# Patient Record
Sex: Male | Born: 1935
Health system: Southern US, Community
[De-identification: ages and names within clinical notes are randomized; demographics above are authoritative.]

## PROBLEM LIST (undated history)

## (undated) DIAGNOSIS — R001 Bradycardia, unspecified: Secondary | ICD-10-CM

## (undated) DIAGNOSIS — I498 Other specified cardiac arrhythmias: Secondary | ICD-10-CM

## (undated) DIAGNOSIS — R7303 Prediabetes: Secondary | ICD-10-CM

## (undated) DIAGNOSIS — K219 Gastro-esophageal reflux disease without esophagitis: Secondary | ICD-10-CM

## (undated) DIAGNOSIS — N401 Enlarged prostate with lower urinary tract symptoms: Secondary | ICD-10-CM

## (undated) DIAGNOSIS — C4491 Basal cell carcinoma of skin, unspecified: Secondary | ICD-10-CM

## (undated) DIAGNOSIS — R351 Nocturia: Secondary | ICD-10-CM

## (undated) DIAGNOSIS — Z950 Presence of cardiac pacemaker: Secondary | ICD-10-CM

## (undated) DIAGNOSIS — J180 Bronchopneumonia, unspecified organism: Secondary | ICD-10-CM

## (undated) DIAGNOSIS — N138 Other obstructive and reflux uropathy: Secondary | ICD-10-CM

## (undated) DIAGNOSIS — M5020 Other cervical disc displacement, unspecified cervical region: Secondary | ICD-10-CM

## (undated) DIAGNOSIS — R42 Dizziness and giddiness: Secondary | ICD-10-CM

## (undated) DIAGNOSIS — S32000A Wedge compression fracture of unspecified lumbar vertebra, initial encounter for closed fracture: Secondary | ICD-10-CM

## (undated) DIAGNOSIS — I4729 Other ventricular tachycardia: Secondary | ICD-10-CM

## (undated) DIAGNOSIS — R350 Frequency of micturition: Secondary | ICD-10-CM

## (undated) DIAGNOSIS — E8881 Metabolic syndrome: Secondary | ICD-10-CM

## (undated) DIAGNOSIS — I6523 Occlusion and stenosis of bilateral carotid arteries: Secondary | ICD-10-CM

## (undated) DIAGNOSIS — Z973 Presence of spectacles and contact lenses: Secondary | ICD-10-CM

## (undated) DIAGNOSIS — R5383 Other fatigue: Secondary | ICD-10-CM

## (undated) DIAGNOSIS — E039 Hypothyroidism, unspecified: Secondary | ICD-10-CM

## (undated) DIAGNOSIS — I495 Sick sinus syndrome: Secondary | ICD-10-CM

## (undated) DIAGNOSIS — I472 Ventricular tachycardia: Secondary | ICD-10-CM

## (undated) DIAGNOSIS — E785 Hyperlipidemia, unspecified: Secondary | ICD-10-CM

## (undated) DIAGNOSIS — R3915 Urgency of urination: Secondary | ICD-10-CM

## (undated) DIAGNOSIS — M1990 Unspecified osteoarthritis, unspecified site: Secondary | ICD-10-CM

## (undated) HISTORY — PX: ULNAR NERVE TRANSPOSITION: SHX2595

## (undated) HISTORY — PX: SKIN BIOPSY: SHX1

## (undated) HISTORY — PX: OTHER SURGICAL HISTORY: SHX169

## (undated) HISTORY — PX: BASAL CELL CARCINOMA EXCISION: SHX1214

## (undated) HISTORY — DX: Unspecified osteoarthritis, unspecified site: M19.90

## (undated) HISTORY — DX: Hyperlipidemia, unspecified: E78.5

---
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
## 1976-03-25 HISTORY — PX: LACERATION REPAIR: SHX5168

## 1998-04-06 ENCOUNTER — Ambulatory Visit (HOSPITAL_COMMUNITY): Admission: RE | Admit: 1998-04-06 | Discharge: 1998-04-06 | Payer: Self-pay | Admitting: Endocrinology

## 1998-04-06 ENCOUNTER — Encounter: Payer: Self-pay | Admitting: Endocrinology

## 2004-02-14 ENCOUNTER — Ambulatory Visit: Payer: Self-pay | Admitting: Internal Medicine

## 2004-02-21 ENCOUNTER — Ambulatory Visit: Payer: Self-pay | Admitting: Internal Medicine

## 2004-06-12 ENCOUNTER — Ambulatory Visit: Payer: Self-pay | Admitting: Internal Medicine

## 2004-06-19 ENCOUNTER — Ambulatory Visit: Payer: Self-pay | Admitting: Internal Medicine

## 2004-08-16 ENCOUNTER — Ambulatory Visit: Payer: Self-pay | Admitting: Internal Medicine

## 2004-08-23 ENCOUNTER — Ambulatory Visit: Payer: Self-pay | Admitting: Internal Medicine

## 2004-11-22 ENCOUNTER — Ambulatory Visit: Payer: Self-pay | Admitting: Internal Medicine

## 2005-02-05 ENCOUNTER — Ambulatory Visit: Payer: Self-pay | Admitting: Internal Medicine

## 2005-02-12 ENCOUNTER — Ambulatory Visit: Payer: Self-pay | Admitting: Internal Medicine

## 2005-02-15 ENCOUNTER — Ambulatory Visit: Payer: Self-pay | Admitting: Internal Medicine

## 2005-02-19 ENCOUNTER — Ambulatory Visit: Payer: Self-pay | Admitting: Internal Medicine

## 2005-03-06 ENCOUNTER — Ambulatory Visit: Payer: Self-pay | Admitting: Internal Medicine

## 2005-03-06 ENCOUNTER — Encounter (INDEPENDENT_AMBULATORY_CARE_PROVIDER_SITE_OTHER): Payer: Self-pay | Admitting: *Deleted

## 2005-07-31 ENCOUNTER — Ambulatory Visit: Payer: Self-pay | Admitting: Internal Medicine

## 2005-08-04 LAB — HM COLONOSCOPY

## 2005-08-06 ENCOUNTER — Ambulatory Visit: Payer: Self-pay | Admitting: Internal Medicine

## 2005-10-09 ENCOUNTER — Ambulatory Visit: Payer: Self-pay | Admitting: Internal Medicine

## 2006-01-09 ENCOUNTER — Ambulatory Visit: Payer: Self-pay | Admitting: Internal Medicine

## 2006-02-20 ENCOUNTER — Ambulatory Visit: Payer: Self-pay | Admitting: Internal Medicine

## 2006-02-20 LAB — CONVERTED CEMR LAB
ALT: 36 units/L (ref 0–40)
AST: 34 units/L (ref 0–37)
Albumin: 4.4 g/dL (ref 3.5–5.2)
Alkaline Phosphatase: 38 units/L — ABNORMAL LOW (ref 39–117)
BUN: 10 mg/dL (ref 6–23)
Basophils Absolute: 0 10*3/uL (ref 0.0–0.1)
Basophils Relative: 0.6 % (ref 0.0–1.0)
CO2: 28 meq/L (ref 19–32)
Calcium: 9.4 mg/dL (ref 8.4–10.5)
Chloride: 103 meq/L (ref 96–112)
Chol/HDL Ratio, serum: 3.4
Cholesterol: 127 mg/dL (ref 0–200)
Creatinine, Ser: 0.9 mg/dL (ref 0.4–1.5)
Eosinophil percent: 1.5 % (ref 0.0–5.0)
GFR calc non Af Amer: 89 mL/min
Glomerular Filtration Rate, Af Am: 108 mL/min/{1.73_m2}
Glucose, Bld: 108 mg/dL — ABNORMAL HIGH (ref 70–99)
HCT: 46.4 % (ref 39.0–52.0)
HDL: 37.3 mg/dL — ABNORMAL LOW (ref >39.0)
Hemoglobin: 15.8 g/dL (ref 13.0–17.0)
LDL Cholesterol: 73 mg/dL (ref 0–99)
Lymphocytes Relative: 29.1 % (ref 12.0–46.0)
MCHC: 34 g/dL (ref 30.0–36.0)
MCV: 102.6 fL — ABNORMAL HIGH (ref 78.0–100.0)
Monocytes Absolute: 0.6 10*3/uL (ref 0.2–0.7)
Monocytes Relative: 11 % (ref 3.0–11.0)
Neutro Abs: 3.1 10*3/uL (ref 1.4–7.7)
Neutrophils Relative %: 57.8 % (ref 43.0–77.0)
PSA: 0.83 ng/mL (ref 0.10–4.00)
Platelets: 187 10*3/uL (ref 150–400)
Potassium: 4.4 meq/L (ref 3.5–5.1)
RBC: 4.52 M/uL (ref 4.22–5.81)
RDW: 12.5 % (ref 11.5–14.6)
Sodium: 138 meq/L (ref 135–145)
TSH: 4.18 microintl units/mL (ref 0.35–5.50)
Total Bilirubin: 1 mg/dL (ref 0.3–1.2)
Total Protein: 6.8 g/dL (ref 6.0–8.3)
Triglyceride fasting, serum: 83 mg/dL (ref 0–149)
VLDL: 17 mg/dL (ref 0–40)
WBC: 5.3 10*3/uL (ref 4.5–10.5)

## 2006-02-27 ENCOUNTER — Ambulatory Visit: Payer: Self-pay | Admitting: Internal Medicine

## 2006-07-10 ENCOUNTER — Ambulatory Visit: Payer: Self-pay | Admitting: Internal Medicine

## 2006-07-10 LAB — CONVERTED CEMR LAB
ALT: 50 units/L — ABNORMAL HIGH (ref 0–40)
AST: 40 units/L — ABNORMAL HIGH (ref 0–37)
Albumin: 4.3 g/dL (ref 3.5–5.2)
Alkaline Phosphatase: 41 units/L (ref 39–117)
Bilirubin, Direct: 0.2 mg/dL (ref 0.0–0.3)
Cholesterol: 133 mg/dL (ref 0–200)
HDL: 37.3 mg/dL — ABNORMAL LOW (ref >39.0)
Hgb A1c MFr Bld: 5.7 % (ref 4.6–6.0)
LDL Cholesterol: 78 mg/dL (ref 0–99)
TSH: 2.63 microintl units/mL (ref 0.35–5.50)
Total Bilirubin: 1 mg/dL (ref 0.3–1.2)
Total CHOL/HDL Ratio: 3.6
Total Protein: 7.2 g/dL (ref 6.0–8.3)
Triglycerides: 88 mg/dL (ref 0–149)
VLDL: 18 mg/dL (ref 0–40)

## 2006-09-18 DIAGNOSIS — E785 Hyperlipidemia, unspecified: Secondary | ICD-10-CM | POA: Insufficient documentation

## 2006-09-18 DIAGNOSIS — N318 Other neuromuscular dysfunction of bladder: Secondary | ICD-10-CM | POA: Insufficient documentation

## 2006-10-07 ENCOUNTER — Ambulatory Visit: Payer: Self-pay | Admitting: Internal Medicine

## 2006-10-07 LAB — CONVERTED CEMR LAB
Creatinine,U: 101.2 mg/dL
Hgb A1c MFr Bld: 5.5 % (ref 4.6–6.0)
Microalb Creat Ratio: 4 mg/g (ref 0.0–30.0)
Microalb, Ur: 0.4 mg/dL (ref 0.0–1.9)

## 2006-10-14 ENCOUNTER — Ambulatory Visit: Payer: Self-pay | Admitting: Internal Medicine

## 2006-10-14 DIAGNOSIS — E039 Hypothyroidism, unspecified: Secondary | ICD-10-CM | POA: Insufficient documentation

## 2006-10-14 DIAGNOSIS — K648 Other hemorrhoids: Secondary | ICD-10-CM

## 2006-11-14 ENCOUNTER — Encounter: Payer: Self-pay | Admitting: Internal Medicine

## 2006-12-16 ENCOUNTER — Encounter: Payer: Self-pay | Admitting: Internal Medicine

## 2007-01-02 ENCOUNTER — Ambulatory Visit: Payer: Self-pay | Admitting: Internal Medicine

## 2007-02-03 ENCOUNTER — Ambulatory Visit: Payer: Self-pay | Admitting: Internal Medicine

## 2007-02-03 LAB — CONVERTED CEMR LAB
ALT: 42 units/L (ref 0–53)
AST: 34 units/L (ref 0–37)
Albumin: 4.2 g/dL (ref 3.5–5.2)
Alkaline Phosphatase: 37 units/L — ABNORMAL LOW (ref 39–117)
BUN: 9 mg/dL (ref 6–23)
Basophils Absolute: 0 10*3/uL (ref 0.0–0.1)
Basophils Relative: 0.5 % (ref 0.0–1.0)
Bilirubin, Direct: 0.3 mg/dL (ref 0.0–0.3)
Blood in Urine, dipstick: NEGATIVE
CO2: 29 meq/L (ref 19–32)
Calcium: 9.6 mg/dL (ref 8.4–10.5)
Chloride: 104 meq/L (ref 96–112)
Cholesterol: 108 mg/dL (ref 0–200)
Creatinine, Ser: 0.8 mg/dL (ref 0.4–1.5)
Eosinophils Absolute: 0.1 10*3/uL (ref 0.0–0.6)
Eosinophils Relative: 1.4 % (ref 0.0–5.0)
GFR calc Af Amer: 123 mL/min
GFR calc non Af Amer: 102 mL/min
Glucose, Bld: 111 mg/dL — ABNORMAL HIGH (ref 70–99)
Glucose, Urine, Semiquant: NEGATIVE
HCT: 43.7 % (ref 39.0–52.0)
HDL: 26.4 mg/dL — ABNORMAL LOW (ref >39.0)
Hemoglobin: 15.5 g/dL (ref 13.0–17.0)
Ketones, urine, test strip: NEGATIVE
LDL Cholesterol: 65 mg/dL (ref 0–99)
Lymphocytes Relative: 25.2 % (ref 12.0–46.0)
MCHC: 35.5 g/dL (ref 30.0–36.0)
MCV: 102 fL — ABNORMAL HIGH (ref 78.0–100.0)
Monocytes Absolute: 0.7 10*3/uL (ref 0.2–0.7)
Monocytes Relative: 11.5 % — ABNORMAL HIGH (ref 3.0–11.0)
Neutro Abs: 3.5 10*3/uL (ref 1.4–7.7)
Neutrophils Relative %: 61.4 % (ref 43.0–77.0)
Nitrite: NEGATIVE
PSA: 0.51 ng/mL (ref 0.10–4.00)
Platelets: 181 10*3/uL (ref 150–400)
Potassium: 4.8 meq/L (ref 3.5–5.1)
Protein, U semiquant: NEGATIVE
RBC: 4.29 M/uL (ref 4.22–5.81)
RDW: 12.1 % (ref 11.5–14.6)
Sodium: 140 meq/L (ref 135–145)
Specific Gravity, Urine: 1.02
TSH: 4.23 microintl units/mL (ref 0.35–5.50)
Total Bilirubin: 1.1 mg/dL (ref 0.3–1.2)
Total CHOL/HDL Ratio: 4.1
Total Protein: 6.9 g/dL (ref 6.0–8.3)
Triglycerides: 85 mg/dL (ref 0–149)
Urobilinogen, UA: 1
VLDL: 17 mg/dL (ref 0–40)
WBC Urine, dipstick: NEGATIVE
WBC: 5.7 10*3/uL (ref 4.5–10.5)
pH: 5.5

## 2007-02-10 ENCOUNTER — Ambulatory Visit: Payer: Self-pay | Admitting: Internal Medicine

## 2007-02-10 DIAGNOSIS — M545 Low back pain, unspecified: Secondary | ICD-10-CM | POA: Insufficient documentation

## 2007-02-10 DIAGNOSIS — F32A Depression, unspecified: Secondary | ICD-10-CM | POA: Insufficient documentation

## 2007-02-10 DIAGNOSIS — F329 Major depressive disorder, single episode, unspecified: Secondary | ICD-10-CM

## 2007-02-10 DIAGNOSIS — F419 Anxiety disorder, unspecified: Secondary | ICD-10-CM

## 2007-02-10 DIAGNOSIS — N401 Enlarged prostate with lower urinary tract symptoms: Secondary | ICD-10-CM | POA: Insufficient documentation

## 2007-02-10 DIAGNOSIS — N400 Enlarged prostate without lower urinary tract symptoms: Secondary | ICD-10-CM | POA: Insufficient documentation

## 2007-02-10 DIAGNOSIS — M1990 Unspecified osteoarthritis, unspecified site: Secondary | ICD-10-CM | POA: Insufficient documentation

## 2007-02-10 DIAGNOSIS — R7309 Other abnormal glucose: Secondary | ICD-10-CM | POA: Insufficient documentation

## 2007-04-15 ENCOUNTER — Encounter: Payer: Self-pay | Admitting: Internal Medicine

## 2007-04-23 ENCOUNTER — Ambulatory Visit: Payer: Self-pay | Admitting: Internal Medicine

## 2007-04-23 DIAGNOSIS — E291 Testicular hypofunction: Secondary | ICD-10-CM | POA: Insufficient documentation

## 2007-04-23 LAB — CONVERTED CEMR LAB
Cholesterol, target level: 200 mg/dL
HDL goal, serum: 40 mg/dL
LDL Goal: 130 mg/dL
Testosterone: 401.69 ng/dL (ref 350.00–890)

## 2007-06-18 ENCOUNTER — Ambulatory Visit: Payer: Self-pay | Admitting: Internal Medicine

## 2007-06-18 LAB — CONVERTED CEMR LAB: TSH: 3.44 microintl units/mL (ref 0.35–5.50)

## 2007-08-22 ENCOUNTER — Emergency Department (HOSPITAL_COMMUNITY): Admission: EM | Admit: 2007-08-22 | Discharge: 2007-08-22 | Payer: Self-pay | Admitting: Emergency Medicine

## 2007-08-24 ENCOUNTER — Ambulatory Visit: Payer: Self-pay | Admitting: Internal Medicine

## 2007-08-24 DIAGNOSIS — S022XXA Fracture of nasal bones, initial encounter for closed fracture: Secondary | ICD-10-CM | POA: Insufficient documentation

## 2007-08-25 ENCOUNTER — Telehealth (INDEPENDENT_AMBULATORY_CARE_PROVIDER_SITE_OTHER): Payer: Self-pay

## 2007-08-31 ENCOUNTER — Encounter: Payer: Self-pay | Admitting: Internal Medicine

## 2007-09-01 ENCOUNTER — Ambulatory Visit (HOSPITAL_BASED_OUTPATIENT_CLINIC_OR_DEPARTMENT_OTHER): Admission: RE | Admit: 2007-09-01 | Discharge: 2007-09-01 | Payer: Self-pay | Admitting: Otolaryngology

## 2007-09-01 HISTORY — PX: CLOSED REDUCTION NASAL FRACTURE: SUR256

## 2007-09-10 ENCOUNTER — Ambulatory Visit: Payer: Self-pay | Admitting: Internal Medicine

## 2007-09-10 LAB — CONVERTED CEMR LAB
ALT: 54 units/L — ABNORMAL HIGH (ref 0–53)
AST: 44 units/L — ABNORMAL HIGH (ref 0–37)
Albumin: 4 g/dL (ref 3.5–5.2)
Alkaline Phosphatase: 37 units/L — ABNORMAL LOW (ref 39–117)
BUN: 10 mg/dL (ref 6–23)
Bilirubin, Direct: 0.1 mg/dL (ref 0.0–0.3)
CO2: 27 meq/L (ref 19–32)
Calcium: 9.1 mg/dL (ref 8.4–10.5)
Chloride: 105 meq/L (ref 96–112)
Cholesterol: 111 mg/dL (ref 0–200)
Creatinine, Ser: 0.8 mg/dL (ref 0.4–1.5)
GFR calc Af Amer: 123 mL/min
GFR calc non Af Amer: 102 mL/min
Glucose, Bld: 116 mg/dL — ABNORMAL HIGH (ref 70–99)
HDL: 25.3 mg/dL — ABNORMAL LOW (ref >39.0)
LDL Cholesterol: 57 mg/dL (ref 0–99)
Potassium: 3.8 meq/L (ref 3.5–5.1)
Sodium: 140 meq/L (ref 135–145)
Total Bilirubin: 1 mg/dL (ref 0.3–1.2)
Total CHOL/HDL Ratio: 4.4
Total Protein: 6.6 g/dL (ref 6.0–8.3)
Triglycerides: 141 mg/dL (ref 0–149)
VLDL: 28 mg/dL (ref 0–40)

## 2007-09-17 ENCOUNTER — Ambulatory Visit: Payer: Self-pay | Admitting: Internal Medicine

## 2007-09-17 DIAGNOSIS — T887XXA Unspecified adverse effect of drug or medicament, initial encounter: Secondary | ICD-10-CM | POA: Insufficient documentation

## 2007-11-12 ENCOUNTER — Ambulatory Visit: Payer: Self-pay | Admitting: Internal Medicine

## 2007-11-12 LAB — CONVERTED CEMR LAB
ALT: 59 units/L — ABNORMAL HIGH (ref 0–53)
AST: 41 units/L — ABNORMAL HIGH (ref 0–37)
Albumin: 3.9 g/dL (ref 3.5–5.2)
Alkaline Phosphatase: 34 units/L — ABNORMAL LOW (ref 39–117)
Bilirubin, Direct: 0.2 mg/dL (ref 0.0–0.3)
Cholesterol: 86 mg/dL (ref 0–200)
HDL: 23.7 mg/dL — ABNORMAL LOW (ref >39.0)
LDL Cholesterol: 47 mg/dL (ref 0–99)
Total Bilirubin: 0.8 mg/dL (ref 0.3–1.2)
Total CHOL/HDL Ratio: 3.6
Total Protein: 6.4 g/dL (ref 6.0–8.3)
Triglycerides: 79 mg/dL (ref 0–149)
VLDL: 16 mg/dL (ref 0–40)

## 2007-11-19 ENCOUNTER — Ambulatory Visit: Payer: Self-pay | Admitting: Internal Medicine

## 2007-12-15 ENCOUNTER — Ambulatory Visit: Payer: Self-pay | Admitting: Internal Medicine

## 2007-12-17 ENCOUNTER — Encounter: Payer: Self-pay | Admitting: Internal Medicine

## 2008-01-25 ENCOUNTER — Ambulatory Visit: Payer: Self-pay | Admitting: Internal Medicine

## 2008-02-16 ENCOUNTER — Ambulatory Visit: Payer: Self-pay | Admitting: Internal Medicine

## 2008-02-16 LAB — CONVERTED CEMR LAB
ALT: 38 units/L (ref 0–53)
AST: 37 units/L (ref 0–37)
Albumin: 4.1 g/dL (ref 3.5–5.2)
Alkaline Phosphatase: 43 units/L (ref 39–117)
Bilirubin, Direct: 0.2 mg/dL (ref 0.0–0.3)
Cholesterol: 139 mg/dL (ref 0–200)
HDL: 29.9 mg/dL — ABNORMAL LOW (ref >39.0)
Hgb A1c MFr Bld: 5.6 % (ref 4.6–6.0)
LDL Cholesterol: 92 mg/dL (ref 0–99)
Total Bilirubin: 0.9 mg/dL (ref 0.3–1.2)
Total CHOL/HDL Ratio: 4.6
Total Protein: 6.7 g/dL (ref 6.0–8.3)
Triglycerides: 88 mg/dL (ref 0–149)
VLDL: 18 mg/dL (ref 0–40)

## 2008-02-23 ENCOUNTER — Ambulatory Visit: Payer: Self-pay | Admitting: Internal Medicine

## 2008-03-26 ENCOUNTER — Ambulatory Visit: Payer: Self-pay | Admitting: Internal Medicine

## 2008-03-26 ENCOUNTER — Telehealth: Payer: Self-pay | Admitting: Internal Medicine

## 2008-03-26 DIAGNOSIS — J302 Other seasonal allergic rhinitis: Secondary | ICD-10-CM | POA: Insufficient documentation

## 2008-03-26 DIAGNOSIS — J3089 Other allergic rhinitis: Secondary | ICD-10-CM

## 2008-09-08 ENCOUNTER — Ambulatory Visit: Payer: Self-pay | Admitting: Internal Medicine

## 2008-09-12 LAB — CONVERTED CEMR LAB
ALT: 43 units/L (ref 0–53)
AST: 39 units/L — ABNORMAL HIGH (ref 0–37)
Albumin: 4.3 g/dL (ref 3.5–5.2)
Alkaline Phosphatase: 33 units/L — ABNORMAL LOW (ref 39–117)
BUN: 14 mg/dL (ref 6–23)
Basophils Absolute: 0 10*3/uL (ref 0.0–0.1)
Basophils Relative: 0.4 % (ref 0.0–3.0)
Bilirubin, Direct: 0.1 mg/dL (ref 0.0–0.3)
CO2: 29 meq/L (ref 19–32)
Calcium: 9.2 mg/dL (ref 8.4–10.5)
Chloride: 104 meq/L (ref 96–112)
Cholesterol: 135 mg/dL (ref 0–200)
Creatinine, Ser: 0.8 mg/dL (ref 0.4–1.5)
Eosinophils Absolute: 0 10*3/uL (ref 0.0–0.7)
Eosinophils Relative: 0.7 % (ref 0.0–5.0)
GFR calc non Af Amer: 101.25 mL/min (ref >60)
Glucose, Bld: 104 mg/dL — ABNORMAL HIGH (ref 70–99)
HCT: 44.2 % (ref 39.0–52.0)
HDL: 33.9 mg/dL — ABNORMAL LOW (ref >39.00)
Hemoglobin: 15.2 g/dL (ref 13.0–17.0)
LDL Cholesterol: 79 mg/dL (ref 0–99)
Lymphocytes Relative: 22.3 % (ref 12.0–46.0)
Lymphs Abs: 1.3 10*3/uL (ref 0.7–4.0)
MCHC: 34.5 g/dL (ref 30.0–36.0)
MCV: 104.2 fL — ABNORMAL HIGH (ref 78.0–100.0)
Monocytes Absolute: 0.6 10*3/uL (ref 0.1–1.0)
Monocytes Relative: 10.3 % (ref 3.0–12.0)
Neutro Abs: 4.1 10*3/uL (ref 1.4–7.7)
Neutrophils Relative %: 66.3 % (ref 43.0–77.0)
PSA: 0.66 ng/mL (ref 0.10–4.00)
Platelets: 89 10*3/uL — ABNORMAL LOW (ref 150.0–400.0)
Potassium: 4 meq/L (ref 3.5–5.1)
RBC: 4.24 M/uL (ref 4.22–5.81)
RDW: 12.7 % (ref 11.5–14.6)
Sodium: 139 meq/L (ref 135–145)
TSH: 4.32 microintl units/mL (ref 0.35–5.50)
Testosterone: 371.18 ng/dL (ref 350.00–890.00)
Total Bilirubin: 1.3 mg/dL — ABNORMAL HIGH (ref 0.3–1.2)
Total CHOL/HDL Ratio: 4
Total Protein: 7.2 g/dL (ref 6.0–8.3)
Triglycerides: 109 mg/dL (ref 0.0–149.0)
VLDL: 21.8 mg/dL (ref 0.0–40.0)
WBC: 6 10*3/uL (ref 4.5–10.5)

## 2008-09-24 ENCOUNTER — Telehealth (INDEPENDENT_AMBULATORY_CARE_PROVIDER_SITE_OTHER): Payer: Self-pay | Admitting: *Deleted

## 2008-10-10 ENCOUNTER — Ambulatory Visit: Payer: Self-pay | Admitting: Internal Medicine

## 2008-10-10 DIAGNOSIS — E162 Hypoglycemia, unspecified: Secondary | ICD-10-CM | POA: Insufficient documentation

## 2009-01-11 ENCOUNTER — Ambulatory Visit: Payer: Self-pay | Admitting: Internal Medicine

## 2009-03-02 ENCOUNTER — Ambulatory Visit: Payer: Self-pay | Admitting: Internal Medicine

## 2009-03-02 LAB — CONVERTED CEMR LAB
ALT: 36 units/L (ref 0–53)
AST: 34 units/L (ref 0–37)
Albumin: 3.9 g/dL (ref 3.5–5.2)
Alkaline Phosphatase: 30 units/L — ABNORMAL LOW (ref 39–117)
Bilirubin, Direct: 0.1 mg/dL (ref 0.0–0.3)
Cholesterol: 116 mg/dL (ref 0–200)
HDL: 26 mg/dL — ABNORMAL LOW (ref >39.00)
LDL Cholesterol: 70 mg/dL (ref 0–99)
Total Bilirubin: 1.2 mg/dL (ref 0.3–1.2)
Total CHOL/HDL Ratio: 4
Total Protein: 7 g/dL (ref 6.0–8.3)
Triglycerides: 102 mg/dL (ref 0.0–149.0)
VLDL: 20.4 mg/dL (ref 0.0–40.0)

## 2009-03-22 ENCOUNTER — Ambulatory Visit: Payer: Self-pay | Admitting: Internal Medicine

## 2009-03-23 ENCOUNTER — Encounter: Payer: Self-pay | Admitting: Internal Medicine

## 2009-03-27 LAB — CONVERTED CEMR LAB
PSA, Free Pct: 40 (ref >25)
PSA, Free: 0.4 ng/mL
PSA: 0.99 ng/mL (ref 0.10–4.00)

## 2009-08-23 ENCOUNTER — Telehealth: Payer: Self-pay | Admitting: Internal Medicine

## 2009-09-22 ENCOUNTER — Telehealth: Payer: Self-pay | Admitting: Internal Medicine

## 2009-09-26 ENCOUNTER — Ambulatory Visit: Payer: Self-pay | Admitting: Internal Medicine

## 2009-09-26 LAB — CONVERTED CEMR LAB
ALT: 47 units/L (ref 0–53)
AST: 38 units/L — ABNORMAL HIGH (ref 0–37)
Albumin: 4.1 g/dL (ref 3.5–5.2)
Alkaline Phosphatase: 31 units/L — ABNORMAL LOW (ref 39–117)
BUN: 12 mg/dL (ref 6–23)
Basophils Absolute: 0 10*3/uL (ref 0.0–0.1)
Basophils Relative: 0.4 % (ref 0.0–3.0)
Bilirubin Urine: NEGATIVE
Bilirubin, Direct: 0.2 mg/dL (ref 0.0–0.3)
CO2: 26 meq/L (ref 19–32)
Calcium: 9 mg/dL (ref 8.4–10.5)
Chloride: 108 meq/L (ref 96–112)
Cholesterol: 99 mg/dL (ref 0–200)
Creatinine, Ser: 0.7 mg/dL (ref 0.4–1.5)
Eosinophils Absolute: 0.1 10*3/uL (ref 0.0–0.7)
Eosinophils Relative: 1.5 % (ref 0.0–5.0)
GFR calc non Af Amer: 121.77 mL/min (ref >60)
Glucose, Bld: 127 mg/dL — ABNORMAL HIGH (ref 70–99)
HCT: 49.5 % (ref 39.0–52.0)
HDL: 25.4 mg/dL — ABNORMAL LOW (ref >39.00)
Hemoglobin: 17.6 g/dL — ABNORMAL HIGH (ref 13.0–17.0)
Ketones, ur: NEGATIVE mg/dL
LDL Cholesterol: 51 mg/dL (ref 0–99)
Leukocytes, UA: NEGATIVE
Lymphocytes Relative: 29.8 % (ref 12.0–46.0)
Lymphs Abs: 2 10*3/uL (ref 0.7–4.0)
MCHC: 35.6 g/dL (ref 30.0–36.0)
MCV: 104.1 fL — ABNORMAL HIGH (ref 78.0–100.0)
Monocytes Absolute: 0.7 10*3/uL (ref 0.1–1.0)
Monocytes Relative: 10.8 % (ref 3.0–12.0)
Neutro Abs: 3.8 10*3/uL (ref 1.4–7.7)
Neutrophils Relative %: 57.5 % (ref 43.0–77.0)
Nitrite: NEGATIVE
PSA: 0.77 ng/mL (ref 0.10–4.00)
Platelets: 161 10*3/uL (ref 150.0–400.0)
Potassium: 4 meq/L (ref 3.5–5.1)
RBC: 4.76 M/uL (ref 4.22–5.81)
RDW: 13.4 % (ref 11.5–14.6)
Sodium: 139 meq/L (ref 135–145)
Specific Gravity, Urine: 1.025 (ref 1.000–1.030)
TSH: 5.99 microintl units/mL — ABNORMAL HIGH (ref 0.35–5.50)
Testosterone: 1329.89 ng/dL — ABNORMAL HIGH (ref 350.00–890.00)
Total Bilirubin: 0.7 mg/dL (ref 0.3–1.2)
Total CHOL/HDL Ratio: 4
Total Protein, Urine: NEGATIVE mg/dL
Total Protein: 6.8 g/dL (ref 6.0–8.3)
Triglycerides: 113 mg/dL (ref 0.0–149.0)
Urine Glucose: NEGATIVE mg/dL
Urobilinogen, UA: 0.2 (ref 0.0–1.0)
VLDL: 22.6 mg/dL (ref 0.0–40.0)
WBC: 6.6 10*3/uL (ref 4.5–10.5)
pH: 6 (ref 5.0–8.0)

## 2009-10-13 ENCOUNTER — Ambulatory Visit: Payer: Self-pay | Admitting: Internal Medicine

## 2009-10-13 DIAGNOSIS — E8881 Metabolic syndrome: Secondary | ICD-10-CM | POA: Insufficient documentation

## 2009-12-15 ENCOUNTER — Telehealth: Payer: Self-pay | Admitting: Internal Medicine

## 2009-12-18 ENCOUNTER — Telehealth: Payer: Self-pay | Admitting: Internal Medicine

## 2009-12-18 DIAGNOSIS — M79609 Pain in unspecified limb: Secondary | ICD-10-CM | POA: Insufficient documentation

## 2009-12-18 DIAGNOSIS — H9190 Unspecified hearing loss, unspecified ear: Secondary | ICD-10-CM | POA: Insufficient documentation

## 2009-12-22 ENCOUNTER — Encounter: Payer: Self-pay | Admitting: Internal Medicine

## 2009-12-26 ENCOUNTER — Encounter: Payer: Self-pay | Admitting: Internal Medicine

## 2010-01-02 ENCOUNTER — Ambulatory Visit: Payer: Self-pay | Admitting: Internal Medicine

## 2010-01-02 LAB — CONVERTED CEMR LAB
Anti Nuclear Antibody(ANA): NEGATIVE
Basophils Absolute: 0 10*3/uL (ref 0.0–0.1)
Basophils Relative: 0.4 % (ref 0.0–3.0)
CRP, High Sensitivity: 0.49 (ref 0.00–5.00)
Eosinophils Absolute: 0.1 10*3/uL (ref 0.0–0.7)
Eosinophils Relative: 1.4 % (ref 0.0–5.0)
HCT: 46.9 % (ref 39.0–52.0)
Hemoglobin: 16.2 g/dL (ref 13.0–17.0)
Lymphocytes Relative: 28.2 % (ref 12.0–46.0)
Lymphs Abs: 1.5 10*3/uL (ref 0.7–4.0)
MCHC: 34.5 g/dL (ref 30.0–36.0)
MCV: 106.4 fL — ABNORMAL HIGH (ref 78.0–100.0)
Monocytes Absolute: 0.5 10*3/uL (ref 0.1–1.0)
Monocytes Relative: 8.4 % (ref 3.0–12.0)
Neutro Abs: 3.3 10*3/uL (ref 1.4–7.7)
Neutrophils Relative %: 61.6 % (ref 43.0–77.0)
Platelets: 159 10*3/uL (ref 150.0–400.0)
RBC: 4.41 M/uL (ref 4.22–5.81)
RDW: 14 % (ref 11.5–14.6)
Rhuematoid fact SerPl-aCnc: <20 intl units/mL (ref 0–20)
Sed Rate: 4 mm/hr (ref 0–22)
TSH: 2.66 microintl units/mL (ref 0.35–5.50)
Testosterone: 570.1 ng/dL (ref 350.00–890.00)
Uric Acid, Serum: 5.7 mg/dL (ref 4.0–7.8)
WBC: 5.4 10*3/uL (ref 4.5–10.5)

## 2010-01-08 ENCOUNTER — Telehealth: Payer: Self-pay

## 2010-01-23 ENCOUNTER — Ambulatory Visit: Payer: Self-pay | Admitting: Internal Medicine

## 2010-01-23 DIAGNOSIS — G5600 Carpal tunnel syndrome, unspecified upper limb: Secondary | ICD-10-CM | POA: Insufficient documentation

## 2010-01-24 ENCOUNTER — Ambulatory Visit: Payer: Self-pay | Admitting: Internal Medicine

## 2010-02-07 ENCOUNTER — Encounter: Admission: RE | Admit: 2010-02-07 | Discharge: 2010-02-07 | Payer: Self-pay | Admitting: Orthopedic Surgery

## 2010-02-09 ENCOUNTER — Ambulatory Visit (HOSPITAL_BASED_OUTPATIENT_CLINIC_OR_DEPARTMENT_OTHER): Admission: RE | Admit: 2010-02-09 | Discharge: 2010-02-09 | Payer: Self-pay | Admitting: Orthopedic Surgery

## 2010-02-09 HISTORY — PX: NEUROPLASTY / TRANSPOSITION ULNAR NERVE AT ELBOW: SUR895

## 2010-02-26 ENCOUNTER — Encounter: Payer: Self-pay | Admitting: Internal Medicine

## 2010-04-03 ENCOUNTER — Ambulatory Visit
Admission: RE | Admit: 2010-04-03 | Discharge: 2010-04-03 | Payer: Self-pay | Source: Home / Self Care | Attending: Internal Medicine | Admitting: Internal Medicine

## 2010-04-24 NOTE — Assessment & Plan Note (Signed)
Summary: 3 MONTH ROA/JLS   Vital Signs:  Patient Profile:   75 Years Old Male Height:     69 inches Weight:      182 pounds Temp:     98 degrees F oral Pulse rate:   72 / minute Resp:     12 per minute BP sitting:   118 / 70  (left arm)  Vitals Entered By: Willy Eddy, LPN (October 14, 2006 8:14 AM)               Chief Complaint:  71 year wm 3 month f/u.  History of Present Illness: This is a follow-up visit for Stephen Robbins.  He is adult onset diabetes was formerly treated with Actos.  For the past.  Interval he has tried diet, exercise and weight loss to control his diabetes with the success of lowering his A1c5.4.  Will remove adult onset diabetes from his active problem list.  A microalbumin test revealed normal renal function.  Creatinine has been stable.  His active problem list becomes hyperlipidemic, hypothyroidism, overactive bladder.  Current Allergies: PCN  Past Medical History:    Reviewed history and no changes required:       Hypothyroidism       compression fracture       hyperlipidemia       hemorrhiods       diabetes type II       overactive bladder  Past Surgical History:    Reviewed history and no changes required:       index finger right hand       Tonsillectomy   Family History:    father died from CVA at 10    Mother    Family History Lung cancer  Social History:    Retired    Married    Former Smoker quit for 40 years    Alcohol use-yes    Drug use-no    Regular exercise-yes   Risk Factors:  Tobacco use:  quit    Year quit:  1970    Pack-years:  2 Passive smoke exposure:  no Drug use:  no HIV high-risk behavior:  no Caffeine use:  0 drinks per day Alcohol use:  yes    Drinks per day:  1    Has patient --       Felt need to cut down:  no       Been annoyed by complaints:  no       Felt guilty about drinking:  no       Needed eye opener in the morning:  no    Counseled to quit/cut down alcohol use:  no Exercise:  yes   Times per week:  4    Type:  gym Seatbelt use:  100 %  Family History Risk Factors:    Family History of MI in females < 50 years old:  no    Family History of MI in males < 66 years old:  no   Review of Systems  The patient denies anorexia, fever, weight loss, vision loss, decreased hearing, hoarseness, chest pain, syncope, dyspnea on exhertion, peripheral edema, prolonged cough, hemoptysis, abdominal pain, melena, hematochezia, severe indigestion/heartburn, hematuria, incontinence, genital sores, muscle weakness, suspicious skin lesions, transient blindness, difficulty walking, depression, unusual weight change, abnormal bleeding, enlarged lymph nodes, angioedema, breast masses, and testicular masses.         Primary complaint is anxiety over the stock market.   Physical Exam  General:     Well-developed,well-nourished,in no acute distress; alert,appropriate and cooperative throughout examination Head:     Normocephalic and atraumatic without obvious abnormalities. No apparent alopecia or balding. Eyes:     No corneal or conjunctival inflammation noted. EOMI. Perrla. Funduscopic exam benign, without hemorrhages, exudates or papilledema. Vision grossly normal. Nose:     no nasal discharge.   Mouth:     pharynx pink and moist.   Neck:     supple.   Lungs:     normal respiratory effort and normal breath sounds.   Heart:     normal rate and regular rhythm.   Abdomen:     soft, non-tender, and normal bowel sounds.   Prostate:     no gland enlargement.   Extremities:     No clubbing, cyanosis, edema, or deformity noted with normal full range of motion of all joints.   Neurologic:     alert & oriented X3.   Psych:     Oriented X3 and memory intact for recent and remote.       Impression & Recommendations:  Problem # 1:  HYPERLIPIDEMIA (ICD-272.4) Assessment: Improved  His updated medication list for this problem includes:    Vytorin 10-20 Mg Tabs  (Ezetimibe-simvastatin) ..... One by mouth daily  Labs Reviewed: Chol: 133 (07/10/2006)   HDL: 37.3 (07/10/2006)   LDL: 78 (07/10/2006)   TG: 88 (07/10/2006) SGOT: 40 (07/10/2006)   SGPT: 50 (07/10/2006)   Problem # 2:  HYPOTHYROIDISM (ICD-244.9) Assessment: Improved  His updated medication list for this problem includes:    Synthroid 25 Mcg Tabs (Levothyroxine sodium) .Marland Kitchen... 1 tablet by mouth once a day  Labs Reviewed: TSH: 2.63 (07/10/2006)    HgBA1c: 5.5 (10/07/2006) Chol: 133 (07/10/2006)   HDL: 37.3 (07/10/2006)   LDL: 78 (07/10/2006)   TG: 88 (07/10/2006)   Problem # 3:  EXAMINATION, ROUTINE MEDICAL (ICD-V70.0)  patient has achieved glycemic control through diet and exercise, and no longer requires medications.  We would recommend periodic reassessment of an A1c to assure continued control.  The patient is experiencing mild to moderate anxiety based upon current economic conditions and a temporary prescription for Xanax will be provided.  The sleep and that we will label and in no Reviewed preventive care protocols, scheduled due services, and updated immunizations.   Medications Added to Medication List This Visit: 1)  Folic Acid 1 Mg Tabs (Folic acid) .... One by mouth daily 2)  Vytorin 10-20 Mg Tabs (Ezetimibe-simvastatin) .... One by mouth daily 3)  Alprazolam 0.25 Mg Tabs (Alprazolam) .... One by mouth q 4-6 hours prn     Prescriptions: ALPRAZOLAM 0.25 MG  TABS (ALPRAZOLAM) one by mouth q 4-6 hours prn  #30 x 0   Entered and Authorized by:   Stacie Glaze MD   Signed by:   Stacie Glaze MD on 10/14/2006   Method used:   Print then Give to Patient   RxID:   [id number] VYTORIN 10-20 MG TABS (EZETIMIBE-SIMVASTATIN) one by mouth daily  #30 x 11   Entered and Authorized by:   Stacie Glaze MD   Signed by:   Stacie Glaze MD on 10/14/2006   Method used:   Print then Give to Patient   RxID:   [id number] SYNTHROID 25 MCG TABS (LEVOTHYROXINE SODIUM) 1  tablet by mouth once a day  #30 x 11   Entered and Authorized by:   Stacie Glaze MD   Signed by:  Stacie Glaze MD on 10/14/2006   Method used:   Print then Give to Patient   RxID:   [id number] PROSCAR 5 MG TABS (FINASTERIDE) Take 1 tablet by mouth once a day  #30 x 11   Entered and Authorized by:   Stacie Glaze MD   Signed by:   Stacie Glaze MD on 10/14/2006   Method used:   Print then Give to Patient   RxID:   [id number] FOLIC ACID 1 MG TABS (FOLIC ACID) one by mouth daily  #30 x 11   Entered and Authorized by:   Stacie Glaze MD   Signed by:   Stacie Glaze MD on 10/14/2006   Method used:   Print then Give to Patient   RxID:   [id number]

## 2010-04-24 NOTE — Assessment & Plan Note (Signed)
Summary: 6 month rov/njr   Vital Signs:  Patient profile:   75 year old male Height:      69 inches Weight:      189 pounds BMI:     28.01 Temp:     98.2 degrees F oral Pulse rate:   72 / minute Resp:     14 per minute BP sitting:   142 / 80  (left arm)  Vitals Entered By: Willy Eddy, LPN (March 22, 2009 12:13 PM) CC: roa labs--questioing whether to take calcium   CC:  roa labs--questioing whether to take calcium.  History of Present Illness:  Follow-Up Visit      This is a 75 year old man who presents for Follow-up visit.  monitering of PSA, lipids and HTN and discussion of ongoing theray for depression.  The patient denies chest pain, palpitations, dizziness, syncope, low blood sugar symptoms, high blood sugar symptoms, edema, SOB, DOE, PND, and orthopnea.  Since the last visit the patient notes problems with medications.  The patient reports taking meds as prescribed, monitoring BP, and dietary compliance.  When questioned about possible medication side effects, the patient notes none.    Preventive Screening-Counseling & Management  Alcohol-Tobacco     Smoking Status: quit  Problems Prior to Update: 1)  Hypoglycemia, Unspecified  (ICD-251.2) 2)  Palpitations, Chronic  (ICD-785.1) 3)  Allergic Rhinitis  (ICD-477.9) 4)  Sinusitis- Acute-nos  (ICD-461.9) 5)  Upper Respiratory Infection, Acute, With Bronchitis  (ICD-465.9) 6)  Uns Advrs Eff Uns Rx Medicinal&biological Sbstnc  (ICD-995.20) 7)  Nose Fracture  (ICD-802.0) 8)  Hypogonadism, Male  (ICD-257.2) 9)  Depression, Mild  (ICD-311) 10)  Low Back Pain  (ICD-724.2) 11)  Osteoarthritis  (ICD-715.90) 12)  Benign Prostatic Hypertrophy, With Obstruction  (ICD-600.01) 13)  Hyperglycemia, Borderline  (ICD-790.29) 14)  Examination, Routine Medical  (ICD-V70.0) 15)  Internal Hemorrhoids  (ICD-455.0) 16)  Hypothyroidism  (ICD-244.9) 17)  Overactive Bladder  (ICD-596.51) 18)  Hyperlipidemia   (ICD-272.4)  Medications Prior to Update: 1)  Folic Acid 1 Mg Tabs (Folic Acid) .... One By Mouth Daily 2)  Proscar 5 Mg Tabs (Finasteride) .... Take 1 Tablet By Mouth Once A Day 3)  Synthroid 25 Mcg Tabs (Levothyroxine Sodium) .Marland Kitchen.. 1 Tablet By Mouth Once A Day 4)  Sertraline Hcl 50 Mg Tabs (Sertraline Hcl) .... 1/2 By Mouth Daily 5)  Calcium 500/d 500-200 Mg-Unit  Tabs (Calcium Carbonate-Vitamin D) .... One By Mouth Daily 6)  B-50 Complex  Tabs (B Complex-Folic Acid) .Marland Kitchen.. 1 Once Daily 7)  Analpram-Hc 1-2.5 % Crea (Hydrocortisone Ace-Pramoxine) .... Use As Directed For Hemorrhoidal Flare Up 8)  Simvastatin 20 Mg Tabs (Simvastatin) .... Take 1 Tablet By Mouth Each Evening 9)  Prascion 10-5 % Emul (Sulfacetamide Sodium-Sulfur) .... Use As Directed 10)  Desonide 0.05 % Crea (Desonide) .... Use As Directed 11)  Ascriptin 325 Mg Tabs (Aspirin Buf(Alhyd-Mghyd-Cacar)) 12)  Fluticasone Propionate 50 Mcg/act Susp (Fluticasone Propionate) .... 2 Sprays in Each Nostril Daily For Allergy Symptoms 13)  Androgel Pump 1 % Gel (Testosterone) .... 4 Pumps Once Daily  Current Medications (verified): 1)  Folic Acid 1 Mg Tabs (Folic Acid) .... One By Mouth Daily 2)  Proscar 5 Mg Tabs (Finasteride) .... Take 1 Tablet By Mouth Once A Day 3)  Synthroid 25 Mcg Tabs (Levothyroxine Sodium) .Marland Kitchen.. 1 Tablet By Mouth Once A Day 4)  Sertraline Hcl 50 Mg Tabs (Sertraline Hcl) .... 1/2 By Mouth Daily 5)  B-50 Complex  Tabs (  B Complex-Folic Acid) .Marland Kitchen.. 1 Once Daily 6)  Analpram-Hc 1-2.5 % Crea (Hydrocortisone Ace-Pramoxine) .... Use As Directed For Hemorrhoidal Flare Up 7)  Simvastatin 20 Mg Tabs (Simvastatin) .... Take 1 Tablet By Mouth Each Evening 8)  Prascion 10-5 % Emul (Sulfacetamide Sodium-Sulfur) .... Use As Directed 9)  Desonide 0.05 % Crea (Desonide) .... Use As Directed 10)  Aspirin 81 Mg Tabs (Aspirin) .Marland Kitchen.. 1 Once Daily 11)  Fluticasone Propionate 50 Mcg/act Susp (Fluticasone Propionate) .... 2 Sprays in Each  Nostril Daily For Allergy Symptoms 12)  Androgel Pump 1 % Gel (Testosterone) .... 4 Pumps Once Daily  Allergies (verified): 1)  Pcn  Past History:  Family History: Last updated: 10-26-06 father died from CVA at 73 Mother Family History Lung cancer  Social History: Last updated: 10/26/06 Retired Married Former Smoker quit for 40 years Alcohol use-yes Drug use-no Regular exercise-yes  Risk Factors: Alcohol Use: 1 (10/26/06) Caffeine Use: 0 (2006/10/26) Exercise: yes (2006/10/26)  Risk Factors: Smoking Status: quit (03/22/2009) Passive Smoke Exposure: no (10-26-06)  Past medical, surgical, family and social histories (including risk factors) reviewed, and no changes noted (except as noted below).  Past Medical History: Reviewed history from 03/26/2008 and no changes required. Hypothyroidism hyperlipidemia hemorrhiods overactive bladder Diabetes mellitus, type II Osteoarthritis Low back pain Allergic rhinitis  Past Surgical History: Reviewed history from 02/10/2007 and no changes required. index finger right hand Tonsillectomy FX GREAT TOE  Family History: Reviewed history from 10/26/2006 and no changes required. father died from CVA at 73 Mother Family History Lung cancer  Social History: Reviewed history from 10-26-06 and no changes required. Retired Married Former Smoker quit for 40 years Alcohol use-yes Drug use-no Regular exercise-yes  Review of Systems  The patient denies anorexia, fever, weight loss, weight gain, vision loss, decreased hearing, hoarseness, chest pain, syncope, dyspnea on exertion, peripheral edema, prolonged cough, headaches, hemoptysis, abdominal pain, melena, hematochezia, severe indigestion/heartburn, hematuria, incontinence, genital sores, muscle weakness, suspicious skin lesions, transient blindness, difficulty walking, depression, unusual weight change, abnormal bleeding, enlarged lymph nodes, angioedema, and  breast masses.    Physical Exam  General:  alert.  NADwell-developed and well-hydrated.   Head:  atraumatic and male-pattern balding.   Eyes:  No corneal or conjunctival inflammation noted. EOMI. Perrla. Funduscopic exam benign, without hemorrhages, exudates or papilledema. Vision grossly normal. Ears:  R ear normal and L ear normal.   Nose:  no external deformity and no nasal discharge.   Mouth:  no erythema and no exudates.   Neck:  supple and no cervical lymphadenopathy.   Lungs:  normal respiratory effort, normal breath sounds, no crackles, and no wheezes.   Heart:  Normal rate and regular rhythm. S1 and S2 normal without gallop, murmur, click, rub or other extra sounds. Abdomen:  Bowel sounds positive,abdomen soft and non-tender without masses, organomegaly or hernias noted. Msk:  No deformity or scoliosis noted of thoracic or lumbar spine.   Pulses:  R and L carotid,radial,femoral,dorsalis pedis and posterior tibial pulses are full and equal bilaterally Extremities:  No clubbing, cyanosis, edema, or deformity noted with normal full range of motion of all joints.   Neurologic:  No cranial nerve deficits noted. Station and gait are normal. Plantar reflexes are down-going bilaterally. DTRs are symmetrical throughout. Sensory, motor and coordinative functions appear intact.   Impression & Recommendations:  Problem # 1:  DEPRESSION, MILD (ICD-311) stop the medication His updated medication list for this problem includes:    Sertraline Hcl 50 Mg Tabs (Sertraline hcl) .Marland KitchenMarland KitchenMarland KitchenMarland Kitchen  1/2 by mouth daily  Discussed treatment options, including trial of antidpressant medication. Will refer to behavioral health. Follow-up call in in 24-48 hours and recheck in 2 weeks, sooner as needed. Patient agrees to call if any worsening of symptoms or thoughts of doing harm arise. Verified that the patient has no suicidal ideation at this time.   Problem # 2:  HYPERGLYCEMIA, BORDERLINE (ICD-790.29)  Labs  Reviewed: Creat: 0.8 (09/08/2008)     Problem # 3:  HYPERLIPIDEMIA (ICD-272.4) reversal of plaque His updated medication list for this problem includes:    Simvastatin 20 Mg Tabs (Simvastatin) .Marland Kitchen... Take 1 tablet by mouth each evening  Labs Reviewed: SGOT: 34 (03/02/2009)   SGPT: 36 (03/02/2009)  Lipid Goals: Chol Goal: 200 (04/23/2007)   HDL Goal: 40 (04/23/2007)   LDL Goal: 130 (04/23/2007)   TG Goal: 150 (04/23/2007)  Prior 10 Yr Risk Heart Disease: 9 % (04/23/2007)   HDL:26.00 (03/02/2009), 33.90 (09/08/2008)  LDL:70 (03/02/2009), 79 (09/08/2008)  Chol:116 (03/02/2009), 135 (09/08/2008)  Trig:102.0 (03/02/2009), 109.0 (09/08/2008)  Problem # 4:  BENIGN PROSTATIC HYPERTROPHY, WITH OBSTRUCTION (ICD-600.01)  His updated medication list for this problem includes:    Proscar 5 Mg Tabs (Finasteride) .Marland Kitchen... Take 1 tablet by mouth once a day  PSA: 0.66 (09/08/2008)     Orders: Venipuncture (04540) T-PSA Total ([address])  Complete Medication List: 1)  Folic Acid 1 Mg Tabs (Folic acid) .... One by mouth daily 2)  Proscar 5 Mg Tabs (Finasteride) .... Take 1 tablet by mouth once a day 3)  Synthroid 25 Mcg Tabs (Levothyroxine sodium) .Marland Kitchen.. 1 tablet by mouth once a day 4)  Sertraline Hcl 50 Mg Tabs (Sertraline hcl) .... 1/2 by mouth daily 5)  B-50 Complex Tabs (B complex-folic acid) .Marland Kitchen.. 1 once daily 6)  Analpram-hc 1-2.5 % Crea (Hydrocortisone ace-pramoxine) .... Use as directed for hemorrhoidal flare up 7)  Simvastatin 20 Mg Tabs (Simvastatin) .... Take 1 tablet by mouth each evening 8)  Prascion 10-5 % Emul (Sulfacetamide sodium-sulfur) .... Use as directed 9)  Desonide 0.05 % Crea (Desonide) .... Use as directed 10)  Aspirin 81 Mg Tabs (Aspirin) .Marland Kitchen.. 1 once daily 11)  Fluticasone Propionate 50 Mcg/act Susp (Fluticasone propionate) .... 2 sprays in each nostril daily for allergy symptoms 12)  Androgel Pump 1 % Gel (Testosterone) .... 4 pumps once daily  Patient Instructions: 1)   take the sertaline 1/2 tab  every other day for 10 days then stop 2)  Please schedule a follow-up appointment in 6 months.  CPX Prescriptions: ANDROGEL PUMP 1 % GEL (TESTOSTERONE) 4 pumps once daily  #30 day x 5   Entered by:   Willy Eddy, LPN   Authorized by:   Stacie Glaze MD   Signed by:   Willy Eddy, LPN on 29/56/2130   Method used:   Print then Give to Patient   RxID:   [id number] SIMVASTATIN 20 MG TABS (SIMVASTATIN) Take 1 tablet by mouth each evening  #90 x 3   Entered by:   Willy Eddy, LPN   Authorized by:   Stacie Glaze MD   Signed by:   Willy Eddy, LPN on 32/44/0102   Method used:   Print then Give to Patient   RxID:   [id number] ANALPRAM-HC 1-2.5 % CREA (HYDROCORTISONE ACE-PRAMOXINE) Use as directed for hemorrhoidal flare up  #30gm x 2   Entered by:   Willy Eddy, LPN   Authorized by:   Stacie Glaze  MD   Signed by:   Willy Eddy, LPN on 78/29/5621   Method used:   Print then Give to Patient   RxID:   [id number] SERTRALINE HCL 50 MG TABS (SERTRALINE HCL) 1/2 by mouth daily  #45 x 3   Entered by:   Willy Eddy, LPN   Authorized by:   Stacie Glaze MD   Signed by:   Willy Eddy, LPN on 41/32/4401   Method used:   Print then Give to Patient   RxID:   [id number] PROSCAR 5 MG TABS (FINASTERIDE) Take 1 tablet by mouth once a day  #90 x 3   Entered by:   Willy Eddy, LPN   Authorized by:   Stacie Glaze MD   Signed by:   Willy Eddy, LPN on 59/56/3875   Method used:   Print then Give to Patient   RxID:   [id number] FOLIC ACID 1 MG TABS (FOLIC ACID) one by mouth daily  #90 x 3   Entered by:   Willy Eddy, LPN   Authorized by:   Stacie Glaze MD   Signed by:   Willy Eddy, LPN on 30/16/0109   Method used:   Print then Give to Patient   RxID:   [id number]

## 2010-04-24 NOTE — Progress Notes (Signed)
Summary: REFILL REQUEST  Phone Note Refill Request Message from:  Patient on September 22, 2009 4:53 PM  Refills Requested: Medication #1:  ANDROGEL PUMP 1 % GEL 4 pumps once daily.   Notes: [facility] ([place]) [address] [place]... Pt coming in for cpx on  10/13/2009... Needs med before then.    Initial call taken by: Debbra Riding,  September 22, 2009 4:55 PM    Prescriptions: ANDROGEL PUMP 1 % GEL (TESTOSTERONE) 4 pumps once daily  #30 day x 5   Entered by:   Willy Eddy, LPN   Authorized by:   Stacie Glaze MD   Signed by:   Willy Eddy, LPN on 16/12/9602   Method used:   Telephoned to ...       [facility]. [phone]* (retail)       [phone] [address]       [place], [place]  [address]       Ph: [phone]       Fax: [phone]   RxID:   [id number]

## 2010-04-24 NOTE — Progress Notes (Signed)
Summary: asking for appt  Phone Note Call from Patient Call back at Home Phone [phone]   Caller: Patient Call For: Stacie Glaze MD Reason for Call: Acute Illness Summary of Call: Pt had diarrhea yesterday, did not sleep well, and is feeling dizzy today.  Is asking to see Dr. Lovell Sheehan today.  No fever, or pain anywhere. Initial call taken by: Lynann Beaver CMA,  December 15, 2009 9:22 AM  Follow-up for Phone Call        pt informed maybe stomach virus and instructed to take clear liquids for 24-48 hours and use imodium for diarrhea- if not better call back and number given for saturday clinic--do not use pepto bismol Follow-up by: Willy Eddy, LPN,  December 15, 2009 10:19 AM

## 2010-04-24 NOTE — Assessment & Plan Note (Signed)
Summary: REVIEW LABWORK // RS   Vital Signs:  Patient profile:   75 year old male Height:      69 inches Weight:      191 pounds BMI:     28.31 Temp:     98.2 degrees F oral Pulse rate:   68 / minute Resp:     14 per minute BP sitting:   136 / 80  (left arm)  Vitals Entered By: Willy Eddy, LPN (October 13, 2009 4:14 PM)  Nutrition Counseling: Patient's BMI is greater than 25 and therefore counseled on weight management options. CC: annual visit for disease management, Depression Is Patient Diabetic? No  Vision Screening:Left eye with correction: 20 / 20 Right eye with correction: 20 / 20 Both eyes with correction: 20 / 20       40db HL: Left  Right  Audiometry Comment: whispered voice at 6 ft stable    CC:  annual visit for disease management and Depression.  History of Present Illness: The pt was asked about all immunizations, health maint. services that are appropriate to their age and was given guidance on diet exercize  and weight management Here for Medicare AWV:  1.   Risk factors based on Past M, S, F history:  reviewed 2.   Physical Activities: daily exercize 3.   Depression/mood: stable 4.   Hearing: stable 5.   ADL's:  no limitations 6.   Fall Risk: now 7.   Home Safety:  none noted 8.   Height, weight, &visual acuity:   reviewe in chart 9.   Counseling:   see note 10.   Labs ordered based on risk factors:  see ordered 11.           Referral Coordination   Urology referral is [facility] ( grapy) and Dr Karlyn Agee for derm 12.           Care Plan see note  Jethro Bolus for eyes 13.            Cognitive Assessment  no deficits  Of note is the fact that he has fgained weight ans has elevated glucose in screening labs suggesting prediabetes   Anticoagulation Management History:      Positive risk factors for bleeding include an age of 5 years or older.  The bleeding index is 'intermediate risk'.  Negative CHADS2 values include Age > 68 years old.      Depression History:      The patient denies significant weight loss, significant weight gain, insomnia, hypersomnia, psychomotor agitation, psychomotor retardation, fatigue (loss of energy), feelings of worthlessness (guilt), impaired concentration (indecisiveness), and recurrent thoughts of death or suicide.              Preventive Screening-Counseling & Management  Alcohol-Tobacco     Alcohol drinks/day: 1     Smoking Status: quit     Year Quit: 1970     Pack years: 2     Passive Smoke Exposure: no  Caffeine-Diet-Exercise     Caffeine use/day: 1-2 cupps a day     [facility] Depression Score: not depressed  Problems Prior to Update: 1)  Hypoglycemia, Unspecified  (ICD-251.2) 2)  Palpitations, Chronic  (ICD-785.1) 3)  Allergic Rhinitis  (ICD-477.9) 4)  Sinusitis- Acute-nos  (ICD-461.9) 5)  Upper Respiratory Infection, Acute, With Bronchitis  (ICD-465.9) 6)  Uns Advrs Eff Uns Rx Medicinal&biological Sbstnc  (ICD-995.20) 7)  Nose Fracture  (ICD-802.0) 8)  Hypogonadism, Male  (ICD-257.2) 9)  Depression, Mild  (ICD-311) 10)  Low Back Pain  (ICD-724.2) 11)  Osteoarthritis  (ICD-715.90) 12)  Benign Prostatic Hypertrophy, With Obstruction  (ICD-600.01) 13)  Hyperglycemia, Borderline  (ICD-790.29) 14)  Examination, Routine Medical  (ICD-V70.0) 15)  Internal Hemorrhoids  (ICD-455.0) 16)  Hypothyroidism  (ICD-244.9) 17)  Overactive Bladder  (ICD-596.51) 18)  Hyperlipidemia  (ICD-272.4)  Current Problems (verified): 1)  Hypoglycemia, Unspecified  (ICD-251.2) 2)  Palpitations, Chronic  (ICD-785.1) 3)  Allergic Rhinitis  (ICD-477.9) 4)  Sinusitis- Acute-nos  (ICD-461.9) 5)  Upper Respiratory Infection, Acute, With Bronchitis  (ICD-465.9) 6)  Uns Advrs Eff Uns Rx Medicinal&biological Sbstnc  (ICD-995.20) 7)  Nose Fracture  (ICD-802.0) 8)  Hypogonadism, Male  (ICD-257.2) 9)  Depression, Mild  (ICD-311) 10)  Low Back Pain  (ICD-724.2) 11)  Osteoarthritis  (ICD-715.90) 12)  Benign  Prostatic Hypertrophy, With Obstruction  (ICD-600.01) 13)  Hyperglycemia, Borderline  (ICD-790.29) 14)  Examination, Routine Medical  (ICD-V70.0) 15)  Internal Hemorrhoids  (ICD-455.0) 16)  Hypothyroidism  (ICD-244.9) 17)  Overactive Bladder  (ICD-596.51) 18)  Hyperlipidemia  (ICD-272.4)  Medications Prior to Update: 1)  Folic Acid 1 Mg Tabs (Folic Acid) .... One By Mouth Daily 2)  Proscar 5 Mg Tabs (Finasteride) .... Take 1 Tablet By Mouth Once A Day 3)  Synthroid 25 Mcg Tabs (Levothyroxine Sodium) .Marland Kitchen.. 1 Tablet By Mouth Once A Day 4)  Sertraline Hcl 50 Mg Tabs (Sertraline Hcl) .... 1/2 By Mouth Daily 5)  B-50 Complex  Tabs (B Complex-Folic Acid) .Marland Kitchen.. 1 Once Daily 6)  Analpram-Hc 1-2.5 % Crea (Hydrocortisone Ace-Pramoxine) .... Use As Directed For Hemorrhoidal Flare Up 7)  Simvastatin 20 Mg Tabs (Simvastatin) .... Take 1 Tablet By Mouth Each Evening 8)  Prascion 10-5 % Emul (Sulfacetamide Sodium-Sulfur) .... Use As Directed 9)  Desonide 0.05 % Crea (Desonide) .... Use As Directed 10)  Aspirin 81 Mg Tabs (Aspirin) .Marland Kitchen.. 1 Once Daily 11)  Fluticasone Propionate 50 Mcg/act Susp (Fluticasone Propionate) .... 2 Sprays in Each Nostril Daily For Allergy Symptoms 12)  Androgel Pump 1 % Gel (Testosterone) .... 4 Pumps Once Daily  Current Medications (verified): 1)  Folic Acid 1 Mg Tabs (Folic Acid) .... One By Mouth Daily 2)  Proscar 5 Mg Tabs (Finasteride) .... Take 1 Tablet By Mouth Once A Day 3)  Synthroid 25 Mcg Tabs (Levothyroxine Sodium) .Marland Kitchen.. 1 Tablet By Mouth Once A Day 4)  B-50 Complex  Tabs (B Complex-Folic Acid) .Marland Kitchen.. 1 Once Daily 5)  Analpram-Hc 1-2.5 % Crea (Hydrocortisone Ace-Pramoxine) .... Use As Directed For Hemorrhoidal Flare Up 6)  Simvastatin 20 Mg Tabs (Simvastatin) .... Take 1 Tablet By Mouth Each Evening 7)  Prascion 10-5 % Emul (Sulfacetamide Sodium-Sulfur) .... Use As Directed 8)  Desonide 0.05 % Crea (Desonide) .... Use As Directed 9)  Aspirin 81 Mg Tabs (Aspirin) .Marland Kitchen.. 1  Once Daily 10)  Fluticasone Propionate 50 Mcg/act Susp (Fluticasone Propionate) .... 2 Sprays in Each Nostril Daily For Allergy Symptoms 11)  Androgel Pump 1 % Gel (Testosterone) .... 4 Pumps Once Daily  Allergies (verified): 1)  Pcn  Past History:  Family History: Last updated: 11-04-06 father died from CVA at 57 Mother Family History Lung cancer  Social History: Last updated: 11/04/06 Retired Married Former Smoker quit for 40 years Alcohol use-yes Drug use-no Regular exercise-yes  Risk Factors: Alcohol Use: 1 (November 03, 2009) Caffeine Use: 1-2 cupps a day (11-03-09) Exercise: yes (2006-11-04)  Risk Factors: Smoking Status: quit (11-03-2009) Passive Smoke Exposure: no (11-03-2009)  Past medical, surgical, family  and social histories (including risk factors) reviewed, and no changes noted (except as noted below).  Past Medical History: Reviewed history from 03/26/2008 and no changes required. Hypothyroidism hyperlipidemia hemorrhiods overactive bladder Diabetes mellitus, type II Osteoarthritis Low back pain Allergic rhinitis  Past Surgical History: Reviewed history from 02/10/2007 and no changes required. index finger right hand Tonsillectomy FX GREAT TOE  Family History: Reviewed history from 10/14/2006 and no changes required. father died from CVA at 75 Mother Family History Lung cancer  Social History: Reviewed history from 10/14/2006 and no changes required. Retired Married Former Smoker quit for 40 years Alcohol use-yes Drug use-no Regular exercise-yes Caffeine use/day:  1-2 cupps a day  Review of Systems  The patient denies anorexia, fever, weight loss, weight gain, vision loss, decreased hearing, hoarseness, chest pain, syncope, dyspnea on exertion, peripheral edema, prolonged cough, headaches, hemoptysis, abdominal pain, melena, hematochezia, severe indigestion/heartburn, hematuria, incontinence, genital sores, muscle weakness,  suspicious skin lesions, transient blindness, difficulty walking, depression, unusual weight change, abnormal bleeding, enlarged lymph nodes, angioedema, and breast masses.    Physical Exam  General:  alert.  NADwell-developed and well-hydrated.   Eyes:  No corneal or conjunctival inflammation noted. EOMI. Perrla. Funduscopic exam benign, without hemorrhages, exudates or papilledema. Vision grossly normal. Nose:  no external deformity and no nasal discharge.   Mouth:  no erythema and no exudates.   Neck:  supple and no cervical lymphadenopathy.   Lungs:  normal respiratory effort, normal breath sounds, no crackles, and no wheezes.   Heart:  Normal rate and regular rhythm. S1 and S2 normal without gallop, murmur, click, rub or other extra sounds. Abdomen:  Bowel sounds positive,abdomen soft and non-tender without masses, organomegaly or hernias noted. Msk:  No deformity or scoliosis noted of thoracic or lumbar spine.   Pulses:  R and L carotid,radial,femoral,dorsalis pedis and posterior tibial pulses are full and equal bilaterally Extremities:  No clubbing, cyanosis, edema, or deformity noted with normal full range of motion of all joints.   Neurologic:  No cranial nerve deficits noted. Station and gait are normal. Plantar reflexes are down-going bilaterally. DTRs are symmetrical throughout. Sensory, motor and coordinative functions appear intact.    Impression & Recommendations:  Problem # 1:  HYPOGONADISM, MALE (ICD-257.2) reduce the androgel to 3 pumps  Problem # 2:  METABOLIC SYNDROME X (ICD-277.7) limit the concentrated sweets  stop the ice cream at night  Problem # 3:  OSTEOARTHRITIS (ICD-715.90) moniter joint pain and arthritis His updated medication list for this problem includes:    Aspirin 81 Mg Tabs (Aspirin) .Marland Kitchen... 1 once daily trial Pennsaid for joint oain Discussed use of medications, application of heat or cold, and exercises.   Problem # 4:  LOW BACK PAIN  (ICD-724.2)  His updated medication list for this problem includes:    Aspirin 81 Mg Tabs (Aspirin) .Marland Kitchen... 1 once daily  Discussed use of moist heat or ice, modified activities, medications, and stretching/strengthening exercises. Back care instructions given. To be seen in 2 weeks if no improvement; sooner if worsening of symptoms.   Complete Medication List: 1)  Folic Acid 1 Mg Tabs (Folic acid) .... One by mouth daily 2)  Proscar 5 Mg Tabs (Finasteride) .... Take 1 tablet by mouth once a day 3)  Synthroid 50 Mcg Tabs (Levothyroxine sodium) .... One by mouth daily 4)  B-50 Complex Tabs (B complex-folic acid) .Marland Kitchen.. 1 once daily 5)  Analpram-hc 1-2.5 % Crea (Hydrocortisone ace-pramoxine) .... Use as directed for hemorrhoidal flare up  6)  Simvastatin 20 Mg Tabs (Simvastatin) .... Take 1 tablet by mouth each evening 7)  Prascion 10-5 % Emul (Sulfacetamide sodium-sulfur) .... Use as directed 8)  Desonide 0.05 % Crea (Desonide) .... Use as directed 9)  Aspirin 81 Mg Tabs (Aspirin) .Marland Kitchen.. 1 once daily 10)  Fluticasone Propionate 50 Mcg/act Susp (Fluticasone propionate) .... 2 sprays in each nostril daily for allergy symptoms 11)  Androgel Pump 1 % Gel (Testosterone) .... 3 pumps once daily  Other Orders: First annual wellness visit with prevention plan  [phone])  Patient Instructions: 1)  change the androgel to three pumps 2)  increase the synthroid 50 micrograms a day ( may take two of the 25's) 3)  cut the night time ice cream out 4)  Please schedule a follow-up appointment in 3 months. 5)  TSH prior to visit, ICD-9:244.9 6)  testosterone  607.84 Prescriptions: SYNTHROID 50 MCG TABS (LEVOTHYROXINE SODIUM) one by mouth daily  #90 x 3   Entered and Authorized by:   Stacie Glaze MD   Signed by:   Stacie Glaze MD on 10/13/2009   Method used:   Electronically to        [facility]* (mail-order)       [address] [place], [place]  [address]       Ph: [phone]        Fax: [phone]   RxID:   [id number] SIMVASTATIN 20 MG TABS (SIMVASTATIN) Take 1 tablet by mouth each evening  #90 x 3   Entered by:   Willy Eddy, LPN   Authorized by:   Stacie Glaze MD   Signed by:   Willy Eddy, LPN on 61/60/7371   Method used:   Electronically to        [facility]* (mail-order)       [address] [place], [place]  [address]       Ph: [phone]       Fax: [phone]   RxID:   [id number] SYNTHROID 25 MCG TABS (LEVOTHYROXINE SODIUM) 1 tablet by mouth once a day  #90 x 3   Entered by:   Willy Eddy, LPN   Authorized by:   Stacie Glaze MD   Signed by:   Willy Eddy, LPN on 01/16/8526   Method used:   Electronically to        [facility]* (mail-order)       [address] [place], [place]  [address]       Ph: [phone]       Fax: [phone]   RxID:   [id number] PROSCAR 5 MG TABS (FINASTERIDE) Take 1 tablet by mouth once a day  #90 x 3   Entered by:   Willy Eddy, LPN   Authorized by:   Stacie Glaze MD   Signed by:   Willy Eddy, LPN on 38/25/0539   Method used:   Electronically to        [facility]* (mail-order)       [address] [place], [place]  [address]       Ph: [phone]       Fax: [phone]   RxID:   [id number] FOLIC ACID 1 MG TABS (FOLIC ACID) one by mouth daily  #90 x 3  Entered by:   Willy Eddy, LPN   Authorized by:   Stacie Glaze MD   Signed by:   Willy Eddy, LPN on 36/64/4034   Method used:   Electronically to        [facility]* (mail-order)       [address] [place], [place]  [address]       Ph: [phone]       Fax: [phone]   RxID:   [id number]   Prevention & Chronic Care Immunizations   Influenza vaccine: Fluvax 3+  (01/11/2009)   Influenza vaccine due: 11/24/2010    Tetanus booster: 03/25/2005: Td    Pneumococcal vaccine: Pneumovax  (Medicare)  (03/25/2002)   Pneumococcal vaccine deferral: Deferred  (10/13/2009)    H. zoster vaccine: Not documented    Immunization comments: will give vaccine at next OV  Colorectal Screening   Hemoccult: Not documented    Colonoscopy: abnormal  (03/06/2006)   Colonoscopy due: 02/2016  Other Screening   PSA: 0.77  (09/26/2009)   PSA due due: 09/27/2010   Smoking status: quit  (10/13/2009)  Lipids   Total Cholesterol: 99  (09/26/2009)   LDL: 51  (09/26/2009)   LDL Direct: Not documented   HDL: 25.40  (09/26/2009)   Triglycerides: 113.0  (09/26/2009)    SGOT (AST): 38  (09/26/2009)   SGPT (ALT): 47  (09/26/2009)   Alkaline phosphatase: 31  (09/26/2009)   Total bilirubin: 0.7  (09/26/2009)    Lipid flowsheet reviewed?: Yes   Progress toward LDL goal: At goal  Self-Management Support :    Patient will work on the following items until the next clinic visit to reach self-care goals:     Medications and monitoring: take my medicines every day, check my blood pressure  (10/13/2009)     Eating: eat more vegetables  (10/13/2009)     Activity: take a 30 minute walk every day  (10/13/2009)    Lipid self-management support: Lipid monitoring log  (10/13/2009)

## 2010-04-24 NOTE — Letter (Signed)
Summary: [facility]  [facility]   Imported By: Kassie Mends 12/23/2006 08:49:53  _____________________________________________________________________  External Attachment:    Type:   Image     Comment:   [facility]

## 2010-04-24 NOTE — Assessment & Plan Note (Signed)
Summary: to discuss dizziness/dm/Resch/rcd   Vital Signs:  Patient profile:   75 year old male Height:      69 inches Weight:      196 pounds BMI:     29.05 Temp:     98.5 degrees F oral Pulse rate:   72 / minute Resp:     14 per minute BP sitting:   130 / 80  (left arm)  Vitals Entered By: Willy Eddy, LPN (October 10, 2008 3:52 PM)  CC:  was working out on a saturday , had low bp and became dizyy, and COULDNT REMEMBER IF HE HAD EATEN BREAFAST-to urgent care with ekg done .  History of Present Illness: Palpitations was on an eliptical for 39 min and had not eaten had a blood pressure drop and  had presyncopy when over to prompt med and had an ekg had sinus bradycardia Told that he had diagnosed his heart dz and gave no meds the EKG showed only nonspecific stT wave change the pt did not note nor report slow HR with the blood pressure drop seen at the gym   Problems Prior to Update: 1)  Allergic Rhinitis  (ICD-477.9) 2)  Sinusitis- Acute-nos  (ICD-461.9) 3)  Upper Respiratory Infection, Acute, With Bronchitis  (ICD-465.9) 4)  Uns Advrs Eff Uns Rx Medicinal&biological Sbstnc  (ICD-995.20) 5)  Nose Fracture  (ICD-802.0) 6)  Hypogonadism, Male  (ICD-257.2) 7)  Depression, Mild  (ICD-311) 8)  Low Back Pain  (ICD-724.2) 9)  Osteoarthritis  (ICD-715.90) 10)  Benign Prostatic Hypertrophy, With Obstruction  (ICD-600.01) 11)  Hyperglycemia, Borderline  (ICD-790.29) 12)  Examination, Routine Medical  (ICD-V70.0) 13)  Internal Hemorrhoids  (ICD-455.0) 14)  Hypothyroidism  (ICD-244.9) 15)  Overactive Bladder  (ICD-596.51) 16)  Hyperlipidemia  (ICD-272.4)  Medications Prior to Update: 1)  Folic Acid 1 Mg Tabs (Folic Acid) .... One By Mouth Daily 2)  Proscar 5 Mg Tabs (Finasteride) .... Take 1 Tablet By Mouth Once A Day 3)  Synthroid 25 Mcg Tabs (Levothyroxine Sodium) .Marland Kitchen.. 1 Tablet By Mouth Once A Day 4)  Sertraline Hcl 50 Mg Tabs (Sertraline Hcl) .... 1/2 By Mouth Daily 5)   Calcium 500/d 500-200 Mg-Unit  Tabs (Calcium Carbonate-Vitamin D) .... One By Mouth Daily 6)  B-50 Complex  Tabs (B Complex-Folic Acid) .Marland Kitchen.. 1 Once Daily 7)  Analpram-Hc 1-2.5 % Crea (Hydrocortisone Ace-Pramoxine) .... Use As Directed For Hemorrhoidal Flare Up 8)  Simvastatin 20 Mg Tabs (Simvastatin) .... Take 1 Tablet By Mouth Each Evening 9)  Prascion 10-5 % Emul (Sulfacetamide Sodium-Sulfur) .... Use As Directed 10)  Desonide 0.05 % Crea (Desonide) .... Use As Directed 11)  Ascriptin 325 Mg Tabs (Aspirin Buf(Alhyd-Mghyd-Cacar)) 12)  Fluticasone Propionate 50 Mcg/act Susp (Fluticasone Propionate) .... 2 Sprays in Each Nostril Daily For Allergy Symptoms 13)  Androgel Pump 1 % Gel (Testosterone) .... 4 Pumps Once Daily  Current Medications (verified): 1)  Folic Acid 1 Mg Tabs (Folic Acid) .... One By Mouth Daily 2)  Proscar 5 Mg Tabs (Finasteride) .... Take 1 Tablet By Mouth Once A Day 3)  Synthroid 25 Mcg Tabs (Levothyroxine Sodium) .Marland Kitchen.. 1 Tablet By Mouth Once A Day 4)  Sertraline Hcl 50 Mg Tabs (Sertraline Hcl) .... 1/2 By Mouth Daily 5)  Calcium 500/d 500-200 Mg-Unit  Tabs (Calcium Carbonate-Vitamin D) .... One By Mouth Daily 6)  B-50 Complex  Tabs (B Complex-Folic Acid) .Marland Kitchen.. 1 Once Daily 7)  Analpram-Hc 1-2.5 % Crea (Hydrocortisone Ace-Pramoxine) .... Use As Directed For  Hemorrhoidal Flare Up 8)  Simvastatin 20 Mg Tabs (Simvastatin) .... Take 1 Tablet By Mouth Each Evening 9)  Prascion 10-5 % Emul (Sulfacetamide Sodium-Sulfur) .... Use As Directed 10)  Desonide 0.05 % Crea (Desonide) .... Use As Directed 11)  Ascriptin 325 Mg Tabs (Aspirin Buf(Alhyd-Mghyd-Cacar)) 12)  Fluticasone Propionate 50 Mcg/act Susp (Fluticasone Propionate) .... 2 Sprays in Each Nostril Daily For Allergy Symptoms 13)  Androgel Pump 1 % Gel (Testosterone) .... 4 Pumps Once Daily  Allergies (verified): 1)  Pcn  Past History:  Family History: Last updated: 2006/11/06 father died from CVA at 18 Mother Family  History Lung cancer  Social History: Last updated: November 06, 2006 Retired Married Former Smoker quit for 40 years Alcohol use-yes Drug use-no Regular exercise-yes  Risk Factors: Alcohol Use: 1 (06-Nov-2006) Caffeine Use: 0 (11/06/2006) Exercise: yes (Nov 06, 2006)  Risk Factors: Smoking Status: quit (06-Nov-2006) Passive Smoke Exposure: no (11/06/2006)  Past medical, surgical, family and social histories (including risk factors) reviewed, and no changes noted (except as noted below).  Past Medical History: Reviewed history from 03/26/2008 and no changes required. Hypothyroidism hyperlipidemia hemorrhiods overactive bladder Diabetes mellitus, type II Osteoarthritis Low back pain Allergic rhinitis  Past Surgical History: Reviewed history from 02/10/2007 and no changes required. index finger right hand Tonsillectomy FX GREAT TOE  Family History: Reviewed history from 2006/11/06 and no changes required. father died from CVA at 40 Mother Family History Lung cancer  Social History: Reviewed history from Nov 06, 2006 and no changes required. Retired Married Former Smoker quit for 40 years Alcohol use-yes Drug use-no Regular exercise-yes  Review of Systems  The patient denies anorexia, fever, weight loss, weight gain, vision loss, decreased hearing, hoarseness, chest pain, syncope, dyspnea on exertion, peripheral edema, prolonged cough, headaches, hemoptysis, abdominal pain, melena, hematochezia, severe indigestion/heartburn, hematuria, incontinence, genital sores, muscle weakness, suspicious skin lesions, transient blindness, difficulty walking, depression, unusual weight change, abnormal bleeding, enlarged lymph nodes, angioedema, breast masses, and testicular masses.    Physical Exam  General:  alert.  NADwell-developed and well-hydrated.   Head:  atraumatic and male-pattern balding.   Eyes:  No corneal or conjunctival inflammation noted. EOMI. Perrla. Funduscopic exam  benign, without hemorrhages, exudates or papilledema. Vision grossly normal. Ears:  R ear normal and L ear normal.   Mouth:  no erythema and no exudates.   Neck:  supple and no cervical lymphadenopathy.   Chest Wall:  No deformities, masses, tenderness or gynecomastia noted. Lungs:  normal respiratory effort, normal breath sounds, no crackles, and no wheezes.   Heart:  Normal rate and regular rhythm. S1 and S2 normal without gallop, murmur, click, rub or other extra sounds. Abdomen:  Bowel sounds positive,abdomen soft and non-tender without masses, organomegaly or hernias noted. Genitalia:  circumcised and no urethral discharge.   Prostate:  deferred to grapy Msk:  No deformity or scoliosis noted of thoracic or lumbar spine.   Pulses:  R and L carotid,radial,femoral,dorsalis pedis and posterior tibial pulses are full and equal bilaterally Extremities:  No clubbing, cyanosis, edema, or deformity noted with normal full range of motion of all joints.   Neurologic:  No cranial nerve deficits noted. Station and gait are normal. Plantar reflexes are down-going bilaterally. DTRs are symmetrical throughout. Sensory, motor and coordinative functions appear intact.   Impression & Recommendations:  Problem # 1:  PALPITATIONS, CHRONIC (ICD-785.1) Assessment New  pt has palpitations and presyncopy at gym he went to urgent care and had an EKG wih a rythm strip with was normal he did not  eat breakfast  Avoid caffeine, chocolate, and stimulants. Stress reduction as well as medication options discussed.  suspect hypoglucemia  Problem # 2:  HYPOGLYCEMIA, UNSPECIFIED (ICD-251.2) discussion of diet and the need to not exercize with out eating  Complete Medication List: 1)  Folic Acid 1 Mg Tabs (Folic acid) .... One by mouth daily 2)  Proscar 5 Mg Tabs (Finasteride) .... Take 1 tablet by mouth once a day 3)  Synthroid 25 Mcg Tabs (Levothyroxine sodium) .Marland Kitchen.. 1 tablet by mouth once a day 4)  Sertraline  Hcl 50 Mg Tabs (Sertraline hcl) .... 1/2 by mouth daily 5)  Calcium 500/d 500-200 Mg-unit Tabs (Calcium carbonate-vitamin d) .... One by mouth daily 6)  B-50 Complex Tabs (B complex-folic acid) .Marland Kitchen.. 1 once daily 7)  Analpram-hc 1-2.5 % Crea (Hydrocortisone ace-pramoxine) .... Use as directed for hemorrhoidal flare up 8)  Simvastatin 20 Mg Tabs (Simvastatin) .... Take 1 tablet by mouth each evening 9)  Prascion 10-5 % Emul (Sulfacetamide sodium-sulfur) .... Use as directed 10)  Desonide 0.05 % Crea (Desonide) .... Use as directed 11)  Ascriptin 325 Mg Tabs (Aspirin buf(alhyd-mghyd-cacar)) 12)  Fluticasone Propionate 50 Mcg/act Susp (Fluticasone propionate) .... 2 sprays in each nostril daily for allergy symptoms 13)  Androgel Pump 1 % Gel (Testosterone) .... 4 pumps once daily  Patient Instructions: 1)  keep appointment

## 2010-04-24 NOTE — Therapy (Signed)
Summary: Audiological Evaluation/[facility]  Audiological Evaluation/[facility]   Imported By: Maryln Gottron 01/04/2010 08:50:50  _____________________________________________________________________  External Attachment:    Type:   Image     Comment:   External Document

## 2010-04-24 NOTE — Progress Notes (Signed)
Summary: requesting copy of lab report  Phone Note Call from Patient Call back at Home Phone [phone]   Caller: [facility] mail Reason for Call: Acute Illness Summary of Call: pt is to see Dr Jessy Oto tomorrow. He is requesting a copy of his lab report today to give to the Dr. Initial call taken by: Warnell Forester,  January 08, 2010 8:51 AM  Follow-up for Phone Call        labs were faxed Follow-up by: Willy Eddy, LPN,  January 08, 2010 9:05 AM

## 2010-04-24 NOTE — Assessment & Plan Note (Signed)
Summary: M2A/FUP/RCD rsc per pt/njr   Vital Signs:  Patient Profile:   75 Years Old Male Height:     69 inches Weight:      185 pounds Temp:     98.4 degrees F oral Pulse rate:   78 / minute Pulse rhythm:   regular BP sitting:   122 / 56  Vitals Entered By: Lynann Beaver CMA (April 23, 2007 10:02 AM)                 Chief Complaint:  rov.  History of Present Illness: Current Problems:  DEPRESSION, MILD (ICD-311) anxiety is not controlled with the zoloft LOW BACK PAIN (ICD-724.2) no current back pain OSTEOARTHRITIS (ICD-715.90)   stable BENIGN PROSTATIC HYPERTROPHY, WITH OBSTRUCTION (ICD-600.01) HYPERGLYCEMIA, BORDERLINE (ICD-790.29)   diet controlled INTERNAL HEMORRHOIDS (ICD-455.0) HYPOTHYROIDISM (ICD-244.9) with normal TSH OVERACTIVE BLADDER (ICD-596.51)  stable HYPERLIPIDEMIA (ICD-272.4)  on medications     Lipid Management History:      Positive NCEP/ATP III risk factors include male age 21 years old or older and HDL cholesterol less than 40.  Negative NCEP/ATP III risk factors include no family history for ischemic heart disease and non-tobacco-user status.     Current Allergies: PCN     Review of Systems  The patient denies anorexia, weight loss, vision loss, decreased hearing, hoarseness, chest pain, syncope, dyspnea on exhertion, peripheral edema, prolonged cough, hemoptysis, abdominal pain, melena, hematochezia, and severe indigestion/heartburn.     Physical Exam  General:     Well-developed,well-nourished,in no acute distress; alert,appropriate and cooperative throughout examination Head:     Normocephalic and atraumatic without obvious abnormalities. No apparent alopecia or balding. Ears:     External ear exam shows no significant lesions or deformities.  Otoscopic examination reveals clear canals, tympanic membranes are intact bilaterally without bulging, retraction, inflammation or discharge. Hearing is grossly normal bilaterally. Mouth:   Oral mucosa and oropharynx without lesions or exudates.  Teeth in good repair. Neck:     No deformities, masses, or tenderness noted. Lungs:     Normal respiratory effort, chest expands symmetrically. Lungs are clear to auscultation, no crackles or wheezes. Heart:     Normal rate and regular rhythm. S1 and S2 normal without gallop, murmur, click, rub or other extra sounds. Abdomen:     Bowel sounds positive,abdomen soft and non-tender without masses, organomegaly or hernias noted. Msk:     No deformity or scoliosis noted of thoracic or lumbar spine.      Impression & Recommendations:  Problem # 1:  BENIGN PROSTATIC HYPERTROPHY, WITH OBSTRUCTION (ICD-600.01)  Problem # 2:  DEPRESSION, MILD (ICD-311) Assessment: Improved  His updated medication list for this problem includes:    Alprazolam 0.25 Mg Tabs (Alprazolam) ..... One by mouth q 4-6 hours prn    Zoloft 50 Mg Tab (Sertraline hcl) .Marland Kitchen... Take 1 tablet by mouth daily Discussed treatment options, including trial of antidpressant medication. Will refer to behavioral health. Follow-up call in in 24-48 hours and recheck in 2 weeks, sooner as needed. Patient agrees to call if any worsening of symptoms or thoughts of doing harm arise. Verified that the patient has no suicidal ideation at this time.   Problem # 3:  HYPOTHYROIDISM (ICD-244.9)  His updated medication list for this problem includes:    Synthroid 25 Mcg Tabs (Levothyroxine sodium) .Marland Kitchen... 1 tablet by mouth once a day  Labs Reviewed: TSH: 4.23 (02/03/2007)    HgBA1c: 5.5 (10/07/2006) Chol: 108 (02/03/2007)   HDL: 26.4 (02/03/2007)  LDL: 65 (02/03/2007)   TG: 85 (02/03/2007)   Problem # 4:  LOW BACK PAIN (ICD-724.2) Discussed use of moist heat or ice, modified activities, medications, and stretching/strengthening exercises. Back care instructions given. To be seen in 2 weeks if no improvement; sooner if worsening of symptoms.   Problem # 5:  HYPERGLYCEMIA, BORDERLINE  (ICD-790.29)  Labs Reviewed: HgBA1c: 5.5 (10/07/2006)   Creat: 0.8 (02/03/2007)      Problem # 6:  HYPOTHYROIDISM (ICD-244.9)  His updated medication list for this problem includes:    Synthroid 25 Mcg Tabs (Levothyroxine sodium) .Marland Kitchen... 1 tablet by mouth once a day  Labs Reviewed: TSH: 4.23 (02/03/2007)    HgBA1c: 5.5 (10/07/2006) Chol: 108 (02/03/2007)   HDL: 26.4 (02/03/2007)   LDL: 65 (02/03/2007)   TG: 85 (02/03/2007)   Problem # 7:  HYPOGONADISM, MALE (ICD-257.2)  Orders: TLB-Testosterone, Total (84403-TESTO)   Problem # 8:  DEPRESSION, MILD (ICD-311) want to go off the zoloft His updated medication list for this problem includes:    Alprazolam 0.25 Mg Tabs (Alprazolam) ..... One by mouth q 4-6 hours prn    Zoloft 50 Mg Tab (Sertraline hcl) .Marland Kitchen... Take 1 tablet by mouth daily   Problem # 9:  BENIGN PROSTATIC HYPERTROPHY, WITH OBSTRUCTION (ICD-600.01) hold the enablix andd see how the flow works  Complete Medication List: 1)  Enablex 15 Mg Tb24 (Darifenacin hydrobromide) .... Take 1 tablet by mouth once a day 2)  Folic Acid 1 Mg Tabs (Folic acid) .... One by mouth daily 3)  Proscar 5 Mg Tabs (Finasteride) .... Take 1 tablet by mouth once a day 4)  Synthroid 25 Mcg Tabs (Levothyroxine sodium) .Marland Kitchen.. 1 tablet by mouth once a day 5)  Vytorin 10-20 Mg Tabs (Ezetimibe-simvastatin) .... One by mouth daily 6)  Alprazolam 0.25 Mg Tabs (Alprazolam) .... One by mouth q 4-6 hours prn 7)  Zoloft 50 Mg Tab (Sertraline hcl) .... Take 1 tablet by mouth daily  Lipid Assessment/Plan:      Based on NCEP/ATP III, the patient's risk factor category is "2 or more risk factors and a calculated 10 year CAD risk of < 20%".  From this information, the patient's calculated lipid goals are as follows: Total cholesterol goal is 200; LDL cholesterol goal is 130; HDL cholesterol goal is 40; Triglyceride goal is 150.     Patient Instructions: 1)  to go off the zoloft take one half tablet daily for 1  week the stop 2)  continue alpraxolam as needs 3)  Hold the enablix  for now , resume if flow is compromised or night 4)  time urination is increased and urgency increased 5)  Please schedule a follow-up appointment in 2 months.    Prescriptions: ALPRAZOLAM 0.25 MG  TABS (ALPRAZOLAM) one by mouth q 4-6 hours prn  #30 x 4   Entered and Authorized by:   Stacie Glaze MD   Signed by:   Stacie Glaze MD on 04/23/2007   Method used:   Print then Give to Patient   RxID:   [id number]  ]

## 2010-04-24 NOTE — Assessment & Plan Note (Signed)
Summary: flu shot/njr   Nurse Visit    Prior Medications: FOLIC ACID 1 MG TABS (FOLIC ACID) one by mouth daily PROSCAR 5 MG TABS (FINASTERIDE) Take 1 tablet by mouth once a day SYNTHROID 25 MCG TABS (LEVOTHYROXINE SODIUM) 1 tablet by mouth once a day SERTRALINE HCL 25 MG TABS (SERTRALINE HCL) one by mouth daily CALCIUM 500/D 500-200 MG-UNIT  TABS (CALCIUM CARBONATE-VITAMIN D) one by mouth daily B-50 COMPLEX  TABS (B COMPLEX-FOLIC ACID) 1 once daily ANALPRAM-HC 1-2.5 % CREA (HYDROCORTISONE ACE-PRAMOXINE) Use as directed for hemorrhoidal flare up CLINDAMYCIN PHOSPHATE 1 % SOLN (CLINDAMYCIN PHOSPHATE) Use as directed SIMVASTATIN 20 MG TABS (SIMVASTATIN) Take 1 tablet by mouth each evening DIAZEPAM 5 MG TABS (DIAZEPAM) 1/2 to one by mouth q HS Current Allergies: PCN    Orders Added: 1)  Flu Vaccine 105yrs + [[address]] 2)  Admin of Therapeutic Inj (IM or [place]) [email or web address]    ]  Impression & Recommendations: Flu Vaccine Consent Questions     Do you have a history of severe allergic reactions to this vaccine? no    Any prior history of allergic reactions to egg and/or gelatin? no    Do you have a sensitivity to the preservative Thimersol? no    Do you have a past history of Guillan-Barre Syndrome? no    Do you currently have an acute febrile illness? no    Have you ever had a severe reaction to latex? no    Vaccine information given and explained to patient? yes    Are you currently pregnant? no    Lot Number:[id number]   Site Given  Left Deltoid IM   Complete Medication List: 1)  Folic Acid 1 Mg Tabs (Folic acid) .... One by mouth daily 2)  Proscar 5 Mg Tabs (Finasteride) .... Take 1 tablet by mouth once a day 3)  Synthroid 25 Mcg Tabs (Levothyroxine sodium) .Marland Kitchen.. 1 tablet by mouth once a day 4)  Sertraline Hcl 25 Mg Tabs (Sertraline hcl) .... One by mouth daily 5)  Calcium 500/d 500-200 Mg-unit Tabs (Calcium carbonate-vitamin d) .... One by mouth daily 6)  B-50 Complex Tabs (B  complex-folic acid) .Marland Kitchen.. 1 once daily 7)  Analpram-hc 1-2.5 % Crea (Hydrocortisone ace-pramoxine) .... Use as directed for hemorrhoidal flare up 8)  Clindamycin Phosphate 1 % Soln (Clindamycin phosphate) .... Use as directed 9)  Simvastatin 20 Mg Tabs (Simvastatin) .... Take 1 tablet by mouth each evening 10)  Diazepam 5 Mg Tabs (Diazepam) .... 1/2 to one by mouth q hs

## 2010-04-24 NOTE — Progress Notes (Signed)
Summary: refill  Phone Note Refill Request Message from:  Fax from Pharmacy on August 23, 2009 4:23 PM  Refills Requested: Medication #1:  PROSCAR 5 MG TABS Take 1 tablet by mouth once a day Initial call taken by: Kern Reap CMA Duncan Dull),  August 23, 2009 4:24 PM    Prescriptions: PROSCAR 5 MG TABS (FINASTERIDE) Take 1 tablet by mouth once a day  #90 x 1   Entered by:   Kern Reap CMA (AAMA)   Authorized by:   Stacie Glaze MD   Signed by:   Kern Reap CMA (AAMA) on 08/23/2009   Method used:   Faxed to ...       [facility] (mail-order)       [address] [place], [place]  [address]       Ph: [phone]       Fax: [phone]   RxID:   [id number]

## 2010-04-24 NOTE — Assessment & Plan Note (Signed)
Summary: 3 month f/u//ca   Vital Signs:  Patient Profile:   75 Years Old Male Height:     69 inches Weight:      190 pounds BMI:     28.16 Temp:     98.2 degrees F oral Resp:     14 per minute BP sitting:   140 / 80  (left arm)  Vitals Entered By: Willy Eddy, LPN (September 17, 2007 8:11 AM)                 Chief Complaint:  roa labs.  History of Present Illness:  Follow-Up Visit      This is a 75 year old man who presents for Follow-up visit.  The patient complains of high blood sugar symptoms, but denies chest pain, palpitations, dizziness, syncope, low blood sugar symptoms, edema, SOB, DOE, PND, and orthopnea.  Since the last visit the patient notes no new problems or concerns.  The patient reports taking meds as prescribed, monitoring BP, and dietary noncompliance.  When questioned about possible medication side effects, the patient notes none.      Current Allergies: PCN  Past Medical History:    Reviewed history from 02/10/2007 and no changes required:       Hypothyroidism       hyperlipidemia       hemorrhiods       overactive bladder       Diabetes mellitus, type II       Osteoarthritis       Low back pain  Past Surgical History:    Reviewed history from 02/10/2007 and no changes required:       index finger right hand       Tonsillectomy       FX GREAT TOE   Family History:    Reviewed history from 10/14/2006 and no changes required:       father died from CVA at 30       Mother       Family History Lung cancer  Social History:    Reviewed history from 10/14/2006 and no changes required:       Retired       Married       Former Smoker quit for 40 years       Alcohol use-yes       Drug use-no       Regular exercise-yes    Review of Systems  The patient denies anorexia, fever, weight loss, weight gain, vision loss, decreased hearing, hoarseness, chest pain, syncope, dyspnea on exertion, peripheral edema, prolonged cough, headaches,  hemoptysis, abdominal pain, melena, hematochezia, severe indigestion/heartburn, hematuria, incontinence, genital sores, muscle weakness, suspicious skin lesions, transient blindness, difficulty walking, depression, unusual weight change, abnormal bleeding, enlarged lymph nodes, angioedema, breast masses, and testicular masses.     Physical Exam  General:     Well-developed,well-nourished,in no acute distress; alert,appropriate and cooperative throughout examination Head:     Normocephalic and atraumatic without obvious abnormalities. No apparent alopecia or balding. Eyes:     No corneal or conjunctival inflammation noted. EOMI. Perrla. Funduscopic exam benign, without hemorrhages, exudates or papilledema. Vision grossly normal. Ears:     External ear exam shows no significant lesions or deformities.  Otoscopic examination reveals clear canals, tympanic membranes are intact bilaterally without bulging, retraction, inflammation or discharge. Hearing is grossly normal bilaterally. Mouth:     Oral mucosa and oropharynx without lesions or exudates.  Teeth in good repair. Neck:  No deformities, masses, or tenderness noted. Lungs:     Normal respiratory effort, chest expands symmetrically. Lungs are clear to auscultation, no crackles or wheezes. Heart:     Normal rate and regular rhythm. S1 and S2 normal without gallop, murmur, click, rub or other extra sounds. Abdomen:     Bowel sounds positive,abdomen soft and non-tender without masses, organomegaly or hernias noted. Msk:     No deformity or scoliosis noted of thoracic or lumbar spine.   Pulses:     R and L carotid,radial,femoral,dorsalis pedis and posterior tibial pulses are full and equal bilaterally Extremities:     No clubbing, cyanosis, edema, or deformity noted with normal full range of motion of all joints.   Neurologic:     No cranial nerve deficits noted. Station and gait are normal. Plantar reflexes are down-going bilaterally.  DTRs are symmetrical throughout. Sensory, motor and coordinative functions appear intact.    Impression & Recommendations:  Problem # 1:  HYPOTHYROIDISM (ICD-244.9)  His updated medication list for this problem includes:    Synthroid 25 Mcg Tabs (Levothyroxine sodium) .Marland Kitchen... 1 tablet by mouth once a day  Labs Reviewed: TSH: 3.44 (06/18/2007)    HgBA1c: 5.5 (10/07/2006) Chol: 111 (09/10/2007)   HDL: 25.3 (09/10/2007)   LDL: 57 (09/10/2007)   TG: 141 (09/10/2007)   Problem # 2:  OSTEOARTHRITIS (ICD-715.90) Discussed use of medications, application of heat or cold, and exercises.   Problem # 3:  HYPERGLYCEMIA, BORDERLINE (ICD-790.29)  Problem # 4:  UNS ADVRS EFF UNS RX MEDICINAL&BIOLOGICAL SBSTNC (ICD-995.20) enablix had increased constipation the flow is stable  Complete Medication List: 1)  Folic Acid 1 Mg Tabs (Folic acid) .... One by mouth daily 2)  Proscar 5 Mg Tabs (Finasteride) .... Take 1 tablet by mouth once a day 3)  Synthroid 25 Mcg Tabs (Levothyroxine sodium) .Marland Kitchen.. 1 tablet by mouth once a day 4)  Vytorin 10-20 Mg Tabs (Ezetimibe-simvastatin) .... One by mouth daily 5)  Alprazolam 0.25 Mg Tabs (Alprazolam) .... 1/2  by mouth q 4-6 hours as needed 6)  Zoloft 50 Mg Tab (Sertraline hcl) .... Take 1/2  tablet by mouth daily 7)  Calcium 500/d 500-200 Mg-unit Tabs (Calcium carbonate-vitamin d) .... One by mouth daily   Patient Instructions: 1)  Please schedule a follow-up appointment in 2 months. 2)  Hepatic Panel prior to visit, ICD-9: 3)  Lipid Panel prior to visit, ICD-9: 4)  take 1/2 by mouth every other day for 2 weeks then stop 5)  continue the aprazolam   until seen in 2 months 6)  modify fat and sugar... 7)  web for 60gm carbohydrate diet    ....   ]

## 2010-04-24 NOTE — Assessment & Plan Note (Signed)
Summary: flu shot/njr   Nurse Visit  Flu Vaccine Consent Questions     Do you have a history of severe allergic reactions to this vaccine? no    Any prior history of allergic reactions to egg and/or gelatin? no    Do you have a sensitivity to the preservative Thimersol? no    Do you have a past history of Guillan-Barre Syndrome? no    Do you currently have an acute febrile illness? no    Have you ever had a severe reaction to latex? no    Vaccine information given and explained to patient? yes    Are you currently pregnant? no    Lot Number:[id number]   Exp Date:09/21/2009   Site Given  Left Deltoid IM    Allergies: 1)  Pcn  Orders Added: 1)  Flu Vaccine 80yrs + [[address]] 2)  Administration Flu vaccine - MCR [G0008]

## 2010-04-24 NOTE — Assessment & Plan Note (Signed)
Summary: sore throat,weak/mhf   Vital Signs:  Patient Profile:   75 Years Old Male Height:     69 inches Temp:     98.7 degrees F Pulse rate:   80 / minute Resp:     16 per minute BP sitting:   138 / 80  (left arm)  Vitals Entered By: Willy Eddy, LPN (January 25, 2008 1:47 PM)                 Chief Complaint:  congestion in throat and cough and sorethroat--just returned from trip.  History of Present Illness: Pt was on a cruise ship and got home with acute sinus PNDrip, sore throat, cough and congestion No fever or chills, perspires easy, no myalgias    Current Allergies: PCN  Past Medical History:    Reviewed history from 02/10/2007 and no changes required:       Hypothyroidism       hyperlipidemia       hemorrhiods       overactive bladder       Diabetes mellitus, type II       Osteoarthritis       Low back pain  Past Surgical History:    Reviewed history from 02/10/2007 and no changes required:       index finger right hand       Tonsillectomy       FX GREAT TOE   Family History:    Reviewed history from 10/14/2006 and no changes required:       father died from CVA at 22       Mother       Family History Lung cancer  Social History:    Reviewed history from 10/14/2006 and no changes required:       Retired       Married       Former Smoker quit for 40 years       Alcohol use-yes       Drug use-no       Regular exercise-yes    Review of Systems  The patient denies anorexia, fever, weight loss, weight gain, vision loss, decreased hearing, hoarseness, chest pain, syncope, dyspnea on exertion, peripheral edema, prolonged cough, headaches, hemoptysis, abdominal pain, melena, hematochezia, severe indigestion/heartburn, hematuria, incontinence, genital sores, muscle weakness, suspicious skin lesions, transient blindness, difficulty walking, depression, unusual weight change, abnormal bleeding, enlarged lymph nodes, angioedema, and breast masses.      Physical Exam  General:     Well-developed,well-nourished,in no acute distress; alert,appropriate and cooperative throughout examination Eyes:     No corneal or conjunctival inflammation noted. EOMI. Perrla. Funduscopic exam benign, without hemorrhages, exudates or papilledema. Vision grossly normal. Nose:     nasal dischargemucosal pallor, mucosal edema, and airflow obstruction.   Mouth:     posterior lymphoid hypertrophy and postnasal drip.   Neck:     No deformities, masses, or tenderness noted. Lungs:     Normal respiratory effort, chest expands symmetrically. Lungs are clear to auscultation, no crackles or wheezes. Heart:     Normal rate and regular rhythm. S1 and S2 normal without gallop, murmur, click, rub or other extra sounds. Abdomen:     Bowel sounds positive,abdomen soft and non-tender without masses, organomegaly or hernias noted.    Impression & Recommendations:  Problem # 1:  UPPER RESPIRATORY INFECTION, ACUTE, WITH BRONCHITIS (ICD-465.9) Instructed on symptomatic treatment. Call if symptoms persist or worsen.  URI moset probably viral zpack to  hold  His updated medication list for this problem includes:    Allegra-d 12 Hour 60-120 Mg Xr12h-tab (Fexofenadine-pseudoephedrine) ..... One by mouth bid   Complete Medication List: 1)  Folic Acid 1 Mg Tabs (Folic acid) .... One by mouth daily 2)  Proscar 5 Mg Tabs (Finasteride) .... Take 1 tablet by mouth once a day 3)  Synthroid 25 Mcg Tabs (Levothyroxine sodium) .Marland Kitchen.. 1 tablet by mouth once a day 4)  Sertraline Hcl 25 Mg Tabs (Sertraline hcl) .... One by mouth daily 5)  Calcium 500/d 500-200 Mg-unit Tabs (Calcium carbonate-vitamin d) .... One by mouth daily 6)  B-50 Complex Tabs (B complex-folic acid) .Marland Kitchen.. 1 once daily 7)  Analpram-hc 1-2.5 % Crea (Hydrocortisone ace-pramoxine) .... Use as directed for hemorrhoidal flare up 8)  Clindamycin Phosphate 1 % Soln (Clindamycin phosphate) .... Use as directed 9)   Simvastatin 20 Mg Tabs (Simvastatin) .... Take 1 tablet by mouth each evening 10)  Diazepam 5 Mg Tabs (Diazepam) .... 1/2 to one by mouth q hs 11)  Zithromax 250 Mg Tab (Azithromycin) .... Take two (2 ) tablets day one, then one (1) tablet a day for four (4) more days 12)  Allegra-d 12 Hour 60-120 Mg Xr12h-tab (Fexofenadine-pseudoephedrine) .... One by mouth bid   Patient Instructions: 1)  Acute sinusitis symptoms for less than 10 days are not helped by antibiotics.Use warm moist compresses, and over the counter decongestants ( only as directed). Call if no improvement in 5-7 days, sooner if increasing pain, fever, or new symptoms.   Prescriptions: ALLEGRA-D 12 HOUR 60-120 MG XR12H-TAB (FEXOFENADINE-PSEUDOEPHEDRINE) one by mouth BID  #30 x 2   Entered and Authorized by:   Stacie Glaze MD   Signed by:   Stacie Glaze MD on 01/25/2008   Method used:   Print then Give to Patient   RxID:   [id number] ZITHROMAX 250 MG TAB (AZITHROMYCIN) Take two (2 ) tablets day one, then one (1) tablet a day for four (4) more days  #6 x 0   Entered and Authorized by:   Stacie Glaze MD   Signed by:   Stacie Glaze MD on 01/25/2008   Method used:   Print then Give to Patient   RxID:   [id number]  ]  Appended Document: Orders Update     Clinical Lists Changes  Orders: Added new Service order of Est. Patient Level III (24401) - Signed

## 2010-04-24 NOTE — Procedures (Signed)
Summary: colonoscopy  colonoscopy   Imported By: Kassie Mends 04/15/2007 13:56:58  _____________________________________________________________________  External Attachment:    Type:   Image     Comment:   colonoscopy

## 2010-04-24 NOTE — Progress Notes (Signed)
  Phone Note Call from Patient Call back at Home Phone [phone]   Caller: Patient Summary of Call: bad cold and wants to be seen told to come in around 10AM Initial call taken by: Cindee Salt MD,  March 26, 2008 9:16 AM

## 2010-04-24 NOTE — Assessment & Plan Note (Signed)
Summary: 3 month rov/njr---PT [facility] // RS   Vital Signs:  Patient profile:   75 year old male Height:      69 inches Weight:      188 pounds BMI:     27.86 Temp:     98.2 degrees F oral Pulse rate:   68 / minute Resp:     14 per minute BP sitting:   140 / 80  (left arm)  Vitals Entered By: Willy Eddy, LPN (January 23, 2010 3:02 PM) CC: roa labs, Lipid Management Is Patient Diabetic? No   Primary Care [place] Prevo:  Stacie Glaze MD  CC:  roa labs and Lipid Management.  History of Present Illness: follow up HLA B27 positive carple tunnel EMG positive surgery recomendation for release Hand swelling trial of anti arthritic Plaquinil consider embrel?   Lipid Management History:      Positive NCEP/ATP III risk factors include male age 59 years old or older and HDL cholesterol less than 40.  Negative NCEP/ATP III risk factors include no family history for ischemic heart disease and non-tobacco-user status.     Preventive Screening-Counseling & Management  Alcohol-Tobacco     Alcohol drinks/day: 1     Smoking Status: quit     Year Quit: 1970     Pack years: 2     Passive Smoke Exposure: no     Tobacco Counseling: to remain off tobacco products  Problems Prior to Update: 1)  ? of Ankylosing Spondylitis  (ICD-720.0) 2)  Carpal Tunnel Syndrome, Bilateral  (ICD-354.0) 3)  Hand Pain, Left  (ICD-729.5) 4)  Hearing Loss  (ICD-389.9) 5)  Metabolic Syndrome X  (ICD-277.7) 6)  Hypoglycemia, Unspecified  (ICD-251.2) 7)  Palpitations, Chronic  (ICD-785.1) 8)  Allergic Rhinitis  (ICD-477.9) 9)  Sinusitis- Acute-nos  (ICD-461.9) 10)  Upper Respiratory Infection, Acute, With Bronchitis  (ICD-465.9) 11)  Uns Advrs Eff Uns Rx Medicinal&biological Sbstnc  (ICD-995.20) 12)  Nose Fracture  (ICD-802.0) 13)  Hypogonadism, Male  (ICD-257.2) 14)  Depression, Mild  (ICD-311) 15)  Low Back Pain  (ICD-724.2) 16)  Osteoarthritis  (ICD-715.90) 17)  Benign Prostatic Hypertrophy, With  Obstruction  (ICD-600.01) 18)  Hyperglycemia, Borderline  (ICD-790.29) 19)  Examination, Routine Medical  (ICD-V70.0) 20)  Internal Hemorrhoids  (ICD-455.0) 21)  Hypothyroidism  (ICD-244.9) 22)  Overactive Bladder  (ICD-596.51) 23)  Hyperlipidemia  (ICD-272.4)  Current Problems (verified): 1)  Hand Pain, Left  (ICD-729.5) 2)  Hearing Loss  (ICD-389.9) 3)  Metabolic Syndrome X  (ICD-277.7) 4)  Hypoglycemia, Unspecified  (ICD-251.2) 5)  Palpitations, Chronic  (ICD-785.1) 6)  Allergic Rhinitis  (ICD-477.9) 7)  Sinusitis- Acute-nos  (ICD-461.9) 8)  Upper Respiratory Infection, Acute, With Bronchitis  (ICD-465.9) 9)  Uns Advrs Eff Uns Rx Medicinal&biological Sbstnc  (ICD-995.20) 10)  Nose Fracture  (ICD-802.0) 11)  Hypogonadism, Male  (ICD-257.2) 12)  Depression, Mild  (ICD-311) 13)  Low Back Pain  (ICD-724.2) 14)  Osteoarthritis  (ICD-715.90) 15)  Benign Prostatic Hypertrophy, With Obstruction  (ICD-600.01) 16)  Hyperglycemia, Borderline  (ICD-790.29) 17)  Examination, Routine Medical  (ICD-V70.0) 18)  Internal Hemorrhoids  (ICD-455.0) 19)  Hypothyroidism  (ICD-244.9) 20)  Overactive Bladder  (ICD-596.51) 21)  Hyperlipidemia  (ICD-272.4)  Medications Prior to Update: 1)  Folic Acid 1 Mg Tabs (Folic Acid) .... One By Mouth Daily 2)  Proscar 5 Mg Tabs (Finasteride) .... Take 1 Tablet By Mouth Once A Day 3)  Synthroid 50 Mcg Tabs (Levothyroxine Sodium) .... One By Mouth Daily 4)  B-50 Complex  Tabs (B Complex-Folic Acid) .Marland Kitchen.. 1 Once Daily 5)  Analpram-Hc 1-2.5 % Crea (Hydrocortisone Ace-Pramoxine) .... Use As Directed For Hemorrhoidal Flare Up 6)  Simvastatin 20 Mg Tabs (Simvastatin) .... Take 1 Tablet By Mouth Each Evening 7)  Prascion 10-5 % Emul (Sulfacetamide Sodium-Sulfur) .... Use As Directed 8)  Desonide 0.05 % Crea (Desonide) .... Use As Directed 9)  Aspirin 81 Mg Tabs (Aspirin) .Marland Kitchen.. 1 Once Daily 10)  Fluticasone Propionate 50 Mcg/act Susp (Fluticasone Propionate) .... 2  Sprays in Each Nostril Daily For Allergy Symptoms 11)  Androgel Pump 1 % Gel (Testosterone) .... 3 Pumps Once Daily  Current Medications (verified): 1)  Folic Acid 1 Mg Tabs (Folic Acid) .... One By Mouth Daily 2)  Proscar 5 Mg Tabs (Finasteride) .... Take 1 Tablet By Mouth Once A Day 3)  Synthroid 50 Mcg Tabs (Levothyroxine Sodium) .... One By Mouth Daily 4)  B-50 Complex  Tabs (B Complex-Folic Acid) .Marland Kitchen.. 1 Once Daily 5)  Analpram-Hc 1-2.5 % Crea (Hydrocortisone Ace-Pramoxine) .... Use As Directed For Hemorrhoidal Flare Up 6)  Simvastatin 20 Mg Tabs (Simvastatin) .... Take 1 Tablet By Mouth Each Evening 7)  Prascion 10-5 % Emul (Sulfacetamide Sodium-Sulfur) .... Use As Directed 8)  Desonide 0.05 % Crea (Desonide) .... Use As Directed 9)  Aspirin 81 Mg Tabs (Aspirin) .Marland Kitchen.. 1 Once Daily 10)  Fluticasone Propionate 50 Mcg/act Susp (Fluticasone Propionate) .... 2 Sprays in Each Nostril Daily For Allergy Symptoms 11)  Androgel Pump 1 % Gel (Testosterone) .... 3 Pumps Once Daily 12)  Voltaren 1 % Gel (Diclofenac Sodium) .... As Directed 13)  Plaquenil 200 Mg Tabs (Hydroxychloroquine Sulfate) .... One By Mouth Two Times A Day  Allergies (verified): 1)  Pcn  Past History:  Family History: Last updated: Oct 19, 2006 father died from CVA at 86 Mother Family History Lung cancer  Social History: Last updated: 2006/10/19 Retired Married Former Smoker quit for 40 years Alcohol use-yes Drug use-no Regular exercise-yes  Risk Factors: Alcohol Use: 1 (01/23/2010) Caffeine Use: 1-2 cupps a day (2009/10/18) Exercise: yes (10-19-2006)  Risk Factors: Smoking Status: quit (01/23/2010) Passive Smoke Exposure: no (01/23/2010)  Past medical, surgical, family and social histories (including risk factors) reviewed, and no changes noted (except as noted below).  Past Medical History: Reviewed history from 03/26/2008 and no changes required. Hypothyroidism hyperlipidemia hemorrhiods overactive  bladder Diabetes mellitus, type II Osteoarthritis Low back pain Allergic rhinitis  Past Surgical History: Reviewed history from 02/10/2007 and no changes required. index finger right hand Tonsillectomy FX GREAT TOE  Family History: Reviewed history from October 19, 2006 and no changes required. father died from CVA at 4 Mother Family History Lung cancer  Social History: Reviewed history from October 19, 2006 and no changes required. Retired Married Former Smoker quit for 40 years Alcohol use-yes Drug use-no Regular exercise-yes  Review of Systems  The patient denies anorexia, fever, weight loss, weight gain, vision loss, decreased hearing, hoarseness, chest pain, syncope, dyspnea on exertion, peripheral edema, prolonged cough, headaches, hemoptysis, abdominal pain, melena, hematochezia, severe indigestion/heartburn, hematuria, incontinence, genital sores, muscle weakness, suspicious skin lesions, transient blindness, difficulty walking, depression, unusual weight change, abnormal bleeding, enlarged lymph nodes, angioedema, breast masses, and testicular masses.    Physical Exam  General:  alert.  NADwell-developed and well-hydrated.   Head:  atraumatic and male-pattern balding.   Eyes:  No corneal or conjunctival inflammation noted. EOMI. Perrla. Funduscopic exam benign, without hemorrhages, exudates or papilledema. Vision grossly normal. Ears:  R ear normal and L ear  normal.   Nose:  no external deformity and no nasal discharge.   Mouth:  no erythema and no exudates.   Neck:  supple and no cervical lymphadenopathy.   Lungs:  normal respiratory effort, normal breath sounds, no crackles, and no wheezes.   Heart:  Normal rate and regular rhythm. S1 and S2 normal without gallop, murmur, click, rub or other extra sounds. Abdomen:  Bowel sounds positive,abdomen soft and non-tender without masses, organomegaly or hernias noted.   Impression & Recommendations:  Problem # 1:  HYPOGONADISM,  MALE (ICD-257.2) moniter testosterone  Problem # 2:  LOW BACK PAIN (ICD-724.2)  His updated medication list for this problem includes:    Aspirin 81 Mg Tabs (Aspirin) .Marland Kitchen... 1 once daily  Discussed use of moist heat or ice, modified activities, medications, and stretching/strengthening exercises. Back care instructions given. To be seen in 2 weeks if no improvement; sooner if worsening of symptoms.   Problem # 3:  METABOLIC SYNDROME X (ICD-277.7) prediabetic, diet and weight loss  Problem # 4:  HYPERLIPIDEMIA (ICD-272.4)  His updated medication list for this problem includes:    Simvastatin 20 Mg Tabs (Simvastatin) .Marland Kitchen... Take 1 tablet by mouth each evening  Problem # 5:  OSTEOARTHRITIS (ICD-715.90)  His updated medication list for this problem includes:    Aspirin 81 Mg Tabs (Aspirin) .Marland Kitchen... 1 once daily  Problem # 6:  CARPAL TUNNEL SYNDROME, BILATERAL (ICD-354.0)  Problem # 7:  ? of ANKYLOSING SPONDYLITIS (ICD-720.0)  spondylitis in neck  Orders: T-Sacroiliac Joints (72202TC)  Complete Medication List: 1)  Folic Acid 1 Mg Tabs (Folic acid) .... One by mouth daily 2)  Proscar 5 Mg Tabs (Finasteride) .... Take 1 tablet by mouth once a day 3)  Synthroid 50 Mcg Tabs (Levothyroxine sodium) .... One by mouth daily 4)  B-50 Complex Tabs (B complex-folic acid) .Marland Kitchen.. 1 once daily 5)  Analpram-hc 1-2.5 % Crea (Hydrocortisone ace-pramoxine) .... Use as directed for hemorrhoidal flare up 6)  Simvastatin 20 Mg Tabs (Simvastatin) .... Take 1 tablet by mouth each evening 7)  Prascion 10-5 % Emul (Sulfacetamide sodium-sulfur) .... Use as directed 8)  Desonide 0.05 % Crea (Desonide) .... Use as directed 9)  Aspirin 81 Mg Tabs (Aspirin) .Marland Kitchen.. 1 once daily 10)  Fluticasone Propionate 50 Mcg/act Susp (Fluticasone propionate) .... 2 sprays in each nostril daily for allergy symptoms 11)  Androgel Pump 1 % Gel (Testosterone) .... 3 pumps once daily 12)  Voltaren 1 % Gel (Diclofenac sodium) .... As  directed 13)  Plaquenil 200 Mg Tabs (Hydroxychloroquine sulfate) .... One by mouth two times a day  Lipid Assessment/Plan:      Based on NCEP/ATP III, the patient's risk factor category is "2 or more risk factors and a calculated 10 year CAD risk of < 20%".  The patient's lipid goals are as follows: Total cholesterol goal is 200; LDL cholesterol goal is 130; HDL cholesterol goal is 40; Triglyceride goal is 150.  His LDL cholesterol goal has been met.    Patient Instructions: 1)  ankylosing spondylitis??? 2)  will do a trial of plaquinil 3)  consider embrel 4)  will have spine films done 5)  Please schedule a follow-up appointment in 2 months. Prescriptions: PLAQUENIL 200 MG TABS (HYDROXYCHLOROQUINE SULFATE) one by mouth two times a day  #60 x 3   Entered and Authorized by:   Stacie Glaze MD   Signed by:   Stacie Glaze MD on 01/23/2010   Method used:  Electronically to        [facility]. [phone]* (retail)       [phone]       [place], [place]  [address]       Ph: [phone]       Fax: [phone]   RxID:   [id number] PLAQUENIL 200 MG TABS (HYDROXYCHLOROQUINE SULFATE) one by mouth two times a day  #60 x 3   Entered and Authorized by:   Stacie Glaze MD   Signed by:   Stacie Glaze MD on 01/23/2010   Method used:   Electronically to        [facility] (mail-order)       [address]       [place], [place]  [address]       Ph: [phone]       Fax: [phone]   RxID:   [id number] PROSCAR 5 MG TABS (FINASTERIDE) Take 1 tablet by mouth once a day  #90 x 3   Entered by:   Willy Eddy, LPN   Authorized by:   Stacie Glaze MD   Signed by:   Willy Eddy, LPN on 93/23/5573   Method used:   Electronically to        [facility] (mail-order)       [address]       [place], [place]  [address]       Ph: [phone]       Fax: [phone]   RxID:   [id number]    Orders  Added: 1)  T-Sacroiliac Joints [72202TC] 2)  Est. Patient Level IV [03500]

## 2010-04-24 NOTE — Consult Note (Signed)
Summary: [facility]  [facility]   Imported By: Maryln Gottron 12/30/2007 14:00:11  _____________________________________________________________________  External Attachment:    Type:   Image     Comment:   External Document

## 2010-04-24 NOTE — Assessment & Plan Note (Signed)
Summary: URI? HEAD COUGH/BLOOD IN MUCUS/KDC   Vital Signs:  Patient Profile:   75 Years Old Male Height:     69 inches Weight:      190 pounds O2 Sat:      97 % Temp:     97.9 degrees F oral Resp:     16 per minute BP sitting:   100 / 70  (left arm)  Vitals Entered By: Doristine Devoid (March 26, 2008 10:26 AM)                 Chief Complaint:  cough and congestion .  History of Present Illness: Had resp infection in Novemver better with erytrhomycin  Takes allegra at times  has had some symptoms like a cold in the last few days  bad head congestion some coughing No fever but has had sweats at night No SOB Cough is productive of slight amount of phlegm slight blood in nasal mucus when sneezing  trying loratadine, delsym OTC with slight help  Has had flu and H1N1 vaccines--some time ago    Current Allergies: PCN  Past Medical History:    Hypothyroidism    hyperlipidemia    hemorrhiods    overactive bladder    Diabetes mellitus, type II    Osteoarthritis    Low back pain    Allergic rhinitis  Past Surgical History:    Reviewed history from 02/10/2007 and no changes required:       index finger right hand       Tonsillectomy       FX GREAT TOE   Social History:    Reviewed history from 10/14/2006 and no changes required:       Retired       Married       Former Smoker quit for 40 years       Alcohol use-yes       Drug use-no       Regular exercise-yes    Review of Systems       No N/V No diarrhea Appetite is fine   Physical Exam  General:     alert.  NAD Head:     mild sinus tenderness Ears:     R ear normal and L ear normal.   Nose:     mod congestion Mouth:     no erythema and no exudates.   Neck:     supple and no cervical lymphadenopathy.   Lungs:     normal respiratory effort, normal breath sounds, no crackles, and no wheezes.      Impression & Recommendations:  Problem # 1:  SINUSITIS- ACUTE-NOS  (ICD-461.9) Assessment: New not clear if this is viral or bacterial some allergic component  P: will try fluticasone spray     Rx for z-pak  in case things worsen His updated medication list for this problem includes:    Zithromax Z-pak 250 Mg Tabs (Azithromycin) .Marland Kitchen... Take as directed for sinus infection    Fluticasone Propionate 50 Mcg/act Susp (Fluticasone propionate) .Marland Kitchen... 2 sprays in each nostril daily for allergy symptoms   Problem # 2:  ALLERGIC RHINITIS (ICD-477.9) Assessment: New has had ongoing problems and notes this every winter will try the flonase he should try the allegra (he hasn't yet) His updated medication list for this problem includes:    Fluticasone Propionate 50 Mcg/act Susp (Fluticasone propionate) .Marland Kitchen... 2 sprays in each nostril daily for allergy symptoms   Complete Medication List: 1)  Folic  Acid 1 Mg Tabs (Folic acid) .... One by mouth daily 2)  Proscar 5 Mg Tabs (Finasteride) .... Take 1 tablet by mouth once a day 3)  Synthroid 25 Mcg Tabs (Levothyroxine sodium) .Marland Kitchen.. 1 tablet by mouth once a day 4)  Sertraline Hcl 50 Mg Tabs (Sertraline hcl) .Marland Kitchen.. 1 once daily 5)  Calcium 500/d 500-200 Mg-unit Tabs (Calcium carbonate-vitamin d) .... One by mouth daily 6)  B-50 Complex Tabs (B complex-folic acid) .Marland Kitchen.. 1 once daily 7)  Analpram-hc 1-2.5 % Crea (Hydrocortisone ace-pramoxine) .... Use as directed for hemorrhoidal flare up 8)  Simvastatin 20 Mg Tabs (Simvastatin) .... Take 1 tablet by mouth each evening 9)  Diazepam 5 Mg Tabs (Diazepam) .... 1/2 to one by mouth q hs 10)  Prascion 10-5 % Emul (Sulfacetamide sodium-sulfur) .... Use as directed 11)  Desonide 0.05 % Crea (Desonide) .... Use as directed 12)  Ascriptin 325 Mg Tabs (Aspirin buf(alhyd-mghyd-cacar)) 13)  Zithromax Z-pak 250 Mg Tabs (Azithromycin) .... Take as directed for sinus infection 14)  Fluticasone Propionate 50 Mcg/act Susp (Fluticasone propionate) .... 2 sprays in each nostril daily for allergy  symptoms   Patient Instructions: 1)  Please schedule a follow-up appointment as needed.   Prescriptions: FLUTICASONE PROPIONATE 50 MCG/ACT SUSP (FLUTICASONE PROPIONATE) 2 sprays in each nostril daily for allergy symptoms  #1 x 3   Entered and Authorized by:   Cindee Salt MD   Signed by:   Cindee Salt MD on 03/26/2008   Method used:   Print then Give to Patient   RxID:   [id number] ZITHROMAX Z-PAK 250 MG TABS (AZITHROMYCIN) take as directed for sinus infection  #1 x 0   Entered and Authorized by:   Cindee Salt MD   Signed by:   Cindee Salt MD on 03/26/2008   Method used:   Print then Give to Patient   RxID:   [id number]  ]

## 2010-04-24 NOTE — Progress Notes (Signed)
Summary: synthroid refill  Phone Note Refill Request Message from:  Fax from Pharmacy on August 23, 2009 4:22 PM  Refills Requested: Medication #1:  SYNTHROID 25 MCG TABS 1 tablet by mouth once a day Initial call taken by: Kern Reap CMA Duncan Dull),  August 23, 2009 4:22 PM    Prescriptions: SYNTHROID 25 MCG TABS (LEVOTHYROXINE SODIUM) 1 tablet by mouth once a day  #90 x 1   Entered by:   Kern Reap CMA (AAMA)   Authorized by:   Stacie Glaze MD   Signed by:   Kern Reap CMA (AAMA) on 08/23/2009   Method used:   Faxed to ...       [facility] (mail-order)       [address] [place], [place]  [address]       Ph: [phone]       Fax: [phone]   RxID:   [id number]

## 2010-04-24 NOTE — Assessment & Plan Note (Signed)
Summary: FLU SHOT/CCM  Nurse Visit   Impression & Recommendations: lot [id number].Exp 09/22/2007 [employer] pasteur-left deltoid IM.0.51ml   Complete Medication List: 1)  Enablex 15 Mg Tb24 (Darifenacin hydrobromide) .... Take 1 tablet by mouth once a day 2)  Folic Acid 1 Mg Tabs (Folic acid) .... One by mouth daily 3)  Proscar 5 Mg Tabs (Finasteride) .... Take 1 tablet by mouth once a day 4)  Synthroid 25 Mcg Tabs (Levothyroxine sodium) .Marland Kitchen.. 1 tablet by mouth once a day 5)  Vytorin 10-20 Mg Tabs (Ezetimibe-simvastatin) .... One by mouth daily 6)  Alprazolam 0.25 Mg Tabs (Alprazolam) .... One by mouth q 4-6 hours prn    Prior Medications: ENABLEX 15 MG TB24 (DARIFENACIN HYDROBROMIDE) Take 1 tablet by mouth once a day FOLIC ACID 1 MG TABS (FOLIC ACID) one by mouth daily PROSCAR 5 MG TABS (FINASTERIDE) Take 1 tablet by mouth once a day SYNTHROID 25 MCG TABS (LEVOTHYROXINE SODIUM) 1 tablet by mouth once a day VYTORIN 10-20 MG TABS (EZETIMIBE-SIMVASTATIN) one by mouth daily ALPRAZOLAM 0.25 MG  TABS (ALPRAZOLAM) one by mouth q 4-6 hours prn Current Allergies: PCN   Influenza Vaccine    Vaccine Type: Fluvax MCR    Given by: Willy Eddy, LPN  Flu Vaccine Consent Questions    Do you have a history of severe allergic reactions to this vaccine? no    Any prior history of allergic reactions to egg and/or gelatin? no    Do you have a sensitivity to the preservative Thimersol? no    Do you have a past history of Guillan-Barre Syndrome? no    Do you currently have an acute febrile illness? no    Have you ever had a severe reaction to latex? no    Vaccine information given and explained to patient? yes   Orders Added: 1)  Influenza Vaccine MCR [00025]    ]

## 2010-04-24 NOTE — Consult Note (Signed)
Summary: [facility]  [facility]   Imported By: Maryln Gottron 01/04/2010 13:50:26  _____________________________________________________________________  External Attachment:    Type:   Image     Comment:   External Document

## 2010-04-24 NOTE — Consult Note (Signed)
Summary: [facility]   Imported ByLysle Rubens 01/24/2010 11:34:14  _____________________________________________________________________  External Attachment:    Type:   Image     Comment:   External Document

## 2010-04-24 NOTE — Assessment & Plan Note (Signed)
Summary: 2 month roa/jls   Vital Signs:  Patient Profile:   75 Years Old Male Height:     69 inches Weight:      190 pounds Temp:     98.1 degrees F oral Pulse rate:   14 / minute Resp:     72 per minute BP sitting:   134 / 80  (left arm)  Vitals Entered By: Willy Eddy, LPN (June 18, 2007 9:49 AM)                 Chief Complaint:  roa.  History of Present Illness: Current Problems:  HYPOGONADISM, MALE (ICD-257.2)  mood is down.... DEPRESSION, MILD (ICD-311) working with [place] to maintain the zoloft and alprazolam  LOW BACK PAIN (ICD-724.2) improved OSTEOARTHRITIS (ICD-715.90)   improving with exercize BENIGN PROSTATIC HYPERTROPHY, WITH OBSTRUCTION (ICD-600.01) HYPERGLYCEMIA, BORDERLINE (ICD-790.29) INTERNAL HEMORRHOIDS (ICD-455.0) HYPOTHYROIDISM (ICD-244.9) OVERACTIVE BLADDER (ICD-596.51) HYPERLIPIDEMIA (ICD-272.4)   review labs    Depression History:      The patient denies a depressed mood most of the day and a diminished interest in his usual daily activities.           Current Allergies: PCN  Past Medical History:    Reviewed history from 02/10/2007 and no changes required:       Hypothyroidism       hyperlipidemia       hemorrhiods       overactive bladder       Diabetes mellitus, type II       Osteoarthritis       Low back pain  Past Surgical History:    Reviewed history from 02/10/2007 and no changes required:       index finger right hand       Tonsillectomy       FX GREAT TOE   Family History:    Reviewed history from 10/14/2006 and no changes required:       father died from CVA at 50       Mother       Family History Lung cancer  Social History:    Reviewed history from 10/14/2006 and no changes required:       Retired       Married       Former Smoker quit for 40 years       Alcohol use-yes       Drug use-no       Regular exercise-yes    Review of Systems  The patient denies anorexia, fever, weight loss, weight gain,  vision loss, decreased hearing, hoarseness, chest pain, syncope, dyspnea on exhertion, peripheral edema, prolonged cough, hemoptysis, abdominal pain, melena, hematochezia, severe indigestion/heartburn, hematuria, incontinence, genital sores, muscle weakness, suspicious skin lesions, transient blindness, difficulty walking, depression, unusual weight change, abnormal bleeding, enlarged lymph nodes, angioedema, and breast masses.     Physical Exam  General:     Well-developed,well-nourished,in no acute distress; alert,appropriate and cooperative throughout examination Head:     Normocephalic and atraumatic without obvious abnormalities. No apparent alopecia or balding. Eyes:     No corneal or conjunctival inflammation noted. EOMI. Perrla. Funduscopic exam benign, without hemorrhages, exudates or papilledema. Vision grossly normal. Nose:     no nasal discharge.   Mouth:     Oral mucosa and oropharynx without lesions or exudates.  Teeth in good repair. Neck:     No deformities, masses, or tenderness noted. Lungs:     Normal respiratory effort, chest expands symmetrically. Lungs  are clear to auscultation, no crackles or wheezes. Heart:     Normal rate and regular rhythm. S1 and S2 normal without gallop, murmur, click, rub or other extra sounds. Abdomen:     Bowel sounds positive,abdomen soft and non-tender without masses, organomegaly or hernias noted. Prostate:     no gland enlargement.   Msk:     No deformity or scoliosis noted of thoracic or lumbar spine.   Pulses:     R and L carotid,radial,femoral,dorsalis pedis and posterior tibial pulses are full and equal bilaterally Neurologic:     No cranial nerve deficits noted. Station and gait are normal. Plantar reflexes are down-going bilaterally. DTRs are symmetrical throughout. Sensory, motor and coordinative functions appear intact.    Impression & Recommendations:  Problem # 1:  OSTEOARTHRITIS (ICD-715.90) Discussed use of  medications, application of heat or cold, and exercises.   Problem # 2:  HYPERGLYCEMIA, BORDERLINE (ICD-790.29)  Labs Reviewed: HgBA1c: 5.5 (10/07/2006)   Creat: 0.8 (02/03/2007)      Problem # 3:  HYPOTHYROIDISM (ICD-244.9) Discussed use of medications, application of heat or cold, and exercises.  add caclium with vitaimin d His updated medication list for this problem includes:    Synthroid 25 Mcg Tabs (Levothyroxine sodium) .Marland Kitchen... 1 tablet by mouth once a day  Labs Reviewed: TSH: 4.23 (02/03/2007)    HgBA1c: 5.5 (10/07/2006) Chol: 108 (02/03/2007)   HDL: 26.4 (02/03/2007)   LDL: 65 (02/03/2007)   TG: 85 (02/03/2007)  Orders: TLB-TSH (Thyroid Stimulating Hormone) (84443-TSH)   Problem # 4:  HYPERLIPIDEMIA (ICD-272.4)  His updated medication list for this problem includes:    Vytorin 10-20 Mg Tabs (Ezetimibe-simvastatin) ..... One by mouth daily  Labs Reviewed: Chol: 108 (02/03/2007)   HDL: 26.4 (02/03/2007)   LDL: 65 (02/03/2007)   TG: 85 (02/03/2007) SGOT: 34 (02/03/2007)   SGPT: 42 (02/03/2007)  Lipid Goals: Chol Goal: 200 (04/23/2007)   HDL Goal: 40 (04/23/2007)   LDL Goal: 130 (04/23/2007)   TG Goal: 150 (04/23/2007)  Prior 10 Yr Risk Heart Disease: 9 % (04/23/2007)   Problem # 5:  DEPRESSION, MILD (ICD-311)  His updated medication list for this problem includes:    Alprazolam 0.25 Mg Tabs (Alprazolam) .Marland Kitchen... 1/2  by mouth q 4-6 hours as needed    Zoloft 50 Mg Tab (Sertraline hcl) .Marland Kitchen... Take 1/2  tablet by mouth daily Discussed treatment options, including trial of antidpressant medication. Will refer to behavioral health. Follow-up call in in 24-48 hours and recheck in 2 weeks, sooner as needed. Patient agrees to call if any worsening of symptoms or thoughts of doing harm arise. Verified that the patient has no suicidal ideation at this time.   Medications Added to Medication List This Visit: 1)  Alprazolam 0.25 Mg Tabs (Alprazolam) .... 1/2  by mouth q 4-6 hours as  needed 2)  Zoloft 50 Mg Tab (Sertraline hcl) .... Take 1/2  tablet by mouth daily 3)  Calcium 500/d 500-200 Mg-unit Tabs (Calcium carbonate-vitamin d) .... One by mouth daily  Complete Medication List: 1)  Enablex 15 Mg Tb24 (Darifenacin hydrobromide) .... Take 1 tablet by mouth once a day 2)  Folic Acid 1 Mg Tabs (Folic acid) .... One by mouth daily 3)  Proscar 5 Mg Tabs (Finasteride) .... Take 1 tablet by mouth once a day 4)  Synthroid 25 Mcg Tabs (Levothyroxine sodium) .Marland Kitchen.. 1 tablet by mouth once a day 5)  Vytorin 10-20 Mg Tabs (Ezetimibe-simvastatin) .... One by mouth daily 6)  Alprazolam  0.25 Mg Tabs (Alprazolam) .... 1/2  by mouth q 4-6 hours as needed 7)  Zoloft 50 Mg Tab (Sertraline hcl) .... Take 1/2  tablet by mouth daily 8)  Calcium 500/d 500-200 Mg-unit Tabs (Calcium carbonate-vitamin d) .... One by mouth daily   Patient Instructions: 1)  Please schedule a follow-up appointment in 3 months. 2)  Hepatic Panel prior to visit, ICD-9: 995.20 3)  Lipid Panel prior to visit, ICD-9:272.0 4)  BMP prior to visit, ICD-9:  277.7    ]  Appended Document: 2 month roa/jls documentation error- pulse should be 72 and respirations are 14

## 2010-04-24 NOTE — Progress Notes (Signed)
Summary: referrals  Phone Note Call from Patient Call back at Home Phone [phone]   Summary of Call: Referral audiology for hearing test.  Referral to hand specialist for left hand small finger, opposite thumb, feels like it's broken and Dr. Shela Commons gave him cortisone shot in it.  Very painful, he bumped it into something & saw stars.  did see Dr. Charlett Blake before about other joint problems.  Hand specialist this time, he thinks.  Your choice ok both referrals.  Initial call taken by: Rudy Jew, RN,  December 18, 2009 4:29 PM  New Problems: HAND PAIN, LEFT (ICD-729.5) HEARING LOSS (ICD-389.9)   New Problems: HAND PAIN, LEFT (ICD-729.5) HEARING LOSS (ICD-389.9)

## 2010-04-24 NOTE — Progress Notes (Signed)
Summary: sent pt to ED  Phone Note Call from Patient Call back at Home Phone [phone]   Summary of Call: Patient was at the gym this morning he was working out on elliptical machine.  His heart rate was 115-120 then all of a sudden his heart rate started dropping.  He started feeling lightheaded. He has not checked his BP.   He sat down ate some breakfast & drank gatorade.  He is still very tired.  Has diarrhea & a "little chest pressure".  Advised pt that he needs to be evaluated in ED [facility]  September 24, 2008 12:37 PM

## 2010-04-24 NOTE — Progress Notes (Signed)
Summary: Referral  to ENT  Phone Note Call from Patient Call back at Home Phone [phone]   Caller: Patient Call For: [facility] Summary of Call: Patient is calling about an appointment to an ENT. Gabriel Cirri has no record of referral. Initial call taken by: Florentina Addison,  August 25, 2007 10:36 AM  Follow-up for Phone Call        Reviewed and forwarded to Dr Kathrene Bongo LPN  August 25, 5619 10:54 AM   Additional Follow-up for Phone Call Additional follow up Details #1::        Needs ENT referral:  nasal fracture- try to get appt on 6-8 at which time he will need suture removal Additional Follow-up by: Gordy Savers  MD,  August 25, 2007 12:34 PM         Appended Document: Referral  to ENT 06/08 @ 2:30 with Dr. Ezzard Standing [address] [facility] [phone]  Patient aware

## 2010-04-24 NOTE — Assessment & Plan Note (Signed)
Summary: 3 MONTH ROV/NJR   Vital Signs:  Patient Profile:   75 Years Old Male Height:     69 inches Weight:      188 pounds Temp:     98.5 degrees F oral Pulse rate:   72 / minute Resp:     14 per minute BP sitting:   120 / 64  (left arm)  Vitals Entered By: Willy Eddy, LPN (February 23, 2008 8:23 AM)                 Chief Complaint:  roa labs.  History of Present Illness: Follow up of chronic medical probles listed below Current Problems:  HYPOGONADISM, MALE (ICD-257.2) DEPRESSION, MILD (ICD-311) LOW BACK PAIN (ICD-724.2) OSTEOARTHRITIS (ICD-715.90) BENIGN PROSTATIC HYPERTROPHY, WITH OBSTRUCTION (ICD-600.01) HYPERGLYCEMIA, BORDERLINE (ICD-790.29) INTERNAL HEMORRHOIDS (ICD-455.0) HYPOTHYROIDISM (ICD-244.9) OVERACTIVE BLADDER (ICD-596.51) HYPERLIPIDEMIA (ICD-272.4)    Follow-Up Visit      This is a 75 year old man who presents for Follow-up visit.  The patient denies chest pain, palpitations, dizziness, syncope, low blood sugar symptoms, high blood sugar symptoms, edema, SOB, DOE, PND, and orthopnea.  Since the last visit the patient notes no new problems or concerns.  The patient reports taking meds as prescribed and monitoring BP.  When questioned about possible medication side effects, the patient notes none.      Current Allergies: PCN  Past Medical History:    Reviewed history from 02/10/2007 and no changes required:       Hypothyroidism       hyperlipidemia       hemorrhiods       overactive bladder       Diabetes mellitus, type II       Osteoarthritis       Low back pain  Past Surgical History:    Reviewed history from 02/10/2007 and no changes required:       index finger right hand       Tonsillectomy       FX GREAT TOE   Family History:    Reviewed history from 10/14/2006 and no changes required:       father died from CVA at 21       Mother       Family History Lung cancer  Social History:    Reviewed history from 10/14/2006 and  no changes required:       Retired       Married       Former Smoker quit for 40 years       Alcohol use-yes       Drug use-no       Regular exercise-yes    Review of Systems  The patient denies anorexia, fever, weight loss, weight gain, vision loss, decreased hearing, hoarseness, chest pain, syncope, dyspnea on exertion, peripheral edema, prolonged cough, headaches, hemoptysis, abdominal pain, melena, hematochezia, severe indigestion/heartburn, hematuria, incontinence, genital sores, muscle weakness, suspicious skin lesions, transient blindness, difficulty walking, depression, unusual weight change, abnormal bleeding, enlarged lymph nodes, angioedema, breast masses, and testicular masses.     Physical Exam  General:     Well-developed,well-nourished,in no acute distress; alert,appropriate and cooperative throughout examination Head:     Normocephalic and atraumatic without obvious abnormalities. No apparent alopecia or balding. Ears:     External ear exam shows no significant lesions or deformities.  Otoscopic examination reveals clear canals, tympanic membranes are intact bilaterally without bulging, retraction, inflammation or discharge. Hearing is grossly normal bilaterally. Mouth:  posterior lymphoid hypertrophy and postnasal drip.   Neck:     No deformities, masses, or tenderness noted. Lungs:     Normal respiratory effort, chest expands symmetrically. Lungs are clear to auscultation, no crackles or wheezes. Heart:     Normal rate and regular rhythm. S1 and S2 normal without gallop, murmur, click, rub or other extra sounds. Abdomen:     Bowel sounds positive,abdomen soft and non-tender without masses, organomegaly or hernias noted. Extremities:     No clubbing, cyanosis, edema, or deformity noted with normal full range of motion of all joints.   Neurologic:     No cranial nerve deficits noted. Station and gait are normal. Plantar reflexes are down-going bilaterally. DTRs  are symmetrical throughout. Sensory, motor and coordinative functions appear intact.    Impression & Recommendations:  Problem # 1:  LOW BACK PAIN (ICD-724.2)  His updated medication list for this problem includes:    Ascriptin 325 Mg Tabs (Aspirin buf(alhyd-mghyd-cacar)) Discussed use of moist heat or ice, modified activities, medications, and stretching/strengthening exercises. Back care instructions given. To be seen in 2 weeks if no improvement; sooner if worsening of symptoms.   Problem # 2:  HYPOTHYROIDISM (ICD-244.9)  His updated medication list for this problem includes:    Synthroid 25 Mcg Tabs (Levothyroxine sodium) .Marland Kitchen... 1 tablet by mouth once a day  Labs Reviewed: TSH: 3.44 (06/18/2007)    HgBA1c: 5.5 (10/07/2006) Chol: 86 (11/12/2007)   HDL: 23.7 (11/12/2007)   LDL: 47 (11/12/2007)   TG: 79 (11/12/2007)   Problem # 3:  HYPERLIPIDEMIA (ICD-272.4) the new liver functions today are normal His updated medication list for this problem includes:    Simvastatin 20 Mg Tabs (Simvastatin) .Marland Kitchen... Take 1 tablet by mouth each evening  Labs Reviewed: Chol: 86 (11/12/2007)   HDL: 23.7 (11/12/2007)   LDL: 47 (11/12/2007)   TG: 79 (11/12/2007) SGOT: 41 (11/12/2007)   SGPT: 59 (11/12/2007)  Lipid Goals: Chol Goal: 200 (04/23/2007)   HDL Goal: 40 (04/23/2007)   LDL Goal: 130 (04/23/2007)   TG Goal: 150 (04/23/2007)  Prior 10 Yr Risk Heart Disease: 9 % (04/23/2007)   Complete Medication List: 1)  Folic Acid 1 Mg Tabs (Folic acid) .... One by mouth daily 2)  Proscar 5 Mg Tabs (Finasteride) .... Take 1 tablet by mouth once a day 3)  Synthroid 25 Mcg Tabs (Levothyroxine sodium) .Marland Kitchen.. 1 tablet by mouth once a day 4)  Sertraline Hcl 50 Mg Tabs (Sertraline hcl) .Marland Kitchen.. 1 once daily 5)  Calcium 500/d 500-200 Mg-unit Tabs (Calcium carbonate-vitamin d) .... One by mouth daily 6)  B-50 Complex Tabs (B complex-folic acid) .Marland Kitchen.. 1 once daily 7)  Analpram-hc 1-2.5 % Crea (Hydrocortisone  ace-pramoxine) .... Use as directed for hemorrhoidal flare up 8)  Simvastatin 20 Mg Tabs (Simvastatin) .... Take 1 tablet by mouth each evening 9)  Diazepam 5 Mg Tabs (Diazepam) .... 1/2 to one by mouth q hs 10)  Prascion 10-5 % Emul (Sulfacetamide sodium-sulfur) .... Use as directed 11)  Desonide 0.05 % Crea (Desonide) .... Use as directed 12)  Ascriptin 325 Mg Tabs (Aspirin buf(alhyd-mghyd-cacar))   Patient Instructions: 1)  Please schedule a follow-up appointment in 6 months.   CPX with labs prior   Prescriptions: SERTRALINE HCL 50 MG TABS (SERTRALINE HCL) 1 once daily  #90 x 3   Entered by:   Willy Eddy, LPN   Authorized by:   Stacie Glaze MD   Signed by:   Willy Eddy,  LPN on 29/56/2130   Method used:   Print then Give to Patient   RxID:   [id number] SYNTHROID 25 MCG TABS (LEVOTHYROXINE SODIUM) 1 tablet by mouth once a day  #90 x 3   Entered by:   Willy Eddy, LPN   Authorized by:   Stacie Glaze MD   Signed by:   Willy Eddy, LPN on 32/44/0102   Method used:   Print then Give to Patient   RxID:   [id number] PROSCAR 5 MG TABS (FINASTERIDE) Take 1 tablet by mouth once a day  #90 x 3   Entered by:   Willy Eddy, LPN   Authorized by:   Stacie Glaze MD   Signed by:   Willy Eddy, LPN on 56/38/7564   Method used:   Print then Give to Patient   RxID:   [id number] FOLIC ACID 1 MG TABS (FOLIC ACID) one by mouth daily  #90 x 3   Entered by:   Willy Eddy, LPN   Authorized by:   Stacie Glaze MD   Signed by:   Willy Eddy, LPN on 16/03/930   Method used:   Print then Give to Patient   RxID:   [id number]  ]

## 2010-04-24 NOTE — Assessment & Plan Note (Signed)
Summary: follow up from er stitches/mhf   Vital Signs:  Patient Profile:   75 Years Old Male Height:     69 inches Weight:      191 pounds Temp:     98.2 degrees F oral BP sitting:   120 / 70  (left arm) Cuff size:   regular  Vitals Entered By: Sid Falcon LPN (August 23, 1608 3:08 PM)                 Chief Complaint:  Larey Seat Sat, 5/30, 4am hit head on a beside table, and requesting referrral to ENT for eval of nose fx and suture removal.  History of Present Illness: 75 year old gentleman was evaluated and treated in the ED early Saturday morning after a fall, resulting in facial trauma.  A CT of the head was negative for serious pathology, but he does have a nondisplaced nasal fracture.  17.  Sutures were placed about the nose and for head related to the trauma.  He states he fell approximately 6 feet away from his bed.  It was unclear whether he was responding to a dream or exactly what happened.  He has had a single episode of prior sleepwalking when he was a teenager.    Current Allergies: PCN      Physical Exam  General:     Well-developed,well-nourished,in no acute distress; alert,appropriate and cooperative throughout examination Skin:     nicely healing incision involving  the nose, and for- head region.  Peri-orbital and nasal ecchymoses noted.  There is no septal deviation and minimal septal swelling    Impression & Recommendations:  Problem # 1:  NOSE FRACTURE (ICD-802.0)  Problem # 2:  HYPERGLYCEMIA, BORDERLINE (ICD-790.29)  Complete Medication List: 1)  Enablex 15 Mg Tb24 (Darifenacin hydrobromide) .... Take 1 tablet by mouth once a day 2)  Folic Acid 1 Mg Tabs (Folic acid) .... One by mouth daily 3)  Proscar 5 Mg Tabs (Finasteride) .... Take 1 tablet by mouth once a day 4)  Synthroid 25 Mcg Tabs (Levothyroxine sodium) .Marland Kitchen.. 1 tablet by mouth once a day 5)  Vytorin 10-20 Mg Tabs (Ezetimibe-simvastatin) .... One by mouth daily 6)  Alprazolam 0.25 Mg Tabs  (Alprazolam) .... 1/2  by mouth q 4-6 hours as needed 7)  Zoloft 50 Mg Tab (Sertraline hcl) .... Take 1/2  tablet by mouth daily 8)  Calcium 500/d 500-200 Mg-unit Tabs (Calcium carbonate-vitamin d) .... One by mouth daily 9)  Azithromycin 250 Mg Tabs (Azithromycin) .... Two tabs first day, then one tab daily   Patient Instructions: 1)  keep the nasal laceration clean and dry 2)  suture removal in 7 days 3)  ENT referral as scheduled   Prescriptions: AZITHROMYCIN 250 MG  TABS (AZITHROMYCIN) Two tabs first day, then one tab daily  #6 x 0   Entered by:   Sid Falcon LPN   Authorized by:   Gordy Savers  MD   Signed by:   Gordy Savers  MD on 08/24/2007   Method used:   Historical   RxID:   [id number]  ]

## 2010-04-24 NOTE — Consult Note (Signed)
Summary: Dr Ezzard Standing note  Dr Ezzard Standing note   Imported By: Kassie Mends 09/17/2007 09:21:56  _____________________________________________________________________  External Attachment:    Type:   Image     Comment:   Dr Ezzard Standing note

## 2010-04-26 NOTE — Assessment & Plan Note (Signed)
Summary: 2 month rov/njr   Vital Signs:  Patient profile:   75 year old male Height:      69 inches Weight:      191 pounds BMI:     28.31 Temp:     98.2 degrees F oral Pulse rate:   72 / minute Resp:     14 per minute BP sitting:   136 / 80  (left arm)  Vitals Entered By: Willy Eddy, LPN (April 03, 2010 10:47 AM) CC: roa- c/o cold like sx with cough Is Patient Diabetic? No   Primary Care Provider:  Stacie Glaze MD  CC:  roa- c/o cold like sx with cough.  History of Present Illness: Has URI for about  a week, nasal conjection cough, PNdrip. Has been taking tylenol cold. He is on aleve for the post surgicla hand swelling The pt has neck pain and  was referred to Ortho who recommned PT but has not complied due to wifes issues. The pt had carple tunnel release NOV Follow up trial of plaquinil for arthritis ( working diagnosis seronegative) no imperic improvement. Weight increased noted  Preventive Screening-Counseling & Management  Alcohol-Tobacco     Alcohol drinks/day: 1     Smoking Status: quit     Year Quit: 1970     Pack years: 2     Passive Smoke Exposure: no     Tobacco Counseling: to remain off tobacco products  Problems Prior to Update: 1)  ? of Ankylosing Spondylitis  (ICD-720.0) 2)  Carpal Tunnel Syndrome, Bilateral  (ICD-354.0) 3)  Hand Pain, Left  (ICD-729.5) 4)  Hearing Loss  (ICD-389.9) 5)  Metabolic Syndrome X  (ICD-277.7) 6)  Hypoglycemia, Unspecified  (ICD-251.2) 7)  Palpitations, Chronic  (ICD-785.1) 8)  Allergic Rhinitis  (ICD-477.9) 9)  Sinusitis- Acute-nos  (ICD-461.9) 10)  Upper Respiratory Infection, Acute, With Bronchitis  (ICD-465.9) 11)  Uns Advrs Eff Uns Rx Medicinal&biological Sbstnc  (ICD-995.20) 12)  Nose Fracture  (ICD-802.0) 13)  Hypogonadism, Male  (ICD-257.2) 14)  Depression, Mild  (ICD-311) 15)  Low Back Pain  (ICD-724.2) 16)  Osteoarthritis  (ICD-715.90) 17)  Benign Prostatic Hypertrophy, With Obstruction   (ICD-600.01) 18)  Hyperglycemia, Borderline  (ICD-790.29) 19)  Examination, Routine Medical  (ICD-V70.0) 20)  Internal Hemorrhoids  (ICD-455.0) 21)  Hypothyroidism  (ICD-244.9) 22)  Overactive Bladder  (ICD-596.51) 23)  Hyperlipidemia  (ICD-272.4)  Current Problems (verified): 1)  ? of Ankylosing Spondylitis  (ICD-720.0) 2)  Carpal Tunnel Syndrome, Bilateral  (ICD-354.0) 3)  Hand Pain, Left  (ICD-729.5) 4)  Hearing Loss  (ICD-389.9) 5)  Metabolic Syndrome X  (ICD-277.7) 6)  Hypoglycemia, Unspecified  (ICD-251.2) 7)  Palpitations, Chronic  (ICD-785.1) 8)  Allergic Rhinitis  (ICD-477.9) 9)  Sinusitis- Acute-nos  (ICD-461.9) 10)  Upper Respiratory Infection, Acute, With Bronchitis  (ICD-465.9) 11)  Uns Advrs Eff Uns Rx Medicinal&biological Sbstnc  (ICD-995.20) 12)  Nose Fracture  (ICD-802.0) 13)  Hypogonadism, Male  (ICD-257.2) 14)  Depression, Mild  (ICD-311) 15)  Low Back Pain  (ICD-724.2) 16)  Osteoarthritis  (ICD-715.90) 17)  Benign Prostatic Hypertrophy, With Obstruction  (ICD-600.01) 18)  Hyperglycemia, Borderline  (ICD-790.29) 19)  Examination, Routine Medical  (ICD-V70.0) 20)  Internal Hemorrhoids  (ICD-455.0) 21)  Hypothyroidism  (ICD-244.9) 22)  Overactive Bladder  (ICD-596.51) 23)  Hyperlipidemia  (ICD-272.4)  Medications Prior to Update: 1)  Folic Acid 1 Mg Tabs (Folic Acid) .... One By Mouth Daily 2)  Proscar 5 Mg Tabs (Finasteride) .... Take 1 Tablet By  Mouth Once A Day 3)  Synthroid 50 Mcg Tabs (Levothyroxine Sodium) .... One By Mouth Daily 4)  B-50 Complex  Tabs (B Complex-Folic Acid) .Marland Kitchen.. 1 Once Daily 5)  Analpram-Hc 1-2.5 % Crea (Hydrocortisone Ace-Pramoxine) .... Use As Directed For Hemorrhoidal Flare Up 6)  Simvastatin 20 Mg Tabs (Simvastatin) .... Take 1 Tablet By Mouth Each Evening 7)  Prascion 10-5 % Emul (Sulfacetamide Sodium-Sulfur) .... Use As Directed 8)  Desonide 0.05 % Crea (Desonide) .... Use As Directed 9)  Aspirin 81 Mg Tabs (Aspirin) .Marland Kitchen.. 1 Once  Daily 10)  Fluticasone Propionate 50 Mcg/act Susp (Fluticasone Propionate) .... 2 Sprays in Each Nostril Daily For Allergy Symptoms 11)  Androgel Pump 1 % Gel (Testosterone) .... 3 Pumps Once Daily 12)  Voltaren 1 % Gel (Diclofenac Sodium) .... As Directed 13)  Plaquenil 200 Mg Tabs (Hydroxychloroquine Sulfate) .... One By Mouth Two Times A Day  Current Medications (verified): 1)  Folic Acid 1 Mg Tabs (Folic Acid) .... One By Mouth Daily 2)  Proscar 5 Mg Tabs (Finasteride) .... Take 1 Tablet By Mouth Once A Day 3)  Synthroid 50 Mcg Tabs (Levothyroxine Sodium) .... One By Mouth Daily 4)  B-50 Complex  Tabs (B Complex-Folic Acid) .Marland Kitchen.. 1 Once Daily 5)  Analpram-Hc 1-2.5 % Crea (Hydrocortisone Ace-Pramoxine) .... Use As Directed For Hemorrhoidal Flare Up 6)  Simvastatin 20 Mg Tabs (Simvastatin) .... Take 1 Tablet By Mouth Each Evening 7)  Prascion 10-5 % Emul (Sulfacetamide Sodium-Sulfur) .... Use As Directed 8)  Aspirin 81 Mg Tabs (Aspirin) .Marland Kitchen.. 1 Once Daily 9)  Fluticasone Propionate 50 Mcg/act Susp (Fluticasone Propionate) .... 2 Sprays in Each Nostril Daily For Allergy Symptoms 10)  Androgel Pump 1.25 Gm/act (1%) Gel (Testosterone) .... Two Pums To Skin Daily 11)  Aleve 220 Mg Tabs (Naproxen Sodium) .Marland Kitchen.. 1 Two Times A Day  Allergies (verified): 1)  Pcn  Past History:  Family History: Last updated: October 21, 2006 father died from CVA at 76 Mother Family History Lung cancer  Social History: Last updated: 21-Oct-2006 Retired Married Former Smoker quit for 40 years Alcohol use-yes Drug use-no Regular exercise-yes  Risk Factors: Alcohol Use: 1 (04/03/2010) Caffeine Use: 1-2 cupps a day (2009/10/20) Exercise: yes (21-Oct-2006)  Risk Factors: Smoking Status: quit (04/03/2010) Passive Smoke Exposure: no (04/03/2010)  Past medical, surgical, family and social histories (including risk factors) reviewed, and no changes noted (except as noted below).  Past Medical History: Reviewed  history from 03/26/2008 and no changes required. Hypothyroidism hyperlipidemia hemorrhiods overactive bladder Diabetes mellitus, type II Osteoarthritis Low back pain Allergic rhinitis  Past Surgical History: index finger right hand Tonsillectomy FX GREAT TOE  Carpal tunnel release left  Family History: Reviewed history from 2006/10/21 and no changes required. father died from CVA at 75 Mother Family History Lung cancer  Social History: Reviewed history from Oct 21, 2006 and no changes required. Retired Married Former Smoker quit for 40 years Alcohol use-yes Drug use-no Regular exercise-yes  Review of Systems  The patient denies anorexia, fever, weight loss, weight gain, vision loss, decreased hearing, hoarseness, chest pain, syncope, dyspnea on exertion, peripheral edema, prolonged cough, headaches, hemoptysis, abdominal pain, melena, hematochezia, severe indigestion/heartburn, hematuria, incontinence, genital sores, muscle weakness, suspicious skin lesions, transient blindness, difficulty walking, depression, unusual weight change, abnormal bleeding, enlarged lymph nodes, angioedema, and breast masses.    Physical Exam  General:  alert.  NADwell-developed and well-hydrated.   Head:  atraumatic and male-pattern balding.   Eyes:  pupils equal and pupils round.   Ears:  R ear normal and L ear normal.   Neck:  supple and no cervical lymphadenopathy.   Lungs:  normal respiratory effort, normal breath sounds, no crackles, and no wheezes.   Heart:  Normal rate and regular rhythm. S1 and S2 normal without gallop, murmur, click, rub or other extra sounds. Abdomen:  Bowel sounds positive,abdomen soft and non-tender without masses, organomegaly or hernias noted. Msk:  lumbar lordosis and SI joint tenderness.   Extremities:  No clubbing, cyanosis, edema, or deformity noted with normal full range of motion of all joints.   Neurologic:  No cranial nerve deficits noted. Station and  gait are normal. Plantar reflexes are down-going bilaterally. DTRs are symmetrical throughout. Sensory, motor and coordinative functions appear intact.   Impression & Recommendations:  Problem # 1:  ? of ANKYLOSING SPONDYLITIS (ICD-720.0) Assessment Unchanged stop  plaqinil and moniter... the diagnosis of ankylosing spondylitis is now less likely  and the probability that his arthritic pain is osteoarthritis is high  Problem # 2:  METABOLIC SYNDROME X (ICD-277.7) Assessment: Deteriorated weigth loss is key  to this condition the patient is at high risk for developing adult-onset diabetes  Problem # 3:  LOW BACK PAIN (ICD-724.2)  persistent low back pain in which the differential diagnosis should included  rheumatic disease as well as osteoarthritis he is now felt to be more likely osteoarthritis. His updated medication list for this problem includes:    Aspirin 81 Mg Tabs (Aspirin) .Marland Kitchen... 1 once daily    Aleve 220 Mg Tabs (Naproxen sodium) .Marland Kitchen... 1 two times a day  Discussed use of moist heat or ice, modified activities, medications, and stretching/strengthening exercises. Back care instructions given. To be seen in 2 weeks if no improvement; sooner if worsening of symptoms.   Problem # 4:  HYPOTHYROIDISM (ICD-244.9)  stable on current dosage His updated medication list for this problem includes:    Synthroid 50 Mcg Tabs (Levothyroxine sodium) ..... One by mouth daily  Labs Reviewed: TSH: 2.66 (01/02/2010)    HgBA1c: 5.6 (02/16/2008) Chol: 99 (09/26/2009)   HDL: 25.40 (09/26/2009)   LDL: 51 (09/26/2009)   TG: 113.0 (09/26/2009)  Problem # 5:  CARPAL TUNNEL SYNDROME, BILATERAL (ICD-354.0)  persistent numbness in hands and upper extremity are more likely to represent carpal tunnel syndrome and cervical radiculopathy.  Complete Medication List: 1)  Folic Acid 1 Mg Tabs (Folic acid) .... One by mouth daily 2)  Proscar 5 Mg Tabs (Finasteride) .... Take 1 tablet by mouth once a day 3)   Synthroid 50 Mcg Tabs (Levothyroxine sodium) .... One by mouth daily 4)  B-50 Complex Tabs (B complex-folic acid) .Marland Kitchen.. 1 once daily 5)  Analpram-hc 1-2.5 % Crea (Hydrocortisone ace-pramoxine) .... Use as directed for hemorrhoidal flare up 6)  Simvastatin 20 Mg Tabs (Simvastatin) .... Take 1 tablet by mouth each evening 7)  Prascion 10-5 % Emul (Sulfacetamide sodium-sulfur) .... Use as directed 8)  Aspirin 81 Mg Tabs (Aspirin) .Marland Kitchen.. 1 once daily 9)  Fluticasone Propionate 50 Mcg/act Susp (Fluticasone propionate) .... 2 sprays in each nostril daily for allergy symptoms 10)  Androgel Pump 1.25 Gm/act (1%) Gel (Testosterone) .... Two pums to skin daily 11)  Aleve 220 Mg Tabs (Naproxen sodium) .Marland Kitchen.. 1 two times a day  Patient Instructions: 1)  stop the plaquinil and moniter if the  neck and back show not progresion of pain or stiffness stay off if there is a progress of stiffness of pain resume and call 2)  Please schedule a  follow-up appointment in 3 months. Prescriptions: ANDROGEL PUMP 1.25 GM/ACT (1%) GEL (TESTOSTERONE) two pums to skin daily  #2 units x 11   Entered and Authorized by:   Stacie Glaze MD   Signed by:   Stacie Glaze MD on 04/03/2010   Method used:   Print then Give to Patient   RxID:   [id number]    Orders Added: 1)  Est. Patient Level IV [14782]

## 2010-04-26 NOTE — Consult Note (Signed)
Summary: [facility]  [facility]   Imported By: Maryln Gottron 03/06/2010 09:36:09  _____________________________________________________________________  External Attachment:    Type:   Image     Comment:   External Document

## 2010-05-14 NOTE — Op Note (Signed)
NAME:  Stephen Robbins, Stephen Robbins                 ACCOUNT NO.:  [id number]  MEDICAL RECORD NO.:  [id number]          PATIENT TYPE:  AMB  LOCATION:  [facility]                          FACILITY:  [facility]  PHYSICIAN:  Cindee Salt, M.D.       DATE OF BIRTH:  07-24-1935  DATE OF PROCEDURE:  02/09/2010 DATE OF DISCHARGE:                              OPERATIVE REPORT   PREOPERATIVE DIAGNOSES:  Carpal tunnel syndrome, compression left hand, Guyon canal compression ulnar nerve, left wrist; cubital tunnel, left elbow.  POSTOPERATIVE DIAGNOSES:  Carpal tunnel syndrome, compression left hand, Guyon canal compression ulnar nerve, left wrist; cubital tunnel, left elbow.  OPERATION:  Decompression median and ulnar nerve at the wrist with decompression ulnar nerve at the elbow, left arm.  SURGEON:  Cindee Salt, MD  ASSISTANT:  Joaquin Courts, RN  ANESTHESIA:  General.  ANESTHESIOLOGIST:  Sheldon Silvan, MD  HISTORY:  The patient is a 75 year old male with a history of carpal tunnel syndrome compression against canal and cubital tunnel syndrome, left arm.  EMG nerve conductions positive.  He has elected to undergo surgical decompression after failure of conservative treatment.  He is aware of risks and complications that there is no guarantee of the surgery, possibility of infection, recurrence injury to arteries, nerves, tendons, incomplete relief of symptoms, dystrophy.  In the preoperative area, the patient is seen, the extremity marked by both the patient and surgeon, antibiotic given.  PROCEDURE:  The patient was brought to the operating room where a general anesthetic was carried out without difficulty.  He was prepped using ChloraPrep, supine position with the left arm free.  A 3-minute dry time was allowed.  Time-out taken confirming the patient and procedure.  The limb was exsanguinated with an Esmarch bandage. Tourniquet placed high and the arm was inflated to 250 mmHg.  A longitudinal incision was made in  the palm, carried down through subcutaneous tissue.  Bleeders were electrocauterized.  Palmar fascia was split.  Superficial palmar arch identified.  At this point, tourniquet failed.  This was rewrapped, re-elevated to 300 mmHg.  The dissection carried down splitting the palmar fascia.  The carpal retinaculum was then incised after placement of retractors between the flexor tendons of the radial side and ulnar nerve on the ulnar side. The carpal retinaculum was incised, right angle and Sewall retractor were placed between skin and forearm fascia.  The fascia was released approximately 1.5 cm proximal to the wrist crease under direct vision. The Guyon canal ulnar nerve was then released over its entire aspect including the hypothenar musculature tracing the motor branch deep.  No further lesions were identified.  The nerves were both hyperemic.  The wound was irrigated and closed with a subcuticular 4-0 Vicryl Rapide suture.  The elbow was attended to next.  An incision made over the medial epicondyle of the elbow, carried down through the subcutaneous tissue.  Bleeders were electrocauterized with bipolar.  Dissection carried down to Osborne's fascia.  This was incised in its most posterior aspect.  This allowed visualization of the ulnar nerve.  Two knee retractors were placed proximally after dissection of  the subcutaneous tissue from the underlying fascia.  The fascia was then released after placement of KMI carpal tunnel guide.  This was released with a pair of straight nasal scissors.  This was done for approximately 6-7 cm proximally.  The nerve was then traced distally.  The flexor carpi ulnaris fascia was then split after retractors were placed.  The muscle belly was then split with blunt dissection.  The KMI retractor for the carpal tunnel was placed between the nerve and the deep fascia. The deep fascia was released again using the ENT scissors distally for again approximately 7  cm.  Both wounds were explored.  No further lesions were identified to palpation.  The elbow placed through full flexion.  No dislocation to the nerve was noted.  The wound was copiously irrigated with saline.  The Osborne's fascia anterior aspect was then sutured to the posterior skin flap with 2-0 Vicryl sutures. The skin was then closed with a subcuticular 4-0 Vicryl Rapide suture. A sterile compressive dressing long-arm splint with the elbow flexed approximately 30 degrees including the wrist with fingers free was applied.  Deflation of the tourniquet, all fingers immediately pinked, and he was taken to the recovery room for observation in a satisfactory condition.  He will be discharged home to return to the [facility] in 1 week on Vicodin.          ______________________________ Cindee Salt, M.D.     GK/MEDQ  D:  02/09/2010  T:  02/10/2010  Job:  [id number]  cc:   Cristi Loron, M.D.  Electronically Signed by Cindee Salt M.D. on 05/14/2010 12:25:24 PM

## 2010-06-05 LAB — POCT HEMOGLOBIN-HEMACUE: Hemoglobin: 17.7 g/dL — ABNORMAL HIGH (ref 13.0–17.0)

## 2010-06-29 ENCOUNTER — Other Ambulatory Visit: Payer: Self-pay | Admitting: Internal Medicine

## 2010-06-29 DIAGNOSIS — E349 Endocrine disorder, unspecified: Secondary | ICD-10-CM

## 2010-07-03 ENCOUNTER — Ambulatory Visit: Payer: Self-pay | Admitting: Internal Medicine

## 2010-07-30 ENCOUNTER — Ambulatory Visit: Payer: Self-pay | Admitting: Internal Medicine

## 2010-07-31 ENCOUNTER — Encounter: Payer: Self-pay | Admitting: Internal Medicine

## 2010-08-02 ENCOUNTER — Encounter: Payer: Self-pay | Admitting: Internal Medicine

## 2010-08-02 ENCOUNTER — Ambulatory Visit (INDEPENDENT_AMBULATORY_CARE_PROVIDER_SITE_OTHER): Payer: Medicare Other | Admitting: Internal Medicine

## 2010-08-02 VITALS — BP 140/80 | HR 72 | Temp 98.1°F | Resp 14 | Ht 69.0 in | Wt 191.0 lb

## 2010-08-02 DIAGNOSIS — F3289 Other specified depressive episodes: Secondary | ICD-10-CM

## 2010-08-02 DIAGNOSIS — E8881 Metabolic syndrome: Secondary | ICD-10-CM

## 2010-08-02 DIAGNOSIS — F329 Major depressive disorder, single episode, unspecified: Secondary | ICD-10-CM

## 2010-08-02 DIAGNOSIS — E291 Testicular hypofunction: Secondary | ICD-10-CM

## 2010-08-02 DIAGNOSIS — E785 Hyperlipidemia, unspecified: Secondary | ICD-10-CM

## 2010-08-02 DIAGNOSIS — J309 Allergic rhinitis, unspecified: Secondary | ICD-10-CM

## 2010-08-02 DIAGNOSIS — R7309 Other abnormal glucose: Secondary | ICD-10-CM

## 2010-08-02 DIAGNOSIS — E349 Endocrine disorder, unspecified: Secondary | ICD-10-CM

## 2010-08-02 LAB — HEMOGLOBIN A1C: Hgb A1c MFr Bld: 6.3 % (ref 4.6–6.5)

## 2010-08-02 LAB — TESTOSTERONE: Testosterone: 584.84 ng/dL (ref 350.00–890.00)

## 2010-08-02 MED ORDER — GLUCOSAMINE CHONDR 500 COMPLEX PO CAPS: 1.0000 | ORAL_CAPSULE | Freq: Two times a day (BID) | ORAL | Status: DC

## 2010-08-02 MED ORDER — FLUTICASONE FUROATE 27.5 MCG/SPRAY NA SUSP: 2.0000 | Freq: Every day | NASAL | Status: AC

## 2010-08-02 MED ORDER — TESTOSTERONE 20.25 MG/ACT (1.62%) TD GEL: 20.2500 mg | Freq: Every day | TRANSDERMAL | Status: DC

## 2010-08-02 NOTE — Progress Notes (Signed)
  Subjective:    Patient ID: Stephen Robbins, male    DOB: 1936-01-17, 75 y.o.   MRN: [id number]  HPI persistant night sweats No weight loss Using andorgel Hx of hypoglycemia    Review of Systems  Constitutional: Negative for fever and fatigue.       Night sweats  HENT: Negative for hearing loss, congestion, neck pain and postnasal drip.   Eyes: Negative for discharge, redness and visual disturbance.  Respiratory: Negative for cough, shortness of breath and wheezing.   Cardiovascular: Negative for leg swelling.  Gastrointestinal: Negative for abdominal pain, constipation and abdominal distention.  Genitourinary: Negative for urgency and frequency.  Musculoskeletal: Negative for joint swelling and arthralgias.  Skin: Negative for color change and rash.  Neurological: Negative for weakness and light-headedness.  Hematological: Negative for adenopathy.  Psychiatric/Behavioral: Negative for behavioral problems.       Past Medical History  Diagnosis Date  . Thyroid disease   . Hyperlipidemia   . Hemorrhoids   . Overactive bladder   . Diabetes mellitus   . Arthritis   . Low back pain   . Allergy    Past Surgical History  Procedure Date  . Index finger right thand   . Tonsillectomy   . Fx great toe   . Carpel tunnel release left     reports that he quit smoking about 40 years ago. He does not have any smokeless tobacco history on file. He reports that he drinks alcohol. He reports that he does not use illicit drugs. family history includes Lung cancer in an unspecified family member and Stroke in his father and unspecified family member. Allergies  Allergen Reactions  . Penicillins     REACTION: unspecified    Objective:   Physical Exam  Nursing note and vitals reviewed. Constitutional: He appears well-developed and well-nourished.  HENT:  Head: Normocephalic and atraumatic.  Eyes: Conjunctivae are normal. Pupils are equal, round, and reactive to light.  Neck: Normal  range of motion. Neck supple.  Cardiovascular: Normal rate and regular rhythm.   Pulmonary/Chest: Effort normal and breath sounds normal.  Abdominal: Soft. Bowel sounds are normal.          Assessment & Plan:  See problem focused

## 2010-08-02 NOTE — Assessment & Plan Note (Signed)
Application techniques Application to the underarm and arm rather that the belly

## 2010-08-02 NOTE — Assessment & Plan Note (Signed)
previewed the use of saline and nasal steroids for preventoon

## 2010-08-02 NOTE — Assessment & Plan Note (Signed)
Monitoring glucose control

## 2010-08-07 NOTE — Op Note (Signed)
NAME:  Stephen Robbins, Stephen Robbins                 ACCOUNT NO.:  [id number]   MEDICAL RECORD NO.:  [id number]          PATIENT TYPE:  AMB   LOCATION:  [facility]                          FACILITY:  [facility]   PHYSICIAN:  Christopher E. Ezzard Standing, M.D.DATE OF BIRTH:  12/09/1935   DATE OF PROCEDURE:  09/01/2007  DATE OF DISCHARGE:                               OPERATIVE REPORT   PREOPERATIVE DIAGNOSIS:  Nasal fracture with depressed left nasal bone  and left nasal airway compromise.   POSTOPERATIVE DIAGNOSIS:  Nasal fracture with depressed left nasal bone  and left nasal airway compromise.   OPERATION:  Closed reduction nasal fracture.   SURGEON:  Kristine Garbe. Ezzard Standing, MD   ANESTHESIA:  General LMA.   COMPLICATIONS:  None.   BRIEF CLINICAL NOTE:  [name] is a 75 year old gentleman who fell at  night landing on a bedside table and sustaining a nasal fracture and  laceration to his nasal dorsum.  He was seen at [facility] Emergency Room  and had a CT scan which shows minimally displaced nasal fracture and was  referred to my office for follow-up of the nasal fracture, as well as  removal of sutures.  On exam in the office, he has a depressed left  nasal bone fracture with a compromise of his left nasal airway having  some difficulty breathing through the left side.  He also has a septal  deviation to the left with a large left septal spur.  He was taken to  the operating room at this time for a closed reduction of nasal  fracture.   DESCRIPTION OF PROCEDURE:  After adequate endotracheal anesthesia, the  patient's nose was examined and decongested with cotton pledgets soaked  in Afrin.  The left nasal bone which was depressed was elevated with the  butter knife.  This improved the left nasal airway.  In addition, the  patient had some septal deviation to the left and using the long nasal  speculum, the nasal septum was pushed back toward midline.  The patient  also had a very large long sharp spur  posteriorly on the left side, and  using Tru-Cut forceps, the distal portion of the spur was amputated.  This completed the procedure.  To help to support the left nasal bone  which was depressed, gauze soaked in bacitracin ointment was packed in  the superior aspect of the left nasal cavity along with a separate piece  of Telfa soaked in bacitracin ointment placed along the floor of the  nose and adjacent to where the distal spur was amputated.  No pack was  placed on the right side.  Steri-Strips and a thermoplastic cast was  applied to the nasal dorsum.  Mahdi was awoken from anesthesia and  transferred to the recovery room postop doing well.  He received 1 g of  Ancef  intraoperatively.  We will have him followup in my office tomorrow  morning to have the nasal pack removed.  We will have him followup in 1  week to have the thermoplastic cast removed.  He is given Tylenol and  Darvocet-N  100 p.r.n. pain.           ______________________________  Kristine Garbe. Ezzard Standing, M.D.     CEN/MEDQ  D:  09/01/2007  T:  09/02/2007  Job:  [id number]   cc:   Stacie Glaze, MD

## 2010-08-21 ENCOUNTER — Telehealth: Payer: Self-pay | Admitting: *Deleted

## 2010-08-21 NOTE — Telephone Encounter (Signed)
Pt is anxious again and wonders about going back on Xanax, and Sertraline.  If Dr. Lovell Sheehan has a different idea, that would be fine too.

## 2010-08-22 MED ORDER — SERTRALINE HCL 25 MG PO TABS: 25.0000 mg | ORAL_TABLET | Freq: Every day | ORAL | Status: DC

## 2010-08-22 MED ORDER — ALPRAZOLAM 0.25 MG PO TABS: 0.2500 mg | ORAL_TABLET | Freq: Three times a day (TID) | ORAL | Status: DC | PRN

## 2010-08-22 NOTE — Telephone Encounter (Signed)
Meds called in and pt notified. He wants Dr. Lovell Sheehan to know he took his wife, Porfirio Mylar to [facility] this weekend and has appt with Dr. Sandria Manly this week for neurological symptoms.

## 2010-08-22 NOTE — Telephone Encounter (Signed)
25 mg of sertraline and .25 of xanax TID prn

## 2010-09-10 ENCOUNTER — Telehealth: Payer: Self-pay | Admitting: *Deleted

## 2010-09-10 NOTE — Telephone Encounter (Signed)
1 

## 2010-09-12 ENCOUNTER — Other Ambulatory Visit: Payer: Self-pay | Admitting: *Deleted

## 2010-09-12 MED ORDER — SERTRALINE HCL 25 MG PO TABS: 25.0000 mg | ORAL_TABLET | Freq: Every day | ORAL | Status: DC

## 2010-11-12 ENCOUNTER — Other Ambulatory Visit: Payer: Self-pay | Admitting: *Deleted

## 2010-11-12 MED ORDER — SIMVASTATIN 20 MG PO TABS: 20.0000 mg | ORAL_TABLET | Freq: Every day | ORAL | Status: DC

## 2010-11-14 ENCOUNTER — Telehealth: Payer: Self-pay | Admitting: Internal Medicine

## 2010-11-14 MED ORDER — LEVOTHYROXINE SODIUM 50 MCG PO TABS: 50.0000 ug | ORAL_TABLET | Freq: Every day | ORAL | Status: DC

## 2010-11-14 NOTE — Telephone Encounter (Signed)
Pt requesting refill on levothyroxine (SYNTHROID, LEVOTHROID) 50 MCG tablet   [facility] [phone]

## 2010-11-14 NOTE — Telephone Encounter (Signed)
sent 

## 2010-11-22 ENCOUNTER — Encounter: Payer: Self-pay | Admitting: Internal Medicine

## 2010-11-22 ENCOUNTER — Telehealth: Payer: Self-pay | Admitting: Internal Medicine

## 2010-11-22 ENCOUNTER — Ambulatory Visit (INDEPENDENT_AMBULATORY_CARE_PROVIDER_SITE_OTHER): Payer: Medicare Other | Admitting: Internal Medicine

## 2010-11-22 VITALS — BP 144/80 | HR 68 | Temp 98.2°F | Resp 16 | Ht 69.0 in | Wt 190.0 lb

## 2010-11-22 DIAGNOSIS — R42 Dizziness and giddiness: Secondary | ICD-10-CM

## 2010-11-22 DIAGNOSIS — M1990 Unspecified osteoarthritis, unspecified site: Secondary | ICD-10-CM

## 2010-11-22 DIAGNOSIS — G5600 Carpal tunnel syndrome, unspecified upper limb: Secondary | ICD-10-CM

## 2010-11-22 DIAGNOSIS — E785 Hyperlipidemia, unspecified: Secondary | ICD-10-CM

## 2010-11-22 DIAGNOSIS — E162 Hypoglycemia, unspecified: Secondary | ICD-10-CM

## 2010-11-22 DIAGNOSIS — E039 Hypothyroidism, unspecified: Secondary | ICD-10-CM

## 2010-11-22 MED ORDER — FOLIC ACID 1 MG PO TABS: 1.0000 mg | ORAL_TABLET | Freq: Every day | ORAL | Status: DC

## 2010-11-22 MED ORDER — FINASTERIDE 5 MG PO TABS: 5.0000 mg | ORAL_TABLET | Freq: Every day | ORAL | Status: DC

## 2010-11-22 NOTE — Assessment & Plan Note (Signed)
Feb 02 2010 Surgery with carpal tunnel and ulnar nerve release Still has pain but has weaned of the naprosyn

## 2010-11-22 NOTE — Progress Notes (Signed)
  Subjective:    Patient ID: Stephen Robbins, male    DOB: 04/29/1935, 75 y.o.   MRN: [id number]  HPI  Patient is a 75 year old white male who presents for followup of hyperlipidemia adult-onset diabetes and osteoarthritis.  He is generally been doing well with the exception of increased arthritic pain.  His blood pressure is well-controlled outpatient basis even though his reading today is 144/80 he reports that his outpatient blood pressures have been systolic 130 range.  He denies any chest pain shortness of breath PND or orthopnea  Review of Systems  Constitutional: Negative for fever and fatigue.  HENT: Negative for hearing loss, congestion, neck pain and postnasal drip.   Eyes: Negative for discharge, redness and visual disturbance.  Respiratory: Negative for cough, shortness of breath and wheezing.   Cardiovascular: Negative for leg swelling.  Gastrointestinal: Negative for abdominal pain, constipation and abdominal distention.  Genitourinary: Negative for urgency and frequency.  Musculoskeletal: Negative for joint swelling and arthralgias.  Skin: Negative for color change and rash.  Neurological: Negative for weakness and light-headedness.  Hematological: Negative for adenopathy.  Psychiatric/Behavioral: Negative for behavioral problems.   Past Medical History  Diagnosis Date  . Thyroid disease   . Hyperlipidemia   . Hemorrhoids   . Overactive bladder   . Diabetes mellitus   . Arthritis   . Low back pain   . Allergy    Past Surgical History  Procedure Date  . Index finger right thand   . Tonsillectomy   . Fx great toe   . Carpel tunnel release left     reports that he quit smoking about 40 years ago. He does not have any smokeless tobacco history on file. He reports that he drinks alcohol. He reports that he does not use illicit drugs. family history includes Lung cancer in an unspecified family member and Stroke in his father and unspecified family  member. Allergies  Allergen Reactions  . Penicillins     REACTION: unspecified        Objective:   Physical Exam  Nursing note and vitals reviewed. Constitutional: He appears well-developed and well-nourished.  HENT:  Head: Normocephalic and atraumatic.  Eyes: Conjunctivae are normal. Pupils are equal, round, and reactive to light.  Neck: Normal range of motion. Neck supple.  Cardiovascular: Normal rate and regular rhythm.   Pulmonary/Chest: Effort normal and breath sounds normal.  Abdominal: Soft. Bowel sounds are normal.          Assessment & Plan:  Stable hypertension despite creased blood pressure today.  Home readings show control continue medications continue exercise and diet.  Address hypogonadism reviewing the testosterone level from last visit in the 500 range discussed impact of testosterone replacement risk factors and benefits with the patient.  Reviewed osteoarthritis discussed the need to exercise and walk on a regular basis.  Reviewed diabetic risks again and portion size carbohydrate and glucose control were stressed diet was reviewed and areas of concern addressed

## 2010-11-22 NOTE — Telephone Encounter (Signed)
Sent!

## 2010-11-22 NOTE — Telephone Encounter (Signed)
Refill Finasteride and Folic Acid sent to [facility] mail order. cpx is in December. Thanks.

## 2010-11-27 ENCOUNTER — Other Ambulatory Visit: Payer: Self-pay | Admitting: Internal Medicine

## 2010-12-13 ENCOUNTER — Other Ambulatory Visit: Payer: Self-pay | Admitting: Internal Medicine

## 2010-12-13 DIAGNOSIS — R42 Dizziness and giddiness: Secondary | ICD-10-CM

## 2010-12-14 ENCOUNTER — Encounter (INDEPENDENT_AMBULATORY_CARE_PROVIDER_SITE_OTHER): Payer: Medicare Other | Admitting: *Deleted

## 2010-12-14 DIAGNOSIS — R42 Dizziness and giddiness: Secondary | ICD-10-CM

## 2010-12-14 DIAGNOSIS — I6529 Occlusion and stenosis of unspecified carotid artery: Secondary | ICD-10-CM

## 2010-12-18 ENCOUNTER — Ambulatory Visit (INDEPENDENT_AMBULATORY_CARE_PROVIDER_SITE_OTHER): Payer: Medicare Other | Admitting: Internal Medicine

## 2010-12-18 DIAGNOSIS — Z23 Encounter for immunization: Secondary | ICD-10-CM

## 2010-12-20 LAB — POCT HEMOGLOBIN-HEMACUE: Hemoglobin: 14.2

## 2011-02-05 ENCOUNTER — Other Ambulatory Visit: Payer: Self-pay | Admitting: Internal Medicine

## 2011-02-06 ENCOUNTER — Other Ambulatory Visit: Payer: Self-pay | Admitting: Internal Medicine

## 2011-02-27 ENCOUNTER — Encounter: Payer: Self-pay | Admitting: Internal Medicine

## 2011-02-27 ENCOUNTER — Ambulatory Visit (INDEPENDENT_AMBULATORY_CARE_PROVIDER_SITE_OTHER): Payer: Medicare Other | Admitting: Internal Medicine

## 2011-02-27 DIAGNOSIS — E785 Hyperlipidemia, unspecified: Secondary | ICD-10-CM

## 2011-02-27 DIAGNOSIS — Z0000 Encounter for general adult medical examination without abnormal findings: Secondary | ICD-10-CM

## 2011-02-27 DIAGNOSIS — T887XXA Unspecified adverse effect of drug or medicament, initial encounter: Secondary | ICD-10-CM

## 2011-02-27 DIAGNOSIS — N401 Enlarged prostate with lower urinary tract symptoms: Secondary | ICD-10-CM

## 2011-02-27 DIAGNOSIS — E668 Other obesity: Secondary | ICD-10-CM

## 2011-02-27 DIAGNOSIS — I10 Essential (primary) hypertension: Secondary | ICD-10-CM

## 2011-02-27 DIAGNOSIS — R339 Retention of urine, unspecified: Secondary | ICD-10-CM

## 2011-02-27 LAB — HEPATIC FUNCTION PANEL
ALT: 66 U/L — ABNORMAL HIGH (ref 0–53)
AST: 51 U/L — ABNORMAL HIGH (ref 0–37)
Albumin: 4.2 g/dL (ref 3.5–5.2)
Alkaline Phosphatase: 41 U/L (ref 39–117)
Bilirubin, Direct: 0.2 mg/dL (ref 0.0–0.3)
Total Bilirubin: 1.1 mg/dL (ref 0.3–1.2)
Total Protein: 7.1 g/dL (ref 6.0–8.3)

## 2011-02-27 LAB — BASIC METABOLIC PANEL
BUN: 15 mg/dL (ref 6–23)
CO2: 28 mEq/L (ref 19–32)
Calcium: 9 mg/dL (ref 8.4–10.5)
Chloride: 103 mEq/L (ref 96–112)
Creatinine, Ser: 0.8 mg/dL (ref 0.4–1.5)
GFR: 103.53 mL/min (ref >60.00)
Glucose, Bld: 146 mg/dL — ABNORMAL HIGH (ref 70–99)
Potassium: 4.2 mEq/L (ref 3.5–5.1)
Sodium: 138 mEq/L (ref 135–145)

## 2011-02-27 LAB — CBC WITH DIFFERENTIAL/PLATELET
Basophils Absolute: 0 10*3/uL (ref 0.0–0.1)
Basophils Relative: 0.4 % (ref 0.0–3.0)
Eosinophils Absolute: 0.1 10*3/uL (ref 0.0–0.7)
Eosinophils Relative: 1.2 % (ref 0.0–5.0)
HCT: 49.2 % (ref 39.0–52.0)
Hemoglobin: 17.1 g/dL — ABNORMAL HIGH (ref 13.0–17.0)
Lymphocytes Relative: 29.9 % (ref 12.0–46.0)
Lymphs Abs: 1.8 10*3/uL (ref 0.7–4.0)
MCHC: 34.8 g/dL (ref 30.0–36.0)
MCV: 105.2 fl — ABNORMAL HIGH (ref 78.0–100.0)
Monocytes Absolute: 0.5 10*3/uL (ref 0.1–1.0)
Monocytes Relative: 8.9 % (ref 3.0–12.0)
Neutro Abs: 3.6 10*3/uL (ref 1.4–7.7)
Neutrophils Relative %: 59.6 % (ref 43.0–77.0)
Platelets: 171 10*3/uL (ref 150.0–400.0)
RBC: 4.68 Mil/uL (ref 4.22–5.81)
RDW: 13.5 % (ref 11.5–14.6)
WBC: 6.1 10*3/uL (ref 4.5–10.5)

## 2011-02-27 LAB — LIPID PANEL
Cholesterol: 117 mg/dL (ref 0–200)
HDL: 29.7 mg/dL — ABNORMAL LOW (ref >39.00)
LDL Cholesterol: 62 mg/dL (ref 0–99)
Total CHOL/HDL Ratio: 4
Triglycerides: 126 mg/dL (ref 0.0–149.0)
VLDL: 25.2 mg/dL (ref 0.0–40.0)

## 2011-02-27 LAB — POCT URINALYSIS DIPSTICK
Bilirubin, UA: NEGATIVE
Blood, UA: NEGATIVE
Glucose, UA: NEGATIVE
Ketones, UA: NEGATIVE
Leukocytes, UA: NEGATIVE
Nitrite, UA: NEGATIVE
Spec Grav, UA: 1.02
Urobilinogen, UA: 4
pH, UA: 7

## 2011-02-27 LAB — PSA: PSA: 0.87 ng/mL (ref 0.10–4.00)

## 2011-02-27 LAB — TSH: TSH: 3.48 u[IU]/mL (ref 0.35–5.50)

## 2011-02-27 MED ORDER — HYDROCORTISONE ACE-PRAMOXINE 2.5-1 % RE CREA: TOPICAL_CREAM | RECTAL | Status: DC

## 2011-02-27 MED ORDER — TESTOSTERONE 30 MG/ACT TD SOLN: 1.0000 | Freq: Every day | TRANSDERMAL | Status: DC

## 2011-02-27 MED ORDER — NIACIN-INOSITOL 400-100 MG PO CAPS: 1.0000 | ORAL_CAPSULE | Freq: Two times a day (BID) | ORAL | Status: DC

## 2011-02-27 NOTE — Progress Notes (Signed)
Subjective:    Patient ID: Stephen Robbins, male    DOB: Apr 30, 1935, 75 y.o.   MRN: [id number]  HPI Medicare wellness exam and follow up for chronic conditions of hypothyroidism hyperlipidemia hypogonadism, benign prostatic hypertrophy and history of hypoglycemia.  Doing well reviewed medications Reviewed carotid studies  He is off the sertraline and uses occasional alprazolam.        Review of Systems  Constitutional: Negative for fever and fatigue.  HENT: Negative for hearing loss, congestion, neck pain and postnasal drip.   Eyes: Negative for discharge, redness and visual disturbance.  Respiratory: Negative for cough, shortness of breath and wheezing.   Cardiovascular: Negative for leg swelling.  Gastrointestinal: Negative for abdominal pain, constipation and abdominal distention.  Genitourinary: Negative for urgency and frequency.  Musculoskeletal: Negative for joint swelling and arthralgias.  Skin: Negative for color change and rash.  Neurological: Negative for weakness and light-headedness.  Hematological: Negative for adenopathy.  Psychiatric/Behavioral: Negative for behavioral problems.   Past Medical History  Diagnosis Date  . Thyroid disease   . Hyperlipidemia   . Hemorrhoids   . Overactive bladder   . Diabetes mellitus   . Arthritis   . Low back pain   . Allergy     History   Social History  . Marital Status: Married    Spouse Name: N/A    Number of Children: N/A  . Years of Education: N/A   Occupational History  . retired    Social History Main Topics  . Smoking status: Former Smoker    Quit date: 03/25/1970  . Smokeless tobacco: Not on file  . Alcohol Use: Yes  . Drug Use: No  . Sexually Active: Yes   Other Topics Concern  . Not on file   Social History Narrative   Regular exercise    Past Surgical History  Procedure Date  . Index finger right thand   . Tonsillectomy   . Fx great toe   . Carpel tunnel release left     Family  History  Problem Relation Age of Onset  . Stroke    . Lung cancer    . Stroke Father     Allergies  Allergen Reactions  . Penicillins     REACTION: unspecified    Current Outpatient Prescriptions on File Prior to Visit  Medication Sig Dispense Refill  . ALPRAZolam (XANAX) 0.25 MG tablet take 1 tablet by mouth three times a day if needed for anxiety  60 tablet  5  . ANDROGEL PUMP 20.25 MG/ACT (1.62%) GEL apply 1 PUMP TOPICALLY ONCE DAILY  75 g  5  . aspirin 81 MG tablet Take 81 mg by mouth daily.        . B Complex Vitamins (B COMPLEX 50) TABS Take by mouth daily.        . finasteride (PROSCAR) 5 MG tablet TAKE 1 TABLET ONCE DAILY  90 tablet  3  . folic acid (FOLVITE) 1 MG tablet TAKE 1 TABLET DAILY  90 tablet  3  . Glucosamine-Chondroit-Vit C-Mn (GLUCOSAMINE CHONDR 500 COMPLEX) CAPS Take 1 capsule by mouth 2 (two) times daily.    0  . hydrocortisone 0.5 % cream Apply topically 2 (two) times daily as needed.        Bertram Gala Glycol-Propyl Glycol (SYSTANE ULTRA) 0.4-0.3 % SOLN Apply to eye as needed.        . sertraline (ZOLOFT) 25 MG tablet Take 1 tablet (25 mg total) by mouth daily.  90 tablet  3  . simvastatin (ZOCOR) 20 MG tablet Take 1 tablet (20 mg total) by mouth at bedtime.  90 tablet  3  . SYNTHROID 50 MCG tablet TAKE 1 TABLET DAILY  90 tablet  3    BP 134/80  Pulse 64  Temp 98.3 F (36.8 C)  Resp 16  Ht 5\' 9"  (1.753 m)  Wt 190 lb (86.183 kg)  BMI 28.06 kg/m2        Objective:   Physical Exam  Nursing note and vitals reviewed. Constitutional: He is oriented to person, place, and time. He appears well-developed and well-nourished.  HENT:  Head: Normocephalic and atraumatic.  Eyes: Conjunctivae are normal. Pupils are equal, round, and reactive to light.  Neck: Normal range of motion. Neck supple.  Cardiovascular: Normal rate and regular rhythm.   Pulmonary/Chest: Effort normal and breath sounds normal.  Abdominal: Soft. Bowel sounds are normal.    Genitourinary: Rectum normal and prostate normal.  Musculoskeletal: Normal range of motion.  Neurological: He is alert and oriented to person, place, and time.  Skin: Skin is warm and dry.  Psychiatric: He has a normal mood and affect. His behavior is normal.          Assessment & Plan:   Patient presents for yearly preventative medicine examination.   all immunizations and health maintenance protocols were reviewed with the patient and they are up to date with these protocols.   screening laboratory values were reviewed with the patient including screening of hyperlipidemia PSA renal function and hepatic function.   There medications past medical history social history problem list and allergies were reviewed in detail.   Goals were established with regard to weight loss exercise diet in compliance with medications   Subjective:    Stephen Robbins is a 75 y.o. male who presents for Medicare Annual/Subsequent preventive examination.   Preventive Screening-Counseling & Management  Tobacco History  Smoking status  . Former Smoker  . Quit date: 03/25/1970  Smokeless tobacco  . Not on file    Problems Prior to Visit 1.   Current Problems (verified) Patient Active Problem List  Diagnoses  . HYPOTHYROIDISM  . HYPOGLYCEMIA, UNSPECIFIED  . HYPOGONADISM, MALE  . HYPERLIPIDEMIA  . METABOLIC SYNDROME X  . DEPRESSION, MILD  . CARPAL TUNNEL SYNDROME, BILATERAL  . HEARING LOSS  . INTERNAL HEMORRHOIDS  . SINUSITIS- ACUTE-NOS  . UPPER RESPIRATORY INFECTION, ACUTE, WITH BRONCHITIS  . ALLERGIC RHINITIS  . OVERACTIVE BLADDER  . BENIGN PROSTATIC HYPERTROPHY, WITH OBSTRUCTION  . OSTEOARTHRITIS  . LOW BACK PAIN  . HAND PAIN, LEFT  . PALPITATIONS, CHRONIC  . HYPERGLYCEMIA, BORDERLINE  . NOSE FRACTURE  . UNS ADVRS EFF UNS RX MEDICINAL&BIOLOGICAL SBSTNC    Medications Prior to Visit Current Outpatient Prescriptions on File Prior to Visit  Medication Sig Dispense Refill  .  ALPRAZolam (XANAX) 0.25 MG tablet take 1 tablet by mouth three times a day if needed for anxiety  60 tablet  5  . aspirin 81 MG tablet Take 81 mg by mouth daily.        . B Complex Vitamins (B COMPLEX 50) TABS Take by mouth daily.        . finasteride (PROSCAR) 5 MG tablet TAKE 1 TABLET ONCE DAILY  90 tablet  3  . folic acid (FOLVITE) 1 MG tablet TAKE 1 TABLET DAILY  90 tablet  3  . Glucosamine-Chondroit-Vit C-Mn (GLUCOSAMINE CHONDR 500 COMPLEX) CAPS Take 1 capsule by mouth 2 (two)  times daily.    0  . hydrocortisone 0.5 % cream Apply topically 2 (two) times daily as needed.        Bertram Gala Glycol-Propyl Glycol (SYSTANE ULTRA) 0.4-0.3 % SOLN Apply to eye as needed.        . simvastatin (ZOCOR) 20 MG tablet Take 1 tablet (20 mg total) by mouth at bedtime.  90 tablet  3  . SYNTHROID 50 MCG tablet TAKE 1 TABLET DAILY  90 tablet  3    Current Medications (verified) Current Outpatient Prescriptions  Medication Sig Dispense Refill  . ALPRAZolam (XANAX) 0.25 MG tablet take 1 tablet by mouth three times a day if needed for anxiety  60 tablet  5  . aspirin 81 MG tablet Take 81 mg by mouth daily.        . B Complex Vitamins (B COMPLEX 50) TABS Take by mouth daily.        . finasteride (PROSCAR) 5 MG tablet TAKE 1 TABLET ONCE DAILY  90 tablet  3  . folic acid (FOLVITE) 1 MG tablet TAKE 1 TABLET DAILY  90 tablet  3  . Glucosamine-Chondroit-Vit C-Mn (GLUCOSAMINE CHONDR 500 COMPLEX) CAPS Take 1 capsule by mouth 2 (two) times daily.    0  . hydrocortisone 0.5 % cream Apply topically 2 (two) times daily as needed.        . hydrocortisone-pramoxine (ANALPRAM-HC) 2.5-1 % rectal cream Place rectally as directed.  30 g  6  . Polyethyl Glycol-Propyl Glycol (SYSTANE ULTRA) 0.4-0.3 % SOLN Apply to eye as needed.        . simvastatin (ZOCOR) 20 MG tablet Take 1 tablet (20 mg total) by mouth at bedtime.  90 tablet  3  . SYNTHROID 50 MCG tablet TAKE 1 TABLET DAILY  90 tablet  3  . Testosterone (AXIRON) 30 MG/ACT  SOLN Place 1 Applicatorful onto the skin daily.  90 mL  5     Allergies (verified) Penicillins   PAST HISTORY  Family History Family History  Problem Relation Age of Onset  . Stroke    . Lung cancer    . Stroke Father     Social History History  Substance Use Topics  . Smoking status: Former Smoker    Quit date: 03/25/1970  . Smokeless tobacco: Not on file  . Alcohol Use: Yes    Are there smokers in your home (other than you)?  No  Risk Factors Current exercise habits: Gym/ health club routine includes cardio and treadmill.  Dietary issues discussed: reviewed lipid lowing diet   Cardiac risk factors: advanced age (older than 8 for men, 78 for women).  Depression Screen (Note: if answer to either of the following is "Yes", a more complete depression screening is indicated)   Q1: Over the past two weeks, have you felt down, depressed or hopeless? no  Q2: Over the past two weeks, have you felt little interest or pleasure in doing things? no  Have you lost interest or pleasure in daily life? no  Do you often feel hopeless? no  Do you cry easily over simple problems? No  Activities of Daily Living In your present state of health, do you have any difficulty performing the following activities?:  Driving? No Managing money?  No Feeding yourself? No Getting from bed to chair? No Climbing a flight of stairs? No Preparing food and eating?: no Bathing or showering? No Getting dressed: No Getting to the toilet? No Using the toilet:No Moving around from  place to place: No In the past year have you fallen or had a near fall?:No   Are you sexually active?  Yes  Do you have more than one partner?  No  Hearing Difficulties: No Do you often ask people to speak up or repeat themselves? No Do you experience ringing or noises in your ears? No Do you have difficulty understanding soft or whispered voices? No   Do you feel that you have a problem with memory? No  Do you  often misplace items? No  Do you feel safe at home?  No  Cognitive Testing  Alert? Yes  Normal Appearance?Yes  Oriented to person? Yes  Place? Yes   Time? Yes  Recall of three objects?  Yes  Can perform simple calculations? Yes  Displays appropriate judgment?Yes  Can read the correct time from a watch face?Yes   Advanced Directives have been discussed with the patient? Yes   List the Names of Other Physician/Practitioners you currently use: 1.    Indicate any recent Medical Services you may have received from other than [facility] providers in the past year (date may be approximate).  Immunization History  Administered Date(s) Administered  . Influenza Split 12/18/2010  . Influenza Whole 01/02/2007, 12/15/2007, 01/11/2009  . Pneumococcal Polysaccharide 03/25/2002  . Td 03/25/2005    Screening Tests Health Maintenance  Topic Date Due  . Zostavax  07/23/1997  . Pneumococcal Polysaccharide Vaccine Age 79 And Over  07/24/2002  . Influenza Vaccine  12/24/2011  . Tetanus/tdap  03/26/2015  . Colonoscopy  08/05/2015    All answers were reviewed with the patient and necessary referrals were made:  Carrie Mew   02/27/2011   History reviewed: allergies, current medications, past family history, past medical history, past social history, past surgical history and problem list  Review of Systems A comprehensive review of systems was negative.    Objective:     Vision by Snellen chart: right eye:20/20, left eye:20/20 Blood pressure 134/80, pulse 64, temperature 98.3 F (36.8 C), resp. rate 16, height 5\' 9"  (1.753 m), weight 190 lb (86.183 kg). Body mass index is 28.06 kg/(m^2).  BP 134/80  Pulse 64  Temp 98.3 F (36.8 C)  Resp 16  Ht 5\' 9"  (1.753 m)  Wt 190 lb (86.183 kg)  BMI 28.06 kg/m2  General Appearance:    Alert, cooperative, no distress, appears stated age  Head:    Normocephalic, without obvious abnormality, atraumatic  Eyes:    PERRL, conjunctiva/corneas clear,  EOM's intact, fundi    benign, both eyes       Ears:    Normal TM's and external ear canals, both ears  Nose:   Nares normal, septum midline, mucosa normal, no drainage    or sinus tenderness  Throat:   Lips, mucosa, and tongue normal; teeth and gums normal  Neck:   Supple, symmetrical, trachea midline, no adenopathy;       thyroid:  No enlargement/tenderness/nodules; no carotid   bruit or JVD  Back:     Symmetric, no curvature, ROM normal, no CVA tenderness  Lungs:     Clear to auscultation bilaterally, respirations unlabored  Chest wall:    No tenderness or deformity  Heart:    Regular rate and rhythm, S1 and S2 normal, no murmur, rub   or gallop  Abdomen:     Soft, non-tender, bowel sounds active all four quadrants,    no masses, no organomegaly  Genitalia:    Normal male without lesion,  discharge or tenderness  Rectal:    Normal tone, normal prostate, no masses or tenderness;   guaiac negative stool  Extremities:   Extremities normal, atraumatic, no cyanosis or edema  Pulses:   2+ and symmetric all extremities  Skin:   Skin color, texture, turgor normal, no rashes or lesions  Lymph nodes:   Cervical, supraclavicular, and axillary nodes normal  Neurologic:   CNII-XII intact. Normal strength, sensation and reflexes      throughout       Assessment:     . Patient presents for yearly preventative medicine examination.   all immunizations and health maintenance protocols were reviewed with the patient and they are up to date with these protocols.   screening laboratory values were reviewed with the patient including screening of hyperlipidemia PSA renal function and hepatic function.   There medications past medical history social history problem list and allergies were reviewed in detail.   Goals were established with regard to weight loss exercise diet in compliance with medications Monitor the patient's cholesterol his thyroid his testosterone today and screening lab will look  at a basic metabolic panel to see what his renal function as well as his potassium.  Has a history of hypoglycemia and has had some low blood sugar episodes she has some mild positional dizziness upon rising rapidly from a lying position to a sitting position he does have carotid disease and we will add niacin to increase his HDL and monitor his total lipid panel he will followup in 3 months     Plan:     During the course of the visit the patient was educated and counseled about appropriate screening and preventive services including:    Influenza vaccine  Advanced directives: power of attorney for healthcare on file  Diet review for nutrition referral? Yes ____  Not Indicated ____   Patient Instructions (the written plan) was given to the patient.  Medicare Attestation I have personally reviewed: The patient's medical and social history Their use of alcohol, tobacco or illicit drugs Their current medications and supplements The patient's functional ability including ADLs,fall risks, home safety risks, cognitive, and hearing and visual impairment Diet and physical activities Evidence for depression or mood disorders  The patient's weight, height, BMI, and visual acuity have been recorded in the chart.  I have made referrals, counseling, and provided education to the patient based on review of the above and I have provided the patient with a written personalized care plan for preventive services.     Darryll Capers EDWARD   02/27/2011

## 2011-02-27 NOTE — Patient Instructions (Addendum)
The patient is instructed to continue all medications as prescribed. Schedule followup with check out clerk upon leaving the clinic  Cholesterol Control Diet Cholesterol levels in your body are determined significantly by your diet. Cholesterol levels may also be related to heart disease. The following material helps to explain this relationship and discusses what you can do to help keep your heart healthy. Not all cholesterol is bad. Low-density lipoprotein (LDL) cholesterol is the "bad" cholesterol. It may cause fatty deposits to build up inside your arteries. High-density lipoprotein (HDL) cholesterol is "good." It helps to remove the "bad" LDL cholesterol from your blood. Cholesterol is a very important risk factor for heart disease. Other risk factors are high blood pressure, smoking, stress, heredity, and weight. The heart muscle gets its supply of blood through the coronary arteries. If your LDL cholesterol is high and your HDL cholesterol is low, you are at risk for having fatty deposits build up in your coronary arteries. This leaves less room through which blood can flow. Without sufficient blood and oxygen, the heart muscle cannot function properly and you may feel chest pains (angina pectoris). When a coronary artery closes up entirely, a part of the heart muscle may die, causing a heart attack (myocardial infarction). CHECKING CHOLESTEROL When your caregiver sends your blood to a lab to be analyzed for cholesterol, a complete lipid (fat) profile may be done. With this test, the total amount of cholesterol and levels of LDL and HDL are determined. Triglycerides are a type of fat that circulates in the blood and can also be used to determine heart disease risk. The list below describes what the numbers should be: Test: Total Cholesterol.  Less than 200 mg/dl.  Test: LDL "bad cholesterol."  Less than 100 mg/dl.   Less than 70 mg/dl if you are at very high risk of a heart attack or sudden  cardiac death.  Test: HDL "good cholesterol."  Greater than 50 mg/dl for women.   Greater than 40 mg/dl for men.  Test: Triglycerides.  Less than 150 mg/dl.  CONTROLLING CHOLESTEROL WITH DIET Although exercise and lifestyle factors are important, your diet is key. That is because certain foods are known to raise cholesterol and others to lower it. The goal is to balance foods for their effect on cholesterol and more importantly, to replace saturated and trans fat with other types of fat, such as monounsaturated fat, polyunsaturated fat, and omega-3 fatty acids. On average, a person should consume no more than 15 to 17 g of saturated fat daily. Saturated and trans fats are considered "bad" fats, and they will raise LDL cholesterol. Saturated fats are primarily found in animal products such as meats, butter, and cream. However, that does not mean you need to sacrifice all your favorite foods. Today, there are good tasting, low-fat, low-cholesterol substitutes for most of the things you like to eat. Choose low-fat or nonfat alternatives. Choose round or loin cuts of red meat, since these types of cuts are lowest in fat and cholesterol. Chicken (without the skin), fish, veal, and ground [place] breast are excellent choices. Eliminate fatty meats, such as hot dogs and salami. Even shellfish have little or no saturated fat. Have a 3 oz (85 g) portion when you eat lean meat, poultry, or fish. Trans fats are also called "partially hydrogenated oils." They are oils that have been scientifically manipulated so that they are solid at room temperature resulting in a longer shelf life and improved taste and texture of foods in  which they are added. Trans fats are found in stick margarine, some tub margarines, cookies, crackers, and baked goods.  When baking and cooking, oils are an excellent substitute for butter. The monounsaturated oils are especially beneficial since it is believed they lower LDL and raise HDL.  The oils you should avoid entirely are saturated tropical oils, such as coconut and palm.  Remember to eat liberally from food groups that are naturally free of saturated and trans fat, including fish, fruit, vegetables, beans, grains (barley, rice, couscous, bulgur wheat), and pasta (without cream sauces).  IDENTIFYING FOODS THAT LOWER CHOLESTEROL  Soluble fiber may lower your cholesterol. This type of fiber is found in fruits such as apples, vegetables such as broccoli, potatoes, and carrots, legumes such as beans, peas, and lentils, and grains such as barley. Foods fortified with plant sterols (phytosterol) may also lower cholesterol. You should eat at least 2 g per day of these foods for a cholesterol lowering effect.  Read package labels to identify low-saturated fats, trans fats free, and low-fat foods at the supermarket. Select cheeses that have only 2 to 3 g saturated fat per ounce. Use a heart-healthy tub margarine that is free of trans fats or partially hydrogenated oil. When buying baked goods (cookies, crackers), avoid partially hydrogenated oils. Breads and muffins should be made from whole grains (whole-wheat or whole oat flour, instead of "flour" or "enriched flour"). Buy non-creamy canned soups with reduced salt and no added fats.  FOOD PREPARATION TECHNIQUES  Never deep-fry. If you must fry, either stir-fry, which uses very little fat, or use non-stick cooking sprays. When possible, broil, bake, or roast meats, and steam vegetables. Instead of dressing vegetables with butter or margarine, use lemon and herbs, applesauce and cinnamon (for squash and sweet potatoes), nonfat yogurt, salsa, and low-fat dressings for salads.  LOW-SATURATED FAT / LOW-FAT FOOD SUBSTITUTES Meats / Saturated Fat (g)  Avoid: Steak, marbled (3 oz/85 g) / 11 g   Choose: Steak, lean (3 oz/85 g) / 4 g   Avoid: Hamburger (3 oz/85 g) / 7 g   Choose: Hamburger, lean (3 oz/85 g) / 5 g   Avoid: Ham (3 oz/85 g) / 6 g     Choose: Ham, lean cut (3 oz/85 g) / 2.4 g   Avoid: Chicken, with skin, dark meat (3 oz/85 g) / 4 g   Choose: Chicken, skin removed, dark meat (3 oz/85 g) / 2 g   Avoid: Chicken, with skin, light meat (3 oz/85 g) / 2.5 g   Choose: Chicken, skin removed, light meat (3 oz/85 g) / 1 g  Dairy / Saturated Fat (g)  Avoid: Whole milk (1 cup) / 5 g   Choose: Low-fat milk, 2% (1 cup) / 3 g   Choose: Low-fat milk, 1% (1 cup) / 1.5 g   Choose: Skim milk (1 cup) / 0.3 g   Avoid: Hard cheese (1 oz/28 g) / 6 g   Choose: Skim milk cheese (1 oz/28 g) / 2 to 3 g   Avoid: Cottage cheese, 4% fat (1 cup) / 6.5 g   Choose: Low-fat cottage cheese, 1% fat (1 cup) / 1.5 g   Avoid: Ice cream (1 cup) / 9 g   Choose: Sherbet (1 cup) / 2.5 g   Choose: Nonfat frozen yogurt (1 cup) / 0.3 g   Choose: Frozen fruit bar / trace   Avoid: Whipped cream (1 tbs) / 3.5 g   Choose: Nondairy whipped topping (1 tbs) /  1 g  Condiments / Saturated Fat (g)  Avoid: Mayonnaise (1 tbs) / 2 g   Choose: Low-fat mayonnaise (1 tbs) / 1 g   Avoid: Butter (1 tbs) / 7 g   Choose: Extra light margarine (1 tbs) / 1 g   Avoid: Coconut oil (1 tbs) / 11.8 g   Choose: Olive oil (1 tbs) / 1.8 g   Choose: Corn oil (1 tbs) / 1.7 g   Choose: Safflower oil (1 tbs) / 1.2 g   Choose: Sunflower oil (1 tbs) / 1.4 g   Choose: Soybean oil (1 tbs) / 2.4 g   Choose: Canola oil (1 tbs) / 1 g  Document Released: 03/11/2005 Document Revised: 11/21/2010 Document Reviewed: 08/30/2010 [facility] Patient Information 2012 [place], [place].

## 2011-03-21 ENCOUNTER — Telehealth: Payer: Self-pay | Admitting: *Deleted

## 2011-03-21 MED ORDER — AZITHROMYCIN 250 MG PO TABS
ORAL_TABLET | ORAL | Status: AC
Start: 2011-03-21 — End: 2011-03-26

## 2011-03-21 MED ORDER — HYDROCODONE-HOMATROPINE 5-1.5 MG/5ML PO SYRP: 5.0000 mL | ORAL_SOLUTION | Freq: Three times a day (TID) | ORAL | Status: AC | PRN

## 2011-03-21 NOTE — Telephone Encounter (Signed)
Correction- per dr Lovell Sheehan- send in z pack and  hycodan 6oz 1 tsp every 8 hrs prn

## 2011-03-21 NOTE — Telephone Encounter (Signed)
Per dr Lovell Sheehan may have z pack and mucinex cough and flu

## 2011-03-21 NOTE — Telephone Encounter (Signed)
C/o productive cough all night- slight fever- no sore throat--had flu vaccine---pt will ch eck with  [facility] after 12 noon

## 2011-05-15 ENCOUNTER — Other Ambulatory Visit: Payer: Self-pay | Admitting: Internal Medicine

## 2011-05-15 MED ORDER — TESTOSTERONE 30 MG/ACT TD SOLN: 1.0000 | Freq: Every day | TRANSDERMAL | Status: DC

## 2011-05-15 NOTE — Telephone Encounter (Signed)
Pt lost rx for androgel call into [facility] [phone]

## 2011-05-15 NOTE — Telephone Encounter (Signed)
Called into pharmacy

## 2011-05-28 ENCOUNTER — Ambulatory Visit: Payer: Medicare Other | Admitting: Internal Medicine

## 2011-05-31 ENCOUNTER — Ambulatory Visit (INDEPENDENT_AMBULATORY_CARE_PROVIDER_SITE_OTHER): Payer: Medicare Other | Admitting: Internal Medicine

## 2011-05-31 ENCOUNTER — Encounter: Payer: Self-pay | Admitting: Internal Medicine

## 2011-05-31 VITALS — BP 141/83 | HR 72 | Temp 98.3°F | Resp 16 | Ht 69.0 in | Wt 184.0 lb

## 2011-05-31 DIAGNOSIS — E039 Hypothyroidism, unspecified: Secondary | ICD-10-CM | POA: Diagnosis not present

## 2011-05-31 DIAGNOSIS — R7309 Other abnormal glucose: Secondary | ICD-10-CM | POA: Diagnosis not present

## 2011-05-31 DIAGNOSIS — I10 Essential (primary) hypertension: Secondary | ICD-10-CM | POA: Diagnosis not present

## 2011-05-31 DIAGNOSIS — E785 Hyperlipidemia, unspecified: Secondary | ICD-10-CM | POA: Diagnosis not present

## 2011-05-31 DIAGNOSIS — R739 Hyperglycemia, unspecified: Secondary | ICD-10-CM

## 2011-05-31 LAB — BASIC METABOLIC PANEL
BUN: 10 mg/dL (ref 6–23)
CO2: 26 mEq/L (ref 19–32)
Calcium: 9.4 mg/dL (ref 8.4–10.5)
Chloride: 101 mEq/L (ref 96–112)
Creatinine, Ser: 0.7 mg/dL (ref 0.4–1.5)
GFR: 121.2 mL/min (ref >60.00)
Glucose, Bld: 212 mg/dL — ABNORMAL HIGH (ref 70–99)
Potassium: 3.6 mEq/L (ref 3.5–5.1)
Sodium: 135 mEq/L (ref 135–145)

## 2011-05-31 LAB — TSH: TSH: 2.44 u[IU]/mL (ref 0.35–5.50)

## 2011-05-31 LAB — T3, FREE: T3, Free: 2.7 pg/mL (ref 2.3–4.2)

## 2011-05-31 LAB — T4, FREE: Free T4: 0.9 ng/dL (ref 0.60–1.60)

## 2011-05-31 NOTE — Patient Instructions (Addendum)
The patient is instructed to continue all medications as prescribed. Schedule followup with check out clerk upon leaving the clinic The flush free niacin should help raise the HDL or good cholesterol.

## 2011-05-31 NOTE — Progress Notes (Signed)
Subjective:    Patient ID: Stephen Robbins, male    DOB: [birth year], 76 y.o.   MRN: [id number]  HPI Follow up blood pressure Also monitoring hypothyroidism he is on 50 mcg of Synthroid and has experienced some increased symptoms of fatigue.  He also has hyperlipidemia but has not been able to get to flush free niacin and we talked about this as a part of his therapy.       Review of Systems  Constitutional: Negative for fever and fatigue.  HENT: Negative for hearing loss, congestion, neck pain and postnasal drip.   Eyes: Negative for discharge, redness and visual disturbance.  Respiratory: Negative for cough, shortness of breath and wheezing.   Cardiovascular: Negative for leg swelling.  Gastrointestinal: Negative for abdominal pain, constipation and abdominal distention.  Genitourinary: Negative for urgency and frequency.  Musculoskeletal: Negative for joint swelling and arthralgias.  Skin: Negative for color change and rash.  Neurological: Negative for weakness and light-headedness.  Hematological: Negative for adenopathy.  Psychiatric/Behavioral: Negative for behavioral problems.   Past Medical History  Diagnosis Date  . Thyroid disease   . Hyperlipidemia   . Hemorrhoids   . Overactive bladder   . Diabetes mellitus   . Arthritis   . Low back pain   . Allergy     History   Social History  . Marital Status: Married    Spouse Name: N/A    Number of Children: N/A  . Years of Education: N/A   Occupational History  . retired    Social History Main Topics  . Smoking status: Former Smoker    Quit date: 03/25/1970  . Smokeless tobacco: Not on file  . Alcohol Use: Yes  . Drug Use: No  . Sexually Active: Yes   Other Topics Concern  . Not on file   Social History Narrative   Regular exercise    Past Surgical History  Procedure Date  . Index finger right thand   . Tonsillectomy   . Fx great toe   . Carpel tunnel release left     Family History  Problem  Relation Age of Onset  . Stroke    . Lung cancer    . Stroke Father     Allergies  Allergen Reactions  . Penicillins     REACTION: unspecified    Current Outpatient Prescriptions on File Prior to Visit  Medication Sig Dispense Refill  . ALPRAZolam (XANAX) 0.25 MG tablet take 1 tablet by mouth three times a day if needed for anxiety  60 tablet  5  . aspirin 81 MG tablet Take 81 mg by mouth daily.        . B Complex Vitamins (B COMPLEX 50) TABS Take by mouth daily.        . finasteride (PROSCAR) 5 MG tablet TAKE 1 TABLET ONCE DAILY  90 tablet  3  . folic acid (FOLVITE) 1 MG tablet TAKE 1 TABLET DAILY  90 tablet  3  . Glucosamine-Chondroit-Vit C-Mn (GLUCOSAMINE CHONDR 500 COMPLEX) CAPS Take 1 capsule by mouth 2 (two) times daily.    0  . hydrocortisone 0.5 % cream Apply topically 2 (two) times daily as needed.        . hydrocortisone-pramoxine (ANALPRAM-HC) 2.5-1 % rectal cream Place rectally as directed.  30 g  6  . Niacin-Inositol (NIACIN FLUSH FREE) 400-100 MG CAPS Take 1 capsule by mouth 2 (two) times daily with a meal.  30 each  0  . Polyethyl Glycol-Propyl  Glycol (SYSTANE ULTRA) 0.4-0.3 % SOLN Apply to eye as needed.        . simvastatin (ZOCOR) 20 MG tablet Take 1 tablet (20 mg total) by mouth at bedtime.  90 tablet  3  . SYNTHROID 50 MCG tablet TAKE 1 TABLET DAILY  90 tablet  3  . Testosterone (AXIRON) 30 MG/ACT SOLN Place 1 Applicatorful onto the skin daily.  90 mL  5    BP 141/83  Pulse 72  Temp 98.3 F (36.8 C)  Resp 16  Ht 5\' 9"  (1.753 m)  Wt 184 lb (83.462 kg)  BMI 27.17 kg/m2       Objective:   Physical Exam  Nursing note and vitals reviewed. Constitutional: He appears well-developed and well-nourished.  HENT:  Head: Normocephalic and atraumatic.  Eyes: Conjunctivae are normal. Pupils are equal, round, and reactive to light.  Neck: Normal range of motion. Neck supple.  Cardiovascular: Normal rate and regular rhythm.   Pulmonary/Chest: Effort normal and  breath sounds normal.  Abdominal: Soft. Bowel sounds are normal.          Assessment & Plan:  Monitor her TSH T3 free T4 free for thyroid and adjust Synthroid as indicated.  Continue current therapy for blood pressure.  Continue current lipid protocol including reinforces our flush free niacin. Check basic metabolic panel today because of screening blood work as last visit showing elevated blood glucose make sure that this does not represent early diabetes.

## 2011-06-10 ENCOUNTER — Encounter: Payer: Self-pay | Admitting: Family

## 2011-06-10 ENCOUNTER — Ambulatory Visit (INDEPENDENT_AMBULATORY_CARE_PROVIDER_SITE_OTHER): Payer: Medicare Other | Admitting: Family

## 2011-06-10 VITALS — BP 138/70 | Temp 97.5°F | Wt 184.0 lb

## 2011-06-10 DIAGNOSIS — J069 Acute upper respiratory infection, unspecified: Secondary | ICD-10-CM

## 2011-06-10 MED ORDER — AZITHROMYCIN 250 MG PO TABS: ORAL_TABLET | ORAL | Status: AC

## 2011-06-10 NOTE — Progress Notes (Signed)
Subjective:    Patient ID: Stephen Robbins, male    DOB: 08/10/1935, 76 y.o.   MRN: [id number]  HPI 76 year old white male, nonsmoker, patient of Dr. Lovell Sheehan is an today with complaints of sore throat, runny nose, congestion and one on for 5 days. He's been taking over-the-counter Claritin without relief. Patient reports low-grade fever, chills and mild muscle ache. Patient denies any lightheadedness, dizziness, chest pain, palpitations, shortness of breath or edema.   Review of Systems  Constitutional: Positive for chills and fatigue.  HENT: Positive for congestion, sore throat, rhinorrhea and postnasal drip.   Eyes: Negative.   Respiratory: Positive for cough.   Cardiovascular: Negative.   Gastrointestinal: Negative.   Skin: Negative.   Neurological: Negative.   Hematological: Negative.   Psychiatric/Behavioral: Negative.    Past Medical History  Diagnosis Date  . Thyroid disease   . Hyperlipidemia   . Hemorrhoids   . Overactive bladder   . Diabetes mellitus   . Arthritis   . Low back pain   . Allergy     History   Social History  . Marital Status: Married    Spouse Name: N/A    Number of Children: N/A  . Years of Education: N/A   Occupational History  . retired    Social History Main Topics  . Smoking status: Former Smoker    Quit date: 03/25/1970  . Smokeless tobacco: Not on file  . Alcohol Use: Yes  . Drug Use: No  . Sexually Active: Yes   Other Topics Concern  . Not on file   Social History Narrative   Regular exercise    Past Surgical History  Procedure Date  . Index finger right thand   . Tonsillectomy   . Fx great toe   . Carpel tunnel release left     Family History  Problem Relation Age of Onset  . Stroke    . Lung cancer    . Stroke Father     Allergies  Allergen Reactions  . Penicillins     REACTION: unspecified    Current Outpatient Prescriptions on File Prior to Visit  Medication Sig Dispense Refill  . ALPRAZolam (XANAX)  0.25 MG tablet take 1 tablet by mouth three times a day if needed for anxiety  60 tablet  5  . aspirin 81 MG tablet Take 81 mg by mouth daily.        . B Complex Vitamins (B COMPLEX 50) TABS Take by mouth daily.        . finasteride (PROSCAR) 5 MG tablet TAKE 1 TABLET ONCE DAILY  90 tablet  3  . folic acid (FOLVITE) 1 MG tablet TAKE 1 TABLET DAILY  90 tablet  3  . Glucosamine-Chondroit-Vit C-Mn (GLUCOSAMINE CHONDR 500 COMPLEX) CAPS Take 1 capsule by mouth 2 (two) times daily.    0  . hydrocortisone 0.5 % cream Apply topically 2 (two) times daily as needed.        . hydrocortisone-pramoxine (ANALPRAM-HC) 2.5-1 % rectal cream Place rectally as directed.  30 g  6  . Niacin-Inositol (NIACIN FLUSH FREE) 400-100 MG CAPS Take 1 capsule by mouth 2 (two) times daily with a meal.  30 each  0  . Polyethyl Glycol-Propyl Glycol (SYSTANE ULTRA) 0.4-0.3 % SOLN Apply to eye as needed.        . simvastatin (ZOCOR) 20 MG tablet Take 1 tablet (20 mg total) by mouth at bedtime.  90 tablet  3  . SYNTHROID 50  MCG tablet TAKE 1 TABLET DAILY  90 tablet  3  . Testosterone (AXIRON) 30 MG/ACT SOLN Place 1 Applicatorful onto the skin daily.  90 mL  5    BP 138/70  Temp(Src) 97.5 F (36.4 C) (Oral)  Wt 184 lb (83.462 kg)chart    Objective:   Physical Exam  Constitutional: He is oriented to person, place, and time. He appears well-developed and well-nourished.  HENT:  Right Ear: External ear normal.  Left Ear: External ear normal.  Nose: Nose normal.  Mouth/Throat: Oropharynx is clear and moist.  Neck: Normal range of motion. Neck supple.  Cardiovascular: Normal rate, regular rhythm and normal heart sounds.   Pulmonary/Chest: Effort normal. He has wheezes.       Very mild expiratory wheeze noted to the left upper lobe.  Abdominal: Soft. Bowel sounds are normal.  Neurological: He is alert and oriented to person, place, and time.  Skin: Skin is warm and dry.  Psychiatric: He has a normal mood and affect.           Assessment & Plan:  Assessment: Upper respiratory infection, cough  Plan: Claritin-D or Zyrtec because the counter twice a day. The pack as directed. Rest. Drink plenty of fluids. Patient probably office if symptoms worsen or persist, recheck as scheduled and when necessary.

## 2011-06-10 NOTE — Patient Instructions (Addendum)
1. Zyrtec-D or Claritin-D twice a day.   Upper Respiratory Infection, Adult An upper respiratory infection (URI) is also sometimes known as the common cold. The upper respiratory tract includes the nose, sinuses, throat, trachea, and bronchi. Bronchi are the airways leading to the lungs. Most people improve within 1 week, but symptoms can last up to 2 weeks. A residual cough may last even longer.  CAUSES Many different viruses can infect the tissues lining the upper respiratory tract. The tissues become irritated and inflamed and often become very moist. Mucus production is also common. A cold is contagious. You can easily spread the virus to others by oral contact. This includes kissing, sharing a glass, coughing, or sneezing. Touching your mouth or nose and then touching a surface, which is then touched by another person, can also spread the virus. SYMPTOMS  Symptoms typically develop 1 to 3 days after you come in contact with a cold virus. Symptoms vary from person to person. They may include:  Runny nose.   Sneezing.   Nasal congestion.   Sinus irritation.   Sore throat.   Loss of voice (laryngitis).   Cough.   Fatigue.   Muscle aches.   Loss of appetite.   Headache.   Low-grade fever.  DIAGNOSIS  You might diagnose your own cold based on familiar symptoms, since most people get a cold 2 to 3 times a year. Your caregiver can confirm this based on your exam. Most importantly, your caregiver can check that your symptoms are not due to another disease such as strep throat, sinusitis, pneumonia, asthma, or epiglottitis. Blood tests, throat tests, and X-rays are not necessary to diagnose a common cold, but they may sometimes be helpful in excluding other more serious diseases. Your caregiver will decide if any further tests are required. RISKS AND COMPLICATIONS  You may be at risk for a more severe case of the common cold if you smoke cigarettes, have chronic heart disease (such as  heart failure) or lung disease (such as asthma), or if you have a weakened immune system. The very young and very old are also at risk for more serious infections. Bacterial sinusitis, middle ear infections, and bacterial pneumonia can complicate the common cold. The common cold can worsen asthma and chronic obstructive pulmonary disease (COPD). Sometimes, these complications can require emergency medical care and may be life-threatening. PREVENTION  The best way to protect against getting a cold is to practice good hygiene. Avoid oral or hand contact with people with cold symptoms. Wash your hands often if contact occurs. There is no clear evidence that vitamin C, vitamin E, echinacea, or exercise reduces the chance of developing a cold. However, it is always recommended to get plenty of rest and practice good nutrition. TREATMENT  Treatment is directed at relieving symptoms. There is no cure. Antibiotics are not effective, because the infection is caused by a virus, not by bacteria. Treatment may include:  Increased fluid intake. Sports drinks offer valuable electrolytes, sugars, and fluids.   Breathing heated mist or steam (vaporizer or shower).   Eating chicken soup or other clear broths, and maintaining good nutrition.   Getting plenty of rest.   Using gargles or lozenges for comfort.   Controlling fevers with ibuprofen or acetaminophen as directed by your caregiver.   Increasing usage of your inhaler if you have asthma.  Zinc gel and zinc lozenges, taken in the first 24 hours of the common cold, can shorten the duration and lessen the severity  of symptoms. Pain medicines may help with fever, muscle aches, and throat pain. A variety of non-prescription medicines are available to treat congestion and runny nose. Your caregiver can make recommendations and may suggest nasal or lung inhalers for other symptoms.  HOME CARE INSTRUCTIONS   Only take over-the-counter or prescription medicines for  pain, discomfort, or fever as directed by your caregiver.   Use a warm mist humidifier or inhale steam from a shower to increase air moisture. This may keep secretions moist and make it easier to breathe.   Drink enough water and fluids to keep your urine clear or pale yellow.   Rest as needed.   Return to work when your temperature has returned to normal or as your caregiver advises. You may need to stay home longer to avoid infecting others. You can also use a face mask and careful hand washing to prevent spread of the virus.  SEEK MEDICAL CARE IF:   After the first few days, you feel you are getting worse rather than better.   You need your caregiver's advice about medicines to control symptoms.   You develop chills, worsening shortness of breath, or brown or red sputum. These may be signs of pneumonia.   You develop yellow or brown nasal discharge or pain in the face, especially when you bend forward. These may be signs of sinusitis.   You develop a fever, swollen neck glands, pain with swallowing, or white areas in the back of your throat. These may be signs of strep throat.  SEEK IMMEDIATE MEDICAL CARE IF:   You have a fever.   You develop severe or persistent headache, ear pain, sinus pain, or chest pain.   You develop wheezing, a prolonged cough, cough up blood, or have a change in your usual mucus (if you have chronic lung disease).   You develop sore muscles or a stiff neck.  Document Released: 09/04/2000 Document Revised: 02/28/2011 Document Reviewed: 07/13/2010 [facility] Patient Information 2012 [place], [place].

## 2011-07-23 ENCOUNTER — Telehealth: Payer: Self-pay | Admitting: *Deleted

## 2011-07-23 NOTE — Telephone Encounter (Signed)
Pt wants to know if he can get to his medical records online now, and if not when does Dr. Lovell Sheehan think this may be possible.?

## 2011-07-23 NOTE — Telephone Encounter (Signed)
Pt informed- time is unknown but we will let him know when it happens

## 2011-08-09 ENCOUNTER — Other Ambulatory Visit: Payer: Self-pay | Admitting: Internal Medicine

## 2011-09-10 ENCOUNTER — Ambulatory Visit: Payer: Medicare Other | Admitting: Internal Medicine

## 2011-09-24 ENCOUNTER — Other Ambulatory Visit: Payer: Self-pay | Admitting: Dermatology

## 2011-09-24 DIAGNOSIS — D239 Other benign neoplasm of skin, unspecified: Secondary | ICD-10-CM | POA: Diagnosis not present

## 2011-09-24 DIAGNOSIS — C44319 Basal cell carcinoma of skin of other parts of face: Secondary | ICD-10-CM | POA: Diagnosis not present

## 2011-09-24 DIAGNOSIS — L570 Actinic keratosis: Secondary | ICD-10-CM | POA: Diagnosis not present

## 2011-09-24 DIAGNOSIS — C4441 Basal cell carcinoma of skin of scalp and neck: Secondary | ICD-10-CM | POA: Diagnosis not present

## 2011-09-24 DIAGNOSIS — L821 Other seborrheic keratosis: Secondary | ICD-10-CM | POA: Diagnosis not present

## 2011-10-08 ENCOUNTER — Ambulatory Visit (INDEPENDENT_AMBULATORY_CARE_PROVIDER_SITE_OTHER): Payer: Medicare Other | Admitting: Internal Medicine

## 2011-10-08 VITALS — BP 136/80 | HR 76 | Temp 98.2°F | Resp 16 | Ht 69.0 in | Wt 180.0 lb

## 2011-10-08 DIAGNOSIS — E291 Testicular hypofunction: Secondary | ICD-10-CM | POA: Diagnosis not present

## 2011-10-08 DIAGNOSIS — N401 Enlarged prostate with lower urinary tract symptoms: Secondary | ICD-10-CM | POA: Diagnosis not present

## 2011-10-08 DIAGNOSIS — T887XXA Unspecified adverse effect of drug or medicament, initial encounter: Secondary | ICD-10-CM

## 2011-10-08 DIAGNOSIS — N138 Other obstructive and reflux uropathy: Secondary | ICD-10-CM | POA: Diagnosis not present

## 2011-10-08 DIAGNOSIS — N139 Obstructive and reflux uropathy, unspecified: Secondary | ICD-10-CM

## 2011-10-08 DIAGNOSIS — F439 Reaction to severe stress, unspecified: Secondary | ICD-10-CM

## 2011-10-08 DIAGNOSIS — R7309 Other abnormal glucose: Secondary | ICD-10-CM | POA: Diagnosis not present

## 2011-10-08 DIAGNOSIS — F430 Acute stress reaction: Secondary | ICD-10-CM | POA: Diagnosis not present

## 2011-10-08 DIAGNOSIS — M1990 Unspecified osteoarthritis, unspecified site: Secondary | ICD-10-CM

## 2011-10-08 MED ORDER — FOLIC ACID 1 MG PO TABS: 1.0000 mg | ORAL_TABLET | Freq: Every day | ORAL | Status: DC

## 2011-10-08 MED ORDER — LEVOTHYROXINE SODIUM 50 MCG PO TABS: 50.0000 ug | ORAL_TABLET | Freq: Every day | ORAL | Status: DC

## 2011-10-08 MED ORDER — FINASTERIDE 5 MG PO TABS: 5.0000 mg | ORAL_TABLET | Freq: Every day | ORAL | Status: DC

## 2011-10-08 MED ORDER — SERTRALINE HCL 25 MG PO TABS: 25.0000 mg | ORAL_TABLET | Freq: Every day | ORAL | Status: DC

## 2011-10-08 MED ORDER — DUTASTERIDE 0.5 MG PO CAPS: 0.5000 mg | ORAL_CAPSULE | Freq: Every day | ORAL | Status: DC

## 2011-10-08 MED ORDER — ALPRAZOLAM 0.25 MG PO TABS: 0.2500 mg | ORAL_TABLET | Freq: Three times a day (TID) | ORAL | Status: DC | PRN

## 2011-10-08 MED ORDER — TESTOSTERONE 30 MG/ACT TD SOLN: 1.0000 | Freq: Every day | TRANSDERMAL | Status: DC

## 2011-10-08 MED ORDER — SIMVASTATIN 20 MG PO TABS: 20.0000 mg | ORAL_TABLET | Freq: Every day | ORAL | Status: DC

## 2011-10-08 NOTE — Patient Instructions (Addendum)
The patient is instructed to continue all medications as prescribed. Schedule followup with check out clerk upon leaving the clinic  

## 2011-10-08 NOTE — Progress Notes (Signed)
Subjective:    Patient ID: Stephen Robbins, male    DOB: 07/02/1935, 76 y.o.   MRN: [id number]  HPI  Follow up medicatons Patient is also primary care for his wife is developing early Alzheimers He is dealing with stress and anxiety around this diagnoses.  His history of hypothyroidism male hypogonadism hyperlipidemia and prediabetes. He states it's hard to control his exercise and his diet when he is worried about his wife.  Review of Systems  Constitutional: Negative for fever and fatigue.  HENT: Negative for hearing loss, congestion, neck pain and postnasal drip.   Eyes: Negative for discharge, redness and visual disturbance.  Respiratory: Negative for cough, shortness of breath and wheezing.   Cardiovascular: Negative for leg swelling.  Gastrointestinal: Negative for abdominal pain, constipation and abdominal distention.  Genitourinary: Negative for urgency and frequency.  Musculoskeletal: Negative for joint swelling and arthralgias.  Skin: Negative for color change and rash.  Neurological: Negative for weakness and light-headedness.  Hematological: Negative for adenopathy.  Psychiatric/Behavioral: Negative for behavioral problems.   Past Medical History  Diagnosis Date  . Thyroid disease   . Hyperlipidemia   . Hemorrhoids   . Overactive bladder   . Diabetes mellitus   . Arthritis   . Low back pain   . Allergy     History   Social History  . Marital Status: Married    Spouse Name: N/A    Number of Children: N/A  . Years of Education: N/A   Occupational History  . retired    Social History Main Topics  . Smoking status: Former Smoker    Quit date: 03/25/1970  . Smokeless tobacco: Not on file  . Alcohol Use: Yes  . Drug Use: No  . Sexually Active: Yes   Other Topics Concern  . Not on file   Social History Narrative   Regular exercise    Past Surgical History  Procedure Date  . Index finger right thand   . Tonsillectomy   . Fx great toe   . Carpel  tunnel release left     Family History  Problem Relation Age of Onset  . Stroke    . Lung cancer    . Stroke Father     Allergies  Allergen Reactions  . Penicillins     REACTION: unspecified    Current Outpatient Prescriptions on File Prior to Visit  Medication Sig Dispense Refill  . aspirin 81 MG tablet Take 81 mg by mouth daily.        . B Complex Vitamins (B COMPLEX 50) TABS Take by mouth daily.        . Glucosamine-Chondroit-Vit C-Mn (GLUCOSAMINE CHONDR 500 COMPLEX) CAPS Take 1 capsule by mouth 2 (two) times daily.    0  . hydrocortisone 0.5 % cream Apply topically 2 (two) times daily as needed.        . hydrocortisone-pramoxine (ANALPRAM-HC) 2.5-1 % rectal cream Place rectally as directed.  30 g  6  . Niacin-Inositol (NIACIN FLUSH FREE) 400-100 MG CAPS Take 1 capsule by mouth 2 (two) times daily with a meal.  30 each  0  . Polyethyl Glycol-Propyl Glycol (SYSTANE ULTRA) 0.4-0.3 % SOLN Apply to eye as needed.        Marland Kitchen DISCONTD: simvastatin (ZOCOR) 20 MG tablet Take 1 tablet (20 mg total) by mouth at bedtime.  90 tablet  3  . DISCONTD: SYNTHROID 50 MCG tablet TAKE 1 TABLET DAILY  90 tablet  3  . DISCONTD:  Testosterone (AXIRON) 30 MG/ACT SOLN Place 1 Applicatorful onto the skin daily.  90 mL  5    BP 136/80  Pulse 76  Temp 98.2 F (36.8 C)  Resp 16  Ht 5\' 9"  (1.753 m)  Wt 180 lb (81.647 kg)  BMI 26.58 kg/m2        Objective:   Physical Exam  Nursing note and vitals reviewed. Constitutional: He appears well-developed and well-nourished.  HENT:  Head: Normocephalic and atraumatic.  Eyes: Conjunctivae are normal. Pupils are equal, round, and reactive to light.  Neck: Normal range of motion. Neck supple.  Cardiovascular: Normal rate and regular rhythm.   Pulmonary/Chest: Effort normal and breath sounds normal.  Abdominal: Soft. Bowel sounds are normal.          Assessment & Plan:  Increased stress around care for his wife with early Alzheimer's.  We  discussed strategies of care. Blood sugars have been moderately elevated weight loss and exercise are the primary intervention we discussed a gluten-free diet.  Blood pressure stable  Monitoring for hypothyroidism up to date  The patient has decided to discontinue his testosterone replacement due to the potential agitation it might add. We counseled the patient to monitor carefully his mood since the testosterone may actually help him cut with the situation. He states that sexual function is not an issue at this time.

## 2011-10-23 DIAGNOSIS — C44319 Basal cell carcinoma of skin of other parts of face: Secondary | ICD-10-CM | POA: Diagnosis not present

## 2011-11-11 ENCOUNTER — Telehealth: Payer: Self-pay | Admitting: Internal Medicine

## 2011-11-11 ENCOUNTER — Other Ambulatory Visit: Payer: Self-pay | Admitting: Internal Medicine

## 2011-11-11 DIAGNOSIS — H2510 Age-related nuclear cataract, unspecified eye: Secondary | ICD-10-CM | POA: Diagnosis not present

## 2011-11-11 DIAGNOSIS — J302 Other seasonal allergic rhinitis: Secondary | ICD-10-CM

## 2011-11-11 NOTE — Telephone Encounter (Signed)
Pt called and said that he seems to have continuous laryngitis. Req referral to specialist. Not Dr Sherene Sires.

## 2011-11-14 ENCOUNTER — Other Ambulatory Visit: Payer: Self-pay | Admitting: Internal Medicine

## 2011-11-29 ENCOUNTER — Other Ambulatory Visit: Payer: Self-pay | Admitting: Internal Medicine

## 2011-12-11 ENCOUNTER — Other Ambulatory Visit: Payer: Self-pay | Admitting: Internal Medicine

## 2011-12-17 ENCOUNTER — Encounter: Payer: Self-pay | Admitting: Internal Medicine

## 2011-12-25 ENCOUNTER — Telehealth: Payer: Self-pay | Admitting: Internal Medicine

## 2011-12-25 NOTE — Telephone Encounter (Signed)
Caller: [name]/Patient; Patient Name: Stephen Robbins; PCP: Darryll Capers (Adults only); Best Callback Phone Number: [phone]; Reason for call: Cough/Congestion.  Onset 12/18/11.  States he is concerned that he should be well recovered before getting his flu vaccine, as once before he had a reaction when he got his flu shot right after a cold.  Afebrile.  States his cough is mildly productive of yellowish sputum.  Per URI protocol, emergent symptoms denied; advised appointment within 24 hours.  Declines; states will monitor, as he feels he is improving; will call for appt for flu shot over next few days.

## 2011-12-25 NOTE — Telephone Encounter (Signed)
Dr jenkins agrees 

## 2011-12-27 ENCOUNTER — Ambulatory Visit (INDEPENDENT_AMBULATORY_CARE_PROVIDER_SITE_OTHER)
Admission: RE | Admit: 2011-12-27 | Discharge: 2011-12-27 | Disposition: A | Discharge status: Home / Self Care | Payer: Medicare Other | Source: Ambulatory Visit | Attending: Internal Medicine | Admitting: Internal Medicine

## 2011-12-27 ENCOUNTER — Encounter: Payer: Self-pay | Admitting: Internal Medicine

## 2011-12-27 ENCOUNTER — Ambulatory Visit (INDEPENDENT_AMBULATORY_CARE_PROVIDER_SITE_OTHER): Payer: Medicare Other | Admitting: Internal Medicine

## 2011-12-27 VITALS — BP 128/72 | HR 79 | Ht 69.0 in | Wt 181.6 lb

## 2011-12-27 DIAGNOSIS — R059 Cough, unspecified: Secondary | ICD-10-CM

## 2011-12-27 DIAGNOSIS — R05 Cough: Secondary | ICD-10-CM | POA: Diagnosis not present

## 2011-12-27 DIAGNOSIS — R490 Dysphonia: Secondary | ICD-10-CM | POA: Diagnosis not present

## 2011-12-27 DIAGNOSIS — J309 Allergic rhinitis, unspecified: Secondary | ICD-10-CM | POA: Diagnosis not present

## 2011-12-27 NOTE — Patient Instructions (Addendum)
Order- speech therapy assisted modified barium swallow        Dx hoarseness and cough             CXR      Dx cough              ENT referral  Dr Hermelinda Medicus   Dx hoarseness  Sample Qnasl nasal spray  1-2 puffs each nostril once daily at bedtime

## 2011-12-27 NOTE — Progress Notes (Signed)
12/27/11- 78 yoM former smoker referred courtesy of Dr Lovell Sheehan, concerned about weak voice and cough. He had come here when his wife was a new patient recently. He had smoked in [employer] but quit in 1970. He complains that for several years his voice will get weak easily while talking. Occasional cough has been relieved with lemon drops. He and his wife came back from a visit to [place] with a cold and coughing. Otherwise cough is occasional. Using saline solution by squeeze bottle for nasal rinse. Aware of postnasal drip but never noticed seasonal rhinitis. He had fractured his nose and had a septoplasty in 2008. He denies choking or strangling with meal or drink, choking during sleep or any history of lung disease or asthma. Triggers for cough have included mildew. He has had plastic sheath and put down in his crawl space but he is not actually know that there is a mold problem in his home. His parents smoked when mother died of lung cancer.  Prior to Admission medications   Medication Sig Start Date End Date Taking? Authorizing Provider  ALPRAZolam (XANAX) 0.25 MG tablet Take 1 tablet (0.25 mg total) by mouth 3 (three) times daily as needed for sleep. 10/08/11  Yes Stacie Glaze, MD  aspirin 81 MG tablet Take 81 mg by mouth daily.     Yes Historical Provider, MD  B Complex Vitamins (B COMPLEX 50) TABS Take by mouth daily.     Yes Historical Provider, MD  finasteride (PROSCAR) 5 MG tablet Take 1 tablet (5 mg total) by mouth daily. 10/08/11  Yes Stacie Glaze, MD  folic acid (FOLVITE) 1 MG tablet Take 1 tablet (1 mg total) by mouth daily. 10/08/11  Yes Stacie Glaze, MD  Glucosamine-Chondroit-Vit C-Mn (GLUCOSAMINE CHONDR 500 COMPLEX) CAPS Take 1 capsule by mouth 2 (two) times daily. 08/02/10  Yes Stacie Glaze, MD  hydrocortisone 0.5 % cream Apply topically 2 (two) times daily as needed.     Yes Historical Provider, MD  hydrocortisone-pramoxine [facility]) 2.5-1 % rectal cream Place  rectally as directed. 02/27/11  Yes Stacie Glaze, MD  levothyroxine (SYNTHROID) 50 MCG tablet Take 1 tablet (50 mcg total) by mouth daily. 10/08/11  Yes Stacie Glaze, MD  Niacin-Inositol (NIACIN FLUSH FREE) 400-100 MG CAPS Take 1 capsule by mouth 2 (two) times daily with a meal. 02/27/11  Yes Stacie Glaze, MD  Polyethyl Glycol-Propyl Glycol (SYSTANE ULTRA) 0.4-0.3 % SOLN Apply to eye as needed.     Yes Historical Provider, MD  sertraline (ZOLOFT) 25 MG tablet Take 1 tablet (25 mg total) by mouth daily. 10/08/11 10/07/12 Yes Stacie Glaze, MD  simvastatin (ZOCOR) 20 MG tablet TAKE 1 TABLET EACH EVENING 12/11/11  Yes Stacie Glaze, MD  AXIRON 30 MG/ACT SOLN apply 1 APPLICATION ONTO SKIN DAILY 12/29/11   Stacie Glaze, MD   Past Medical History  Diagnosis Date  . Thyroid disease   . Hyperlipidemia   . Hemorrhoids   . Overactive bladder   . Diabetes mellitus   . Arthritis   . Low back pain   . Allergy    Past Surgical History  Procedure Date  . Index finger right thand   . Tonsillectomy   . Fx great toe   . Carpel tunnel release left    Family History  Problem Relation Age of Onset  . Stroke    . Lung cancer    . Stroke Father  History   Social History  . Marital Status: Married    Spouse Name: N/A    Number of Children: 0  . Years of Education: N/A   Occupational History  . retired    Social History Main Topics  . Smoking status: Former Smoker    Quit date: 03/25/1970  . Smokeless tobacco: Not on file  . Alcohol Use: Yes     1 glass wine daily  . Drug Use: No  . Sexually Active: Yes   Other Topics Concern  . Not on file   Social History Narrative   Regular exercise    ROS-see HPI Constitutional:   No-   weight loss, night sweats, fevers, chills, fatigue, lassitude. HEENT:   No-  headaches, difficulty swallowing, tooth/dental problems, sore throat,       No-  sneezing, itching, ear ache, nasal congestion, post nasal drip,  CV:  No-   chest pain,  orthopnea, PND, swelling in lower extremities, anasarca, dizziness, palpitations Resp: No-   shortness of breath with exertion or at rest.              No-   productive cough,  + non-productive cough,  No- coughing up of blood.              No-   change in color of mucus.  No- wheezing.   Skin: No-   rash or lesions. GI:  No-   heartburn, indigestion, abdominal pain, nausea, vomiting, diarrhea,                 change in bowel habits, loss of appetite GU: No-   dysuria, change in color of urine, no urgency or frequency.  No- flank pain. MS:  No-   joint pain or swelling.  No- decreased range of motion.  No- back pain. Neuro-     nothing unusual Psych:  No- change in mood or affect. No depression or anxiety.  No memory loss.  OBJ- Physical Exam General- Alert, Oriented, Affect-appropriate, Distress- none acute Skin- rash-none, lesions- none, excoriation- none Lymphadenopathy- none Head- atraumatic            Eyes- Gross vision intact, PERRLA, conjunctivae and secretions clear            Ears- Hearing, canals-normal            Nose- Clear, + deviated external nose and +Septal dev, mucus, polyps, erosion, perforation             Throat- Mallampati II , mucosa clear , drainage- none, tonsils- atrophic. Voice quality is normal. Neck- flexible , trachea midline, no stridor , thyroid nl, carotid no bruit Chest - symmetrical excursion , unlabored           Heart/CV- RRR , no murmur , no gallop  , no rub, nl s1 s2                           - JVD- none , edema- none, stasis changes- none, varices- none           Lung- + very slight rattle, wheeze- none, + recurrent dry cough , dullness-none, rub- none           Chest wall-  Abd- tender-no, distended-no, bowel sounds-present, HSM- no Br/ Gen/ Rectal- Not done, not indicated Extrem- cyanosis- none, clubbing, none, atrophy- none, strength- nl Neuro- grossly intact to observation

## 2011-12-29 ENCOUNTER — Other Ambulatory Visit: Payer: Self-pay | Admitting: Internal Medicine

## 2011-12-30 ENCOUNTER — Telehealth: Payer: Self-pay | Admitting: Internal Medicine

## 2011-12-30 ENCOUNTER — Other Ambulatory Visit: Payer: Self-pay | Admitting: *Deleted

## 2011-12-30 DIAGNOSIS — R490 Dysphonia: Secondary | ICD-10-CM

## 2011-12-30 NOTE — Telephone Encounter (Signed)
ATC pt at # provided above -- line busy x 3. WCB

## 2011-12-30 NOTE — Telephone Encounter (Signed)
Pt returned call from triage. Kathleen W Perdue °

## 2011-12-30 NOTE — Telephone Encounter (Signed)
Pt remembered that he had surgery by Dr Narda Bonds in 2009 for deviated septum and wanted to know if Dr Maple Hudson would mind him seeing Dr Ezzard Standing instead of Dr Haroldine Laws.  Okay to leave a detailed message.  Please advise.

## 2011-12-30 NOTE — Telephone Encounter (Signed)
Order placed to cancel appt with Dr. Haroldine Laws and set up an appt with Dr. Narda Bonds.  Left msg with family member to have him call back to inform him of this.

## 2011-12-30 NOTE — Telephone Encounter (Signed)
Called, spoke with pt.  Informed him of below per Dr. Maple Hudson.  He verbalized understanding and already has an appt scheduled to see Dr. Benancio Deeds on Oct 21.    Note:  Pt states it is Dr. Benancio Deeds not Dr. Ezzard Standing

## 2011-12-30 NOTE — Telephone Encounter (Signed)
That is appropriate. I want him to go to the one who knows him. Please cancel referral to Dr Haroldine Laws and order new referral to Dr Ezzard Standing

## 2012-01-02 ENCOUNTER — Ambulatory Visit (HOSPITAL_COMMUNITY)
Admission: RE | Admit: 2012-01-02 | Discharge: 2012-01-02 | Disposition: A | Discharge status: Home / Self Care | Payer: Medicare Other | Source: Ambulatory Visit | Attending: Internal Medicine | Admitting: Internal Medicine

## 2012-01-02 DIAGNOSIS — R059 Cough, unspecified: Secondary | ICD-10-CM | POA: Diagnosis not present

## 2012-01-02 DIAGNOSIS — R05 Cough: Secondary | ICD-10-CM | POA: Insufficient documentation

## 2012-01-02 DIAGNOSIS — R1310 Dysphagia, unspecified: Secondary | ICD-10-CM | POA: Diagnosis not present

## 2012-01-02 DIAGNOSIS — R490 Dysphonia: Secondary | ICD-10-CM | POA: Diagnosis not present

## 2012-01-02 NOTE — Procedures (Signed)
Objective Swallowing Evaluation: Modified Barium Swallowing Study  Patient Details  Name: Stephen Robbins MRN: [id number] Date of Birth: July 04, 1935  Today's Date: 01/02/2012 Time: 1200-1240 SLP Time Calculation (min): 40 min  Past Medical History:  Past Medical History  Diagnosis Date  . Thyroid disease   . Hyperlipidemia   . Hemorrhoids   . Overactive bladder   . Diabetes mellitus   . Arthritis   . Low back pain   . Allergy    Past Surgical History:  Past Surgical History  Procedure Date  . Index finger right thand   . Tonsillectomy   . Fx great toe   . Carpel tunnel release left    HPI:  76 y.o. male referred for OP MBS.  Reports chronic cough of unknown etiology.  Pt attributes symptoms to "reflux cough."  He does not report increased coughing associated with PO intake.  Surgery for deviated septum 2009.  Reports appt in later October with Dr. Ezzard Robbins, ENT.       Assessment / Plan / Recommendation Clinical Impression  Dysphagia Diagnosis: Within Functional Limits;Suspected primary esophageal dysphagia  Clinical impression: Pt presents with normal oropharyngeal function, marked only by intermittent penetration of thin liquids.  Brief scans of the esophagus demonstrated a prominent CP, as well as brief stasis of more viscous materials in thoracic esophagus and occasional backflow within the esophagus.  Pt reports no medical dx of GERD; he occasionally takes Immodium.  Pt may benefit from esophageal assessment and management.             Diet Recommendation Regular;Thin liquid   Liquid Administration via: Cup;Straw Medication Administration: Whole meds with liquid Supervision: Patient able to self feed    Other  Recommendations Recommended Consults: Consider esophageal assessment   Stephen Robbins, [place] CCC/SLP Pager [phone]   Stephen Robbins 01/02/2012, 2:40 PM

## 2012-01-04 DIAGNOSIS — R059 Cough, unspecified: Secondary | ICD-10-CM | POA: Insufficient documentation

## 2012-01-04 DIAGNOSIS — R05 Cough: Secondary | ICD-10-CM | POA: Insufficient documentation

## 2012-01-04 NOTE — Assessment & Plan Note (Signed)
Plan-in talking with him, he is focused on this and detail oriented. We will ask ENT visualization of his hypopharynx and larynx. Also scheduled modified barium swallow/speech therapy looking for low-level aspiration. Chest x-ray

## 2012-01-04 NOTE — Assessment & Plan Note (Addendum)
He doesn't give  obvious history for allergic rhinitis or for nasal/sinus symptoms. Plan-trial sample Qnasl to assess effect

## 2012-01-07 ENCOUNTER — Ambulatory Visit (INDEPENDENT_AMBULATORY_CARE_PROVIDER_SITE_OTHER): Payer: Medicare Other | Admitting: Internal Medicine

## 2012-01-07 DIAGNOSIS — Z23 Encounter for immunization: Secondary | ICD-10-CM

## 2012-01-08 ENCOUNTER — Ambulatory Visit: Payer: Medicare Other | Admitting: Internal Medicine

## 2012-01-13 DIAGNOSIS — R490 Dysphonia: Secondary | ICD-10-CM | POA: Diagnosis not present

## 2012-01-21 ENCOUNTER — Telehealth: Payer: Self-pay | Admitting: Internal Medicine

## 2012-01-21 NOTE — Telephone Encounter (Signed)
Modified barium swallow- actual swallowing mechanics were within normal. Once in the esophagus, there was some hesitation delaying passage through at times. This may indicate a little loss of normal muscle coordination in the esophagus, possibly with normal aging. If there is more concern, then GI referral could be made. Can discuss on office f/u.  I spoke with patient about results and he verbalized understanding and had no questions

## 2012-01-21 NOTE — Progress Notes (Signed)
Quick Note:  LMTCB ______ 

## 2012-01-27 ENCOUNTER — Ambulatory Visit (INDEPENDENT_AMBULATORY_CARE_PROVIDER_SITE_OTHER): Payer: Medicare Other | Admitting: Internal Medicine

## 2012-01-27 ENCOUNTER — Encounter: Payer: Self-pay | Admitting: Internal Medicine

## 2012-01-27 VITALS — BP 140/80 | HR 72 | Temp 98.2°F | Resp 16 | Ht 69.0 in | Wt 177.0 lb

## 2012-01-27 DIAGNOSIS — F329 Major depressive disorder, single episode, unspecified: Secondary | ICD-10-CM | POA: Diagnosis not present

## 2012-01-27 DIAGNOSIS — N400 Enlarged prostate without lower urinary tract symptoms: Secondary | ICD-10-CM

## 2012-01-27 DIAGNOSIS — E785 Hyperlipidemia, unspecified: Secondary | ICD-10-CM | POA: Diagnosis not present

## 2012-01-27 DIAGNOSIS — R05 Cough: Secondary | ICD-10-CM

## 2012-01-27 DIAGNOSIS — E039 Hypothyroidism, unspecified: Secondary | ICD-10-CM

## 2012-01-27 DIAGNOSIS — R059 Cough, unspecified: Secondary | ICD-10-CM

## 2012-01-27 MED ORDER — BENZONATATE 100 MG PO CAPS: 100.0000 mg | ORAL_CAPSULE | Freq: Three times a day (TID) | ORAL | Status: DC | PRN

## 2012-01-27 MED ORDER — DUTASTERIDE 0.5 MG PO CAPS: 0.5000 mg | ORAL_CAPSULE | Freq: Every day | ORAL | Status: DC

## 2012-01-27 NOTE — Progress Notes (Signed)
Subjective:    Patient ID: Stephen Robbins, male    DOB: 1936/03/19, 76 y.o.   MRN: [id number]  HPI Has a dry cough. The qnasl initially helped but now he has a "cold and increased cough" Follow up labs: stable hypothyroidism Did not start the the sertaline. He did not start the avodart      Review of Systems  Constitutional: Negative for fever and fatigue.  HENT: Negative for hearing loss, congestion, neck pain and postnasal drip.   Eyes: Negative for discharge, redness and visual disturbance.  Respiratory: Positive for cough. Negative for shortness of breath and wheezing.   Cardiovascular: Negative for leg swelling.  Gastrointestinal: Negative for abdominal pain, constipation and abdominal distention.  Genitourinary: Positive for urgency and frequency.  Musculoskeletal: Negative for joint swelling and arthralgias.  Skin: Negative for color change and rash.  Neurological: Negative for weakness and light-headedness.  Hematological: Negative for adenopathy.  Psychiatric/Behavioral: Negative for behavioral problems.   Past Medical History  Diagnosis Date  . Thyroid disease   . Hyperlipidemia   . Hemorrhoids   . Overactive bladder   . Diabetes mellitus   . Arthritis   . Low back pain   . Allergy     History   Social History  . Marital Status: Married    Spouse Name: N/A    Number of Children: 0  . Years of Education: N/A   Occupational History  . retired    Social History Main Topics  . Smoking status: Former Smoker    Quit date: 03/25/1970  . Smokeless tobacco: Not on file  . Alcohol Use: Yes     Comment: 1 glass wine daily  . Drug Use: No  . Sexually Active: Yes   Other Topics Concern  . Not on file   Social History Narrative   Regular exercise    Past Surgical History  Procedure Date  . Index finger right thand   . Tonsillectomy   . Fx great toe   . Carpel tunnel release left     Family History  Problem Relation Age of Onset  . Stroke    .  Lung cancer    . Stroke Father     Allergies  Allergen Reactions  . Penicillins     REACTION: unspecified    Current Outpatient Prescriptions on File Prior to Visit  Medication Sig Dispense Refill  . ALPRAZolam (XANAX) 0.25 MG tablet Take 1 tablet (0.25 mg total) by mouth 3 (three) times daily as needed for sleep.  60 tablet  5  . aspirin 81 MG tablet Take 81 mg by mouth daily.        Randa Ngo 30 MG/ACT SOLN apply 1 APPLICATION ONTO SKIN DAILY  90 mL  5  . B Complex Vitamins (B COMPLEX 50) TABS Take by mouth daily.        . finasteride (PROSCAR) 5 MG tablet Take 1 tablet (5 mg total) by mouth daily.  90 tablet  3  . folic acid (FOLVITE) 1 MG tablet Take 1 tablet (1 mg total) by mouth daily.  90 tablet  3  . Glucosamine-Chondroit-Vit C-Mn (GLUCOSAMINE CHONDR 500 COMPLEX) CAPS Take 1 capsule by mouth 2 (two) times daily.    0  . hydrocortisone 0.5 % cream Apply topically 2 (two) times daily as needed.        . hydrocortisone-pramoxine (ANALPRAM-HC) 2.5-1 % rectal cream Place rectally as directed.  30 g  6  . levothyroxine (SYNTHROID) 50 MCG  tablet Take 1 tablet (50 mcg total) by mouth daily.  90 tablet  3  . Niacin-Inositol (NIACIN FLUSH FREE) 400-100 MG CAPS Take 1 capsule by mouth 2 (two) times daily with a meal.  30 each  0  . Polyethyl Glycol-Propyl Glycol (SYSTANE ULTRA) 0.4-0.3 % SOLN Apply to eye as needed.        . simvastatin (ZOCOR) 20 MG tablet TAKE 1 TABLET EACH EVENING  90 tablet  3  . sertraline (ZOLOFT) 25 MG tablet Take 1 tablet (25 mg total) by mouth daily.  90 tablet  3    BP 140/80  Pulse 72  Temp 98.2 F (36.8 C)  Resp 16  Ht 5\' 9"  (1.753 m)  Wt 177 lb (80.287 kg)  BMI 26.14 kg/m2       Objective:   Physical Exam  Nursing note and vitals reviewed. Constitutional: He appears well-developed and well-nourished.  HENT:  Head: Normocephalic and atraumatic.  Eyes: Conjunctivae normal are normal. Pupils are equal, round, and reactive to light.  Neck: Normal  range of motion. Neck supple.  Cardiovascular: Normal rate and regular rhythm.   Murmur heard. Pulmonary/Chest: Effort normal and breath sounds normal.  Abdominal: Soft. Bowel sounds are normal.          Assessment & Plan:  Patient has an adversion for medications and did not start the interventions for mood and for BPH with symptoms. We reviewed the indications for proscar or avodart. Discussed the need for zoloft.given his wifes progressive condition

## 2012-01-27 NOTE — Patient Instructions (Addendum)
The patient is instructed to continue all medications as prescribed. Schedule followup with check out clerk upon leaving the clinic Start  avodart

## 2012-01-29 ENCOUNTER — Other Ambulatory Visit: Payer: Self-pay | Admitting: Internal Medicine

## 2012-02-13 ENCOUNTER — Encounter: Payer: Self-pay | Admitting: Internal Medicine

## 2012-02-13 ENCOUNTER — Ambulatory Visit (INDEPENDENT_AMBULATORY_CARE_PROVIDER_SITE_OTHER): Payer: Medicare Other | Admitting: Internal Medicine

## 2012-02-13 ENCOUNTER — Other Ambulatory Visit: Payer: Self-pay | Admitting: Internal Medicine

## 2012-02-13 VITALS — BP 130/70 | HR 64 | Ht 69.0 in | Wt 176.0 lb

## 2012-02-13 DIAGNOSIS — J3089 Other allergic rhinitis: Secondary | ICD-10-CM

## 2012-02-13 DIAGNOSIS — R05 Cough: Secondary | ICD-10-CM

## 2012-02-13 DIAGNOSIS — R059 Cough, unspecified: Secondary | ICD-10-CM

## 2012-02-13 DIAGNOSIS — J302 Other seasonal allergic rhinitis: Secondary | ICD-10-CM

## 2012-02-13 DIAGNOSIS — J309 Allergic rhinitis, unspecified: Secondary | ICD-10-CM | POA: Diagnosis not present

## 2012-02-13 DIAGNOSIS — J45909 Unspecified asthma, uncomplicated: Secondary | ICD-10-CM

## 2012-02-13 MED ORDER — BECLOMETHASONE DIPROPIONATE 80 MCG/ACT NA AERS: 1.0000 | INHALATION_SPRAY | NASAL | Status: DC

## 2012-02-13 MED ORDER — BECLOMETHASONE DIPROPIONATE 80 MCG/ACT NA AERS: 1.0000 | INHALATION_SPRAY | Freq: Once | NASAL | Status: DC

## 2012-02-13 NOTE — Progress Notes (Signed)
12/27/11- 77 yoM former smoker referred courtesy of Dr Lovell Sheehan, concerned about weak voice and cough. He had come here when his wife was a new patient recently. He had smoked in [employer] but quit in 1970. He complains that for several years his voice will get weak easily while talking. Occasional cough has been relieved with lemon drops. He and his wife came back from a visit to [place] with a cold and coughing. Otherwise cough is occasional. Using saline solution by squeeze bottle for nasal rinse. Aware of postnasal drip but never noticed seasonal rhinitis. He had fractured his nose and had a septoplasty in 2008. He denies choking or strangling with meal or drink, choking during sleep or any history of lung disease or asthma. Triggers for cough have included mildew. He has had plastic sheath and put down in his crawl space but he is not actually know that there is a mold problem in his home. His parents smoked when mother died of lung cancer.  03-12-12- 27 yoM former smoker referred courtesy of Dr Lovell Sheehan, concerned about weak voice and cough. FOLLOWS ZOX:WRUEA having tickle in throat area. No longer having a cough like before; Refill Qnasl             wife here Cough is much better. Still complains of "weak voice". Has had flu vaccine. Swallowing eval on October 29 showed some hesitation. Has prescription from Dr. Ezzard Standing for Nexium and I discussed use. Suggested hard candies as a throat lozenge if needed. CXR 01/01/12-reviewed IMPRESSION:  Minimal central peribronchial thickening is present. This may be  associated with bronchitis, asthma, and reactive airway disease.  No peripheral infiltrate consolidation is evident. Chronic  findings are detailed above.  Original Report Authenticated By: Crawford Givens, M.D.   ROS-see HPI Constitutional:   No-   weight loss, night sweats, fevers, chills, fatigue, lassitude. HEENT:   No-  headaches, difficulty swallowing, tooth/dental problems, sore  throat,       No-  sneezing, itching, ear ache, nasal congestion, post nasal drip,  CV:  No-   chest pain, orthopnea, PND, swelling in lower extremities, anasarca, dizziness, palpitations Resp: No-   shortness of breath with exertion or at rest.              No-   productive cough,  + non-productive cough,  No- coughing up of blood.              No-   change in color of mucus.  No- wheezing.   Skin: No-   rash or lesions. GI:  No-   heartburn, indigestion, abdominal pain, nausea, vomiting,  GU: . MS:  No-   joint pain or swelling.  . Neuro-     nothing unusual Psych:  No- change in mood or affect. No depression or anxiety.  No memory loss.  OBJ- Physical Exam General- Alert, Oriented, Affect-appropriate, Distress- none acute Skin- rash-none, lesions- none, excoriation- none Lymphadenopathy- none Head- atraumatic            Eyes- Gross vision intact, PERRLA, conjunctivae and secretions clear            Ears- Hearing, canals-normal            Nose- Clear, + deviated external nose and +Septal dev, mucus, polyps, erosion, perforation             Throat- Mallampati II , mucosa clear , drainage- none, tonsils- atrophic. Voice quality is normal. Neck- flexible , trachea  midline, no stridor , thyroid nl, carotid no bruit Chest - symmetrical excursion , unlabored           Heart/CV- RRR , no murmur , no gallop  , no rub, nl s1 s2                           - JVD- none , edema- none, stasis changes- none, varices- none           Lung-  wheeze- none, + minor dry cough with deep breath , dullness-none, rub- none           Chest wall-  Abd-  Br/ Gen/ Rectal- Not done, not indicated Extrem- cyanosis- none, clubbing, none, atrophy- none, strength- nl Neuro- grossly intact to observation

## 2012-02-13 NOTE — Patient Instructions (Addendum)
Script for Qnasl to resume if needed  Please call as needed

## 2012-02-20 ENCOUNTER — Other Ambulatory Visit: Payer: Self-pay | Admitting: Internal Medicine

## 2012-02-25 DIAGNOSIS — J45909 Unspecified asthma, uncomplicated: Secondary | ICD-10-CM | POA: Insufficient documentation

## 2012-02-25 NOTE — Assessment & Plan Note (Signed)
Watch for clinical importance of reflux, noting swallowing evaluation of 01/21/2012.

## 2012-02-25 NOTE — Assessment & Plan Note (Signed)
Mild chronic bronchitis pattern. We're going to watch conservatively.

## 2012-02-25 NOTE — Assessment & Plan Note (Signed)
Qnasl seems to work if used fairly regularly. I discussed appropriate expectations and coordination with occasional antihistamine.

## 2012-04-28 ENCOUNTER — Other Ambulatory Visit: Payer: Self-pay | Admitting: Dermatology

## 2012-04-28 DIAGNOSIS — D1801 Hemangioma of skin and subcutaneous tissue: Secondary | ICD-10-CM | POA: Diagnosis not present

## 2012-04-28 DIAGNOSIS — D485 Neoplasm of uncertain behavior of skin: Secondary | ICD-10-CM | POA: Diagnosis not present

## 2012-04-28 DIAGNOSIS — L570 Actinic keratosis: Secondary | ICD-10-CM | POA: Diagnosis not present

## 2012-04-28 DIAGNOSIS — L723 Sebaceous cyst: Secondary | ICD-10-CM | POA: Diagnosis not present

## 2012-04-28 DIAGNOSIS — C4441 Basal cell carcinoma of skin of scalp and neck: Secondary | ICD-10-CM | POA: Diagnosis not present

## 2012-04-28 DIAGNOSIS — Z85828 Personal history of other malignant neoplasm of skin: Secondary | ICD-10-CM | POA: Diagnosis not present

## 2012-04-28 DIAGNOSIS — L821 Other seborrheic keratosis: Secondary | ICD-10-CM | POA: Diagnosis not present

## 2012-05-15 ENCOUNTER — Encounter (INDEPENDENT_AMBULATORY_CARE_PROVIDER_SITE_OTHER): Payer: Medicare Other

## 2012-05-15 DIAGNOSIS — I6529 Occlusion and stenosis of unspecified carotid artery: Secondary | ICD-10-CM

## 2012-05-25 ENCOUNTER — Encounter: Payer: Self-pay | Admitting: Internal Medicine

## 2012-05-25 ENCOUNTER — Ambulatory Visit (INDEPENDENT_AMBULATORY_CARE_PROVIDER_SITE_OTHER): Payer: Medicare Other | Admitting: Internal Medicine

## 2012-05-25 VITALS — BP 136/80 | HR 68 | Temp 98.2°F | Resp 16 | Ht 69.0 in | Wt 173.0 lb

## 2012-05-25 DIAGNOSIS — IMO0001 Reserved for inherently not codable concepts without codable children: Secondary | ICD-10-CM

## 2012-05-25 DIAGNOSIS — N401 Enlarged prostate with lower urinary tract symptoms: Secondary | ICD-10-CM

## 2012-05-25 DIAGNOSIS — N138 Other obstructive and reflux uropathy: Secondary | ICD-10-CM | POA: Diagnosis not present

## 2012-05-25 DIAGNOSIS — R339 Retention of urine, unspecified: Secondary | ICD-10-CM | POA: Diagnosis not present

## 2012-05-25 DIAGNOSIS — I10 Essential (primary) hypertension: Secondary | ICD-10-CM

## 2012-05-25 LAB — HEMOGLOBIN A1C: Hgb A1c MFr Bld: 9.4 % — ABNORMAL HIGH (ref 4.6–6.5)

## 2012-05-25 LAB — BASIC METABOLIC PANEL
BUN: 11 mg/dL (ref 6–23)
CO2: 27 mEq/L (ref 19–32)
Calcium: 9.5 mg/dL (ref 8.4–10.5)
Chloride: 99 mEq/L (ref 96–112)
Creatinine, Ser: 0.7 mg/dL (ref 0.4–1.5)
GFR: 113.16 mL/min (ref >60.00)
Glucose, Bld: 347 mg/dL — ABNORMAL HIGH (ref 70–99)
Potassium: 4.8 mEq/L (ref 3.5–5.1)
Sodium: 134 mEq/L — ABNORMAL LOW (ref 135–145)

## 2012-05-25 MED ORDER — FINASTERIDE 5 MG PO TABS: 5.0000 mg | ORAL_TABLET | Freq: Every day | ORAL | Status: DC

## 2012-05-25 MED ORDER — TAMSULOSIN HCL 0.4 MG PO CAPS: 0.4000 mg | ORAL_CAPSULE | Freq: Every day | ORAL | Status: DC

## 2012-05-25 NOTE — Patient Instructions (Signed)
The patient is instructed to continue all medications as prescribed. Schedule followup with check out clerk upon leaving the clinic  

## 2012-05-25 NOTE — Progress Notes (Signed)
Subjective:    Patient ID: Stephen Robbins, male    DOB: May 30, 1935, 77 y.o.   MRN: [id number]  HPI Follow lipids and thyroid Labs due Concerned about increased frequency constipation and loose stools pattern On avodart for prostate Diet  gym    Review of Systems  Constitutional: Negative for fever and fatigue.  HENT: Negative for hearing loss, congestion, neck pain and postnasal drip.   Eyes: Negative for discharge, redness and visual disturbance.  Respiratory: Negative for cough, shortness of breath and wheezing.   Cardiovascular: Negative for leg swelling.  Gastrointestinal: Negative for abdominal pain, constipation and abdominal distention.  Genitourinary: Negative for urgency and frequency.  Musculoskeletal: Negative for joint swelling and arthralgias.  Skin: Negative for color change and rash.  Neurological: Negative for weakness and light-headedness.  Hematological: Negative for adenopathy.  Psychiatric/Behavioral: Negative for behavioral problems.   Past Medical History  Diagnosis Date  . Thyroid disease   . Hyperlipidemia   . Hemorrhoids   . Overactive bladder   . Arthritis   . Low back pain   . Allergy     History   Social History  . Marital Status: Married    Spouse Name: N/A    Number of Children: 0  . Years of Education: N/A   Occupational History  . retired    Social History Main Topics  . Smoking status: Former Smoker    Quit date: 03/25/1970  . Smokeless tobacco: Not on file  . Alcohol Use: Yes     Comment: 1 glass wine daily  . Drug Use: No  . Sexually Active: Yes   Other Topics Concern  . Not on file   Social History Narrative   Regular exercise          Past Surgical History  Procedure Laterality Date  . Index finger right thand    . Tonsillectomy    . Fx great toe    . Carpel tunnel release left      Family History  Problem Relation Age of Onset  . Stroke Father   . Lung cancer Mother     Allergies  Allergen  Reactions  . Penicillins     REACTION: unspecified    Current Outpatient Prescriptions on File Prior to Visit  Medication Sig Dispense Refill  . ALPRAZolam (XANAX) 0.25 MG tablet take 1 tablet by mouth three times a day if needed for anxiety  60 tablet  5  . aspirin 81 MG tablet Take 81 mg by mouth daily.        Randa Ngo 30 MG/ACT SOLN apply 1 APPLICATION ONTO SKIN DAILY  90 mL  5  . B Complex Vitamins (B COMPLEX 50) TABS Take by mouth daily.        . benzonatate (TESSALON) 100 MG capsule take 1 capsule by mouth three times a day if needed for cough  30 capsule  1  . dutasteride (AVODART) 0.5 MG capsule Take 1 capsule (0.5 mg total) by mouth daily.  30 capsule  11  . finasteride (PROSCAR) 5 MG tablet Take 1 tablet by mouth daily.      . folic acid (FOLVITE) 1 MG tablet Take 1 tablet (1 mg total) by mouth daily.  90 tablet  3  . Glucosamine-Chondroit-Vit C-Mn (GLUCOSAMINE CHONDR 500 COMPLEX) CAPS Take 1 capsule by mouth 2 (two) times daily.    0  . hydrocortisone 0.5 % cream Apply topically 2 (two) times daily as needed.        Marland Kitchen  hydrocortisone-pramoxine (ANALPRAM-HC) 2.5-1 % rectal cream Place rectally as directed.  30 g  6  . levothyroxine (SYNTHROID) 50 MCG tablet Take 1 tablet (50 mcg total) by mouth daily.  90 tablet  3  . Niacin-Inositol (NIACIN FLUSH FREE) 400-100 MG CAPS Take 1 capsule by mouth 2 (two) times daily with a meal.  30 each  0  . Polyethyl Glycol-Propyl Glycol (SYSTANE ULTRA) 0.4-0.3 % SOLN Apply to eye as needed.        . sertraline (ZOLOFT) 25 MG tablet Take 1 tablet (25 mg total) by mouth daily.  90 tablet  3  . simvastatin (ZOCOR) 20 MG tablet TAKE 1 TABLET EACH EVENING  90 tablet  3   No current facility-administered medications on file prior to visit.    BP 136/80  Pulse 68  Temp(Src) 98.2 F (36.8 C)  Resp 16  Ht 5\' 9"  (1.753 m)  Wt 173 lb (78.472 kg)  BMI 25.54 kg/m2       Objective:   Physical Exam  Nursing note and vitals  reviewed. Constitutional: He appears well-developed and well-nourished.  HENT:  Head: Normocephalic and atraumatic.  Eyes: Conjunctivae are normal. Pupils are equal, round, and reactive to light.  Neck: Normal range of motion. Neck supple.  Cardiovascular: Normal rate and regular rhythm.   Pulmonary/Chest: Effort normal and breath sounds normal.  Abdominal: Soft. Bowel sounds are normal.          Assessment & Plan:  IBS Fatigue Reviewed napping and fatigue pattern Increased BPH symptoms Change to jalyn

## 2012-06-05 ENCOUNTER — Encounter: Payer: Self-pay | Admitting: Internal Medicine

## 2012-06-09 ENCOUNTER — Encounter: Payer: Self-pay | Admitting: Internal Medicine

## 2012-06-25 ENCOUNTER — Encounter: Payer: Self-pay | Admitting: Internal Medicine

## 2012-08-01 ENCOUNTER — Encounter: Payer: Self-pay | Admitting: Internal Medicine

## 2012-08-02 ENCOUNTER — Encounter (HOSPITAL_COMMUNITY): Payer: Self-pay | Admitting: Cardiology

## 2012-08-02 ENCOUNTER — Emergency Department (HOSPITAL_COMMUNITY)
Admission: EM | Admit: 2012-08-02 | Discharge: 2012-08-02 | Disposition: A | Discharge status: Home / Self Care | Payer: Medicare Other | Attending: Emergency Medicine | Admitting: Emergency Medicine

## 2012-08-02 ENCOUNTER — Emergency Department (HOSPITAL_COMMUNITY): Payer: Medicare Other

## 2012-08-02 ENCOUNTER — Encounter: Payer: Self-pay | Admitting: Internal Medicine

## 2012-08-02 DIAGNOSIS — N400 Enlarged prostate without lower urinary tract symptoms: Secondary | ICD-10-CM | POA: Insufficient documentation

## 2012-08-02 DIAGNOSIS — Z880 Allergy status to penicillin: Secondary | ICD-10-CM | POA: Insufficient documentation

## 2012-08-02 DIAGNOSIS — N318 Other neuromuscular dysfunction of bladder: Secondary | ICD-10-CM | POA: Insufficient documentation

## 2012-08-02 DIAGNOSIS — R7309 Other abnormal glucose: Secondary | ICD-10-CM | POA: Diagnosis not present

## 2012-08-02 DIAGNOSIS — M25512 Pain in left shoulder: Secondary | ICD-10-CM

## 2012-08-02 DIAGNOSIS — R739 Hyperglycemia, unspecified: Secondary | ICD-10-CM

## 2012-08-02 DIAGNOSIS — S46909A Unspecified injury of unspecified muscle, fascia and tendon at shoulder and upper arm level, unspecified arm, initial encounter: Secondary | ICD-10-CM | POA: Insufficient documentation

## 2012-08-02 DIAGNOSIS — W19XXXA Unspecified fall, initial encounter: Secondary | ICD-10-CM

## 2012-08-02 DIAGNOSIS — Z79899 Other long term (current) drug therapy: Secondary | ICD-10-CM | POA: Diagnosis not present

## 2012-08-02 DIAGNOSIS — M129 Arthropathy, unspecified: Secondary | ICD-10-CM | POA: Diagnosis not present

## 2012-08-02 DIAGNOSIS — E079 Disorder of thyroid, unspecified: Secondary | ICD-10-CM | POA: Insufficient documentation

## 2012-08-02 DIAGNOSIS — IMO0002 Reserved for concepts with insufficient information to code with codable children: Secondary | ICD-10-CM | POA: Diagnosis not present

## 2012-08-02 DIAGNOSIS — S4980XA Other specified injuries of shoulder and upper arm, unspecified arm, initial encounter: Secondary | ICD-10-CM | POA: Diagnosis not present

## 2012-08-02 DIAGNOSIS — T148XXA Other injury of unspecified body region, initial encounter: Secondary | ICD-10-CM

## 2012-08-02 DIAGNOSIS — E785 Hyperlipidemia, unspecified: Secondary | ICD-10-CM | POA: Insufficient documentation

## 2012-08-02 DIAGNOSIS — Z7982 Long term (current) use of aspirin: Secondary | ICD-10-CM | POA: Diagnosis not present

## 2012-08-02 DIAGNOSIS — Y9301 Activity, walking, marching and hiking: Secondary | ICD-10-CM | POA: Insufficient documentation

## 2012-08-02 DIAGNOSIS — W010XXA Fall on same level from slipping, tripping and stumbling without subsequent striking against object, initial encounter: Secondary | ICD-10-CM | POA: Insufficient documentation

## 2012-08-02 DIAGNOSIS — Z8679 Personal history of other diseases of the circulatory system: Secondary | ICD-10-CM | POA: Insufficient documentation

## 2012-08-02 DIAGNOSIS — Z87891 Personal history of nicotine dependence: Secondary | ICD-10-CM | POA: Diagnosis not present

## 2012-08-02 DIAGNOSIS — Y9239 Other specified sports and athletic area as the place of occurrence of the external cause: Secondary | ICD-10-CM | POA: Insufficient documentation

## 2012-08-02 DIAGNOSIS — M25519 Pain in unspecified shoulder: Secondary | ICD-10-CM | POA: Diagnosis not present

## 2012-08-02 DIAGNOSIS — R52 Pain, unspecified: Secondary | ICD-10-CM | POA: Diagnosis not present

## 2012-08-02 DIAGNOSIS — M25529 Pain in unspecified elbow: Secondary | ICD-10-CM | POA: Diagnosis not present

## 2012-08-02 DIAGNOSIS — S59909A Unspecified injury of unspecified elbow, initial encounter: Secondary | ICD-10-CM | POA: Diagnosis not present

## 2012-08-02 DIAGNOSIS — S6990XA Unspecified injury of unspecified wrist, hand and finger(s), initial encounter: Secondary | ICD-10-CM | POA: Diagnosis not present

## 2012-08-02 LAB — POCT I-STAT, CHEM 8
BUN: 11 mg/dL (ref 6–23)
Calcium, Ion: 1.24 mmol/L (ref 1.13–1.30)
Chloride: 102 mEq/L (ref 96–112)
Creatinine, Ser: 0.6 mg/dL (ref 0.50–1.35)
Glucose, Bld: 195 mg/dL — ABNORMAL HIGH (ref 70–99)
HCT: 41 % (ref 39.0–52.0)
Hemoglobin: 13.9 g/dL (ref 13.0–17.0)
Potassium: 4.2 mEq/L (ref 3.5–5.1)
Sodium: 138 mEq/L (ref 135–145)
TCO2: 28 mmol/L (ref 0–100)

## 2012-08-02 LAB — URINALYSIS, ROUTINE W REFLEX MICROSCOPIC
Bilirubin Urine: NEGATIVE
Glucose, UA: 500 mg/dL — AB
Hgb urine dipstick: NEGATIVE
Ketones, ur: NEGATIVE mg/dL
Leukocytes, UA: NEGATIVE
Nitrite: NEGATIVE
Protein, ur: NEGATIVE mg/dL
Specific Gravity, Urine: 1.023 (ref 1.005–1.030)
Urobilinogen, UA: 2 mg/dL — ABNORMAL HIGH (ref 0.0–1.0)
pH: 6 (ref 5.0–8.0)

## 2012-08-02 NOTE — ED Notes (Signed)
Pt reports that he was walking yesterday and felt like his center of gravity was off and he lost his balance. States that he caught himself with his hands and is having pain in the left shoulder. Denies any LOC reports he is only on asa. Pt A&O at triage. Denies any n/v or headache. No neuro deficits.

## 2012-08-02 NOTE — ED Notes (Signed)
Pt in xray

## 2012-08-02 NOTE — ED Provider Notes (Signed)
Medical screening examination/treatment/procedure(s) were conducted as a shared visit with non-physician practitioner(s) and myself.  I personally evaluated the patient during the encounter.  Left shoulder is negative drop test, he is able to actively abduct to 90, he is also able to externally and internally rotate with some weakness he has normal light touch over his deltoid region he also has intact distal light touch and motor function in the distributions of the median radial and ulnar nerves with his injury suspicious for partial rotator cuff tear.  Hurman Horn, MD 08/17/12 [phone]

## 2012-08-02 NOTE — ED Provider Notes (Signed)
History     CSN: [id number]  Arrival date & time 08/02/12  1048   First MD Initiated Contact with Patient 08/02/12 1200      Chief Complaint  Patient presents with  . Fall  . Shoulder Pain    (Consider location/radiation/quality/duration/timing/severity/associated sxs/prior treatment) HPI  77 year old male presents emergency department with chief complaint of fall, left arm and left shoulder pain.  The past medical history significant for prostatic hypertrophy, thyroid disease, hyperlipidemia.  He is followed by Dr. Darryll Capers is his primary care provider.  His orthopedic doctor is Dr. Madelon Lips.  Patient states that yesterday he was walking [place].  He frequently has problems with his balance.  Patient states that he lost his center of gravity and fell foreword stumbling until he fell onto his outstretched hands.  Patient did hit his right knee and has abrasions to the left elbow and palms.  He complains of pain in his left shoulder with weakness and difficulty with abduction.  Patient denies hitting his head or loss of consciousness.  He denies any history of arrhythmias and denies any racing or skipping in his heart.  He denies any symptoms of presyncope any TIA or strokelike symptoms yesterday.  Patient denies weakness, dizziness, vertigo.   Past Medical History  Diagnosis Date  . Thyroid disease   . Hyperlipidemia   . Hemorrhoids   . Overactive bladder   . Arthritis   . Low back pain   . Allergy     Past Surgical History  Procedure Laterality Date  . Index finger right thand    . Tonsillectomy    . Fx great toe    . Carpel tunnel release left      Family History  Problem Relation Age of Onset  . Stroke Father   . Lung cancer Mother     History  Substance Use Topics  . Smoking status: Former Smoker    Quit date: 03/25/1970  . Smokeless tobacco: Not on file  . Alcohol Use: Yes     Comment: 1 glass wine daily      Review of Systems Ten systems  reviewed and are negative for acute change, except as noted in the HPI.   Allergies  Penicillins  Home Medications   Current Outpatient Rx  Name  Route  Sig  Dispense  Refill  . ALPRAZolam (XANAX) 0.25 MG tablet      take 1 tablet by mouth three times a day if needed for anxiety   60 tablet   5   . aspirin 81 MG tablet   Oral   Take 81 mg by mouth daily.           Randa Ngo 30 MG/ACT SOLN      apply 1 APPLICATION ONTO SKIN DAILY   90 mL   5   . B Complex Vitamins (B COMPLEX 50) TABS   Oral   Take by mouth daily.           Marland Kitchen dutasteride (AVODART) 0.5 MG capsule   Oral   Take 1 capsule (0.5 mg total) by mouth daily.   30 capsule   11   . finasteride (PROSCAR) 5 MG tablet   Oral   Take 1 tablet (5 mg total) by mouth daily.         . fish oil-omega-3 fatty acids 1000 MG capsule   Oral   Take 2 g by mouth daily.         Marland Kitchen  folic acid (FOLVITE) 1 MG tablet   Oral   Take 1 tablet (1 mg total) by mouth daily.   90 tablet   3   . Glucosamine-Chondroit-Vit C-Mn (GLUCOSAMINE CHONDR 500 COMPLEX) CAPS   Oral   Take 1 capsule by mouth 2 (two) times daily.      0   . hydrocortisone 0.5 % cream   Topical   Apply topically 2 (two) times daily as needed.           . hydrocortisone-pramoxine (ANALPRAM-HC) 2.5-1 % rectal cream   Rectal   Place rectally as directed.   30 g   6   . levothyroxine (SYNTHROID) 50 MCG tablet   Oral   Take 1 tablet (50 mcg total) by mouth daily.   90 tablet   3   . Niacin-Inositol (NIACIN FLUSH FREE) 400-100 MG CAPS   Oral   Take 1 capsule by mouth 2 (two) times daily with a meal.   30 each   0   . Polyethyl Glycol-Propyl Glycol (SYSTANE ULTRA) 0.4-0.3 % SOLN   Ophthalmic   Apply to eye as needed.           . sertraline (ZOLOFT) 25 MG tablet   Oral   Take 1 tablet (25 mg total) by mouth daily.   90 tablet   3   . simvastatin (ZOCOR) 20 MG tablet      TAKE 1 TABLET EACH EVENING   90 tablet   3   . tamsulosin  (FLOMAX) 0.4 MG CAPS   Oral   Take 1 capsule (0.4 mg total) by mouth daily after supper.   30 capsule   11     BP 130/71  Pulse 66  Temp(Src) 98.3 F (36.8 C) (Oral)  Resp 20  SpO2 97%  Physical Exam  Physical Exam  Nursing note and vitals reviewed. Constitutional: He appears well-developed and well-nourished. No distress.  very pleasant elderly male in no acute distress appearing his stated age. HENT:  Head: Normocephalic and atraumatic.  Eyes: Conjunctivae normal are normal. No scleral icterus.  Neck: Normal range of motion. Neck supple.  Cardiovascular: Normal rate, regular rhythm and normal heart sounds.   Pulmonary/Chest: Effort normal and breath sounds normal. No respiratory distress.  no peripheral edema distal pulses are intact. Abdominal: Soft. There is no tenderness.  Musculoskeletal: He exhibits no edema.  patient is tender to palpation over the left a.c. joint and anterior deltoid.  He has limited range of motion due to pain.  Pain is worse with abduction of the shoulder.  He has some weakness with abduction.  Negative cross arm test negative Yergason's.  radial pulse intact. Neurological: He is alert.  Skin: Skin is warm and dry. He is not diaphoretic.  abrasion to the left elbow and right knee . Psychiatric: His behavior is normal.    ED Course  Procedures (including critical care time)  Labs Reviewed  URINALYSIS, ROUTINE W REFLEX MICROSCOPIC   Dg Elbow Complete Left  08/02/2012  *RADIOLOGY REPORT*  Clinical Data: Fall, left elbow pain  LEFT ELBOW - COMPLETE 3+ VIEW  Comparison: None.  Findings: Mild radial head degenerative change identified.  No joint effusion.  No fracture or dislocation.  No radiopaque foreign body.  Apparent bandage material over the soft tissues posteriorly.  IMPRESSION: No acute osseous abnormality of the left elbow.   Original Report Authenticated By: Christiana Pellant, M.D.    Dg Shoulder Left  08/02/2012  *RADIOLOGY REPORT*  Clinical  Data: Fall, shoulder pain  LEFT SHOULDER - 2+ VIEW  Comparison: None.  Findings: No fracture or dislocation.  No soft tissue abnormality. No radiopaque foreign body.  Left lung apex is clear.  Minimal left shoulder degenerative change.  IMPRESSION: No acute osseous abnormality of the left shoulder.   Original Report Authenticated By: Christiana Pellant, M.D.      1. Fall, initial encounter   2. Abrasion   3. Hyperglycemia   4. Shoulder pain, acute, left       MDM  12:49 PM Filed Vitals:   08/02/12 1106  BP: 130/71  Pulse: 66  Temp: 98.3 F (36.8 C)  Resp: 20   Is a well-appearing male with negative x-rays of the left elbow and shoulder.  I suspect rotator cuff injury.  The patient is up-to-date on his tetanus vaccination.  Check a chem 8 and urine to make sure he doesn't have urinary tract infection causing some of his balance issues.  Patient denies any acute pain at this time and is not want any pain mediation.  We'll give the patient sling for his shoulder and have him follow up with Dr. Madelon Lips. Wounds cleansed and redressed.     4:08 PM BP 137/78  Pulse 60  Temp(Src) 98.3 F (36.8 C) (Oral)  Resp 18  SpO2 98% Patient with hyperglycemia and glucosuria. I spent approximately 10 minutes speaking with the patient about his blood sugars and glucosuria.  He is already aware of this issue and has follow up appointment with his pcp in early June. Technically the patient now has 2 cbg  >120 over the past 2 moths which I believe qualifies as a diagnosis of Diabetes. i have sugested that the patient may need HGB A1c and should follow up shortly with his pcp. It is difficult to tell if his frequent urination is due to glucosuria or BPH. Encouraged continued exercise and discussed basic carb modified diet/ glycemic index. The patient appears reasonably screened and/or stabilized for discharge and I doubt any other medical condition or other [facility] requiring further screening, evaluation, or  treatment in the ED at this time prior to discharge.   Arthor Captain, PA-C 08/02/12 [phone]

## 2012-08-02 NOTE — ED Notes (Signed)
PA at bedside.

## 2012-08-03 DIAGNOSIS — M25519 Pain in unspecified shoulder: Secondary | ICD-10-CM | POA: Diagnosis not present

## 2012-08-24 DIAGNOSIS — M545 Low back pain, unspecified: Secondary | ICD-10-CM | POA: Diagnosis not present

## 2012-08-24 DIAGNOSIS — M25519 Pain in unspecified shoulder: Secondary | ICD-10-CM | POA: Diagnosis not present

## 2012-08-31 ENCOUNTER — Encounter: Payer: Self-pay | Admitting: Internal Medicine

## 2012-08-31 ENCOUNTER — Ambulatory Visit (INDEPENDENT_AMBULATORY_CARE_PROVIDER_SITE_OTHER): Payer: Medicare Other | Admitting: Internal Medicine

## 2012-08-31 VITALS — BP 134/80 | HR 72 | Temp 98.3°F | Resp 16 | Ht 69.0 in | Wt 173.0 lb

## 2012-08-31 DIAGNOSIS — N401 Enlarged prostate with lower urinary tract symptoms: Secondary | ICD-10-CM

## 2012-08-31 DIAGNOSIS — E669 Obesity, unspecified: Secondary | ICD-10-CM

## 2012-08-31 DIAGNOSIS — N138 Other obstructive and reflux uropathy: Secondary | ICD-10-CM

## 2012-08-31 DIAGNOSIS — E039 Hypothyroidism, unspecified: Secondary | ICD-10-CM

## 2012-08-31 DIAGNOSIS — F329 Major depressive disorder, single episode, unspecified: Secondary | ICD-10-CM

## 2012-08-31 DIAGNOSIS — E785 Hyperlipidemia, unspecified: Secondary | ICD-10-CM | POA: Diagnosis not present

## 2012-08-31 DIAGNOSIS — IMO0001 Reserved for inherently not codable concepts without codable children: Secondary | ICD-10-CM | POA: Diagnosis not present

## 2012-08-31 LAB — HEMOGLOBIN A1C: Hgb A1c MFr Bld: 7.9 % — ABNORMAL HIGH (ref 4.6–6.5)

## 2012-08-31 LAB — TSH: TSH: 2.16 u[IU]/mL (ref 0.35–5.50)

## 2012-08-31 NOTE — Telephone Encounter (Signed)
Referred to nutritionist  

## 2012-08-31 NOTE — Patient Instructions (Addendum)
Go to every other day on the Flomax and see if you notice any change in your prostate flow if you noticed no change tried going off of it and monitor for about 2 weeks if the flow remains the same, stay off the medication

## 2012-08-31 NOTE — Progress Notes (Signed)
  Subjective:    Patient ID: Stephen Robbins, male    DOB: Apr 18, 1935, 77 y.o.   MRN: [id number]  HPI Hyperlipidemia Hypothyroidism Hx of BPH Depression  Due to wife's ilness  Added b12 Anxiety medications discussed Follow up on the prostate   Review of Systems  Constitutional: Negative for fever and fatigue.  HENT: Positive for hearing loss. Negative for congestion, neck pain and postnasal drip.   Eyes: Negative for discharge, redness and visual disturbance.  Respiratory: Negative for cough and wheezing.   Cardiovascular: Negative for leg swelling.  Gastrointestinal: Negative for abdominal pain, constipation and abdominal distention.  Genitourinary: Positive for urgency and frequency.  Musculoskeletal: Negative for joint swelling and arthralgias.  Skin: Negative for color change and rash.  Neurological: Negative for weakness and light-headedness.  Hematological: Negative for adenopathy.  Psychiatric/Behavioral: Negative for behavioral problems.       Objective:   Physical Exam  Nursing note and vitals reviewed. Constitutional: He appears well-developed and well-nourished.  HENT:  Head: Normocephalic and atraumatic.  Eyes: Conjunctivae are normal. Pupils are equal, round, and reactive to light.  Neck: Normal range of motion. Neck supple.  Cardiovascular: Normal rate and regular rhythm.   Pulmonary/Chest: Effort normal and breath sounds normal.  Abdominal: Soft. Bowel sounds are normal.          Assessment & Plan:  Xanax for anxiety over wife's memory disorder Progressive BPH on flomax  Prostate with incontinance  zoloft evrery day  Lipid stable and carotids stable

## 2012-09-02 ENCOUNTER — Telehealth: Payer: Self-pay | Admitting: Internal Medicine

## 2012-09-02 NOTE — Telephone Encounter (Signed)
PT called because he's having trouble with his mychart account. He states that he has received two new notifications, that are locked, and he is unable access them. He is under the assumption that one of the notifications are his labs, and that the other might be a message from Dr. Lovell Sheehan. FYI.

## 2012-09-02 NOTE — Telephone Encounter (Signed)
Stephen Robbins will send in ticket for pt and I called pt and Left message on machine' And gave him results

## 2012-09-08 ENCOUNTER — Encounter: Payer: Medicare Other | Attending: Internal Medicine | Admitting: *Deleted

## 2012-09-08 VITALS — Ht 69.0 in | Wt 177.3 lb

## 2012-09-08 DIAGNOSIS — R7309 Other abnormal glucose: Secondary | ICD-10-CM

## 2012-09-08 DIAGNOSIS — E119 Type 2 diabetes mellitus without complications: Secondary | ICD-10-CM | POA: Insufficient documentation

## 2012-09-08 DIAGNOSIS — Z713 Dietary counseling and surveillance: Secondary | ICD-10-CM | POA: Insufficient documentation

## 2012-09-12 ENCOUNTER — Encounter: Payer: Self-pay | Admitting: Internal Medicine

## 2012-09-14 ENCOUNTER — Encounter: Payer: Self-pay | Admitting: *Deleted

## 2012-09-14 DIAGNOSIS — M25519 Pain in unspecified shoulder: Secondary | ICD-10-CM | POA: Diagnosis not present

## 2012-09-14 NOTE — Progress Notes (Signed)
Patient was seen on 09/08/2012 for the first of a series of three diabetes self-management courses at [facility]. Patient's most recent A1c was 7.9 % on 08/31/2012 The following learning objectives were met by the patient during this course:   Defines the role of glucose and insulin  Identifies type of diabetes and pathophysiology  Defines the diagnostic criteria for diabetes and prediabetes  States the risk factors for Type 2 Diabetes  States the symptoms of Type 2 Diabetes  Defines Type 2 Diabetes treatment goals  Defines Type 2 Diabetes treatment options  States the rationale for glucose monitoring  Identifies A1C, glucose targets, and testing times  Identifies proper sharps disposal  Defines the purpose of a diabetes food plan  Identifies carbohydrate food groups  Defines effects of carbohydrate foods on glucose levels  Identifies carbohydrate choices/grams/food labels  States benefits of physical activity and effect on glucose  Review of suggested activity guidelines  Handouts given during class include:  Type 2 Diabetes: Basics Book  My Food Plan Book  Food and Activity Log   Follow-Up Plan: Core Class 2

## 2012-10-28 ENCOUNTER — Other Ambulatory Visit: Payer: Self-pay

## 2012-11-12 ENCOUNTER — Other Ambulatory Visit: Payer: Self-pay | Admitting: Internal Medicine

## 2012-11-19 ENCOUNTER — Encounter: Payer: Self-pay | Admitting: Family

## 2012-11-19 ENCOUNTER — Ambulatory Visit (INDEPENDENT_AMBULATORY_CARE_PROVIDER_SITE_OTHER): Payer: Medicare Other | Admitting: Family

## 2012-11-19 VITALS — BP 120/82 | HR 55 | Temp 97.9°F | Wt 171.0 lb

## 2012-11-19 DIAGNOSIS — R42 Dizziness and giddiness: Secondary | ICD-10-CM | POA: Diagnosis not present

## 2012-11-19 DIAGNOSIS — R001 Bradycardia, unspecified: Secondary | ICD-10-CM

## 2012-11-19 DIAGNOSIS — R350 Frequency of micturition: Secondary | ICD-10-CM | POA: Diagnosis not present

## 2012-11-19 DIAGNOSIS — I498 Other specified cardiac arrhythmias: Secondary | ICD-10-CM | POA: Diagnosis not present

## 2012-11-19 LAB — POCT URINALYSIS DIPSTICK
Bilirubin, UA: NEGATIVE
Blood, UA: NEGATIVE
Glucose, UA: NEGATIVE
Ketones, UA: NEGATIVE
Leukocytes, UA: NEGATIVE
Nitrite, UA: NEGATIVE
Protein, UA: NEGATIVE
Spec Grav, UA: 1.01
Urobilinogen, UA: 0.2
pH, UA: 6

## 2012-11-19 NOTE — Progress Notes (Signed)
Subjective:    Patient ID: Stephen Robbins, male    DOB: 1935/09/01, 77 y.o.   MRN: [id number]  HPI  77 year old white male, nonsmoker, patient of Dr. Lovell Sheehan with a history of type 2 diabetes, hyperlipidemia, hypothyroidism and hypertension is in today with complaints of episodes of feeling dizzy while walking. The episodes usually occur while he is walking he will suddenly feel off balance. They have been ongoing x6 months. He denies any nausea, vomiting, shortness of breath, chest pain or palpitations. Patient reports having a fall 08/21/2012 related to feeling dizzy. He had a carotid Doppler performed in February 2014 that was stable. No major blockages. Reports urinary frequency related to BPH. Family history of cardiovascular disease.  Review of Systems  Constitutional: Negative.   HENT: Negative.   Respiratory: Negative.   Cardiovascular: Negative.   Gastrointestinal: Negative.   Endocrine: Negative.   Genitourinary: Positive for frequency. Negative for dysuria.  Musculoskeletal: Negative.   Skin: Negative.   Allergic/Immunologic: Negative.   Neurological: Positive for dizziness and light-headedness. Negative for weakness.  Hematological: Negative.   Psychiatric/Behavioral: Negative.    Past Medical History  Diagnosis Date  . Thyroid disease   . Hyperlipidemia   . Hemorrhoids   . Overactive bladder   . Arthritis   . Low back pain   . Allergy   . Diabetes mellitus without complication     History   Social History  . Marital Status: Married    Spouse Name: N/A    Number of Children: 0  . Years of Education: N/A   Occupational History  . retired    Social History Main Topics  . Smoking status: Former Smoker    Quit date: 03/25/1970  . Smokeless tobacco: Not on file  . Alcohol Use: Yes     Comment: 1 glass wine daily  . Drug Use: No  . Sexual Activity: Yes   Other Topics Concern  . Not on file   Social History Narrative   Regular exercise          Past  Surgical History  Procedure Laterality Date  . Index finger right thand    . Tonsillectomy    . Fx great toe    . Carpel tunnel release left      Family History  Problem Relation Age of Onset  . Stroke Father   . Lung cancer Mother     Allergies  Allergen Reactions  . Penicillins     REACTION: unspecified    Current Outpatient Prescriptions on File Prior to Visit  Medication Sig Dispense Refill  . ALPRAZolam (XANAX) 0.25 MG tablet take 1 tablet by mouth three times a day if needed for anxiety  60 tablet  5  . aspirin 81 MG tablet Take 81 mg by mouth daily.        . B Complex Vitamins (B COMPLEX 50) TABS Take by mouth daily.        Marland Kitchen dutasteride (AVODART) 0.5 MG capsule Take 1 capsule (0.5 mg total) by mouth daily.  30 capsule  11  . fish oil-omega-3 fatty acids 1000 MG capsule Take 2 g by mouth daily.      . Glucosamine-Chondroit-Vit C-Mn (GLUCOSAMINE CHONDR 500 COMPLEX) CAPS Take 1 capsule by mouth 2 (two) times daily.    0  . hydrocortisone 0.5 % cream Apply 1 application topically 2 (two) times daily as needed. rash      . hydrocortisone-pramoxine (ANALPRAM-HC) 2.5-1 % rectal cream Place rectally as directed.  30 g  6  . ibuprofen (ADVIL,MOTRIN) 200 MG tablet Take 200 mg by mouth every 6 (six) hours as needed for pain.      Marland Kitchen levothyroxine (SYNTHROID) 50 MCG tablet Take 1 tablet (50 mcg total) by mouth daily.  90 tablet  3  . Niacin-Inositol (NIACIN FLUSH FREE) 400-100 MG CAPS Take 1 capsule by mouth 2 (two) times daily with a meal.  30 each  0  . Polyethyl Glycol-Propyl Glycol (SYSTANE ULTRA) 0.4-0.3 % SOLN Place 1 drop into both eyes daily as needed.       . sertraline (ZOLOFT) 25 MG tablet Take 25 mg by mouth daily.      . sertraline (ZOLOFT) 25 MG tablet take 1 tablet by mouth once daily  90 tablet  3  . simvastatin (ZOCOR) 20 MG tablet TAKE 1 TABLET EACH EVENING  90 tablet  3  . tamsulosin (FLOMAX) 0.4 MG CAPS Take 0.4 mg by mouth.      . benzonatate (TESSALON) 100 MG  capsule Take 100 mg by mouth 3 (three) times daily as needed for cough.       No current facility-administered medications on file prior to visit.    BP 120/82  Pulse 55  Temp(Src) 97.9 F (36.6 C) (Oral)  Wt 171 lb (77.565 kg)  BMI 25.24 kg/m2chart    Objective:   Physical Exam  Constitutional: He is oriented to person, place, and time. He appears well-developed and well-nourished.  HENT:  Right Ear: External ear normal.  Left Ear: External ear normal.  Nose: Nose normal.  Mouth/Throat: Oropharynx is clear and moist.  Neck: Normal range of motion. Neck supple.  Cardiovascular: Normal rate, regular rhythm and normal heart sounds.   Pulmonary/Chest: Effort normal and breath sounds normal.  Abdominal: Soft. Bowel sounds are normal. He exhibits no distension. There is no tenderness.  Neurological: He is alert and oriented to person, place, and time.  Skin: Skin is warm and dry.  Psychiatric: He has a normal mood and affect.          Assessment & Plan:  Assessment: 1. Dizziness 2. BPH 3. Fall risk  Plan: Urine dip sent. Will notify patient of results. Refer to cardiology for possible stress testing. Patient was found to be bradycardic today. Discuss further treatment plan after cardiology does their workup.

## 2012-11-19 NOTE — Patient Instructions (Addendum)
Bradycardia Bradycardia is a term for a heart rate (pulse) that, in adults, is slower than 60 beats per minute. A normal rate is 60 to 100 beats per minute. A heart rate below 60 beats per minute may be normal for some adults with healthy hearts. If the rate is too slow, the heart may have trouble pumping the volume of blood the body needs. If the heart rate gets too low, blood flow to the brain may be decreased and may make you feel lightheaded, dizzy, or faint. The heart has a natural pacemaker in the top of the heart called the SA node (sinoatrial or sinus node). This pacemaker sends out regular electrical signals to the muscle of the heart, telling the heart muscle when to beat (contract). The electrical signal travels from the upper parts of the heart (atria) through the AV node (atrioventricular node), to the lower chambers of the heart (ventricles). The ventricles squeeze, pumping the blood from your heart to your lungs and to the rest of your body. CAUSES   Problem with the heart's electrical system.  Problem with the heart's natural pacemaker.  Heart disease, damage, or infection.  Medications.  Problems with minerals and salts (electrolytes). SYMPTOMS   Fainting (syncope).  Fatigue and weakness.  Shortness of breath (dyspnea).  Chest pain (angina).  Drowsiness.  Confusion. DIAGNOSIS   An electrocardiogram (ECG) can help your caregiver determine the type of slow heart rate you have.  If the cause is not seen on an ECG, you may need to wear a heart monitor that records your heart rhythm for several hours or days.  Blood tests. TREATMENT   Electrolyte supplements.  Medications.  Withholding medication which is causing a slow heart rate.  Pacemaker placement. SEEK IMMEDIATE MEDICAL CARE IF:   You feel lightheaded or faint.  You develop an irregular heart rate.  You feel chest pain or have trouble breathing. MAKE SURE YOU:   Understand these  instructions.  Will watch your condition.  Will get help right away if you are not doing well or get worse. Document Released: 12/01/2001 Document Revised: 06/03/2011 Document Reviewed: 10/28/2007 [employer] Patient Information 2014 [employer].  

## 2012-11-23 ENCOUNTER — Encounter: Payer: Self-pay | Admitting: Family

## 2012-11-25 ENCOUNTER — Ambulatory Visit (INDEPENDENT_AMBULATORY_CARE_PROVIDER_SITE_OTHER): Payer: Medicare Other | Admitting: Cardiovascular Disease

## 2012-11-25 ENCOUNTER — Encounter: Payer: Self-pay | Admitting: Cardiovascular Disease

## 2012-11-25 VITALS — BP 122/74 | HR 60 | Ht 69.0 in | Wt 171.0 lb

## 2012-11-25 DIAGNOSIS — I498 Other specified cardiac arrhythmias: Secondary | ICD-10-CM

## 2012-11-25 DIAGNOSIS — R42 Dizziness and giddiness: Secondary | ICD-10-CM

## 2012-11-25 DIAGNOSIS — R001 Bradycardia, unspecified: Secondary | ICD-10-CM

## 2012-11-25 DIAGNOSIS — R0609 Other forms of dyspnea: Secondary | ICD-10-CM

## 2012-11-25 DIAGNOSIS — R0600 Dyspnea, unspecified: Secondary | ICD-10-CM

## 2012-11-25 NOTE — Patient Instructions (Addendum)
Your physician has recommended you make the following change in your medication: stop flomax (tamsulosin)  Your physician has recommended that you wear an event monitor. Event monitors are medical devices that record the heart's electrical activity. Doctors most often [place] these monitors to diagnose arrhythmias. Arrhythmias are problems with the speed or rhythm of the heartbeat. The monitor is a small, portable device. You can wear one while you do your normal daily activities. This is usually used to diagnose what is causing palpitations/syncope (passing out).  Your physician has requested that you have en exercise stress test. For further information please visit [email or web address] Please follow instruction sheet, as given.  Your physician recommends that you schedule a follow-up appointment in: 3 weeks

## 2012-11-26 DIAGNOSIS — R42 Dizziness and giddiness: Secondary | ICD-10-CM | POA: Insufficient documentation

## 2012-11-26 DIAGNOSIS — R001 Bradycardia, unspecified: Secondary | ICD-10-CM | POA: Insufficient documentation

## 2012-11-26 NOTE — Assessment & Plan Note (Addendum)
Mr Herd has mild sinus bradycardia and I do not think this can explain either his dizziness or his dyspnea. On the other hand, he may also have chronotropic incompetence which could explain his dyspnea and fatigue. I do not see a relationship between his bradyarrhythmia and his poor balance and tendency to fall. This sounds more like an issue with his proprioception in the vestibular systems. May be a sign of generalized muscle weakness.  I have recommended that he wear an event monitor and undergo a treadmill ECG test to see if we can define chronotropic incompetence or any connection between his instability and bradycardia/arrhythmia. If meaningful bradycardia and/or chronotropic incompetence are demonstrated he may benefit from implantation of a dual-chamber permanent pacemaker, particularly one that can provide both minute ventilation and accelerometer driven SENSOR.

## 2012-11-26 NOTE — Assessment & Plan Note (Signed)
Some of his complaints to suggest orthostatic dizziness/orthostatic hypotension. Getting up in the morning he often has a sensation of lightheadedness. This also occurs when he bends over. Today in the office were able to demonstrate only a roughly 14 mm Hg drop in blood pressure from a laying to a standing position. The drop in blood pressure may be more pronounced if he is relatively dehydrated, as would be expected in the early morning. He should avoid diuretics. He should eat a relatively sodium rich diet.  I have recommended that he discontinue Flomax. He has taken his medication for roughly a year and this coincides with the onset of his worst complaints. He also does not think that it has led to any major improvement in his dysuria. Other potential offending drugs include sertraline, but he is taking this in a very low dose. Again I think that his dizziness is likely to be related to other neurological/autonomic abnormalities. His episodes of loss of balance have never been associated with a sensation of loss of consciousness.Marland Kitchen

## 2012-11-26 NOTE — Progress Notes (Signed)
Patient ID: Stephen Robbins, male   DOB: 1935-07-15, 77 y.o.   MRN: [id number]     Reason for office visit Evaluate bradycardia, dizziness  [name] is an 77 year old man who has approximately one year of worsening dizziness fatigue and dyspnea on exertion. What seems to bother him the most is a rather unsteady gait and posture. He is afraid of losing his balance and falling. This is not associated with a sensation of presyncope or syncope. He has also noticed worsening stamina. He still goes to the gym 3-5 times a week but has developed worsening  fatigue and dyspnea on exertion. He appears to describe NYHA functional class II status. He denies clear-cut focal neurological deficits, syncope, palpitations, anginal or pleuritic chest pain, lower showed edema, tremor, intermittent claudication. He has recently been diagnosed with borderline diabetes mellitus this time to lose weight in an effort to avoid needing medication for this He wakes up every night 2-3 times to urinate and has prominent symptoms of prostatism. He has been taking an alpha-blocker about a year now but without much clinical improvement. In his primary care physician's office his heart rate was recently 55 beats per minute. When he exercises at the gym he notes that his heart rate down he plateaus at about 100 beats per minute.     Allergies  Allergen Reactions  . Penicillins     REACTION: unspecified    Current Outpatient Prescriptions  Medication Sig Dispense Refill  . ALPRAZolam (XANAX) 0.25 MG tablet take 1 tablet by mouth three times a day if needed for anxiety  60 tablet  5  . aspirin 81 MG tablet Take 81 mg by mouth daily.        . B Complex Vitamins (B COMPLEX 50) TABS Take by mouth daily.        . benzonatate (TESSALON) 100 MG capsule Take 100 mg by mouth 3 (three) times daily as needed for cough.      . dutasteride (AVODART) 0.5 MG capsule Take 1 capsule (0.5 mg total) by mouth daily.  30 capsule  11  . finasteride  (PROSCAR) 5 MG tablet Take 5 mg by mouth daily.      . fish oil-omega-3 fatty acids 1000 MG capsule Take 2 g by mouth daily.      . Glucosamine-Chondroit-Vit C-Mn (GLUCOSAMINE CHONDR 500 COMPLEX) CAPS Take 1 capsule by mouth 2 (two) times daily.    0  . hydrocortisone 0.5 % cream Apply 1 application topically 2 (two) times daily as needed. rash      . hydrocortisone-pramoxine (ANALPRAM-HC) 2.5-1 % rectal cream Place rectally as directed.  30 g  6  . ibuprofen (ADVIL,MOTRIN) 200 MG tablet Take 200 mg by mouth every 6 (six) hours as needed for pain.      Marland Kitchen levothyroxine (SYNTHROID) 50 MCG tablet Take 1 tablet (50 mcg total) by mouth daily.  90 tablet  3  . Niacin-Inositol (NIACIN FLUSH FREE) 400-100 MG CAPS Take 1 capsule by mouth 2 (two) times daily with a meal.  30 each  0  . Polyethyl Glycol-Propyl Glycol (SYSTANE ULTRA) 0.4-0.3 % SOLN Place 1 drop into both eyes daily as needed.       . sertraline (ZOLOFT) 25 MG tablet take 1 tablet by mouth once daily  90 tablet  3  . simvastatin (ZOCOR) 20 MG tablet TAKE 1 TABLET EACH EVENING  90 tablet  3   No current facility-administered medications for this visit.    Past  Medical History  Diagnosis Date  . Thyroid disease   . Hyperlipidemia   . Hemorrhoids   . Overactive bladder   . Arthritis   . Low back pain   . Allergy   . Diabetes mellitus without complication     Past Surgical History  Procedure Laterality Date  . Index finger right thand    . Tonsillectomy    . Fx great toe    . Carpel tunnel release left      Family History  Problem Relation Age of Onset  . Stroke Father   . Lung cancer Mother     History   Social History  . Marital Status: Married    Spouse Name: N/A    Number of Children: 0  . Years of Education: N/A   Occupational History  . retired    Social History Main Topics  . Smoking status: Former Smoker    Quit date: 03/25/1970  . Smokeless tobacco: Not on file  . Alcohol Use: Yes     Comment: 1 glass  wine daily  . Drug Use: No  . Sexual Activity: Yes   Other Topics Concern  . Not on file   Social History Narrative   Regular exercise          Review of systems: The patient specifically denies any chest pain at rest or with exertion, dyspnea at rest or with exertion, orthopnea, paroxysmal nocturnal dyspnea, syncope, palpitations, focal neurological deficits, intermittent claudication, lower extremity edema, unexplained weight gain, cough, hemoptysis or wheezing.  The patient also denies abdominal pain, nausea, vomiting, dysphagia, diarrhea, constipation, polyuria, polydipsia, dysuria, hematuria, frequency, urgency, abnormal bleeding or bruising, fever, chills, unexpected weight changes, mood swings, change in skin or hair texture, change in voice quality, auditory or visual problems, allergic reactions or rashes, new musculoskeletal complaints other than usual "aches and pains".   PHYSICAL EXAM BP 122/74  Pulse 60  Ht 5\' 9"  (1.753 m)  Wt 171 lb (77.565 kg)  BMI 25.24 kg/m2 The patient specifically denies any chest pain at rest or with exertion, dyspnea at rest, orthopnea, paroxysmal nocturnal dyspnea, syncope, palpitations, focal neurological deficits, intermittent claudication, lower extremity edema, unexplained weight gain, cough, hemoptysis or wheezing.  The patient also denies abdominal pain, nausea, vomiting, dysphagia, diarrhea, constipation, polyuria, polydipsia, abnormal bleeding or bruising, fever, chills, unexpected weight changes, mood swings, change in skin or hair texture, change in voice quality, auditory or visual problems, allergic reactions or rashes, new musculoskeletal complaints other than usual "aches and pains".   EKG:  Sinus rhythm/mild sinus bradycardia   Lipid Panel     Component Value Date/Time   CHOL 117 02/27/2011 1038   TRIG 126.0 02/27/2011 1038   HDL 29.70* 02/27/2011 1038   CHOLHDL 4 02/27/2011 1038   VLDL 25.2 02/27/2011 1038   LDLCALC 62  02/27/2011 1038    BMET    Component Value Date/Time   NA 138 08/02/2012 1404   K 4.2 08/02/2012 1404   CL 102 08/02/2012 1404   CO2 27 05/25/2012 1429   GLUCOSE 195* 08/02/2012 1404   GLUCOSE 108* 02/20/2006 0838   BUN 11 08/02/2012 1404   CREATININE 0.60 08/02/2012 1404   CALCIUM 9.5 05/25/2012 1429   GFRNONAA 121.77 09/26/2009 0859   GFRAA 123 09/10/2007 0832     ASSESSMENT AND PLAN Bradycardia Mr Tufo has mild sinus bradycardia and I do not think this can explain either his dizziness or his dyspnea. On the other hand, he may also  have chronotropic incompetence which could explain his dyspnea and fatigue. I do not see a relationship between his bradyarrhythmia and his poor balance and tendency to fall. This sounds more like an issue with his proprioception in the vestibular systems. May be a sign of generalized muscle weakness.  I have recommended that he wear an event monitor and undergo a treadmill ECG test to see if we can define chronotropic incompetence or any connection between his instability and bradycardia/arrhythmia. If meaningful bradycardia and/or chronotropic incompetence are demonstrated he may benefit from implantation of a dual-chamber permanent pacemaker, particularly one that can provide both minute ventilation and accelerometer driven SENSOR.  Dizziness, nonspecific Some of his complaints to suggest orthostatic dizziness/orthostatic hypotension. Getting up in the morning he often has a sensation of lightheadedness. This also occurs when he bends over. Today in the office were able to demonstrate only a roughly 14 mm Hg drop in blood pressure from a laying to a standing position. The drop in blood pressure may be more pronounced if he is relatively dehydrated, as would be expected in the early morning. He should avoid diuretics. He should eat a relatively sodium rich diet.  I have recommended that he discontinue Flomax. He has taken his medication for roughly a year and this  coincides with the onset of his worst complaints. He also does not think that it has led to any major improvement in his dysuria. Other potential offending drugs include sertraline, but he is taking this in a very low dose. Again I think that his dizziness is likely to be related to other neurological/autonomic abnormalities. His episodes of loss of balance have never been associated with a sensation of loss of consciousness..  Orders Placed This Encounter  Procedures  . Exercise tolerance test  . Cardiac event monitor   Meds ordered this encounter  Medications  . finasteride (PROSCAR) 5 MG tablet    Sig: Take 5 mg by mouth daily.    Junious Silk, MD, [facility] [phone] office [phone] pager

## 2012-12-02 ENCOUNTER — Encounter (HOSPITAL_COMMUNITY): Payer: Medicare Other

## 2012-12-03 ENCOUNTER — Ambulatory Visit (HOSPITAL_COMMUNITY)
Admission: RE | Admit: 2012-12-03 | Discharge: 2012-12-03 | Disposition: A | Discharge status: Home / Self Care | Payer: Medicare Other | Source: Ambulatory Visit | Attending: Cardiovascular Disease | Admitting: Cardiovascular Disease

## 2012-12-03 DIAGNOSIS — R42 Dizziness and giddiness: Secondary | ICD-10-CM | POA: Diagnosis not present

## 2012-12-03 DIAGNOSIS — R0600 Dyspnea, unspecified: Secondary | ICD-10-CM

## 2012-12-03 DIAGNOSIS — R0989 Other specified symptoms and signs involving the circulatory and respiratory systems: Secondary | ICD-10-CM | POA: Insufficient documentation

## 2012-12-03 DIAGNOSIS — R0609 Other forms of dyspnea: Secondary | ICD-10-CM | POA: Diagnosis not present

## 2012-12-07 DIAGNOSIS — H2510 Age-related nuclear cataract, unspecified eye: Secondary | ICD-10-CM | POA: Diagnosis not present

## 2012-12-11 ENCOUNTER — Other Ambulatory Visit: Payer: Self-pay | Admitting: Urology

## 2012-12-11 DIAGNOSIS — N138 Other obstructive and reflux uropathy: Secondary | ICD-10-CM | POA: Diagnosis not present

## 2012-12-11 DIAGNOSIS — N401 Enlarged prostate with lower urinary tract symptoms: Secondary | ICD-10-CM | POA: Diagnosis not present

## 2012-12-11 DIAGNOSIS — R3915 Urgency of urination: Secondary | ICD-10-CM | POA: Diagnosis not present

## 2012-12-13 ENCOUNTER — Other Ambulatory Visit: Payer: Self-pay | Admitting: Internal Medicine

## 2012-12-13 DIAGNOSIS — T887XXA Unspecified adverse effect of drug or medicament, initial encounter: Secondary | ICD-10-CM

## 2012-12-14 ENCOUNTER — Other Ambulatory Visit: Payer: Self-pay | Admitting: *Deleted

## 2012-12-14 MED ORDER — LEVOTHYROXINE SODIUM 50 MCG PO TABS: 50.0000 ug | ORAL_TABLET | Freq: Every day | ORAL | Status: DC

## 2012-12-15 NOTE — Telephone Encounter (Signed)
This has been discontinued 

## 2012-12-17 ENCOUNTER — Encounter (HOSPITAL_BASED_OUTPATIENT_CLINIC_OR_DEPARTMENT_OTHER): Payer: Self-pay | Admitting: *Deleted

## 2012-12-18 ENCOUNTER — Ambulatory Visit (INDEPENDENT_AMBULATORY_CARE_PROVIDER_SITE_OTHER): Payer: Medicare Other | Admitting: Cardiovascular Disease

## 2012-12-18 ENCOUNTER — Encounter: Payer: Self-pay | Admitting: Cardiovascular Disease

## 2012-12-18 ENCOUNTER — Ambulatory Visit (INDEPENDENT_AMBULATORY_CARE_PROVIDER_SITE_OTHER): Payer: Medicare Other

## 2012-12-18 VITALS — BP 130/70 | HR 62 | Ht 69.0 in | Wt 177.6 lb

## 2012-12-18 DIAGNOSIS — R001 Bradycardia, unspecified: Secondary | ICD-10-CM

## 2012-12-18 DIAGNOSIS — I472 Ventricular tachycardia: Secondary | ICD-10-CM | POA: Diagnosis not present

## 2012-12-18 DIAGNOSIS — R0600 Dyspnea, unspecified: Secondary | ICD-10-CM

## 2012-12-18 DIAGNOSIS — Z23 Encounter for immunization: Secondary | ICD-10-CM | POA: Diagnosis not present

## 2012-12-18 DIAGNOSIS — I498 Other specified cardiac arrhythmias: Secondary | ICD-10-CM | POA: Diagnosis not present

## 2012-12-18 DIAGNOSIS — I4729 Other ventricular tachycardia: Secondary | ICD-10-CM

## 2012-12-18 DIAGNOSIS — R0609 Other forms of dyspnea: Secondary | ICD-10-CM

## 2012-12-18 DIAGNOSIS — R42 Dizziness and giddiness: Secondary | ICD-10-CM

## 2012-12-18 DIAGNOSIS — I4589 Other specified conduction disorders: Secondary | ICD-10-CM | POA: Diagnosis not present

## 2012-12-18 NOTE — Patient Instructions (Signed)
Your physician has requested that you have an echocardiogram. Echocardiography is a painless test that uses sound waves to create images of your heart. It provides your doctor with information about the size and shape of your heart and how well your heart's chambers and valves are working. This procedure takes approximately one hour. There are no restrictions for this procedure.  Your physician recommends that you schedule a follow-up appointment in: 12 months

## 2012-12-18 NOTE — Assessment & Plan Note (Signed)
I think there is convincing evidence that Stephen Robbins has sinus node dysfunction, primarily manifesting as chronotropic incompetence. His major complaint is unsteady gait and poor balance. While a pacemaker may help with fatigue and dyspnea it would not solve these problems. It also does not sound like these are current dominant factors that limit his activity level. I would like him to followup on a yearly basis and he may benefit from pacemaker therapy in the future.

## 2012-12-18 NOTE — Progress Notes (Signed)
Patient ID: Stephen Robbins, male   DOB: Nov 11, 1935, 77 y.o.   MRN: [id number]     Reason for office visit Followup after treadmill stress test and event monitor  Stephen Robbins is feeling better. He said there was substantial improvement in his lightheadedness within 2 days of discontinuing Flomax. He still has some difficulty with balance and is afraid of falling. His treadmill stress test shows that he probably does have chronotropic incompetence. He barely achieved a heart rate of about 103 beats per minute just over 70% of the maximum predicted for his age. His event monitor mostly showed very mild sinus bradycardia with heart rates typically in the 50s during the day and in the mid 40s at night. He did not have any severe bradycardia or pauses. The one symptomatic episode that he recorded shows sinus rhythm at 66 beats per minute. Just before the period of monitoring was to end an 8 beat run of wide-complex tachycardia, almost certainly nonsustained ventricular tachycardia was recorded around midnight when he was asleep. Obviously the episode was asymptomatic. He has never had syncope. He had a stress test with nuclear perfusion imaging in the past that was normal. He does not think he has ever had an echocardiogram.    Allergies  Allergen Reactions  . Penicillins     REACTION: unspecified    Current Outpatient Prescriptions  Medication Sig Dispense Refill  . ALPRAZolam (XANAX) 0.25 MG tablet take 1 tablet by mouth three times a day if needed for anxiety  60 tablet  5  . aspirin 81 MG tablet Take 81 mg by mouth daily.        . B Complex Vitamins (B COMPLEX 50) TABS Take by mouth daily.        . benzonatate (TESSALON) 100 MG capsule Take 100 mg by mouth 3 (three) times daily as needed for cough.      . bisacodyl (DULCOLAX) 5 MG EC tablet Take 5 mg by mouth daily as needed for constipation.      . dutasteride (AVODART) 0.5 MG capsule Take 1 capsule (0.5 mg total) by mouth daily.  30 capsule  11    . finasteride (PROSCAR) 5 MG tablet Take 5 mg by mouth daily.      . fish oil-omega-3 fatty acids 1000 MG capsule Take 2 g by mouth daily.      . Glucosamine-Chondroit-Vit C-Mn (GLUCOSAMINE CHONDR 500 COMPLEX) CAPS Take 1 capsule by mouth 2 (two) times daily.    0  . hydrocortisone 0.5 % cream Apply 1 application topically 2 (two) times daily as needed. rash      . hydrocortisone-pramoxine (ANALPRAM-HC) 2.5-1 % rectal cream Place rectally as directed.  30 g  6  . ibuprofen (ADVIL,MOTRIN) 200 MG tablet Take 200 mg by mouth every 6 (six) hours as needed for pain.      Marland Kitchen levothyroxine (SYNTHROID) 50 MCG tablet Take 1 tablet (50 mcg total) by mouth daily.  90 tablet  3  . Niacin-Inositol (NIACIN FLUSH FREE) 400-100 MG CAPS Take 1 capsule by mouth 2 (two) times daily with a meal.  30 each  0  . Polyethyl Glycol-Propyl Glycol (SYSTANE ULTRA) 0.4-0.3 % SOLN Place 1 drop into both eyes daily as needed.       . sertraline (ZOLOFT) 25 MG tablet take 1 tablet by mouth once daily  90 tablet  3  . simvastatin (ZOCOR) 20 MG tablet TAKE 1 TABLET EACH EVENING  90 tablet  3  No current facility-administered medications for this visit.    Past Medical History  Diagnosis Date  . Hyperlipidemia   . Hemorrhoids   . Arthritis   . Low back pain   . Diabetes mellitus without complication   . Hypothyroidism   . BPH (benign prostatic hypertrophy)     Past Surgical History  Procedure Laterality Date  . Fx great toe    . Right hand/ index finger surgery  1978  . Tonsillectomy and adenoidectomy  AS CHILD  . Carpal tunnel release Left 02-09-2010  . Closed reduction nasal fx  09-01-2007    Family History  Problem Relation Age of Onset  . Stroke Father   . Lung cancer Mother     History   Social History  . Marital Status: Married    Spouse Name: N/A    Number of Children: 0  . Years of Education: N/A   Occupational History  . retired    Social History Main Topics  . Smoking status: Former  Smoker    Quit date: 03/25/1970  . Smokeless tobacco: Not on file  . Alcohol Use: Yes     Comment: 1 glass wine daily  . Drug Use: No  . Sexual Activity: Yes   Other Topics Concern  . Not on file   Social History Narrative   Regular exercise          Review of systems: The patient specifically denies any chest pain at rest or with exertion, dyspnea at rest or with exertion, orthopnea, paroxysmal nocturnal dyspnea, syncope, palpitations, focal neurological deficits, intermittent claudication, lower extremity edema, unexplained weight gain, cough, hemoptysis or wheezing.  The patient also denies abdominal pain, nausea, vomiting, dysphagia, diarrhea, constipation, polyuria, polydipsia, dysuria, hematuria, frequency, urgency, abnormal bleeding or bruising, fever, chills, unexpected weight changes, mood swings, change in skin or hair texture, change in voice quality, auditory or visual problems, allergic reactions or rashes, new musculoskeletal complaints other than usual "aches and pains".   PHYSICAL EXAM BP 130/70  Pulse 62  Ht 5\' 9"  (1.753 m)  Wt 177 lb 9.6 oz (80.559 kg)  BMI 26.22 kg/m2  General: Alert, oriented x3, no distress Head: no evidence of trauma, PERRL, EOMI, no exophtalmos or lid lag, no myxedema, no xanthelasma; normal ears, nose and oropharynx Neck: normal jugular venous pulsations and no hepatojugular reflux; brisk carotid pulses without delay and no carotid bruits Chest: clear to auscultation, no signs of consolidation by percussion or palpation, normal fremitus, symmetrical and full respiratory excursions Cardiovascular: normal position and quality of the apical impulse, regular rhythm, normal first and second heart sounds, no murmurs, rubs or gallops Abdomen: no tenderness or distention, no masses by palpation, no abnormal pulsatility or arterial bruits, normal bowel sounds, no hepatosplenomegaly Extremities: no clubbing, cyanosis or edema; 2+ radial, ulnar and  brachial pulses bilaterally; 2+ right femoral, posterior tibial and dorsalis pedis pulses; 2+ left femoral, posterior tibial and dorsalis pedis pulses; no subclavian or femoral bruits Neurological: grossly nonfocal   EKG: NSR  Lipid Panel     Component Value Date/Time   CHOL 117 02/27/2011 1038   TRIG 126.0 02/27/2011 1038   HDL 29.70* 02/27/2011 1038   CHOLHDL 4 02/27/2011 1038   VLDL 25.2 02/27/2011 1038   LDLCALC 62 02/27/2011 1038    BMET    Component Value Date/Time   NA 138 08/02/2012 1404   K 4.2 08/02/2012 1404   CL 102 08/02/2012 1404   CO2 27 05/25/2012 1429   GLUCOSE  195* 08/02/2012 1404   GLUCOSE 108* 02/20/2006 0838   BUN 11 08/02/2012 1404   CREATININE 0.60 08/02/2012 1404   CALCIUM 9.5 05/25/2012 1429   GFRNONAA 121.77 09/26/2009 0859   GFRAA 123 09/10/2007 0832     ASSESSMENT AND PLAN Bradycardia I think there is convincing evidence that Stephen Robbins has sinus node dysfunction, primarily manifesting as chronotropic incompetence. His major complaint is unsteady gait and poor balance. While a pacemaker may help with fatigue and dyspnea it would not solve these problems. It also does not sound like these are current dominant factors that limit his activity level. I would like him to followup on a yearly basis and he may benefit from pacemaker therapy in the future.  Dizziness, nonspecific His dizziness improved substantially after he discontinued the Flomax. He still has some balance issues but he feels much better. He is planning to undergo prostate resection next week. I did notice that he is taking both finasteride and dutasteride. He probably does not need both. I suspect both will be discontinued following surgery. I again recommended that he stay very well hydrated and eat a sodium rich diet. The only medication that he still takes that could contribute to orthostatic hypotension his sertraline, but the dose of this is very low.   Orders Placed This Encounter  Procedures  . 2D  Echocardiogram with contrast   Meds ordered this encounter  Medications  . bisacodyl (DULCOLAX) 5 MG EC tablet    Sig: Take 5 mg by mouth daily as needed for constipation.    Junious Silk, MD, [facility] [phone] office [phone] pager

## 2012-12-18 NOTE — Assessment & Plan Note (Addendum)
His dizziness improved substantially after he discontinued the Flomax. He still has some balance issues but he feels much better. He is planning to undergo prostate resection next week. I did notice that he is taking both finasteride and dutasteride. He probably does not need both. I suspect both will be discontinued following surgery. I again recommended that he stay very well hydrated and eat a sodium rich diet. The only medication that he still takes that could contribute to orthostatic hypotension his sertraline, but the dose of this is very low.

## 2012-12-22 ENCOUNTER — Encounter (HOSPITAL_BASED_OUTPATIENT_CLINIC_OR_DEPARTMENT_OTHER): Payer: Self-pay | Admitting: *Deleted

## 2012-12-25 ENCOUNTER — Encounter (HOSPITAL_BASED_OUTPATIENT_CLINIC_OR_DEPARTMENT_OTHER): Payer: Self-pay | Admitting: *Deleted

## 2012-12-25 NOTE — H&P (Signed)
History of Present Illness   Stephen Robbins returns today to reestablish as a new patient after having not been seen here for approximately 5 years. He is currently 77 years of age. He has had some very longstanding voiding issues. I originally saw him about 15 years ago for problems of both obstructive and irritative symptoms. Even at that time he was complaining of some urgency issues. Over the years, he has had a number of evaluations. In 2007, we did perform formal video urodynamics, which on pressure flow studies clearly showed that he had an obstructive component with a slow flow and very high bladder pressures. Subsequent cystoscopy revealed a relatively short prostatic urethra with minimal lateral ____ tissue, but a very high riding and prominent median bar. I thought at that time he would be an excellent candidate for a transurethral incision of the prostate if down the road that became necessary.  For quite some time he was managed with a combination of Avodart and Flomax. He tells me that over the years he has been on and off Flomax. He restarted it on a regular basis about a year ago. He subsequently has had some increased difficulty with balance and was seen and evaluated. The thought was that the Flomax may be a contributing factor and he has been off it for 2 weeks. Some of his mild unsteadiness has improved, but his urination has clearly worsened. He has considerable issues with frequency, urgency, some urge incontinence, as well as some obstructive symptoms. His current AUA symptom score is approximately 18 and he has a bothersome score of 4. PSA done in December of 2012 was less than 1.      Past Medical History Problems  1. History of  Anxiety (Symptom) 300.00 2. History of  Arthritis V13.4 3. History of  Excision Of Lesion Face 4. Former Smoker 5. History of  Heartburn 787.1 6. History of  Hypercholesterolemia 272.0 7. History of  Skin Cancer V10.83 8. History of  Thyroid Disorder  246.9  Surgical History Problems  1. History of  Nose Surgery 2. History of  Tonsillectomy  Current Meds 1. Adult Aspirin Low Strength 81 MG Oral Tablet Dispersible; Therapy: (Recorded:19Sep2014) to 2. ALPRAZolam 0.25 MG Oral Tablet; Therapy: 11Apr2014 to 3. Avodart 0.5 MG Oral Capsule; Therapy: 07Aug2014 to 4. Benzonatate 100 MG Oral Capsule; Therapy: (Recorded:19Sep2014) to 5. Fish Oil CAPS; Therapy: (Recorded:19Sep2014) to 6. Folic Acid CAPS; Therapy: (Recorded:23Sep2008) to 7. Glucosamine Chondr 500 Complex CAPS; Therapy: (Recorded:19Sep2014) to 8. HC Pramoxine CREA; Therapy: (Recorded:23Sep2008) to 9. Ibuprofen CAPS; (motrin) 200mg ; Therapy: (Recorded:23Sep2008) to 10. Levothyroxine Sodium 50 MCG Oral Tablet; Therapy: (Recorded:19Sep2014) to 11. Multi-Vitamin TABS; Therapy: (Recorded:23Sep2008) to 12. Sertraline HCl 50 MG Oral Tablet; Therapy: (Recorded:24Sep2009) to 13. Simvastatin TABS; 20mg  QD; Therapy: (Recorded:24Sep2009) to 14. Systane 0.4-0.3 % Ophthalmic Solution; Therapy: (Recorded:19Sep2014) to 15. Vitamin B Complex Oral Tablet; Therapy: (Recorded:19Sep2014) to 16. Vitamin B12 TABS; Therapy: (Recorded:19Sep2014) to  Allergies Medication  1. Penicillins  Family History Problems  1. Family history of  Death In The Family Father 10yrs, complications from stroke 2. Family history of  Death In The Family Mother 33yrs, lung cancer 3. Maternal history of  Lung Cancer V16.1 4. Paternal history of  Stroke Syndrome V17.1 Denied  5. Family history of  Family Health Status Number Of Children  Social History Problems    Alcohol Use 1-2 weekly   Caffeine Use rarely   Former Smoker V15.82 smoked for 4-27yrs, nonsmoker for the past 8yrs   Marital History -  Currently Married   Occupation: retired  Review of Systems Genitourinary, constitutional, skin, eye, otolaryngeal, hematologic/lymphatic, cardiovascular, pulmonary, endocrine, musculoskeletal,  gastrointestinal, neurological and psychiatric system(s) were reviewed and pertinent findings if present are noted.  Genitourinary: urinary frequency, feelings of urinary urgency, nocturia, incontinence and post-void dribbling, but no hematuria.  Gastrointestinal: heartburn and constipation.    Vitals Vital Signs [Data Includes: Last 1 Day]  19Sep2014 10:43AM  BMI Calculated: 27.62 BSA Calculated: 1.91 Height: 5 ft 7 in Weight: 176 lb  Blood Pressure: 127 / 71 Temperature: 98.1 F Heart Rate: 60  Physical Exam Constitutional: Well nourished and well developed . No acute distress.  ENT:. The ears and nose are normal in appearance.  Neck: The appearance of the neck is normal and no neck mass is present.  Pulmonary: No respiratory distress and normal respiratory rhythm and effort.  Cardiovascular: Heart rate and rhythm are normal . No peripheral edema.  Abdomen: The abdomen is soft and nontender. No masses are palpated. No CVA tenderness. No hernias are palpable. No hepatosplenomegaly noted.  Rectal: Rectal exam demonstrates normal sphincter tone, no tenderness and no masses. Estimated prostate size is 1+. Normal rectal tone, no rectal masses, prostate is smooth, symmetric and non-tender. The prostate has no nodularity and is not tender. The left seminal vesicle is nonpalpable. The right seminal vesicle is nonpalpable. The perineum is normal on inspection.  Skin: Normal skin turgor, no visible rash and no visible skin lesions.  Neuro/Psych:. Mood and affect are appropriate.    Results/Data Urine [Data Includes: Last 1 Day]   19Sep2014  COLOR YELLOW   APPEARANCE CLEAR   SPECIFIC GRAVITY <1.005   pH 6.0   GLUCOSE NEG mg/dL  BILIRUBIN NEG   KETONE NEG mg/dL  BLOOD NEG   PROTEIN NEG mg/dL  UROBILINOGEN 0.2 mg/dL  NITRITE NEG   LEUKOCYTE ESTERASE NEG    PVR: Ultrasound PVR 46 ml.    Assessment Assessed  1. Benign Prostatic Hypertrophy With Urinary Obstruction 600.01 2. Feelings  Of Urinary Urgency 788.63  Plan Feelings Of Urinary Urgency (788.63)  1. Follow-up Schedule Surgery Office  Follow-up  Requested for: 19Sep2014  Discussion/Summary   [name] has had some very longstanding bladder neck obstructive symptoms along with considerable bladder overactivity. His urinary frequency and urgency are likely to be related to bladder changes from the chronic outlet obstruction, but he certainly may have a component of idiopathic bladder overactivity. He has been confirmed urodynamically to have outlet obstruction. Cystoscopically this appears to be due to a very high-riding median bar with minimal lateral lobe tissue. That does make him theoretically an excellent candidate for a transurethral incision of the prostate. This is typically done in 5-10 minutes as an outpatient with about 48 hours of catheter drainage. That hopefully will eliminate the outlet obstructive symptoms and allow [place] to continue him off of alpha-blockers. This will hopefully reduce the risk of unsteadiness or fall down the road. I also think that eventually he probably can get off Avodart. I do think there is a high likelihood of success with the procedure. We did discuss it along with the recovery and some of the issues. We will try to set something up for sometime in the next several weeks. PSA has been well within normal limits. Now at age 19, ongoing routine PSA screening would no longer be indicated. His other labs look good with excellent renal function.

## 2012-12-25 NOTE — Progress Notes (Signed)
NPO AFTER MN. ARRIVES AT 0945. NEEDS ISTAT . CURRENT EKG IN EPIC AND CHART.

## 2012-12-28 ENCOUNTER — Encounter (HOSPITAL_BASED_OUTPATIENT_CLINIC_OR_DEPARTMENT_OTHER): Payer: Self-pay | Admitting: Anesthesiology

## 2012-12-28 ENCOUNTER — Ambulatory Visit (HOSPITAL_BASED_OUTPATIENT_CLINIC_OR_DEPARTMENT_OTHER)
Admission: RE | Admit: 2012-12-28 | Discharge: 2012-12-28 | Disposition: A | Discharge status: Home / Self Care | Payer: Medicare Other | Source: Ambulatory Visit | Attending: Urology | Admitting: Urology

## 2012-12-28 ENCOUNTER — Encounter (HOSPITAL_BASED_OUTPATIENT_CLINIC_OR_DEPARTMENT_OTHER)
Admission: RE | Disposition: A | Discharge status: Home / Self Care | Payer: Self-pay | Source: Ambulatory Visit | Attending: Urology

## 2012-12-28 ENCOUNTER — Ambulatory Visit (HOSPITAL_BASED_OUTPATIENT_CLINIC_OR_DEPARTMENT_OTHER): Payer: Medicare Other | Admitting: Anesthesiology

## 2012-12-28 DIAGNOSIS — Z87891 Personal history of nicotine dependence: Secondary | ICD-10-CM | POA: Insufficient documentation

## 2012-12-28 DIAGNOSIS — N139 Obstructive and reflux uropathy, unspecified: Secondary | ICD-10-CM | POA: Insufficient documentation

## 2012-12-28 DIAGNOSIS — R12 Heartburn: Secondary | ICD-10-CM | POA: Insufficient documentation

## 2012-12-28 DIAGNOSIS — Z7982 Long term (current) use of aspirin: Secondary | ICD-10-CM | POA: Diagnosis not present

## 2012-12-28 DIAGNOSIS — N138 Other obstructive and reflux uropathy: Secondary | ICD-10-CM | POA: Insufficient documentation

## 2012-12-28 DIAGNOSIS — E7800 Pure hypercholesterolemia, unspecified: Secondary | ICD-10-CM | POA: Diagnosis not present

## 2012-12-28 DIAGNOSIS — E079 Disorder of thyroid, unspecified: Secondary | ICD-10-CM | POA: Insufficient documentation

## 2012-12-28 DIAGNOSIS — N401 Enlarged prostate with lower urinary tract symptoms: Secondary | ICD-10-CM | POA: Diagnosis not present

## 2012-12-28 DIAGNOSIS — M549 Dorsalgia, unspecified: Secondary | ICD-10-CM | POA: Diagnosis not present

## 2012-12-28 DIAGNOSIS — N400 Enlarged prostate without lower urinary tract symptoms: Secondary | ICD-10-CM | POA: Diagnosis not present

## 2012-12-28 DIAGNOSIS — K219 Gastro-esophageal reflux disease without esophagitis: Secondary | ICD-10-CM | POA: Diagnosis not present

## 2012-12-28 HISTORY — DX: Bradycardia, unspecified: R00.1

## 2012-12-28 HISTORY — DX: Gastro-esophageal reflux disease without esophagitis: K21.9

## 2012-12-28 HISTORY — DX: Presence of spectacles and contact lenses: Z97.3

## 2012-12-28 HISTORY — DX: Wedge compression fracture of unspecified lumbar vertebra, initial encounter for closed fracture: S32.000A

## 2012-12-28 HISTORY — PX: CYSTOSCOPY: SHX5120

## 2012-12-28 HISTORY — DX: Hypothyroidism, unspecified: E03.9

## 2012-12-28 HISTORY — DX: Nocturia: R35.1

## 2012-12-28 HISTORY — DX: Other ventricular tachycardia: I47.29

## 2012-12-28 HISTORY — DX: Prediabetes: R73.03

## 2012-12-28 HISTORY — DX: Dizziness and giddiness: R42

## 2012-12-28 HISTORY — DX: Sick sinus syndrome: I49.5

## 2012-12-28 HISTORY — DX: Other cervical disc displacement, unspecified cervical region: M50.20

## 2012-12-28 HISTORY — PX: TRANSURETHRAL INCISION OF PROSTATE: SHX2573

## 2012-12-28 HISTORY — DX: Urgency of urination: R39.15

## 2012-12-28 HISTORY — DX: Occlusion and stenosis of bilateral carotid arteries: I65.23

## 2012-12-28 HISTORY — DX: Benign prostatic hyperplasia with lower urinary tract symptoms: N13.8

## 2012-12-28 HISTORY — DX: Frequency of micturition: R35.0

## 2012-12-28 HISTORY — DX: Ventricular tachycardia: I47.2

## 2012-12-28 HISTORY — DX: Other specified cardiac arrhythmias: I49.8

## 2012-12-28 HISTORY — DX: Other fatigue: R53.83

## 2012-12-28 HISTORY — DX: Other obstructive and reflux uropathy: N40.1

## 2012-12-28 LAB — POCT I-STAT 4, (NA,K, GLUC, HGB,HCT)
Glucose, Bld: 179 mg/dL — ABNORMAL HIGH (ref 70–99)
HCT: 44 % (ref 39.0–52.0)
Hemoglobin: 15 g/dL (ref 13.0–17.0)
Potassium: 3.9 mEq/L (ref 3.5–5.1)
Sodium: 139 mEq/L (ref 135–145)

## 2012-12-28 SURGERY — CYSTOSCOPY
Anesthesia: General | Site: Prostate | Wound class: Clean Contaminated

## 2012-12-28 MED ORDER — ONDANSETRON HCL 4 MG/2ML IJ SOLN
INTRAMUSCULAR | Status: DC | PRN
  Administered 2012-12-28: 4 mg via INTRAMUSCULAR

## 2012-12-28 MED ORDER — KETOROLAC TROMETHAMINE 30 MG/ML IJ SOLN
INTRAMUSCULAR | Status: DC | PRN
  Administered 2012-12-28: 15 mg via INTRAVENOUS

## 2012-12-28 MED ORDER — FENTANYL CITRATE 0.05 MG/ML IJ SOLN
INTRAMUSCULAR | Status: DC | PRN
  Administered 2012-12-28 (×4): 25 ug via INTRAVENOUS

## 2012-12-28 MED ORDER — LACTATED RINGERS IV SOLN
INTRAVENOUS | Status: DC
  Administered 2012-12-28 (×2): via INTRAVENOUS
  Filled 2012-12-28: qty 1000

## 2012-12-28 MED ORDER — SODIUM CHLORIDE 0.9 % IR SOLN
Status: DC | PRN
  Administered 2012-12-28: 6000 mL

## 2012-12-28 MED ORDER — CIPROFLOXACIN HCL 250 MG PO TABS: 250.0000 mg | ORAL_TABLET | Freq: Two times a day (BID) | ORAL | Status: DC

## 2012-12-28 MED ORDER — DEXAMETHASONE SODIUM PHOSPHATE 4 MG/ML IJ SOLN
INTRAMUSCULAR | Status: DC | PRN
  Administered 2012-12-28: 4 mg via INTRAVENOUS

## 2012-12-28 MED ORDER — LIDOCAINE HCL (CARDIAC) 20 MG/ML IV SOLN
INTRAVENOUS | Status: DC | PRN
  Administered 2012-12-28: 60 mg via INTRAVENOUS

## 2012-12-28 MED ORDER — DEXAMETHASONE SODIUM PHOSPHATE 4 MG/ML IJ SOLN: INTRAMUSCULAR | Status: DC | PRN

## 2012-12-28 MED ORDER — CIPROFLOXACIN IN D5W 400 MG/200ML IV SOLN
400.0000 mg | INTRAVENOUS | Status: AC
  Administered 2012-12-28: 400 mg via INTRAVENOUS
  Filled 2012-12-28: qty 200

## 2012-12-28 MED ORDER — HYDROCODONE-ACETAMINOPHEN 5-325 MG PO TABS: 1.0000 | ORAL_TABLET | Freq: Four times a day (QID) | ORAL | Status: DC | PRN

## 2012-12-28 MED ORDER — LIDOCAINE HCL 2 % EX GEL
CUTANEOUS | Status: DC | PRN
  Administered 2012-12-28: 1

## 2012-12-28 MED ORDER — LACTATED RINGERS IV SOLN
INTRAVENOUS | Status: DC | PRN
  Administered 2012-12-28: 10:00:00 via INTRAVENOUS

## 2012-12-28 MED ORDER — PROPOFOL 10 MG/ML IV BOLUS
INTRAVENOUS | Status: DC | PRN
  Administered 2012-12-28: 40 mg via INTRAVENOUS
  Administered 2012-12-28: 160 mg via INTRAVENOUS

## 2012-12-28 MED ORDER — BELLADONNA ALKALOIDS-OPIUM 16.2-60 MG RE SUPP
RECTAL | Status: DC | PRN
  Administered 2012-12-28: 1 via RECTAL

## 2012-12-28 SURGICAL SUPPLY — 40 items
BAG DRAIN URO-CYSTO SKYTR STRL (DRAIN) ×3 IMPLANT
BAG URINE DRAINAGE (UROLOGICAL SUPPLIES) IMPLANT
BAG URINE LEG 19OZ MD ST LTX (BAG) ×3 IMPLANT
CANISTER SUCT LVC 12 LTR MEDI- (MISCELLANEOUS) ×3 IMPLANT
CATH FOLEY 2WAY SLVR  5CC 20FR (CATHETERS)
CATH FOLEY 2WAY SLVR  5CC 22FR (CATHETERS)
CATH FOLEY 2WAY SLVR 30CC 22FR (CATHETERS) IMPLANT
CATH FOLEY 2WAY SLVR 5CC 20FR (CATHETERS) IMPLANT
CATH FOLEY 2WAY SLVR 5CC 22FR (CATHETERS) IMPLANT
CATH ROBINSON RED A/P 14FR (CATHETERS) IMPLANT
CATH ROBINSON RED A/P 16FR (CATHETERS) IMPLANT
CLOTH BEACON ORANGE TIMEOUT ST (SAFETY) IMPLANT
DRAPE CAMERA CLOSED 9X96 (DRAPES) ×3 IMPLANT
ELECT BUTTON BIOP 24F 90D PLAS (MISCELLANEOUS) IMPLANT
ELECT HF RESECT BIPO 24F 45 ND (CUTTING LOOP) ×3 IMPLANT
ELECT LOOP HF 26F 30D .35MM (CUTTING LOOP) IMPLANT
ELECT LOOP MED HF 24F 12D CBL (CLIP) ×3 IMPLANT
ELECT NEEDLE 45D HF 24-28F 12D (CUTTING LOOP) IMPLANT
ELECT REM PT RETURN 9FT ADLT (ELECTROSURGICAL)
ELECTRODE REM PT RTRN 9FT ADLT (ELECTROSURGICAL) IMPLANT
EVACUATOR MICROVAS BLADDER (UROLOGICAL SUPPLIES) IMPLANT
GLOVE BIO SURGEON STRL SZ7 (GLOVE) ×3 IMPLANT
GLOVE BIO SURGEON STRL SZ7.5 (GLOVE) ×3 IMPLANT
GLOVE INDICATOR 7.5 STRL GRN (GLOVE) ×3 IMPLANT
GOWN PREVENTION PLUS LG XLONG (DISPOSABLE) ×3 IMPLANT
GOWN STRL NON-REIN LRG LVL3 (GOWN DISPOSABLE) ×3 IMPLANT
GOWN STRL REIN XL XLG (GOWN DISPOSABLE) ×3 IMPLANT
HOLDER FOLEY CATH W/STRAP (MISCELLANEOUS) IMPLANT
IV NS IRRIG 3000ML ARTHROMATIC (IV SOLUTION) ×6 IMPLANT
KIT ASPIRATION TUBING (SET/KITS/TRAYS/PACK) IMPLANT
LOOP CUTTING 24FR OLYMPUS (CUTTING LOOP) IMPLANT
NDL SAFETY ECLIPSE 18X1.5 (NEEDLE) IMPLANT
NEEDLE HYPO 18GX1.5 SHARP (NEEDLE)
NEEDLE HYPO 22GX1.5 SAFETY (NEEDLE) IMPLANT
NS IRRIG 500ML POUR BTL (IV SOLUTION) IMPLANT
PACK CYSTOSCOPY (CUSTOM PROCEDURE TRAY) ×3 IMPLANT
PLUG CATH AND CAP STER (CATHETERS) IMPLANT
SYR 20CC LL (SYRINGE) IMPLANT
SYR BULB IRRIGATION 50ML (SYRINGE) IMPLANT
WATER STERILE IRR 3000ML UROMA (IV SOLUTION) IMPLANT

## 2012-12-28 NOTE — Anesthesia Preprocedure Evaluation (Addendum)
Anesthesia Evaluation  Patient identified by MRN, date of birth, ID band Patient awake    Reviewed: Allergy & Precautions, H&P , NPO status , Patient's Chart, lab work & pertinent test results  Airway Mallampati: II TM Distance: >3 FB Neck ROM: Full    Dental no notable dental hx.    Pulmonary neg pulmonary ROS,  CXR: 12-27-11: minimal central peribronchial thickening. breath sounds clear to auscultation  Pulmonary exam normal       Cardiovascular + Peripheral Vascular Disease + dysrhythmias Rhythm:Regular Rate:Normal  H/O nonsustained ventricular tachycardia.  H/O bradycardia. H/O chronotropic incompetence with sinus node dysfunction.  Asymptomatic bilateral carotid artery stenosis.  ECG: SB with LAFB  Evaluated by Dr. Royann Shivers 12-18-12   Neuro/Psych PSYCHIATRIC DISORDERS Depression  Neuromuscular disease    GI/Hepatic Neg liver ROS, GERD-  ,  Endo/Other  Hypothyroidism Borderline diabetes.  Renal/GU negative Renal ROS  negative genitourinary   Musculoskeletal negative musculoskeletal ROS (+)   Abdominal   Peds negative pediatric ROS (+)  Hematology negative hematology ROS (+)   Anesthesia Other Findings   Reproductive/Obstetrics negative OB ROS                         Anesthesia Physical Anesthesia Plan  ASA: III  Anesthesia Plan: General   Post-op Pain Management:    Induction: Intravenous  Airway Management Planned: LMA  Additional Equipment:   Intra-op Plan:   Post-operative Plan: Extubation in OR  Informed Consent: I have reviewed the patients History and Physical, chart, labs and discussed the procedure including the risks, benefits and alternatives for the proposed anesthesia with the patient or authorized representative who has indicated his/her understanding and acceptance.   Dental advisory given  Plan Discussed with: CRNA  Anesthesia Plan Comments:          Anesthesia Quick Evaluation

## 2012-12-28 NOTE — Anesthesia Procedure Notes (Signed)
Procedure Name: LMA Insertion Date/Time: 12/28/2012 11:05 AM Performed by: Jessica Priest Pre-anesthesia Checklist: Patient identified, Emergency Drugs available, Suction available and Patient being monitored Patient Re-evaluated:Patient Re-evaluated prior to inductionOxygen Delivery Method: Circle System Utilized Preoxygenation: Pre-oxygenation with 100% oxygen Intubation Type: IV induction Ventilation: Mask ventilation without difficulty LMA: LMA inserted LMA Size: 5.0 Number of attempts: 1 Airway Equipment and Method: bite block Placement Confirmation: positive ETCO2 Tube secured with: Tape Dental Injury: Teeth and Oropharynx as per pre-operative assessment  Comments: Induction smooth Sao2 97-98 % easy to mask ventilate, placed # 4 LMA, leak positive, repositioned, removed inserted # 5 for better seal. BBS equal good exchange , Dr Marjo Bicker at bedside.

## 2012-12-28 NOTE — Interval H&P Note (Signed)
History and Physical Interval Note:  12/28/2012 10:44 AM  Stephen Robbins  has presented today for surgery, with the diagnosis of Benign Prostatic Hypertrophy  The various methods of treatment have been discussed with the patient and family. After consideration of risks, benefits and other options for treatment, the patient has consented to  Procedure(s) with comments: CYSTOSCOPY (N/A) - 30 mins req for this case TRANSURETHRAL INCISION OF THE PROSTATE (TUIP) (N/A) as a surgical intervention .  The patient's history has been reviewed, patient examined, no change in status, stable for surgery.  I have reviewed the patient's chart and labs.  Questions were answered to the patient's satisfaction.     Loomis Anacker S

## 2012-12-28 NOTE — Op Note (Signed)
Preoperative diagnosis: BPH Postoperative diagnosis: Same  Procedure: Cystoscopy, TUIP   Surgeon: Valetta Fuller M.D.  Anesthesia: Gen.  Indications: Stephen Robbins is 77 years of age. He has had long-standing outlet obstruction. He is felt to have a relatively small prostate but a high riding and prominent median bar. He has done well without blocker therapy but as it had increased dizziness thought to be at least partially secondary to alpha-blocker. With discontinuation of Stephen Robbins his voiding has worsened and recent AUA symptom score was 18 with a bothersome score of 4. We talked about the procedural options and thought it is an excellent candidate for TUIP which would hopefully allow him to stay off alpha-blocker therapy but have excellent improvement in his overall voiding. The [place] and disadvantages of this were discussed with him at length. The patient is had perioperative antibiotics and placement of PAS compression boots.     Technique and findings: Patient brought the operating room where he had successful induction general anesthesia. Placed in lithotomy position prepped and draped in usual manner. Appropriate surgical timeout was performed. Cystoscopy again revealed a 3-1/2 cm prostatic urethra with relatively minimal lateral lobes and a fairly high riding and prominent median bar. Bladder showed some trabecular change with some cellules. We went ahead and used a 20 [place] continuous resectoscope sheath with a Collings knife. We performed a transurethral incision of the prostate from between the 2 ureteral orifices through the bladder neck and out to the vera montanum. With this there was excellent improvement in the visual obstruction at the level of the bladder neck. Hemostasis was quite good a 20 [place] Foley catheter was placed. The patient was brought to recovery in stable condition having had no obvious complications or problems.

## 2012-12-28 NOTE — Transfer of Care (Signed)
Immediate Anesthesia Transfer of Care Note  Patient: Stephen Robbins  Procedure(s) Performed: Procedure(s) (LRB): CYSTOSCOPY (N/A) TRANSURETHRAL INCISION OF THE PROSTATE (TUIP) (N/A)  Patient Location: PACU  Anesthesia Type: General  Level of Consciousness: awake, sedated, patient cooperative and responds to stimulation  Airway & Oxygen Therapy: Patient Spontanous Breathing and Patient connected to face mask oxygen  Post-op Assessment: Report given to PACU RN, Post -op Vital signs reviewed and stable and Patient moving all extremities  Post vital signs: Reviewed and stable  Complications: No apparent anesthesia complications

## 2012-12-29 ENCOUNTER — Encounter: Payer: Self-pay | Admitting: Cardiovascular Disease

## 2012-12-29 ENCOUNTER — Encounter (HOSPITAL_BASED_OUTPATIENT_CLINIC_OR_DEPARTMENT_OTHER): Payer: Self-pay | Admitting: Urology

## 2012-12-30 ENCOUNTER — Ambulatory Visit: Payer: Medicare Other | Admitting: Internal Medicine

## 2013-01-01 NOTE — Anesthesia Postprocedure Evaluation (Signed)
  Anesthesia Post-op Note  Patient: Stephen Robbins  Procedure(s) Performed: Procedure(s) (LRB): CYSTOSCOPY (N/A) TRANSURETHRAL INCISION OF THE PROSTATE (TUIP) (N/A)  Patient Location: PACU  Anesthesia Type: General  Level of Consciousness: awake and alert   Airway and Oxygen Therapy: Patient Spontanous Breathing  Post-op Pain: mild  Post-op Assessment: Post-op Vital signs reviewed, Patient's Cardiovascular Status Stable, Respiratory Function Stable, Patent Airway and No signs of Nausea or vomiting  Last Vitals:  Filed Vitals:   12/28/12 1338  BP: 170/81  Pulse: 54  Temp: 36.3 C  Resp: 18    Post-op Vital Signs: stable   Complications: No apparent anesthesia complications

## 2013-01-04 ENCOUNTER — Encounter: Payer: Self-pay | Admitting: Internal Medicine

## 2013-01-04 ENCOUNTER — Ambulatory Visit (INDEPENDENT_AMBULATORY_CARE_PROVIDER_SITE_OTHER): Payer: Medicare Other | Admitting: Internal Medicine

## 2013-01-04 VITALS — BP 138/70 | HR 72 | Temp 98.0°F | Resp 16 | Ht 69.0 in | Wt 170.0 lb

## 2013-01-04 DIAGNOSIS — N401 Enlarged prostate with lower urinary tract symptoms: Secondary | ICD-10-CM | POA: Diagnosis not present

## 2013-01-04 DIAGNOSIS — K649 Unspecified hemorrhoids: Secondary | ICD-10-CM | POA: Diagnosis not present

## 2013-01-04 DIAGNOSIS — N138 Other obstructive and reflux uropathy: Secondary | ICD-10-CM | POA: Diagnosis not present

## 2013-01-04 DIAGNOSIS — IMO0001 Reserved for inherently not codable concepts without codable children: Secondary | ICD-10-CM | POA: Diagnosis not present

## 2013-01-04 DIAGNOSIS — R339 Retention of urine, unspecified: Secondary | ICD-10-CM | POA: Diagnosis not present

## 2013-01-04 MED ORDER — CANAGLIFLOZIN 100 MG PO TABS: 1.0000 | ORAL_TABLET | Freq: Every day | ORAL | Status: DC

## 2013-01-04 MED ORDER — HYDROCORTISONE ACE-PRAMOXINE 2.5-1 % RE CREA: TOPICAL_CREAM | RECTAL | Status: DC

## 2013-01-04 MED ORDER — FOLIC ACID 1 MG PO TABS: 1.0000 mg | ORAL_TABLET | Freq: Every day | ORAL | Status: DC

## 2013-01-04 MED ORDER — DUTASTERIDE 0.5 MG PO CAPS: 0.5000 mg | ORAL_CAPSULE | Freq: Every evening | ORAL | Status: DC

## 2013-01-04 NOTE — Patient Instructions (Addendum)
Ask urology about cialis 5 mg daily

## 2013-01-04 NOTE — Progress Notes (Signed)
  Subjective:    Patient ID: Stephen Robbins, male    DOB: 1935-11-24, 77 y.o.   MRN: [id number]  HPI    Review of Systems     Objective:   Physical Exam        Assessment & Plan:  discussion of the echo

## 2013-01-04 NOTE — Progress Notes (Signed)
Subjective:    Patient ID: Stephen Robbins, male    DOB: 11-05-1935, 77 y.o.   MRN: [id number]  HPI TURP at [facility] Has not noted improvement yet and has follow up with Grapy Still has incontinence Has plans to stop the avodart Had a brief run of SVT The Flomax was felt to be  A contributor to the arrythmia? Fall risk moderate      Review of Systems  Constitutional: Negative for fever and fatigue.  HENT: Negative for congestion, hearing loss and postnasal drip.   Eyes: Negative for discharge, redness and visual disturbance.  Respiratory: Negative for cough, shortness of breath and wheezing.   Cardiovascular: Negative for leg swelling.  Gastrointestinal: Negative for abdominal pain, constipation and abdominal distention.  Genitourinary: Negative for urgency and frequency.  Musculoskeletal: Negative for arthralgias, joint swelling and neck pain.  Skin: Negative for color change and rash.  Neurological: Negative for weakness and light-headedness.  Hematological: Negative for adenopathy.  Psychiatric/Behavioral: Negative for behavioral problems.   Past Medical History  Diagnosis Date  . Hyperlipidemia   . Hemorrhoids   . Arthritis   . Low back pain   . Hypothyroidism   . Asymptomatic bilateral carotid artery stenosis     per duplex  05-15-2012  left >39%/   right 40-59%  . Bradycardia   . Fatigue   . Dizzy   . BPH (benign prostatic hypertrophy) with urinary obstruction   . NSVT (nonsustained ventricular tachycardia)     cardiologist-  dr croitoru  . Chronotropic incompetence with sinus node dysfunction   . Borderline diabetes   . GERD (gastroesophageal reflux disease)     occasional  . Compression of lumbar vertebra     L4 -- L5  . Compressed cervical disc   . History of basal cell carcinoma excision   . Frequency of urination   . Urgency of urination   . Nocturia   . Wears glasses     History   Social History  . Marital Status: Married    Spouse Name: N/A     Number of Children: 0  . Years of Education: N/A   Occupational History  . retired    Social History Main Topics  . Smoking status: Former Smoker    Types: Cigarettes    Quit date: 03/25/1968  . Smokeless tobacco: Never Used  . Alcohol Use: 4.2 oz/week    7 Glasses of wine per week     Comment: 1 glass wine daily  . Drug Use: No  . Sexual Activity: Not on file   Other Topics Concern  . Not on file   Social History Narrative   Regular exercise          Past Surgical History  Procedure Laterality Date  . Carpal tunnel release Left 02-09-2010    ULNAR NERVE RELEASE  . Closed reduction nasal fx  09-01-2007  . Exercise tolerence test  12-03-2012  DR CROITURO    CHRONOTROPIC INCOMPETENCE/ NORMAL RESTING BP W/ APPROPRIATE RESPONSE/ NO CHEST PAIN/ NO ST CHANGES FROM BASELINE  . Right middle finger surgery  1978  . Tonsillectomy and adenoidectomy  AGE 66  . Cystoscopy N/A 12/28/2012    Procedure: CYSTOSCOPY;  Surgeon: Valetta Fuller, MD;  Location: [facility];  Service: Urology;  Laterality: N/A;  . Transurethral incision of prostate N/A 12/28/2012    Procedure: TRANSURETHRAL INCISION OF THE PROSTATE (TUIP);  Surgeon: Valetta Fuller, MD;  Location: [facility];  Service: Urology;  Laterality: N/A;    Family History  Problem Relation Age of Onset  . Stroke Father   . Lung cancer Mother     Allergies  Allergen Reactions  . Penicillins Other (See Comments)    Unknown childhood reaction    Current Outpatient Prescriptions on File Prior to Visit  Medication Sig Dispense Refill  . ALPRAZolam (XANAX) 0.25 MG tablet       . B Complex Vitamins (B COMPLEX 50) TABS Take 1 tablet by mouth every morning.       . benzonatate (TESSALON) 100 MG capsule Take 100 mg by mouth 3 (three) times daily as needed for cough.      . bisacodyl (DULCOLAX) 5 MG EC tablet Take 5 mg by mouth daily.       . ciprofloxacin (CIPRO) 250 MG tablet Take 1 tablet (250 mg  total) by mouth 2 (two) times daily.  10 tablet  0  . dutasteride (AVODART) 0.5 MG capsule Take 0.5 mg by mouth every evening.      . finasteride (PROSCAR) 5 MG tablet Take 5 mg by mouth every evening.       . Glucosamine-Chondroit-Vit C-Mn (GLUCOSAMINE CHONDR 500 COMPLEX) CAPS Take 1 capsule by mouth 2 (two) times daily.    0  . hydrocortisone 0.5 % cream Apply 1 application topically 2 (two) times daily as needed. rash      . hydrocortisone-pramoxine (ANALPRAM-HC) 2.5-1 % rectal cream Place rectally as directed.  30 g  6  . ibuprofen (ADVIL,MOTRIN) 200 MG tablet Take 200 mg by mouth every 6 (six) hours as needed for pain.      Marland Kitchen levothyroxine (SYNTHROID, LEVOTHROID) 50 MCG tablet Take 50 mcg by mouth daily before breakfast.      . Niacin-Inositol (NIACIN FLUSH FREE) 400-100 MG CAPS Take 1 capsule by mouth 2 (two) times daily with a meal.  30 each  0  . Polyethyl Glycol-Propyl Glycol (SYSTANE ULTRA) 0.4-0.3 % SOLN Place 1 drop into both eyes daily as needed.       . sertraline (ZOLOFT) 25 MG tablet       . simvastatin (ZOCOR) 20 MG tablet TAKE 1 TABLET EACH EVENING  90 tablet  3  . vitamin B-12 (CYANOCOBALAMIN) 1000 MCG tablet Take 1,000 mcg by mouth daily.       No current facility-administered medications on file prior to visit.    BP 138/70  Pulse 72  Temp(Src) 98 F (36.7 C)  Resp 16  Ht 5\' 9"  (1.753 m)  Wt 170 lb (77.111 kg)  BMI 25.09 kg/m2       Objective:   Physical Exam  Nursing note and vitals reviewed. Constitutional: He appears well-developed and well-nourished.  HENT:  Head: Normocephalic and atraumatic.  Eyes: Conjunctivae are normal. Pupils are equal, round, and reactive to light.  Neck: Normal range of motion. Neck supple.  Cardiovascular: Normal rate and regular rhythm.   Pulmonary/Chest: Effort normal and breath sounds normal.  Abdominal: Soft. Bowel sounds are normal.          Assessment & Plan:  Balance exercise Squats and balance training Lower  weight and increased reps Diet discussed Niacin and zocar Refills given Add Innvokana for DM

## 2013-01-06 ENCOUNTER — Ambulatory Visit (HOSPITAL_COMMUNITY)
Admission: RE | Admit: 2013-01-06 | Discharge: 2013-01-06 | Disposition: A | Discharge status: Home / Self Care | Payer: Medicare Other | Source: Ambulatory Visit | Attending: Cardiology | Admitting: Cardiology

## 2013-01-06 DIAGNOSIS — R0989 Other specified symptoms and signs involving the circulatory and respiratory systems: Secondary | ICD-10-CM | POA: Insufficient documentation

## 2013-01-06 DIAGNOSIS — R0609 Other forms of dyspnea: Secondary | ICD-10-CM | POA: Diagnosis not present

## 2013-01-06 DIAGNOSIS — I472 Ventricular tachycardia: Secondary | ICD-10-CM

## 2013-01-06 DIAGNOSIS — R0600 Dyspnea, unspecified: Secondary | ICD-10-CM

## 2013-01-06 DIAGNOSIS — I4589 Other specified conduction disorders: Secondary | ICD-10-CM

## 2013-01-06 NOTE — Progress Notes (Signed)
2D Echo Performed 01/06/2013    Tydus Sanmiguel, RCS  

## 2013-01-12 ENCOUNTER — Encounter: Payer: Self-pay | Admitting: Cardiovascular Disease

## 2013-02-03 DIAGNOSIS — E291 Testicular hypofunction: Secondary | ICD-10-CM | POA: Diagnosis not present

## 2013-03-08 ENCOUNTER — Encounter: Payer: Self-pay | Admitting: Internal Medicine

## 2013-03-09 ENCOUNTER — Other Ambulatory Visit: Payer: Self-pay | Admitting: *Deleted

## 2013-03-09 DIAGNOSIS — IMO0001 Reserved for inherently not codable concepts without codable children: Secondary | ICD-10-CM

## 2013-03-09 MED ORDER — FINASTERIDE 5 MG PO TABS: 5.0000 mg | ORAL_TABLET | Freq: Every day | ORAL | Status: DC

## 2013-03-09 MED ORDER — LEVOTHYROXINE SODIUM 50 MCG PO TABS: 50.0000 ug | ORAL_TABLET | Freq: Every day | ORAL | Status: DC

## 2013-03-09 MED ORDER — SIMVASTATIN 20 MG PO TABS: ORAL_TABLET | ORAL | Status: DC

## 2013-03-09 MED ORDER — CANAGLIFLOZIN 100 MG PO TABS: 1.0000 | ORAL_TABLET | Freq: Every day | ORAL | Status: DC

## 2013-03-10 ENCOUNTER — Other Ambulatory Visit: Payer: Self-pay | Admitting: *Deleted

## 2013-03-15 ENCOUNTER — Other Ambulatory Visit: Payer: Self-pay | Admitting: *Deleted

## 2013-03-15 DIAGNOSIS — IMO0001 Reserved for inherently not codable concepts without codable children: Secondary | ICD-10-CM

## 2013-03-15 MED ORDER — CANAGLIFLOZIN 100 MG PO TABS: 1.0000 | ORAL_TABLET | Freq: Every day | ORAL | Status: DC

## 2013-03-22 ENCOUNTER — Other Ambulatory Visit: Payer: Self-pay | Admitting: Internal Medicine

## 2013-04-27 ENCOUNTER — Other Ambulatory Visit: Payer: Self-pay | Admitting: Dermatology

## 2013-04-27 DIAGNOSIS — Z85828 Personal history of other malignant neoplasm of skin: Secondary | ICD-10-CM | POA: Diagnosis not present

## 2013-04-27 DIAGNOSIS — L570 Actinic keratosis: Secondary | ICD-10-CM | POA: Diagnosis not present

## 2013-04-27 DIAGNOSIS — D485 Neoplasm of uncertain behavior of skin: Secondary | ICD-10-CM | POA: Diagnosis not present

## 2013-04-27 DIAGNOSIS — D1801 Hemangioma of skin and subcutaneous tissue: Secondary | ICD-10-CM | POA: Diagnosis not present

## 2013-05-14 DIAGNOSIS — E119 Type 2 diabetes mellitus without complications: Secondary | ICD-10-CM | POA: Diagnosis not present

## 2013-05-14 DIAGNOSIS — N401 Enlarged prostate with lower urinary tract symptoms: Secondary | ICD-10-CM | POA: Diagnosis not present

## 2013-05-14 DIAGNOSIS — N138 Other obstructive and reflux uropathy: Secondary | ICD-10-CM | POA: Diagnosis not present

## 2013-05-14 DIAGNOSIS — R3915 Urgency of urination: Secondary | ICD-10-CM | POA: Diagnosis not present

## 2013-05-14 DIAGNOSIS — N139 Obstructive and reflux uropathy, unspecified: Secondary | ICD-10-CM | POA: Diagnosis not present

## 2013-05-17 ENCOUNTER — Ambulatory Visit: Payer: Medicare Other | Admitting: Internal Medicine

## 2013-05-31 ENCOUNTER — Ambulatory Visit (INDEPENDENT_AMBULATORY_CARE_PROVIDER_SITE_OTHER): Payer: Medicare Other | Admitting: Internal Medicine

## 2013-05-31 ENCOUNTER — Encounter: Payer: Self-pay | Admitting: Internal Medicine

## 2013-05-31 VITALS — BP 132/74 | HR 60 | Temp 97.9°F | Ht 69.0 in | Wt 172.0 lb

## 2013-05-31 DIAGNOSIS — IMO0002 Reserved for concepts with insufficient information to code with codable children: Secondary | ICD-10-CM | POA: Diagnosis not present

## 2013-05-31 DIAGNOSIS — E1165 Type 2 diabetes mellitus with hyperglycemia: Secondary | ICD-10-CM | POA: Diagnosis not present

## 2013-05-31 DIAGNOSIS — G251 Drug-induced tremor: Secondary | ICD-10-CM

## 2013-05-31 DIAGNOSIS — G252 Other specified forms of tremor: Secondary | ICD-10-CM

## 2013-05-31 DIAGNOSIS — G250 Essential tremor: Secondary | ICD-10-CM | POA: Diagnosis not present

## 2013-05-31 DIAGNOSIS — E291 Testicular hypofunction: Secondary | ICD-10-CM | POA: Diagnosis not present

## 2013-05-31 DIAGNOSIS — E1169 Type 2 diabetes mellitus with other specified complication: Principal | ICD-10-CM

## 2013-05-31 DIAGNOSIS — E669 Obesity, unspecified: Secondary | ICD-10-CM

## 2013-05-31 MED ORDER — TESTOSTERONE 30 MG/ACT TD SOLN: 2.0000 | Freq: Every day | TRANSDERMAL | Status: DC

## 2013-05-31 NOTE — Patient Instructions (Signed)
Initial A1c was 9.4 at diagnosis. Now 6.5 which is a significant improvement.   We will increase the Invokanna to 300 mg  Because of the nature of the drug and will be glucose in your urine This drug causes you to waste glucose by not absorbing it in the proximal tubules

## 2013-05-31 NOTE — Progress Notes (Signed)
Pre visit review using our clinic review tool, if applicable. No additional management support is needed unless otherwise documented below in the visit note. 

## 2013-05-31 NOTE — Progress Notes (Signed)
Subjective:    Patient ID: Stephen Robbins, male    DOB: 31-May-1935, 78 y.o.   MRN: [id number]  HPI Mood and memory loss Hypothyroidism Chronic back pain  DM follow up for trial of new medicaiton Has been on invokana Admits to poor diet choices     Review of Systems  Constitutional: Negative for fever and fatigue.  HENT: Negative for congestion, hearing loss and postnasal drip.   Eyes: Negative for discharge, redness and visual disturbance.  Respiratory: Negative for cough, shortness of breath and wheezing.   Cardiovascular: Negative for leg swelling.  Gastrointestinal: Negative for abdominal pain, constipation and abdominal distention.  Genitourinary: Positive for hematuria. Negative for urgency and frequency.  Musculoskeletal: Negative for arthralgias, joint swelling and neck pain.  Skin: Negative for color change and rash.  Neurological: Negative for weakness and light-headedness.  Hematological: Negative for adenopathy.  Psychiatric/Behavioral: Negative for behavioral problems.   Past Medical History  Diagnosis Date  . Hyperlipidemia   . Hemorrhoids   . Arthritis   . Low back pain   . Hypothyroidism   . Asymptomatic bilateral carotid artery stenosis     per duplex  05-15-2012  left >39%/   right 40-59%  . Bradycardia   . Fatigue   . Dizzy   . BPH (benign prostatic hypertrophy) with urinary obstruction   . NSVT (nonsustained ventricular tachycardia)     cardiologist-  dr croitoru  . Chronotropic incompetence with sinus node dysfunction   . Borderline diabetes   . GERD (gastroesophageal reflux disease)     occasional  . Compression of lumbar vertebra     L4 -- L5  . Compressed cervical disc   . History of basal cell carcinoma excision   . Frequency of urination   . Urgency of urination   . Nocturia   . Wears glasses     History   Social History  . Marital Status: Married    Spouse Name: N/A    Number of Children: 0  . Years of Education: N/A    Occupational History  . retired    Social History Main Topics  . Smoking status: Former Smoker    Types: Cigarettes    Quit date: 03/25/1968  . Smokeless tobacco: Never Used  . Alcohol Use: 4.2 oz/week    7 Glasses of wine per week     Comment: 1 glass wine daily  . Drug Use: No  . Sexual Activity: Not on file   Other Topics Concern  . Not on file   Social History Narrative   Regular exercise          Past Surgical History  Procedure Laterality Date  . Carpal tunnel release Left 02-09-2010    ULNAR NERVE RELEASE  . Closed reduction nasal fx  09-01-2007  . Exercise tolerence test  12-03-2012  DR CROITURO    CHRONOTROPIC INCOMPETENCE/ NORMAL RESTING BP W/ APPROPRIATE RESPONSE/ NO CHEST PAIN/ NO ST CHANGES FROM BASELINE  . Right middle finger surgery  1978  . Tonsillectomy and adenoidectomy  AGE 55  . Cystoscopy N/A 12/28/2012    Procedure: CYSTOSCOPY;  Surgeon: Bernestine Amass, MD;  Location: [facility];  Service: Urology;  Laterality: N/A;  . Transurethral incision of prostate N/A 12/28/2012    Procedure: TRANSURETHRAL INCISION OF THE PROSTATE (TUIP);  Surgeon: Bernestine Amass, MD;  Location: [facility];  Service: Urology;  Laterality: N/A;    Family History  Problem Relation Age of  Onset  . Stroke Father   . Lung cancer Mother     Allergies  Allergen Reactions  . Penicillins Other (See Comments)    Unknown childhood reaction    Current Outpatient Prescriptions on File Prior to Visit  Medication Sig Dispense Refill  . ALPRAZolam (XANAX) 0.25 MG tablet       . aspirin 81 MG tablet Take 81 mg by mouth daily.      . B Complex Vitamins (B COMPLEX 50) TABS Take 1 tablet by mouth every morning.       . benzonatate (TESSALON) 100 MG capsule Take 100 mg by mouth 3 (three) times daily as needed for cough.      . bisacodyl (DULCOLAX) 5 MG EC tablet Take 5 mg by mouth daily.       . Canagliflozin (INVOKANA) 100 MG TABS Take 1 tablet (100 mg  total) by mouth daily.  30 tablet  11  . fish oil-omega-3 fatty acids 1000 MG capsule Take 2 g by mouth daily.      . folic acid (FOLVITE) 1 MG tablet Take 1 tablet (1 mg total) by mouth daily.  90 tablet  3  . Glucosamine-Chondroit-Vit C-Mn (GLUCOSAMINE CHONDR 500 COMPLEX) CAPS Take 1 capsule by mouth 2 (two) times daily.    0  . hydrocortisone 0.5 % cream Apply 1 application topically 2 (two) times daily as needed. rash      . hydrocortisone-pramoxine (ANALPRAM-HC) 2.5-1 % rectal cream Place rectally as directed.  30 g  6  . ibuprofen (ADVIL,MOTRIN) 200 MG tablet Take 200 mg by mouth every 6 (six) hours as needed for pain.      Marland Kitchen levothyroxine (SYNTHROID, LEVOTHROID) 50 MCG tablet Take 1 tablet (50 mcg total) by mouth daily before breakfast.  90 tablet  3  . Polyethyl Glycol-Propyl Glycol (SYSTANE ULTRA) 0.4-0.3 % SOLN Place 1 drop into both eyes daily as needed.       . sertraline (ZOLOFT) 25 MG tablet       . simvastatin (ZOCOR) 20 MG tablet TAKE 1 TABLET EACH EVENING  90 tablet  3  . Testosterone (AXIRON) 30 MG/ACT SOLN Place 2 Applicatorfuls onto the skin daily. Dr Risa Grill ordered      . vitamin B-12 (CYANOCOBALAMIN) 1000 MCG tablet Take 1,000 mcg by mouth daily.       No current facility-administered medications on file prior to visit.    BP 132/74  Pulse 60  Temp(Src) 97.9 F (36.6 C) (Oral)  Ht 5\' 9"  (1.753 m)  Wt 172 lb (78.019 kg)  BMI 25.39 kg/m2       Objective:   Physical Exam  Constitutional: He is oriented to person, place, and time. He appears well-developed and well-nourished.  HENT:  Head: Normocephalic and atraumatic.  Eyes: Conjunctivae are normal. Pupils are equal, round, and reactive to light.  Neck: Normal range of motion. Neck supple.  Cardiovascular: Normal rate and regular rhythm.   Pulmonary/Chest: Effort normal and breath sounds normal.  Abdominal: Soft. Bowel sounds are normal.  Neurological: He is alert and oriented to person, place, and time.           Assessment & Plan:  Had a bad experience with  Nutrition at [facility] Has not fully understood his diagnosis ( denial)  Has been on the invokanna  a1c is improved at 6.5  7.9 was last in system  Started at 9.4   Will increase the invokana to 300.  Refill ED  Has noted  side effects from the urological drugs  New tremor? Unilateral could be parkinsons  BET vs drug effect.

## 2013-06-01 ENCOUNTER — Other Ambulatory Visit: Payer: Self-pay | Admitting: Neurology

## 2013-06-02 ENCOUNTER — Telehealth: Payer: Self-pay

## 2013-06-02 NOTE — Telephone Encounter (Signed)
Relevant patient education assigned to patient using Emmi. ° °

## 2013-06-17 ENCOUNTER — Other Ambulatory Visit (HOSPITAL_COMMUNITY): Payer: Self-pay | Admitting: Neurology

## 2013-06-17 ENCOUNTER — Encounter: Payer: Self-pay | Admitting: Neurology

## 2013-06-17 ENCOUNTER — Ambulatory Visit (INDEPENDENT_AMBULATORY_CARE_PROVIDER_SITE_OTHER): Payer: Medicare Other | Admitting: Neurology

## 2013-06-17 VITALS — BP 118/62 | HR 64 | Resp 18 | Ht 68.0 in | Wt 176.0 lb

## 2013-06-17 DIAGNOSIS — R7401 Elevation of levels of liver transaminase levels: Secondary | ICD-10-CM | POA: Diagnosis not present

## 2013-06-17 DIAGNOSIS — R0989 Other specified symptoms and signs involving the circulatory and respiratory systems: Secondary | ICD-10-CM

## 2013-06-17 DIAGNOSIS — Z5181 Encounter for therapeutic drug level monitoring: Secondary | ICD-10-CM | POA: Diagnosis not present

## 2013-06-17 DIAGNOSIS — E1142 Type 2 diabetes mellitus with diabetic polyneuropathy: Secondary | ICD-10-CM | POA: Diagnosis not present

## 2013-06-17 DIAGNOSIS — R7402 Elevation of levels of lactic acid dehydrogenase (LDH): Secondary | ICD-10-CM | POA: Diagnosis not present

## 2013-06-17 DIAGNOSIS — I6529 Occlusion and stenosis of unspecified carotid artery: Secondary | ICD-10-CM

## 2013-06-17 DIAGNOSIS — R740 Nonspecific elevation of levels of transaminase and lactic acid dehydrogenase [LDH]: Secondary | ICD-10-CM

## 2013-06-17 DIAGNOSIS — G20A1 Parkinson's disease without dyskinesia, without mention of fluctuations: Secondary | ICD-10-CM

## 2013-06-17 DIAGNOSIS — G20 Parkinson's disease: Secondary | ICD-10-CM | POA: Insufficient documentation

## 2013-06-17 DIAGNOSIS — E1149 Type 2 diabetes mellitus with other diabetic neurological complication: Secondary | ICD-10-CM

## 2013-06-17 LAB — COMPREHENSIVE METABOLIC PANEL
ALT: 32 U/L (ref 0–53)
AST: 35 U/L (ref 0–37)
Albumin: 4.6 g/dL (ref 3.5–5.2)
Alkaline Phosphatase: 40 U/L (ref 39–117)
BUN: 12 mg/dL (ref 6–23)
CO2: 28 mEq/L (ref 19–32)
Calcium: 9.9 mg/dL (ref 8.4–10.5)
Chloride: 102 mEq/L (ref 96–112)
Creat: 0.68 mg/dL (ref 0.50–1.35)
Glucose, Bld: 112 mg/dL — ABNORMAL HIGH (ref 70–99)
Potassium: 4.6 mEq/L (ref 3.5–5.3)
Sodium: 138 mEq/L (ref 135–145)
Total Bilirubin: 1 mg/dL (ref 0.2–1.2)
Total Protein: 7 g/dL (ref 6.0–8.3)

## 2013-06-17 MED ORDER — CARBIDOPA-LEVODOPA 25-100 MG PO TABS: 1.0000 | ORAL_TABLET | Freq: Three times a day (TID) | ORAL | Status: DC

## 2013-06-17 NOTE — Progress Notes (Signed)
Stephen Robbins was seen today in the movement disorders clinic for neurologic consultation at the request of Georgetta Haber, MD.  The consultation is for the evaluation of tremor.  The first symptom(s) the patient noticed was unilateral hand tremor of the L hand and this was 2 years ago.  Pt states that he wonders if it is due to anxiety or "old age."   He also wonders if it is related to prior arm/hand surgery.  He is L hand dominant.  Specific Symptoms:  Tremor: yes (notes when relaxed and only in the L hand) Voice: no voice changes  Sleep: sleeps as well as he ever had.    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  yes one fall a year ago, walking at park and couldn't stop himself from going forward; only fall he has ever had Bradykinesia symptoms: slow movements Loss of smell:  yes Loss of taste:  no Urinary Incontinence:  yes, relates to unsuccessful TURP; used depends but combination of flomax and myrbetriq helped.   Difficulty Swallowing:  no Handwriting, micrographia: no Trouble with ADL's:  no  Trouble buttoning clothing: no Depression:  yes but relates to wifes dx of AD; frustrated that can no longer travel because has to be with wife  Memory changes:  no (pt does finances/bills without problem; drives without a trouble; prepares own pill box and his wifes and has no trouble) Hallucinations:  no  visual distortions: no N/V:  no Lightheaded:  yes but very rare and occurs upon quickly standing  Syncope: no Diplopia:  no Dyskinesia:  no  No neuroimaging since CT brain in 2009.  PREVIOUS MEDICATIONS: none to date  ALLERGIES:   Allergies  Allergen Reactions  . Penicillins Other (See Comments)    Unknown childhood reaction    CURRENT MEDICATIONS:    Current Outpatient Prescriptions on File Prior to Visit  Medication Sig Dispense Refill  . ALPRAZolam (XANAX) 0.25 MG tablet       . aspirin 81 MG tablet Take 81 mg by mouth daily.      . B Complex Vitamins (B COMPLEX 50) TABS Take 1 tablet by mouth every morning.       . benzonatate (TESSALON) 100 MG capsule Take 100 mg by mouth 3 (three) times daily as needed for cough.      . bisacodyl (DULCOLAX) 5 MG EC tablet Take 5 mg by mouth daily.       . Canagliflozin (INVOKANA) 100 MG TABS Take 1 tablet (100 mg total) by mouth daily.  30 tablet  11  . fish oil-omega-3 fatty acids 1000 MG capsule Take 2 g by mouth daily.      . folic acid (FOLVITE) 1 MG tablet Take 1 tablet (1 mg total) by mouth daily.  90 tablet  3  . Glucosamine-Chondroit-Vit C-Mn (GLUCOSAMINE CHONDR 500 COMPLEX) CAPS Take 1 capsule by mouth 2 (two) times daily.    0  . hydrocortisone 0.5 % cream Apply 1 application topically 2 (two) times daily as needed. rash      . hydrocortisone-pramoxine (ANALPRAM-HC) 2.5-1 % rectal cream Place rectally as directed.  30 g  6  . ibuprofen (ADVIL,MOTRIN) 200 MG tablet Take 200  mg by mouth every 6 (six) hours as needed for pain.      Marland Kitchen levothyroxine (SYNTHROID, LEVOTHROID) 50 MCG tablet Take 1 tablet (50 mcg total) by mouth daily before breakfast.  90 tablet  3  . mirabegron ER (MYRBETRIQ) 25 MG TB24 tablet Take 25 mg by mouth daily.      Vladimir Faster Glycol-Propyl Glycol (SYSTANE ULTRA) 0.4-0.3 % SOLN Place 1 drop into both eyes daily as needed.       . sertraline (ZOLOFT) 25 MG tablet       . simvastatin (ZOCOR) 20 MG tablet TAKE 1 TABLET EACH EVENING  90 tablet  3  . tadalafil (CIALIS) 5 MG tablet Take 5 mg by mouth daily as needed for erectile dysfunction.      . tamsulosin (FLOMAX) 0.4 MG CAPS capsule take 1 capsule by mouth once daily      . Testosterone (AXIRON) 30 MG/ACT SOLN Place 2 Applicatorfuls onto the skin daily. Dr Risa Grill ordered  90 mL  ml  . vitamin B-12 (CYANOCOBALAMIN) 1000 MCG  tablet Take 1,000 mcg by mouth daily.       No current facility-administered medications on file prior to visit.    PAST MEDICAL HISTORY:   Past Medical History  Diagnosis Date  . Hyperlipidemia   . Hemorrhoids   . Arthritis   . Low back pain   . Hypothyroidism   . Asymptomatic bilateral carotid artery stenosis     per duplex  05-15-2012  left >39%/   right 40-59%  . Bradycardia   . Fatigue   . Dizzy   . BPH (benign prostatic hypertrophy) with urinary obstruction   . NSVT (nonsustained ventricular tachycardia)     cardiologist-  dr croitoru  . Chronotropic incompetence with sinus node dysfunction   . Borderline diabetes   . GERD (gastroesophageal reflux disease)     occasional  . Compression of lumbar vertebra     L4 -- L5  . Compressed cervical disc   . History of basal cell carcinoma excision   . Frequency of urination   . Urgency of urination   . Nocturia   . Wears glasses     PAST SURGICAL HISTORY:   Past Surgical History  Procedure Laterality Date  . Carpal tunnel release Left 02-09-2010    ULNAR NERVE RELEASE  . Closed reduction nasal fx  09-01-2007  . Exercise tolerence test  12-03-2012  DR CROITURO    CHRONOTROPIC INCOMPETENCE/ NORMAL RESTING BP W/ APPROPRIATE RESPONSE/ NO CHEST PAIN/ NO ST CHANGES FROM BASELINE  . Right middle finger surgery  1978  . Tonsillectomy and adenoidectomy  AGE 50  . Cystoscopy N/A 12/28/2012    Procedure: CYSTOSCOPY;  Surgeon: Bernestine Amass, MD;  Location: [facility];  Service: Urology;  Laterality: N/A;  . Transurethral incision of prostate N/A 12/28/2012    Procedure: TRANSURETHRAL INCISION OF THE PROSTATE (TUIP);  Surgeon: Bernestine Amass, MD;  Location: [facility];  Service: Urology;  Laterality: N/A;  . Ulnar nerve transposition      SOCIAL HISTORY:   History   Social History  . Marital Status: Married    Spouse Name: N/A    Number of Children: 0  . Years of Education: N/A   Occupational  History  . retired     Programme researcher, broadcasting/film/video    Social History Main Topics  . Smoking status: Former Smoker    Types: Cigarettes    Quit date: 03/25/1968  . Smokeless  tobacco: Never Used  . Alcohol Use: 4.2 oz/week    7 Glasses of wine per week     Comment: 1 glass wine daily  . Drug Use: No  . Sexual Activity: Not on file   Other Topics Concern  . Not on file   Social History Narrative   Regular exercise          FAMILY HISTORY:   Family Status  Relation Status Death Age  . Father Deceased     stroke  . Mother Deceased     lung cancer  . Brother Alive     healthy    ROS:  Goes back and forth between constipation and diarrhea.  Exercises faithfully.  Volunteers.  A complete 10 system review of systems was obtained and was unremarkable apart from what is mentioned above.  PHYSICAL EXAMINATION:    VITALS:   Filed Vitals:   06/17/13 1032  BP: 118/62  Pulse: 64  Resp: 18  Height: 5\' 8"  (1.727 m)  Weight: 176 lb (79.833 kg)    GEN:  The patient appears stated age and is in NAD. HEENT:  Normocephalic, atraumatic.  The mucous membranes are moist. The superficial temporal arteries are without ropiness or tenderness. CV:  RRR Lungs:  CTAB Neck/HEME:  There is a R carotid bruit.    Neurological examination:  Orientation: The patient is alert and oriented x3. Fund of knowledge is appropriate.  Recent and remote memory are intact.  Attention and concentration are normal.    Able to name objects and repeat phrases. Cranial nerves: There is good facial symmetry. Mild facial hypomimia.  Pupils are equal round and reactive to light bilaterally. Fundoscopic exam reveals clear margins bilaterally. Extraocular muscles are intact. The visual fields are full to confrontational testing. The speech is fluent and clear.  The patient is able to make the gutteral sounds without difficulty. Soft palate rises symmetrically and there is no tongue deviation. Hearing is intact to conversational  tone. Sensation: Sensation is intact to light and pinprick throughout (facial, trunk, extremities). Vibration is decreased at the bilateral big toe, R greater than L. There is no extinction with double simultaneous stimulation. There is no sensory dermatomal level identified. Motor: Strength is 5/5 in the bilateral upper and lower extremities.   Shoulder shrug is equal and symmetric.  There is no pronator drift. Deep tendon reflexes: Deep tendon reflexes are 1/4 at the bilateral biceps, triceps, brachioradialis, patella and trace at the bilateral achilles. Plantar responses are downgoing bilaterally.  Movement examination: Tone: There is increased tone in the bilateral upper extremities, L more than R.  The tone in the lower extremities is increased.  Abnormal movements: There is a very mild rest tremor bilaterally, the L is more evident than the R but the right can be felt as well especially with distraction techniques. Coordination:  There is mild decremation with RAM's, seen most prominently with finger taps on the L and toe taps.  Others were good. Gait and Station: The patient has no difficulty arising out of a deep-seated chair without the use of the hands. The patient's stride length is fairly normal but good arm swing.  The patient has a negative pull test.      LABS:  Lab Results  Component Value Date   TSH 2.16 08/31/2012   No results found for this basename: VITAMINB12     Chemistry      Component Value Date/Time   NA 139 12/28/2012 1026   K  3.9 12/28/2012 1026   CL 102 08/02/2012 1404   CO2 27 05/25/2012 1429   BUN 11 08/02/2012 1404   CREATININE 0.60 08/02/2012 1404      Component Value Date/Time   CALCIUM 9.5 05/25/2012 1429   ALKPHOS 41 02/27/2011 1038   AST 51* 02/27/2011 1038   ALT 66* 02/27/2011 1038   BILITOT 1.1 02/27/2011 1038      Lab Results  Component Value Date   HGBA1C 7.9* 08/31/2012    ASSESSMENT/PLAN:  1.  Parkinsonism.  I suspect that this does represent  idiopathic Parkinson's disease.  I see no atypical features.    -We discussed the diagnosis as well as pathophysiology of the disease.  We discussed treatment options as well as prognostic indicators.  Patient education was provided.  -Greater than 50% of the 60 minute visit was spent in counseling answering questions and talking about what to expect now as well as in the future.  We talked about medication options as well as potential future surgical options.  We talked about safety in the home.  -We decided to add carbidopa/levodopa 25/100.  1/2 tab tid x 1 wk, then 1/2 in am & noon & 1 at night for a week, then 1/2 in am &1 at noon &night for a week, then 1 po tid.  Risks, benefits, side effects and alternative therapies were discussed.  The opportunity to ask questions was given and they were answered to the best of my ability.  The patient expressed understanding and willingness to follow the outlined treatment protocols.  -I will refer the patient [facility] the Parkinson's program [facility], for PT/OT and ST.  We talked about the importance of cardiovascular exercise in Parkinson's disease. 2.  Peripheral neuropathy  -There is evidence of this on examination.  Likely from DM but we should r/o reversible causes.    -Labs ordered:  B12, Folate, RPR, SPEP and UPEP with immunofixation 3.  Hx of transaminasemia  -These were elevated in 2012 slightly and I will recheck them today 4.  R carotid bruit with hx of carotid stenosis  -04/2012 u/s with 40-50% stenosis on the R.  We will recheck this today. 5.  Return in about 9 weeks (around 08/19/2013).

## 2013-06-17 NOTE — Addendum Note (Signed)
Addended byAnnamaria Helling on: 06/17/2013 02:32 PM   Modules accepted: Orders

## 2013-06-17 NOTE — Patient Instructions (Addendum)
1. Start Carbidopa-Levodopa as follows: 1/2 tab three times a day before meals x 1 wk, then 1/2 in am & noon & 1 in evening for a week, then 1/2 in am &1 at noon &one in evening for a week, then 1 tablet three times a day before meals. A prescription has been sent to your pharmacy.  2. We have set you up at [facility] for your Carotid Ultrasound. Please go to the [facility] and register at section A at 1:45pm. You are scheduled for 06/22/13 at 2:00pm. If this is not a good date and time please call [phone] to reschedule.  3. Your provider has requested that you have labwork completed today. Please go to [facility] on the first floor of this building before leaving the office today. 4. Follow up 10 weeks.

## 2013-06-18 ENCOUNTER — Encounter: Payer: Self-pay | Admitting: Neurology

## 2013-06-18 LAB — FOLATE: Folate: >20 ng/mL

## 2013-06-18 LAB — RPR

## 2013-06-18 LAB — VITAMIN B12: Vitamin B-12: 872 pg/mL (ref 211–911)

## 2013-06-21 LAB — SPEP & IFE WITH QIG
Albumin ELP: 62 % (ref 55.8–66.1)
Alpha-1-Globulin: 3 % (ref 2.9–4.9)
Alpha-2-Globulin: 10.2 % (ref 7.1–11.8)
Beta 2: 5.3 % (ref 3.2–6.5)
Beta Globulin: 6.1 % (ref 4.7–7.2)
Gamma Globulin: 13.4 % (ref 11.1–18.8)
IgA: 307 mg/dL (ref 68–379)
IgG (Immunoglobin G), Serum: 1080 mg/dL (ref 650–1600)
IgM, Serum: 33 mg/dL — ABNORMAL LOW (ref 41–251)
Total Protein, Serum Electrophoresis: 7 g/dL (ref 6.0–8.3)

## 2013-06-21 LAB — UIFE/LIGHT CHAINS/TP QN, 24-HR UR
Albumin, U: DETECTED
Free Kappa Lt Chains,Ur: 2.4 mg/dL (ref 0.14–2.42)
Free Kappa/Lambda Ratio: 7.27 ratio (ref 2.04–10.37)
Free Lambda Lt Chains,Ur: 0.33 mg/dL (ref 0.02–0.67)
Total Protein, Urine: 3.4 mg/dL

## 2013-06-22 ENCOUNTER — Ambulatory Visit (HOSPITAL_COMMUNITY): Payer: Medicare Other

## 2013-07-01 ENCOUNTER — Telehealth: Payer: Self-pay | Admitting: Neurology

## 2013-07-01 NOTE — Telephone Encounter (Signed)
Left message for patient to call back. To see if he would like me to r/s carotid ultrasound he cancelled. Awaiting call back.

## 2013-07-01 NOTE — Telephone Encounter (Signed)
Spoke with patient and he states he will r/s ultrasound. Number given.

## 2013-07-06 ENCOUNTER — Encounter: Payer: Self-pay | Admitting: Internal Medicine

## 2013-07-06 DIAGNOSIS — E1165 Type 2 diabetes mellitus with hyperglycemia: Principal | ICD-10-CM

## 2013-07-06 DIAGNOSIS — IMO0001 Reserved for inherently not codable concepts without codable children: Secondary | ICD-10-CM

## 2013-07-06 MED ORDER — CANAGLIFLOZIN 100 MG PO TABS: 1.0000 | ORAL_TABLET | Freq: Every day | ORAL | Status: DC

## 2013-07-06 NOTE — Telephone Encounter (Signed)
rf for invokana 100 mg sent to pt's pharmacy

## 2013-07-14 ENCOUNTER — Other Ambulatory Visit (HOSPITAL_COMMUNITY): Payer: Self-pay | Admitting: Neurology

## 2013-07-14 ENCOUNTER — Other Ambulatory Visit (HOSPITAL_COMMUNITY): Payer: Self-pay | Admitting: Internal Medicine

## 2013-07-14 ENCOUNTER — Ambulatory Visit (HOSPITAL_COMMUNITY)
Admission: RE | Admit: 2013-07-14 | Discharge: 2013-07-14 | Disposition: A | Discharge status: Home / Self Care | Payer: Medicare Other | Source: Ambulatory Visit | Attending: Internal Medicine | Admitting: Internal Medicine

## 2013-07-14 DIAGNOSIS — R0989 Other specified symptoms and signs involving the circulatory and respiratory systems: Secondary | ICD-10-CM | POA: Insufficient documentation

## 2013-07-14 DIAGNOSIS — I6529 Occlusion and stenosis of unspecified carotid artery: Secondary | ICD-10-CM

## 2013-07-14 DIAGNOSIS — E785 Hyperlipidemia, unspecified: Secondary | ICD-10-CM

## 2013-07-14 DIAGNOSIS — R42 Dizziness and giddiness: Secondary | ICD-10-CM | POA: Diagnosis not present

## 2013-07-14 NOTE — Progress Notes (Signed)
Bilateral carotid artery duplex:  1-39% ICA stenosis.  Vertebral artery flow is antegrade.     

## 2013-07-16 ENCOUNTER — Telehealth: Payer: Self-pay | Admitting: Neurology

## 2013-07-16 NOTE — Telephone Encounter (Signed)
Pt returning call to [facility]. Says it is OK to leave results on his VM, it is his home number # [phone] / Hulda Humphrey

## 2013-07-16 NOTE — Telephone Encounter (Signed)
LMOM letting patient know him carotid doppler is okay.

## 2013-07-16 NOTE — Telephone Encounter (Signed)
Message copied by Annamaria Helling on Fri Jul 16, 2013 10:52 AM ------      Message from: [name], [place] S      Created: Fri Jul 16, 2013  9:59 AM       It was normal      ----- Message -----         From: Annamaria Helling, CMA         Sent: 07/16/2013   8:15 AM           To: [name] [name], DO            Does his doppler look okay to call?        ------

## 2013-07-16 NOTE — Telephone Encounter (Signed)
Left message on machine for patient to call back. To give patient results.

## 2013-07-20 ENCOUNTER — Ambulatory Visit: Payer: Medicare Other | Attending: Neurology | Admitting: Physical Therapy

## 2013-07-20 ENCOUNTER — Encounter: Payer: Self-pay | Admitting: Internal Medicine

## 2013-07-20 DIAGNOSIS — G20 Parkinson's disease: Secondary | ICD-10-CM | POA: Diagnosis not present

## 2013-07-20 DIAGNOSIS — R269 Unspecified abnormalities of gait and mobility: Secondary | ICD-10-CM | POA: Diagnosis not present

## 2013-07-20 DIAGNOSIS — IMO0001 Reserved for inherently not codable concepts without codable children: Secondary | ICD-10-CM | POA: Diagnosis not present

## 2013-07-20 DIAGNOSIS — G20A1 Parkinson's disease without dyskinesia, without mention of fluctuations: Secondary | ICD-10-CM | POA: Insufficient documentation

## 2013-07-22 ENCOUNTER — Telehealth: Payer: Self-pay | Admitting: Internal Medicine

## 2013-07-22 NOTE — Telephone Encounter (Signed)
Pt insisted I ask you if you will accept him and his wife as a transfer from Dr Arnoldo Morale.  Pt states they both have difficult issues. (parkinsons and wife has a neurological disorder).  pls advise. Thank you.

## 2013-07-23 NOTE — Telephone Encounter (Signed)
Pt has now asked if you could take on he and his wife. Pt states he has just been dx w/ parkinsons. Wife has heart and neuro disorders.

## 2013-07-23 NOTE — Telephone Encounter (Signed)
I appreciate the kind words, but I am too full to adequately care for the patients I already have

## 2013-07-23 NOTE — Telephone Encounter (Signed)
I will be happy to see them.

## 2013-07-23 NOTE — Telephone Encounter (Signed)
Sorry I am too full  

## 2013-07-27 ENCOUNTER — Ambulatory Visit: Payer: Medicare Other | Attending: Neurology | Admitting: Physical Therapy

## 2013-07-27 DIAGNOSIS — R269 Unspecified abnormalities of gait and mobility: Secondary | ICD-10-CM | POA: Diagnosis not present

## 2013-07-27 DIAGNOSIS — G20 Parkinson's disease: Secondary | ICD-10-CM | POA: Insufficient documentation

## 2013-07-27 DIAGNOSIS — G20A1 Parkinson's disease without dyskinesia, without mention of fluctuations: Secondary | ICD-10-CM | POA: Insufficient documentation

## 2013-07-27 DIAGNOSIS — IMO0001 Reserved for inherently not codable concepts without codable children: Secondary | ICD-10-CM | POA: Insufficient documentation

## 2013-07-27 NOTE — Telephone Encounter (Signed)
Pt is aware.  

## 2013-07-27 NOTE — Telephone Encounter (Signed)
lmom w/ wife to cb

## 2013-07-30 ENCOUNTER — Ambulatory Visit: Payer: Medicare Other | Admitting: Physical Therapy

## 2013-08-02 ENCOUNTER — Other Ambulatory Visit: Payer: Self-pay | Admitting: Internal Medicine

## 2013-08-03 ENCOUNTER — Ambulatory Visit: Payer: Medicare Other | Admitting: Physical Therapy

## 2013-08-03 ENCOUNTER — Encounter: Payer: Self-pay | Admitting: Internal Medicine

## 2013-08-04 ENCOUNTER — Ambulatory Visit: Payer: Medicare Other | Admitting: Physical Therapy

## 2013-08-09 ENCOUNTER — Ambulatory Visit (INDEPENDENT_AMBULATORY_CARE_PROVIDER_SITE_OTHER): Payer: Medicare Other | Admitting: Internal Medicine

## 2013-08-09 ENCOUNTER — Encounter: Payer: Self-pay | Admitting: Internal Medicine

## 2013-08-09 VITALS — BP 110/60 | HR 62 | Temp 98.1°F | Ht 69.0 in | Wt 171.0 lb

## 2013-08-09 DIAGNOSIS — IMO0002 Reserved for concepts with insufficient information to code with codable children: Secondary | ICD-10-CM | POA: Diagnosis not present

## 2013-08-09 DIAGNOSIS — E039 Hypothyroidism, unspecified: Secondary | ICD-10-CM | POA: Diagnosis not present

## 2013-08-09 DIAGNOSIS — I6529 Occlusion and stenosis of unspecified carotid artery: Secondary | ICD-10-CM | POA: Diagnosis not present

## 2013-08-09 DIAGNOSIS — E1142 Type 2 diabetes mellitus with diabetic polyneuropathy: Secondary | ICD-10-CM

## 2013-08-09 DIAGNOSIS — E1165 Type 2 diabetes mellitus with hyperglycemia: Secondary | ICD-10-CM | POA: Diagnosis not present

## 2013-08-09 DIAGNOSIS — Z23 Encounter for immunization: Secondary | ICD-10-CM

## 2013-08-09 DIAGNOSIS — E1149 Type 2 diabetes mellitus with other diabetic neurological complication: Secondary | ICD-10-CM

## 2013-08-09 DIAGNOSIS — E1169 Type 2 diabetes mellitus with other specified complication: Secondary | ICD-10-CM

## 2013-08-09 LAB — HEMOGLOBIN A1C: Hgb A1c MFr Bld: 6.3 % (ref 4.6–6.5)

## 2013-08-09 LAB — COMPREHENSIVE METABOLIC PANEL
ALT: 27 U/L (ref 0–53)
AST: 32 U/L (ref 0–37)
Albumin: 4.4 g/dL (ref 3.5–5.2)
Alkaline Phosphatase: 34 U/L — ABNORMAL LOW (ref 39–117)
BUN: 18 mg/dL (ref 6–23)
CO2: 28 mEq/L (ref 19–32)
Calcium: 10 mg/dL (ref 8.4–10.5)
Chloride: 102 mEq/L (ref 96–112)
Creatinine, Ser: 0.6 mg/dL (ref 0.4–1.5)
GFR: 134.04 mL/min (ref >60.00)
Glucose, Bld: 103 mg/dL — ABNORMAL HIGH (ref 70–99)
Potassium: 4.3 mEq/L (ref 3.5–5.1)
Sodium: 138 mEq/L (ref 135–145)
Total Bilirubin: 1.3 mg/dL — ABNORMAL HIGH (ref 0.2–1.2)
Total Protein: 7.5 g/dL (ref 6.0–8.3)

## 2013-08-09 MED ORDER — TESTOSTERONE 30 MG/ACT TD SOLN: TRANSDERMAL | Status: DC

## 2013-08-09 NOTE — Progress Notes (Signed)
Subjective:    Patient ID: Stephen Robbins, male    DOB: Oct 16, 1935, 78 y.o.   MRN: [id number]  HPI Follow up for HTN, DM Good CBG's  New diagnosis of parkinson Review of Systems  Constitutional: Negative for fever and fatigue.  HENT: Negative for congestion, hearing loss and postnasal drip.   Eyes: Negative for discharge, redness and visual disturbance.  Respiratory: Negative for cough, shortness of breath and wheezing.   Cardiovascular: Negative for leg swelling.  Gastrointestinal: Negative for abdominal pain, constipation and abdominal distention.  Genitourinary: Negative for urgency and frequency.  Musculoskeletal: Negative for arthralgias, gait problem, joint swelling and neck pain.  Skin: Negative for color change and rash.  Neurological: Positive for tremors. Negative for weakness and light-headedness.  Hematological: Negative for adenopathy.  Psychiatric/Behavioral: Negative for behavioral problems.   Past Medical History  Diagnosis Date  . Hyperlipidemia   . Hemorrhoids   . Arthritis   . Low back pain   . Hypothyroidism   . Asymptomatic bilateral carotid artery stenosis     per duplex  05-15-2012  left >39%/   right 40-59%  . Bradycardia   . Fatigue   . Dizzy   . BPH (benign prostatic hypertrophy) with urinary obstruction   . NSVT (nonsustained ventricular tachycardia)     cardiologist-  dr croitoru  . Chronotropic incompetence with sinus node dysfunction   . Borderline diabetes   . GERD (gastroesophageal reflux disease)     occasional  . Compression of lumbar vertebra     L4 -- L5  . Compressed cervical disc   . History of basal cell carcinoma excision   . Frequency of urination   . Urgency of urination   . Nocturia   . Wears glasses     History   Social History  . Marital Status: Married    Spouse Name: N/A    Number of Children: 0  . Years of Education: N/A   Occupational History  . retired     Programme researcher, broadcasting/film/video    Social History Main Topics  .  Smoking status: Former Smoker    Types: Cigarettes    Quit date: 03/25/1968  . Smokeless tobacco: Never Used  . Alcohol Use: 4.2 oz/week    7 Glasses of wine per week     Comment: 1 glass wine daily  . Drug Use: No  . Sexual Activity: Not on file   Other Topics Concern  . Not on file   Social History Narrative   Regular exercise          Past Surgical History  Procedure Laterality Date  . Carpal tunnel release Left 02-09-2010    ULNAR NERVE RELEASE  . Closed reduction nasal fx  09-01-2007  . Exercise tolerence test  12-03-2012  DR CROITURO    CHRONOTROPIC INCOMPETENCE/ NORMAL RESTING BP W/ APPROPRIATE RESPONSE/ NO CHEST PAIN/ NO ST CHANGES FROM BASELINE  . Right middle finger surgery  1978  . Tonsillectomy and adenoidectomy  AGE 70  . Cystoscopy N/A 12/28/2012    Procedure: CYSTOSCOPY;  Surgeon: Bernestine Amass, MD;  Location: [facility];  Service: Urology;  Laterality: N/A;  . Transurethral incision of prostate N/A 12/28/2012    Procedure: TRANSURETHRAL INCISION OF THE PROSTATE (TUIP);  Surgeon: Bernestine Amass, MD;  Location: [facility];  Service: Urology;  Laterality: N/A;  . Ulnar nerve transposition      Family History  Problem Relation Age of Onset  . Stroke  Father   . Lung cancer Mother     Allergies  Allergen Reactions  . Penicillins Other (See Comments)    Unknown childhood reaction    Current Outpatient Prescriptions on File Prior to Visit  Medication Sig Dispense Refill  . ALPRAZolam (XANAX) 0.25 MG tablet       . aspirin 81 MG tablet Take 81 mg by mouth daily.      Hinda Kehr 30 MG/ACT SOLN place 2 applicatorfuls ONTO SKIN once daily as directed  90 mL  0  . B Complex Vitamins (B COMPLEX 50) TABS Take 1 tablet by mouth every morning.       . benzonatate (TESSALON) 100 MG capsule Take 100 mg by mouth 3 (three) times daily as needed for cough.      . bisacodyl (DULCOLAX) 5 MG EC tablet Take 5 mg by mouth daily.       .  Canagliflozin (INVOKANA) 100 MG TABS Take 1 tablet (100 mg total) by mouth daily.  30 tablet  11  . carbidopa-levodopa (SINEMET IR) 25-100 MG per tablet Take 1 tablet by mouth 3 (three) times daily.  90 tablet  5  . fish oil-omega-3 fatty acids 1000 MG capsule Take 2 g by mouth daily.      . folic acid (FOLVITE) 1 MG tablet Take 1 tablet (1 mg total) by mouth daily.  90 tablet  3  . Glucosamine-Chondroit-Vit C-Mn (GLUCOSAMINE CHONDR 500 COMPLEX) CAPS Take 1 capsule by mouth 2 (two) times daily.    0  . hydrocortisone 0.5 % cream Apply 1 application topically 2 (two) times daily as needed. rash      . hydrocortisone-pramoxine (ANALPRAM-HC) 2.5-1 % rectal cream Place rectally as directed.  30 g  6  . ibuprofen (ADVIL,MOTRIN) 200 MG tablet Take 200 mg by mouth every 6 (six) hours as needed for pain.      Marland Kitchen levothyroxine (SYNTHROID, LEVOTHROID) 50 MCG tablet Take 1 tablet (50 mcg total) by mouth daily before breakfast.  90 tablet  3  . mirabegron ER (MYRBETRIQ) 25 MG TB24 tablet Take 25 mg by mouth daily.      Vladimir Faster Glycol-Propyl Glycol (SYSTANE ULTRA) 0.4-0.3 % SOLN Place 1 drop into both eyes daily as needed.       . sertraline (ZOLOFT) 25 MG tablet       . simvastatin (ZOCOR) 20 MG tablet TAKE 1 TABLET EACH EVENING  90 tablet  3  . tadalafil (CIALIS) 5 MG tablet Take 5 mg by mouth daily as needed for erectile dysfunction.      . tamsulosin (FLOMAX) 0.4 MG CAPS capsule take 1 capsule by mouth once daily      . vitamin B-12 (CYANOCOBALAMIN) 1000 MCG tablet Take 1,000 mcg by mouth daily.       No current facility-administered medications on file prior to visit.    BP 110/60  Pulse 62  Temp(Src) 98.1 F (36.7 C) (Oral)  Ht 5\' 9"  (1.753 m)  Wt 171 lb (77.565 kg)  BMI 25.24 kg/m2  SpO2 97%       Objective:   Physical Exam  Nursing note and vitals reviewed. Constitutional: He is oriented to person, place, and time. He appears well-developed and well-nourished.  HENT:  Head:  Normocephalic and atraumatic.  Eyes: Conjunctivae are normal. Pupils are equal, round, and reactive to light.  Cardiovascular: Normal rate and regular rhythm.   Murmur heard. Pulmonary/Chest: Effort normal and breath sounds normal.  Neurological: He is alert  and oriented to person, place, and time.          Assessment & Plan:  New diagnosis of parkinsons Discussion of exercise and role in disease process Discussion of sinemet and role in parkinsons Does not note gate issues as of yet. No falls reported.  Discussion of sustained release medications Should he remain on the Myrbetriq? A1c on invokanna 100 Will discuss increasing the invokanna

## 2013-08-09 NOTE — Progress Notes (Signed)
Pre visit review using our clinic review tool, if applicable. No additional management support is needed unless otherwise documented below in the visit note. 

## 2013-08-09 NOTE — Patient Instructions (Signed)
The patient is instructed to continue all medications as prescribed. Schedule followup with check out clerk upon leaving the clinic  

## 2013-08-09 NOTE — Addendum Note (Signed)
Addended by: Julieta Bellini on: 08/09/2013 01:13 PM   Modules accepted: Orders

## 2013-08-10 ENCOUNTER — Ambulatory Visit: Payer: Medicare Other | Admitting: Physical Therapy

## 2013-08-11 NOTE — Telephone Encounter (Signed)
Shingles vaccine updated.

## 2013-08-12 ENCOUNTER — Ambulatory Visit: Payer: Medicare Other | Admitting: Physical Therapy

## 2013-08-17 ENCOUNTER — Ambulatory Visit: Payer: Medicare Other | Admitting: Physical Therapy

## 2013-08-18 ENCOUNTER — Ambulatory Visit: Payer: Medicare Other | Admitting: Neurology

## 2013-08-19 ENCOUNTER — Ambulatory Visit (INDEPENDENT_AMBULATORY_CARE_PROVIDER_SITE_OTHER): Payer: Medicare Other | Admitting: Neurology

## 2013-08-19 ENCOUNTER — Encounter: Payer: Self-pay | Admitting: Neurology

## 2013-08-19 VITALS — BP 122/62 | HR 62 | Ht 69.0 in | Wt 177.0 lb

## 2013-08-19 DIAGNOSIS — I6529 Occlusion and stenosis of unspecified carotid artery: Secondary | ICD-10-CM | POA: Diagnosis not present

## 2013-08-19 DIAGNOSIS — G20A1 Parkinson's disease without dyskinesia, without mention of fluctuations: Secondary | ICD-10-CM

## 2013-08-19 DIAGNOSIS — E1142 Type 2 diabetes mellitus with diabetic polyneuropathy: Secondary | ICD-10-CM | POA: Diagnosis not present

## 2013-08-19 DIAGNOSIS — E1149 Type 2 diabetes mellitus with other diabetic neurological complication: Secondary | ICD-10-CM

## 2013-08-19 DIAGNOSIS — R0989 Other specified symptoms and signs involving the circulatory and respiratory systems: Secondary | ICD-10-CM | POA: Diagnosis not present

## 2013-08-19 DIAGNOSIS — G20 Parkinson's disease: Secondary | ICD-10-CM

## 2013-08-19 NOTE — Progress Notes (Signed)
Stephen Robbins was seen today in f/u in the movement clinic.  He was dx with PD last visit, on 06/17/13.  He is on carbidopa/levodopa 25/100 tid.  He has no side effects with the carbidopa/levodopa, but states that "I just don't know what to expect."  He continues to have tremor, which can be bothersome for him.  He has had no falls.  No lightheadedness.  No near syncope.  No hallucinations.  He had a carotid ultrasound done since last visit.  There is 1-39% stenosis bilaterally.  He did have some lab work done since last visit.  His hemoglobin A1c has greatly improved.  It is 6.3 (was 7.9 and prior to that was 9.4). He just finished PT and is enrolled in the exercise class now.  He presents today with a long list of questions.  He again tape records our session.    No neuroimaging since CT brain in 2009.  PREVIOUS MEDICATIONS: none to date  ALLERGIES:   Allergies  Allergen Reactions  . Penicillins Other (See Comments)    Unknown childhood reaction    CURRENT MEDICATIONS:  Current Outpatient Prescriptions on File Prior to Visit  Medication Sig Dispense Refill  . ALPRAZolam (XANAX) 0.25 MG tablet       . aspirin 81 MG tablet Take 81 mg by mouth daily.      . B Complex Vitamins (B COMPLEX 50) TABS Take 1 tablet by mouth every morning.       . benzonatate (TESSALON) 100 MG capsule Take 100 mg by mouth 3 (three) times daily as needed for cough.      . bisacodyl (DULCOLAX) 5 MG EC tablet Take 5 mg by mouth daily.       . Canagliflozin (INVOKANA) 100 MG TABS Take 1 tablet (100 mg total) by mouth daily.  30 tablet  11  . carbidopa-levodopa (SINEMET IR) 25-100 MG per tablet Take 1 tablet by mouth 3 (three) times daily.  90 tablet  5  . fish oil-omega-3 fatty acids 1000 MG capsule Take 2 g by mouth daily.      . folic acid (FOLVITE) 1 MG tablet Take 1 tablet (1 mg total) by mouth daily.  90 tablet  3  . Glucosamine-Chondroit-Vit C-Mn (GLUCOSAMINE CHONDR 500 COMPLEX) CAPS Take 1 capsule by mouth 2  (two) times daily.    0  . hydrocortisone 0.5 % cream Apply 1 application topically 2 (two) times daily as needed. rash      . hydrocortisone-pramoxine (ANALPRAM-HC) 2.5-1 % rectal cream Place rectally as directed.  30 g  6  . ibuprofen (ADVIL,MOTRIN) 200 MG tablet Take 200 mg by mouth every 6 (six) hours as needed for pain.      Marland Kitchen levothyroxine (SYNTHROID, LEVOTHROID) 50 MCG tablet Take 1 tablet (50 mcg total) by mouth daily before breakfast.  90 tablet  3  . mirabegron ER (MYRBETRIQ) 25 MG TB24 tablet Take 25 mg by mouth daily.      Bertram Gala Glycol-Propyl Glycol (SYSTANE ULTRA) 0.4-0.3 % SOLN Place 1 drop into both eyes daily as needed.       . sertraline (ZOLOFT) 25 MG tablet       . simvastatin (ZOCOR) 20 MG tablet TAKE 1 TABLET EACH EVENING  90 tablet  3  . tadalafil (CIALIS) 5 MG tablet Take 5 mg by mouth daily as needed for erectile dysfunction.      . tamsulosin (FLOMAX) 0.4 MG CAPS capsule take 1 capsule by  mouth once daily      . Testosterone (AXIRON) 30 MG/ACT SOLN place 2 applicatorfuls ONTO SKIN once daily as directed  90 mL  3  . vitamin B-12 (CYANOCOBALAMIN) 1000 MCG tablet Take 1,000 mcg by mouth daily.       No current facility-administered medications on file prior to visit.    PAST MEDICAL HISTORY:   Past Medical History  Diagnosis Date  . Hyperlipidemia   . Hemorrhoids   . Arthritis   . Low back pain   . Hypothyroidism   . Asymptomatic bilateral carotid artery stenosis     per duplex  05-15-2012  left >39%/   right 40-59%  . Bradycardia   . Fatigue   . Dizzy   . BPH (benign prostatic hypertrophy) with urinary obstruction   . NSVT (nonsustained ventricular tachycardia)     cardiologist-  dr croitoru  . Chronotropic incompetence with sinus node dysfunction   . Borderline diabetes   . GERD (gastroesophageal reflux disease)     occasional  . Compression of lumbar vertebra     L4 -- L5  . Compressed cervical disc   . History of basal cell carcinoma excision     . Frequency of urination   . Urgency of urination   . Nocturia   . Wears glasses     PAST SURGICAL HISTORY:   Past Surgical History  Procedure Laterality Date  . Carpal tunnel release Left 02-09-2010    ULNAR NERVE RELEASE  . Closed reduction nasal fx  09-01-2007  . Exercise tolerence test  12-03-2012  DR CROITURO    CHRONOTROPIC INCOMPETENCE/ NORMAL RESTING BP W/ APPROPRIATE RESPONSE/ NO CHEST PAIN/ NO ST CHANGES FROM BASELINE  . Right middle finger surgery  1978  . Tonsillectomy and adenoidectomy  AGE 62  . Cystoscopy N/A 12/28/2012    Procedure: CYSTOSCOPY;  Surgeon: Bernestine Amass, MD;  Location: [facility];  Service: Urology;  Laterality: N/A;  . Transurethral incision of prostate N/A 12/28/2012    Procedure: TRANSURETHRAL INCISION OF THE PROSTATE (TUIP);  Surgeon: Bernestine Amass, MD;  Location: [facility];  Service: Urology;  Laterality: N/A;  . Ulnar nerve transposition      SOCIAL HISTORY:   History   Social History  . Marital Status: Married    Spouse Name: N/A    Number of Children: 0  . Years of Education: N/A   Occupational History  . retired     Programme researcher, broadcasting/film/video    Social History Main Topics  . Smoking status: Former Smoker    Types: Cigarettes    Quit date: 03/25/1968  . Smokeless tobacco: Never Used  . Alcohol Use: 4.2 oz/week    7 Glasses of wine per week     Comment: 1 glass wine daily  . Drug Use: No  . Sexual Activity: Not on file   Other Topics Concern  . Not on file   Social History Narrative   Regular exercise          FAMILY HISTORY:   Family Status  Relation Status Death Age  . Father Deceased     stroke  . Mother Deceased     lung cancer  . Brother Alive     healthy    ROS:  Goes back and forth between constipation and diarrhea.  Exercises faithfully.  Volunteers.  A complete 10 system review of systems was obtained and was unremarkable apart from what is mentioned above.  PHYSICAL EXAMINATION:  VITALS:   Filed Vitals:   08/19/13 1353  BP: 122/62  Pulse: 62  Height: 5\' 9"  (1.753 m)  Weight: 177 lb (80.287 kg)    GEN:  The patient appears stated age and is in NAD. HEENT:  Normocephalic, atraumatic.  The mucous membranes are moist. The superficial temporal arteries are without ropiness or tenderness. CV:  RRR Lungs:  CTAB Neck/HEME:  There is a R carotid bruit.    Neurological examination:  Orientation: The patient is alert and oriented x3. Fund of knowledge is appropriate.  Recent and remote memory are intact.  Attention and concentration are normal.    Able to name objects and repeat phrases. Cranial nerves: There is good facial symmetry. Mild facial hypomimia.  Pupils are equal round and reactive to light bilaterally. Fundoscopic exam reveals clear margins bilaterally. Extraocular muscles are intact. The visual fields are full to confrontational testing. The speech is fluent and clear.  The patient is able to make the gutteral sounds without difficulty. Soft palate rises symmetrically and there is no tongue deviation. Hearing is intact to conversational tone. Motor: Strength is 5/5 in the bilateral upper and lower extremities.   Shoulder shrug is equal and symmetric.  There is no pronator drift.  Movement examination: Tone: There is normal tone bilaterally today Abnormal movements: There is an intermittent rest tremor on the left.  I saw no rest tremor on the right today, which is improved. Coordination:  There is good rapid alternating movements today, which is an improvement. Gait and Station: The patient has no difficulty arising out of a deep-seated chair without the use of the hands. The patient's stride length is fairly normal with good arm swing.  The patient has a negative pull test.      LABS:  Lab Results  Component Value Date   TSH 2.16 08/31/2012   Lab Results  Component Value Date   VITAMINB12 872 06/17/2013     Chemistry      Component Value Date/Time    NA 138 08/09/2013 1313   K 4.3 08/09/2013 1313   CL 102 08/09/2013 1313   CO2 28 08/09/2013 1313   BUN 18 08/09/2013 1313   CREATININE 0.6 08/09/2013 1313   CREATININE 0.68 06/17/2013 1215      Component Value Date/Time   CALCIUM 10.0 08/09/2013 1313   ALKPHOS 34* 08/09/2013 1313   AST 32 08/09/2013 1313   ALT 27 08/09/2013 1313   BILITOT 1.3* 08/09/2013 1313     RPR negative Lab Results  Component Value Date   FOLATE >20.0 06/17/2013     Lab Results  Component Value Date   HGBA1C 6.3 08/09/2013    ASSESSMENT/PLAN:  1. idiopathic Parkinson's disease.  I see no atypical features.    -We discussed the diagnosis as well as pathophysiology of the disease.  We discussed treatment options as well as prognostic indicators.  Patient education was provided.  -Greater than 50% of the 40 minute visit was again spent in counseling answering questions and talking about what to expect now as well as in the future.  We talked about medication options as well as potential future surgical options.  We talked about safety in the home.  We talked extensively about exercise and its benefits.  We talked about various types of exercise.  We talked about programs in the community.  He is already enrolled in the Parkinson's exercise program here.  -He will continue the carbidopa/levodopa 25/100, one tablet 3 times daily.  -We  talked extensively about tremor.  We talked about the fact that carbidopa/levodopa may not work as well for tremor as other medications, but I would be leery about adding other medications in his age group that target tremor as this can have significant side effects.  He was agreeable to holding on any further medication for now.  -He asked me to look at his list of medications to see if any of those would be detrimental.  I told him right now I would not change his other medications.  Some of his bladder medications in the future may be a problem, but I see no problems currently. 2.   Peripheral neuropathy  -There is evidence of this on examination.  Likely from DM.  His workup for reversible causes was negative.  His diabetes is under much better control than in the past. 3.  Hx of transaminasemia  -These were elevated in 2012 but have improved 4.  R carotid bruit with hx of carotid stenosis  -04/2012 u/s with 40-50% stenosis on the R.  this was rechecked in 2015 and was 1-39% bilaterally. 5.  Return in about 3 months (around 11/19/2013).

## 2013-08-24 DIAGNOSIS — N138 Other obstructive and reflux uropathy: Secondary | ICD-10-CM | POA: Diagnosis not present

## 2013-08-24 DIAGNOSIS — N139 Obstructive and reflux uropathy, unspecified: Secondary | ICD-10-CM | POA: Diagnosis not present

## 2013-08-24 DIAGNOSIS — N401 Enlarged prostate with lower urinary tract symptoms: Secondary | ICD-10-CM | POA: Diagnosis not present

## 2013-08-24 DIAGNOSIS — R3915 Urgency of urination: Secondary | ICD-10-CM | POA: Diagnosis not present

## 2013-09-23 ENCOUNTER — Encounter: Payer: Self-pay | Admitting: Neurology

## 2013-10-06 ENCOUNTER — Encounter: Payer: Self-pay | Admitting: Neurology

## 2013-11-04 ENCOUNTER — Encounter: Payer: Self-pay | Admitting: Internal Medicine

## 2013-11-06 ENCOUNTER — Encounter: Payer: Self-pay | Admitting: Internal Medicine

## 2013-11-17 ENCOUNTER — Ambulatory Visit: Payer: Medicare Other | Admitting: Neurology

## 2013-11-18 ENCOUNTER — Ambulatory Visit: Payer: Medicare Other | Admitting: Neurology

## 2013-11-19 ENCOUNTER — Encounter: Payer: Self-pay | Admitting: Neurology

## 2013-11-19 ENCOUNTER — Ambulatory Visit (INDEPENDENT_AMBULATORY_CARE_PROVIDER_SITE_OTHER): Payer: Medicare Other | Admitting: Neurology

## 2013-11-19 VITALS — BP 122/60 | HR 74 | Temp 98.0°F | Resp 18 | Ht 69.0 in | Wt 176.0 lb

## 2013-11-19 DIAGNOSIS — I6529 Occlusion and stenosis of unspecified carotid artery: Secondary | ICD-10-CM | POA: Diagnosis not present

## 2013-11-19 DIAGNOSIS — G20 Parkinson's disease: Secondary | ICD-10-CM | POA: Diagnosis not present

## 2013-11-19 DIAGNOSIS — E1149 Type 2 diabetes mellitus with other diabetic neurological complication: Secondary | ICD-10-CM | POA: Diagnosis not present

## 2013-11-19 DIAGNOSIS — E1142 Type 2 diabetes mellitus with diabetic polyneuropathy: Secondary | ICD-10-CM

## 2013-11-19 MED ORDER — CARBIDOPA-LEVODOPA 25-100 MG PO TABS: 1.0000 | ORAL_TABLET | Freq: Three times a day (TID) | ORAL | Status: DC

## 2013-11-19 NOTE — Progress Notes (Addendum)
Stephen Robbins was seen today in f/u in the movement clinic.  He was dx with PD last visit, on 06/17/13.  He is on carbidopa/levodopa 25/100 tid.  He has no side effects with the carbidopa/levodopa, but states that "I just don't know what to expect."  He continues to have tremor, which can be bothersome for him.  He has had no falls.  No lightheadedness.  No near syncope.  No hallucinations.  He had a carotid ultrasound done since last visit.  There is 1-39% stenosis bilaterally.  He did have some lab work done since last visit.  His hemoglobin A1c has greatly improved.  It is 6.3 (was 7.9 and prior to that was 9.4). He just finished Stephen Robbins and is enrolled in the exercise class now.  He presents today with a long list of questions.  He again tape records our session.    11/19/13 update:  Stephen Robbins is f/u regarding Parkinson's disease.  He is currently on carbidopa/levodopa 25/100, one tablet 3 times per day. He is accompanied by his wife, who supplements the history.   Patient denies any falls.  He denies hallucinations.  Denies nausea or vomiting.  No lightheadedness or near syncope.  He is exercising.  He still has tremor and isn't sure that the levodopa helps tremor.  He sent me several e-mails since last visit.  One of these was just a TV news link regarding [facility] high-resolution imaging and deep brain stimulation.  He subsequently sent another e-mail regarding focused ultrasound.  Asks about updates regarding these and has 2 pages of questions.  Asks about why we can't give him human growth hormone or steroids for PD.  Overall, patient is clinically doing well.   No neuroimaging since CT brain in 2009.  PREVIOUS MEDICATIONS: none to date  ALLERGIES:   Allergies  Allergen Reactions  . Penicillins Other (See Comments)    Unknown childhood reaction    CURRENT MEDICATIONS:  Current Outpatient Prescriptions on File Prior to Visit  Medication Sig Dispense Refill  . ALPRAZolam (XANAX) 0.25 MG tablet       .  aspirin 81 MG tablet Take 81 mg by mouth daily.      . B Complex Vitamins (B COMPLEX 50) TABS Take 1 tablet by mouth every morning.       . benzonatate (TESSALON) 100 MG capsule Take 100 mg by mouth 3 (three) times daily as needed for cough.      . bisacodyl (DULCOLAX) 5 MG EC tablet Take 5 mg by mouth daily.       . Canagliflozin (INVOKANA) 100 MG TABS Take 1 tablet (100 mg total) by mouth daily.  30 tablet  11  . fish oil-omega-3 fatty acids 1000 MG capsule Take 2 g by mouth daily.      . folic acid (FOLVITE) 1 MG tablet Take 1 tablet (1 mg total) by mouth daily.  90 tablet  3  . Glucosamine-Chondroit-Vit C-Mn (GLUCOSAMINE CHONDR 500 COMPLEX) CAPS Take 1 capsule by mouth 2 (two) times daily.    0  . hydrocortisone 0.5 % cream Apply 1 application topically 2 (two) times daily as needed. rash      . hydrocortisone-pramoxine (ANALPRAM-HC) 2.5-1 % rectal cream Place rectally as directed.  30 g  6  . ibuprofen (ADVIL,MOTRIN) 200 MG tablet Take 200 mg by mouth every 6 (six) hours as needed for pain.      Marland Kitchen levothyroxine (SYNTHROID, LEVOTHROID) 50 MCG tablet Take 1 tablet (  50 mcg total) by mouth daily before breakfast.  90 tablet  3  . mirabegron ER (MYRBETRIQ) 25 MG TB24 tablet Take 25 mg by mouth daily.      Vladimir Faster Glycol-Propyl Glycol (SYSTANE ULTRA) 0.4-0.3 % SOLN Place 1 drop into both eyes daily as needed.       . sertraline (ZOLOFT) 25 MG tablet       . simvastatin (ZOCOR) 20 MG tablet TAKE 1 TABLET EACH EVENING  90 tablet  3  . tadalafil (CIALIS) 5 MG tablet Take 5 mg by mouth daily as needed for erectile dysfunction.      . tamsulosin (FLOMAX) 0.4 MG CAPS capsule take 1 capsule by mouth once daily      . Testosterone (AXIRON) 30 MG/ACT SOLN place 2 applicatorfuls ONTO SKIN once daily as directed  90 mL  3  . vitamin B-12 (CYANOCOBALAMIN) 1000 MCG tablet Take 1,000 mcg by mouth daily.       No current facility-administered medications on file prior to visit.    PAST MEDICAL HISTORY:     Past Medical History  Diagnosis Date  . Hyperlipidemia   . Hemorrhoids   . Arthritis   . Low back pain   . Hypothyroidism   . Asymptomatic bilateral carotid artery stenosis     per duplex  05-15-2012  left >39%/   right 40-59%  . Bradycardia   . Fatigue   . Dizzy   . BPH (benign prostatic hypertrophy) with urinary obstruction   . NSVT (nonsustained ventricular tachycardia)     cardiologist-  dr croitoru  . Chronotropic incompetence with sinus node dysfunction   . Borderline diabetes   . GERD (gastroesophageal reflux disease)     occasional  . Compression of lumbar vertebra     L4 -- L5  . Compressed cervical disc   . History of basal cell carcinoma excision   . Frequency of urination   . Urgency of urination   . Nocturia   . Wears glasses     PAST SURGICAL HISTORY:   Past Surgical History  Procedure Laterality Date  . Carpal tunnel release Left 02-09-2010    ULNAR NERVE RELEASE  . Closed reduction nasal fx  09-01-2007  . Exercise tolerence test  12-03-2012  DR CROITURO    CHRONOTROPIC INCOMPETENCE/ NORMAL RESTING BP W/ APPROPRIATE RESPONSE/ NO CHEST PAIN/ NO ST CHANGES FROM BASELINE  . Right middle finger surgery  1978  . Tonsillectomy and adenoidectomy  AGE 8  . Cystoscopy N/A 12/28/2012    Procedure: CYSTOSCOPY;  Surgeon: Bernestine Amass, MD;  Location: [facility];  Service: Urology;  Laterality: N/A;  . Transurethral incision of prostate N/A 12/28/2012    Procedure: TRANSURETHRAL INCISION OF THE PROSTATE (TUIP);  Surgeon: Bernestine Amass, MD;  Location: [facility];  Service: Urology;  Laterality: N/A;  . Ulnar nerve transposition      SOCIAL HISTORY:   History   Social History  . Marital Status: Married    Spouse Name: N/A    Number of Children: 0  . Years of Education: N/A   Occupational History  . retired     Programme researcher, broadcasting/film/video    Social History Main Topics  . Smoking status: Former Smoker    Types: Cigarettes    Quit date:  03/25/1968  . Smokeless tobacco: Never Used  . Alcohol Use: 4.2 oz/week    7 Glasses of wine per week     Comment: 1 glass wine  daily  . Drug Use: No  . Sexual Activity: Not on file   Other Topics Concern  . Not on file   Social History Narrative   Regular exercise          FAMILY HISTORY:   Family Status  Relation Status Death Age  . Father Deceased     stroke  . Mother Deceased     lung cancer  . Brother Alive     healthy    ROS:    A complete 10 system review of systems was obtained and was unremarkable apart from what is mentioned above.  PHYSICAL EXAMINATION:    VITALS:   Filed Vitals:   11/19/13 1433  BP: 122/60  Pulse: 74  Temp: 98 F (36.7 C)  TempSrc: Oral  Resp: 18  Height: 5\' 9"  (1.753 m)  Weight: 176 lb (79.833 kg)    GEN:  The patient appears stated age and is in NAD.   Neurological examination:  Orientation: The patient is alert and oriented x3. Fund of knowledge is appropriate.  Recent and remote memory are intact.  Attention and concentration are normal.    Able to name objects and repeat phrases. Cranial nerves: There is good facial symmetry. Mild facial hypomimia.  Pupils are equal round and reactive to light bilaterally. Fundoscopic exam reveals clear margins bilaterally. Extraocular muscles are intact. The visual fields are full to confrontational testing. The speech is fluent and clear.  The patient is able to make the gutteral sounds without difficulty. Soft palate rises symmetrically and there is no tongue deviation. Hearing is intact to conversational tone. Motor: Strength is 5/5 in the bilateral upper and lower extremities.   Shoulder shrug is equal and symmetric.  There is no pronator drift.  Movement examination: Tone: There is normal tone bilaterally today Abnormal movements: There is a very rare intermittent rest tremor on the left.  No R sided tremor Coordination:  There is very minimally decreased RAM's in the LUE Gait and  Station: The patient has no difficulty arising out of a deep-seated chair without the use of the hands. The patient's stride length is fairly normal with good arm swing.  The patient has a negative pull test.      LABS:  Lab Results  Component Value Date   TSH 2.16 08/31/2012   Lab Results  Component Value Date   VITAMINB12 872 06/17/2013     Chemistry      Component Value Date/Time   NA 138 08/09/2013 1313   K 4.3 08/09/2013 1313   CL 102 08/09/2013 1313   CO2 28 08/09/2013 1313   BUN 18 08/09/2013 1313   CREATININE 0.6 08/09/2013 1313   CREATININE 0.68 06/17/2013 1215      Component Value Date/Time   CALCIUM 10.0 08/09/2013 1313   ALKPHOS 34* 08/09/2013 1313   AST 32 08/09/2013 1313   ALT 27 08/09/2013 1313   BILITOT 1.3* 08/09/2013 1313     RPR negative Lab Results  Component Value Date   FOLATE >20.0 06/17/2013     Lab Results  Component Value Date   HGBA1C 6.3 08/09/2013    ASSESSMENT/PLAN:  1. idiopathic Parkinson's disease.  I see no atypical features.    -We discussed the diagnosis as well as pathophysiology of the disease.  We discussed treatment options as well as prognostic indicators.  Patient education was provided.  -Greater than 50% of the 40 minute visit was again spent in counseling answering questions and talking about  what to expect now as well as in the future.  We talked about medication options as well as potential future surgical options.  Talked about focused u/s but not approved for PD at this point.  Wife has not been present before so reviewed prior conversations.  We talked about safety in the home.  We talked extensively about exercise and its benefits.  We talked about various types of exercise.  We talked about programs in the community.  He is already enrolled in the Parkinson's exercise program here.  -He will continue the carbidopa/levodopa 25/100, one tablet 3 times daily.  -We talked extensively about tremor.  We talked about the fact that  carbidopa/levodopa may not work as well for tremor as other medications, but I would be leery about adding other medications in his age group that target tremor as this can have significant side effects.  He was agreeable to holding on any further medication for now.  -Explained to patient that there is no science behind human growth hormone or steroids for PD.  Stephen Robbins was frustrated by this.  Explained the long term SE of steroids and I could think of no beneficial reason for a PD patient to be on these. 2.  Peripheral neuropathy  -There is evidence of this on examination.  Likely from DM.  His workup for reversible causes was negative.  His diabetes is under much better control than in the past. 3.  Hx of transaminasemia  -These were elevated in 2012 but have improved 4.  R carotid bruit with hx of carotid stenosis  -04/2012 u/s with 40-50% stenosis on the R.  this was rechecked in 2015 and was 1-39% bilaterally. 5.  Return in about 3 months (around 02/19/2014).

## 2013-11-26 ENCOUNTER — Encounter: Payer: Self-pay | Admitting: Neurology

## 2013-11-27 ENCOUNTER — Other Ambulatory Visit: Payer: Self-pay | Admitting: Internal Medicine

## 2013-12-14 ENCOUNTER — Ambulatory Visit: Payer: Medicare Other | Admitting: Internal Medicine

## 2013-12-14 ENCOUNTER — Ambulatory Visit (INDEPENDENT_AMBULATORY_CARE_PROVIDER_SITE_OTHER): Payer: Medicare Other | Admitting: Internal Medicine

## 2013-12-14 ENCOUNTER — Encounter: Payer: Self-pay | Admitting: Internal Medicine

## 2013-12-14 VITALS — BP 106/56 | HR 73 | Temp 98.2°F | Resp 20 | Ht 69.0 in | Wt 178.0 lb

## 2013-12-14 DIAGNOSIS — E1142 Type 2 diabetes mellitus with diabetic polyneuropathy: Secondary | ICD-10-CM | POA: Diagnosis not present

## 2013-12-14 DIAGNOSIS — E039 Hypothyroidism, unspecified: Secondary | ICD-10-CM

## 2013-12-14 DIAGNOSIS — E1149 Type 2 diabetes mellitus with other diabetic neurological complication: Secondary | ICD-10-CM | POA: Diagnosis not present

## 2013-12-14 DIAGNOSIS — E785 Hyperlipidemia, unspecified: Secondary | ICD-10-CM | POA: Diagnosis not present

## 2013-12-14 DIAGNOSIS — I6529 Occlusion and stenosis of unspecified carotid artery: Secondary | ICD-10-CM | POA: Diagnosis not present

## 2013-12-14 DIAGNOSIS — M1990 Unspecified osteoarthritis, unspecified site: Secondary | ICD-10-CM

## 2013-12-14 DIAGNOSIS — G20 Parkinson's disease: Secondary | ICD-10-CM

## 2013-12-14 NOTE — Patient Instructions (Signed)
Limit your sodium (Salt) intake    It is important that you exercise regularly, at least 20 minutes 3 to 4 times per week.  If you develop chest pain or shortness of breath seek  medical attention.  Return in 6 months for follow-up  

## 2013-12-14 NOTE — Progress Notes (Signed)
Pre visit review using our clinic review tool, if applicable. No additional management support is needed unless otherwise documented below in the visit note. 

## 2013-12-14 NOTE — Progress Notes (Signed)
Subjective:    Patient ID: Stephen Robbins, male    DOB: [birth year], 78 y.o.   MRN: [id number]  HPI 78 year old patient who is in today for followup and to establish with my practice.  He has a history of diabetes, which has been constant about peripheral neuropathy.  His father closely by neurology for PD.  It recently well.  He has osteoarthritis and low back pain.  He is high-powered as him and dyslipidemia.  He has also been seen by cardiology. Medical records reviewed Hemoglobin A1c s since starting Invokana have  been very well controlled  Past Medical History  Diagnosis Date  . Hyperlipidemia   . Hemorrhoids   . Arthritis   . Low back pain   . Hypothyroidism   . Asymptomatic bilateral carotid artery stenosis     per duplex  05-15-2012  left >39%/   right 40-59%  . Bradycardia   . Fatigue   . Dizzy   . BPH (benign prostatic hypertrophy) with urinary obstruction   . NSVT (nonsustained ventricular tachycardia)     cardiologist-  dr croitoru  . Chronotropic incompetence with sinus node dysfunction   . Borderline diabetes   . GERD (gastroesophageal reflux disease)     occasional  . Compression of lumbar vertebra     L4 -- L5  . Compressed cervical disc   . History of basal cell carcinoma excision   . Frequency of urination   . Urgency of urination   . Nocturia   . Wears glasses     History   Social History  . Marital Status: Married    Spouse Name: N/A    Number of Children: 0  . Years of Education: N/A   Occupational History  . retired     Programme researcher, broadcasting/film/video    Social History Main Topics  . Smoking status: Former Smoker    Types: Cigarettes    Quit date: 03/25/1968  . Smokeless tobacco: Never Used  . Alcohol Use: 4.2 oz/week    7 Glasses of wine per week     Comment: 1 glass wine daily  . Drug Use: No  . Sexual Activity: Not on file   Other Topics Concern  . Not on file   Social History Narrative   Regular exercise          Past Surgical History    Procedure Laterality Date  . Carpal tunnel release Left 02-09-2010    ULNAR NERVE RELEASE  . Closed reduction nasal fx  09-01-2007  . Exercise tolerence test  12-03-2012  DR CROITURO    CHRONOTROPIC INCOMPETENCE/ NORMAL RESTING BP W/ APPROPRIATE RESPONSE/ NO CHEST PAIN/ NO ST CHANGES FROM BASELINE  . Right middle finger surgery  1978  . Tonsillectomy and adenoidectomy  AGE 34  . Cystoscopy N/A 12/28/2012    Procedure: CYSTOSCOPY;  Surgeon: Bernestine Amass, MD;  Location: [facility];  Service: Urology;  Laterality: N/A;  . Transurethral incision of prostate N/A 12/28/2012    Procedure: TRANSURETHRAL INCISION OF THE PROSTATE (TUIP);  Surgeon: Bernestine Amass, MD;  Location: [facility];  Service: Urology;  Laterality: N/A;  . Ulnar nerve transposition      Family History  Problem Relation Age of Onset  . Stroke Father   . Lung cancer Mother     Allergies  Allergen Reactions  . Penicillins Other (See Comments)    Unknown childhood reaction    Current Outpatient Prescriptions on File Prior to Visit  Medication Sig Dispense Refill  . ALPRAZolam (XANAX) 0.25 MG tablet       . aspirin 81 MG tablet Take 81 mg by mouth daily.      . B Complex Vitamins (B COMPLEX 50) TABS Take 1 tablet by mouth every morning.       . benzonatate (TESSALON) 100 MG capsule Take 100 mg by mouth 3 (three) times daily as needed for cough.      . bisacodyl (DULCOLAX) 5 MG EC tablet Take 5 mg by mouth daily.       . Canagliflozin (INVOKANA) 100 MG TABS Take 1 tablet (100 mg total) by mouth daily.  30 tablet  11  . carbidopa-levodopa (SINEMET IR) 25-100 MG per tablet Take 1 tablet by mouth 3 (three) times daily.  270 tablet  3  . fish oil-omega-3 fatty acids 1000 MG capsule Take 2 g by mouth daily.      . folic acid (FOLVITE) 1 MG tablet Take 1 tablet (1 mg total) by mouth daily.  90 tablet  3  . hydrocortisone 0.5 % cream Apply 1 application topically 2 (two) times daily as needed.  rash      . hydrocortisone-pramoxine (ANALPRAM-HC) 2.5-1 % rectal cream Place rectally as directed.  30 g  6  . ibuprofen (ADVIL,MOTRIN) 200 MG tablet Take 200 mg by mouth every 6 (six) hours as needed for pain.      Marland Kitchen levothyroxine (SYNTHROID, LEVOTHROID) 50 MCG tablet Take 1 tablet (50 mcg total) by mouth daily before breakfast.  90 tablet  3  . mirabegron ER (MYRBETRIQ) 25 MG TB24 tablet Take 25 mg by mouth daily.      Vladimir Faster Glycol-Propyl Glycol (SYSTANE ULTRA) 0.4-0.3 % SOLN Place 1 drop into both eyes daily as needed.       . sertraline (ZOLOFT) 25 MG tablet take 1 tablet by mouth once daily  90 tablet  1  . simvastatin (ZOCOR) 20 MG tablet TAKE 1 TABLET EACH EVENING  90 tablet  3  . tadalafil (CIALIS) 5 MG tablet Take 5 mg by mouth daily as needed for erectile dysfunction.      . tamsulosin (FLOMAX) 0.4 MG CAPS capsule take 1 capsule by mouth once daily      . Testosterone (AXIRON) 30 MG/ACT SOLN place 2 applicatorfuls ONTO SKIN once daily as directed  90 mL  3  . vitamin B-12 (CYANOCOBALAMIN) 1000 MCG tablet Take 1,000 mcg by mouth daily.       No current facility-administered medications on file prior to visit.    BP 106/56  Pulse 73  Temp(Src) 98.2 F (36.8 C) (Oral)  Resp 20  Ht 5\' 9"  (1.753 m)  Wt 178 lb (80.74 kg)  BMI 26.27 kg/m2  SpO2 97%      Review of Systems  Constitutional: Positive for fatigue. Negative for fever, chills and appetite change.  HENT: Negative for congestion, dental problem, ear pain, hearing loss, sore throat, tinnitus, trouble swallowing and voice change.   Eyes: Negative for pain, discharge and visual disturbance.  Respiratory: Positive for shortness of breath. Negative for cough, chest tightness, wheezing and stridor.   Cardiovascular: Negative for chest pain, palpitations and leg swelling.  Gastrointestinal: Negative for nausea, vomiting, abdominal pain, diarrhea, constipation, blood in stool and abdominal distention.  Genitourinary:  Negative for urgency, hematuria, flank pain, discharge, difficulty urinating and genital sores.  Musculoskeletal: Negative for arthralgias, back pain, gait problem, joint swelling, myalgias and neck stiffness.  Skin: Negative for  rash.  Neurological: Negative for dizziness, syncope, speech difficulty, weakness, numbness and headaches.  Hematological: Negative for adenopathy. Does not bruise/bleed easily.  Psychiatric/Behavioral: Negative for behavioral problems and dysphoric mood. The patient is not nervous/anxious.        Objective:   Physical Exam  Constitutional: He is oriented to person, place, and time. He appears well-developed.  HENT:  Head: Normocephalic.  Right Ear: External ear normal.  Left Ear: External ear normal.  Eyes: Conjunctivae and EOM are normal.  Neck: Normal range of motion.  Cardiovascular: Normal rate and normal heart sounds.   Decreased left dorsalis pedis pulse  Pulmonary/Chest: Breath sounds normal.  Abdominal: Bowel sounds are normal.  Musculoskeletal: Normal range of motion. He exhibits no edema and no tenderness.  Neurological: He is alert and oriented to person, place, and time.  Psychiatric: He has a normal mood and affect. His behavior is normal.          Assessment & Plan:   Diabetes mellitus.  Will check a hemoglobin A1c.  If this remains in excellent control.  Will repeat in 6 months PD.  Followup neurology Osteoarthritis Dyslipidemia.  Continue simvastatin Hypothyroidism.

## 2013-12-15 ENCOUNTER — Ambulatory Visit: Payer: Medicare Other | Admitting: Cardiovascular Disease

## 2013-12-15 ENCOUNTER — Ambulatory Visit: Payer: Medicare Other | Admitting: Internal Medicine

## 2013-12-15 LAB — HEMOGLOBIN A1C: Hgb A1c MFr Bld: 6.6 % — ABNORMAL HIGH (ref 4.6–6.5)

## 2013-12-20 ENCOUNTER — Ambulatory Visit (INDEPENDENT_AMBULATORY_CARE_PROVIDER_SITE_OTHER): Payer: Medicare Other | Admitting: Cardiovascular Disease

## 2013-12-20 ENCOUNTER — Encounter: Payer: Self-pay | Admitting: Cardiovascular Disease

## 2013-12-20 VITALS — BP 108/60 | HR 62 | Resp 16 | Ht 69.0 in | Wt 177.8 lb

## 2013-12-20 DIAGNOSIS — I4589 Other specified conduction disorders: Secondary | ICD-10-CM | POA: Diagnosis not present

## 2013-12-20 DIAGNOSIS — Z79899 Other long term (current) drug therapy: Secondary | ICD-10-CM | POA: Diagnosis not present

## 2013-12-20 DIAGNOSIS — D689 Coagulation defect, unspecified: Secondary | ICD-10-CM

## 2013-12-20 DIAGNOSIS — I498 Other specified cardiac arrhythmias: Secondary | ICD-10-CM

## 2013-12-20 DIAGNOSIS — I495 Sick sinus syndrome: Secondary | ICD-10-CM

## 2013-12-20 DIAGNOSIS — R5381 Other malaise: Secondary | ICD-10-CM

## 2013-12-20 DIAGNOSIS — G9089 Other disorders of autonomic nervous system: Secondary | ICD-10-CM

## 2013-12-20 DIAGNOSIS — R5383 Other fatigue: Secondary | ICD-10-CM

## 2013-12-20 DIAGNOSIS — I6529 Occlusion and stenosis of unspecified carotid artery: Secondary | ICD-10-CM | POA: Diagnosis not present

## 2013-12-20 DIAGNOSIS — R001 Bradycardia, unspecified: Secondary | ICD-10-CM

## 2013-12-20 DIAGNOSIS — G908 Other disorders of autonomic nervous system: Secondary | ICD-10-CM | POA: Insufficient documentation

## 2013-12-20 DIAGNOSIS — G909 Disorder of the autonomic nervous system, unspecified: Secondary | ICD-10-CM

## 2013-12-20 NOTE — Progress Notes (Signed)
Patient ID: Stephen Robbins, male   DOB: [birth year], 78 y.o.   MRN: [id number]     Reason for office visit Discuss pacemaker  Stephen Robbins is a 78 year old man with Holter monitor and treadmill test evidence of chronotropic incompetence but no structural heart disease by echocardiography and stress testing who returns to discuss pacemaker therapy.  This year he has been diagnosed with Parkinson's disease. This is still very mild and has improved with levodopa therapy. He participates in special exercises for patients with Parkinson's disease. He finds that he is always lagging behind all the other patients, even though they share the same diagnosis. He often feels lightheaded at the end of exercise period. When he tries to exercise his legs feel heavy. On the treadmill his heart rate never exceeds 100 beats per minute even when he gives at all he's got.   Allergies  Allergen Reactions  . Penicillins Other (See Comments)    Unknown childhood reaction    Current Outpatient Prescriptions  Medication Sig Dispense Refill  . ALPRAZolam (XANAX) 0.25 MG tablet       . aspirin 81 MG tablet Take 81 mg by mouth daily.      . B Complex Vitamins (B COMPLEX 50) TABS Take 1 tablet by mouth every morning.       . benzonatate (TESSALON) 100 MG capsule Take 100 mg by mouth 3 (three) times daily as needed for cough.      . bisacodyl (DULCOLAX) 5 MG EC tablet Take 5 mg by mouth daily.       . Canagliflozin (INVOKANA) 100 MG TABS Take 1 tablet (100 mg total) by mouth daily.  30 tablet  11  . carbidopa-levodopa (SINEMET IR) 25-100 MG per tablet Take 1 tablet by mouth 3 (three) times daily.  270 tablet  3  . fish oil-omega-3 fatty acids 1000 MG capsule Take 2 g by mouth daily.      . folic acid (FOLVITE) 1 MG tablet Take 1 tablet (1 mg total) by mouth daily.  90 tablet  3  . Glucosamine-Chondroit-Vit C-Mn (GLUCOSAMINE CHONDR 500 COMPLEX) CAPS Take 1 capsule by mouth daily.      . hydrocortisone 0.5 % cream Apply 1  application topically 2 (two) times daily as needed. rash      . hydrocortisone-pramoxine (ANALPRAM-HC) 2.5-1 % rectal cream Place rectally as directed.  30 g  6  . ibuprofen (ADVIL,MOTRIN) 200 MG tablet Take 200 mg by mouth every 6 (six) hours as needed for pain.      Marland Kitchen levothyroxine (SYNTHROID, LEVOTHROID) 50 MCG tablet Take 1 tablet (50 mcg total) by mouth daily before breakfast.  90 tablet  3  . mirabegron ER (MYRBETRIQ) 25 MG TB24 tablet Take 25 mg by mouth daily.      Vladimir Faster Glycol-Propyl Glycol (SYSTANE ULTRA) 0.4-0.3 % SOLN Place 1 drop into both eyes daily as needed.       . sertraline (ZOLOFT) 25 MG tablet take 1 tablet by mouth once daily  90 tablet  1  . simvastatin (ZOCOR) 20 MG tablet TAKE 1 TABLET EACH EVENING  90 tablet  3  . tadalafil (CIALIS) 5 MG tablet Take 5 mg by mouth daily as needed for erectile dysfunction.      . tamsulosin (FLOMAX) 0.4 MG CAPS capsule take 1 capsule by mouth once daily      . Testosterone (AXIRON) 30 MG/ACT SOLN place 2 applicatorfuls ONTO SKIN once daily as directed  90 mL  3  . vitamin B-12 (CYANOCOBALAMIN) 1000 MCG tablet Take 1,000 mcg by mouth daily.       No current facility-administered medications for this visit.    Past Medical History  Diagnosis Date  . Hyperlipidemia   . Hemorrhoids   . Arthritis   . Low back pain   . Hypothyroidism   . Asymptomatic bilateral carotid artery stenosis     per duplex  05-15-2012  left >39%/   right 40-59%  . Bradycardia   . Fatigue   . Dizzy   . BPH (benign prostatic hypertrophy) with urinary obstruction   . NSVT (nonsustained ventricular tachycardia)     cardiologist-  dr Valeria Krisko  . Chronotropic incompetence with sinus node dysfunction   . Borderline diabetes   . GERD (gastroesophageal reflux disease)     occasional  . Compression of lumbar vertebra     L4 -- L5  . Compressed cervical disc   . History of basal cell carcinoma excision   . Frequency of urination   . Urgency of urination    . Nocturia   . Wears glasses     Past Surgical History  Procedure Laterality Date  . Carpal tunnel release Left 02-09-2010    ULNAR NERVE RELEASE  . Closed reduction nasal fx  09-01-2007  . Exercise tolerence test  12-03-2012  DR CROITURO    CHRONOTROPIC INCOMPETENCE/ NORMAL RESTING BP W/ APPROPRIATE RESPONSE/ NO CHEST PAIN/ NO ST CHANGES FROM BASELINE  . Right middle finger surgery  1978  . Tonsillectomy and adenoidectomy  AGE 69  . Cystoscopy N/A 12/28/2012    Procedure: CYSTOSCOPY;  Surgeon: Bernestine Amass, MD;  Location: [facility];  Service: Urology;  Laterality: N/A;  . Transurethral incision of prostate N/A 12/28/2012    Procedure: TRANSURETHRAL INCISION OF THE PROSTATE (TUIP);  Surgeon: Bernestine Amass, MD;  Location: [facility];  Service: Urology;  Laterality: N/A;  . Ulnar nerve transposition      Family History  Problem Relation Age of Onset  . Stroke Father   . Lung cancer Mother     History   Social History  . Marital Status: Married    Spouse Name: N/A    Number of Children: 0  . Years of Education: N/A   Occupational History  . retired     Programme researcher, broadcasting/film/video    Social History Main Topics  . Smoking status: Former Smoker    Types: Cigarettes    Quit date: 03/25/1968  . Smokeless tobacco: Never Used  . Alcohol Use: 4.2 oz/week    7 Glasses of wine per week     Comment: 1 glass wine daily  . Drug Use: No  . Sexual Activity: Not on file   Other Topics Concern  . Not on file   Social History Narrative   Regular exercise          Review of systems: Fatigue, exertional dyspnea, exertional dizziness (especially after exercise). No syncope. The patient specifically denies any chest pain at rest or with exertion, dyspnea at rest, orthopnea, paroxysmal nocturnal dyspnea, syncope, palpitations, focal neurological deficits, intermittent claudication, lower extremity edema, unexplained weight gain, cough, hemoptysis or wheezing.  The  patient also denies abdominal pain, nausea, vomiting, dysphagia, diarrhea, constipation, polyuria, polydipsia, dysuria, hematuria, frequency, urgency, abnormal bleeding or bruising, fever, chills, unexpected weight changes, mood swings, change in skin or hair texture, change in voice quality, auditory or visual problems, allergic reactions or rashes, new musculoskeletal complaints other  than usual "aches and pains".   PHYSICAL EXAM BP 108/60  Pulse 62  Resp 16  Ht 5\' 9"  (1.753 m)  Wt 80.65 kg (177 lb 12.8 oz)  BMI 26.24 kg/m2  General: Alert, oriented x3, no distress Head: no evidence of trauma, PERRL, EOMI, no exophtalmos or lid lag, no myxedema, no xanthelasma; normal ears, nose and oropharynx Neck: normal jugular venous pulsations and no hepatojugular reflux; brisk carotid pulses without delay and no carotid bruits Chest: clear to auscultation, no signs of consolidation by percussion or palpation, normal fremitus, symmetrical and full respiratory excursions Cardiovascular: normal position and quality of the apical impulse, regular rhythm, normal first and second heart sounds, no murmurs, rubs or gallops Abdomen: no tenderness or distention, no masses by palpation, no abnormal pulsatility or arterial bruits, normal bowel sounds, no hepatosplenomegaly Extremities: no clubbing, cyanosis or edema; 2+ radial, ulnar and brachial pulses bilaterally; 2+ right femoral, posterior tibial and dorsalis pedis pulses; 2+ left femoral, posterior tibial and dorsalis pedis pulses; no subclavian or femoral bruits Neurological: grossly nonfocal   EKG: Sinus rhythm 62 beats per minute, left anterior fascicular block  Lipid Panel     Component Value Date/Time   CHOL 117 02/27/2011 1038   TRIG 126.0 02/27/2011 1038   HDL 29.70* 02/27/2011 1038   CHOLHDL 4 02/27/2011 1038   VLDL 25.2 02/27/2011 1038   LDLCALC 62 02/27/2011 1038    BMET    Component Value Date/Time   NA 138 08/09/2013 1313   K 4.3 08/09/2013  1313   CL 102 08/09/2013 1313   CO2 28 08/09/2013 1313   GLUCOSE 103* 08/09/2013 1313   GLUCOSE 108* 02/20/2006 0838   BUN 18 08/09/2013 1313   CREATININE 0.6 08/09/2013 1313   CREATININE 0.68 06/17/2013 1215   CALCIUM 10.0 08/09/2013 1313   GFRNONAA 121.77 09/26/2009 0859   GFRAA 123 09/10/2007 0832     ASSESSMENT AND PLAN  Stephen Robbins has evidence of sinus node dysfunction, mild sinus bradycardia, but primarily chronotropic incompetence. He gives the typical clinical symptoms for this disorder. He never was able to increase his heart rate to even 70% of maximum predicted for age either on his treadmill test or his 24-hour Holter monitor. He has hypothyroidism, but was adequately repleted as evidenced by a normal TSH level at the time when he was undergoing his cardiac workup.  It is difficult to say whether there is any connection between Parkinson syndrome and his chronotropic incompetence.  I believe she would benefit from implantation of a dual-chamber permanent pacemaker with a blended accelerometer/minute ventilation sensor. Unfortunately, such a device is not available in MRI conditional package. He will consult with his neurologist whether he needs an MRI before pacemaker implantation.  The risks and benefits and potential complications of the procedure were discussed in detail. He understands and wishes to proceed.  A single brief episode of asymptomatic nonsustained ventricular tachycardia was detected during previous monitoring. This is of uncertain clinical significance in the absence of significant structural heart disease.  Holli Humbles, MD, [place] [phone] office [phone] pager

## 2013-12-20 NOTE — Patient Instructions (Signed)
Your physician has recommended that you have a Stafford pacemaker inserted Tuesday January 04, 2014. A pacemaker is a small device that is placed under the skin of your chest or abdomen to help control abnormal heart rhythms. This device uses electrical pulses to prompt the heart to beat at a normal rate. Pacemakers are used to treat heart rhythms that are too slow. Wire (leads) are attached to the pacemaker that goes into the chambers of you heart. This is done in the hospital and usually requires and overnight stay. Please see the instruction sheet given to you today for more information.  PLEASE CALL YOUR NEUROLOGIST TO SEE IF YOU NEED A MRI DONE PRIOR TO HAVING THE PACEMAKER INSERTED.

## 2013-12-21 ENCOUNTER — Encounter: Payer: Self-pay | Admitting: Internal Medicine

## 2013-12-21 ENCOUNTER — Telehealth: Payer: Self-pay | Admitting: Cardiovascular Disease

## 2013-12-21 ENCOUNTER — Encounter: Payer: Self-pay | Admitting: Neurology

## 2013-12-21 NOTE — Telephone Encounter (Signed)
Please call,have some additional questions about the pacemaker.

## 2013-12-21 NOTE — Telephone Encounter (Signed)
We discussed this at length during his visit.  Obviously, no one can guarantee he will not need an MRI in the future. The device on the market that is MRI compatible can offer benefit for his condition, but is not the best available for his condition. That is all - it is a tradeoff.  I suggested that if an MRI is needed in the foreseeable future, it should be done BEFORE the pacemaker is put in. [facility]

## 2013-12-21 NOTE — Telephone Encounter (Signed)
Calling to discuss the pacemaker [name]) that is recommended.  His neurologist can not gaurantee he will never need a MRI.  But, Mr. Stephen Robbins thinks he needs the type [name] recommends. Tried to explain but did not feel I was able to go into as much detail as he wanted. Suggested he go online research chronotropic incompetence and the BS pacer. If he still has questions to please call back.

## 2013-12-21 NOTE — Telephone Encounter (Signed)
Pt called in stating that he had some questions about Dr. Lurline Del recommendation on getting a pace maker. He said that he would be leaving the house for a couple of hours so please leave a VM  Thanks

## 2013-12-22 NOTE — Telephone Encounter (Signed)
At this point, I dont need an MRI as I explained to the patient yesterday.  We also have discussed these issues and many others at length.

## 2013-12-27 ENCOUNTER — Ambulatory Visit (INDEPENDENT_AMBULATORY_CARE_PROVIDER_SITE_OTHER): Payer: Medicare Other

## 2013-12-27 ENCOUNTER — Other Ambulatory Visit: Payer: Self-pay | Admitting: *Deleted

## 2013-12-27 DIAGNOSIS — I4589 Other specified conduction disorders: Secondary | ICD-10-CM

## 2013-12-27 DIAGNOSIS — Z23 Encounter for immunization: Secondary | ICD-10-CM

## 2013-12-29 DIAGNOSIS — D689 Coagulation defect, unspecified: Secondary | ICD-10-CM | POA: Diagnosis not present

## 2013-12-29 DIAGNOSIS — Z79899 Other long term (current) drug therapy: Secondary | ICD-10-CM | POA: Diagnosis not present

## 2013-12-30 ENCOUNTER — Ambulatory Visit: Payer: Medicare Other | Admitting: Physical Therapy

## 2013-12-30 ENCOUNTER — Encounter (HOSPITAL_COMMUNITY): Payer: Self-pay | Admitting: Pharmacy Technician

## 2013-12-30 ENCOUNTER — Encounter: Payer: Self-pay | Admitting: Cardiovascular Disease

## 2013-12-30 LAB — CBC
HCT: 46.8 % (ref 39.0–52.0)
Hemoglobin: 16.8 g/dL (ref 13.0–17.0)
MCH: 35.9 pg — ABNORMAL HIGH (ref 26.0–34.0)
MCHC: 35.9 g/dL (ref 30.0–36.0)
MCV: 100 fL (ref 78.0–100.0)
Platelets: 158 10*3/uL (ref 150–400)
RBC: 4.68 MIL/uL (ref 4.22–5.81)
RDW: 13.7 % (ref 11.5–15.5)
WBC: 6.3 10*3/uL (ref 4.0–10.5)

## 2013-12-30 LAB — APTT: aPTT: 30 seconds (ref 24–37)

## 2013-12-30 LAB — BASIC METABOLIC PANEL
BUN: 16 mg/dL (ref 6–23)
CO2: 25 mEq/L (ref 19–32)
Calcium: 9.6 mg/dL (ref 8.4–10.5)
Chloride: 102 mEq/L (ref 96–112)
Creat: 0.77 mg/dL (ref 0.50–1.35)
Glucose, Bld: 102 mg/dL — ABNORMAL HIGH (ref 70–99)
Potassium: 4.1 mEq/L (ref 3.5–5.3)
Sodium: 137 mEq/L (ref 135–145)

## 2013-12-30 LAB — PROTIME-INR
INR: 1.09 (ref <1.50)
Prothrombin Time: 14.1 seconds (ref 11.6–15.2)

## 2014-01-03 DIAGNOSIS — I4589 Other specified conduction disorders: Secondary | ICD-10-CM | POA: Diagnosis not present

## 2014-01-03 DIAGNOSIS — Z87891 Personal history of nicotine dependence: Secondary | ICD-10-CM | POA: Diagnosis not present

## 2014-01-03 DIAGNOSIS — E785 Hyperlipidemia, unspecified: Secondary | ICD-10-CM | POA: Diagnosis not present

## 2014-01-03 DIAGNOSIS — E039 Hypothyroidism, unspecified: Secondary | ICD-10-CM | POA: Diagnosis not present

## 2014-01-03 DIAGNOSIS — I495 Sick sinus syndrome: Secondary | ICD-10-CM | POA: Diagnosis not present

## 2014-01-03 DIAGNOSIS — G20 Parkinson's disease: Secondary | ICD-10-CM | POA: Diagnosis not present

## 2014-01-03 DIAGNOSIS — Z7982 Long term (current) use of aspirin: Secondary | ICD-10-CM | POA: Diagnosis not present

## 2014-01-03 DIAGNOSIS — Z79899 Other long term (current) drug therapy: Secondary | ICD-10-CM | POA: Diagnosis not present

## 2014-01-03 DIAGNOSIS — K219 Gastro-esophageal reflux disease without esophagitis: Secondary | ICD-10-CM | POA: Diagnosis not present

## 2014-01-03 DIAGNOSIS — Z7952 Long term (current) use of systemic steroids: Secondary | ICD-10-CM | POA: Diagnosis not present

## 2014-01-03 MED ORDER — SODIUM CHLORIDE 0.9 % IJ SOLN: 3.0000 mL | INTRAMUSCULAR | Status: DC | PRN

## 2014-01-03 MED ORDER — SODIUM CHLORIDE 0.9 % IV SOLN
INTRAVENOUS | Status: DC
  Administered 2014-01-04: 15:00:00 via INTRAVENOUS

## 2014-01-03 MED ORDER — SODIUM CHLORIDE 0.9 % IR SOLN
80.0000 mg | Status: DC
  Filled 2014-01-03: qty 2

## 2014-01-03 MED ORDER — VANCOMYCIN HCL IN DEXTROSE 1-5 GM/200ML-% IV SOLN
1000.0000 mg | INTRAVENOUS | Status: DC
  Filled 2014-01-03: qty 200

## 2014-01-03 MED ORDER — MUPIROCIN 2 % EX OINT
1.0000 "application " | TOPICAL_OINTMENT | Freq: Once | CUTANEOUS | Status: AC
  Administered 2014-01-04: 1 via TOPICAL
  Filled 2014-01-03: qty 22

## 2014-01-04 ENCOUNTER — Ambulatory Visit (HOSPITAL_COMMUNITY)
Admission: RE | Admit: 2014-01-04 | Discharge: 2014-01-05 | Disposition: A | Discharge status: Home / Self Care | Payer: Medicare Other | Source: Ambulatory Visit | Attending: Cardiovascular Disease | Admitting: Cardiovascular Disease

## 2014-01-04 ENCOUNTER — Encounter (HOSPITAL_COMMUNITY): Payer: Self-pay | Admitting: General Practice

## 2014-01-04 ENCOUNTER — Encounter (HOSPITAL_COMMUNITY)
Admission: RE | Disposition: A | Discharge status: Home / Self Care | Payer: Medicare Other | Source: Ambulatory Visit | Attending: Cardiovascular Disease

## 2014-01-04 DIAGNOSIS — Z7952 Long term (current) use of systemic steroids: Secondary | ICD-10-CM | POA: Insufficient documentation

## 2014-01-04 DIAGNOSIS — Z87891 Personal history of nicotine dependence: Secondary | ICD-10-CM | POA: Insufficient documentation

## 2014-01-04 DIAGNOSIS — K219 Gastro-esophageal reflux disease without esophagitis: Secondary | ICD-10-CM | POA: Insufficient documentation

## 2014-01-04 DIAGNOSIS — E785 Hyperlipidemia, unspecified: Secondary | ICD-10-CM | POA: Insufficient documentation

## 2014-01-04 DIAGNOSIS — I4589 Other specified conduction disorders: Secondary | ICD-10-CM

## 2014-01-04 DIAGNOSIS — G20 Parkinson's disease: Secondary | ICD-10-CM | POA: Insufficient documentation

## 2014-01-04 DIAGNOSIS — Z79899 Other long term (current) drug therapy: Secondary | ICD-10-CM | POA: Insufficient documentation

## 2014-01-04 DIAGNOSIS — E039 Hypothyroidism, unspecified: Secondary | ICD-10-CM | POA: Insufficient documentation

## 2014-01-04 DIAGNOSIS — I495 Sick sinus syndrome: Secondary | ICD-10-CM

## 2014-01-04 DIAGNOSIS — R001 Bradycardia, unspecified: Secondary | ICD-10-CM

## 2014-01-04 DIAGNOSIS — G908 Other disorders of autonomic nervous system: Secondary | ICD-10-CM

## 2014-01-04 DIAGNOSIS — Z7982 Long term (current) use of aspirin: Secondary | ICD-10-CM | POA: Insufficient documentation

## 2014-01-04 DIAGNOSIS — G909 Disorder of the autonomic nervous system, unspecified: Secondary | ICD-10-CM

## 2014-01-04 DIAGNOSIS — Z950 Presence of cardiac pacemaker: Secondary | ICD-10-CM

## 2014-01-04 DIAGNOSIS — E1142 Type 2 diabetes mellitus with diabetic polyneuropathy: Secondary | ICD-10-CM

## 2014-01-04 HISTORY — DX: Metabolic syndrome: E88.81

## 2014-01-04 HISTORY — PX: PERMANENT PACEMAKER INSERTION: SHX5480

## 2014-01-04 HISTORY — DX: Bronchopneumonia, unspecified organism: J18.0

## 2014-01-04 HISTORY — DX: Presence of cardiac pacemaker: Z95.0

## 2014-01-04 HISTORY — DX: Basal cell carcinoma of skin, unspecified: C44.91

## 2014-01-04 HISTORY — DX: Metabolic syndrome: E88.810

## 2014-01-04 HISTORY — PX: INSERT / REPLACE / REMOVE PACEMAKER: SUR710

## 2014-01-04 LAB — GLUCOSE, CAPILLARY: Glucose-Capillary: 92 mg/dL (ref 70–99)

## 2014-01-04 LAB — SURGICAL PCR SCREEN
MRSA, PCR: NEGATIVE
Staphylococcus aureus: NEGATIVE

## 2014-01-04 SURGERY — PERMANENT PACEMAKER INSERTION
Anesthesia: LOCAL

## 2014-01-04 MED ORDER — ONDANSETRON HCL 4 MG/2ML IJ SOLN: 4.0000 mg | Freq: Four times a day (QID) | INTRAMUSCULAR | Status: DC | PRN

## 2014-01-04 MED ORDER — FOLIC ACID 1 MG PO TABS
1.0000 mg | ORAL_TABLET | Freq: Every morning | ORAL | Status: DC
  Filled 2014-01-04: qty 1

## 2014-01-04 MED ORDER — MIRABEGRON ER 25 MG PO TB24
25.0000 mg | ORAL_TABLET | Freq: Every day | ORAL | Status: DC
Start: 2014-01-04 — End: 2014-01-05
  Administered 2014-01-04: 25 mg via ORAL
  Filled 2014-01-04 (×3): qty 1

## 2014-01-04 MED ORDER — TAMSULOSIN HCL 0.4 MG PO CAPS
0.4000 mg | ORAL_CAPSULE | Freq: Every morning | ORAL | Status: DC
  Filled 2014-01-04: qty 1

## 2014-01-04 MED ORDER — HEPARIN (PORCINE) IN NACL 2-0.9 UNIT/ML-% IJ SOLN
INTRAMUSCULAR | Status: AC
  Filled 2014-01-04: qty 500

## 2014-01-04 MED ORDER — FENTANYL CITRATE 0.05 MG/ML IJ SOLN
INTRAMUSCULAR | Status: AC
  Filled 2014-01-04: qty 2

## 2014-01-04 MED ORDER — HYDROCORTISONE 0.5 % EX CREA
1.0000 "application " | TOPICAL_CREAM | Freq: Two times a day (BID) | CUTANEOUS | Status: DC
  Administered 2014-01-05: 1 via TOPICAL
  Filled 2014-01-04: qty 28.35

## 2014-01-04 MED ORDER — SODIUM CHLORIDE 0.9 % IV SOLN: INTRAVENOUS | Status: AC

## 2014-01-04 MED ORDER — MIDAZOLAM HCL 5 MG/5ML IJ SOLN
INTRAMUSCULAR | Status: AC
  Filled 2014-01-04: qty 5

## 2014-01-04 MED ORDER — CANAGLIFLOZIN 100 MG PO TABS: 1.0000 | ORAL_TABLET | Freq: Every morning | ORAL | Status: DC

## 2014-01-04 MED ORDER — YOU HAVE A PACEMAKER BOOK
Freq: Once | Status: AC
  Administered 2014-01-05: 02:00:00
  Filled 2014-01-04: qty 1

## 2014-01-04 MED ORDER — ASPIRIN 81 MG PO CHEW
81.0000 mg | CHEWABLE_TABLET | Freq: Every day | ORAL | Status: DC
  Filled 2014-01-04: qty 1

## 2014-01-04 MED ORDER — SERTRALINE HCL 25 MG PO TABS
25.0000 mg | ORAL_TABLET | Freq: Every morning | ORAL | Status: DC
  Filled 2014-01-04: qty 1

## 2014-01-04 MED ORDER — ALPRAZOLAM 0.25 MG PO TABS: 0.2500 mg | ORAL_TABLET | Freq: Two times a day (BID) | ORAL | Status: DC | PRN

## 2014-01-04 MED ORDER — BENZONATATE 100 MG PO CAPS
100.0000 mg | ORAL_CAPSULE | Freq: Every day | ORAL | Status: DC | PRN
  Filled 2014-01-04: qty 1

## 2014-01-04 MED ORDER — LIDOCAINE HCL (PF) 1 % IJ SOLN
INTRAMUSCULAR | Status: AC
  Filled 2014-01-04: qty 60

## 2014-01-04 MED ORDER — CANAGLIFLOZIN 100 MG PO TABS
100.0000 mg | ORAL_TABLET | Freq: Every day | ORAL | Status: DC
  Administered 2014-01-05: 100 mg via ORAL
  Filled 2014-01-04 (×2): qty 1

## 2014-01-04 MED ORDER — VANCOMYCIN HCL IN DEXTROSE 1-5 GM/200ML-% IV SOLN
1000.0000 mg | Freq: Two times a day (BID) | INTRAVENOUS | Status: AC
  Administered 2014-01-05: 02:00:00 1000 mg via INTRAVENOUS
  Filled 2014-01-04: qty 200

## 2014-01-04 MED ORDER — LEVOTHYROXINE SODIUM 50 MCG PO TABS
50.0000 ug | ORAL_TABLET | Freq: Every day | ORAL | Status: DC
  Administered 2014-01-05: 07:00:00 50 ug via ORAL
  Filled 2014-01-04 (×3): qty 1

## 2014-01-04 MED ORDER — SIMVASTATIN 20 MG PO TABS
20.0000 mg | ORAL_TABLET | Freq: Every evening | ORAL | Status: DC
  Administered 2014-01-04: 20 mg via ORAL
  Filled 2014-01-04 (×2): qty 1

## 2014-01-04 MED ORDER — MUPIROCIN 2 % EX OINT
TOPICAL_OINTMENT | CUTANEOUS | Status: AC
  Administered 2014-01-04: 1 via TOPICAL
  Filled 2014-01-04: qty 22

## 2014-01-04 MED ORDER — ACETAMINOPHEN 325 MG PO TABS
325.0000 mg | ORAL_TABLET | ORAL | Status: DC | PRN
  Administered 2014-01-05: 02:00:00 650 mg via ORAL
  Filled 2014-01-04: qty 2

## 2014-01-04 MED ORDER — CARBIDOPA-LEVODOPA 25-100 MG PO TABS
1.0000 | ORAL_TABLET | Freq: Three times a day (TID) | ORAL | Status: DC
  Administered 2014-01-04 – 2014-01-05 (×2): 1 via ORAL
  Filled 2014-01-04 (×6): qty 1

## 2014-01-04 NOTE — H&P (View-Only) (Signed)
Patient ID: Stephen Robbins, male   DOB: [birth year], 78 y.o.   MRN: [id number]     Reason for office visit Discuss pacemaker  Stephen Robbins is a 80 year old man with Holter monitor and treadmill test evidence of chronotropic incompetence but no structural heart disease by echocardiography and stress testing who returns to discuss pacemaker therapy.  This year he has been diagnosed with Parkinson's disease. This is still very mild and has improved with levodopa therapy. He participates in special exercises for patients with Parkinson's disease. He finds that he is always lagging behind all the other patients, even though they share the same diagnosis. He often feels lightheaded at the end of exercise period. When he tries to exercise his legs feel heavy. On the treadmill his heart rate never exceeds 100 beats per minute even when he gives at all he's got.   Allergies  Allergen Reactions  . Penicillins Other (See Comments)    Unknown childhood reaction    Current Outpatient Prescriptions  Medication Sig Dispense Refill  . ALPRAZolam (XANAX) 0.25 MG tablet       . aspirin 81 MG tablet Take 81 mg by mouth daily.      . B Complex Vitamins (B COMPLEX 50) TABS Take 1 tablet by mouth every morning.       . benzonatate (TESSALON) 100 MG capsule Take 100 mg by mouth 3 (three) times daily as needed for cough.      . bisacodyl (DULCOLAX) 5 MG EC tablet Take 5 mg by mouth daily.       . Canagliflozin (INVOKANA) 100 MG TABS Take 1 tablet (100 mg total) by mouth daily.  30 tablet  11  . carbidopa-levodopa (SINEMET IR) 25-100 MG per tablet Take 1 tablet by mouth 3 (three) times daily.  270 tablet  3  . fish oil-omega-3 fatty acids 1000 MG capsule Take 2 g by mouth daily.      . folic acid (FOLVITE) 1 MG tablet Take 1 tablet (1 mg total) by mouth daily.  90 tablet  3  . Glucosamine-Chondroit-Vit C-Mn (GLUCOSAMINE CHONDR 500 COMPLEX) CAPS Take 1 capsule by mouth daily.      . hydrocortisone 0.5 % cream Apply 1  application topically 2 (two) times daily as needed. rash      . hydrocortisone-pramoxine (ANALPRAM-HC) 2.5-1 % rectal cream Place rectally as directed.  30 g  6  . ibuprofen (ADVIL,MOTRIN) 200 MG tablet Take 200 mg by mouth every 6 (six) hours as needed for pain.      Marland Kitchen levothyroxine (SYNTHROID, LEVOTHROID) 50 MCG tablet Take 1 tablet (50 mcg total) by mouth daily before breakfast.  90 tablet  3  . mirabegron ER (MYRBETRIQ) 25 MG TB24 tablet Take 25 mg by mouth daily.      Vladimir Faster Glycol-Propyl Glycol (SYSTANE ULTRA) 0.4-0.3 % SOLN Place 1 drop into both eyes daily as needed.       . sertraline (ZOLOFT) 25 MG tablet take 1 tablet by mouth once daily  90 tablet  1  . simvastatin (ZOCOR) 20 MG tablet TAKE 1 TABLET EACH EVENING  90 tablet  3  . tadalafil (CIALIS) 5 MG tablet Take 5 mg by mouth daily as needed for erectile dysfunction.      . tamsulosin (FLOMAX) 0.4 MG CAPS capsule take 1 capsule by mouth once daily      . Testosterone (AXIRON) 30 MG/ACT SOLN place 2 applicatorfuls ONTO SKIN once daily as directed  90 mL  3  . vitamin B-12 (CYANOCOBALAMIN) 1000 MCG tablet Take 1,000 mcg by mouth daily.       No current facility-administered medications for this visit.    Past Medical History  Diagnosis Date  . Hyperlipidemia   . Hemorrhoids   . Arthritis   . Low back pain   . Hypothyroidism   . Asymptomatic bilateral carotid artery stenosis     per duplex  05-15-2012  left >39%/   right 40-59%  . Bradycardia   . Fatigue   . Dizzy   . BPH (benign prostatic hypertrophy) with urinary obstruction   . NSVT (nonsustained ventricular tachycardia)     cardiologist-  dr Antonyo Hinderer  . Chronotropic incompetence with sinus node dysfunction   . Borderline diabetes   . GERD (gastroesophageal reflux disease)     occasional  . Compression of lumbar vertebra     L4 -- L5  . Compressed cervical disc   . History of basal cell carcinoma excision   . Frequency of urination   . Urgency of urination    . Nocturia   . Wears glasses     Past Surgical History  Procedure Laterality Date  . Carpal tunnel release Left 02-09-2010    ULNAR NERVE RELEASE  . Closed reduction nasal fx  09-01-2007  . Exercise tolerence test  12-03-2012  DR CROITURO    CHRONOTROPIC INCOMPETENCE/ NORMAL RESTING BP W/ APPROPRIATE RESPONSE/ NO CHEST PAIN/ NO ST CHANGES FROM BASELINE  . Right middle finger surgery  1978  . Tonsillectomy and adenoidectomy  AGE 87  . Cystoscopy N/A 12/28/2012    Procedure: CYSTOSCOPY;  Surgeon: Bernestine Amass, MD;  Location: [facility];  Service: Urology;  Laterality: N/A;  . Transurethral incision of prostate N/A 12/28/2012    Procedure: TRANSURETHRAL INCISION OF THE PROSTATE (TUIP);  Surgeon: Bernestine Amass, MD;  Location[facility];  Service: Urology;  Laterality: N/A;  . Ulnar nerve transposition      Family History  Problem Relation Age of Onset  . Stroke Father   . Lung cancer Mother     History   Social History  . Marital Status: Married    Spouse Name: N/A    Number of Children: 0  . Years of Education: N/A   Occupational History  . retired     Programme researcher, broadcasting/film/video    Social History Main Topics  . Smoking status: Former Smoker    Types: Cigarettes    Quit date: 03/25/1968  . Smokeless tobacco: Never Used  . Alcohol Use: 4.2 oz/week    7 Glasses of wine per week     Comment: 1 glass wine daily  . Drug Use: No  . Sexual Activity: Not on file   Other Topics Concern  . Not on file   Social History Narrative   Regular exercise          Review of systems: Fatigue, exertional dyspnea, exertional dizziness (especially after exercise). No syncope. The patient specifically denies any chest pain at rest or with exertion, dyspnea at rest, orthopnea, paroxysmal nocturnal dyspnea, syncope, palpitations, focal neurological deficits, intermittent claudication, lower extremity edema, unexplained weight gain, cough, hemoptysis or wheezing.  The  patient also denies abdominal pain, nausea, vomiting, dysphagia, diarrhea, constipation, polyuria, polydipsia, dysuria, hematuria, frequency, urgency, abnormal bleeding or bruising, fever, chills, unexpected weight changes, mood swings, change in skin or hair texture, change in voice quality, auditory or visual problems, allergic reactions or rashes, new musculoskeletal complaints other  than usual "aches and pains".   PHYSICAL EXAM BP 108/60  Pulse 62  Resp 16  Ht 5\' 9"  (1.753 m)  Wt 80.65 kg (177 lb 12.8 oz)  BMI 26.24 kg/m2  General: Alert, oriented x3, no distress Head: no evidence of trauma, PERRL, EOMI, no exophtalmos or lid lag, no myxedema, no xanthelasma; normal ears, nose and oropharynx Neck: normal jugular venous pulsations and no hepatojugular reflux; brisk carotid pulses without delay and no carotid bruits Chest: clear to auscultation, no signs of consolidation by percussion or palpation, normal fremitus, symmetrical and full respiratory excursions Cardiovascular: normal position and quality of the apical impulse, regular rhythm, normal first and second heart sounds, no murmurs, rubs or gallops Abdomen: no tenderness or distention, no masses by palpation, no abnormal pulsatility or arterial bruits, normal bowel sounds, no hepatosplenomegaly Extremities: no clubbing, cyanosis or edema; 2+ radial, ulnar and brachial pulses bilaterally; 2+ right femoral, posterior tibial and dorsalis pedis pulses; 2+ left femoral, posterior tibial and dorsalis pedis pulses; no subclavian or femoral bruits Neurological: grossly nonfocal   EKG: Sinus rhythm 62 beats per minute, left anterior fascicular block  Lipid Panel     Component Value Date/Time   CHOL 117 02/27/2011 1038   TRIG 126.0 02/27/2011 1038   HDL 29.70* 02/27/2011 1038   CHOLHDL 4 02/27/2011 1038   VLDL 25.2 02/27/2011 1038   LDLCALC 62 02/27/2011 1038    BMET    Component Value Date/Time   NA 138 08/09/2013 1313   K 4.3 08/09/2013  1313   CL 102 08/09/2013 1313   CO2 28 08/09/2013 1313   GLUCOSE 103* 08/09/2013 1313   GLUCOSE 108* 02/20/2006 0838   BUN 18 08/09/2013 1313   CREATININE 0.6 08/09/2013 1313   CREATININE 0.68 06/17/2013 1215   CALCIUM 10.0 08/09/2013 1313   GFRNONAA 121.77 09/26/2009 0859   GFRAA 123 09/10/2007 0832     ASSESSMENT AND PLAN  Stephen Robbins has evidence of sinus node dysfunction, mild sinus bradycardia, but primarily chronotropic incompetence. He gives the typical clinical symptoms for this disorder. He never was able to increase his heart rate to even 70% of maximum predicted for age either on his treadmill test or his 24-hour Holter monitor. He has hypothyroidism, but was adequately repleted as evidenced by a normal TSH level at the time when he was undergoing his cardiac workup.  It is difficult to say whether there is any connection between Parkinson syndrome and his chronotropic incompetence.  I believe she would benefit from implantation of a dual-chamber permanent pacemaker with a blended accelerometer/minute ventilation sensor. Unfortunately, such a device is not available in MRI conditional package. He will consult with his neurologist whether he needs an MRI before pacemaker implantation.  The risks and benefits and potential complications of the procedure were discussed in detail. He understands and wishes to proceed.  A single brief episode of asymptomatic nonsustained ventricular tachycardia was detected during previous monitoring. This is of uncertain clinical significance in the absence of significant structural heart disease.  Holli Humbles, MD, [place] [phone] office [phone] pager

## 2014-01-04 NOTE — Op Note (Signed)
Procedure report  Procedure performed:  1. Implantation of new dual chamber permanent pacemaker 2. Fluoroscopy 3. Light sedation 4. Left upper extremity venography  Reason for procedure: Chronotropic incompetence  Procedure performed by: Sanda Klein, MD  Complications: None  Estimated blood loss: <10 mL  Medications administered during procedure: Vancomycin 1 g intravenously Lidocaine 1% 30 mL locally,  Fentanyl 75 mcg intravenously Versed 3 mg intravenously Omnipaque 10 mL IV  Device details: [employer] Essentio model L121 serial number [id number] Right atrial lead Guidant Dextrus 6659 serial number [id number] Right ventricular lead Guidant Dextrus 4136 serial number [id number]  Procedure details:  After the risks and benefits of the procedure were discussed the patient provided informed consent and was brought to the cardiac cath lab in the fasting state. The patient was prepped and draped in usual sterile fashion. Local anesthesia with 1% lidocaine was administered to to the right infraclavicular area. A 5-6 cm horizontal incision was made parallel with and 2-3 cm caudal to the right clavicle. Using electrocautery and blunt dissection a prepectoral pocket was created down to the level of the pectoralis major muscle fascia. The pocket was carefully inspected for hemostasis. An antibiotic-soaked sponge was placed in the pocket.  Under fluoroscopic guidance and using the modified Seldinger technique 2 separate venipunctures were performed to access the left subclavian vein. Some difficulty was encountered accessing the vein. A venogram was performed. Single access was obtained and using a sheath exchange technique, two J-tipped guidewires were subsequently exchanged for two 7 French safe sheaths.  Under fluoroscopic guidance the ventricular lead was advanced to level of the mid to apical right ventricular septum and thet active-fixation helix was deployed. Prominent  current of injury was seen. Satisfactory pacing and sensing parameters were recorded. There was no evidence of diaphragmatic stimulation at maximum device output. The safe sheath was peeled away and the lead was secured in place with 2-0 silk.  In similar fashion the right atrial lead was advanced to the level of the atrial appendage. The active-fixation helix was deployed. There was prominent current of injury. Satisfactory  pacing and sensing parameters were recorded. There was no evidence of diaphragmatic stimulation with pacing at maximum device output. The safe sheath was peeled away and the lead was secured in place with 2-0 silk.  The antibiotic-soaked sponge was removed from the pocket. The pocket was flushed with copious amounts of antibiotic solution. Reinspection showed excellent hemostasis..  The ventricular lead was connected to the generator and appropriate ventricular pacing was seen. Subsequently the atrial lead was also connected. Repeat testing of the lead parameters later showed excellent values.  The entire system was then carefully inserted in the pocket with care been taking that the leads and device assumed a comfortable position without pressure on the incision. Great care was taken that the leads be located deep to the generator. The pocket was then closed in layers using 2 layers of 2-0 Vicryl and cutaneous staples, after which a sterile dressing was applied.  At the end of the procedure the following lead parameters were encountered:  Right atrial lead  sensed P waves 6.8 mV, impedance 664 ohms, threshold 0.7 V at 0.4 ms pulse width.  Right ventricular lead  sensed R waves 10.7 mV, impedance 987 ohms, threshold 0.4 V at 0.4 ms pulse width.  Sanda Klein, MD, [facility] [phone] office [phone] pager

## 2014-01-04 NOTE — Interval H&P Note (Signed)
History and Physical Interval Note:  01/04/2014 2:56 PM  [name]  has presented today for surgery, with the diagnosis of bradicardia  The various methods of treatment have been discussed with the patient and family. After consideration of risks, benefits and other options for treatment, the patient has consented to  Procedure(s): PERMANENT PACEMAKER INSERTION (N/A) as a surgical intervention .  The patient's history has been reviewed, patient examined, no change in status, stable for surgery.  I have reviewed the patient's chart and labs.  Questions were answered to the patient's satisfaction.     Stephen Robbins

## 2014-01-05 ENCOUNTER — Ambulatory Visit (HOSPITAL_COMMUNITY): Payer: Medicare Other

## 2014-01-05 ENCOUNTER — Encounter (HOSPITAL_COMMUNITY): Payer: Self-pay | Admitting: Physician Assistant

## 2014-01-05 ENCOUNTER — Telehealth: Payer: Self-pay | Admitting: Cardiovascular Disease

## 2014-01-05 DIAGNOSIS — Z45018 Encounter for adjustment and management of other part of cardiac pacemaker: Secondary | ICD-10-CM | POA: Diagnosis not present

## 2014-01-05 DIAGNOSIS — I495 Sick sinus syndrome: Secondary | ICD-10-CM | POA: Diagnosis present

## 2014-01-05 DIAGNOSIS — I498 Other specified cardiac arrhythmias: Secondary | ICD-10-CM | POA: Diagnosis not present

## 2014-01-05 DIAGNOSIS — I4589 Other specified conduction disorders: Secondary | ICD-10-CM | POA: Diagnosis not present

## 2014-01-05 DIAGNOSIS — Z79899 Other long term (current) drug therapy: Secondary | ICD-10-CM | POA: Diagnosis not present

## 2014-01-05 DIAGNOSIS — Z7952 Long term (current) use of systemic steroids: Secondary | ICD-10-CM | POA: Diagnosis not present

## 2014-01-05 DIAGNOSIS — Z950 Presence of cardiac pacemaker: Secondary | ICD-10-CM

## 2014-01-05 DIAGNOSIS — Z7982 Long term (current) use of aspirin: Secondary | ICD-10-CM | POA: Diagnosis not present

## 2014-01-05 DIAGNOSIS — G20 Parkinson's disease: Secondary | ICD-10-CM | POA: Diagnosis not present

## 2014-01-05 DIAGNOSIS — R001 Bradycardia, unspecified: Secondary | ICD-10-CM

## 2014-01-05 LAB — GLUCOSE, CAPILLARY: Glucose-Capillary: 127 mg/dL — ABNORMAL HIGH (ref 70–99)

## 2014-01-05 MED ORDER — ACETAMINOPHEN 325 MG PO TABS: 325.0000 mg | ORAL_TABLET | ORAL | Status: DC | PRN

## 2014-01-05 NOTE — Progress Notes (Signed)
Patient Name: Stephen Robbins Date of Encounter: 01/05/2014  Principal Problem:   Chronotropic incompetence with sinus node dysfunction Active Problems:   Pacemaker - [employer] Essentio model L121 serial number [phone]    Patient Profile: 78 yo male w/ bradycardia, neg stress and nl echo, Parkinson's dz, hypothyroid, was dx w/ chronotropic incompetence and admitted 10/13 for PPM.  SUBJECTIVE: No chest pain, no SOB, questions about activity - playing golf, and magnetized name tags, among others. Soreness in right shoulder  OBJECTIVE Filed Vitals:   01/04/14 2117 01/05/14 0008 01/05/14 0351 01/05/14 0830  BP: 123/58 115/53 120/53 109/61  Pulse: 64 60 62 69  Temp: 97.5 F (36.4 C) 98.2 F (36.8 C) 98.6 F (37 C) 98.5 F (36.9 C)  TempSrc: Oral Oral Oral Oral  Resp: 18 18 20 20   Height:      Weight:  170 lb 13.7 oz (77.5 kg)    SpO2: 95% 96% 96% 96%    Intake/Output Summary (Last 24 hours) at 01/05/14 0854 Last data filed at 01/05/14 0846  Gross per 24 hour  Intake    680 ml  Output    300 ml  Net    380 ml   Filed Weights   01/04/14 1328 01/05/14 0008  Weight: 170 lb (77.111 kg) 170 lb 13.7 oz (77.5 kg)    PHYSICAL EXAM General: Well developed, well nourished, male in no acute distress. Head: Normocephalic, atraumatic.  Neck: Supple without bruits, JVD not elevated. Lungs:  Resp regular and unlabored, CTA. PPM R shoulder, small amt ecchymosis, no hematoma Heart: RRR, S1, S2, no S3, S4, or murmur; no rub. Abdomen: Soft, non-tender, non-distended, BS + x 4.  Extremities: No clubbing, cyanosis, no edema.  Neuro: Alert and oriented X 3. Moves all extremities spontaneously. Psych: Normal affect.  LABS:none  TELE:   A pacing  ECG: 01/05/2014 A-pacing Vent. rate 64 BPM PR interval 192 ms QRS duration 124 ms QT/QTc 402/414 ms P-R-T axes -3 -57 78  Procedure performed:  1. Implantation of new dual chamber permanent pacemaker  2. Fluoroscopy  3.  Light sedation  4. Left upper extremity venography Device details:  [employer] Essentio model [id number] serial number [id number]  Right atrial lead Guidant Dextrus 5643 serial number [id number]  Right ventricular lead Guidant Dextrus 4136 serial number [id number]  Radiology/Studies: Dg Chest 2 View  01/05/2014   CLINICAL DATA:  Status post pacemaker placement complaining of soreness in the right arm.  EXAM: CHEST  2 VIEW  COMPARISON:  Chest x-ray 12/27/2011.  FINDINGS: New right-sided pacemaker with leads extending via right subclavian approach into the heart, with tips projecting over the expected location of the right atrium and right ventricular apex. No pneumothorax. No acute consolidative airspace disease. No pleural effusions. No evidence of pulmonary edema. Heart size and mediastinal contours are within normal limits. Skin staples are seen overlying the generator pocket.  IMPRESSION: 1. Status post right-sided pacemaker placement, as above, without acute complicating features. 2. No radiographic evidence of acute cardiopulmonary disease.   Electronically Signed   By: Vinnie Langton M.D.   On: 01/05/2014 08:05     Current Medications:  . aspirin  81 mg Oral Daily  . Canagliflozin  100 mg Oral Daily  . carbidopa-levodopa  1 tablet Oral TID AC  . folic acid  1 mg Oral q morning - 10a  . hydrocortisone cream  1 application Topical BID  . levothyroxine  50 mcg Oral QAC breakfast  .  mirabegron ER  25 mg Oral Daily  . sertraline  25 mg Oral q morning - 10a  . simvastatin  20 mg Oral QPM  . tamsulosin  0.4 mg Oral q morning - 10a      ASSESSMENT AND PLAN: Principal Problem:   Chronotropic incompetence with sinus node dysfunction - s/p PPM, f/u on CXR, get device interrogated, plan d/c today w/ OP wound check and f/u visit.  Active Problems:   Pacemaker - [employer] Essentio model L121 serial number [id number]  Plan - d/c later today.  SignedRosaria Ferries , PA-C 8:54  AM 01/05/2014  Personally seen and examined. Agree with above. OK for DC. Discussed with Dr. Sallyanne Kuster. ?'s answered.  Candee Furbish, MD

## 2014-01-05 NOTE — Telephone Encounter (Signed)
Spoke with Stephen Baas, NP - patient should take bandage off at home in 48 hours (post-procedure) and then come to office 7-10 days after pacemaker insertion for site check and staple removal.   Returned call to patient and explained this. He states that [name] was insistent that he take the bandage off in 48 hours, which RN reiterated is in fact correct. He said the nurse told him he needed to come to the office for this in 48 hours. He states "I don't know what I'll see under there" (meaning bandage) and expressed that he was not comfortable taking the bandage off at home by hisself. I explained that there would be staples and that he would only need to contact us if there was bleeding, redness, irritation, s/sx of infection and then come in 7-10 days for staple removal. Patient said he was upset and would talk to [name] about this as he was insistent on that he HAD to come to office for bandage removal. I tried to explain this was not entirely necessary but that he could come in for nurse visit appointment tomorrow at 10am. Patient agreed to this.

## 2014-01-05 NOTE — Discharge Instructions (Signed)
Supplemental Discharge Instructions for  Pacemaker/Defibrillator Patients  Activity Do not raise your left/right arm above shoulder level or extend it backward beyond shoulder level for 2 weeks. Wear the arm sling as a reminder or as needed for comfort for 2 weeks. No heavy lifting or vigorous activity with your left/right arm for 6-8 weeks.    NO DRIVING is preferable for 2 weeks; If absolutely necessary, drive only short, familiar routes. DO wear your seatbelt, even if it crosses over the pacemaker site.  WOUND CARE   Keep the wound area clean and dry.  Remove the dressing the day after you return home (usually 48 hours after the procedure).   DO NOT SUBMERGE UNDER WATER UNTIL FULLY HEALED (no tub baths, hot tubs, swimming pools, etc.).    You  may shower or take a sponge bath after the dressing is removed. DO NOT SOAK the area and do not allow the shower to directly spray on the site.   If you have staples, these will be removed in the office in 7-14 days.   If you have tape/steri-strips on your wound, these will fall off; do not pull them off prematurely.     No bandage is needed on the site.  DO  NOT apply any creams, oils, or ointments to the wound area.   If you notice any drainage or discharge from the wound, any swelling, excessive redness or bruising at the site, or if you develop a fever > 101? F after you are discharged home, call the office at once.  Special Instructions   You are still able to use cellular telephones.  Avoid carrying your cellular phone near your device.   When traveling through airports, show security personnel your identification card to avoid being screened in the metal detectors.    Avoid arc welding equipment, MRI testing (magnetic resonance imaging), TENS units (transcutaneous nerve stimulators).  Call the office for questions about other devices.   Avoid electrical appliances that are in poor condition or are not properly grounded.   Microwave ovens are  safe to be near or to operate.    Gradually raise your affected arm as drawn below.                       10/16                   10/17                    10/18                    10/19

## 2014-01-05 NOTE — Telephone Encounter (Signed)
Patient had pacemaker put in yesterday.  He was instructed to come and have bandage removed in 48 hours. I questioned bandage and he stated he was instructed to come in.  He is unable to remove and says he can see it's bleeding some.

## 2014-01-05 NOTE — Discharge Summary (Signed)
Personally seen and examined. Agree with above. Imer Foxworth, MD  

## 2014-01-05 NOTE — Care Management Note (Addendum)
  Page 1 of 1   01/05/2014     10:16:19 AM CARE MANAGEMENT NOTE 01/05/2014  Patient:  Stephen Robbins, Stephen Robbins   Account Number:  [id number]  Date Initiated:  01/05/2014  Documentation initiated by:  Mariann Laster  Subjective/Objective Assessment:   Bradicardia     Action/Plan:   CM to follow for disposition needs   Anticipated DC Date:  01/06/2014   Anticipated DC Plan:  HOME/SELF CARE         Choice offered to / List presented to:             Status of service:  Completed, signed off Medicare Important Message given?   (If response is "NO", the following Medicare IM given date fields will be blank) Date Medicare IM given:   Medicare IM given by:   Date Additional Medicare IM given:   Additional Medicare IM given by:    Discharge Disposition:  HOME/SELF CARE  Per UR Regulation:    If discussed at Long Length of Stay Meetings, dates discussed:    Comments:  Carlito Bogert RN, BSN, MSHL, CCM  Nurse - Case Manager,  (Unit [phone]  01/05/2014

## 2014-01-05 NOTE — Discharge Summary (Addendum)
CARDIOLOGY DISCHARGE SUMMARY   Patient ID: Stephen Robbins MRN: [id number] DOB/AGE: 05-07-1935 78 y.o.  Admit date: 01/04/2014 Discharge date: 01/05/2014  PCP: Nyoka Cowden, MD Primary Cardiologist: Dr. Sallyanne Kuster  Primary Discharge Diagnosis:   Sinus node dysfunction, symptomatic bradycardia, chronotropic incompetence  Secondary Discharge Diagnosis:    Pacemaker - [employer] Essentio model L121 serial number [id number]  Procedure performed:  1. Implantation of new dual chamber permanent pacemaker  2. Fluoroscopy  3. Light sedation  4. Left upper extremity venography  5. 2 view chest x-ray  Hospital Course: Stephen Robbins is a 78 y.o. male with no history of CAD. He had been evaluated for weakness and demonstrated chronotropic incompetence. He was not on any rate-lowering or rate-limiting medications. A stress test and echocardiogram showed no significant abnormalities. He was scheduled for permanent pacemaker and came to the hospital for the procedure on 10/13.  He had a Chemical engineer pacemaker inserted without complication. He tolerated the procedure well.  On 10/14, he was seen by Dr. Marlou Porch and all data were reviewed. His chest x-ray was without pneumothorax or other significant abnormality. His wound was without hematoma. There had been minimal drainage overnight but that has stopped. He had some soreness in the shoulder but there was no tenderness and no significant abnormality on chest x-ray.  His pacemaker was interrogated and was functioning well. No further inpatient workup was indicated and he is considered stable for discharge, to follow up as an outpatient.  Labs:   Lab Results  Component Value Date   WBC 6.3 12/29/2013   HGB 16.8 12/29/2013   HCT 46.8 12/29/2013   MCV 100.0 12/29/2013   PLT 158 12/29/2013     Recent Labs Lab 12/29/13 1627  NA 137  K 4.1  CL 102  CO2 25  BUN 16  CREATININE 0.77  CALCIUM 9.6  GLUCOSE 102*        Radiology: Dg Chest 2 View 01/05/2014   CLINICAL DATA:  Status post pacemaker placement complaining of soreness in the right arm.  EXAM: CHEST  2 VIEW  COMPARISON:  Chest x-ray 12/27/2011.  FINDINGS: New right-sided pacemaker with leads extending via right subclavian approach into the heart, with tips projecting over the expected location of the right atrium and right ventricular apex. No pneumothorax. No acute consolidative airspace disease. No pleural effusions. No evidence of pulmonary edema. Heart size and mediastinal contours are within normal limits. Skin staples are seen overlying the generator pocket.  IMPRESSION: 1. Status post right-sided pacemaker placement, as above, without acute complicating features. 2. No radiographic evidence of acute cardiopulmonary disease.   Electronically Signed   By: Vinnie Langton M.D.   On: 01/05/2014 08:05   ECG: 01/05/2014  A-pacing  Vent. rate 64 BPM  PR interval 192 ms  QRS duration 124 ms  QT/QTc 402/414 ms  P-R-T axes -3 -57 78   Procedure performed:  1. Implantation of new dual chamber permanent pacemaker  2. Fluoroscopy  3. Light sedation  4. Left upper extremity venography  Device details:  [employer] Essentio model [id number] serial number [id number]  Right atrial lead Guidant Dextrus 4098 serial number [id number]  Right ventricular lead Guidant Dextrus 4136 serial number [id number]  FOLLOW UP PLANS AND APPOINTMENTS Allergies  Allergen Reactions  . Penicillins Other (See Comments)    Unknown childhood reaction     Medication List         acetaminophen 325 MG tablet  Commonly known as:  TYLENOL  Take 1-2 tablets (325-650 mg total) by mouth every 4 (four) hours as needed for mild pain.     ALPRAZolam 0.25 MG tablet  Commonly known as:  XANAX  Take 0.25 mg by mouth 2 (two) times daily as needed.     aspirin 81 MG tablet  Take 81 mg by mouth daily.     B COMPLEX 50 Tabs  Take 1 tablet by mouth every morning.      benzonatate 100 MG capsule  Commonly known as:  TESSALON  Take 100 mg by mouth daily as needed for cough.     Canagliflozin 100 MG Tabs  Take 1 tablet by mouth every morning.     carbidopa-levodopa 25-100 MG per tablet  Commonly known as:  SINEMET IR  Take 1 tablet by mouth 3 (three) times daily before meals.     fish oil-omega-3 fatty acids 1000 MG capsule  Take 2 g by mouth daily.     folic acid 1 MG tablet  Commonly known as:  FOLVITE  Take 1 mg by mouth every morning.     GLUCOSAMINE CHONDR 500 COMPLEX Caps  Take 1 capsule by mouth daily.     hydrocortisone cream 0.5 %  Apply 1 application topically 2 (two) times daily as needed. rash     ibuprofen 200 MG tablet  Commonly known as:  ADVIL,MOTRIN  Take 200 mg by mouth daily as needed for mild pain.     levothyroxine 50 MCG tablet  Commonly known as:  SYNTHROID, LEVOTHROID  Take 1 tablet (50 mcg total) by mouth daily before breakfast.     MYRBETRIQ 25 MG Tb24 tablet  Generic drug:  mirabegron ER  Take 25 mg by mouth daily.     sertraline 25 MG tablet  Commonly known as:  ZOLOFT  Take 25 mg by mouth every morning.     simvastatin 20 MG tablet  Commonly known as:  ZOCOR  Take 20 mg by mouth every evening.     SYSTANE ULTRA 0.4-0.3 % Soln  Generic drug:  Polyethyl Glycol-Propyl Glycol  Place 1 drop into both eyes daily as needed (dry eyes).     tadalafil 5 MG tablet  Commonly known as:  CIALIS  Take 5 mg by mouth every morning.     tamsulosin 0.4 MG Caps capsule  Commonly known as:  FLOMAX  Take 0.4 mg by mouth every morning.     Testosterone 30 MG/ACT Soln  Apply 2 application topically daily. place 2 applicatorfuls ONTO SKIN once daily as directed     vitamin B-12 1000 MCG tablet  Commonly known as:  CYANOCOBALAMIN  Take 1,000 mcg by mouth every morning.        Discharge Instructions   Diet - low sodium heart healthy    Complete by:  As directed      Diet Carb Modified    Complete by:  As directed       Increase activity slowly    Complete by:  As directed           Follow-up Information   Follow up with Sanda Klein, MD. (The office will call with a wound check and followup appointment)    Specialty:  Cardiology   Contact information:   [address] [place] [address] [place] [address] [phone]       BRING ALL MEDICATIONS WITH YOU TO FOLLOW UP APPOINTMENTS  Time spent with patient to include physician time: 46 min Signed: Rosaria Ferries, PA-C  01/05/2014, 11:10 AM Co-Sign MD

## 2014-01-06 ENCOUNTER — Ambulatory Visit (INDEPENDENT_AMBULATORY_CARE_PROVIDER_SITE_OTHER): Payer: Medicare Other | Admitting: *Deleted

## 2014-01-06 ENCOUNTER — Telehealth: Payer: Self-pay | Admitting: *Deleted

## 2014-01-06 ENCOUNTER — Telehealth: Payer: Self-pay | Admitting: Cardiovascular Disease

## 2014-01-06 DIAGNOSIS — I4589 Other specified conduction disorders: Secondary | ICD-10-CM

## 2014-01-06 NOTE — Patient Instructions (Signed)
Your physician recommends that you schedule a follow-up appointment next week after Tuesday for staple removal and pacemaker site check  Your physician recommends that you schedule a follow-up appointment in: 30 days for pacemaker Check

## 2014-01-06 NOTE — Telephone Encounter (Signed)
Closed encounter °

## 2014-01-06 NOTE — Telephone Encounter (Signed)
Pt.came in for nurse visit /pacer wound check. Dressing removed from rt. upper chest area; there was a slight area of concern of the incision where the skin had an abrasion, Dr. Sallyanne Kuster in to see the pt. And instructions given to cover with light dressing and pt. Instructed by Dr. Sallyanne Kuster that he could shower but not to let water hit directly on the site, pt. Stated understanding of instructions. Appt to be made next week after Tuesday for staple removal and then back in 30 days for pacemaker check

## 2014-01-11 ENCOUNTER — Ambulatory Visit: Payer: Medicare Other | Admitting: Physician Assistant

## 2014-01-14 ENCOUNTER — Ambulatory Visit (INDEPENDENT_AMBULATORY_CARE_PROVIDER_SITE_OTHER): Payer: Medicare Other | Admitting: Cardiology

## 2014-01-14 ENCOUNTER — Encounter: Payer: Self-pay | Admitting: Cardiology

## 2014-01-14 VITALS — BP 112/58 | HR 71 | Ht 69.0 in | Wt 176.7 lb

## 2014-01-14 DIAGNOSIS — Z950 Presence of cardiac pacemaker: Secondary | ICD-10-CM

## 2014-01-14 NOTE — Patient Instructions (Addendum)
Keep your scheduled appointment with the [facility] 02/07/14 at the [facility]. Location.

## 2014-01-14 NOTE — Assessment & Plan Note (Signed)
Seen for site check

## 2014-01-14 NOTE — Progress Notes (Signed)
01/14/2014 Stephen Robbins   08/13/1935  [id number]  Primary Physicia Stephen Cowden, MD Primary Cardiologist: Dr Stephen Robbins  HPI:  78 y.o. male with no history of CAD. He had been evaluated for weakness and demonstrated chronotropic incompetence. He was not on any rate-lowering or rate-limiting medications. A stress test and echocardiogram showed no significant abnormalities. He was scheduled for permanent pacemaker and came to the hospital for the procedure on 10/13. He had a Chemical engineer pacemaker inserted without complication. He tolerated the procedure well.    Current Outpatient Prescriptions  Medication Sig Dispense Refill  . acetaminophen (TYLENOL) 325 MG tablet Take 1-2 tablets (325-650 mg total) by mouth every 4 (four) hours as needed for mild pain.      Marland Kitchen ALPRAZolam (XANAX) 0.25 MG tablet Take 0.25 mg by mouth 2 (two) times daily as needed.       Marland Kitchen aspirin 81 MG tablet Take 81 mg by mouth daily.      . B Complex Vitamins (B COMPLEX 50) TABS Take 1 tablet by mouth every morning.       . benzonatate (TESSALON) 100 MG capsule Take 100 mg by mouth daily as needed for cough.       . Canagliflozin 100 MG TABS Take 1 tablet by mouth every morning.      . carbidopa-levodopa (SINEMET IR) 25-100 MG per tablet Take 1 tablet by mouth 3 (three) times daily before meals.      . fish oil-omega-3 fatty acids 1000 MG capsule Take 2 g by mouth daily.      . folic acid (FOLVITE) 1 MG tablet Take 1 mg by mouth every morning.      . Glucosamine-Chondroit-Vit C-Mn (GLUCOSAMINE CHONDR 500 COMPLEX) CAPS Take 1 capsule by mouth daily.      . hydrocortisone 0.5 % cream Apply 1 application topically 2 (two) times daily as needed. rash      . ibuprofen (ADVIL,MOTRIN) 200 MG tablet Take 200 mg by mouth daily as needed for mild pain.       Marland Kitchen levothyroxine (SYNTHROID, LEVOTHROID) 50 MCG tablet Take 1 tablet (50 mcg total) by mouth daily before breakfast.  90 tablet  3  . mirabegron ER (MYRBETRIQ)  25 MG TB24 tablet Take 25 mg by mouth daily.      Stephen Robbins Glycol-Propyl Glycol (SYSTANE ULTRA) 0.4-0.3 % SOLN Place 1 drop into both eyes daily as needed (dry eyes).       . sertraline (ZOLOFT) 25 MG tablet Take 25 mg by mouth every morning.      . simvastatin (ZOCOR) 20 MG tablet Take 20 mg by mouth every evening.      . tadalafil (CIALIS) 5 MG tablet Take 5 mg by mouth every morning.       . tamsulosin (FLOMAX) 0.4 MG CAPS capsule Take 0.4 mg by mouth every morning.      . Testosterone 30 MG/ACT SOLN Apply 2 application topically daily. place 2 applicatorfuls ONTO SKIN once daily as directed      . vitamin B-12 (CYANOCOBALAMIN) 1000 MCG tablet Take 1,000 mcg by mouth every morning.        No current facility-administered medications for this visit.    Allergies  Allergen Reactions  . Penicillins Other (See Comments)    Unknown childhood reaction    History   Social History  . Marital Status: Married    Spouse Name: N/A    Number of Children: 0  . Years of Education:  N/A   Occupational History  . retired     Programme researcher, broadcasting/film/video    Social History Main Topics  . Smoking status: Former Smoker -- 1.00 packs/day for 10 years    Types: Cigarettes    Quit date: 03/25/1968  . Smokeless tobacco: Never Used  . Alcohol Use: 4.2 oz/week    7 Glasses of wine per week     Comment: 1 glass wine daily  . Drug Use: No  . Sexual Activity: Not Currently   Other Topics Concern  . Not on file   Social History Narrative   Regular exercise           Review of Systems: General: negative for chills, fever, night sweats or weight changes.  Cardiovascular: negative for chest pain, dyspnea on exertion, edema, orthopnea, palpitations, paroxysmal nocturnal dyspnea or shortness of breath Dermatological: negative for rash Respiratory: negative for cough or wheezing Urologic: negative for hematuria Abdominal: negative for nausea, vomiting, diarrhea, bright red blood per rectum, melena, or  hematemesis Neurologic: negative for visual changes, syncope, or dizziness All other systems reviewed and are otherwise negative except as noted above.    Blood pressure 112/58, pulse 71, height 5\' 9"  (1.753 m), weight 176 lb 11.2 oz (80.151 kg).  Pacer site without hematoma. There was some old ecchymosis in the epigastric area.  EKG- NSR, LAFB, overriding pacer.  ASSESSMENT AND PLAN:   Pacemaker - Stephen Robbins serial number [id number] Seen for site check    PLAN  Staples removed without problems. Some oozing from the lateral aspect, wound is intact, small dressing applied.   Stephen Robbins KPA-C 01/14/2014 1:54 PM

## 2014-01-24 DIAGNOSIS — H2513 Age-related nuclear cataract, bilateral: Secondary | ICD-10-CM | POA: Diagnosis not present

## 2014-01-24 LAB — HM DIABETES EYE EXAM

## 2014-01-25 NOTE — Progress Notes (Signed)
Pacer site check

## 2014-01-27 ENCOUNTER — Encounter: Payer: Self-pay | Admitting: Cardiovascular Disease

## 2014-02-01 ENCOUNTER — Telehealth: Payer: Self-pay | Admitting: Cardiovascular Disease

## 2014-02-01 NOTE — Telephone Encounter (Signed)
The pt is advised that at his pacer check that is scheduled on 11/16 here at the [facility] office on [facility]. we will check his wound and pacemaker as well as discuss his restrictions. He verbalized understanding and thanked me for my quick call back and assistance.

## 2014-02-01 NOTE — Telephone Encounter (Signed)
Pt had pacemaker put in 01-04-14. He wants to know what his restrictions are?If he is not there please leave him a message.

## 2014-02-07 ENCOUNTER — Ambulatory Visit (INDEPENDENT_AMBULATORY_CARE_PROVIDER_SITE_OTHER): Payer: Medicare Other | Admitting: *Deleted

## 2014-02-07 DIAGNOSIS — G909 Disorder of the autonomic nervous system, unspecified: Secondary | ICD-10-CM

## 2014-02-07 DIAGNOSIS — G908 Other disorders of autonomic nervous system: Secondary | ICD-10-CM

## 2014-02-07 DIAGNOSIS — I495 Sick sinus syndrome: Secondary | ICD-10-CM

## 2014-02-07 LAB — MDC_IDC_ENUM_SESS_TYPE_INCLINIC
Battery Remaining Longevity: 180 mo
Brady Statistic RA Percent Paced: 17 %
Brady Statistic RV Percent Paced: 1 %
Date Time Interrogation Session: 20151116050000
Implantable Pulse Generator Serial Number: 702951
Lead Channel Impedance Value: 571 Ohm
Lead Channel Impedance Value: 573 Ohm
Lead Channel Pacing Threshold Amplitude: 0.6 V
Lead Channel Pacing Threshold Amplitude: 2.4 V
Lead Channel Pacing Threshold Pulse Width: 0.4 ms
Lead Channel Pacing Threshold Pulse Width: 0.4 ms
Lead Channel Sensing Intrinsic Amplitude: 8.1 mV
Lead Channel Sensing Intrinsic Amplitude: 8.3 mV
Lead Channel Setting Pacing Amplitude: 2 V
Lead Channel Setting Pacing Amplitude: 2.9 V
Lead Channel Setting Pacing Pulse Width: 0.4 ms
Lead Channel Setting Sensing Sensitivity: 2.5 mV
Zone Setting Detection Interval: 353 ms

## 2014-02-07 NOTE — Progress Notes (Signed)
Wound check appointment. Steri-strips removed by patient.   Wound without redness or edema. Incision edges approximated, wound well healed. Normal device function. R-waves, impedance and threshold changed since implant suggestive of lead dislodgement.  Patient to have CXR done.   Device programmed at 3.5V/auto capture programmed on for extra safety margin until 3 month visit. Histogram distribution appropriate for patient and level of activity. No mode switches or high ventricular rates noted. Patient educated about wound care, arm mobility, lifting restrictions. ROV in 3 months with implanting physician.

## 2014-02-07 NOTE — Addendum Note (Signed)
Addended by: Carlus Pavlov on: 02/07/2014 04:44 PM   Modules accepted: Orders

## 2014-02-08 ENCOUNTER — Encounter: Payer: Medicare Other | Admitting: Cardiovascular Disease

## 2014-02-08 ENCOUNTER — Telehealth: Payer: Self-pay | Admitting: Cardiovascular Disease

## 2014-02-08 ENCOUNTER — Encounter: Payer: Self-pay | Admitting: Cardiovascular Disease

## 2014-02-08 ENCOUNTER — Encounter: Payer: Self-pay | Admitting: Internal Medicine

## 2014-02-08 ENCOUNTER — Ambulatory Visit (HOSPITAL_COMMUNITY)
Admission: RE | Admit: 2014-02-08 | Discharge: 2014-02-08 | Disposition: A | Discharge status: Home / Self Care | Payer: Medicare Other | Source: Ambulatory Visit | Attending: Cardiovascular Disease | Admitting: Cardiovascular Disease

## 2014-02-08 DIAGNOSIS — Z45018 Encounter for adjustment and management of other part of cardiac pacemaker: Secondary | ICD-10-CM | POA: Diagnosis not present

## 2014-02-08 DIAGNOSIS — I495 Sick sinus syndrome: Secondary | ICD-10-CM

## 2014-02-08 DIAGNOSIS — Z950 Presence of cardiac pacemaker: Secondary | ICD-10-CM | POA: Diagnosis not present

## 2014-02-08 DIAGNOSIS — G909 Disorder of the autonomic nervous system, unspecified: Secondary | ICD-10-CM

## 2014-02-08 DIAGNOSIS — G908 Other disorders of autonomic nervous system: Secondary | ICD-10-CM

## 2014-02-08 NOTE — Telephone Encounter (Signed)
Please call,wants to know if his xray results are back from this morning?Please call asap,very anxious to get the results.

## 2014-02-08 NOTE — Telephone Encounter (Signed)
Returned call. Pt not available. Wife given message that results are not available yet.

## 2014-02-09 ENCOUNTER — Telehealth: Payer: Self-pay | Admitting: *Deleted

## 2014-02-09 ENCOUNTER — Encounter: Payer: Self-pay | Admitting: Cardiovascular Disease

## 2014-02-09 NOTE — Telephone Encounter (Signed)
[name] called patient with results.

## 2014-02-09 NOTE — Telephone Encounter (Signed)
Spoke to pt this am regarding the cxr he had done yesterday to F/U on RV lead. I informed him that per Dr.Croitoru lead was still in a good position and revision was not neccessary. Pt also inquired about his limitations as far as exercising. I informed pt that he can go back to his exercises without restriction on 12-9 since his implant was 10-13. I confirmed with Dr.Croitoru that this date would be ok. Patient voiced understanding to info given. An appt was made to F/U with [facility] on 1-12. Patient encouraged to call with any further questions.

## 2014-02-11 ENCOUNTER — Telehealth: Payer: Self-pay | Admitting: *Deleted

## 2014-02-11 MED ORDER — CANAGLIFLOZIN 100 MG PO TABS: 100.0000 mg | ORAL_TABLET | Freq: Every day | ORAL | Status: DC

## 2014-02-11 NOTE — Telephone Encounter (Signed)
Spoke to pt yesterday regarding his Corporate investment banker. I informed him that it was working properly. He voiced understanding and asked once more about his restrictions. I reiterated to him that he will be able to return to full activity on 12-9. Patient voiced understanding and appreciation for info given. I encouraged him to call/message me if he has any further issues.

## 2014-02-11 NOTE — Telephone Encounter (Signed)
Spoke to pt, asked him if he wants 90 day supply?  Pt stated yes. Told him okay Rx for Invokana  sent to pharmacy. Pt verbalized understanding.

## 2014-02-15 ENCOUNTER — Other Ambulatory Visit: Payer: Self-pay | Admitting: Internal Medicine

## 2014-02-15 ENCOUNTER — Encounter: Payer: Self-pay | Admitting: Cardiovascular Disease

## 2014-02-15 MED ORDER — FOLIC ACID 1 MG PO TABS: 1.0000 mg | ORAL_TABLET | Freq: Every morning | ORAL | Status: DC

## 2014-02-15 NOTE — Telephone Encounter (Signed)
Rx called sent to pharmacy.

## 2014-02-15 NOTE — Telephone Encounter (Signed)
[facility] [place] [phone] is requesting re-fill on folic acid (FOLVITE) 1 MG tablet

## 2014-02-21 ENCOUNTER — Ambulatory Visit (INDEPENDENT_AMBULATORY_CARE_PROVIDER_SITE_OTHER): Payer: Medicare Other | Admitting: Neurology

## 2014-02-21 ENCOUNTER — Encounter: Payer: Self-pay | Admitting: Internal Medicine

## 2014-02-21 ENCOUNTER — Encounter: Payer: Self-pay | Admitting: Neurology

## 2014-02-21 VITALS — BP 130/62 | HR 60 | Ht 69.0 in | Wt 175.0 lb

## 2014-02-21 DIAGNOSIS — I6529 Occlusion and stenosis of unspecified carotid artery: Secondary | ICD-10-CM

## 2014-02-21 DIAGNOSIS — G629 Polyneuropathy, unspecified: Secondary | ICD-10-CM

## 2014-02-21 DIAGNOSIS — E1342 Other specified diabetes mellitus with diabetic polyneuropathy: Secondary | ICD-10-CM

## 2014-02-21 DIAGNOSIS — G20 Parkinson's disease: Secondary | ICD-10-CM

## 2014-02-21 DIAGNOSIS — E1142 Type 2 diabetes mellitus with diabetic polyneuropathy: Secondary | ICD-10-CM

## 2014-02-21 NOTE — Patient Instructions (Signed)
I do not recommend that we change medication for your parkinsons disease.  You look well.  Return to cardiovascular exercise when you are cleared by your cardiologist.  As I mentioned today, you do have peripheral neuropathy that is likely from DM.   We have looked for other causes previously and that workup for reversible causes was negative.  I do not think that the levodopa is the primary contributor (or even a contributor at all) to peripheral neuropathy.

## 2014-02-21 NOTE — Progress Notes (Signed)
Stephen Robbins was seen today in f/u in the movement clinic.  He was dx with PD last visit, on 06/17/13.  He is on carbidopa/levodopa 25/100 tid.  He has no side effects with the carbidopa/levodopa, but states that "I just don't know what to expect."  He continues to have tremor, which can be bothersome for him.  He has had no falls.  No lightheadedness.  No near syncope.  No hallucinations.  He had a carotid ultrasound done since last visit.  There is 1-39% stenosis bilaterally.  He did have some lab work done since last visit.  His hemoglobin A1c has greatly improved.  It is 6.3 (was 7.9 and prior to that was 9.4). He just finished PT and is enrolled in the exercise class now.  He presents today with a long list of questions.  He again tape records our session.    11/19/13 update:  Pt is f/u regarding Parkinson's disease.  He is currently on carbidopa/levodopa 25/100, one tablet 3 times per day. He is accompanied by his wife, who supplements the history.   Patient denies any falls.  He denies hallucinations.  Denies nausea or vomiting.  No lightheadedness or near syncope.  He is exercising.  He still has tremor and isn't sure that the levodopa helps tremor.  He sent me several e-mails since last visit.  One of these was just a TV news link regarding [facility] high-resolution imaging and deep brain stimulation.  He subsequently sent another e-mail regarding focused ultrasound.  Asks about updates regarding these and has 2 pages of questions.  Asks about why we can't give him human growth hormone or steroids for PD.  Overall, patient is clinically doing well.   02/21/14 update:   Pt returns for f/u, accompanied by his wife who supplements the history.  The records that were made available to me were reviewed.  Pt has had PPM since last visit due to bradycardia.  He has been told not to exercise until 12/9.  He will start with PWR moves classes after the first of the year as well as circuit 2 classes.  He is still  on carbidopa/levodopa 25/100 at 8-9am/12-1pm/6-7 pm.  He is c/o paresthesias/"neuropathy" in the R foot.  He only notices it with driving.   He has no back pain.  He has no sensation of balance loss with closing eyes in the shower.  He thinks that exposure to levodopa causes the neuropathy and brings with him a small study (58 pts) that suggests that levodopa could be an etiology for the neuropathy.  Notices lack of smell.  No falls.  Rare lightheadedness. No hallucinations.    No neuroimaging since CT brain in 2009.  PREVIOUS MEDICATIONS: none to date  ALLERGIES:   Allergies  Allergen Reactions  . Penicillins Other (See Comments)    Unknown childhood reaction    CURRENT MEDICATIONS:  Current Outpatient Prescriptions on File Prior to Visit  Medication Sig Dispense Refill  . acetaminophen (TYLENOL) 325 MG tablet Take 1-2 tablets (325-650 mg total) by mouth every 4 (four) hours as needed for mild pain.    Marland Kitchen ALPRAZolam (XANAX) 0.25 MG tablet Take 0.25 mg by mouth 2 (two) times daily as needed.     Marland Kitchen aspirin 81 MG tablet Take 81 mg by mouth daily.    . B Complex Vitamins (B COMPLEX 50) TABS Take 1 tablet by mouth every morning.     . canagliflozin (INVOKANA) 100 MG TABS tablet  Take 1 tablet (100 mg total) by mouth daily. 90 tablet 3  . carbidopa-levodopa (SINEMET IR) 25-100 MG per tablet Take 1 tablet by mouth 3 (three) times daily before meals.    . fish oil-omega-3 fatty acids 1000 MG capsule Take 2 g by mouth daily.    . folic acid (FOLVITE) 1 MG tablet Take 1 tablet (1 mg total) by mouth every morning. 90 tablet 1  . Glucosamine-Chondroit-Vit C-Mn (GLUCOSAMINE CHONDR 500 COMPLEX) CAPS Take 1 capsule by mouth daily.    . hydrocortisone 0.5 % cream Apply 1 application topically 2 (two) times daily as needed. rash    . ibuprofen (ADVIL,MOTRIN) 200 MG tablet Take 200 mg by mouth daily as needed for mild pain.     Marland Kitchen levothyroxine (SYNTHROID, LEVOTHROID) 50 MCG tablet Take 1 tablet (50 mcg  total) by mouth daily before breakfast. 90 tablet 3  . mirabegron ER (MYRBETRIQ) 25 MG TB24 tablet Take 25 mg by mouth daily.    Vladimir Faster Glycol-Propyl Glycol (SYSTANE ULTRA) 0.4-0.3 % SOLN Place 1 drop into both eyes daily as needed (dry eyes).     . sertraline (ZOLOFT) 25 MG tablet Take 25 mg by mouth every morning.    . simvastatin (ZOCOR) 20 MG tablet Take 20 mg by mouth every evening.    . tadalafil (CIALIS) 5 MG tablet Take 5 mg by mouth every morning.     . tamsulosin (FLOMAX) 0.4 MG CAPS capsule Take 0.4 mg by mouth every morning.    . Testosterone 30 MG/ACT SOLN Apply 2 application topically daily. place 2 applicatorfuls ONTO SKIN once daily as directed    . vitamin B-12 (CYANOCOBALAMIN) 1000 MCG tablet Take 1,000 mcg by mouth every morning.      No current facility-administered medications on file prior to visit.    PAST MEDICAL HISTORY:   Past Medical History  Diagnosis Date  . Hyperlipidemia   . Hemorrhoids   . Hypothyroidism   . Asymptomatic bilateral carotid artery stenosis     per duplex  05-15-2012  left >39%/   right 40-59%  . Bradycardia   . Fatigue   . Dizzy   . BPH (benign prostatic hypertrophy) with urinary obstruction   . NSVT (nonsustained ventricular tachycardia)     cardiologist-  dr croitoru  . Chronotropic incompetence with sinus node dysfunction   . Borderline diabetes   . GERD (gastroesophageal reflux disease)     occasional  . Compression of lumbar vertebra     L4 -- L5  . Compressed cervical disc   . Frequency of urination   . Urgency of urination   . Nocturia   . Wears glasses   . Pacemaker   . Bronchial pneumonia 1958  . Dysmetabolic syndrome   . Arthritis     "joints; a little" (01/04/2014)  . Basal cell carcinoma     nose    PAST SURGICAL HISTORY:   Past Surgical History  Procedure Laterality Date  . Neuroplasty / transposition ulnar nerve at elbow Left 02-09-2010  . Closed reduction nasal fracture  09-01-2007  . Exercise  tolerence test  12-03-2012  DR CROITURO    CHRONOTROPIC INCOMPETENCE/ NORMAL RESTING BP W/ APPROPRIATE RESPONSE/ NO CHEST PAIN/ NO ST CHANGES FROM BASELINE  . Laceration repair Right 1978    middle finger  . Tonsillectomy and adenoidectomy  1944  . Cystoscopy N/A 12/28/2012    Procedure: CYSTOSCOPY;  Surgeon: Bernestine Amass, MD;  Location: [facility];  Service:  Urology;  Laterality: N/A;  . Transurethral incision of prostate N/A 12/28/2012    Procedure: TRANSURETHRAL INCISION OF THE PROSTATE (TUIP);  Surgeon: Bernestine Amass, MD;  Location: [facility];  Service: Urology;  Laterality: N/A;  . Ulnar nerve transposition    . Insert / replace / remove pacemaker  01/04/2014    [employer] model L121 serial number [id number]  . Basal cell carcinoma excision      nose    SOCIAL HISTORY:   History   Social History  . Marital Status: Married    Spouse Name: N/A    Number of Children: 0  . Years of Education: N/A   Occupational History  . retired     Programme researcher, broadcasting/film/video    Social History Main Topics  . Smoking status: Former Smoker -- 1.00 packs/day for 10 years    Types: Cigarettes    Quit date: 03/25/1968  . Smokeless tobacco: Never Used  . Alcohol Use: 4.2 oz/week    7 Glasses of wine per week     Comment: 1 glass wine daily  . Drug Use: No  . Sexual Activity: Not Currently   Other Topics Concern  . Not on file   Social History Narrative   Regular exercise          FAMILY HISTORY:   Family Status  Relation Status Death Age  . Father Deceased     stroke  . Mother Deceased     lung cancer  . Brother Alive     healthy    ROS:    A complete 10 system review of systems was obtained and was unremarkable apart from what is mentioned above.  PHYSICAL EXAMINATION:    VITALS:   Filed Vitals:   02/21/14 1442  BP: 130/62  Pulse: 60  Height: 5\' 9"  (1.753 m)  Weight: 175 lb (79.379 kg)    GEN:  The patient appears stated age and is in  NAD. HEENT:  [place]/AT.  MMM CV:  RRR Lungs:  CTAB Neck:  No bruits   Neurological examination:  Orientation: The patient is alert and oriented x3. Fund of knowledge is appropriate.  Recent and remote memory are intact.  Cranial nerves: There is good facial symmetry. Mild facial hypomimia.  Pupils are equal round and reactive to light bilaterally. Fundoscopic exam reveals clear margins bilaterally. Extraocular muscles are intact. The visual fields are full to confrontational testing. The speech is fluent and clear.  Soft palate rises symmetrically and there is no tongue deviation. Hearing is intact to conversational tone. Motor: Strength is 5/5 in the bilateral upper and lower extremities.   Shoulder shrug is equal and symmetric.  There is no pronator drift. Sensation:  There is decreased vibration distally.    Movement examination: Tone: There is slight increased tone bilaterally, most noticeable with activation procedures. Abnormal movements: There is a rare resting tremor on the L Coordination:  There are normal RAM's in the UE/LE today. Gait and Station: The patient has no difficulty arising out of a deep-seated chair without the use of the hands. The patient's stride length is fairly normal with good arm swing.  The patient has a negative pull test.      LABS:  Lab Results  Component Value Date   TSH 2.16 08/31/2012   Lab Results  Component Value Date   VITAMINB12 872 06/17/2013     Chemistry      Component Value Date/Time   NA 137 12/29/2013 1627  K 4.1 12/29/2013 1627   CL 102 12/29/2013 1627   CO2 25 12/29/2013 1627   BUN 16 12/29/2013 1627   CREATININE 0.77 12/29/2013 1627   CREATININE 0.6 08/09/2013 1313      Component Value Date/Time   CALCIUM 9.6 12/29/2013 1627   ALKPHOS 34* 08/09/2013 1313   AST 32 08/09/2013 1313   ALT 27 08/09/2013 1313   BILITOT 1.3* 08/09/2013 1313     RPR negative Lab Results  Component Value Date   FOLATE >20.0 06/17/2013      Lab Results  Component Value Date   HGBA1C 6.6* 12/14/2013    ASSESSMENT/PLAN:  1. idiopathic Parkinson's disease.  I see no atypical features.    -We discussed the diagnosis as well as pathophysiology of the disease.  We discussed treatment options as well as prognostic indicators.  Patient education was provided.  -Greater than 50% of the 30 minute visit was again spent in counseling answering questions and talking about what to expect now as well as in the future.  We talked about medication options as well as potential future surgical options.   We talked about safety in the home.  We talked extensively about exercise and its benefits.  We talked about various types of exercise.  We talked about programs in the community.  He is already enrolled in the Parkinson's exercise program here.  -He will continue the carbidopa/levodopa 25/100, one tablet 3 times daily.  -Explained on prior visit to patient that there is no science behind human growth hormone or steroids for PD.  Explained the long term SE of steroids and I could think of no beneficial reason for a PD patient to be on these. 2.  Peripheral neuropathy  -There is evidence of this on examination.  Likely from DM.  His workup for reversible causes was negative.  His diabetes is under much better control than in the past.  -explained to the patient that I don't think that the levodopa is the primary contributor (or even a contributor at all) to peripheral neuropathy as he read a small study about this.   3.  Hx of transaminasemia  -These were elevated in 2012 but have improved 4.  R carotid bruit with hx of carotid stenosis  -04/2012 u/s with 40-50% stenosis on the R.  this was rechecked in 2015 and was 1-39% bilaterally. 5.  Return in about 3 months (around 05/23/2014).

## 2014-02-22 ENCOUNTER — Other Ambulatory Visit: Payer: Self-pay

## 2014-02-22 MED ORDER — SIMVASTATIN 20 MG PO TABS: 20.0000 mg | ORAL_TABLET | Freq: Every evening | ORAL | Status: DC

## 2014-02-22 NOTE — Telephone Encounter (Signed)
Rx request from [facility] for simvastatin.  Rx sent to pharmacy for 6 month supply.

## 2014-02-23 ENCOUNTER — Encounter: Payer: Self-pay | Admitting: Internal Medicine

## 2014-02-23 ENCOUNTER — Encounter: Payer: Self-pay | Admitting: Neurology

## 2014-02-25 NOTE — Telephone Encounter (Signed)
Dr. Carles Collet responded to pt's request.

## 2014-02-28 ENCOUNTER — Encounter: Payer: Self-pay | Admitting: Internal Medicine

## 2014-02-28 ENCOUNTER — Ambulatory Visit (INDEPENDENT_AMBULATORY_CARE_PROVIDER_SITE_OTHER): Payer: Medicare Other | Admitting: Internal Medicine

## 2014-02-28 VITALS — BP 122/66 | HR 63 | Ht 69.0 in | Wt 178.0 lb

## 2014-02-28 DIAGNOSIS — I6529 Occlusion and stenosis of unspecified carotid artery: Secondary | ICD-10-CM | POA: Diagnosis not present

## 2014-02-28 DIAGNOSIS — R059 Cough, unspecified: Secondary | ICD-10-CM

## 2014-02-28 DIAGNOSIS — R05 Cough: Secondary | ICD-10-CM

## 2014-02-28 NOTE — Patient Instructions (Signed)
I am glad you seem to be doing so well. Please call if we can help.

## 2014-02-28 NOTE — Progress Notes (Signed)
12/27/11- 24 yoM former smoker referred courtesy of Dr Arnoldo Morale, concerned about weak voice and cough. He had come here when his wife was a new patient recently. He had smoked in [employer] but quit in 1970. He complains that for several years his voice will get weak easily while talking. Occasional cough has been relieved with lemon drops. He and his wife came back from a visit to [place] with a cold and coughing. Otherwise cough is occasional. Using saline solution by squeeze bottle for nasal rinse. Aware of postnasal drip but never noticed seasonal rhinitis. He had fractured his nose and had a septoplasty in 2008. He denies choking or strangling with meal or drink, choking during sleep or any history of lung disease or asthma. Triggers for cough have included mildew. He has had plastic sheath and put down in his crawl space but he is not actually know that there is a mold problem in his home. His parents smoked when mother died of lung cancer.  February 26, 2012- 42 yoM former smoker referred courtesy of Dr Arnoldo Morale, concerned about weak voice and cough. FOLLOWS WUX:LKGMW having tickle in throat area. No longer having a cough like before; Refill Qnasl             wife here Cough is much better. Still complains of "weak voice". Has had flu vaccine. Swallowing eval on October 29 showed some hesitation. Has prescription from Dr. Lucia Gaskins for Nexium and I discussed use. Suggested hard candies as a throat lozenge if needed. CXR 01/01/12-reviewed IMPRESSION:  Minimal central peribronchial thickening is present. This may be  associated with bronchitis, asthma, and reactive airway disease.  No peripheral infiltrate consolidation is evident. Chronic  findings are detailed above.  Original Report Authenticated By: Delane Ginger, M.D.   02/28/14- 18 yoM former smoker referred courtesy of Dr Arnoldo Morale, concerned about weak voice and cough.  FOLLOWS FOR: last seen 01/2012.  Has been dx'ed with Parkinson's since last  visit.  pt c/o hoarseness, no other complaints.  Not much cough and little shortness of breath. Going to gym 4-6 times a week for exercise. Now has pacemaker right chest because of sick sinus. Chest x-ray 02/08/2014 after pacemaker insertion was okay.  ROS-see HPI Constitutional:   No-   weight loss, night sweats, fevers, chills, fatigue, lassitude. HEENT:   No-  headaches, difficulty swallowing, tooth/dental problems, sore throat,       No-  sneezing, itching, ear ache, nasal congestion, post nasal drip,  CV:  No-   chest pain, orthopnea, PND, swelling in lower extremities, anasarca, dizziness, palpitations Resp: No-   shortness of breath with exertion or at rest.              No-   productive cough,  + non-productive cough,  No- coughing up of blood.              No-   change in color of mucus.  No- wheezing.   Skin: No-   rash or lesions. GI:  No-   heartburn, indigestion, abdominal pain, nausea, vomiting,  GU: . MS:  No-   joint pain or swelling.  . Neuro-     nothing unusual Psych:  No- change in mood or affect. No depression or anxiety.  No memory loss.  OBJ- Physical Exam General- Alert, Oriented, Affect-appropriate, Distress- none acute Skin- rash-none, lesions- none, excoriation- none Lymphadenopathy- none Head- atraumatic            Eyes- Gross vision intact,  PERRLA, conjunctivae and secretions clear            Ears- Hearing, canals-normal            Nose- Clear, + deviated external nose and +Septal dev, mucus, polyps, erosion, perforation             Throat- Mallampati II , mucosa clear , drainage- none, tonsils- atrophic. Voice quality is normal. Neck- flexible , trachea midline, no stridor , thyroid nl, carotid no bruit Chest - symmetrical excursion , unlabored           Heart/CV- RRR , no murmur , no gallop  , no rub, nl s1 s2                           - JVD- none , edema- none, stasis changes- none, varices- none           Lung-  wheeze- none, cough-none ,  dullness-none, rub- none           Chest wall-  Abd-  Br/ Gen/ Rectal- Not done, not indicated Extrem- cyanosis- none, clubbing, none, atrophy- none, strength- nl Neuro- grossly intact to observation

## 2014-03-01 DIAGNOSIS — N401 Enlarged prostate with lower urinary tract symptoms: Secondary | ICD-10-CM | POA: Diagnosis not present

## 2014-03-01 DIAGNOSIS — E291 Testicular hypofunction: Secondary | ICD-10-CM | POA: Diagnosis not present

## 2014-03-03 ENCOUNTER — Encounter (HOSPITAL_COMMUNITY): Payer: Self-pay | Admitting: Cardiovascular Disease

## 2014-03-13 NOTE — Assessment & Plan Note (Signed)
Cough is improved. Not short of breath. Exercising regularly. His complaint of weak voice might be related to his Parkinson's.

## 2014-03-15 ENCOUNTER — Ambulatory Visit: Payer: Medicare Other | Attending: Neurology | Admitting: Physical Therapy

## 2014-03-15 ENCOUNTER — Ambulatory Visit: Payer: Medicare Other | Admitting: Physical Therapy

## 2014-03-15 DIAGNOSIS — G20 Parkinson's disease: Secondary | ICD-10-CM

## 2014-03-15 DIAGNOSIS — R269 Unspecified abnormalities of gait and mobility: Secondary | ICD-10-CM

## 2014-03-15 NOTE — Therapy (Signed)
[facility] [address] [place], [name], [address] Phone: [phone]   Fax:  [phone]  Patient Details  Name: Stephen Robbins MRN: [id number] Date of Birth: [birth year]  Encounter Date: 03/15/2014 Physical Therapy Parkinson's Disease Screen   Timed Up and Go test:11.02 sec  10 meter walk test:3.03 ft/sec  5 time sit to stand test:9.23 sec   Patient does not require Physical Therapy services at this time.  Recommend Physical Therapy screen in 6-9 months.  Pt desires to rejoin [employer] Exercise classes, as he has been cleared from pacemaker procedures.   [name], PT 03/15/2014 1:22 PM Phone: [phone] Fax: [phone]   [name]. 03/15/2014, 1:22 PM    [facility] [address] [place], [name], [address] Phone: [phone]   Fax:  [phone]

## 2014-03-16 ENCOUNTER — Other Ambulatory Visit: Payer: Self-pay | Admitting: *Deleted

## 2014-03-16 MED ORDER — SIMVASTATIN 20 MG PO TABS: 20.0000 mg | ORAL_TABLET | Freq: Every evening | ORAL | Status: DC

## 2014-03-21 ENCOUNTER — Other Ambulatory Visit: Payer: Self-pay | Admitting: *Deleted

## 2014-03-21 MED ORDER — TAMSULOSIN HCL 0.4 MG PO CAPS: 0.4000 mg | ORAL_CAPSULE | Freq: Every morning | ORAL | Status: DC

## 2014-03-22 ENCOUNTER — Ambulatory Visit (INDEPENDENT_AMBULATORY_CARE_PROVIDER_SITE_OTHER): Payer: Medicare Other

## 2014-03-22 VITALS — BP 99/63 | HR 72 | Resp 12

## 2014-03-22 DIAGNOSIS — G629 Polyneuropathy, unspecified: Secondary | ICD-10-CM | POA: Diagnosis not present

## 2014-03-22 DIAGNOSIS — I6529 Occlusion and stenosis of unspecified carotid artery: Secondary | ICD-10-CM | POA: Diagnosis not present

## 2014-03-22 DIAGNOSIS — R52 Pain, unspecified: Secondary | ICD-10-CM

## 2014-03-22 DIAGNOSIS — G20 Parkinson's disease: Secondary | ICD-10-CM

## 2014-03-22 DIAGNOSIS — E1140 Type 2 diabetes mellitus with diabetic neuropathy, unspecified: Secondary | ICD-10-CM

## 2014-03-22 NOTE — Progress Notes (Signed)
   Subjective:    Patient ID: Stephen Robbins, male    DOB: [birth year], 78 y.o.   MRN: [id number]  HPI  PT STATED B/L LATERAL SIDE OF THE FOOT HAVING NUMBNESS AND TINGLING SENSATION FOR 2 MONTHS. FEET ARE THE SAME AND IS NOT GETTING WORSE. THE FOOT GET AGGRAVATED BY SITTING/ STANDING. TRIED NO TREATMENT.  Review of Systems  Genitourinary: Positive for frequency.  All other systems reviewed and are negative.      Objective:   Physical Exam 78 year old white male well-developed well-nourished oriented 3 does have history of multiple issues including Parkinson's history of lumbar radiculopathy patient does have a metabolic syndrome prediabetic status with possible diabetic neuropathy as well describes numbness tingling burning abnormal sensation lateral aspects of both feet however on exam patient has reduced vibratory sensation to the lateral forefoot has reduced sensation on Semmes Weinstein to the forefoot digits plantar arch and plantar heel bilateral. Intact sensation above the ankle noted neurovascular status otherwise unremarkable unchanged patient is a long list of medications taking medications for her from neuropathy as well as about multiple vitamin supplements and Flomax previously on testosterone and also is taking begin multiple B vitamin complex. Pedal pulses are palpable epicritic sensations diminished on Semmes Weinstein to the forefoot digits and arch and dorsum of the foot plantar response is normal DTRs not listed dermatologic the skin color pigment normal hair growth absent mild varicosities noted skin temperature warm to cool turgor diminished       Assessment & Plan:  Assessment neuritis and peripheral neuropathy associated with diabetic and pre-metabolic state also cannot rule out associated with Parkinson's and possible chemical exposures plan at this time patient is referred back to his neurologist for reevaluation and continued monitoring the patient is RE on appropriate  regimen of medications including and nerve sedative is antidepressants and vitamin supplements discharge to an as-needed basis for future follow-up patient is advised that neuropathy is not something that we can heel is likely progressive although the current management he's getting will slow down the progression indefinitely. Follow-up as needed  Harriet Masson DPM

## 2014-03-22 NOTE — Patient Instructions (Signed)

## 2014-04-01 ENCOUNTER — Other Ambulatory Visit: Payer: Self-pay | Admitting: Physician Assistant

## 2014-04-01 ENCOUNTER — Telehealth: Payer: Self-pay | Admitting: Internal Medicine

## 2014-04-01 ENCOUNTER — Telehealth: Payer: Self-pay

## 2014-04-01 NOTE — Telephone Encounter (Signed)
Please see message and advise 

## 2014-04-01 NOTE — Telephone Encounter (Signed)
Left message on voicemail to call office.  

## 2014-04-01 NOTE — Telephone Encounter (Signed)
Jardiance 25 mg  #90 one daily

## 2014-04-01 NOTE — Telephone Encounter (Signed)
I called [facility] to submit a PA for Invokana and was advised patient must try and fail [place] and Jardiance first.

## 2014-04-04 ENCOUNTER — Telehealth: Payer: Self-pay | Admitting: Internal Medicine

## 2014-04-04 MED ORDER — EMPAGLIFLOZIN 25 MG PO TABS: 25.0000 mg | ORAL_TABLET | Freq: Every day | ORAL | Status: DC

## 2014-04-04 MED ORDER — LEVOTHYROXINE SODIUM 50 MCG PO TABS: 50.0000 ug | ORAL_TABLET | Freq: Every day | ORAL | Status: DC

## 2014-04-04 NOTE — Telephone Encounter (Signed)
Spoke to pt, told him I sent Rx to [facility] earlier does he want me to send to [facility] also. Pt stated yes. Told him okay will send to [facility]. Pt verbalized understanding. Rx sent

## 2014-04-04 NOTE — Telephone Encounter (Signed)
refill 

## 2014-04-04 NOTE — Telephone Encounter (Signed)
Pt called back told him need to change Invokana to Jardiance 25 mg one tablet daily due to insurance will not cover Invokana. Rx sent to pharmacy. Pt verbalized understanding.

## 2014-04-04 NOTE — Telephone Encounter (Signed)
Pt would like Jardiance 25mg  from [facility] mail order. Fax is [phone] and the Phone is [phone]. He wants you to know that he only has 3 Invokana left. He uses [facility] on [address] locally.

## 2014-04-05 ENCOUNTER — Encounter: Payer: Self-pay | Admitting: Cardiovascular Disease

## 2014-04-05 ENCOUNTER — Ambulatory Visit (INDEPENDENT_AMBULATORY_CARE_PROVIDER_SITE_OTHER): Payer: Medicare Other | Admitting: Cardiovascular Disease

## 2014-04-05 VITALS — BP 112/60 | HR 68 | Resp 16 | Ht 69.0 in | Wt 173.1 lb

## 2014-04-05 DIAGNOSIS — R001 Bradycardia, unspecified: Secondary | ICD-10-CM

## 2014-04-05 DIAGNOSIS — I472 Ventricular tachycardia: Secondary | ICD-10-CM | POA: Diagnosis not present

## 2014-04-05 DIAGNOSIS — I498 Other specified cardiac arrhythmias: Secondary | ICD-10-CM

## 2014-04-05 DIAGNOSIS — G909 Disorder of the autonomic nervous system, unspecified: Secondary | ICD-10-CM | POA: Diagnosis not present

## 2014-04-05 DIAGNOSIS — I495 Sick sinus syndrome: Secondary | ICD-10-CM | POA: Diagnosis not present

## 2014-04-05 DIAGNOSIS — G9089 Other disorders of autonomic nervous system: Secondary | ICD-10-CM

## 2014-04-05 DIAGNOSIS — G908 Other disorders of autonomic nervous system: Secondary | ICD-10-CM

## 2014-04-05 DIAGNOSIS — I4729 Other ventricular tachycardia: Secondary | ICD-10-CM

## 2014-04-05 NOTE — Patient Instructions (Signed)
Dr. Sallyanne Kuster recommends that you schedule a follow-up appointment in: [place].

## 2014-04-06 ENCOUNTER — Telehealth: Payer: Self-pay | Admitting: Internal Medicine

## 2014-04-06 ENCOUNTER — Encounter: Payer: Self-pay | Admitting: Cardiovascular Disease

## 2014-04-06 NOTE — Progress Notes (Signed)
Patient ID: Stephen Robbins, male   DOB: [birth year], 79 y.o.   MRN: [id number]      Reason for office visit Pacemaker follow-up, symptomatic sinus bradycardia, chronotropic incompetence  Stephen Robbins pacemaker site has healed nicely, but he has not yet noticed any improvement in energy or stamina. This is not surprising since interrogation of his device shows that he still has a blunted heart rate histograms and virtually never reaches a heart rate of 100 bpm even when exercising. At this point minute ventilation sensor was set on "out of box" settings and the accelerometer was in passive mode.  Otherwise all the pacemaker parameters are in normal range.  Allergies  Allergen Reactions  . Penicillins Other (See Comments)    Unknown childhood reaction    Current Outpatient Prescriptions  Medication Sig Dispense Refill  . acetaminophen (TYLENOL) 325 MG tablet Take 1-2 tablets (325-650 mg total) by mouth every 4 (four) hours as needed for mild pain.    Marland Kitchen ALPRAZolam (XANAX) 0.25 MG tablet Take 0.25 mg by mouth 2 (two) times daily as needed.     Marland Kitchen aspirin 81 MG tablet Take 81 mg by mouth daily.    . B Complex Vitamins (B COMPLEX 50) TABS Take 1 tablet by mouth every morning.     . carbidopa-levodopa (SINEMET IR) 25-100 MG per tablet Take 1 tablet by mouth 3 (three) times daily before meals.    . empagliflozin (JARDIANCE) 25 MG TABS tablet Take 25 mg by mouth daily. 90 tablet 1  . fish oil-omega-3 fatty acids 1000 MG capsule Take 2 g by mouth daily.    . folic acid (FOLVITE) 1 MG tablet Take 1 tablet (1 mg total) by mouth every morning. 90 tablet 1  . Glucosamine-Chondroit-Vit C-Mn (GLUCOSAMINE CHONDR 500 COMPLEX) CAPS Take 1 capsule by mouth daily.    . hydrocortisone 0.5 % cream Apply 1 application topically 2 (two) times daily as needed. rash    . ibuprofen (ADVIL,MOTRIN) 200 MG tablet Take 200 mg by mouth daily as needed for mild pain.     Marland Kitchen levothyroxine (SYNTHROID, LEVOTHROID) 50 MCG tablet  Take 1 tablet (50 mcg total) by mouth daily before breakfast. 90 tablet 3  . mirabegron ER (MYRBETRIQ) 25 MG TB24 tablet Take 25 mg by mouth daily.    Stephen Robbins Glycol-Propyl Glycol (SYSTANE ULTRA) 0.4-0.3 % SOLN Place 1 drop into both eyes daily as needed (dry eyes).     . sertraline (ZOLOFT) 25 MG tablet Take 25 mg by mouth every morning.    . simvastatin (ZOCOR) 20 MG tablet Take 1 tablet (20 mg total) by mouth every evening. 90 tablet 1  . tadalafil (CIALIS) 5 MG tablet Take 5 mg by mouth every morning.     . tamsulosin (FLOMAX) 0.4 MG CAPS capsule Take 1 capsule (0.4 mg total) by mouth every morning. 30 capsule 5  . vitamin B-12 (CYANOCOBALAMIN) 1000 MCG tablet Take 1,000 mcg by mouth every morning.      No current facility-administered medications for this visit.    Past Medical History  Diagnosis Date  . Hyperlipidemia   . Hemorrhoids   . Hypothyroidism   . Asymptomatic bilateral carotid artery stenosis     per duplex  05-15-2012  left >39%/   right 40-59%  . Bradycardia   . Fatigue   . Dizzy   . BPH (benign prostatic hypertrophy) with urinary obstruction   . NSVT (nonsustained ventricular tachycardia)     cardiologist-  dr Jawad Wiacek  .  Chronotropic incompetence with sinus node dysfunction   . Borderline diabetes   . GERD (gastroesophageal reflux disease)     occasional  . Compression of lumbar vertebra     L4 -- L5  . Compressed cervical disc   . Frequency of urination   . Urgency of urination   . Nocturia   . Wears glasses   . Pacemaker   . Bronchial pneumonia 1958  . Dysmetabolic syndrome   . Arthritis     "joints; a little" (01/04/2014)  . Basal cell carcinoma     nose    Past Surgical History  Procedure Laterality Date  . Neuroplasty / transposition ulnar nerve at elbow Left 02-09-2010  . Closed reduction nasal fracture  09-01-2007  . Exercise tolerence test  12-03-2012  DR CROITURO    CHRONOTROPIC INCOMPETENCE/ NORMAL RESTING BP W/ APPROPRIATE RESPONSE/  NO CHEST PAIN/ NO ST CHANGES FROM BASELINE  . Laceration repair Right 1978    middle finger  . Tonsillectomy and adenoidectomy  1944  . Cystoscopy N/A 12/28/2012    Procedure: CYSTOSCOPY;  Surgeon: Bernestine Amass, MD;  Location: [facility];  Service: Urology;  Laterality: N/A;  . Transurethral incision of prostate N/A 12/28/2012    Procedure: TRANSURETHRAL INCISION OF THE PROSTATE (TUIP);  Surgeon: Bernestine Amass, MD;  Location[facility];  Service: Urology;  Laterality: N/A;  . Ulnar nerve transposition    . Insert / replace / remove pacemaker  01/04/2014    [employer] model L121 serial number [id number]  . Basal cell carcinoma excision      nose  . Permanent pacemaker insertion N/A 01/04/2014    Procedure: PERMANENT PACEMAKER INSERTION;  Surgeon: Sanda Klein, MD;  Location: [facility];  Service: Cardiovascular;  Laterality: N/A;    Family History  Problem Relation Age of Onset  . Stroke Father   . Lung cancer Mother     History   Social History  . Marital Status: Married    Spouse Name: N/A    Number of Children: 0  . Years of Education: N/A   Occupational History  . retired     Programme researcher, broadcasting/film/video    Social History Main Topics  . Smoking status: Former Smoker -- 1.00 packs/day for 10 years    Types: Cigarettes    Quit date: 03/25/1968  . Smokeless tobacco: Never Used  . Alcohol Use: 4.2 oz/week    7 Glasses of wine per week     Comment: 1 glass wine daily  . Drug Use: No  . Sexual Activity: Not Currently   Other Topics Concern  . Not on file   Social History Narrative   Regular exercise          Review of systems: Complains of fatigue The patient specifically denies any chest pain at rest or with exertion, dyspnea at rest or with exertion, orthopnea, paroxysmal nocturnal dyspnea, syncope, palpitations, focal neurological deficits, intermittent claudication, lower extremity edema, unexplained weight gain, cough,  hemoptysis or wheezing.  The patient also denies abdominal pain, nausea, vomiting, dysphagia, diarrhea, constipation, polyuria, polydipsia, dysuria, hematuria, frequency, urgency, abnormal bleeding or bruising, fever, chills, unexpected weight changes, mood swings, change in skin or hair texture, change in voice quality, auditory or visual problems, allergic reactions or rashes, new musculoskeletal complaints other than usual "aches and pains".   PHYSICAL EXAM BP 112/60 mmHg  Pulse 68  Resp 16  Ht 5\' 9"  (1.753 m)  Wt 173 lb 1.6  oz (78.518 kg)  BMI 25.55 kg/m2  General: Alert, oriented x3, no distress Head: no evidence of trauma, PERRL, EOMI, no exophtalmos or lid lag, no myxedema, no xanthelasma; normal ears, nose and oropharynx Neck: normal jugular venous pulsations and no hepatojugular reflux; brisk carotid pulses without delay and no carotid bruits Chest: clear to auscultation, no signs of consolidation by percussion or palpation, normal fremitus, symmetrical and full respiratory excursions, well-healed surgical site Cardiovascular: normal position and quality of the apical impulse, regular rhythm, normal first and second heart sounds, no murmurs, rubs or gallops Abdomen: no tenderness or distention, no masses by palpation, no abnormal pulsatility or arterial bruits, normal bowel sounds, no hepatosplenomegaly Extremities: no clubbing, cyanosis or edema; 2+ radial, ulnar and brachial pulses bilaterally; 2+ right femoral, posterior tibial and dorsalis pedis pulses; 2+ left femoral, posterior tibial and dorsalis pedis pulses; no subclavian or femoral bruits Neurological: grossly nonfocal  Lipid Panel     Component Value Date/Time   CHOL 117 02/27/2011 1038   TRIG 126.0 02/27/2011 1038   TRIG 83 02/20/2006 0838   HDL 29.70* 02/27/2011 1038   CHOLHDL 4 02/27/2011 1038   VLDL 25.2 02/27/2011 1038   LDLCALC 62 02/27/2011 1038    BMET    Component Value Date/Time   NA 137 12/29/2013  1627   K 4.1 12/29/2013 1627   CL 102 12/29/2013 1627   CO2 25 12/29/2013 1627   GLUCOSE 102* 12/29/2013 1627   GLUCOSE 108* 02/20/2006 0838   BUN 16 12/29/2013 1627   CREATININE 0.77 12/29/2013 1627   CREATININE 0.6 08/09/2013 1313   CALCIUM 9.6 12/29/2013 1627   GFRNONAA 121.77 09/26/2009 0859   GFRAA 123 09/10/2007 0832     ASSESSMENT AND PLAN  Will turn on accelerometer settings and reevaluate heart rate response in 3-6 months, planning to gradually adjust the sensor until we achieve satisfactory symptom relief.  Holli Humbles, MD, [place] [phone] office [phone] pager

## 2014-04-06 NOTE — Telephone Encounter (Signed)
Spoke to pt, told him we discussed the other day that Rx had to be changed from Invokana to Jardaince 25 mg and [facility]. Pt verbalized understanding and that's right I thought maybe you had some samples. Told pt sorry we do not carry samples anymore. Pt verbalized understanding.

## 2014-04-06 NOTE — Telephone Encounter (Signed)
Pt called to say that he only has 2 INVOKANA His mail order will not arrive before he runs out . He is asking if a few pills can be called in to the [facility] on [address]

## 2014-04-08 LAB — MDC_IDC_ENUM_SESS_TYPE_INCLINIC
Battery Remaining Longevity: 15
Brady Statistic RA Percent Paced: 22 %
Brady Statistic RV Percent Paced: 1 % — CL
Date Time Interrogation Session: 20160112050000
Implantable Pulse Generator Serial Number: 702951
Lead Channel Impedance Value: 562 Ohm
Lead Channel Impedance Value: 589 Ohm
Lead Channel Pacing Threshold Amplitude: 0.7 V
Lead Channel Pacing Threshold Amplitude: 2.5 V
Lead Channel Pacing Threshold Pulse Width: 0.4 ms
Lead Channel Pacing Threshold Pulse Width: 0.4 ms
Lead Channel Sensing Intrinsic Amplitude: 7.9 mV
Lead Channel Sensing Intrinsic Amplitude: 8.7 mV
Lead Channel Setting Pacing Amplitude: 2 V
Lead Channel Setting Pacing Amplitude: 3 V
Lead Channel Setting Pacing Pulse Width: 0.4 ms
Lead Channel Setting Sensing Sensitivity: 2.5 mV
Zone Setting Detection Interval: 353 ms

## 2014-04-13 ENCOUNTER — Encounter: Payer: Self-pay | Admitting: Cardiovascular Disease

## 2014-04-26 ENCOUNTER — Ambulatory Visit (INDEPENDENT_AMBULATORY_CARE_PROVIDER_SITE_OTHER): Payer: Medicare Other | Admitting: *Deleted

## 2014-04-26 ENCOUNTER — Telehealth: Payer: Self-pay | Admitting: Cardiology

## 2014-04-26 DIAGNOSIS — I495 Sick sinus syndrome: Secondary | ICD-10-CM

## 2014-04-26 LAB — MDC_IDC_ENUM_SESS_TYPE_INCLINIC: Implantable Pulse Generator Serial Number: 702951

## 2014-04-26 NOTE — Progress Notes (Signed)
Pt seen in clinic for alert on Latitude. On Latitude check Rwaves measured 2.7mV. In office, Rwaves measured 7.73mV. Pt was reassured of device functioning properly. Follow up as planned.

## 2014-04-26 NOTE — Telephone Encounter (Signed)
Spoke w/ pt and informed him that we received a message and that we need him to come into office so we can re check this issue. Pt verbalized understanding and agreed to come to office today at 10:30 AM.

## 2014-05-12 ENCOUNTER — Encounter: Payer: Self-pay | Admitting: Cardiovascular Disease

## 2014-05-17 ENCOUNTER — Telehealth: Payer: Self-pay | Admitting: *Deleted

## 2014-05-17 ENCOUNTER — Encounter: Payer: Self-pay | Admitting: Cardiovascular Disease

## 2014-05-17 ENCOUNTER — Ambulatory Visit (INDEPENDENT_AMBULATORY_CARE_PROVIDER_SITE_OTHER): Payer: Medicare Other | Admitting: Cardiovascular Disease

## 2014-05-17 VITALS — BP 102/60 | HR 66 | Ht 69.0 in | Wt 175.5 lb

## 2014-05-17 DIAGNOSIS — Z950 Presence of cardiac pacemaker: Secondary | ICD-10-CM | POA: Diagnosis not present

## 2014-05-17 DIAGNOSIS — I495 Sick sinus syndrome: Secondary | ICD-10-CM | POA: Diagnosis not present

## 2014-05-17 DIAGNOSIS — R001 Bradycardia, unspecified: Secondary | ICD-10-CM

## 2014-05-17 DIAGNOSIS — G909 Disorder of the autonomic nervous system, unspecified: Secondary | ICD-10-CM | POA: Diagnosis not present

## 2014-05-17 DIAGNOSIS — G908 Other disorders of autonomic nervous system: Secondary | ICD-10-CM

## 2014-05-17 LAB — MDC_IDC_ENUM_SESS_TYPE_INCLINIC
Battery Remaining Longevity: 14.5
Brady Statistic RA Percent Paced: 28 %
Brady Statistic RV Percent Paced: 1 % — CL
Date Time Interrogation Session: 20160223050000
Implantable Pulse Generator Serial Number: 702951
Lead Channel Impedance Value: 569 Ohm
Lead Channel Impedance Value: 593 Ohm
Lead Channel Pacing Threshold Amplitude: 0.7 V
Lead Channel Pacing Threshold Amplitude: 1.7 V
Lead Channel Pacing Threshold Pulse Width: 0.4 ms
Lead Channel Pacing Threshold Pulse Width: 1 ms
Lead Channel Sensing Intrinsic Amplitude: 6.6 mV
Lead Channel Sensing Intrinsic Amplitude: 8 mV
Lead Channel Setting Pacing Amplitude: 2 V
Lead Channel Setting Pacing Amplitude: 3.4 V
Lead Channel Setting Pacing Pulse Width: 0.4 ms
Lead Channel Setting Sensing Sensitivity: 2.5 mV
Zone Setting Detection Interval: 353 ms

## 2014-05-17 MED ORDER — TESTOSTERONE 30 MG/ACT TD SOLN: TRANSDERMAL | Status: DC

## 2014-05-17 NOTE — Telephone Encounter (Signed)
Left message on voicemail to call office.  

## 2014-05-17 NOTE — Telephone Encounter (Signed)
Okay to refill for 1 month Schedule office visit within the next month to discuss risks vs  benefits of therapy

## 2014-05-17 NOTE — Telephone Encounter (Signed)
Received fax requesting refill for Axiron 30 mg/act, place 2 applicatorfuls onto the skin once daily. Please advise if okay to refill?

## 2014-05-17 NOTE — Patient Instructions (Addendum)
Dr. Sallyanne Kuster recommends that you schedule a follow-up appointment in:  3 MONTHS with pacemaker check.

## 2014-05-19 ENCOUNTER — Encounter: Payer: Self-pay | Admitting: Cardiovascular Disease

## 2014-05-19 NOTE — Telephone Encounter (Signed)
Spoke to pt, told him when I went to refill Testosterone it showed it was not covered by insurance and Dr.K said he would refill for one month only that you need a follow up. Told pt need to contact insurance to see which Testosterone is covered. Pt verbalized understanding and stated that he has an appointment next month and he may have enough till appt but will contact insurance to see what is covered and if needs refill will call back. Told him okay.

## 2014-05-19 NOTE — Progress Notes (Signed)
Patient ID: Stephen Robbins, male   DOB: 1936-01-05, 79 y.o.   MRN: [id number]     Reason for office visit Sinus node bradycardia, chronotropic incompetence, pacemaker check  Mr. Morash returns for follow-up for symptomatic bradycardia status post implantation of a dual-chamber permanent pacemaker. There has been a slight improvement in his heart rate response to activity but this remains fairly effective. Pacemaker function is otherwise normal.  We increased both the minute ventilation sensor and the accelerometer today and plan to see him back in 3 months for further adjustments.   Allergies  Allergen Reactions  . Penicillins Other (See Comments)    Unknown childhood reaction    Current Outpatient Prescriptions  Medication Sig Dispense Refill  . acetaminophen (TYLENOL) 325 MG tablet Take 1-2 tablets (325-650 mg total) by mouth every 4 (four) hours as needed for mild pain.    Marland Kitchen ALPRAZolam (XANAX) 0.25 MG tablet Take 0.25 mg by mouth 2 (two) times daily as needed.     Marland Kitchen aspirin 81 MG tablet Take 81 mg by mouth daily.    . B Complex Vitamins (B COMPLEX 50) TABS Take 1 tablet by mouth every morning.     . carbidopa-levodopa (SINEMET IR) 25-100 MG per tablet Take 1 tablet by mouth 3 (three) times daily before meals.    . empagliflozin (JARDIANCE) 25 MG TABS tablet Take 25 mg by mouth daily. 90 tablet 1  . fish oil-omega-3 fatty acids 1000 MG capsule Take 2 g by mouth daily.    . folic acid (FOLVITE) 1 MG tablet Take 1 tablet (1 mg total) by mouth every morning. 90 tablet 1  . hydrocortisone 0.5 % cream Apply 1 application topically 2 (two) times daily as needed. rash    . ibuprofen (ADVIL,MOTRIN) 200 MG tablet Take 200 mg by mouth daily as needed for mild pain.     Marland Kitchen levothyroxine (SYNTHROID, LEVOTHROID) 50 MCG tablet Take 1 tablet (50 mcg total) by mouth daily before breakfast. 90 tablet 3  . mirabegron ER (MYRBETRIQ) 25 MG TB24 tablet Take 25 mg by mouth daily.    Vladimir Faster Glycol-Propyl  Glycol (SYSTANE ULTRA) 0.4-0.3 % SOLN Place 1 drop into both eyes daily as needed (dry eyes).     . sertraline (ZOLOFT) 25 MG tablet Take 25 mg by mouth every morning.    . simvastatin (ZOCOR) 20 MG tablet Take 1 tablet (20 mg total) by mouth every evening. 90 tablet 1  . tadalafil (CIALIS) 5 MG tablet Take 5 mg by mouth every morning.     . tamsulosin (FLOMAX) 0.4 MG CAPS capsule Take 1 capsule (0.4 mg total) by mouth every morning. 30 capsule 5  . Testosterone 30 MG MISC 30 mg daily.    . vitamin B-12 (CYANOCOBALAMIN) 1000 MCG tablet Take 1,000 mcg by mouth every morning.     . Testosterone (AXIRON) 30 MG/ACT SOLN PLACE 2 APPLICATORFULS ONTO THE SKIN ONCE DAILY 90 mL 0   No current facility-administered medications for this visit.    Past Medical History  Diagnosis Date  . Hyperlipidemia   . Hemorrhoids   . Hypothyroidism   . Asymptomatic bilateral carotid artery stenosis     per duplex  05-15-2012  left >39%/   right 40-59%  . Bradycardia   . Fatigue   . Dizzy   . BPH (benign prostatic hypertrophy) with urinary obstruction   . NSVT (nonsustained ventricular tachycardia)     cardiologist-  dr Kona Yusuf  . Chronotropic incompetence with sinus  node dysfunction   . Borderline diabetes   . GERD (gastroesophageal reflux disease)     occasional  . Compression of lumbar vertebra     L4 -- L5  . Compressed cervical disc   . Frequency of urination   . Urgency of urination   . Nocturia   . Wears glasses   . Pacemaker   . Bronchial pneumonia 1958  . Dysmetabolic syndrome   . Arthritis     "joints; a little" (01/04/2014)  . Basal cell carcinoma     nose    Past Surgical History  Procedure Laterality Date  . Neuroplasty / transposition ulnar nerve at elbow Left 02-09-2010  . Closed reduction nasal fracture  09-01-2007  . Exercise tolerence test  12-03-2012  DR CROITURO    CHRONOTROPIC INCOMPETENCE/ NORMAL RESTING BP W/ APPROPRIATE RESPONSE/ NO CHEST PAIN/ NO ST CHANGES FROM  BASELINE  . Laceration repair Right 1978    middle finger  . Tonsillectomy and adenoidectomy  1944  . Cystoscopy N/A 12/28/2012    Procedure: CYSTOSCOPY;  Surgeon: Bernestine Amass, MD;  Location: [facility];  Service: Urology;  Laterality: N/A;  . Transurethral incision of prostate N/A 12/28/2012    Procedure: TRANSURETHRAL INCISION OF THE PROSTATE (TUIP);  Surgeon: Bernestine Amass, MD;  Location: [facility];  Service: Urology;  Laterality: N/A;  . Ulnar nerve transposition    . Insert / replace / remove pacemaker  01/04/2014    [employer] model L121 serial number [id number]  . Basal cell carcinoma excision      nose  . Permanent pacemaker insertion N/A 01/04/2014    Procedure: PERMANENT PACEMAKER INSERTION;  Surgeon: Sanda Klein, MD;  Location: [facility];  Service: Cardiovascular;  Laterality: N/A;    Family History  Problem Relation Age of Onset  . Stroke Father   . Lung cancer Mother     History   Social History  . Marital Status: Married    Spouse Name: N/A  . Number of Children: 0  . Years of Education: N/A   Occupational History  . retired     Programme researcher, broadcasting/film/video    Social History Main Topics  . Smoking status: Former Smoker -- 1.00 packs/day for 10 years    Types: Cigarettes    Quit date: 03/25/1968  . Smokeless tobacco: Never Used  . Alcohol Use: 4.2 oz/week    7 Glasses of wine per week     Comment: 1 glass wine daily  . Drug Use: No  . Sexual Activity: Not Currently   Other Topics Concern  . Not on file   Social History Narrative   Regular exercise          Review of systems: Complains of fatigue The patient specifically denies any chest pain at rest or with exertion, dyspnea at rest or with exertion, orthopnea, paroxysmal nocturnal dyspnea, syncope, palpitations, focal neurological deficits, intermittent claudication, lower extremity edema, unexplained weight gain, cough, hemoptysis or wheezing.  The patient also  denies abdominal pain, nausea, vomiting, dysphagia, diarrhea, constipation, polyuria, polydipsia, dysuria, hematuria, frequency, urgency, abnormal bleeding or bruising, fever, chills, unexpected weight changes, mood swings, change in skin or hair texture, change in voice quality, auditory or visual problems, allergic reactions or rashes, new musculoskeletal complaints other than usual "aches and pains".   PHYSICAL EXAM BP 102/60 mmHg  Pulse 66  Ht 5\' 9"  (1.753 m)  Wt 175 lb 8 oz (79.606 kg)  BMI 25.90 kg/m2 General:  Alert, oriented x3, no distress Head: no evidence of trauma, PERRL, EOMI, no exophtalmos or lid lag, no myxedema, no xanthelasma; normal ears, nose and oropharynx Neck: normal jugular venous pulsations and no hepatojugular reflux; brisk carotid pulses without delay and no carotid bruits Chest: clear to auscultation, no signs of consolidation by percussion or palpation, normal fremitus, symmetrical and full respiratory excursions, well-healed surgical site Cardiovascular: normal position and quality of the apical impulse, regular rhythm, normal first and second heart sounds, no murmurs, rubs or gallops Abdomen: no tenderness or distention, no masses by palpation, no abnormal pulsatility or arterial bruits, normal bowel sounds, no hepatosplenomegaly Extremities: no clubbing, cyanosis or edema; 2+ radial, ulnar and brachial pulses bilaterally; 2+ right femoral, posterior tibial and dorsalis pedis pulses; 2+ left femoral, posterior tibial and dorsalis pedis pulses; no subclavian or femoral bruits Neurological: grossly nonfocal   Lipid Panel     Component Value Date/Time   CHOL 117 02/27/2011 1038   TRIG 126.0 02/27/2011 1038   TRIG 83 02/20/2006 0838   HDL 29.70* 02/27/2011 1038   CHOLHDL 4 02/27/2011 1038   CHOLHDL 3.4 CALC 02/20/2006 0838   VLDL 25.2 02/27/2011 1038   LDLCALC 62 02/27/2011 1038    BMET    Component Value Date/Time   NA 137 12/29/2013 1627   K 4.1  12/29/2013 1627   CL 102 12/29/2013 1627   CO2 25 12/29/2013 1627   GLUCOSE 102* 12/29/2013 1627   GLUCOSE 108* 02/20/2006 0838   BUN 16 12/29/2013 1627   CREATININE 0.77 12/29/2013 1627   CREATININE 0.6 08/09/2013 1313   CALCIUM 9.6 12/29/2013 1627   GFRNONAA 121.77 09/26/2009 0859   GFRAA 123 09/10/2007 0832     ASSESSMENT AND PLAN  Continues to have evidence of chronotropic incompetence. Both accelerometer and minute ventilation sensors adjusted today. Will not plan any further adjustments until about 3 months of data is collected.  Orders Placed This Encounter  Procedures  . Implantable device check   Meds ordered this encounter  Medications  . Testosterone 30 MG MISC    Sig: 30 mg daily.    Holli Humbles, MD, [place] [phone] office [phone] pager

## 2014-05-23 ENCOUNTER — Encounter: Payer: Self-pay | Admitting: Neurology

## 2014-05-23 ENCOUNTER — Ambulatory Visit (INDEPENDENT_AMBULATORY_CARE_PROVIDER_SITE_OTHER): Payer: Medicare Other | Admitting: Neurology

## 2014-05-23 VITALS — BP 122/60 | HR 68 | Ht 69.0 in | Wt 174.0 lb

## 2014-05-23 DIAGNOSIS — E1342 Other specified diabetes mellitus with diabetic polyneuropathy: Secondary | ICD-10-CM

## 2014-05-23 DIAGNOSIS — G629 Polyneuropathy, unspecified: Secondary | ICD-10-CM

## 2014-05-23 DIAGNOSIS — I6521 Occlusion and stenosis of right carotid artery: Secondary | ICD-10-CM

## 2014-05-23 DIAGNOSIS — E1142 Type 2 diabetes mellitus with diabetic polyneuropathy: Secondary | ICD-10-CM

## 2014-05-23 DIAGNOSIS — G20 Parkinson's disease: Secondary | ICD-10-CM

## 2014-05-23 MED ORDER — CARBIDOPA-LEVODOPA 25-100 MG PO TABS: 1.0000 | ORAL_TABLET | Freq: Three times a day (TID) | ORAL | Status: DC

## 2014-05-23 NOTE — Progress Notes (Signed)
Stephen Robbins was seen today in f/u in the movement clinic.  He was dx with PD last visit, on 06/17/13.  He is on carbidopa/levodopa 25/100 tid.  He has no side effects with the carbidopa/levodopa, but states that "I just don't know what to expect."  He continues to have tremor, which can be bothersome for him.  He has had no falls.  No lightheadedness.  No near syncope.  No hallucinations.  He had a carotid ultrasound done since last visit.  There is 1-39% stenosis bilaterally.  He did have some lab work done since last visit.  His hemoglobin A1c has greatly improved.  It is 6.3 (was 7.9 and prior to that was 9.4). He just finished PT and is enrolled in the exercise class now.  He presents today with a long list of questions.  He again tape records our session.    11/19/13 update:  Pt is f/u regarding Parkinson's disease.  He is currently on carbidopa/levodopa 25/100, one tablet 3 times per day. He is accompanied by his wife, who supplements the history.   Patient denies any falls.  He denies hallucinations.  Denies nausea or vomiting.  No lightheadedness or near syncope.  He is exercising.  He still has tremor and isn't sure that the levodopa helps tremor.  He sent me several e-mails since last visit.  One of these was just a TV news link regarding [facility] high-resolution imaging and deep brain stimulation.  He subsequently sent another e-mail regarding focused ultrasound.  Asks about updates regarding these and has 2 pages of questions.  Asks about why we can't give him human growth hormone or steroids for PD.  Overall, patient is clinically doing well.   02/21/14 update:   Pt returns for f/u, accompanied by his wife who supplements the history.  The records that were made available to me were reviewed.  Pt has had PPM since last visit due to bradycardia.  He has been told not to exercise until 12/9.  He will start with [facility] moves classes after the first of the year as well as circuit 2 classes.  He is still  on carbidopa/levodopa 25/100 at 8-9am/12-1pm/6-7 pm.  He is c/o paresthesias/"neuropathy" in the R foot.  He only notices it with driving.   He has no back pain.  He has no sensation of balance loss with closing eyes in the shower.  He thinks that exposure to levodopa causes the neuropathy and brings with him a small study (58 pts) that suggests that levodopa could be an etiology for the neuropathy.  Notices lack of smell.  No falls.  Rare lightheadedness. No hallucinations.    05/23/14 update:  Pt is f/u today, accompanied by his wife who supplements the history.  [facility] and I reviewed those records.  He was told he had PN (known diagnosis) but no new meds were prescribed.  He asks about a "cure" for PN that a doctor in [place] was advertising.   He remains on carbidopa/levodopa 25/100 tid for the PD.  He is exercising faithfully.  He has trouble getting himself over 3 miles per hour when walking and he is concerned about that.  He asks again about taking steroids and human growth hormone.  He also asks about taking protein supplements.  He has d/c the glucosamine for joint pain/arthritis as he didn't think that it was helpful.    No neuroimaging since CT brain  in 2009.  PREVIOUS MEDICATIONS: none to date  ALLERGIES:   Allergies  Allergen Reactions  . Penicillins Other (See Comments)    Unknown childhood reaction    CURRENT MEDICATIONS:  Current Outpatient Prescriptions on File Prior to Visit  Medication Sig Dispense Refill  . acetaminophen (TYLENOL) 325 MG tablet Take 1-2 tablets (325-650 mg total) by mouth every 4 (four) hours as needed for mild pain.    Marland Kitchen ALPRAZolam (XANAX) 0.25 MG tablet Take 0.25 mg by mouth 2 (two) times daily as needed.     Marland Kitchen aspirin 81 MG tablet Take 81 mg by mouth daily.    . B Complex Vitamins (B COMPLEX 50) TABS Take 1 tablet by mouth every morning.     . empagliflozin (JARDIANCE) 25 MG TABS tablet Take 25 mg by mouth  daily. 90 tablet 1  . fish oil-omega-3 fatty acids 1000 MG capsule Take 2 g by mouth daily.    . folic acid (FOLVITE) 1 MG tablet Take 1 tablet (1 mg total) by mouth every morning. 90 tablet 1  . hydrocortisone 0.5 % cream Apply 1 application topically 2 (two) times daily as needed. rash    . ibuprofen (ADVIL,MOTRIN) 200 MG tablet Take 200 mg by mouth daily as needed for mild pain.     Marland Kitchen levothyroxine (SYNTHROID, LEVOTHROID) 50 MCG tablet Take 1 tablet (50 mcg total) by mouth daily before breakfast. 90 tablet 3  . mirabegron ER (MYRBETRIQ) 25 MG TB24 tablet Take 25 mg by mouth daily.    Vladimir Faster Glycol-Propyl Glycol (SYSTANE ULTRA) 0.4-0.3 % SOLN Place 1 drop into both eyes daily as needed (dry eyes).     . sertraline (ZOLOFT) 25 MG tablet Take 25 mg by mouth every morning.    . simvastatin (ZOCOR) 20 MG tablet Take 1 tablet (20 mg total) by mouth every evening. 90 tablet 1  . tadalafil (CIALIS) 5 MG tablet Take 5 mg by mouth every morning.     . tamsulosin (FLOMAX) 0.4 MG CAPS capsule Take 1 capsule (0.4 mg total) by mouth every morning. 30 capsule 5  . Testosterone (AXIRON) 30 MG/ACT SOLN PLACE 2 APPLICATORFULS ONTO THE SKIN ONCE DAILY 90 mL 0  . vitamin B-12 (CYANOCOBALAMIN) 1000 MCG tablet Take 1,000 mcg by mouth every morning.      No current facility-administered medications on file prior to visit.    PAST MEDICAL HISTORY:   Past Medical History  Diagnosis Date  . Hyperlipidemia   . Hemorrhoids   . Hypothyroidism   . Asymptomatic bilateral carotid artery stenosis     per duplex  05-15-2012  left >39%/   right 40-59%  . Bradycardia   . Fatigue   . Dizzy   . BPH (benign prostatic hypertrophy) with urinary obstruction   . NSVT (nonsustained ventricular tachycardia)     cardiologist-  dr croitoru  . Chronotropic incompetence with sinus node dysfunction   . Borderline diabetes   . GERD (gastroesophageal reflux disease)     occasional  . Compression of lumbar vertebra     L4  -- L5  . Compressed cervical disc   . Frequency of urination   . Urgency of urination   . Nocturia   . Wears glasses   . Pacemaker   . Bronchial pneumonia 1958  . Dysmetabolic syndrome   . Arthritis     "joints; a little" (01/04/2014)  . Basal cell carcinoma     nose    PAST SURGICAL HISTORY:  Past Surgical History  Procedure Laterality Date  . Neuroplasty / transposition ulnar nerve at elbow Left 02-09-2010  . Closed reduction nasal fracture  09-01-2007  . Exercise tolerence test  12-03-2012  DR CROITURO    CHRONOTROPIC INCOMPETENCE/ NORMAL RESTING BP W/ APPROPRIATE RESPONSE/ NO CHEST PAIN/ NO ST CHANGES FROM BASELINE  . Laceration repair Right 1978    middle finger  . Tonsillectomy and adenoidectomy  1944  . Cystoscopy N/A 12/28/2012    Procedure: CYSTOSCOPY;  Surgeon: Bernestine Amass, MD;  Location: [facility];  Service: Urology;  Laterality: N/A;  . Transurethral incision of prostate N/A 12/28/2012    Procedure: TRANSURETHRAL INCISION OF THE PROSTATE (TUIP);  Surgeon: Bernestine Amass, MD;  Location: [facility];  Service: Urology;  Laterality: N/A;  . Ulnar nerve transposition    . Insert / replace / remove pacemaker  01/04/2014    [employer] model L121 serial number [id number]  . Basal cell carcinoma excision      nose  . Permanent pacemaker insertion N/A 01/04/2014    Procedure: PERMANENT PACEMAKER INSERTION;  Surgeon: Sanda Klein, MD;  Location: [place];  Service: Cardiovascular;  Laterality: N/A;    SOCIAL HISTORY:   History   Social History  . Marital Status: Married    Spouse Name: N/A  . Number of Children: 0  . Years of Education: N/A   Occupational History  . retired     Programme researcher, broadcasting/film/video    Social History Main Topics  . Smoking status: Former Smoker -- 1.00 packs/day for 10 years    Types: Cigarettes    Quit date: 03/25/1968  . Smokeless tobacco: Never Used  . Alcohol Use: 4.2 oz/week    7 Glasses of wine  per week     Comment: 1 glass wine daily  . Drug Use: No  . Sexual Activity: Not Currently   Other Topics Concern  . Not on file   Social History Narrative   Regular exercise          FAMILY HISTORY:   Family Status  Relation Status Death Age  . Father Deceased     stroke  . Mother Deceased     lung cancer  . Brother Alive     healthy    ROS:    A complete 10 system review of systems was obtained and was unremarkable apart from what is mentioned above.  PHYSICAL EXAMINATION:    VITALS:   Filed Vitals:   05/23/14 1443  BP: 122/60  Pulse: 68  Height: 5\' 9"  (1.753 m)  Weight: 174 lb (78.926 kg)   My medical assistant was present for the entire history/PE.  GEN:  The patient appears stated age and is in NAD. HEENT:  [place]/AT.  MMM CV:  RRR Lungs:  CTAB Neck:  No bruits   Neurological examination:  Orientation: The patient is alert and oriented x3. Fund of knowledge is appropriate.  Recent and remote memory are intact.  Cranial nerves: There is good facial symmetry. Mild facial hypomimia.  Pupils are equal round and reactive to light bilaterally. Fundoscopic exam reveals clear margins bilaterally. Extraocular muscles are intact. The visual fields are full to confrontational testing. The speech is fluent and clear.  Soft palate rises symmetrically and there is no tongue deviation. Hearing is intact to conversational tone. Motor: Strength is 5/5 in the bilateral upper and lower extremities.   Shoulder shrug is equal and symmetric.  There is no pronator  drift. Sensation:  Intact to light touch.  More sensitive aspects not tested today  Movement examination: Tone: There is no rigidity.   Abnormal movements: There is no tremor noted today Coordination:  There are normal RAM's in the UE/LE today. Gait and Station: The patient has no difficulty arising out of a deep-seated chair without the use of the hands. The patient's stride length is fairly normal with good arm swing.   The patient has a negative pull test.      LABS:  Lab Results  Component Value Date   TSH 2.16 08/31/2012   Lab Results  Component Value Date   VITAMINB12 872 06/17/2013     Chemistry      Component Value Date/Time   NA 137 12/29/2013 1627   K 4.1 12/29/2013 1627   CL 102 12/29/2013 1627   CO2 25 12/29/2013 1627   BUN 16 12/29/2013 1627   CREATININE 0.77 12/29/2013 1627   CREATININE 0.6 08/09/2013 1313      Component Value Date/Time   CALCIUM 9.6 12/29/2013 1627   ALKPHOS 34* 08/09/2013 1313   AST 32 08/09/2013 1313   ALT 27 08/09/2013 1313   BILITOT 1.3* 08/09/2013 1313     RPR negative Lab Results  Component Value Date   FOLATE >20.0 06/17/2013     Lab Results  Component Value Date   HGBA1C 6.6* 12/14/2013    ASSESSMENT/PLAN:  1. idiopathic Parkinson's disease.  I see no atypical features.    -We discussed the diagnosis as well as pathophysiology of the disease.  We discussed treatment options as well as prognostic indicators.  Patient education was provided.  He plans to attend the march educational event in [place] than 50% of the 30 minute visit was again spent in counseling answering questions and talking about what to expect now as well as in the future.  We talked about medication options as well as potential future surgical options.   We talked about safety in the home.  We talked extensively about exercise and its benefits.  We talked about various types of exercise.  We talked about programs in the community.  He is already enrolled in the Parkinson's exercise program here.  -He will continue the carbidopa/levodopa 25/100, one tablet 3 times daily.  I will refill this today.    -Explained again today and on prior visit to patient that there is no science behind human growth hormone or steroids for PD.  Explained the long term SE of steroids and I could think of no beneficial reason for a PD patient to be on these.    -Pt asked about protein  supplements.  No objection to protein but explained  effect on levodopa and should not take it with levodopa (same time).    -talked about the mPower app and recommended it to him.   2.  Peripheral neuropathy  -There is evidence of this on examination.  Likely from DM.  His workup for reversible causes was negative.  His diabetes is under much better control than in the past.  -explained to the patient that I don't think that the levodopa is the primary contributor (or even a contributor at all) to peripheral neuropathy as he read a small study about this.    -explained to the patient that there is no cure for PN, as he heard on an advertisement.  [facility]   3.  Hx of transaminasemia  -  These were elevated in 2012 but have improved 4.  R carotid bruit with hx of carotid stenosis  -04/2012 u/s with 40-50% stenosis on the R.  this was rechecked in 2015 and was 1-39% bilaterally. 5.  Return in about 3 months (around 08/21/2014).

## 2014-05-23 NOTE — Patient Instructions (Signed)
1. Continue Carbidopa Levodopa 25/100. A refill has been sent to your pharmacy. Avoid taking this with protein. (Take at least 30 minutes prior to the consumption of protein.) You look great! 2. You can use the Mpower app from your smart phone if you are interested.  3. There is no FDA support for the use of human growth hormones or steroids in Parkinson's patients.  4. There are currently no research studies at this present time. You can check periodically with [facility] and [facility].  5. See you on March 11th!

## 2014-05-25 ENCOUNTER — Telehealth: Payer: Self-pay

## 2014-05-25 NOTE — Telephone Encounter (Signed)
Pt called stating that his neuropathy seemed to be worsening and was wanting to know if there was something that the doctor could suggest to help

## 2014-05-26 NOTE — Telephone Encounter (Signed)
Called Dr. Erasmo Downer recommendations to pt.  Pt states he is working with a nutritionist with natural supplements to manage his neuropathy.  I encouraged pt to follow up with the suggested doctors.

## 2014-05-26 NOTE — Telephone Encounter (Signed)
As he is already on multiple medications including nerve pills. Needs to discuss with his neurologist possibly additional nerve pain medications such as Lyrica or gabapentin, or Cymbalta. However this should be done through his neurologist or his diabetes doctor.  Harriet Masson DPM

## 2014-05-27 ENCOUNTER — Other Ambulatory Visit: Payer: Self-pay | Admitting: Internal Medicine

## 2014-05-27 ENCOUNTER — Telehealth: Payer: Self-pay | Admitting: Neurology

## 2014-05-27 NOTE — Telephone Encounter (Signed)
Made him aware that we have already sent [facility] to [facility] and there is no cure for neuropathy.

## 2014-05-27 NOTE — Telephone Encounter (Signed)
Pt needs to talk to someone about who he would need to see about his neuropathy please call [phone] he will be out from 10-1 today

## 2014-05-28 ENCOUNTER — Encounter: Payer: Self-pay | Admitting: Neurology

## 2014-05-30 MED ORDER — GABAPENTIN 300 MG PO CAPS: 300.0000 mg | ORAL_CAPSULE | Freq: Two times a day (BID) | ORAL | Status: DC

## 2014-05-30 NOTE — Telephone Encounter (Signed)
-----   Message from [facility], DO sent at 05/30/2014  7:49 AM EST ----- Please send RX for gabapentin 300 mg q hs for 1 week and then if tolerated may try 300 mg bid.

## 2014-05-30 NOTE — Telephone Encounter (Signed)
Prescription sent to patient's pharmacy.

## 2014-06-16 ENCOUNTER — Encounter: Payer: Self-pay | Admitting: Internal Medicine

## 2014-06-16 ENCOUNTER — Other Ambulatory Visit: Payer: Self-pay | Admitting: *Deleted

## 2014-06-16 ENCOUNTER — Ambulatory Visit (INDEPENDENT_AMBULATORY_CARE_PROVIDER_SITE_OTHER): Payer: Medicare Other | Admitting: Internal Medicine

## 2014-06-16 VITALS — BP 130/70 | HR 63 | Temp 97.8°F | Resp 20 | Ht 69.0 in | Wt 179.0 lb

## 2014-06-16 DIAGNOSIS — G20 Parkinson's disease: Secondary | ICD-10-CM | POA: Diagnosis not present

## 2014-06-16 DIAGNOSIS — G629 Polyneuropathy, unspecified: Secondary | ICD-10-CM | POA: Diagnosis not present

## 2014-06-16 DIAGNOSIS — E1142 Type 2 diabetes mellitus with diabetic polyneuropathy: Secondary | ICD-10-CM

## 2014-06-16 DIAGNOSIS — E1342 Other specified diabetes mellitus with diabetic polyneuropathy: Secondary | ICD-10-CM | POA: Diagnosis not present

## 2014-06-16 DIAGNOSIS — I6521 Occlusion and stenosis of right carotid artery: Secondary | ICD-10-CM

## 2014-06-16 LAB — CBC WITH DIFFERENTIAL/PLATELET
Basophils Absolute: 0 10*3/uL (ref 0.0–0.1)
Basophils Relative: 0.3 % (ref 0.0–3.0)
Eosinophils Absolute: 0 10*3/uL (ref 0.0–0.7)
Eosinophils Relative: 0.7 % (ref 0.0–5.0)
HCT: 45.9 % (ref 39.0–52.0)
Hemoglobin: 15.8 g/dL (ref 13.0–17.0)
Lymphocytes Relative: 20.1 % (ref 12.0–46.0)
Lymphs Abs: 1.3 10*3/uL (ref 0.7–4.0)
MCHC: 34.5 g/dL (ref 30.0–36.0)
MCV: 104 fl — ABNORMAL HIGH (ref 78.0–100.0)
Monocytes Absolute: 0.5 10*3/uL (ref 0.1–1.0)
Monocytes Relative: 8.2 % (ref 3.0–12.0)
Neutro Abs: 4.7 10*3/uL (ref 1.4–7.7)
Neutrophils Relative %: 70.7 % (ref 43.0–77.0)
Platelets: 157 10*3/uL (ref 150.0–400.0)
RBC: 4.41 Mil/uL (ref 4.22–5.81)
RDW: 14.6 % (ref 11.5–15.5)
WBC: 6.6 10*3/uL (ref 4.0–10.5)

## 2014-06-16 LAB — HEMOGLOBIN A1C: Hgb A1c MFr Bld: 5.9 % (ref 4.6–6.5)

## 2014-06-16 LAB — COMPREHENSIVE METABOLIC PANEL
ALT: 9 U/L (ref 0–53)
AST: 32 U/L (ref 0–37)
Albumin: 4.2 g/dL (ref 3.5–5.2)
Alkaline Phosphatase: 41 U/L (ref 39–117)
BUN: 13 mg/dL (ref 6–23)
CO2: 30 mEq/L (ref 19–32)
Calcium: 9.4 mg/dL (ref 8.4–10.5)
Chloride: 102 mEq/L (ref 96–112)
Creatinine, Ser: 0.73 mg/dL (ref 0.40–1.50)
GFR: 110.76 mL/min (ref >60.00)
Glucose, Bld: 101 mg/dL — ABNORMAL HIGH (ref 70–99)
Potassium: 4.1 mEq/L (ref 3.5–5.1)
Sodium: 136 mEq/L (ref 135–145)
Total Bilirubin: 1 mg/dL (ref 0.2–1.2)
Total Protein: 7.2 g/dL (ref 6.0–8.3)

## 2014-06-16 LAB — TSH: TSH: 2.8 u[IU]/mL (ref 0.35–4.50)

## 2014-06-16 LAB — MICROALBUMIN / CREATININE URINE RATIO
Creatinine,U: 75.5 mg/dL
Microalb Creat Ratio: 1.2 mg/g (ref 0.0–30.0)
Microalb, Ur: 0.9 mg/dL (ref 0.0–1.9)

## 2014-06-16 MED ORDER — FOLIC ACID 1 MG PO TABS: 1.0000 mg | ORAL_TABLET | Freq: Every morning | ORAL | Status: DC

## 2014-06-16 MED ORDER — ALPRAZOLAM 0.25 MG PO TABS: 0.2500 mg | ORAL_TABLET | Freq: Two times a day (BID) | ORAL | Status: DC | PRN

## 2014-06-16 NOTE — Progress Notes (Signed)
Subjective:    Patient ID: Stephen Robbins, male    DOB: 1935/07/27, 79 y.o.   MRN: [id number]  HPI  79 year old patient who is seen today for his six-month follow-up.  He is followed closely by neurology due to PD. He has type 2 diabetes, GERD by peripheral neuropathy.  He has hypothyroidism, hypogonadism and dyslipidemia. He has been followed by urology due to BPH. In general doing recently well.  He has some mild orthostatic dizziness from time to time  Past Medical History  Diagnosis Date  . Hyperlipidemia   . Hemorrhoids   . Hypothyroidism   . Asymptomatic bilateral carotid artery stenosis     per duplex  05-15-2012  left >39%/   right 40-59%  . Bradycardia   . Fatigue   . Dizzy   . BPH (benign prostatic hypertrophy) with urinary obstruction   . NSVT (nonsustained ventricular tachycardia)     cardiologist-  dr croitoru  . Chronotropic incompetence with sinus node dysfunction   . Borderline diabetes   . GERD (gastroesophageal reflux disease)     occasional  . Compression of lumbar vertebra     L4 -- L5  . Compressed cervical disc   . Frequency of urination   . Urgency of urination   . Nocturia   . Wears glasses   . Pacemaker   . Bronchial pneumonia 1958  . Dysmetabolic syndrome   . Arthritis     "joints; a little" (01/04/2014)  . Basal cell carcinoma     nose    History   Social History  . Marital Status: Married    Spouse Name: N/A  . Number of Children: 0  . Years of Education: N/A   Occupational History  . retired     Programme researcher, broadcasting/film/video    Social History Main Topics  . Smoking status: Former Smoker -- 1.00 packs/day for 10 years    Types: Cigarettes    Quit date: 03/25/1968  . Smokeless tobacco: Never Used  . Alcohol Use: 4.2 oz/week    7 Glasses of wine per week     Comment: 1 glass wine daily  . Drug Use: No  . Sexual Activity: Not Currently   Other Topics Concern  . Not on file   Social History Narrative   Regular exercise          Past  Surgical History  Procedure Laterality Date  . Neuroplasty / transposition ulnar nerve at elbow Left 02-09-2010  . Closed reduction nasal fracture  09-01-2007  . Exercise tolerence test  12-03-2012  DR CROITURO    CHRONOTROPIC INCOMPETENCE/ NORMAL RESTING BP W/ APPROPRIATE RESPONSE/ NO CHEST PAIN/ NO ST CHANGES FROM BASELINE  . Laceration repair Right 1978    middle finger  . Tonsillectomy and adenoidectomy  1944  . Cystoscopy N/A 12/28/2012    Procedure: CYSTOSCOPY;  Surgeon: Bernestine Amass, MD;  Location: [facility];  Service: Urology;  Laterality: N/A;  . Transurethral incision of prostate N/A 12/28/2012    Procedure: TRANSURETHRAL INCISION OF THE PROSTATE (TUIP);  Surgeon: Bernestine Amass, MD;  Location: [facility];  Service: Urology;  Laterality: N/A;  . Ulnar nerve transposition    . Insert / replace / remove pacemaker  01/04/2014    [employer] model L121 serial number [id number]  . Basal cell carcinoma excision      nose  . Permanent pacemaker insertion N/A 01/04/2014    Procedure: PERMANENT PACEMAKER INSERTION;  Surgeon: [name]  Croitoru, MD;  Location: [place] [facility];  Service: Cardiovascular;  Laterality: N/A;    Family History  Problem Relation Age of Onset  . Stroke Father   . Lung cancer Mother     Allergies  Allergen Reactions  . Penicillins Other (See Comments)    Unknown childhood reaction    Current Outpatient Prescriptions on File Prior to Visit  Medication Sig Dispense Refill  . acetaminophen (TYLENOL) 325 MG tablet Take 1-2 tablets (325-650 mg total) by mouth every 4 (four) hours as needed for mild pain.    Marland Kitchen aspirin 81 MG tablet Take 81 mg by mouth daily.    . B Complex Vitamins (B COMPLEX 50) TABS Take 1 tablet by mouth every morning.     . carbidopa-levodopa (SINEMET IR) 25-100 MG per tablet Take 1 tablet by mouth 3 (three) times daily before meals. 270 tablet 1  . empagliflozin (JARDIANCE) 25 MG TABS tablet Take 25 mg  by mouth daily. 90 tablet 1  . fish oil-omega-3 fatty acids 1000 MG capsule Take 2 g by mouth daily.    . folic acid (FOLVITE) 1 MG tablet Take 1 tablet (1 mg total) by mouth every morning. 90 tablet 1  . gabapentin (NEURONTIN) 300 MG capsule Take 1 capsule (300 mg total) by mouth 2 (two) times daily. Take one tablet at night for one week before increasing to BID 60 capsule 3  . hydrocortisone 0.5 % cream Apply 1 application topically 2 (two) times daily as needed. rash    . ibuprofen (ADVIL,MOTRIN) 200 MG tablet Take 200 mg by mouth daily as needed for mild pain.     Marland Kitchen levothyroxine (SYNTHROID, LEVOTHROID) 50 MCG tablet Take 1 tablet (50 mcg total) by mouth daily before breakfast. 90 tablet 3  . mirabegron ER (MYRBETRIQ) 25 MG TB24 tablet Take 25 mg by mouth daily.    Vladimir Faster Glycol-Propyl Glycol (SYSTANE ULTRA) 0.4-0.3 % SOLN Place 1 drop into both eyes daily as needed (dry eyes).     . sertraline (ZOLOFT) 25 MG tablet Take 25 mg by mouth every morning.    . simvastatin (ZOCOR) 20 MG tablet Take 1 tablet (20 mg total) by mouth every evening. 90 tablet 1  . tadalafil (CIALIS) 5 MG tablet Take 5 mg by mouth every morning.     . tamsulosin (FLOMAX) 0.4 MG CAPS capsule Take 1 capsule (0.4 mg total) by mouth every morning. 30 capsule 5  . vitamin B-12 (CYANOCOBALAMIN) 1000 MCG tablet Take 1,000 mcg by mouth every morning.     . Testosterone (AXIRON) 30 MG/ACT SOLN PLACE 2 APPLICATORFULS ONTO THE SKIN ONCE DAILY (Patient not taking: Reported on 06/16/2014) 90 mL 0   No current facility-administered medications on file prior to visit.    BP 130/70 mmHg  Pulse 63  Temp(Src) 97.8 F (36.6 C) (Oral)  Resp 20  Ht 5\' 9"  (1.753 m)  Wt 179 lb (81.194 kg)  BMI 26.42 kg/m2  SpO2 97%    Review of Systems  Constitutional: Negative for fever, chills, appetite change and fatigue.  HENT: Negative for congestion, dental problem, ear pain, hearing loss, sore throat, tinnitus, trouble swallowing and  voice change.   Eyes: Negative for pain, discharge and visual disturbance.  Respiratory: Negative for cough, chest tightness, wheezing and stridor.   Cardiovascular: Negative for chest pain, palpitations and leg swelling.  Gastrointestinal: Negative for nausea, vomiting, abdominal pain, diarrhea, constipation, blood in stool and abdominal distention.  Genitourinary: Negative for urgency, hematuria, flank  pain, discharge, difficulty urinating and genital sores.  Musculoskeletal: Negative for myalgias, back pain, joint swelling, arthralgias, gait problem and neck stiffness.  Skin: Negative for rash.  Neurological: Positive for dizziness and light-headedness. Negative for syncope, speech difficulty, weakness, numbness and headaches.  Hematological: Negative for adenopathy. Does not bruise/bleed easily.  Psychiatric/Behavioral: Negative for behavioral problems and dysphoric mood. The patient is not nervous/anxious.        Objective:   Physical Exam  Constitutional: He is oriented to person, place, and time. He appears well-developed.  Repeat blood pressure 110/64  HENT:  Head: Normocephalic.  Right Ear: External ear normal.  Left Ear: External ear normal.  Eyes: Conjunctivae and EOM are normal.  Neck: Normal range of motion.  Cardiovascular: Normal rate, normal heart sounds and intact distal pulses.   Pulmonary/Chest: Breath sounds normal.  Abdominal: Bowel sounds are normal.  Musculoskeletal: Normal range of motion. He exhibits no edema or tenderness.  Neurological: He is alert and oriented to person, place, and time.  Psychiatric: He has a normal mood and affect. His behavior is normal.          Assessment & Plan:   Diabetes, kidney by peripheral neuropathy.  Will check a hemoglobin A1c Parkinson's disease.  Follow-up neurology Dyslipidemia.  Continue statin therapy Osteoarthritis Hypothyroidism  Recheck 6 months for annual exam

## 2014-06-16 NOTE — Progress Notes (Signed)
Pre visit review using our clinic review tool, if applicable. No additional management support is needed unless otherwise documented below in the visit note. 

## 2014-06-16 NOTE — Patient Instructions (Signed)
Return in 6 months for an annual exam    It is important that you exercise regularly, at least 20 minutes 3 to 4 times per week.  If you develop chest pain or shortness of breath seek  medical attention.  Follow-up neurology as scheduled

## 2014-06-18 ENCOUNTER — Encounter: Payer: Self-pay | Admitting: Internal Medicine

## 2014-07-12 DIAGNOSIS — L570 Actinic keratosis: Secondary | ICD-10-CM | POA: Diagnosis not present

## 2014-07-12 DIAGNOSIS — Z85828 Personal history of other malignant neoplasm of skin: Secondary | ICD-10-CM | POA: Diagnosis not present

## 2014-07-12 DIAGNOSIS — L821 Other seborrheic keratosis: Secondary | ICD-10-CM | POA: Diagnosis not present

## 2014-07-12 DIAGNOSIS — D1801 Hemangioma of skin and subcutaneous tissue: Secondary | ICD-10-CM | POA: Diagnosis not present

## 2014-07-12 DIAGNOSIS — L853 Xerosis cutis: Secondary | ICD-10-CM | POA: Diagnosis not present

## 2014-07-16 ENCOUNTER — Encounter: Payer: Self-pay | Admitting: Cardiovascular Disease

## 2014-08-16 ENCOUNTER — Encounter: Payer: Self-pay | Admitting: Cardiovascular Disease

## 2014-08-16 ENCOUNTER — Ambulatory Visit (INDEPENDENT_AMBULATORY_CARE_PROVIDER_SITE_OTHER): Payer: Medicare Other | Admitting: Cardiovascular Disease

## 2014-08-16 VITALS — BP 100/56 | HR 65 | Ht 69.0 in | Wt 178.3 lb

## 2014-08-16 DIAGNOSIS — I495 Sick sinus syndrome: Secondary | ICD-10-CM

## 2014-08-16 DIAGNOSIS — G908 Other disorders of autonomic nervous system: Secondary | ICD-10-CM

## 2014-08-16 DIAGNOSIS — G909 Disorder of the autonomic nervous system, unspecified: Secondary | ICD-10-CM

## 2014-08-16 DIAGNOSIS — I6521 Occlusion and stenosis of right carotid artery: Secondary | ICD-10-CM

## 2014-08-16 DIAGNOSIS — R001 Bradycardia, unspecified: Secondary | ICD-10-CM | POA: Diagnosis not present

## 2014-08-16 LAB — CUP PACEART INCLINIC DEVICE CHECK
Battery Remaining Longevity: 15
Brady Statistic RA Percent Paced: 28 %
Brady Statistic RV Percent Paced: 1 % — CL
Date Time Interrogation Session: 20160524040000
Lead Channel Impedance Value: 571 Ohm
Lead Channel Impedance Value: 581 Ohm
Lead Channel Pacing Threshold Amplitude: 0.7 V
Lead Channel Pacing Threshold Amplitude: 1.6 V
Lead Channel Pacing Threshold Pulse Width: 0.4 ms
Lead Channel Pacing Threshold Pulse Width: 1 ms
Lead Channel Sensing Intrinsic Amplitude: 6 mV
Lead Channel Sensing Intrinsic Amplitude: 7 mV
Lead Channel Setting Pacing Amplitude: 2 V
Lead Channel Setting Pacing Amplitude: 3.2 V
Lead Channel Setting Pacing Pulse Width: 1 ms
Lead Channel Setting Sensing Sensitivity: 2.5 mV
Pulse Gen Serial Number: 702951
Zone Setting Detection Interval: 353 ms

## 2014-08-16 NOTE — Progress Notes (Signed)
Patient ID: Stephen Robbins, male   DOB: [birth year], 79 y.o.   MRN: [id number]     Cardiology Office Note   Date:  08/16/2014   ID:  Stephen Robbins, DOB June 28, 1935, MRN [id number]  PCP:  Nyoka Cowden, MD  Cardiologist:   Sanda Klein, MD   Chief Complaint  Patient presents with  . Follow-up    lightheaded with some excersice,no pain or swelling of legs.questions about pace maker and some fatigue      History of Present Illness: Stephen Robbins is a 79 y.o. male who presents for pacemaker follow-up, roughly 6 months following implantation of a dual-chamber permanent pacemaker for symptomatic sinus bradycardia and prominent chronotropic incompetence.  He still complains of fatigue, without any improvement since his last pacemaker setting changes. His exercise now consist primarily of a recumbent bike and treadmill, holding onto the front bar. He no longer uses an elliptical and does not exercise outside. His heart rate histograms remain blunted with a maximum heart rate of 90 bpm, that is rarely achieved. There is only 28% atrial pacing.  Continues to experience mild orthostatic dizziness related to neuropathy.  Presenting rhythm today was alternating atrial paced and sinus beats with sensed ventricular beats. No ventricular pacing. He has not had any tachyarrhythmia.    Past Medical History  Diagnosis Date  . Hyperlipidemia   . Hemorrhoids   . Hypothyroidism   . Asymptomatic bilateral carotid artery stenosis     per duplex  05-15-2012  left >39%/   right 40-59%  . Bradycardia   . Fatigue   . Dizzy   . BPH (benign prostatic hypertrophy) with urinary obstruction   . NSVT (nonsustained ventricular tachycardia)     cardiologist-  dr Lilia Letterman  . Chronotropic incompetence with sinus node dysfunction   . Borderline diabetes   . GERD (gastroesophageal reflux disease)     occasional  . Compression of lumbar vertebra     L4 -- L5  . Compressed cervical disc   . Frequency of  urination   . Urgency of urination   . Nocturia   . Wears glasses   . Pacemaker   . Bronchial pneumonia 1958  . Dysmetabolic syndrome   . Arthritis     "joints; a little" (01/04/2014)  . Basal cell carcinoma     nose    Past Surgical History  Procedure Laterality Date  . Neuroplasty / transposition ulnar nerve at elbow Left 02-09-2010  . Closed reduction nasal fracture  09-01-2007  . Exercise tolerence test  12-03-2012  DR CROITURO    CHRONOTROPIC INCOMPETENCE/ NORMAL RESTING BP W/ APPROPRIATE RESPONSE/ NO CHEST PAIN/ NO ST CHANGES FROM BASELINE  . Laceration repair Right 1978    middle finger  . Tonsillectomy and adenoidectomy  1944  . Cystoscopy N/A 12/28/2012    Procedure: CYSTOSCOPY;  Surgeon: Bernestine Amass, MD;  Location: [facility];  Service: Urology;  Laterality: N/A;  . Transurethral incision of prostate N/A 12/28/2012    Procedure: TRANSURETHRAL INCISION OF THE PROSTATE (TUIP);  Surgeon: Bernestine Amass, MD;  Location[facility];  Service: Urology;  Laterality: N/A;  . Ulnar nerve transposition    . Insert / replace / remove pacemaker  01/04/2014    [employer] model L121 serial number [id number]  . Basal cell carcinoma excision      nose  . Permanent pacemaker insertion N/A 01/04/2014    Procedure: PERMANENT PACEMAKER INSERTION;  Surgeon: Sanda Klein, MD;  Location: [place] [facility];  Service: Cardiovascular;  Laterality: N/A;     Current Outpatient Prescriptions  Medication Sig Dispense Refill  . acetaminophen (TYLENOL) 325 MG tablet Take 1-2 tablets (325-650 mg total) by mouth every 4 (four) hours as needed for mild pain.    . Alpha-Lipoic Acid 600 MG CAPS Take 1 capsule by mouth daily.    Marland Kitchen ALPRAZolam (XANAX) 0.25 MG tablet Take 1 tablet (0.25 mg total) by mouth 2 (two) times daily as needed. 30 tablet 2  . aspirin 81 MG tablet Take 81 mg by mouth daily.    . B Complex Vitamins (B COMPLEX 50) TABS Take 1 tablet by mouth  every morning.     . carbidopa-levodopa (SINEMET IR) 25-100 MG per tablet Take 1 tablet by mouth 3 (three) times daily before meals. 270 tablet 1  . empagliflozin (JARDIANCE) 25 MG TABS tablet Take 25 mg by mouth daily. 90 tablet 1  . fish oil-omega-3 fatty acids 1000 MG capsule Take 2 g by mouth daily.    . folic acid (FOLVITE) 1 MG tablet Take 1 tablet (1 mg total) by mouth every morning. 90 tablet 1  . gabapentin (NEURONTIN) 300 MG capsule Take 1 capsule (300 mg total) by mouth 2 (two) times daily. Take one tablet at night for one week before increasing to BID 60 capsule 3  . hydrocortisone 0.5 % cream Apply 1 application topically 2 (two) times daily as needed. rash    . ibuprofen (ADVIL,MOTRIN) 200 MG tablet Take 200 mg by mouth daily as needed for mild pain.     Marland Kitchen levothyroxine (SYNTHROID, LEVOTHROID) 50 MCG tablet Take 1 tablet (50 mcg total) by mouth daily before breakfast. 90 tablet 3  . mirabegron ER (MYRBETRIQ) 25 MG TB24 tablet Take 25 mg by mouth daily.    Stephen Robbins Glycol-Propyl Glycol (SYSTANE ULTRA) 0.4-0.3 % SOLN Place 1 drop into both eyes daily as needed (dry eyes).     . sertraline (ZOLOFT) 25 MG tablet Take 25 mg by mouth every morning.    . simvastatin (ZOCOR) 20 MG tablet Take 1 tablet (20 mg total) by mouth every evening. 90 tablet 1  . tadalafil (CIALIS) 5 MG tablet Take 5 mg by mouth every morning.     . tamsulosin (FLOMAX) 0.4 MG CAPS capsule Take 1 capsule (0.4 mg total) by mouth every morning. 30 capsule 5  . vitamin B-12 (CYANOCOBALAMIN) 1000 MCG tablet Take 1,000 mcg by mouth every morning.      No current facility-administered medications for this visit.    Allergies:   Penicillins    Social History:  The patient  reports that he quit smoking about 46 years ago. His smoking use included Cigarettes. He has a 10 pack-year smoking history. He has never used smokeless tobacco. He reports that he drinks about 4.2 oz of alcohol per week. He reports that he does not  use illicit drugs.   Family History:  The patient's family history includes Lung cancer in his mother; Stroke in his father.    ROS:  Please see the history of present illness.    Otherwise, review of systems positive for peripheral neuropathy, orthostatic dizziness.   All other systems are reviewed and negative.    PHYSICAL EXAM: VS:  BP 100/56 mmHg  Pulse 65  Ht 5\' 9"  (1.753 m)  Wt 80.876 kg (178 lb 4.8 oz)  BMI 26.32 kg/m2 , BMI Body mass index is 26.32 kg/(m^2).  General: Alert, oriented x3, no  distress Head: no evidence of trauma, PERRL, EOMI, no exophtalmos or lid lag, no myxedema, no xanthelasma; normal ears, nose and oropharynx Neck: normal jugular venous pulsations and no hepatojugular reflux; brisk carotid pulses without delay and no carotid bruits Chest: clear to auscultation, no signs of consolidation by percussion or palpation, normal fremitus, symmetrical and full respiratory excursions, healthy pacemaker site Cardiovascular: normal position and quality of the apical impulse, regular rhythm, normal first and second heart sounds, no murmurs, rubs or gallops Abdomen: no tenderness or distention, no masses by palpation, no abnormal pulsatility or arterial bruits, normal bowel sounds, no hepatosplenomegaly Extremities: no clubbing, cyanosis or edema; 2+ radial, ulnar and brachial pulses bilaterally; 2+ right femoral, posterior tibial and dorsalis pedis pulses; 2+ left femoral, posterior tibial and dorsalis pedis pulses; no subclavian or femoral bruits Neurological: grossly nonfocal Psych: euthymic mood, full affect   EKG:  EKG is ordered today. The ekg ordered today demonstrates atrial paced and sinus beats with ventricular sensed rhythm   Recent Labs: 06/16/2014: ALT 9; BUN 13; Creatinine 0.73; Hemoglobin 15.8; Platelets 157.0; Potassium 4.1; Sodium 136; TSH 2.80    Lipid Panel    Component Value Date/Time   CHOL 117 02/27/2011 1038   TRIG 126.0 02/27/2011 1038    TRIG 83 02/20/2006 0838   HDL 29.70* 02/27/2011 1038   CHOLHDL 4 02/27/2011 1038   CHOLHDL 3.4 CALC 02/20/2006 0838   VLDL 25.2 02/27/2011 1038   LDLCALC 62 02/27/2011 1038      Wt Readings from Last 3 Encounters:  08/16/14 80.876 kg (178 lb 4.8 oz)  06/16/14 81.194 kg (179 lb)  05/23/14 78.926 kg (174 lb)      ASSESSMENT AND PLAN:  Mr. Ploeger continues to have prominent fatigue related to chronotropic incompetence and the current pacemaker settings have really not provided any relief. He has infrequent atrial pacing and his heart rate never exceeded 90 bpm even with activity. I think the accelerometer is not helping since the type of activity he engages in does not really involve any motion of his upper body. We'll turn the accelerometer often use minute ventilation sensor only. We increased minute ventilation sensitivity.  We walked him around the office of the current settings and he did not have inappropriate tachycardia or palpitations.   Current medicines are reviewed at length with the patient today.  The patient does not have concerns regarding medicines.  The following changes have been made:  no change  Labs/ tests ordered today include:  Orders Placed This Encounter  Procedures  . Implantable device check   Patient Instructions  Dr. Sallyanne Kuster recommends that you schedule a follow-up appointment in: 3 months with pacemaker check.      Mikael Spray, MD  08/16/2014 5:52 PM    Sanda Klein, MD, [facility] [phone] office [phone] pager

## 2014-08-16 NOTE — Patient Instructions (Signed)
Dr. Sallyanne Kuster recommends that you schedule a follow-up appointment in: 3 months with pacemaker check.

## 2014-08-17 NOTE — Addendum Note (Signed)
Addended by: Alma Downs on: 08/17/2014 03:07 PM   Modules accepted: Orders

## 2014-08-20 ENCOUNTER — Encounter: Payer: Self-pay | Admitting: Cardiovascular Disease

## 2014-08-25 ENCOUNTER — Telehealth: Payer: Self-pay

## 2014-08-25 ENCOUNTER — Encounter: Payer: Self-pay | Admitting: Internal Medicine

## 2014-08-25 NOTE — Telephone Encounter (Signed)
Please see message and advise if okay to refill Axiron.

## 2014-08-25 NOTE — Telephone Encounter (Signed)
[place], [place]: AXIRON 30 MG/ACT SOLN

## 2014-08-26 ENCOUNTER — Ambulatory Visit: Payer: BLUE CROSS/BLUE SHIELD | Admitting: Neurology

## 2014-08-26 MED ORDER — TESTOSTERONE 30 MG/ACT TD SOLN: TRANSDERMAL | Status: DC

## 2014-08-26 NOTE — Telephone Encounter (Signed)
Rx faxed to [facility]. 

## 2014-08-29 ENCOUNTER — Ambulatory Visit: Payer: BLUE CROSS/BLUE SHIELD | Admitting: Neurology

## 2014-08-30 ENCOUNTER — Ambulatory Visit: Payer: BLUE CROSS/BLUE SHIELD | Admitting: Neurology

## 2014-09-02 ENCOUNTER — Ambulatory Visit: Payer: BLUE CROSS/BLUE SHIELD | Admitting: Neurology

## 2014-09-06 ENCOUNTER — Encounter: Payer: Self-pay | Admitting: Cardiovascular Disease

## 2014-09-06 DIAGNOSIS — E291 Testicular hypofunction: Secondary | ICD-10-CM | POA: Diagnosis not present

## 2014-09-08 ENCOUNTER — Ambulatory Visit (INDEPENDENT_AMBULATORY_CARE_PROVIDER_SITE_OTHER): Payer: Medicare Other | Admitting: Neurology

## 2014-09-08 ENCOUNTER — Encounter: Payer: Self-pay | Admitting: Neurology

## 2014-09-08 VITALS — BP 100/60 | HR 66 | Ht 69.0 in | Wt 177.9 lb

## 2014-09-08 DIAGNOSIS — G20 Parkinson's disease: Secondary | ICD-10-CM | POA: Diagnosis not present

## 2014-09-08 DIAGNOSIS — E1342 Other specified diabetes mellitus with diabetic polyneuropathy: Secondary | ICD-10-CM | POA: Diagnosis not present

## 2014-09-08 DIAGNOSIS — G629 Polyneuropathy, unspecified: Secondary | ICD-10-CM

## 2014-09-08 DIAGNOSIS — I6521 Occlusion and stenosis of right carotid artery: Secondary | ICD-10-CM | POA: Diagnosis not present

## 2014-09-08 DIAGNOSIS — E1142 Type 2 diabetes mellitus with diabetic polyneuropathy: Secondary | ICD-10-CM

## 2014-09-08 NOTE — Progress Notes (Signed)
Stephen Robbins was seen today in f/u in the movement clinic.  He was dx with PD last visit, on 06/17/13.  He is on carbidopa/levodopa 25/100 tid.  He has no side effects with the carbidopa/levodopa, but states that "I just don't know what to expect."  He continues to have tremor, which can be bothersome for him.  He has had no falls.  No lightheadedness.  No near syncope.  No hallucinations.  He had a carotid ultrasound done since last visit.  There is 1-39% stenosis bilaterally.  He did have some lab work done since last visit.  His hemoglobin A1c has greatly improved.  It is 6.3 (was 7.9 and prior to that was 9.4). He just finished PT and is enrolled in the exercise class now.  He presents today with a long list of questions.  He again tape records our session.    11/19/13 update:  Pt is f/u regarding Parkinson's disease.  He is currently on carbidopa/levodopa 25/100, one tablet 3 times per day. He is accompanied by his wife, who supplements the history.   Patient denies any falls.  He denies hallucinations.  Denies nausea or vomiting.  No lightheadedness or near syncope.  He is exercising.  He still has tremor and isn't sure that the levodopa helps tremor.  He sent me several e-mails since last visit.  One of these was just a TV news link regarding [facility] high-resolution imaging and deep brain stimulation.  He subsequently sent another e-mail regarding focused ultrasound.  Asks about updates regarding these and has 2 pages of questions.  Asks about why we can't give him human growth hormone or steroids for PD.  Overall, patient is clinically doing well.   02/21/14 update:   Pt returns for f/u, accompanied by his wife who supplements the history.  The records that were made available to me were reviewed.  Pt has had PPM since last visit due to bradycardia.  He has been told not to exercise until 12/9.  He will start with [facility] moves classes after the first of the year as well as circuit 2 classes.  He is still  on carbidopa/levodopa 25/100 at 8-9am/12-1pm/6-7 pm.  He is c/o paresthesias/"neuropathy" in the R foot.  He only notices it with driving.   He has no back pain.  He has no sensation of balance loss with closing eyes in the shower.  He thinks that exposure to levodopa causes the neuropathy and brings with him a small study (58 pts) that suggests that levodopa could be an etiology for the neuropathy.  Notices lack of smell.  No falls.  Rare lightheadedness. No hallucinations.    05/23/14 update:  Pt is f/u today, accompanied by his wife who supplements the history.  [facility] and I reviewed those records.  He was told he had PN (known diagnosis) but no new meds were prescribed.  He asks about a "cure" for PN that a doctor in [place] was advertising.   He remains on carbidopa/levodopa 25/100 tid for the PD.  He is exercising faithfully.  He has trouble getting himself over 3 miles per hour when walking and he is concerned about that.  He asks again about taking steroids and human growth hormone.  He also asks about taking protein supplements.  He has d/c the glucosamine for joint pain/arthritis as he didn't think that it was helpful.    09/08/14 update:  The patient  is following up today.  He is accompanied by his wife who supplements the history.   Records were reviewed since last visit, from his primary care physician, podiatrist, as well as his cardiologist.  He remains on carbidopa/levodopa 25/100, one tablet 3 times per day.  He notices no wearing off.  He is doing PD circuit class.  He called his podiatrist since last visit and asked him about medications for neuropathy and he deferred to me.  We started gabapentin on May 30, 2014.  He is on 300 mg twice a day.  He states that his feet numbness is better.  He has continued to complain about fatigue.  His pacemaker was recently adjusted.  He is trying to walk/exercise at the park and his endurance has been better ever  since.  He again asks about energy.  He is back on testosterone for that.    No neuroimaging since CT brain in 2009.  PREVIOUS MEDICATIONS: none to date  ALLERGIES:   Allergies  Allergen Reactions  . Penicillins Other (See Comments)    Unknown childhood reaction    CURRENT MEDICATIONS:  Current Outpatient Prescriptions on File Prior to Visit  Medication Sig Dispense Refill  . acetaminophen (TYLENOL) 325 MG tablet Take 1-2 tablets (325-650 mg total) by mouth every 4 (four) hours as needed for mild pain.    . Alpha-Lipoic Acid 600 MG CAPS Take 1 capsule by mouth daily.    Marland Kitchen ALPRAZolam (XANAX) 0.25 MG tablet Take 1 tablet (0.25 mg total) by mouth 2 (two) times daily as needed. 30 tablet 2  . aspirin 81 MG tablet Take 81 mg by mouth daily.    . B Complex Vitamins (B COMPLEX 50) TABS Take 1 tablet by mouth every morning.     . carbidopa-levodopa (SINEMET IR) 25-100 MG per tablet Take 1 tablet by mouth 3 (three) times daily before meals. 270 tablet 1  . empagliflozin (JARDIANCE) 25 MG TABS tablet Take 25 mg by mouth daily. 90 tablet 1  . fish oil-omega-3 fatty acids 1000 MG capsule Take 2 g by mouth daily.    . folic acid (FOLVITE) 1 MG tablet Take 1 tablet (1 mg total) by mouth every morning. 90 tablet 1  . gabapentin (NEURONTIN) 300 MG capsule Take 1 capsule (300 mg total) by mouth 2 (two) times daily. Take one tablet at night for one week before increasing to BID 60 capsule 3  . hydrocortisone 0.5 % cream Apply 1 application topically 2 (two) times daily as needed. rash    . ibuprofen (ADVIL,MOTRIN) 200 MG tablet Take 200 mg by mouth daily as needed for mild pain.     Marland Kitchen levothyroxine (SYNTHROID, LEVOTHROID) 50 MCG tablet Take 1 tablet (50 mcg total) by mouth daily before breakfast. 90 tablet 3  . mirabegron ER (MYRBETRIQ) 25 MG TB24 tablet Take 25 mg by mouth daily.    Vladimir Faster Glycol-Propyl Glycol (SYSTANE ULTRA) 0.4-0.3 % SOLN Place 1 drop into both eyes daily as needed (dry eyes).      . sertraline (ZOLOFT) 25 MG tablet Take 25 mg by mouth every morning.    . simvastatin (ZOCOR) 20 MG tablet Take 1 tablet (20 mg total) by mouth every evening. 90 tablet 1  . tadalafil (CIALIS) 5 MG tablet Take 5 mg by mouth every morning.     . tamsulosin (FLOMAX) 0.4 MG CAPS capsule Take 1 capsule (0.4 mg total) by mouth every morning. 30 capsule 5  . Testosterone (AXIRON) 30 MG/ACT  SOLN PLACE 2 APPLICATORFULS ONTO THE SKIN ONCE DAILY 90 mL 1  . vitamin B-12 (CYANOCOBALAMIN) 1000 MCG tablet Take 1,000 mcg by mouth every morning.      No current facility-administered medications on file prior to visit.    PAST MEDICAL HISTORY:   Past Medical History  Diagnosis Date  . Hyperlipidemia   . Hemorrhoids   . Hypothyroidism   . Asymptomatic bilateral carotid artery stenosis     per duplex  05-15-2012  left >39%/   right 40-59%  . Bradycardia   . Fatigue   . Dizzy   . BPH (benign prostatic hypertrophy) with urinary obstruction   . NSVT (nonsustained ventricular tachycardia)     cardiologist-  dr croitoru  . Chronotropic incompetence with sinus node dysfunction   . Borderline diabetes   . GERD (gastroesophageal reflux disease)     occasional  . Compression of lumbar vertebra     L4 -- L5  . Compressed cervical disc   . Frequency of urination   . Urgency of urination   . Nocturia   . Wears glasses   . Pacemaker   . Bronchial pneumonia 1958  . Dysmetabolic syndrome   . Arthritis     "joints; a little" (01/04/2014)  . Basal cell carcinoma     nose    PAST SURGICAL HISTORY:   Past Surgical History  Procedure Laterality Date  . Neuroplasty / transposition ulnar nerve at elbow Left 02-09-2010  . Closed reduction nasal fracture  09-01-2007  . Exercise tolerence test  12-03-2012  DR CROITURO    CHRONOTROPIC INCOMPETENCE/ NORMAL RESTING BP W/ APPROPRIATE RESPONSE/ NO CHEST PAIN/ NO ST CHANGES FROM BASELINE  . Laceration repair Right 1978    middle finger  . Tonsillectomy and  adenoidectomy  1944  . Cystoscopy N/A 12/28/2012    Procedure: CYSTOSCOPY;  Surgeon: Bernestine Amass, MD;  Location[facility];  Service: Urology;  Laterality: N/A;  . Transurethral incision of prostate N/A 12/28/2012    Procedure: TRANSURETHRAL INCISION OF THE PROSTATE (TUIP);  Surgeon: Bernestine Amass, MD;  Location: [facility];  Service: Urology;  Laterality: N/A;  . Ulnar nerve transposition    . Insert / replace / remove pacemaker  01/04/2014    [employer] model L121 serial number [id number]  . Basal cell carcinoma excision      nose  . Permanent pacemaker insertion N/A 01/04/2014    Procedure: PERMANENT PACEMAKER INSERTION;  Surgeon: Sanda Klein, MD;  Location: [facility];  Service: Cardiovascular;  Laterality: N/A;    SOCIAL HISTORY:   History   Social History  . Marital Status: Married    Spouse Name: N/A  . Number of Children: 0  . Years of Education: N/A   Occupational History  . retired     Programme researcher, broadcasting/film/video    Social History Main Topics  . Smoking status: Former Smoker -- 1.00 packs/day for 10 years    Types: Cigarettes    Quit date: 03/25/1968  . Smokeless tobacco: Never Used  . Alcohol Use: 4.2 oz/week    7 Glasses of wine per week     Comment: 1 glass wine daily  . Drug Use: No  . Sexual Activity: Not Currently   Other Topics Concern  . Not on file   Social History Narrative   Regular exercise          FAMILY HISTORY:   Family Status  Relation Status Death Age  .  Father Deceased     stroke  . Mother Deceased     lung cancer  . Brother Alive     healthy    ROS:    A complete 10 system review of systems was obtained and was unremarkable apart from what is mentioned above.  PHYSICAL EXAMINATION:    VITALS:   Filed Vitals:   09/08/14 1527  BP: 100/60  Pulse: 66  Height: 5\' 9"  (1.753 m)  Weight: 177 lb 14.4 oz (80.695 kg)   My medical assistant was present for the entire history/PE.  GEN:  The  patient appears stated age and is in NAD. HEENT:  /AT.  MMM CV:  RRR Lungs:  CTAB Neck:  No bruits   Neurological examination:  Orientation: The patient is alert and oriented x3. Fund of knowledge is appropriate.  Recent and remote memory are intact.  Cranial nerves: There is good facial symmetry. Mild facial hypomimia.  Pupils are equal round and reactive to light bilaterally. Fundoscopic exam reveals clear margins bilaterally. Extraocular muscles are intact. The visual fields are full to confrontational testing. The speech is fluent and clear.  Soft palate rises symmetrically and there is no tongue deviation. Hearing is intact to conversational tone. Motor: Strength is 5/5 in the bilateral upper and lower extremities.   Shoulder shrug is equal and symmetric.  There is no pronator drift. Sensation:  Intact to light touch.  More sensitive aspects not tested today  Movement examination: Tone: There is mild rigidity in the LUE Abnormal movements: There is mild tremor that can be felt in the LUE Coordination:  There are normal RAM's in the UE/LE today. Gait and Station: The patient has no difficulty arising out of a deep-seated chair without the use of the hands. The patient's stride length is fairly normal with good arm swing.  The patient has a negative pull test.      LABS:  Lab Results  Component Value Date   TSH 2.80 06/16/2014   Lab Results  Component Value Date   VITAMINB12 872 06/17/2013     Chemistry      Component Value Date/Time   NA 136 06/16/2014 1351   K 4.1 06/16/2014 1351   CL 102 06/16/2014 1351   CO2 30 06/16/2014 1351   BUN 13 06/16/2014 1351   CREATININE 0.73 06/16/2014 1351   CREATININE 0.77 12/29/2013 1627      Component Value Date/Time   CALCIUM 9.4 06/16/2014 1351   ALKPHOS 41 06/16/2014 1351   AST 32 06/16/2014 1351   ALT 9 06/16/2014 1351   BILITOT 1.0 06/16/2014 1351     RPR negative Lab Results  Component Value Date   FOLATE >20.0  06/17/2013     Lab Results  Component Value Date   HGBA1C 5.9 06/16/2014    ASSESSMENT/PLAN:  1. idiopathic Parkinson's disease.  I see no atypical features.    -We discussed the diagnosis as well as pathophysiology of the disease.  We discussed treatment options as well as prognostic indicators.  Patient education was provided.  We talked about the community boxing program that has just started and was given information on that.  -Greater than 50% of the 30 minute visit was again spent in counseling answering questions and talking about what to expect now as well as in the future.  We talked about medication options as well as potential future surgical options.   We talked about safety in the home.  We talked about nutrition.  -He  will continue the carbidopa/levodopa 25/100, one tablet 3 times daily.  I will refill this today.    -Pt asked again about protein effect on efficacy of levodopa and we reviewed that today.  2.  Peripheral neuropathy  -There is evidence of this on examination.  Likely from DM.  His workup for reversible causes was negative.  His diabetes is under much better control than in the past.  -explained to the patient that I don't think that the levodopa is the primary contributor (or even a contributor at all) to peripheral neuropathy as he read a small study about this.    -explained to the patient that there is no cure for PN, as he heard on an advertisement.  [facility].  They deferred here for tx and was started on gabapentin 300 mg bid in 05/2014.  Seems to be helping without SE and we will continue. 3.  Hx of transaminasemia  -These were elevated in 2012 but have improved 4.  R carotid bruit with hx of carotid stenosis  -04/2012 u/s with 40-50% stenosis on the R.  this was rechecked in 2015 and was 1-39% bilaterally. 5.  Return in about 4 months (around 01/08/2015).

## 2014-09-08 NOTE — Patient Instructions (Signed)
1. Continue Carbidopa Levodopa 25/100 - take medication at least 30 minutes prior to meals.

## 2014-09-13 ENCOUNTER — Telehealth: Payer: Self-pay | Admitting: Internal Medicine

## 2014-09-13 MED ORDER — EMPAGLIFLOZIN 25 MG PO TABS: 25.0000 mg | ORAL_TABLET | Freq: Every day | ORAL | Status: DC

## 2014-09-13 NOTE — Telephone Encounter (Signed)
Rx sent to [facility]. 

## 2014-09-13 NOTE — Telephone Encounter (Signed)
Pt request refill of the following: empagliflozin (JARDIANCE) 25 MG TABS tablet   Phamacy: [facility]

## 2014-09-23 DIAGNOSIS — E291 Testicular hypofunction: Secondary | ICD-10-CM | POA: Diagnosis not present

## 2014-09-23 DIAGNOSIS — R3915 Urgency of urination: Secondary | ICD-10-CM | POA: Diagnosis not present

## 2014-09-23 DIAGNOSIS — R81 Glycosuria: Secondary | ICD-10-CM | POA: Diagnosis not present

## 2014-09-24 ENCOUNTER — Other Ambulatory Visit: Payer: Self-pay | Admitting: Internal Medicine

## 2014-10-18 ENCOUNTER — Ambulatory Visit: Payer: Medicare Other | Admitting: Speech Pathology

## 2014-10-18 ENCOUNTER — Ambulatory Visit: Payer: Medicare Other | Admitting: Occupational Therapy

## 2014-10-18 ENCOUNTER — Ambulatory Visit: Payer: Medicare Other | Attending: Neurology | Admitting: Physical Therapy

## 2014-10-18 DIAGNOSIS — R258 Other abnormal involuntary movements: Secondary | ICD-10-CM

## 2014-10-18 DIAGNOSIS — G20 Parkinson's disease: Secondary | ICD-10-CM

## 2014-10-18 DIAGNOSIS — R269 Unspecified abnormalities of gait and mobility: Secondary | ICD-10-CM

## 2014-10-18 DIAGNOSIS — R490 Dysphonia: Secondary | ICD-10-CM

## 2014-10-18 NOTE — Therapy (Signed)
[facility] [address] [place], [name], [address] Phone: [phone]   Fax:  [phone]  Patient Details  Name: Stephen Robbins MRN: [id number] Date of Birth: [birth year] Referring Provider:  Marletta Lor, MD   Speech Therapy Parkinson's Disease Screen  Date: 10/18/14    Decibel Level today: 70dB  (WNL=70-72 dB) with sound level meter 30cm away from pt's mouth Pt's conversational volume has not changed significantly. Pt denies difficulty communicating or having to repeat himself more frequently.  Pt has not experienced difficulty in swallowing warranting objective evaluation  Pt would benefit from the following speech therapy recommendations:   Pt does does not require speech therapy services at this time. Recommend ST screen in another  6 months    .  Encounter Date: 10/18/2014   Iline Oven MS, CCC-SLP 10/18/2014, 1:26 PM  [facility] [address] [place], [name], [address] Phone: [phone]   Fax:  [phone]

## 2014-10-18 NOTE — Therapy (Signed)
[facility] [address] [place], [name], [address] Phone: [phone]   Fax:  [phone]  Patient Details  Name: Stephen Robbins MRN: [id number] Date of Birth: [birth year] Referring Provider:  Marletta Lor, MD  Encounter Date: 10/18/2014  Physical Therapy Parkinson's Disease Screen   Timed Up and Go test:12.86 sec  10 meter walk test:11.16 sec = 2.94 ft/sec  5 time sit to stand test:11.56 sec    Patient does not require Physical Therapy services at this time.  Recommend Physical Therapy screen in 6 months in conjunction with OT and speech.  Discussed slight slowing in TUG and 5x sit<>stand scores.  Pt does not feel he notices any slowing of functional mobility; therefore will monitor at community exercise class and will screen again in 6 months.    [name] W. 10/18/2014, 1:49 PM  [name]., PT  [facility] [address] [place], [name], [address] Phone: [phone]   Fax:  [phone]

## 2014-10-18 NOTE — Therapy (Signed)
[facility] [address] [place], [name], [address] Phone: [phone]   Fax:  [phone]  Patient Details  Name: Stephen Robbins MRN: [id number] Date of Birth: 1935/12/29 Referring Provider:  Marletta Lor, MD  Encounter Date: 10/18/2014  Occupational Therapy Parkinson's Disease Screen  Physical Performance Test item #2 (simulated eating):  12.71sec  Physical Performance Test item #4 (donning/doffing jacket):  14.78sec  9-hole peg test:    RUE  34.75sec        LUE  31.32sec  Box & Blocks Test:   RUE  43 blocks        LUE 44 blocks    Change in ability to perform ADLs/IADLs:  None reported  Other Comments:  Pt instructed in possible fine motor/ADL changes to be aware of in the future.   Pt does not require occupation therapy services at this time.  Recommended occupational therapy screen in   approx 6 months   [facility] 10/18/2014, 1:17 PM  [facility] [address] [place], [name], [address] Phone: [phone]   Fax:  [place], OTR/L 10/18/2014 2:09 PM

## 2014-11-14 ENCOUNTER — Other Ambulatory Visit: Payer: Self-pay | Admitting: Neurology

## 2014-11-14 MED ORDER — CARBIDOPA-LEVODOPA 25-100 MG PO TABS: 1.0000 | ORAL_TABLET | Freq: Three times a day (TID) | ORAL | Status: DC

## 2014-11-14 NOTE — Telephone Encounter (Signed)
Carbidopa Levodopa 25/100 refill requested. Per last office note- patient to remain on medication. Refill approved and sent to patient's pharmacy.   

## 2014-11-22 ENCOUNTER — Encounter: Payer: Self-pay | Admitting: Cardiovascular Disease

## 2014-11-22 ENCOUNTER — Ambulatory Visit (INDEPENDENT_AMBULATORY_CARE_PROVIDER_SITE_OTHER): Payer: Medicare Other | Admitting: Cardiovascular Disease

## 2014-11-22 VITALS — BP 105/64 | HR 75 | Resp 16 | Ht 69.0 in | Wt 176.0 lb

## 2014-11-22 DIAGNOSIS — I495 Sick sinus syndrome: Secondary | ICD-10-CM

## 2014-11-22 DIAGNOSIS — I6521 Occlusion and stenosis of right carotid artery: Secondary | ICD-10-CM

## 2014-11-22 NOTE — Progress Notes (Signed)
Patient ID: Stephen Robbins, male   DOB: [birth year], 79 y.o.   MRN: [id number]      Cardiology Office Note   Date:  11/22/2014   ID:  Stephen Robbins, DOB January 07, 1936, MRN [id number]  PCP:  Nyoka Cowden, MD  Cardiologist:   Sanda Klein, MD   Chief Complaint  Patient presents with  . phys pacer check    Patient SOB, fatigue, light headed, and dizzy.      History of Present Illness: Stephen Robbins is a 79 y.o. male who presents for pacemaker follow-up. He still does not have the full improvement in exercise capacity that he expected from the device implantation. Device interrogation still shows that his heart rate histograms are quite blunted and he rarely if ever exceeds a heart rate of 100 bpm.  Otherwise his only complaint is dizziness after standing up suddenly, especially when he participates in his Parkinson's gymnastics sessions. It sounds like she is describing typical orthostatic hypotension.  Past Medical History  Diagnosis Date  . Hyperlipidemia   . Hemorrhoids   . Hypothyroidism   . Asymptomatic bilateral carotid artery stenosis     per duplex  05-15-2012  left >39%/   right 40-59%  . Bradycardia   . Fatigue   . Dizzy   . BPH (benign prostatic hypertrophy) with urinary obstruction   . NSVT (nonsustained ventricular tachycardia)     cardiologist-  dr Antwone Capozzoli  . Chronotropic incompetence with sinus node dysfunction   . Borderline diabetes   . GERD (gastroesophageal reflux disease)     occasional  . Compression of lumbar vertebra     L4 -- L5  . Compressed cervical disc   . Frequency of urination   . Urgency of urination   . Nocturia   . Wears glasses   . Pacemaker   . Bronchial pneumonia 1958  . Dysmetabolic syndrome   . Arthritis     "joints; a little" (01/04/2014)  . Basal cell carcinoma     nose    Past Surgical History  Procedure Laterality Date  . Neuroplasty / transposition ulnar nerve at elbow Left 02-09-2010  . Closed reduction nasal  fracture  09-01-2007  . Exercise tolerence test  12-03-2012  DR CROITURO    CHRONOTROPIC INCOMPETENCE/ NORMAL RESTING BP W/ APPROPRIATE RESPONSE/ NO CHEST PAIN/ NO ST CHANGES FROM BASELINE  . Laceration repair Right 1978    middle finger  . Tonsillectomy and adenoidectomy  1944  . Cystoscopy N/A 12/28/2012    Procedure: CYSTOSCOPY;  Surgeon: Bernestine Amass, MD;  Location: [facility];  Service: Urology;  Laterality: N/A;  . Transurethral incision of prostate N/A 12/28/2012    Procedure: TRANSURETHRAL INCISION OF THE PROSTATE (TUIP);  Surgeon: Bernestine Amass, MD;  Location: [facility];  Service: Urology;  Laterality: N/A;  . Ulnar nerve transposition    . Insert / replace / remove pacemaker  01/04/2014    [employer] model L121 serial number [id number]  . Basal cell carcinoma excision      nose  . Permanent pacemaker insertion N/A 01/04/2014    Procedure: PERMANENT PACEMAKER INSERTION;  Surgeon: Sanda Klein, MD;  Location: [place] [facility];  Service: Cardiovascular;  Laterality: N/A;     Current Outpatient Prescriptions  Medication Sig Dispense Refill  . acetaminophen (TYLENOL) 325 MG tablet Take 1-2 tablets (325-650 mg total) by mouth every 4 (four) hours as needed for mild pain.    . Alpha-Lipoic Acid 600 MG  CAPS Take 1 capsule by mouth daily.    Marland Kitchen ALPRAZolam (XANAX) 0.25 MG tablet Take 1 tablet (0.25 mg total) by mouth 2 (two) times daily as needed. (Patient taking differently: Take 0.25 mg by mouth daily. ) 30 tablet 2  . aspirin 81 MG tablet Take 81 mg by mouth daily.    . B Complex Vitamins (B COMPLEX 50) TABS Take 1 tablet by mouth every morning.     . carbidopa-levodopa (SINEMET IR) 25-100 MG per tablet Take 1 tablet by mouth 3 (three) times daily before meals. 270 tablet 1  . empagliflozin (JARDIANCE) 25 MG TABS tablet Take 25 mg by mouth daily. 90 tablet 1  . fish oil-omega-3 fatty acids 1000 MG capsule Take 2 g by mouth daily.    . folic  acid (FOLVITE) 1 MG tablet Take 1 tablet (1 mg total) by mouth every morning. 90 tablet 1  . gabapentin (NEURONTIN) 300 MG capsule Take 1 capsule (300 mg total) by mouth 2 (two) times daily. Take one tablet at night for one week before increasing to BID 60 capsule 3  . hydrocortisone 0.5 % cream Apply 1 application topically 2 (two) times daily as needed. rash    . ibuprofen (ADVIL,MOTRIN) 200 MG tablet Take 200 mg by mouth daily as needed for mild pain.     Marland Kitchen levothyroxine (SYNTHROID, LEVOTHROID) 50 MCG tablet Take 1 tablet (50 mcg total) by mouth daily before breakfast. 90 tablet 3  . mirabegron ER (MYRBETRIQ) 25 MG TB24 tablet Take 25 mg by mouth daily.    Vladimir Faster Glycol-Propyl Glycol (SYSTANE ULTRA) 0.4-0.3 % SOLN Place 1 drop into both eyes daily as needed (dry eyes).     . sertraline (ZOLOFT) 25 MG tablet Take 25 mg by mouth every morning.    . simvastatin (ZOCOR) 20 MG tablet Take 1 tablet (20 mg total) by mouth every evening. 90 tablet 1  . tadalafil (CIALIS) 5 MG tablet Take 5 mg by mouth every morning.     . tamsulosin (FLOMAX) 0.4 MG CAPS capsule TAKE 1 CAPSULE BY MOUTH EVERY MORNING 30 capsule 5  . Testosterone (AXIRON) 30 MG/ACT SOLN PLACE 2 APPLICATORFULS ONTO THE SKIN ONCE DAILY 90 mL 1  . vitamin B-12 (CYANOCOBALAMIN) 1000 MCG tablet Take 1,000 mcg by mouth every morning.      No current facility-administered medications for this visit.    Allergies:   Penicillins    Social History:  The patient  reports that he quit smoking about 46 years ago. His smoking use included Cigarettes. He has a 10 pack-year smoking history. He has never used smokeless tobacco. He reports that he drinks about 4.2 oz of alcohol per week. He reports that he does not use illicit drugs.   Family History:  The patient's family history includes Lung cancer in his mother; Stroke in his father.    ROS:  Please see the history of present illness.    Otherwise, review of systems positive for none.     All other systems are reviewed and negative.    PHYSICAL EXAM: VS:  BP 105/64 mmHg  Pulse 75  Resp 16  Ht 5\' 9"  (1.753 m)  Wt 176 lb (79.833 kg)  BMI 25.98 kg/m2 , BMI Body mass index is 25.98 kg/(m^2).  General: Alert, oriented x3, no distress Head: no evidence of trauma, PERRL, EOMI, no exophtalmos or lid lag, no myxedema, no xanthelasma; normal ears, nose and oropharynx Neck: normal jugular venous pulsations and no hepatojugular  reflux; brisk carotid pulses without delay and no carotid bruits Chest: clear to auscultation, no signs of consolidation by percussion or palpation, normal fremitus, symmetrical and full respiratory excursions, healthy left subclavian pacemaker site Cardiovascular: normal position and quality of the apical impulse, regular rhythm, normal first and second heart sounds, no murmurs, rubs or gallops Abdomen: no tenderness or distention, no masses by palpation, no abnormal pulsatility or arterial bruits, normal bowel sounds, no hepatosplenomegaly Extremities: no clubbing, cyanosis or edema; 2+ radial, ulnar and brachial pulses bilaterally; 2+ right femoral, posterior tibial and dorsalis pedis pulses; 2+ left femoral, posterior tibial and dorsalis pedis pulses; no subclavian or femoral bruits Neurological: grossly nonfocal Psych: euthymic mood, full affect   EKG:  EKG is not ordered today.  Recent Labs: 06/16/2014: ALT 9; BUN 13; Creatinine, Ser 0.73; Hemoglobin 15.8; Platelets 157.0; Potassium 4.1; Sodium 136; TSH 2.80    Lipid Panel    Component Value Date/Time   CHOL 117 02/27/2011 1038   TRIG 126.0 02/27/2011 1038   TRIG 83 02/20/2006 0838   HDL 29.70* 02/27/2011 1038   CHOLHDL 4 02/27/2011 1038   CHOLHDL 3.4 CALC 02/20/2006 0838   VLDL 25.2 02/27/2011 1038   LDLCALC 62 02/27/2011 1038      Wt Readings from Last 3 Encounters:  11/22/14 176 lb (79.833 kg)  09/08/14 177 lb 14.4 oz (80.695 kg)  08/16/14 178 lb 4.8 oz (80.876 kg)    ASSESSMENT AND  PLAN:  We'll have to personalize his sensor settings to get more optimal treatment of his chronotropic incompetence. We'll plan to have a [employer] accompany him at [employer].  His orthostatic hypotension could be a consequence of Parkinson's disease itself, treatment with Sinemet, treatment with alpha blockers for prostate disease or even sertraline. I would try temporary withholding his alpha-blocker for period of time to see if this improves.    Current medicines are reviewed at length with the patient today.  The patient does not have concerns regarding medicines.  The following changes have been made:  no change  Labs/ tests ordered today include:  No orders of the defined types were placed in this encounter.     Patient Instructions  Remote monitoring is used to monitor your Pacemaker from home. This monitoring reduces the number of office visits required to check your device to one time per year. It allows [place] to monitor the functioning of your device to ensure it is working properly. You are scheduled for a device check from home on February 22, 2015. You may send your transmission at any time that day. If you have a wireless device, the transmission will be sent automatically. After your physician reviews your transmission, you will receive a postcard with your next transmission date.  Dr. Sallyanne Kuster recommends that you schedule a follow-up appointment in: 6 MONTHS.        Mikael Spray, MD  11/22/2014 4:40 PM    Sanda Klein, MD, [facility] [phone] office [phone] pager

## 2014-11-22 NOTE — Patient Instructions (Signed)
Remote monitoring is used to monitor your Pacemaker from home. This monitoring reduces the number of office visits required to check your device to one time per year. It allows [place] to monitor the functioning of your device to ensure it is working properly. You are scheduled for a device check from home on February 22, 2015. You may send your transmission at any time that day. If you have a wireless device, the transmission will be sent automatically. After your physician reviews your transmission, you will receive a postcard with your next transmission date.  Dr. Sallyanne Kuster recommends that you schedule a follow-up appointment in: 6 MONTHS.

## 2014-11-23 LAB — CUP PACEART INCLINIC DEVICE CHECK
Battery Remaining Longevity: 15
Brady Statistic RA Percent Paced: 35 %
Brady Statistic RV Percent Paced: 2 %
Date Time Interrogation Session: 20160831105651
Lead Channel Impedance Value: 569 Ohm
Lead Channel Impedance Value: 612 Ohm
Lead Channel Pacing Threshold Amplitude: 0.7 V
Lead Channel Pacing Threshold Amplitude: 1.9 V
Lead Channel Pacing Threshold Pulse Width: 0.4 ms
Lead Channel Pacing Threshold Pulse Width: 1 ms
Lead Channel Sensing Intrinsic Amplitude: 8 mV
Lead Channel Sensing Intrinsic Amplitude: 8.6 mV
Lead Channel Setting Pacing Amplitude: 2 V
Lead Channel Setting Pacing Amplitude: 4 V
Lead Channel Setting Pacing Pulse Width: 1 ms
Lead Channel Setting Sensing Sensitivity: 2.5 mV
Pulse Gen Serial Number: 702951
Zone Setting Detection Interval: 353 ms

## 2014-11-29 ENCOUNTER — Encounter: Payer: Self-pay | Admitting: Internal Medicine

## 2014-11-29 ENCOUNTER — Ambulatory Visit (INDEPENDENT_AMBULATORY_CARE_PROVIDER_SITE_OTHER): Payer: Medicare Other | Admitting: Internal Medicine

## 2014-11-29 VITALS — BP 120/60 | HR 77 | Temp 97.7°F | Ht 67.0 in | Wt 173.0 lb

## 2014-11-29 DIAGNOSIS — Z950 Presence of cardiac pacemaker: Secondary | ICD-10-CM

## 2014-11-29 DIAGNOSIS — Z0000 Encounter for general adult medical examination without abnormal findings: Secondary | ICD-10-CM

## 2014-11-29 DIAGNOSIS — E1142 Type 2 diabetes mellitus with diabetic polyneuropathy: Secondary | ICD-10-CM

## 2014-11-29 DIAGNOSIS — Z23 Encounter for immunization: Secondary | ICD-10-CM | POA: Diagnosis not present

## 2014-11-29 DIAGNOSIS — I6521 Occlusion and stenosis of right carotid artery: Secondary | ICD-10-CM | POA: Diagnosis not present

## 2014-11-29 DIAGNOSIS — G629 Polyneuropathy, unspecified: Secondary | ICD-10-CM | POA: Diagnosis not present

## 2014-11-29 DIAGNOSIS — G20 Parkinson's disease: Secondary | ICD-10-CM | POA: Diagnosis not present

## 2014-11-29 DIAGNOSIS — E785 Hyperlipidemia, unspecified: Secondary | ICD-10-CM | POA: Diagnosis not present

## 2014-11-29 DIAGNOSIS — E291 Testicular hypofunction: Secondary | ICD-10-CM | POA: Diagnosis not present

## 2014-11-29 DIAGNOSIS — E1342 Other specified diabetes mellitus with diabetic polyneuropathy: Secondary | ICD-10-CM

## 2014-11-29 DIAGNOSIS — E349 Endocrine disorder, unspecified: Secondary | ICD-10-CM

## 2014-11-29 LAB — LIPID PANEL
Cholesterol: 116 mg/dL (ref 0–200)
HDL: 35.6 mg/dL — ABNORMAL LOW (ref >39.00)
LDL Cholesterol: 64 mg/dL (ref 0–99)
NonHDL: 80.7
Total CHOL/HDL Ratio: 3
Triglycerides: 83 mg/dL (ref 0.0–149.0)
VLDL: 16.6 mg/dL (ref 0.0–40.0)

## 2014-11-29 LAB — TESTOSTERONE: Testosterone: 569 ng/dL (ref 300.00–890.00)

## 2014-11-29 LAB — HEMOGLOBIN A1C: Hgb A1c MFr Bld: 6 % (ref 4.6–6.5)

## 2014-11-29 NOTE — Progress Notes (Signed)
Subjective:    Patient ID: Stephen Robbins, male    DOB: 12-23-1935, 79 y.o.   MRN: [id number]  HPI  79 year-old patient who is in today for comprehensive annual preventive visit.  He has a history of diabetes, which has been  Complicated by peripheral neuropathy.  He is followed closely by neurology for PD.    He has osteoarthritis and low back pain.  He  Has hypothyroidism and dyslipidemia.  He has also been seen by cardiology.  He is status post pacemaker insertion for sick sinus syndrome. Medical records reviewed He has type 2 diabetes Hemoglobin A1c s since starting Invokana have  been very well controlled  Lab Results  Component Value Date   HGBA1C 5.9 06/16/2014    Past Medical History  Diagnosis Date  . Hyperlipidemia   . Hemorrhoids   . Hypothyroidism   . Asymptomatic bilateral carotid artery stenosis     per duplex  05-15-2012  left >39%/   right 40-59%  . Bradycardia   . Fatigue   . Dizzy   . BPH (benign prostatic hypertrophy) with urinary obstruction   . NSVT (nonsustained ventricular tachycardia)     cardiologist-  dr croitoru  . Chronotropic incompetence with sinus node dysfunction   . Borderline diabetes   . GERD (gastroesophageal reflux disease)     occasional  . Compression of lumbar vertebra     L4 -- L5  . Compressed cervical disc   . Frequency of urination   . Urgency of urination   . Nocturia   . Wears glasses   . Pacemaker   . Bronchial pneumonia 1958  . Dysmetabolic syndrome   . Arthritis     "joints; a little" (01/04/2014)  . Basal cell carcinoma     nose    Social History   Social History  . Marital Status: Married    Spouse Name: N/A  . Number of Children: 0  . Years of Education: N/A   Occupational History  . retired     Programme researcher, broadcasting/film/video    Social History Main Topics  . Smoking status: Former Smoker -- 1.00 packs/day for 10 years    Types: Cigarettes    Quit date: 03/25/1968  . Smokeless tobacco: Never Used  . Alcohol Use: 4.2  oz/week    7 Glasses of wine per week     Comment: 1 glass wine daily  . Drug Use: No  . Sexual Activity: Not Currently   Other Topics Concern  . Not on file   Social History Narrative   Regular exercise          Past Surgical History  Procedure Laterality Date  . Neuroplasty / transposition ulnar nerve at elbow Left 02-09-2010  . Closed reduction nasal fracture  09-01-2007  . Exercise tolerence test  12-03-2012  DR CROITURO    CHRONOTROPIC INCOMPETENCE/ NORMAL RESTING BP W/ APPROPRIATE RESPONSE/ NO CHEST PAIN/ NO ST CHANGES FROM BASELINE  . Laceration repair Right 1978    middle finger  . Tonsillectomy and adenoidectomy  1944  . Cystoscopy N/A 12/28/2012    Procedure: CYSTOSCOPY;  Surgeon: Bernestine Amass, MD;  Location: [facility];  Service: Urology;  Laterality: N/A;  . Transurethral incision of prostate N/A 12/28/2012    Procedure: TRANSURETHRAL INCISION OF THE PROSTATE (TUIP);  Surgeon: Bernestine Amass, MD;  Location: [facility];  Service: Urology;  Laterality: N/A;  . Ulnar nerve transposition    . Insert /  replace / remove pacemaker  01/04/2014    [employer] model L121 serial number [id number]  . Basal cell carcinoma excision      nose  . Permanent pacemaker insertion N/A 01/04/2014    Procedure: PERMANENT PACEMAKER INSERTION;  Surgeon: Sanda Klein, MD;  Location: [place] [facility]ATH [facility];  Service: Cardiovascular;  Laterality: N/A;    Family History  Problem Relation Age of Onset  . Stroke Father   . Lung cancer Mother     Allergies  Allergen Reactions  . Penicillins Other (See Comments)    Unknown childhood reaction    Current Outpatient Prescriptions on File Prior to Visit  Medication Sig Dispense Refill  . acetaminophen (TYLENOL) 325 MG tablet Take 1-2 tablets (325-650 mg total) by mouth every 4 (four) hours as needed for mild pain.    . Alpha-Lipoic Acid 600 MG CAPS Take 1 capsule by mouth daily.    Marland Kitchen ALPRAZolam (XANAX)  0.25 MG tablet Take 1 tablet (0.25 mg total) by mouth 2 (two) times daily as needed. (Patient taking differently: Take 0.25 mg by mouth daily. ) 30 tablet 2  . aspirin 81 MG tablet Take 81 mg by mouth daily.    . B Complex Vitamins (B COMPLEX 50) TABS Take 1 tablet by mouth every morning.     . carbidopa-levodopa (SINEMET IR) 25-100 MG per tablet Take 1 tablet by mouth 3 (three) times daily before meals. 270 tablet 1  . empagliflozin (JARDIANCE) 25 MG TABS tablet Take 25 mg by mouth daily. 90 tablet 1  . fish oil-omega-3 fatty acids 1000 MG capsule Take 2 g by mouth daily.    . folic acid (FOLVITE) 1 MG tablet Take 1 tablet (1 mg total) by mouth every morning. 90 tablet 1  . gabapentin (NEURONTIN) 300 MG capsule Take 1 capsule (300 mg total) by mouth 2 (two) times daily. Take one tablet at night for one week before increasing to BID 60 capsule 3  . hydrocortisone 0.5 % cream Apply 1 application topically 2 (two) times daily as needed. rash    . ibuprofen (ADVIL,MOTRIN) 200 MG tablet Take 200 mg by mouth daily as needed for mild pain.     Marland Kitchen levothyroxine (SYNTHROID, LEVOTHROID) 50 MCG tablet Take 1 tablet (50 mcg total) by mouth daily before breakfast. 90 tablet 3  . mirabegron ER (MYRBETRIQ) 25 MG TB24 tablet Take 25 mg by mouth daily.    Vladimir Faster Glycol-Propyl Glycol (SYSTANE ULTRA) 0.4-0.3 % SOLN Place 1 drop into both eyes daily as needed (dry eyes).     . sertraline (ZOLOFT) 25 MG tablet Take 25 mg by mouth every morning.    . simvastatin (ZOCOR) 20 MG tablet Take 1 tablet (20 mg total) by mouth every evening. 90 tablet 1  . tadalafil (CIALIS) 5 MG tablet Take 5 mg by mouth every morning.     . tamsulosin (FLOMAX) 0.4 MG CAPS capsule TAKE 1 CAPSULE BY MOUTH EVERY MORNING 30 capsule 5  . Testosterone (AXIRON) 30 MG/ACT SOLN PLACE 2 APPLICATORFULS ONTO THE SKIN ONCE DAILY 90 mL 1  . vitamin B-12 (CYANOCOBALAMIN) 1000 MCG tablet Take 1,000 mcg by mouth every morning.      No current  facility-administered medications on file prior to visit.    BP 120/60 mmHg  Pulse 77  Temp(Src) 97.7 F (36.5 C) (Oral)  Ht 5\' 7"  (1.702 m)  Wt 173 lb (78.472 kg)  BMI 27.09 kg/m2  SpO2 97%  1. Risk factors, based on  past  M,S,F history cardiovascular risk factors include hypertension, dyslipidemia and type 2 diabetes.  Status post a smoker insertion for sick sinus syndrome     2.  Physical activities: Exercises regularly.  Frequent use of PD classes/programs  3.  Depression/mood:No history of major depression   4.  Hearing:No major deficits  5.  ADL's:Independent  6.  Fall risk:Moderately high due to PD and gait instability  7.  Home safety:No problems identified  8.  Height weight, and visual acuity;Height and weight stable no change in visual acuity  9.  Counseling:Heart healthy diet.  Encouraged  10. Lab orders based on risk factors:We'll check hemoglobin A1c.  We'll follow-up on testosterone level  11. Referral  Follow-up neurology and cardiology  12. Care plan:We'll continue efforts at aggressive risk factor modification  13. Cognitive assessment: Alert and oriented with normal affect.  No cognitive dysfunction  14. Screening: Patient provided with a written and personalized 5-10 year screening schedule in the AVS.  We'll continue annual clinical exams neurology follow-up and cardiology follow-up.  Yearly eye examinations.  Encouraged  15. Provider List Update: Includes primary care neurology, ophthalmology, cardiology, urology and pulmonary medicine      Review of Systems  Constitutional: Positive for fatigue. Negative for fever, chills and appetite change.  HENT: Negative for congestion, dental problem, ear pain, hearing loss, sore throat, tinnitus, trouble swallowing and voice change.   Eyes: Negative for pain, discharge and visual disturbance.  Respiratory: Positive for shortness of breath. Negative for cough, chest tightness, wheezing and stridor.     Cardiovascular: Negative for chest pain, palpitations and leg swelling.  Gastrointestinal: Negative for nausea, vomiting, abdominal pain, diarrhea, constipation, blood in stool and abdominal distention.  Genitourinary: Negative for urgency, hematuria, flank pain, discharge, difficulty urinating and genital sores.  Musculoskeletal: Negative for myalgias, back pain, joint swelling, arthralgias, gait problem and neck stiffness.  Skin: Negative for rash.  Neurological: Negative for dizziness, syncope, speech difficulty, weakness, numbness and headaches.  Hematological: Negative for adenopathy. Does not bruise/bleed easily.  Psychiatric/Behavioral: Negative for behavioral problems and dysphoric mood. The patient is not nervous/anxious.        Objective:   Physical Exam  Constitutional: He is oriented to person, place, and time. He appears well-developed.  HENT:  Head: Normocephalic.  Right Ear: External ear normal.  Left Ear: External ear normal.  Eyes: Conjunctivae and EOM are normal.  Neck: Normal range of motion.  Cardiovascular: Normal rate and normal heart sounds.   Decreased left dorsalis pedis pulse  Pulmonary/Chest: Breath sounds normal.  Abdominal: Bowel sounds are normal.  Musculoskeletal: Normal range of motion. He exhibits no edema or tenderness.  Neurological: He is alert and oriented to person, place, and time.  Psychiatric: He has a normal mood and affect. His behavior is normal.          Assessment & Plan:   Diabetes mellitus.  Will check a hemoglobin A1c.  If this remains in excellent control.  Will repeat in 6 months PD.  Followup neurology Osteoarthritis Dyslipidemia.  Continue simvastatin Hypothyroidism.

## 2014-11-29 NOTE — Patient Instructions (Signed)
Limit your sodium (Salt) intake    It is important that you exercise regularly, at least 20 minutes 3 to 4 times per week.  If you develop chest pain or shortness of breath seek  medical attention.  Return in 6 months for follow-up  

## 2014-11-29 NOTE — Progress Notes (Signed)
Pre visit review using our clinic review tool, if applicable. No additional management support is needed unless otherwise documented below in the visit note. 

## 2014-11-30 ENCOUNTER — Telehealth: Payer: Self-pay

## 2014-11-30 NOTE — Telephone Encounter (Signed)
Left message on voicemail to call office.  

## 2014-11-30 NOTE — Telephone Encounter (Signed)
The pt stated after getting a flu and pneumonia shot, he is unable to raise his arm.  He is hoping to get advice on what to do regarding this.

## 2014-11-30 NOTE — Telephone Encounter (Signed)
Spoke to pt, said his right arm is sore and hard to raise it but is getting better. Told pt sorry sometimes that happens, take Ibuprofen and apply ice and should be better in a day or two. Pt verbalized understanding.

## 2014-12-02 ENCOUNTER — Other Ambulatory Visit: Payer: Self-pay | Admitting: Internal Medicine

## 2014-12-12 ENCOUNTER — Encounter: Payer: Self-pay | Admitting: Cardiovascular Disease

## 2014-12-26 ENCOUNTER — Telehealth: Payer: Self-pay | Admitting: Internal Medicine

## 2014-12-26 MED ORDER — TAMSULOSIN HCL 0.4 MG PO CAPS: 0.4000 mg | ORAL_CAPSULE | Freq: Every morning | ORAL | Status: DC

## 2014-12-26 NOTE — Telephone Encounter (Signed)
Pt is requesting a 90 day supply.

## 2014-12-26 NOTE — Telephone Encounter (Signed)
Pt request refill of the following: tamsulosin (FLOMAX) 0.4 MG CAPS capsule   Phamacy: [facility]

## 2014-12-26 NOTE — Telephone Encounter (Signed)
Rx sent to pharmacy 90 day supply with refills.

## 2015-01-12 ENCOUNTER — Ambulatory Visit (INDEPENDENT_AMBULATORY_CARE_PROVIDER_SITE_OTHER): Payer: Medicare Other | Admitting: Neurology

## 2015-01-12 ENCOUNTER — Encounter: Payer: Self-pay | Admitting: Neurology

## 2015-01-12 VITALS — BP 92/60 | HR 76 | Ht 69.0 in | Wt 175.0 lb

## 2015-01-12 DIAGNOSIS — G609 Hereditary and idiopathic neuropathy, unspecified: Secondary | ICD-10-CM

## 2015-01-12 DIAGNOSIS — I6521 Occlusion and stenosis of right carotid artery: Secondary | ICD-10-CM

## 2015-01-12 DIAGNOSIS — G20 Parkinson's disease: Secondary | ICD-10-CM

## 2015-01-12 MED ORDER — CARBIDOPA-LEVODOPA 25-100 MG PO TABS: 1.5000 | ORAL_TABLET | Freq: Three times a day (TID) | ORAL | Status: DC

## 2015-01-12 NOTE — Progress Notes (Signed)
Stephen Robbins was seen today in f/u in the movement clinic.  He was dx with PD last visit, on 06/17/13.  He is on carbidopa/levodopa 25/100 tid.  He has no side effects with the carbidopa/levodopa, but states that "I just don't know what to expect."  He continues to have tremor, which can be bothersome for him.  He has had no falls.  No lightheadedness.  No near syncope.  No hallucinations.  He had a carotid ultrasound done since last visit.  There is 1-39% stenosis bilaterally.  He did have some lab work done since last visit.  His hemoglobin A1c has greatly improved.  It is 6.3 (was 7.9 and prior to that was 9.4). He just finished PT and is enrolled in the exercise class now.  He presents today with a long list of questions.  He again tape records our session.    11/19/13 update:  Pt is f/u regarding Parkinson's disease.  He is currently on carbidopa/levodopa 25/100, one tablet 3 times per day. He is accompanied by his wife, who supplements the history.   Patient denies any falls.  He denies hallucinations.  Denies nausea or vomiting.  No lightheadedness or near syncope.  He is exercising.  He still has tremor and isn't sure that the levodopa helps tremor.  He sent me several e-mails since last visit.  One of these was just a TV news link regarding [facility] high-resolution imaging and deep brain stimulation.  He subsequently sent another e-mail regarding focused ultrasound.  Asks about updates regarding these and has 2 pages of questions.  Asks about why we can't give him human growth hormone or steroids for PD.  Overall, patient is clinically doing well.   02/21/14 update:   Pt returns for f/u, accompanied by his wife who supplements the history.  The records that were made available to me were reviewed.  Pt has had PPM since last visit due to bradycardia.  He has been told not to exercise until 12/9.  He will start with PWR moves classes after the first of the year as well as circuit 2 classes.  He is still  on carbidopa/levodopa 25/100 at 8-9am/12-1pm/6-7 pm.  He is c/o paresthesias/"neuropathy" in the R foot.  He only notices it with driving.   He has no back pain.  He has no sensation of balance loss with closing eyes in the shower.  He thinks that exposure to levodopa causes the neuropathy and brings with him a small study (58 pts) that suggests that levodopa could be an etiology for the neuropathy.  Notices lack of smell.  No falls.  Rare lightheadedness. No hallucinations.    05/23/14 update:  Pt is f/u today, accompanied by his wife who supplements the history.  He went to [facility] since our last visit and I reviewed those records.  He was told he had PN (known diagnosis) but no new meds were prescribed.  He asks about a "cure" for PN that a doctor in [place] was advertising.   He remains on carbidopa/levodopa 25/100 tid for the PD.  He is exercising faithfully.  He has trouble getting himself over 3 miles per hour when walking and he is concerned about that.  He asks again about taking steroids and human growth hormone.  He also asks about taking protein supplements.  He has d/c the glucosamine for joint pain/arthritis as he didn't think that it was helpful.    09/08/14 update:  The patient  is following up today.  He is accompanied by his wife who supplements the history.   Records were reviewed since last visit, from his primary care physician, podiatrist, as well as his cardiologist.  He remains on carbidopa/levodopa 25/100, one tablet 3 times per day.  He notices no wearing off.  He is doing PD circuit class.  He called his podiatrist since last visit and asked him about medications for neuropathy and he deferred to me.  We started gabapentin on May 30, 2014.  He is on 300 mg twice a day.  He states that his feet numbness is better.  He has continued to complain about fatigue.  His pacemaker was recently adjusted.  He is trying to walk/exercise at the park and his endurance has been better ever  since.  He again asks about energy.  He is back on testosterone for that.    01/12/15 update:  The patient returns today for follow-up, accompanied by his wife who supplements the history.  He remains on carbidopa/levodopa 25/100, one tablet 3 times per day.  Overall, the patient states that he is doing well.   Still has some intermittent tremor.  He denies any syncope.  He has had no falls since last visit.  He has had some near falls.  No hallucinations or visual distortions.  He is on gabapentin, 300 mg twice a day for peripheral neuropathy secondary to diabetes mellitus. Pt states that it is helping with the sx's, along with the "natural herb that I take."   He continues to faithfully exercise with the PWR classes.  Some lightheaded after exercise.  No new medical issues since last visit.    No neuroimaging since CT brain in 2009.  PREVIOUS MEDICATIONS: none to date  ALLERGIES:   Allergies  Allergen Reactions  . Penicillins Other (See Comments)    Unknown childhood reaction    CURRENT MEDICATIONS:  Current Outpatient Prescriptions on File Prior to Visit  Medication Sig Dispense Refill  . acetaminophen (TYLENOL) 325 MG tablet Take 1-2 tablets (325-650 mg total) by mouth every 4 (four) hours as needed for mild pain.    . Alpha-Lipoic Acid 600 MG CAPS Take 1 capsule by mouth daily.    Marland Kitchen ALPRAZolam (XANAX) 0.25 MG tablet Take 1 tablet (0.25 mg total) by mouth 2 (two) times daily as needed. (Patient taking differently: Take 0.25 mg by mouth daily. ) 30 tablet 2  . aspirin 81 MG tablet Take 81 mg by mouth daily.    . B Complex Vitamins (B COMPLEX 50) TABS Take 1 tablet by mouth every morning.     . empagliflozin (JARDIANCE) 25 MG TABS tablet Take 25 mg by mouth daily. 90 tablet 1  . fish oil-omega-3 fatty acids 1000 MG capsule Take 2 g by mouth daily.    . folic acid (FOLVITE) 1 MG tablet Take 1 tablet (1 mg total) by mouth every morning. 90 tablet 1  . gabapentin (NEURONTIN) 300 MG capsule  Take 1 capsule (300 mg total) by mouth 2 (two) times daily. Take one tablet at night for one week before increasing to BID 60 capsule 3  . hydrocortisone 0.5 % cream Apply 1 application topically 2 (two) times daily as needed. rash    . ibuprofen (ADVIL,MOTRIN) 200 MG tablet Take 200 mg by mouth daily as needed for mild pain.     Marland Kitchen levothyroxine (SYNTHROID, LEVOTHROID) 50 MCG tablet Take 1 tablet (50 mcg total) by mouth daily before breakfast. 90 tablet 3  .  mirabegron ER (MYRBETRIQ) 25 MG TB24 tablet Take 25 mg by mouth daily.    Vladimir Faster Glycol-Propyl Glycol (SYSTANE ULTRA) 0.4-0.3 % SOLN Place 1 drop into both eyes daily as needed (dry eyes).     . sertraline (ZOLOFT) 25 MG tablet Take 25 mg by mouth every morning.    . simvastatin (ZOCOR) 20 MG tablet Take 1 tablet (20 mg total) by mouth every evening. 90 tablet 1  . tadalafil (CIALIS) 5 MG tablet Take 5 mg by mouth every morning.     . tamsulosin (FLOMAX) 0.4 MG CAPS capsule Take 1 capsule (0.4 mg total) by mouth every morning. 90 capsule 3  . Testosterone (AXIRON) 30 MG/ACT SOLN PLACE 2 APPLICATORFULS ONTO THE SKIN ONCE DAILY 90 mL 1  . vitamin B-12 (CYANOCOBALAMIN) 1000 MCG tablet Take 1,000 mcg by mouth every morning.      No current facility-administered medications on file prior to visit.    PAST MEDICAL HISTORY:   Past Medical History  Diagnosis Date  . Hyperlipidemia   . Hemorrhoids   . Hypothyroidism   . Asymptomatic bilateral carotid artery stenosis     per duplex  05-15-2012  left >39%/   right 40-59%  . Bradycardia   . Fatigue   . Dizzy   . BPH (benign prostatic hypertrophy) with urinary obstruction   . NSVT (nonsustained ventricular tachycardia)     cardiologist-  dr croitoru  . Chronotropic incompetence with sinus node dysfunction   . Borderline diabetes   . GERD (gastroesophageal reflux disease)     occasional  . Compression of lumbar vertebra     L4 -- L5  . Compressed cervical disc   . Frequency of  urination   . Urgency of urination   . Nocturia   . Wears glasses   . Pacemaker   . Bronchial pneumonia 1958  . Dysmetabolic syndrome   . Arthritis     "joints; a little" (01/04/2014)  . Basal cell carcinoma     nose    PAST SURGICAL HISTORY:   Past Surgical History  Procedure Laterality Date  . Neuroplasty / transposition ulnar nerve at elbow Left 02-09-2010  . Closed reduction nasal fracture  09-01-2007  . Exercise tolerence test  12-03-2012  DR CROITURO    CHRONOTROPIC INCOMPETENCE/ NORMAL RESTING BP W/ APPROPRIATE RESPONSE/ NO CHEST PAIN/ NO ST CHANGES FROM BASELINE  . Laceration repair Right 1978    middle finger  . Tonsillectomy and adenoidectomy  1944  . Cystoscopy N/A 12/28/2012    Procedure: CYSTOSCOPY;  Surgeon: Bernestine Amass, MD;  Location: [facility];  Service: Urology;  Laterality: N/A;  . Transurethral incision of prostate N/A 12/28/2012    Procedure: TRANSURETHRAL INCISION OF THE PROSTATE (TUIP);  Surgeon: Bernestine Amass, MD;  Location: [facility];  Service: Urology;  Laterality: N/A;  . Ulnar nerve transposition    . Insert / replace / remove pacemaker  01/04/2014    [employer] model L121 serial number [id number]  . Basal cell carcinoma excision      nose  . Permanent pacemaker insertion N/A 01/04/2014    Procedure: PERMANENT PACEMAKER INSERTION;  Surgeon: Sanda Klein, MD;  Location: [facility];  Service: Cardiovascular;  Laterality: N/A;    SOCIAL HISTORY:   Social History   Social History  . Marital Status: Married    Spouse Name: N/A  . Number of Children: 0  . Years of Education: N/A   Occupational History  .  retired     Programme researcher, broadcasting/film/video    Social History Main Topics  . Smoking status: Former Smoker -- 1.00 packs/day for 10 years    Types: Cigarettes    Quit date: 03/25/1968  . Smokeless tobacco: Never Used  . Alcohol Use: 4.2 oz/week    7 Glasses of wine per week     Comment: 1 glass wine daily  .  Drug Use: No  . Sexual Activity: Not Currently   Other Topics Concern  . Not on file   Social History Narrative   Regular exercise          FAMILY HISTORY:   Family Status  Relation Status Death Age  . Father Deceased     stroke  . Mother Deceased     lung cancer  . Brother Alive     healthy    ROS:    A complete 10 system review of systems was obtained and was unremarkable apart from what is mentioned above.  PHYSICAL EXAMINATION:    VITALS:   Filed Vitals:   01/12/15 1459  BP: 92/60  Pulse: 76  Height: 5\' 9"  (1.753 m)  Weight: 175 lb (79.379 kg)   My medical assistant was present for the entire history/PE.  GEN:  The patient appears stated age and is in NAD. HEENT:  [place]/AT.  MMM CV:  RRR Lungs:  CTAB Neck:  No bruits   Neurological examination:  Orientation: The patient is alert and oriented x3. Fund of knowledge is appropriate.  Recent and remote memory are intact.  Cranial nerves: There is good facial symmetry. Mild facial hypomimia.  Pupils are equal round and reactive to light bilaterally. Fundoscopic exam reveals clear margins bilaterally. Extraocular muscles are intact. The visual fields are full to confrontational testing. The speech is fluent and clear.  Soft palate rises symmetrically and there is no tongue deviation. Hearing is intact to conversational tone. Motor: Strength is 5/5 in the bilateral upper and lower extremities.   Shoulder shrug is equal and symmetric.  There is no pronator drift. Sensation:  Intact to light touch.  More sensitive aspects not tested today  Movement examination: Tone: There is mild rigidity in the LUE Abnormal movements: There is intermittent LUE resting tremor Coordination:  There are decreased RAM's on the L, particularly with toe taps Gait and Station: The patient has no difficulty arising out of a deep-seated chair without the use of the hands. The patient's stride length is fairly normal with just slight decreased  arm swing on the L.  Negative pull test.      LABS:  Lab Results  Component Value Date   TSH 2.80 06/16/2014   Lab Results  Component Value Date   VITAMINB12 872 06/17/2013     Chemistry      Component Value Date/Time   NA 136 06/16/2014 1351   K 4.1 06/16/2014 1351   CL 102 06/16/2014 1351   CO2 30 06/16/2014 1351   BUN 13 06/16/2014 1351   CREATININE 0.73 06/16/2014 1351   CREATININE 0.77 12/29/2013 1627      Component Value Date/Time   CALCIUM 9.4 06/16/2014 1351   ALKPHOS 41 06/16/2014 1351   AST 32 06/16/2014 1351   ALT 9 06/16/2014 1351   BILITOT 1.0 06/16/2014 1351     RPR negative Lab Results  Component Value Date   FOLATE >20.0 06/17/2013     Lab Results  Component Value Date   HGBA1C 6.0 11/29/2014    ASSESSMENT/PLAN:  1. idiopathic Parkinson's disease.  I see no atypical features.    -Greater than 50% of the 30 minute visit was again spent in counseling answering questions and talking about what to expect now as well as in the future.  We talked about medication options as well as potential future surgical options.   We talked about safety in the home.  We talked about nutrition.  -He will increase the carbidopa/levodopa 25/100, to 1.5 tablets 3 times daily as he his a bit more rigid.  I will refill this today.    -Pt asked again about protein effect on efficacy of levodopa and we reviewed that again today.   -brought a long list of questions and answered them to the best of my ability. 2.  Peripheral neuropathy  -There is evidence of this on examination.  Likely from DM.  His workup for reversible causes was negative.  His diabetes is under much better control than in the past.  -improved on gabapentin 300 mg bid.   3.  Hx of transaminasemia  -These were elevated in 2012 but have improved 4.  R carotid bruit with hx of carotid stenosis  -04/2012 u/s with 40-50% stenosis on the R.  this was rechecked in 2015 and was 1-39% bilaterally. 5. Follow up is  anticipated in the next few months, sooner should new neurologic issues arise.

## 2015-01-12 NOTE — Patient Instructions (Signed)
Increase Carbidopa Levodopa 25/100 to 1.5 tablets three times daily.

## 2015-01-16 ENCOUNTER — Other Ambulatory Visit: Payer: Self-pay | Admitting: Neurology

## 2015-01-16 MED ORDER — GABAPENTIN 300 MG PO CAPS: 300.0000 mg | ORAL_CAPSULE | Freq: Two times a day (BID) | ORAL | Status: DC

## 2015-01-16 NOTE — Telephone Encounter (Signed)
Gabapentin refill requested. Per last office note- patient to remain on medication. Refill approved and sent to patient's pharmacy.   

## 2015-01-17 ENCOUNTER — Encounter: Payer: Self-pay | Admitting: Internal Medicine

## 2015-01-27 ENCOUNTER — Telehealth: Payer: Self-pay | Admitting: Internal Medicine

## 2015-01-27 MED ORDER — TESTOSTERONE 30 MG/ACT TD SOLN: TRANSDERMAL | Status: DC

## 2015-01-27 NOTE — Telephone Encounter (Signed)
Pt request refill of the following: Testosterone (AXIRON) 30 MG/ACT SOLN   Phamacy: [facility]

## 2015-01-27 NOTE — Telephone Encounter (Signed)
Pt notified Rx for Testosterone faxed to pharmacy. Pt verbalized understanding.

## 2015-02-09 DIAGNOSIS — H2513 Age-related nuclear cataract, bilateral: Secondary | ICD-10-CM | POA: Diagnosis not present

## 2015-02-09 DIAGNOSIS — H00015 Hordeolum externum left lower eyelid: Secondary | ICD-10-CM | POA: Diagnosis not present

## 2015-02-09 LAB — HM DIABETES EYE EXAM

## 2015-02-20 ENCOUNTER — Encounter: Payer: Self-pay | Admitting: Cardiovascular Disease

## 2015-02-20 NOTE — Progress Notes (Signed)
Pacemaker incidentally recorded a 17 hour episode of atrial fibrillation with controlled rate. CHADSVasc score 2 (age > 67 only). Will bring in for office appointment to discuss anticoagulation options.

## 2015-02-21 ENCOUNTER — Ambulatory Visit (INDEPENDENT_AMBULATORY_CARE_PROVIDER_SITE_OTHER): Payer: Medicare Other | Admitting: *Deleted

## 2015-02-21 ENCOUNTER — Telehealth: Payer: Self-pay | Admitting: Cardiology

## 2015-02-21 DIAGNOSIS — I495 Sick sinus syndrome: Secondary | ICD-10-CM | POA: Diagnosis not present

## 2015-02-21 NOTE — Telephone Encounter (Signed)
Spoke with pt and reminded pt of remote transmission that is due today. Pt verbalized understanding.   

## 2015-02-22 ENCOUNTER — Other Ambulatory Visit: Payer: Self-pay | Admitting: Internal Medicine

## 2015-02-22 NOTE — Progress Notes (Signed)
Remote pacemaker transmission.   

## 2015-02-23 ENCOUNTER — Ambulatory Visit (INDEPENDENT_AMBULATORY_CARE_PROVIDER_SITE_OTHER): Payer: Medicare Other | Admitting: Cardiovascular Disease

## 2015-02-23 ENCOUNTER — Encounter: Payer: Self-pay | Admitting: Cardiovascular Disease

## 2015-02-23 VITALS — BP 126/77 | HR 73 | Resp 16 | Ht 69.0 in | Wt 178.5 lb

## 2015-02-23 DIAGNOSIS — I472 Ventricular tachycardia: Secondary | ICD-10-CM | POA: Diagnosis not present

## 2015-02-23 DIAGNOSIS — R001 Bradycardia, unspecified: Secondary | ICD-10-CM

## 2015-02-23 DIAGNOSIS — Z950 Presence of cardiac pacemaker: Secondary | ICD-10-CM

## 2015-02-23 DIAGNOSIS — I4891 Unspecified atrial fibrillation: Secondary | ICD-10-CM | POA: Diagnosis not present

## 2015-02-23 DIAGNOSIS — I495 Sick sinus syndrome: Secondary | ICD-10-CM

## 2015-02-23 DIAGNOSIS — I480 Paroxysmal atrial fibrillation: Secondary | ICD-10-CM

## 2015-02-23 DIAGNOSIS — I4729 Other ventricular tachycardia: Secondary | ICD-10-CM

## 2015-02-23 DIAGNOSIS — G9089 Other disorders of autonomic nervous system: Secondary | ICD-10-CM

## 2015-02-23 DIAGNOSIS — I498 Other specified cardiac arrhythmias: Secondary | ICD-10-CM

## 2015-02-23 DIAGNOSIS — I6521 Occlusion and stenosis of right carotid artery: Secondary | ICD-10-CM

## 2015-02-23 DIAGNOSIS — G909 Disorder of the autonomic nervous system, unspecified: Secondary | ICD-10-CM

## 2015-02-23 DIAGNOSIS — G908 Other disorders of autonomic nervous system: Secondary | ICD-10-CM

## 2015-02-23 LAB — CUP PACEART INCLINIC DEVICE CHECK
Date Time Interrogation Session: 20161208151825
Implantable Lead Implant Date: 20151013
Implantable Lead Implant Date: 20151013
Implantable Lead Location: 753859
Implantable Lead Location: 753860
Implantable Lead Model: 4135
Implantable Lead Model: 4136
Implantable Lead Serial Number: 29480373
Implantable Lead Serial Number: 29608302
Lead Channel Setting Pacing Amplitude: 2 V
Lead Channel Setting Pacing Amplitude: 4 V
Lead Channel Setting Pacing Pulse Width: 1 ms
Lead Channel Setting Sensing Sensitivity: 2.5 mV
Pulse Gen Serial Number: 702951

## 2015-02-23 MED ORDER — RIVAROXABAN 20 MG PO TABS: 20.0000 mg | ORAL_TABLET | Freq: Every day | ORAL | Status: DC

## 2015-02-23 NOTE — Patient Instructions (Addendum)
Your physician has recommended you make the following change in your medication:   START XARELTO 20 MG DAILY WITH YOUR EVENING MEAL  STOP ASPIRIN  Dr. Sallyanne Kuster recommends that you schedule a follow-up appointment in: 3 MONTHS WITH DEVICE CHECK ([place]).   Atrial Fibrillation Atrial fibrillation is a type of irregular or rapid heartbeat (arrhythmia). In atrial fibrillation, the heart quivers continuously in a chaotic pattern. This occurs when parts of the heart receive disorganized signals that make the heart unable to pump blood normally. This can increase the risk for stroke, heart failure, and other heart-related conditions. There are different types of atrial fibrillation, including:  Paroxysmal atrial fibrillation. This type starts suddenly, and it usually stops on its own shortly after it starts.  Persistent atrial fibrillation. This type often lasts longer than a week. It may stop on its own or with treatment.  Long-lasting persistent atrial fibrillation. This type lasts longer than 12 months.  Permanent atrial fibrillation. This type does not go away. Talk with your health care provider to learn about the type of atrial fibrillation that you have. CAUSES This condition is caused by some heart-related conditions or procedures, including:  A heart attack.  Coronary artery disease.  Heart failure.  Heart valve conditions.  High blood pressure.  Inflammation of the sac that surrounds the heart (pericarditis).  Heart surgery.  Certain heart rhythm disorders, such as Wolf-Parkinson-White syndrome. Other causes include:  Pneumonia.  Obstructive sleep apnea.  Blockage of an artery in the lungs (pulmonary embolism, or PE).  Lung cancer.  Chronic lung disease.  Thyroid problems, especially if the thyroid is overactive (hyperthyroidism).  Caffeine.  Excessive alcohol use or illegal drug use.  Use of some medicines, including certain decongestants and  diet pills. Sometimes, the cause cannot be found. RISK FACTORS This condition is more likely to develop in:  People who are older in age.  People who smoke.  People who have diabetes mellitus.  People who are overweight (obese).  Athletes who exercise vigorously. SYMPTOMS Symptoms of this condition include:  A feeling that your heart is beating rapidly or irregularly.  A feeling of discomfort or pain in your chest.  Shortness of breath.  Sudden light-headedness or weakness.  Getting tired easily during exercise. In some cases, there are no symptoms. DIAGNOSIS Your health care provider may be able to detect atrial fibrillation when taking your pulse. If detected, this condition may be diagnosed with:  An electrocardiogram (ECG).  A Holter monitor test that records your heartbeat patterns over a 24-hour period.  Transthoracic echocardiogram (TTE) to evaluate how blood flows through your heart.  Transesophageal echocardiogram (TEE) to view more detailed images of your heart.  A stress test.  Imaging tests, such as a CT scan or chest X-ray.  Blood tests. TREATMENT The main goals of treatment are to prevent blood clots from forming and to keep your heart beating at a normal rate and rhythm. The type of treatment that you receive depends on many factors, such as your underlying medical conditions and how you feel when you are experiencing atrial fibrillation. This condition may be treated with:  Medicine to slow down the heart rate, bring the heart's rhythm back to normal, or prevent clots from forming.  Electrical cardioversion. This is a procedure that resets your heart's rhythm by delivering a controlled, low-energy shock to the heart through your skin.  Different types of ablation, such as catheter ablation, catheter ablation with pacemaker, or surgical ablation. These procedures destroy  the heart tissues that send abnormal signals. When the pacemaker is used, it is  placed under your skin to help your heart beat in a regular rhythm. HOME CARE INSTRUCTIONS  Take over-the counter and prescription medicines only as told by your health care provider.  If your health care provider prescribed a blood-thinning medicine (anticoagulant), take it exactly as told. Taking too much blood-thinning medicine can cause bleeding. If you do not take enough blood-thinning medicine, you will not have the protection that you need against stroke and other problems.  Do not use tobacco products, including cigarettes, chewing tobacco, and e-cigarettes. If you need help quitting, ask your health care provider.  If you have obstructive sleep apnea, manage your condition as told by your health care provider.  Do not drink alcohol.  Do not drink beverages that contain caffeine, such as coffee, soda, and tea.  Maintain a healthy weight. Do not use diet pills unless your health care provider approves. Diet pills may make heart problems worse.  Follow diet instructions as told by your health care provider.  Exercise regularly as told by your health care provider.  Keep all follow-up visits as told by your health care provider. This is important. PREVENTION  Avoid drinking beverages that contain caffeine or alcohol.  Avoid certain medicines, especially medicines that are used for breathing problems.  Avoid certain herbs and herbal medicines, such as those that contain ephedra or ginseng.  Do not use illegal drugs, such as cocaine and amphetamines.  Do not smoke.  Manage your high blood pressure. SEEK MEDICAL CARE IF:  You notice a change in the rate, rhythm, or strength of your heartbeat.  You are taking an anticoagulant and you notice increased bruising.  You tire more easily when you exercise or exert yourself. SEEK IMMEDIATE MEDICAL CARE IF:  You have chest pain, abdominal pain, sweating, or weakness.  You feel nauseous.  You notice blood in your vomit, bowel  movement, or urine.  You have shortness of breath.  You suddenly have swollen feet and ankles.  You feel dizzy.  You have sudden weakness or numbness of the face, arm, or leg, especially on one side of the body.  You have trouble speaking, trouble understanding, or both (aphasia).  Your face or your eyelid droops on one side. These symptoms may represent a serious problem that is an emergency. Do not wait to see if the symptoms will go away. Get medical help right away. Call your local emergency services (911 in the [place] Do not drive yourself to the hospital.   This information is not intended to replace advice given to you by your health care provider. Make sure you discuss any questions you have with your health care provider.   Document Released: 03/11/2005 Document Revised: 11/30/2014 Document Reviewed: 07/06/2014 Elsevier Interactive Patient Education [employer].

## 2015-02-23 NOTE — Progress Notes (Signed)
Patient ID: Stephen Robbins, male   DOB: 28-Sep-1935, 79 y.o.   MRN: [id number]     Cardiology Office Note   Date:  02/23/2015   ID:  Stephen Robbins, DOB Oct 30, 1935, MRN [id number]  PCP:  Nyoka Cowden, MD  Cardiologist:  Sanda Klein, MD   Chief Complaint  Patient presents with  . Follow-up    no chest pain, occassional shortness of breath, no edema, no pain in legs, occassional cramping in legs, occassional lightheadedness, occassional dizziness      History of Present Illness: [name] is a 79 y.o. male who presents for  Discussion of newly diagnosed paroxysmal atrial fibrillation. The arrhythmia was recorded twice by his pacemaker and was asymptomatic on both occasions. One episode lasted for about 2 hours and the other for about 17 hours.  Ventricular rate was only minimally elevated. Pacemaker function is otherwise completely normal. We made one more slight adjustment to his minute ventilation sensor today to optimize response to activity.  The device is still at beginning of battery life and there is only roughly 34% atrial pacing and virtually no ventricular pacing.   functional status is good. He is primarily limited by Parkinson's disease. He has not had any serious falls in 4 years. He does not have any bleeding problems. He does have some orthostatic hypotension that is almost certainly related to Parkinson's disease and its treatment (systolic blood pressure dropped from 117 lying down to 102 standing for 3 minutes).  He did not have orthostatic dizziness.  Past Medical History  Diagnosis Date  . Hyperlipidemia   . Hemorrhoids   . Hypothyroidism   . Asymptomatic bilateral carotid artery stenosis     per duplex  05-15-2012  left >39%/   right 40-59%  . Bradycardia   . Fatigue   . Dizzy   . BPH (benign prostatic hypertrophy) with urinary obstruction   . NSVT (nonsustained ventricular tachycardia) [facility])     cardiologist-  dr Peggi Yono  . Chronotropic incompetence  with sinus node dysfunction (HCC)   . Borderline diabetes   . GERD (gastroesophageal reflux disease)     occasional  . Compression of lumbar vertebra (HCC)     L4 -- L5  . Compressed cervical disc   . Frequency of urination   . Urgency of urination   . Nocturia   . Wears glasses   . Pacemaker   . Bronchial pneumonia 1958  . Dysmetabolic syndrome   . Arthritis     "joints; a little" (01/04/2014)  . Basal cell carcinoma     nose    Past Surgical History  Procedure Laterality Date  . Neuroplasty / transposition ulnar nerve at elbow Left 02-09-2010  . Closed reduction nasal fracture  09-01-2007  . Exercise tolerence test  12-03-2012  DR CROITURO    CHRONOTROPIC INCOMPETENCE/ NORMAL RESTING BP W/ APPROPRIATE RESPONSE/ NO CHEST PAIN/ NO ST CHANGES FROM BASELINE  . Laceration repair Right 1978    middle finger  . Tonsillectomy and adenoidectomy  1944  . Cystoscopy N/A 12/28/2012    Procedure: CYSTOSCOPY;  Surgeon: Bernestine Amass, MD;  Location[facility];  Service: Urology;  Laterality: N/A;  . Transurethral incision of prostate N/A 12/28/2012    Procedure: TRANSURETHRAL INCISION OF THE PROSTATE (TUIP);  Surgeon: Bernestine Amass, MD;  Location: [facility];  Service: Urology;  Laterality: N/A;  . Ulnar nerve transposition    . Insert / replace / remove pacemaker  01/04/2014  [employer] model L121 serial number [id number]  . Basal cell carcinoma excision      nose  . Permanent pacemaker insertion N/A 01/04/2014    Procedure: PERMANENT PACEMAKER INSERTION;  Surgeon: Sanda Klein, MD;  Location: [facility];  Service: Cardiovascular;  Laterality: N/A;     Current Outpatient Prescriptions  Medication Sig Dispense Refill  . acetaminophen (TYLENOL) 325 MG tablet Take 1-2 tablets (325-650 mg total) by mouth every 4 (four) hours as needed for mild pain.    . Alpha-Lipoic Acid 600 MG CAPS Take 1 capsule by mouth daily.    Marland Kitchen ALPRAZolam  (XANAX) 0.25 MG tablet Take 1 tablet (0.25 mg total) by mouth 2 (two) times daily as needed. (Patient taking differently: Take 0.25 mg by mouth daily. ) 30 tablet 2  . B Complex Vitamins (B COMPLEX 50) TABS Take 1 tablet by mouth every morning.     . carbidopa-levodopa (SINEMET IR) 25-100 MG tablet Take 1.5 tablets by mouth 3 (three) times daily before meals. 405 tablet 1  . empagliflozin (JARDIANCE) 25 MG TABS tablet Take 25 mg by mouth daily. 90 tablet 1  . fish oil-omega-3 fatty acids 1000 MG capsule Take 2 g by mouth daily.    . folic acid (FOLVITE) 1 MG tablet Take 1 tablet (1 mg total) by mouth every morning. 90 tablet 1  . gabapentin (NEURONTIN) 300 MG capsule Take 1 capsule (300 mg total) by mouth 2 (two) times daily. 60 capsule 5  . hydrocortisone 0.5 % cream Apply 1 application topically 2 (two) times daily as needed. rash    . ibuprofen (ADVIL,MOTRIN) 200 MG tablet Take 200 mg by mouth daily as needed for mild pain.     Marland Kitchen levothyroxine (SYNTHROID, LEVOTHROID) 50 MCG tablet Take 1 tablet (50 mcg total) by mouth daily before breakfast. 90 tablet 3  . mirabegron ER (MYRBETRIQ) 25 MG TB24 tablet Take 25 mg by mouth daily.    Vladimir Faster Glycol-Propyl Glycol (SYSTANE ULTRA) 0.4-0.3 % SOLN Place 1 drop into both eyes daily as needed (dry eyes).     . sertraline (ZOLOFT) 25 MG tablet Take 25 mg by mouth every morning.    . simvastatin (ZOCOR) 20 MG tablet take 1 tablet by mouth every evening 90 tablet 1  . tadalafil (CIALIS) 5 MG tablet Take 5 mg by mouth every morning.     . tamsulosin (FLOMAX) 0.4 MG CAPS capsule Take 1 capsule (0.4 mg total) by mouth every morning. 90 capsule 3  . Testosterone (AXIRON) 30 MG/ACT SOLN PLACE 2 APPLICATORFULS ONTO THE SKIN ONCE DAILY 90 mL 1  . vitamin B-12 (CYANOCOBALAMIN) 1000 MCG tablet Take 1,000 mcg by mouth every morning.     . rivaroxaban (XARELTO) 20 MG TABS tablet Take 1 tablet (20 mg total) by mouth daily with supper. 90 tablet 1   No current  facility-administered medications for this visit.    Allergies:   Penicillins    Social History:  The patient  reports that he quit smoking about 46 years ago. His smoking use included Cigarettes. He has a 10 pack-year smoking history. He has never used smokeless tobacco. He reports that he drinks about 4.2 oz of alcohol per week. He reports that he does not use illicit drugs.   Family History:  The patient's family history includes Lung cancer in his mother; Stroke in his father.    ROS:  Please see the history of present illness.    Otherwise, review of systems  positive for none.   All other systems are reviewed and negative.    PHYSICAL EXAM: VS:  BP 126/77 mmHg  Pulse 73  Resp 16  Ht 5\' 9"  (1.753 m)  Wt 178 lb 8 oz (80.967 kg)  BMI 26.35 kg/m2 , BMI Body mass index is 26.35 kg/(m^2).  General: Alert, oriented x3, no distress Head: no evidence of trauma, PERRL, EOMI, no exophtalmos or lid lag, no myxedema, no xanthelasma; normal ears, nose and oropharynx Neck: normal jugular venous pulsations and no hepatojugular reflux; brisk carotid pulses without delay and left carotid bruit Chest: clear to auscultation, no signs of consolidation by percussion or palpation, normal fremitus, symmetrical and full respiratory excursions Cardiovascular: normal position and quality of the apical impulse, regular rhythm, normal first and second heart sounds, no murmurs, rubs or gallops Abdomen: no tenderness or distention, no masses by palpation, no abnormal pulsatility or arterial bruits, normal bowel sounds, no hepatosplenomegaly Extremities: no clubbing, cyanosis or edema; 2+ radial, ulnar and brachial pulses bilaterally; 2+ right femoral, posterior tibial and dorsalis pedis pulses; 2+ left femoral, posterior tibial and dorsalis pedis pulses; no subclavian or femoral bruits Neurological: grossly nonfocal Psych: euthymic mood, full affect   EKG:  EKG is not ordered today. The  Pacemaker  electrogram today demonstrates  Atrial paced ventricular sensed rhythm   Recent Labs: 06/16/2014: ALT 9; BUN 13; Creatinine, Ser 0.73; Hemoglobin 15.8; Platelets 157.0; Potassium 4.1; Sodium 136; TSH 2.80    Lipid Panel    Component Value Date/Time   CHOL 116 11/29/2014 1014   TRIG 83.0 11/29/2014 1014   TRIG 83 02/20/2006 0838   HDL 35.60* 11/29/2014 1014   CHOLHDL 3 11/29/2014 1014   CHOLHDL 3.4 CALC 02/20/2006 0838   VLDL 16.6 11/29/2014 1014   LDLCALC 64 11/29/2014 1014      Wt Readings from Last 3 Encounters:  02/23/15 178 lb 8 oz (80.967 kg)  01/12/15 175 lb (79.379 kg)  11/29/14 173 lb (78.472 kg)      Other studies Reviewed: Additional studies/ records that were reviewed today include: .   ASSESSMENT AND PLAN:  1. Paroxysmal atrial fibrillation, asymptomatic. The overall burden of arrhythmia is quite low , well under 1%. However he has very lengthy episodes including one that lasted for almost an entire day. We discussed the increased risk of embolic stroke related to atrial fibrillation and the benefit of oral anticoagulation. He is not at particularly high bleeding risk. Despite the presence of mild orthostatic hypotension and some gait difficulty with Parkinson's disease, it has been a very long time since he has had a fall. Overall , I think the benefit in stroke prevention outweighs the risk of bleeding with oral anticoagulants. We discussed the relative virtues of direct oral anticoagulants versus traditional warfarin. I think he is definitely better suited for one of the newer agents and prescribe Xarelto 20 mg at  dinnertime daily.  I told him he needs to take this with a full meal to ensure proper absorption. We reviewed the roughly 1%/year risk of serious bleeding complications with all of these agents. He will stop taking aspirin to reduce the bleeding risk.  he is asymptomatic and antiarrhythmics are not justified. I also elected not to administer any rate  control medications since these would inevitably worsen his orthostatic hypotension. The overall prevalence of atrial fibrillation is very low and the likelihood of tachycardia related complications is minimal.  2.  Sinus node dysfunction with symptomatic bradycardia and chronotropic incompetence. He  seems to have benefited from pacemaker implantation and we made what is probably one final adjustments to the minute ventilation sensor today. Heart rate histograms distribution is much closer to a physiological pattern now  3. Normal dual chamber permanent pacemaker function. Minute ventilation sensor alone has worked much better than the blended sensor. He is not pacemaker dependent.  4. Parkinson's disease     Current medicines are reviewed at length with the patient today.  The patient does not have concerns regarding medicines.  The following changes have been made:   Xarelto 20 mg daily  Labs/ tests ordered today include:  Orders Placed This Encounter  Procedures  . EKG 12-Lead    Patient Instructions  Your physician has recommended you make the following change in your medication:   START XARELTO 20 MG DAILY WITH YOUR EVENING MEAL  STOP ASPIRIN  Dr. Sallyanne Kuster recommends that you schedule a follow-up appointment in: 3 MONTHS WITH DEVICE CHECK ([employer]).   Atrial Fibrillation Atrial fibrillation is a type of irregular or rapid heartbeat (arrhythmia). In atrial fibrillation, the heart quivers continuously in a chaotic pattern. This occurs when parts of the heart receive disorganized signals that make the heart unable to pump blood normally. This can increase the risk for stroke, heart failure, and other heart-related conditions. There are different types of atrial fibrillation, including:  Paroxysmal atrial fibrillation. This type starts suddenly, and it usually stops on its own shortly after it starts.  Persistent atrial fibrillation. This type often lasts longer than a  week. It may stop on its own or with treatment.  Long-lasting persistent atrial fibrillation. This type lasts longer than 12 months.  Permanent atrial fibrillation. This type does not go away. Talk with your health care provider to learn about the type of atrial fibrillation that you have. CAUSES This condition is caused by some heart-related conditions or procedures, including:  A heart attack.  Coronary artery disease.  Heart failure.  Heart valve conditions.  High blood pressure.  Inflammation of the sac that surrounds the heart (pericarditis).  Heart surgery.  Certain heart rhythm disorders, such as Wolf-Parkinson-White syndrome. Other causes include:  Pneumonia.  Obstructive sleep apnea.  Blockage of an artery in the lungs (pulmonary embolism, or PE).  Lung cancer.  Chronic lung disease.  Thyroid problems, especially if the thyroid is overactive (hyperthyroidism).  Caffeine.  Excessive alcohol use or illegal drug use.  Use of some medicines, including certain decongestants and diet pills. Sometimes, the cause cannot be found. RISK FACTORS This condition is more likely to develop in:  People who are older in age.  People who smoke.  People who have diabetes mellitus.  People who are overweight (obese).  Athletes who exercise vigorously. SYMPTOMS Symptoms of this condition include:  A feeling that your heart is beating rapidly or irregularly.  A feeling of discomfort or pain in your chest.  Shortness of breath.  Sudden light-headedness or weakness.  Getting tired easily during exercise. In some cases, there are no symptoms. DIAGNOSIS Your health care provider may be able to detect atrial fibrillation when taking your pulse. If detected, this condition may be diagnosed with:  An electrocardiogram (ECG).  A Holter monitor test that records your heartbeat patterns over a 24-hour period.  Transthoracic echocardiogram (TTE) to evaluate how  blood flows through your heart.  Transesophageal echocardiogram (TEE) to view more detailed images of your heart.  A stress test.  Imaging tests, such as a CT scan or chest X-ray.  Blood tests. TREATMENT The main goals of treatment are to prevent blood clots from forming and to keep your heart beating at a normal rate and rhythm. The type of treatment that you receive depends on many factors, such as your underlying medical conditions and how you feel when you are experiencing atrial fibrillation. This condition may be treated with:  Medicine to slow down the heart rate, bring the heart's rhythm back to normal, or prevent clots from forming.  Electrical cardioversion. This is a procedure that resets your heart's rhythm by delivering a controlled, low-energy shock to the heart through your skin.  Different types of ablation, such as catheter ablation, catheter ablation with pacemaker, or surgical ablation. These procedures destroy the heart tissues that send abnormal signals. When the pacemaker is used, it is placed under your skin to help your heart beat in a regular rhythm. HOME CARE INSTRUCTIONS  Take over-the counter and prescription medicines only as told by your health care provider.  If your health care provider prescribed a blood-thinning medicine (anticoagulant), take it exactly as told. Taking too much blood-thinning medicine can cause bleeding. If you do not take enough blood-thinning medicine, you will not have the protection that you need against stroke and other problems.  Do not use tobacco products, including cigarettes, chewing tobacco, and e-cigarettes. If you need help quitting, ask your health care provider.  If you have obstructive sleep apnea, manage your condition as told by your health care provider.  Do not drink alcohol.  Do not drink beverages that contain caffeine, such as coffee, soda, and tea.  Maintain a healthy weight. Do not use diet pills unless your  health care provider approves. Diet pills may make heart problems worse.  Follow diet instructions as told by your health care provider.  Exercise regularly as told by your health care provider.  Keep all follow-up visits as told by your health care provider. This is important. PREVENTION  Avoid drinking beverages that contain caffeine or alcohol.  Avoid certain medicines, especially medicines that are used for breathing problems.  Avoid certain herbs and herbal medicines, such as those that contain ephedra or ginseng.  Do not use illegal drugs, such as cocaine and amphetamines.  Do not smoke.  Manage your high blood pressure. SEEK MEDICAL CARE IF:  You notice a change in the rate, rhythm, or strength of your heartbeat.  You are taking an anticoagulant and you notice increased bruising.  You tire more easily when you exercise or exert yourself. SEEK IMMEDIATE MEDICAL CARE IF:  You have chest pain, abdominal pain, sweating, or weakness.  You feel nauseous.  You notice blood in your vomit, bowel movement, or urine.  You have shortness of breath.  You suddenly have swollen feet and ankles.  You feel dizzy.  You have sudden weakness or numbness of the face, arm, or leg, especially on one side of the body.  You have trouble speaking, trouble understanding, or both (aphasia).  Your face or your eyelid droops on one side. These symptoms may represent a serious problem that is an emergency. Do not wait to see if the symptoms will go away. Get medical help right away. Call your local emergency services (911 in the [place] Do not drive yourself to the hospital.   This information is not intended to replace advice given to you by your health care provider. Make sure you discuss any questions you have with your health care provider.   Document Released: 03/11/2005 Document Revised: 11/30/2014  Document Reviewed: 07/06/2014 Elsevier Interactive Patient Education [address]        Signed, Golf, MD  02/23/2015 5:22 PM    Sanda Klein, MD, [facility] [phone] office [phone] pager

## 2015-02-24 ENCOUNTER — Encounter: Payer: Self-pay | Admitting: Cardiovascular Disease

## 2015-02-24 ENCOUNTER — Encounter: Payer: Self-pay | Admitting: Neurology

## 2015-02-27 ENCOUNTER — Other Ambulatory Visit: Payer: Self-pay | Admitting: *Deleted

## 2015-02-27 ENCOUNTER — Encounter: Payer: Self-pay | Admitting: Cardiovascular Disease

## 2015-02-27 MED ORDER — RIVAROXABAN 20 MG PO TABS: 20.0000 mg | ORAL_TABLET | Freq: Every day | ORAL | Status: DC

## 2015-02-27 NOTE — Telephone Encounter (Signed)
Xarelto Rx sent to pharmacy to pick up after samples are used.

## 2015-03-01 ENCOUNTER — Encounter: Payer: Self-pay | Admitting: Cardiovascular Disease

## 2015-03-02 LAB — CUP PACEART REMOTE DEVICE CHECK
Battery Remaining Longevity: 174 mo
Battery Remaining Percentage: 100 %
Brady Statistic RA Percent Paced: 38 %
Brady Statistic RV Percent Paced: 1 %
Date Time Interrogation Session: 20161129162600
Implantable Lead Implant Date: 20151013
Implantable Lead Implant Date: 20151013
Implantable Lead Location: 753859
Implantable Lead Location: 753860
Implantable Lead Model: 4135
Implantable Lead Model: 4136
Implantable Lead Serial Number: 29480373
Implantable Lead Serial Number: 29608302
Lead Channel Impedance Value: 527 Ohm
Lead Channel Impedance Value: 561 Ohm
Lead Channel Setting Pacing Amplitude: 2 V
Lead Channel Setting Pacing Amplitude: 4 V
Lead Channel Setting Pacing Pulse Width: 1 ms
Lead Channel Setting Sensing Sensitivity: 2.5 mV
Pulse Gen Serial Number: 702951

## 2015-03-03 ENCOUNTER — Encounter: Payer: Self-pay | Admitting: Cardiology

## 2015-03-04 ENCOUNTER — Other Ambulatory Visit: Payer: Self-pay | Admitting: Internal Medicine

## 2015-03-04 ENCOUNTER — Encounter: Payer: Self-pay | Admitting: Internal Medicine

## 2015-03-06 ENCOUNTER — Other Ambulatory Visit: Payer: Self-pay

## 2015-03-06 MED ORDER — EMPAGLIFLOZIN 25 MG PO TABS: 25.0000 mg | ORAL_TABLET | Freq: Every day | ORAL | Status: DC

## 2015-03-16 DIAGNOSIS — H43812 Vitreous degeneration, left eye: Secondary | ICD-10-CM | POA: Diagnosis not present

## 2015-03-19 ENCOUNTER — Other Ambulatory Visit: Payer: Self-pay | Admitting: Internal Medicine

## 2015-04-06 ENCOUNTER — Encounter: Payer: Self-pay | Admitting: Internal Medicine

## 2015-04-14 ENCOUNTER — Ambulatory Visit: Payer: Medicare Other | Admitting: Neurology

## 2015-04-18 ENCOUNTER — Encounter: Payer: Self-pay | Admitting: Neurology

## 2015-04-18 ENCOUNTER — Ambulatory Visit (INDEPENDENT_AMBULATORY_CARE_PROVIDER_SITE_OTHER): Payer: Medicare Other | Admitting: Neurology

## 2015-04-18 VITALS — BP 130/72 | HR 90 | Ht 69.0 in | Wt 179.0 lb

## 2015-04-18 DIAGNOSIS — G609 Hereditary and idiopathic neuropathy, unspecified: Secondary | ICD-10-CM

## 2015-04-18 DIAGNOSIS — G20 Parkinson's disease: Secondary | ICD-10-CM | POA: Diagnosis not present

## 2015-04-18 NOTE — Progress Notes (Signed)
Stephen Robbins was seen today in f/u in the movement clinic.  He was dx with PD last visit, on 06/17/13.  He is on carbidopa/levodopa 25/100 tid.  He has no side effects with the carbidopa/levodopa, but states that "I just don't know what to expect."  He continues to have tremor, which can be bothersome for him.  He has had no falls.  No lightheadedness.  No near syncope.  No hallucinations.  He had a carotid ultrasound done since last visit.  There is 1-39% stenosis bilaterally.  He did have some lab work done since last visit.  His hemoglobin A1c has greatly improved.  It is 6.3 (was 7.9 and prior to that was 9.4). He just finished PT and is enrolled in the exercise class now.  He presents today with a long list of questions.  He again tape records our session.    11/19/13 update:  Pt is f/u regarding Parkinson's disease.  He is currently on carbidopa/levodopa 25/100, one tablet 3 times per day. He is accompanied by his wife, who supplements the history.   Patient denies any falls.  He denies hallucinations.  Denies nausea or vomiting.  No lightheadedness or near syncope.  He is exercising.  He still has tremor and isn't sure that the levodopa helps tremor.  He sent me several e-mails since last visit.  One of these was just a TV news link regarding [facility] high-resolution imaging and deep brain stimulation.  He subsequently sent another e-mail regarding focused ultrasound.  Asks about updates regarding these and has 2 pages of questions.  Asks about why we can't give him human growth hormone or steroids for PD.  Overall, patient is clinically doing well.   02/21/14 update:   Pt returns for f/u, accompanied by his wife who supplements the history.  The records that were made available to me were reviewed.  Pt has had PPM since last visit due to bradycardia.  He has been told not to exercise until 12/9.  He will start with PWR moves classes after the first of the year as well as circuit 2 classes.  He is still  on carbidopa/levodopa 25/100 at 8-9am/12-1pm/6-7 pm.  He is c/o paresthesias/"neuropathy" in the R foot.  He only notices it with driving.   He has no back pain.  He has no sensation of balance loss with closing eyes in the shower.  He thinks that exposure to levodopa causes the neuropathy and brings with him a small study (58 pts) that suggests that levodopa could be an etiology for the neuropathy.  Notices lack of smell.  No falls.  Rare lightheadedness. No hallucinations.    05/23/14 update:  Pt is f/u today, accompanied by his wife who supplements the history.  He went to [facility] since our last visit and I reviewed those records.  He was told he had PN (known diagnosis) but no new meds were prescribed.  He asks about a "cure" for PN that a doctor in [place] was advertising.   He remains on carbidopa/levodopa 25/100 tid for the PD.  He is exercising faithfully.  He has trouble getting himself over 3 miles per hour when walking and he is concerned about that.  He asks again about taking steroids and human growth hormone.  He also asks about taking protein supplements.  He has d/c the glucosamine for joint pain/arthritis as he didn't think that it was helpful.    09/08/14 update:  The patient  is following up today.  He is accompanied by his wife who supplements the history.   Records were reviewed since last visit, from his primary care physician, podiatrist, as well as his cardiologist.  He remains on carbidopa/levodopa 25/100, one tablet 3 times per day.  He notices no wearing off.  He is doing PD circuit class.  He called his podiatrist since last visit and asked him about medications for neuropathy and he deferred to me.  We started gabapentin on May 30, 2014.  He is on 300 mg twice a day.  He states that his feet numbness is better.  He has continued to complain about fatigue.  His pacemaker was recently adjusted.  He is trying to walk/exercise at the park and his endurance has been better ever  since.  He again asks about energy.  He is back on testosterone for that.    01/12/15 update:  The patient returns today for follow-up, accompanied by his wife who supplements the history.  He remains on carbidopa/levodopa 25/100, one tablet 3 times per day.  Overall, the patient states that he is doing well.   Still has some intermittent tremor.  He denies any syncope.  He has had no falls since last visit.  He has had some near falls.  No hallucinations or visual distortions.  He is on gabapentin, 300 mg twice a day for peripheral neuropathy secondary to diabetes mellitus. Pt states that it is helping with the sx's, along with the "natural herb that I take."   He continues to faithfully exercise with the PWR classes.  Some lightheaded after exercise.  No new medical issues since last visit.    04/18/15 update:  The patient presents today, accompanied by his wife who supplements the history.  Last visit, increased the patient's carbidopa/levodopa 25/100, so that he is now taking 1-1/2 tablets 3 times per day.   When asked how he feel, he states, "I don't know how I am supposed to feel."  He denies stiffness.  He has minimal thumb tremor.   He is supposed to be on gabapentin, 300 mg twice a day for peripheral neuropathy but went down to q day because his feet weren't bothering him.  I have reviewed records made available to me since last visit.  He was diagnosed with atrial fibrillation and started on Xarelto.  He is doing well with that.  He denies any bleeding difficulties.  No falls.  No near syncope.  Rarely does he even feel unstable.  He is exercising.  Goes to the gym three times a week.    No neuroimaging since CT brain in 2009.  PREVIOUS MEDICATIONS: none to date  ALLERGIES:   Allergies  Allergen Reactions  . Penicillins Other (See Comments)    Unknown childhood reaction    CURRENT MEDICATIONS:  Current Outpatient Prescriptions on File Prior to Visit  Medication Sig Dispense Refill  .  acetaminophen (TYLENOL) 325 MG tablet Take 1-2 tablets (325-650 mg total) by mouth every 4 (four) hours as needed for mild pain.    . Alpha-Lipoic Acid 600 MG CAPS Take 1 capsule by mouth daily.    Marland Kitchen ALPRAZolam (XANAX) 0.25 MG tablet Take 1 tablet (0.25 mg total) by mouth 2 (two) times daily as needed. (Patient taking differently: Take 0.25 mg by mouth daily. ) 30 tablet 2  . B Complex Vitamins (B COMPLEX 50) TABS Take 1 tablet by mouth every morning.     . carbidopa-levodopa (SINEMET IR) 25-100 MG  tablet Take 1.5 tablets by mouth 3 (three) times daily before meals. 405 tablet 1  . empagliflozin (JARDIANCE) 25 MG TABS tablet Take 25 mg by mouth daily. 90 tablet 1  . fish oil-omega-3 fatty acids 1000 MG capsule Take 2 g by mouth daily.    . folic acid (FOLVITE) 1 MG tablet Take 1 tablet (1 mg total) by mouth every morning. 90 tablet 1  . gabapentin (NEURONTIN) 300 MG capsule Take 1 capsule (300 mg total) by mouth 2 (two) times daily. 60 capsule 5  . hydrocortisone 0.5 % cream Apply 1 application topically 2 (two) times daily as needed. rash    . ibuprofen (ADVIL,MOTRIN) 200 MG tablet Take 200 mg by mouth daily as needed for mild pain.     Marland Kitchen JARDIANCE 25 MG TABS tablet TAKE 1 TABLET DAILY 90 tablet 1  . levothyroxine (SYNTHROID, LEVOTHROID) 50 MCG tablet take 1 tablet by mouth once daily BEFORE BREAKFAST 90 tablet 3  . mirabegron ER (MYRBETRIQ) 25 MG TB24 tablet Take 25 mg by mouth daily.    Vladimir Faster Glycol-Propyl Glycol (SYSTANE ULTRA) 0.4-0.3 % SOLN Place 1 drop into both eyes daily as needed (dry eyes).     . rivaroxaban (XARELTO) 20 MG TABS tablet Take 1 tablet (20 mg total) by mouth daily with supper. 90 tablet 1  . sertraline (ZOLOFT) 25 MG tablet Take 25 mg by mouth every morning.    . simvastatin (ZOCOR) 20 MG tablet take 1 tablet by mouth every evening 90 tablet 1  . tadalafil (CIALIS) 5 MG tablet Take 5 mg by mouth every morning.     . tamsulosin (FLOMAX) 0.4 MG CAPS capsule Take 1  capsule (0.4 mg total) by mouth every morning. 90 capsule 3  . Testosterone (AXIRON) 30 MG/ACT SOLN PLACE 2 APPLICATORFULS ONTO THE SKIN ONCE DAILY 90 mL 1  . vitamin B-12 (CYANOCOBALAMIN) 1000 MCG tablet Take 1,000 mcg by mouth every morning.      No current facility-administered medications on file prior to visit.    PAST MEDICAL HISTORY:   Past Medical History  Diagnosis Date  . Hyperlipidemia   . Hemorrhoids   . Hypothyroidism   . Asymptomatic bilateral carotid artery stenosis     per duplex  05-15-2012  left >39%/   right 40-59%  . Bradycardia   . Fatigue   . Dizzy   . BPH (benign prostatic hypertrophy) with urinary obstruction   . NSVT (nonsustained ventricular tachycardia) [facility])     cardiologist-  dr croitoru  . Chronotropic incompetence with sinus node dysfunction (HCC)   . Borderline diabetes   . GERD (gastroesophageal reflux disease)     occasional  . Compression of lumbar vertebra (HCC)     L4 -- L5  . Compressed cervical disc   . Frequency of urination   . Urgency of urination   . Nocturia   . Wears glasses   . Pacemaker   . Bronchial pneumonia 1958  . Dysmetabolic syndrome   . Arthritis     "joints; a little" (01/04/2014)  . Basal cell carcinoma     nose    PAST SURGICAL HISTORY:   Past Surgical History  Procedure Laterality Date  . Neuroplasty / transposition ulnar nerve at elbow Left 02-09-2010  . Closed reduction nasal fracture  09-01-2007  . Exercise tolerence test  12-03-2012  DR CROITURO    CHRONOTROPIC INCOMPETENCE/ NORMAL RESTING BP W/ APPROPRIATE RESPONSE/ NO CHEST PAIN/ NO ST CHANGES FROM BASELINE  .  Laceration repair Right 1978    middle finger  . Tonsillectomy and adenoidectomy  1944  . Cystoscopy N/A 12/28/2012    Procedure: CYSTOSCOPY;  Surgeon: Bernestine Amass, MD;  Location: [facility];  Service: Urology;  Laterality: N/A;  . Transurethral incision of prostate N/A 12/28/2012    Procedure: TRANSURETHRAL INCISION OF THE  PROSTATE (TUIP);  Surgeon: Bernestine Amass, MD;  Location: [facility];  Service: Urology;  Laterality: N/A;  . Ulnar nerve transposition    . Insert / replace / remove pacemaker  01/04/2014    [employer] model L121 serial number [id number]  . Basal cell carcinoma excision      nose  . Permanent pacemaker insertion N/A 01/04/2014    Procedure: PERMANENT PACEMAKER INSERTION;  Surgeon: Sanda Klein, MD;  Location: [place] [facility];  Service: Cardiovascular;  Laterality: N/A;    SOCIAL HISTORY:   Social History   Social History  . Marital Status: Married    Spouse Name: N/A  . Number of Children: 0  . Years of Education: N/A   Occupational History  . retired     Programme researcher, broadcasting/film/video    Social History Main Topics  . Smoking status: Former Smoker -- 1.00 packs/day for 10 years    Types: Cigarettes    Quit date: 03/25/1968  . Smokeless tobacco: Never Used  . Alcohol Use: 4.2 oz/week    7 Glasses of wine per week     Comment: 1 glass wine daily  . Drug Use: No  . Sexual Activity: Not Currently   Other Topics Concern  . Not on file   Social History Narrative   Regular exercise          FAMILY HISTORY:   Family Status  Relation Status Death Age  . Father Deceased     stroke  . Mother Deceased     lung cancer  . Brother Alive     healthy    ROS:    A complete 10 system review of systems was obtained and was unremarkable apart from what is mentioned above.  PHYSICAL EXAMINATION:    VITALS:   Filed Vitals:   04/18/15 1041  BP: 130/72  Pulse: 90  Height: 5\' 9"  (1.753 m)  Weight: 179 lb (81.194 kg)    GEN:  The patient appears stated age and is in NAD. HEENT:  [place]/AT.  MMM CV:  RRR (no afib today) Lungs:  CTAB Neck:  No bruits   Neurological examination:  Orientation: The patient is alert and oriented x3. Fund of knowledge is appropriate.  Recent and remote memory are intact.  Cranial nerves: There is good facial symmetry. Mild facial  hypomimia.  Pupils are equal round and reactive to light bilaterally. Fundoscopic exam reveals clear margins bilaterally. Extraocular muscles are intact. The visual fields are full to confrontational testing. The speech is fluent and clear.  Soft palate rises symmetrically and there is no tongue deviation. Hearing is intact to conversational tone. Motor: Strength is 5/5 in the bilateral upper and lower extremities.   Shoulder shrug is equal and symmetric.  There is no pronator drift. Sensation:  Intact to light touch.  More sensitive aspects not tested today  Movement examination: Tone: There is no rigidity in the UE/LE Abnormal movements: There is intermittent LUE resting tremor Coordination:  There are decreased RAM's only in the feet and present with toe taps bilaterally Gait and Station: The patient has no difficulty arising out of a  deep-seated chair without the use of the hands. The patient's stride length is fairly normal with just slight decreased arm swing on the L.  Negative pull test.      LABS:  Lab Results  Component Value Date   TSH 2.80 06/16/2014   Lab Results  Component Value Date   VITAMINB12 872 06/17/2013     Chemistry      Component Value Date/Time   NA 136 06/16/2014 1351   K 4.1 06/16/2014 1351   CL 102 06/16/2014 1351   CO2 30 06/16/2014 1351   BUN 13 06/16/2014 1351   CREATININE 0.73 06/16/2014 1351   CREATININE 0.77 12/29/2013 1627      Component Value Date/Time   CALCIUM 9.4 06/16/2014 1351   ALKPHOS 41 06/16/2014 1351   AST 32 06/16/2014 1351   ALT 9 06/16/2014 1351   BILITOT 1.0 06/16/2014 1351     RPR negative Lab Results  Component Value Date   FOLATE >20.0 06/17/2013     Lab Results  Component Value Date   HGBA1C 6.0 11/29/2014    ASSESSMENT/PLAN:  1. idiopathic Parkinson's disease.  I see no atypical features.    -He will continue the carbidopa/levodopa 25/100, 1.5 tablets 3 times daily. He looked better on this   -brought a long  list of questions and answered them to the best of my ability. 2.  Peripheral neuropathy  -There is evidence of this on examination.  Likely from DM.  His workup for reversible causes was negative.  His diabetes is under much better control than in the past.  -improved on gabapentin 300 mg and only taking qhs now.   3.  Hx of transaminasemia  -These were elevated in 2012 but have improved 4.  R carotid bruit with hx of carotid stenosis  -04/2012 u/s with 40-50% stenosis on the R.  this was rechecked in 2015 and was 1-39% bilaterally. 5. Follow up is anticipated in the next few months, sooner should new neurologic issues arise.

## 2015-05-10 ENCOUNTER — Ambulatory Visit (INDEPENDENT_AMBULATORY_CARE_PROVIDER_SITE_OTHER): Payer: Medicare Other | Admitting: Podiatry

## 2015-05-10 ENCOUNTER — Encounter: Payer: Self-pay | Admitting: Podiatry

## 2015-05-10 ENCOUNTER — Ambulatory Visit (INDEPENDENT_AMBULATORY_CARE_PROVIDER_SITE_OTHER): Payer: Medicare Other

## 2015-05-10 VITALS — BP 113/67 | HR 69 | Resp 16

## 2015-05-10 DIAGNOSIS — E1140 Type 2 diabetes mellitus with diabetic neuropathy, unspecified: Secondary | ICD-10-CM

## 2015-05-10 DIAGNOSIS — M79671 Pain in right foot: Secondary | ICD-10-CM

## 2015-05-10 DIAGNOSIS — G20 Parkinson's disease: Secondary | ICD-10-CM

## 2015-05-10 DIAGNOSIS — M79672 Pain in left foot: Secondary | ICD-10-CM

## 2015-05-10 DIAGNOSIS — G20A1 Parkinson's disease without dyskinesia, without mention of fluctuations: Secondary | ICD-10-CM

## 2015-05-10 NOTE — Progress Notes (Signed)
Subjective:     Patient ID: Stephen Robbins, male   DOB: [birth year], 80 y.o.   MRN: [id number]  HPI patient presents concerned that he's had some sensitivity in his feet and that he has Parkinson's and his neurologist wants to place him on gabapentin   Review of Systems     Objective:   Physical Exam  neurovascular status was found to be intact with muscle strength adequate range of motion within normal limits with patient having mild symptoms consistent with low grade neuropathy    Assessment:      low grade neuropathy which is probably due more to Parkinson's and low level diabetes than anything else at the current time    Plan:      H&P and x-rays reviewed and advised that he should pursue with his neurologist gabapentin as that will be the best source to treat this. Patient will be seen back by me as needed but I do not recommend any aggressive treatments for his feet at the current time   AP lateral x-ray views indicated spur formation but no indications of stress fracture or other pathology currently

## 2015-05-19 ENCOUNTER — Other Ambulatory Visit: Payer: Self-pay | Admitting: Internal Medicine

## 2015-05-30 ENCOUNTER — Telehealth: Payer: Self-pay | Admitting: Internal Medicine

## 2015-05-30 ENCOUNTER — Ambulatory Visit (INDEPENDENT_AMBULATORY_CARE_PROVIDER_SITE_OTHER): Payer: Medicare Other | Admitting: Internal Medicine

## 2015-05-30 ENCOUNTER — Encounter: Payer: Medicare Other | Admitting: Cardiovascular Disease

## 2015-05-30 ENCOUNTER — Encounter: Payer: Self-pay | Admitting: Internal Medicine

## 2015-05-30 VITALS — BP 120/70 | HR 82 | Temp 98.2°F | Resp 20 | Ht 69.0 in | Wt 179.0 lb

## 2015-05-30 DIAGNOSIS — E1142 Type 2 diabetes mellitus with diabetic polyneuropathy: Secondary | ICD-10-CM | POA: Diagnosis not present

## 2015-05-30 DIAGNOSIS — I480 Paroxysmal atrial fibrillation: Secondary | ICD-10-CM | POA: Diagnosis not present

## 2015-05-30 DIAGNOSIS — Z950 Presence of cardiac pacemaker: Secondary | ICD-10-CM

## 2015-05-30 LAB — HEMOGLOBIN A1C: Hgb A1c MFr Bld: 6.4 % (ref 4.6–6.5)

## 2015-05-30 NOTE — Progress Notes (Signed)
Pre visit review using our clinic review tool, if applicable. No additional management support is needed unless otherwise documented below in the visit note. 

## 2015-05-30 NOTE — Patient Instructions (Signed)
Limit your sodium (Salt) intake    It is important that you exercise regularly, at least 20 minutes 3 to 4 times per week.  If you develop chest pain or shortness of breath seek  medical attention.   Please check your hemoglobin A1c every 3-6  months  

## 2015-05-30 NOTE — Progress Notes (Signed)
Subjective:    Patient ID: Stephen Robbins, male    DOB: 11/23/1935, 80 y.o.   MRN: [id number]  HPI  80 year old patient who is seen today for his biannual follow-up.  He is followed by cardiology and since his last visit here has been diagnosed with paroxysmal atrial fibrillation.  He has Parkinson's disease and is followed by neurology.  He is doing quite well. He has type 2 diabetes, CAD by a mild peripheral neuropathy.  In [place] and A1c's have generally been in a nondiabetic range, on oral therapy. He has hypothyroidism and testosterone deficiency.  No new concerns or complaints.  Lab Results  Component Value Date   HGBA1C 6.0 11/29/2014   He and his wife both be transferring to an independent retirement home and require a paperwork for completion  Past Medical History  Diagnosis Date  . Hyperlipidemia   . Hemorrhoids   . Hypothyroidism   . Asymptomatic bilateral carotid artery stenosis     per duplex  05-15-2012  left >39%/   right 40-59%  . Bradycardia   . Fatigue   . Dizzy   . BPH (benign prostatic hypertrophy) with urinary obstruction   . NSVT (nonsustained ventricular tachycardia) [facility])     cardiologist-  dr croitoru  . Chronotropic incompetence with sinus node dysfunction (HCC)   . Borderline diabetes   . GERD (gastroesophageal reflux disease)     occasional  . Compression of lumbar vertebra (HCC)     L4 -- L5  . Compressed cervical disc   . Frequency of urination   . Urgency of urination   . Nocturia   . Wears glasses   . Pacemaker   . Bronchial pneumonia 1958  . Dysmetabolic syndrome   . Arthritis     "joints; a little" (01/04/2014)  . Basal cell carcinoma     nose    Social History   Social History  . Marital Status: Married    Spouse Name: N/A  . Number of Children: 0  . Years of Education: N/A   Occupational History  . retired     Programme researcher, broadcasting/film/video    Social History Main Topics  . Smoking status: Former Smoker -- 1.00 packs/day for 10 years   Types: Cigarettes    Quit date: 03/25/1968  . Smokeless tobacco: Never Used  . Alcohol Use: 4.2 oz/week    7 Glasses of wine per week     Comment: 1 glass wine daily  . Drug Use: No  . Sexual Activity: Not Currently   Other Topics Concern  . Not on file   Social History Narrative   Regular exercise          Past Surgical History  Procedure Laterality Date  . Neuroplasty / transposition ulnar nerve at elbow Left 02-09-2010  . Closed reduction nasal fracture  09-01-2007  . Exercise tolerence test  12-03-2012  DR CROITURO    CHRONOTROPIC INCOMPETENCE/ NORMAL RESTING BP W/ APPROPRIATE RESPONSE/ NO CHEST PAIN/ NO ST CHANGES FROM BASELINE  . Laceration repair Right 1978    middle finger  . Tonsillectomy and adenoidectomy  1944  . Cystoscopy N/A 12/28/2012    Procedure: CYSTOSCOPY;  Surgeon: Bernestine Amass, MD;  Location: [facility];  Service: Urology;  Laterality: N/A;  . Transurethral incision of prostate N/A 12/28/2012    Procedure: TRANSURETHRAL INCISION OF THE PROSTATE (TUIP);  Surgeon: Bernestine Amass, MD;  Location: [facility];  Service: Urology;  Laterality: N/A;  .  Ulnar nerve transposition    . Insert / replace / remove pacemaker  01/04/2014    [employer] model L121 serial number [id number]  . Basal cell carcinoma excision      nose  . Permanent pacemaker insertion N/A 01/04/2014    Procedure: PERMANENT PACEMAKER INSERTION;  Surgeon: Sanda Klein, MD;  Location: [place] CATH [facility];  Service: Cardiovascular;  Laterality: N/A;    Family History  Problem Relation Age of Onset  . Stroke Father   . Lung cancer Mother     Allergies  Allergen Reactions  . Penicillins Other (See Comments)    Unknown childhood reaction    Current Outpatient Prescriptions on File Prior to Visit  Medication Sig Dispense Refill  . acetaminophen (TYLENOL) 325 MG tablet Take 1-2 tablets (325-650 mg total) by mouth every 4 (four) hours as needed for mild  pain.    . Alpha-Lipoic Acid 600 MG CAPS Take 1 capsule by mouth daily.    Marland Kitchen ALPRAZolam (XANAX) 0.25 MG tablet Take 1 tablet (0.25 mg total) by mouth 2 (two) times daily as needed. (Patient taking differently: Take 0.25 mg by mouth daily. ) 30 tablet 2  . B Complex Vitamins (B COMPLEX 50) TABS Take 1 tablet by mouth every morning.     . carbidopa-levodopa (SINEMET IR) 25-100 MG tablet Take 1.5 tablets by mouth 3 (three) times daily before meals. 405 tablet 1  . empagliflozin (JARDIANCE) 25 MG TABS tablet Take 25 mg by mouth daily. 90 tablet 1  . fish oil-omega-3 fatty acids 1000 MG capsule Take 2 g by mouth daily.    Marland Kitchen gabapentin (NEURONTIN) 300 MG capsule Take 1 capsule (300 mg total) by mouth 2 (two) times daily. 60 capsule 5  . hydrocortisone 0.5 % cream Apply 1 application topically 2 (two) times daily as needed. rash    . ibuprofen (ADVIL,MOTRIN) 200 MG tablet Take 200 mg by mouth daily as needed for mild pain.     Marland Kitchen levothyroxine (SYNTHROID, LEVOTHROID) 50 MCG tablet take 1 tablet by mouth once daily BEFORE BREAKFAST 90 tablet 3  . mirabegron ER (MYRBETRIQ) 25 MG TB24 tablet Take 25 mg by mouth daily.    Vladimir Faster Glycol-Propyl Glycol (SYSTANE ULTRA) 0.4-0.3 % SOLN Place 1 drop into both eyes daily as needed (dry eyes).     . rivaroxaban (XARELTO) 20 MG TABS tablet Take 1 tablet (20 mg total) by mouth daily with supper. 90 tablet 1  . sertraline (ZOLOFT) 25 MG tablet Take 25 mg by mouth every morning.    . sertraline (ZOLOFT) 25 MG tablet take 1 tablet by mouth once daily 90 tablet 1  . simvastatin (ZOCOR) 20 MG tablet take 1 tablet by mouth every evening 90 tablet 1  . tadalafil (CIALIS) 5 MG tablet Take 5 mg by mouth every morning.     . tamsulosin (FLOMAX) 0.4 MG CAPS capsule Take 1 capsule (0.4 mg total) by mouth every morning. 90 capsule 3  . Testosterone (AXIRON) 30 MG/ACT SOLN PLACE 2 APPLICATORFULS ONTO THE SKIN ONCE DAILY 90 mL 1  . vitamin B-12 (CYANOCOBALAMIN) 1000 MCG tablet  Take 1,000 mcg by mouth every morning.     . folic acid (FOLVITE) 1 MG tablet Take 1 tablet (1 mg total) by mouth every morning. (Patient not taking: Reported on 05/30/2015) 90 tablet 1   No current facility-administered medications on file prior to visit.    BP 120/70 mmHg  Pulse 82  Temp(Src) 98.2 F (36.8 C) (Oral)  Resp 20  Ht 5\' 9"  (1.753 m)  Wt 179 lb (81.194 kg)  BMI 26.42 kg/m2  SpO2 97%     Review of Systems  Constitutional: Negative for fever, chills, appetite change and fatigue.  HENT: Negative for congestion, dental problem, ear pain, hearing loss, sore throat, tinnitus, trouble swallowing and voice change.   Eyes: Negative for pain, discharge and visual disturbance.  Respiratory: Negative for cough, chest tightness, wheezing and stridor.   Cardiovascular: Negative for chest pain, palpitations and leg swelling.  Gastrointestinal: Negative for nausea, vomiting, abdominal pain, diarrhea, constipation, blood in stool and abdominal distention.  Genitourinary: Negative for urgency, hematuria, flank pain, discharge, difficulty urinating and genital sores.  Musculoskeletal: Positive for neck pain and neck stiffness. Negative for myalgias, back pain, joint swelling, arthralgias and gait problem.  Skin: Negative for rash.  Neurological: Negative for dizziness, syncope, speech difficulty, weakness, numbness and headaches.  Hematological: Negative for adenopathy. Does not bruise/bleed easily.  Psychiatric/Behavioral: Negative for behavioral problems and dysphoric mood. The patient is not nervous/anxious.        Objective:   Physical Exam  Constitutional: He is oriented to person, place, and time. He appears well-developed.  HENT:  Head: Normocephalic.  Right Ear: External ear normal.  Left Ear: External ear normal.  Eyes: Conjunctivae and EOM are normal.  Neck: Normal range of motion.  Cardiovascular: Normal rate and normal heart sounds.   Right 90 and regular    Pulmonary/Chest: Breath sounds normal.  Abdominal: Bowel sounds are normal.  Musculoskeletal: Normal range of motion. He exhibits no edema or tenderness.  Neurological: He is alert and oriented to person, place, and time.  Psychiatric: He has a normal mood and affect. His behavior is normal.          Assessment & Plan:  Paroxysmal atrial fibrillation.  Continue anticoagulation Parkinson's disease.  Follow-up neurology Osteoarthritis Diabetes mellitus.  Well-controlled.  We'll check a hemoglobin A1c   Recheck 6 months

## 2015-05-30 NOTE — Telephone Encounter (Signed)
Pt would like to pick up the forms he left for Dr Raliegh Ip to fill out this am for he and his wife. He had previously asked [place] to fax, but no longer wants [place] to do that. Please call when ready.

## 2015-06-02 ENCOUNTER — Ambulatory Visit (INDEPENDENT_AMBULATORY_CARE_PROVIDER_SITE_OTHER): Payer: Medicare Other | Admitting: Family Medicine

## 2015-06-02 ENCOUNTER — Encounter: Payer: Self-pay | Admitting: Family Medicine

## 2015-06-02 VITALS — BP 116/58 | HR 80 | Temp 99.8°F | Ht 69.0 in | Wt 180.0 lb

## 2015-06-02 DIAGNOSIS — J209 Acute bronchitis, unspecified: Secondary | ICD-10-CM | POA: Diagnosis not present

## 2015-06-02 MED ORDER — AZITHROMYCIN 250 MG PO TABS: ORAL_TABLET | ORAL | Status: DC

## 2015-06-02 MED ORDER — HYDROCODONE-HOMATROPINE 5-1.5 MG/5ML PO SYRP: 5.0000 mL | ORAL_SOLUTION | ORAL | Status: DC | PRN

## 2015-06-02 NOTE — Telephone Encounter (Signed)
Spoke to pt, told him forms are complete and ready for pickup. Pt verbalized understanding.

## 2015-06-02 NOTE — Progress Notes (Signed)
   Subjective:    Patient ID: Stephen Robbins, male    DOB: [birth year], 80 y.o.   MRN: [id number]  HPI Here for 2 days of stuffy head, PND, chest congestion and a dry cough. Drinking fluids.   Review of Systems  Constitutional: Negative.   HENT: Positive for congestion and postnasal drip. Negative for sinus pressure and sore throat.   Eyes: Negative.   Respiratory: Positive for cough.        Objective:   Physical Exam  Constitutional: He appears well-developed and well-nourished.  HENT:  Right Ear: External ear normal.  Left Ear: External ear normal.  Nose: Nose normal.  Mouth/Throat: Oropharynx is clear and moist.  Eyes: Conjunctivae are normal.  Pulmonary/Chest: Effort normal. No respiratory distress. He has no wheezes. He has no rales.  Scattered rhonchi   Lymphadenopathy:    He has no cervical adenopathy.          Assessment & Plan:  Bronchitis, treat with a Zpack

## 2015-06-02 NOTE — Progress Notes (Signed)
Pre visit review using our clinic review tool, if applicable. No additional management support is needed unless otherwise documented below in the visit note. 

## 2015-06-13 ENCOUNTER — Encounter: Payer: Medicare Other | Admitting: Cardiovascular Disease

## 2015-06-16 ENCOUNTER — Encounter: Payer: Medicare Other | Admitting: Cardiovascular Disease

## 2015-06-19 ENCOUNTER — Encounter: Payer: Medicare Other | Admitting: Cardiovascular Disease

## 2015-06-20 ENCOUNTER — Ambulatory Visit: Payer: Medicare Other | Attending: Internal Medicine | Admitting: Physical Therapy

## 2015-06-20 ENCOUNTER — Ambulatory Visit: Payer: Medicare Other | Admitting: Occupational Therapy

## 2015-06-20 ENCOUNTER — Ambulatory Visit: Payer: Medicare Other

## 2015-06-20 DIAGNOSIS — R471 Dysarthria and anarthria: Secondary | ICD-10-CM

## 2015-06-20 DIAGNOSIS — R278 Other lack of coordination: Secondary | ICD-10-CM | POA: Insufficient documentation

## 2015-06-20 DIAGNOSIS — R293 Abnormal posture: Secondary | ICD-10-CM | POA: Insufficient documentation

## 2015-06-20 NOTE — Therapy (Signed)
[facility] [address] [place], [name], [address] Phone: [phone]   Fax:  [phone]  Patient Details  Name: Stephen Robbins MRN: [id number] Date of Birth: [birth year] Referring Provider:  Marletta Lor, MD  Encounter Date: 06/20/2015  Physical Therapy Parkinson's Disease Screen   Timed Up and Go test:10.08 sec  10 meter walk test:10.08 sec (3.25 ft/sec)  5 time sit to stand test:12.38 sec    Patient does not require Physical Therapy services at this time.  Recommend Physical Therapy screen in 6-9 months.  Pt had one fall about a month ago, with difficulty getting up from the floor.  He does not know why he fell.  MARRIOTT,AMY W. 06/20/2015, 1:16 PM  [facility] [address] [place], [name], [address] Phone: [phone]   Fax:  [phone]

## 2015-06-20 NOTE — Therapy (Signed)
[facility] [address] [place], [name], [address] Phone: [phone]   Fax:  [phone]  Patient Details  Name: Stephen Robbins MRN: [id number] Date of Birth: 1935/09/29 Referring Provider:  Marletta Lor, MD  Encounter Date: 06/20/2015  Speech Therapy Parkinson's Disease Screen   Decibel Level today: 71dB  (WNL=70-72 dB) with sound level meter 30cm away from pt's mouth. Pt has not experienced difficulty in swallowing warranting objective evaluation.  Pt does does not require speech therapy services at this time. Recommend ST screen in another 6 months, when pt may opt to have a short course of ST targeting initial ST concepts for a person with PD.   Garald Balding ,MS, CCC-SLP 06/20/2015, 1:53 PM  [facility] [address] [place], [name], [address] Phone: [phone]   Fax:  [phone]

## 2015-06-20 NOTE — Therapy (Signed)
[facility] [address] [place], [name], [address] Phone: [phone]   Fax:  [phone]  Patient Details  Name: Stephen Robbins MRN: [id number] Date of Birth: [birth year] Referring Provider:  Marletta Lor, MD  Encounter Date: 06/20/2015  Occupational Therapy Parkinson's Disease Screen  Physical Performance Test item #4 (donning/doffing jacket):  8.53sec  9-hole peg test:    RUE  30.38 sec        LUE  30.84 sec (L-handed)  Box & Blocks Test:   RUE  45 blocks        LUE  45blocks  Change in ability to perform ADLs/IADLs:  No  Comments:  Pt instructed in functional changes during ADLs to be aware of and pt verbalized understanding.   Pt does not require occupation therapy services at this time.  Recommended occupational therapy screen in   approx 6 months    [facility] 06/20/2015, 1:30 PM  [facility] [address] [place], [name], [address] Phone: [phone]   Fax:  [place], OTR/L [facility] [address] [place], [place]  [address] [phone] phone [phone] 06/20/2015 2:23 PM

## 2015-06-21 ENCOUNTER — Ambulatory Visit (INDEPENDENT_AMBULATORY_CARE_PROVIDER_SITE_OTHER): Payer: Medicare Other | Admitting: Cardiovascular Disease

## 2015-06-21 ENCOUNTER — Encounter: Payer: Self-pay | Admitting: Cardiovascular Disease

## 2015-06-21 VITALS — BP 94/59 | HR 67 | Ht 69.0 in | Wt 174.0 lb

## 2015-06-21 DIAGNOSIS — Z950 Presence of cardiac pacemaker: Secondary | ICD-10-CM

## 2015-06-21 DIAGNOSIS — I4729 Other ventricular tachycardia: Secondary | ICD-10-CM

## 2015-06-21 DIAGNOSIS — I495 Sick sinus syndrome: Secondary | ICD-10-CM

## 2015-06-21 DIAGNOSIS — I472 Ventricular tachycardia: Secondary | ICD-10-CM | POA: Diagnosis not present

## 2015-06-21 DIAGNOSIS — I480 Paroxysmal atrial fibrillation: Secondary | ICD-10-CM | POA: Diagnosis not present

## 2015-06-21 NOTE — Patient Instructions (Signed)
Your physician recommends that you continue on your current medications as directed. Please refer to the Current Medication list given to you today.  Remote monitoring is used to monitor your Pacemaker of ICD from home. This monitoring reduces the number of office visits required to check your device to one time per year. It allows [place] to keep an eye on the functioning of your device to ensure it is working properly. You are scheduled for a device check from home on September 22, 2015. You may send your transmission at any time that day. If you have a wireless device, the transmission will be sent automatically. After your physician reviews your transmission, you will receive a postcard with your next transmission date.  Dr Sallyanne Kuster recommends that you schedule a follow-up appointment in 12 months with a device check. You will receive a reminder letter in the mail two months in advance. If you don't receive a letter, please call our office to schedule the follow-up appointment.  If you need a refill on your cardiac medications before your next appointment, please call your pharmacy.

## 2015-06-21 NOTE — Progress Notes (Signed)
Patient ID: Stephen Robbins, male   DOB: [birth year], 80 y.o.   MRN: [id number]    Cardiology Office Note    Date:  06/22/2015   ID:  Stephen Robbins, DOB 07/05/1935, MRN [id number]  PCP:  Nyoka Cowden, MD  Cardiologist:   Sanda Klein, MD   Chief Complaint  Patient presents with  . Follow-up    no chest pain, no shortness of breath, no edema, no pain or cramping in legs, occassional lightheaded or dizziness    History of Present Illness:  [name] is a 80 y.o. male with asymptomatic paroxysmal atrial fibrillation detected on pacemaker downloads, sinus node dysfunction with chronotropic incompetence, Parkinson's disease, diabetes mellitus, asymptomatic brief nonsustained VT detected on pacemaker.  His device is a Catering manager implanted in 2015. Device check today shows normal function. Estimated generator longevity is 14.5 years. There is 48% atrial pacing and only 4% ventricular pacing. After multiple adjustments in his sensor settings we finally have appropriate heart rate histogram distribution and this is reflected and better functional status, more energy when he performs physical therapy/gymnastics for Parkinson's. Since his last appointment he has had one more episode of asymptomatic atrial fibrillation lasting 3 hours and 22 minutes on March 4. He was unaware of the arrhythmia. This is his third episode of atrial fibrillation detected since pacemaker implantation. The overall burden of atrial fibrillation remains less than 1%. Rate control during atrial fibrillation is appropriate.No new episodes of nonsustained VT are seen.  He continues to have complaints of orthostatic dizziness that is generally mild. He denies angina, dyspnea, palpitations, syncope. He has not had any bleeding problems and is compliant with anticoagulation.    Past Medical History  Diagnosis Date  . Hyperlipidemia   . Hemorrhoids   . Hypothyroidism   . Asymptomatic  bilateral carotid artery stenosis     per duplex  05-15-2012  left >39%/   right 40-59%  . Bradycardia   . Fatigue   . Dizzy   . BPH (benign prostatic hypertrophy) with urinary obstruction   . NSVT (nonsustained ventricular tachycardia) [facility])     cardiologist-  dr Indalecio Malmstrom  . Chronotropic incompetence with sinus node dysfunction (HCC)   . Borderline diabetes   . GERD (gastroesophageal reflux disease)     occasional  . Compression of lumbar vertebra (HCC)     L4 -- L5  . Compressed cervical disc   . Frequency of urination   . Urgency of urination   . Nocturia   . Wears glasses   . Pacemaker   . Bronchial pneumonia 1958  . Dysmetabolic syndrome   . Arthritis     "joints; a little" (01/04/2014)  . Basal cell carcinoma     nose    Past Surgical History  Procedure Laterality Date  . Neuroplasty / transposition ulnar nerve at elbow Left 02-09-2010  . Closed reduction nasal fracture  09-01-2007  . Exercise tolerence test  12-03-2012  DR CROITURO    CHRONOTROPIC INCOMPETENCE/ NORMAL RESTING BP W/ APPROPRIATE RESPONSE/ NO CHEST PAIN/ NO ST CHANGES FROM BASELINE  . Laceration repair Right 1978    middle finger  . Tonsillectomy and adenoidectomy  1944  . Cystoscopy N/A 12/28/2012    Procedure: CYSTOSCOPY;  Surgeon: Bernestine Amass, MD;  [facility];  Service: Urology;  Laterality: N/A;  . Transurethral incision of prostate N/A 12/28/2012    Procedure: TRANSURETHRAL INCISION OF THE PROSTATE (TUIP);  Surgeon: Bernestine Amass, MD;  Location: [facility];  Service: Urology;  Laterality: N/A;  . Ulnar nerve transposition    . Insert / replace / remove pacemaker  01/04/2014    [employer] model L121 serial number [id number]  . Basal cell carcinoma excision      nose  . Permanent pacemaker insertion N/A 01/04/2014    Procedure: PERMANENT PACEMAKER INSERTION;  Surgeon: Sanda Klein, MD;  Location: [facility];  Service: Cardiovascular;   Laterality: N/A;    Current Medications: Outpatient Prescriptions Prior to Visit  Medication Sig Dispense Refill  . Alpha-Lipoic Acid 600 MG CAPS Take 1 capsule by mouth daily.    Marland Kitchen ALPRAZolam (XANAX) 0.25 MG tablet Take 1 tablet (0.25 mg total) by mouth 2 (two) times daily as needed. (Patient taking differently: Take 0.25 mg by mouth daily. ) 30 tablet 2  . B Complex Vitamins (B COMPLEX 50) TABS Take 1 tablet by mouth every morning.     . carbidopa-levodopa (SINEMET IR) 25-100 MG tablet Take 1.5 tablets by mouth 3 (three) times daily before meals. 405 tablet 1  . empagliflozin (JARDIANCE) 25 MG TABS tablet Take 25 mg by mouth daily. 90 tablet 1  . fish oil-omega-3 fatty acids 1000 MG capsule Take 2 g by mouth daily.    Marland Kitchen gabapentin (NEURONTIN) 300 MG capsule Take 1 capsule (300 mg total) by mouth 2 (two) times daily. 60 capsule 5  . hydrocortisone 0.5 % cream Apply 1 application topically 2 (two) times daily as needed. rash    . ibuprofen (ADVIL,MOTRIN) 200 MG tablet Take 200 mg by mouth daily as needed for mild pain.     Marland Kitchen levothyroxine (SYNTHROID, LEVOTHROID) 50 MCG tablet take 1 tablet by mouth once daily BEFORE BREAKFAST 90 tablet 3  . mirabegron ER (MYRBETRIQ) 25 MG TB24 tablet Take 25 mg by mouth daily.    Vladimir Faster Glycol-Propyl Glycol (SYSTANE ULTRA) 0.4-0.3 % SOLN Place 1 drop into both eyes daily as needed (dry eyes).     . rivaroxaban (XARELTO) 20 MG TABS tablet Take 1 tablet (20 mg total) by mouth daily with supper. 90 tablet 1  . sertraline (ZOLOFT) 25 MG tablet Take 25 mg by mouth every morning. Reported on 06/02/2015    . simvastatin (ZOCOR) 20 MG tablet take 1 tablet by mouth every evening 90 tablet 1  . tadalafil (CIALIS) 5 MG tablet Take 5 mg by mouth every morning.     . tamsulosin (FLOMAX) 0.4 MG CAPS capsule Take 1 capsule (0.4 mg total) by mouth every morning. 90 capsule 3  . Testosterone (AXIRON) 30 MG/ACT SOLN PLACE 2 APPLICATORFULS ONTO THE SKIN ONCE DAILY 90 mL 1    . vitamin B-12 (CYANOCOBALAMIN) 1000 MCG tablet Take 1,000 mcg by mouth every morning.     Marland Kitchen acetaminophen (TYLENOL) 325 MG tablet Take 1-2 tablets (325-650 mg total) by mouth every 4 (four) hours as needed for mild pain. (Patient not taking: Reported on 06/21/2015)    . azithromycin (ZITHROMAX Z-PAK) 250 MG tablet As directed (Patient not taking: Reported on 123456) 6 each 0  . folic acid (FOLVITE) 1 MG tablet Take 1 tablet (1 mg total) by mouth every morning. (Patient not taking: Reported on 05/30/2015) 90 tablet 1  . HYDROcodone-homatropine (HYDROMET) 5-1.5 MG/5ML syrup Take 5 mLs by mouth every 4 (four) hours as needed. (Patient not taking: Reported on 06/21/2015) 240 mL 0  . sertraline (ZOLOFT) 25 MG tablet take 1 tablet by mouth once daily (Patient not taking:  Reported on 06/02/2015) 90 tablet 1   No facility-administered medications prior to visit.     Allergies:   Penicillins   Social History   Social History  . Marital Status: Married    Spouse Name: N/A  . Number of Children: 0  . Years of Education: N/A   Occupational History  . retired     Programme researcher, broadcasting/film/video    Social History Main Topics  . Smoking status: Former Smoker -- 1.00 packs/day for 10 years    Types: Cigarettes    Quit date: 03/25/1968  . Smokeless tobacco: Never Used  . Alcohol Use: 4.2 oz/week    7 Glasses of wine per week     Comment: 1 glass wine daily  . Drug Use: No  . Sexual Activity: Not Currently   Other Topics Concern  . None   Social History Narrative   Regular exercise           Family History:  The patient's family history includes Lung cancer in his mother; Stroke in his father.   ROS:   Please see the history of present illness.    ROS All other systems reviewed and are negative.   PHYSICAL EXAM:   VS:  BP 94/59 mmHg  Pulse 67  Ht 5\' 9"  (1.753 m)  Wt 78.926 kg (174 lb)  BMI 25.68 kg/m2   GEN: Well nourished, well developed, in no acute distress HEENT: normal Neck: no JVD, carotid  bruits, or masses Cardiac: RRR; no murmurs, rubs, or gallops,no edema , healthy pacemaker site Respiratory:  clear to auscultation bilaterally, normal work of breathing GI: soft, nontender, nondistended, + BS MS: no deformity or atrophy Skin: warm and dry, no rash Neuro:  Alert and Oriented x 3, Strength and sensation are intact. Mild resting tremor Psych: euthymic mood, full affect  Wt Readings from Last 3 Encounters:  06/21/15 78.926 kg (174 lb)  06/02/15 81.647 kg (180 lb)  05/30/15 81.194 kg (179 lb)      Studies/Labs Reviewed:   EKG:  EKG is not ordered today  Recent Labs: No results found for requested labs within last 365 days.   Lipid Panel    Component Value Date/Time   CHOL 116 11/29/2014 1014   TRIG 83.0 11/29/2014 1014   TRIG 83 02/20/2006 0838   HDL 35.60* 11/29/2014 1014   CHOLHDL 3 11/29/2014 1014   CHOLHDL 3.4 CALC 02/20/2006 0838   VLDL 16.6 11/29/2014 1014   LDLCALC 64 11/29/2014 1014     ASSESSMENT:    1. Paroxysmal atrial fibrillation (HCC)   2. Chronotropic incompetence with sinus node dysfunction (HCC)   3. NSVT (nonsustained ventricular tachycardia) ([facility])   4. Pacemaker - Roy serial number [id number]      PLAN:  In order of problems listed above:  1. Atrial fibrillation: Is asymptomatic, spontaneously well rate controlled, infrequent. On appropriate anticoagulation secondary to increased embolic risk. CHADSVasc score 3 (age, DM) 2. SSS: After multiple adjustments and sensor settings we have achieved appropriate programming to alleviate symptoms of chronotropic incompetence. Normal device function.  3. NSVT: No new events since last device check. No treatment indicated at this time. 4. PPM: Remote download every 3 months and yearly office follow-up   Medication Adjustments/Labs and Tests Ordered: Current medicines are reviewed at length with the patient today.  Concerns regarding medicines are outlined above.   Medication changes, Labs and Tests ordered today are listed in the Patient Instructions below. Patient Instructions  Your  physician recommends that you continue on your current medications as directed. Please refer to the Current Medication list given to you today.  Remote monitoring is used to monitor your Pacemaker of ICD from home. This monitoring reduces the number of office visits required to check your device to one time per year. It allows [place] to keep an eye on the functioning of your device to ensure it is working properly. You are scheduled for a device check from home on September 22, 2015. You may send your transmission at any time that day. If you have a wireless device, the transmission will be sent automatically. After your physician reviews your transmission, you will receive a postcard with your next transmission date.  Dr Sallyanne Kuster recommends that you schedule a follow-up appointment in 12 months with a device check. You will receive a reminder letter in the mail two months in advance. If you don't receive a letter, please call our office to schedule the follow-up appointment.  If you need a refill on your cardiac medications before your next appointment, please call your pharmacy.    Mikael Spray, MD  06/22/2015 11:30 AM    [facility] [place], [place], [place]  [address] Phone: [phone]; Fax: [phone]

## 2015-06-27 LAB — CUP PACEART INCLINIC DEVICE CHECK
Battery Remaining Longevity: 174 mo
Battery Remaining Percentage: 100 %
Brady Statistic RA Percent Paced: 48 %
Brady Statistic RV Percent Paced: 4 %
Date Time Interrogation Session: 20170329200700
Implantable Lead Implant Date: 20151013
Implantable Lead Implant Date: 20151013
Implantable Lead Location: 753859
Implantable Lead Location: 753860
Implantable Lead Model: 4135
Implantable Lead Model: 4136
Implantable Lead Serial Number: 29480373
Implantable Lead Serial Number: 29608302
Lead Channel Impedance Value: 526 Ohm
Lead Channel Impedance Value: 617 Ohm
Lead Channel Setting Pacing Amplitude: 2 V
Lead Channel Setting Pacing Amplitude: 4 V
Lead Channel Setting Pacing Pulse Width: 1 ms
Lead Channel Setting Sensing Sensitivity: 2.5 mV
Pulse Gen Serial Number: 702951

## 2015-07-03 ENCOUNTER — Encounter: Payer: Self-pay | Admitting: Cardiovascular Disease

## 2015-07-25 ENCOUNTER — Encounter: Payer: Self-pay | Admitting: Cardiovascular Disease

## 2015-08-07 ENCOUNTER — Other Ambulatory Visit: Payer: Self-pay | Admitting: Neurology

## 2015-08-07 MED ORDER — CARBIDOPA-LEVODOPA 25-100 MG PO TABS: 1.5000 | ORAL_TABLET | Freq: Three times a day (TID) | ORAL | Status: DC

## 2015-08-07 NOTE — Telephone Encounter (Signed)
Carbidopa Levodopa 25/100 refill requested. Per last office note- patient to remain on medication. Refill approved and sent to patient's pharmacy.   

## 2015-08-22 ENCOUNTER — Ambulatory Visit (INDEPENDENT_AMBULATORY_CARE_PROVIDER_SITE_OTHER): Payer: Medicare Other | Admitting: Neurology

## 2015-08-22 ENCOUNTER — Encounter: Payer: Self-pay | Admitting: Neurology

## 2015-08-22 VITALS — BP 110/68 | HR 60 | Ht 69.0 in | Wt 172.0 lb

## 2015-08-22 DIAGNOSIS — G20 Parkinson's disease: Secondary | ICD-10-CM

## 2015-08-22 DIAGNOSIS — G609 Hereditary and idiopathic neuropathy, unspecified: Secondary | ICD-10-CM

## 2015-08-22 NOTE — Progress Notes (Signed)
Stephen Robbins was seen today in f/u in the movement clinic.  He was dx with PD last visit, on 06/17/13.  He is on carbidopa/levodopa 25/100 tid.  He has no side effects with the carbidopa/levodopa, but states that "I just don't know what to expect."  He continues to have tremor, which can be bothersome for him.  He has had no falls.  No lightheadedness.  No near syncope.  No hallucinations.  He had a carotid ultrasound done since last visit.  There is 1-39% stenosis bilaterally.  He did have some lab work done since last visit.  His hemoglobin A1c has greatly improved.  It is 6.3 (was 7.9 and prior to that was 9.4). He just finished PT and is enrolled in the exercise class now.  He presents today with a long list of questions.  He again tape records our session.    11/19/13 update:  Pt is f/u regarding Parkinson's disease.  He is currently on carbidopa/levodopa 25/100, one tablet 3 times per day. He is accompanied by his wife, who supplements the history.   Patient denies any falls.  He denies hallucinations.  Denies nausea or vomiting.  No lightheadedness or near syncope.  He is exercising.  He still has tremor and isn't sure that the levodopa helps tremor.  He sent me several e-mails since last visit.  One of these was just a TV news link regarding [facility] high-resolution imaging and deep brain stimulation.  He subsequently sent another e-mail regarding focused ultrasound.  Asks about updates regarding these and has 2 pages of questions.  Asks about why we can't give him human growth hormone or steroids for PD.  Overall, patient is clinically doing well.   02/21/14 update:   Pt returns for f/u, accompanied by his wife who supplements the history.  The records that were made available to me were reviewed.  Pt has had PPM since last visit due to bradycardia.  He has been told not to exercise until 12/9.  He will start with PWR moves classes after the first of the year as well as circuit 2 classes.  He is still  on carbidopa/levodopa 25/100 at 8-9am/12-1pm/6-7 pm.  He is c/o paresthesias/"neuropathy" in the R foot.  He only notices it with driving.   He has no back pain.  He has no sensation of balance loss with closing eyes in the shower.  He thinks that exposure to levodopa causes the neuropathy and brings with him a small study (58 pts) that suggests that levodopa could be an etiology for the neuropathy.  Notices lack of smell.  No falls.  Rare lightheadedness. No hallucinations.    05/23/14 update:  Pt is f/u today, accompanied by his wife who supplements the history.  He went to [facility] since our last visit and I reviewed those records.  He was told he had PN (known diagnosis) but no new meds were prescribed.  He asks about a "cure" for PN that a doctor in [place] was advertising.   He remains on carbidopa/levodopa 25/100 tid for the PD.  He is exercising faithfully.  He has trouble getting himself over 3 miles per hour when walking and he is concerned about that.  He asks again about taking steroids and human growth hormone.  He also asks about taking protein supplements.  He has d/c the glucosamine for joint pain/arthritis as he didn't think that it was helpful.    09/08/14 update:  The patient  is following up today.  He is accompanied by his wife who supplements the history.   Records were reviewed since last visit, from his primary care physician, podiatrist, as well as his cardiologist.  He remains on carbidopa/levodopa 25/100, one tablet 3 times per day.  He notices no wearing off.  He is doing PD circuit class.  He called his podiatrist since last visit and asked him about medications for neuropathy and he deferred to me.  We started gabapentin on May 30, 2014.  He is on 300 mg twice a day.  He states that his feet numbness is better.  He has continued to complain about fatigue.  His pacemaker was recently adjusted.  He is trying to walk/exercise at the park and his endurance has been better ever  since.  He again asks about energy.  He is back on testosterone for that.    01/12/15 update:  The patient returns today for follow-up, accompanied by his wife who supplements the history.  He remains on carbidopa/levodopa 25/100, one tablet 3 times per day.  Overall, the patient states that he is doing well.   Still has some intermittent tremor.  He denies any syncope.  He has had no falls since last visit.  He has had some near falls.  No hallucinations or visual distortions.  He is on gabapentin, 300 mg twice a day for peripheral neuropathy secondary to diabetes mellitus. Pt states that it is helping with the sx's, along with the "natural herb that I take."   He continues to faithfully exercise with the PWR classes.  Some lightheaded after exercise.  No new medical issues since last visit.    04/18/15 update:  The patient presents today, accompanied by his wife who supplements the history.  Last visit, increased the patient's carbidopa/levodopa 25/100, so that he is now taking 1-1/2 tablets 3 times per day.   When asked how he feel, he states, "I don't know how I am supposed to feel."  He denies stiffness.  He has minimal thumb tremor.   He is supposed to be on gabapentin, 300 mg twice a day for peripheral neuropathy but went down to q day because his feet weren't bothering him.  I have reviewed records made available to me since last visit.  He was diagnosed with atrial fibrillation and started on Xarelto.  He is doing well with that.  He denies any bleeding difficulties.  No falls.  No near syncope.  Rarely does he even feel unstable.  He is exercising.  Goes to the gym three times a week.    08/22/15 update:  The patient presents today, accompanied by his wife who supplements the history. He is on carbidopa/levodopa 25/100, 1-1/2 tablets 3 times per day.  He remains on gabapentin, 300 mg once a day for peripheral neuropathy (7-9pm). He states that he doesn't think that he can come off of it, at least until  he moves.  He did come to the PD support group lecture that I gave 2 weeks ago.  He denies hallucinations, lightheadedness, near syncope.  He does state that they are moving to [place] in assisted living.  States that there has been some stress with moving, as he is having to fix a hole that was discovered in the bathroom floor.   Asks me about alpha-lipoic acid and states that the people who sell it to him "think that it is good."    No neuroimaging since CT brain in 2009.  PREVIOUS  MEDICATIONS: none to date  ALLERGIES:   Allergies  Allergen Reactions  . Penicillins Other (See Comments)    Unknown childhood reaction    CURRENT MEDICATIONS:  Current Outpatient Prescriptions on File Prior to Visit  Medication Sig Dispense Refill  . Alpha-Lipoic Acid 600 MG CAPS Take 1 capsule by mouth daily.    Marland Kitchen ALPRAZolam (XANAX) 0.25 MG tablet Take 1 tablet (0.25 mg total) by mouth 2 (two) times daily as needed. (Patient taking differently: Take 0.25 mg by mouth daily. ) 30 tablet 2  . B Complex Vitamins (B COMPLEX 50) TABS Take 1 tablet by mouth every morning.     . carbidopa-levodopa (SINEMET IR) 25-100 MG tablet Take 1.5 tablets by mouth 3 (three) times daily before meals. 405 tablet 1  . empagliflozin (JARDIANCE) 25 MG TABS tablet Take 25 mg by mouth daily. 90 tablet 1  . fish oil-omega-3 fatty acids 1000 MG capsule Take 2 g by mouth daily.    Marland Kitchen gabapentin (NEURONTIN) 300 MG capsule Take 1 capsule (300 mg total) by mouth 2 (two) times daily. 60 capsule 5  . hydrocortisone 0.5 % cream Apply 1 application topically 2 (two) times daily as needed. rash    . ibuprofen (ADVIL,MOTRIN) 200 MG tablet Take 200 mg by mouth daily as needed for mild pain.     Marland Kitchen levothyroxine (SYNTHROID, LEVOTHROID) 50 MCG tablet take 1 tablet by mouth once daily BEFORE BREAKFAST 90 tablet 3  . mirabegron ER (MYRBETRIQ) 25 MG TB24 tablet Take 25 mg by mouth daily.    Vladimir Faster Glycol-Propyl Glycol (SYSTANE ULTRA) 0.4-0.3 %  SOLN Place 1 drop into both eyes daily as needed (dry eyes).     . rivaroxaban (XARELTO) 20 MG TABS tablet Take 1 tablet (20 mg total) by mouth daily with supper. 90 tablet 1  . sertraline (ZOLOFT) 25 MG tablet Take 25 mg by mouth every morning. Reported on 06/02/2015    . simvastatin (ZOCOR) 20 MG tablet take 1 tablet by mouth every evening 90 tablet 1  . tadalafil (CIALIS) 5 MG tablet Take 5 mg by mouth every morning.     . tamsulosin (FLOMAX) 0.4 MG CAPS capsule Take 1 capsule (0.4 mg total) by mouth every morning. 90 capsule 3  . Testosterone (AXIRON) 30 MG/ACT SOLN PLACE 2 APPLICATORFULS ONTO THE SKIN ONCE DAILY 90 mL 1  . vitamin B-12 (CYANOCOBALAMIN) 1000 MCG tablet Take 1,000 mcg by mouth every morning.      No current facility-administered medications on file prior to visit.    PAST MEDICAL HISTORY:   Past Medical History  Diagnosis Date  . Hyperlipidemia   . Hemorrhoids   . Hypothyroidism   . Asymptomatic bilateral carotid artery stenosis     per duplex  05-15-2012  left >39%/   right 40-59%  . Bradycardia   . Fatigue   . Dizzy   . BPH (benign prostatic hypertrophy) with urinary obstruction   . NSVT (nonsustained ventricular tachycardia) [facility])     cardiologist-  dr croitoru  . Chronotropic incompetence with sinus node dysfunction (HCC)   . Borderline diabetes   . GERD (gastroesophageal reflux disease)     occasional  . Compression of lumbar vertebra (HCC)     L4 -- L5  . Compressed cervical disc   . Frequency of urination   . Urgency of urination   . Nocturia   . Wears glasses   . Pacemaker   . Bronchial pneumonia 1958  . Dysmetabolic syndrome   .  Arthritis     "joints; a little" (01/04/2014)  . Basal cell carcinoma     nose    PAST SURGICAL HISTORY:   Past Surgical History  Procedure Laterality Date  . Neuroplasty / transposition ulnar nerve at elbow Left 02-09-2010  . Closed reduction nasal fracture  09-01-2007  . Exercise tolerence test  12-03-2012  DR  CROITURO    CHRONOTROPIC INCOMPETENCE/ NORMAL RESTING BP W/ APPROPRIATE RESPONSE/ NO CHEST PAIN/ NO ST CHANGES FROM BASELINE  . Laceration repair Right 1978    middle finger  . Tonsillectomy and adenoidectomy  1944  . Cystoscopy N/A 12/28/2012    Procedure: CYSTOSCOPY;  Surgeon: Bernestine Amass, MD;  Location: [facility];  Service: Urology;  Laterality: N/A;  . Transurethral incision of prostate N/A 12/28/2012    Procedure: TRANSURETHRAL INCISION OF THE PROSTATE (TUIP);  Surgeon: Bernestine Amass, MD;  Location: [facility];  Service: Urology;  Laterality: N/A;  . Ulnar nerve transposition    . Insert / replace / remove pacemaker  01/04/2014    [employer] model L121 serial number [id number]  . Basal cell carcinoma excision      nose  . Permanent pacemaker insertion N/A 01/04/2014    Procedure: PERMANENT PACEMAKER INSERTION;  Surgeon: Sanda Klein, MD;  Location: [place] [facility];  Service: Cardiovascular;  Laterality: N/A;    SOCIAL HISTORY:   Social History   Social History  . Marital Status: Married    Spouse Name: N/A  . Number of Children: 0  . Years of Education: N/A   Occupational History  . retired     Programme researcher, broadcasting/film/video    Social History Main Topics  . Smoking status: Former Smoker -- 1.00 packs/day for 10 years    Types: Cigarettes    Quit date: 03/25/1968  . Smokeless tobacco: Never Used  . Alcohol Use: 4.2 oz/week    7 Glasses of wine per week     Comment: 1 glass wine daily  . Drug Use: No  . Sexual Activity: Not Currently   Other Topics Concern  . Not on file   Social History Narrative   Regular exercise          FAMILY HISTORY:   Family Status  Relation Status Death Age  . Father Deceased     stroke  . Mother Deceased     lung cancer  . Brother Alive     healthy    ROS:    A complete 10 system review of systems was obtained and was unremarkable apart from what is mentioned above.  PHYSICAL EXAMINATION:     VITALS:   Filed Vitals:   08/22/15 1114  BP: 110/68  Pulse: 60  Height: 5\' 9"  (1.753 m)  Weight: 172 lb (78.019 kg)    GEN:  The patient appears stated age and is in NAD. HEENT:  [place]/AT.  MMM CV:  RRR (no afib today) Lungs:  CTAB Neck:  No bruits   Neurological examination:  Orientation: The patient is alert and oriented x3. Fund of knowledge is appropriate.  Recent and remote memory are intact.  Cranial nerves: There is good facial symmetry. Mild facial hypomimia.  Pupils are equal round and reactive to light bilaterally. Fundoscopic exam reveals clear margins bilaterally. Extraocular muscles are intact. The visual fields are full to confrontational testing. The speech is fluent and clear.  Soft palate rises symmetrically and there is no tongue deviation. Hearing is intact to conversational tone. Motor:  Strength is 5/5 in the bilateral upper and lower extremities.   Shoulder shrug is equal and symmetric.  There is no pronator drift. Sensation:  Intact to light touch.  More sensitive aspects not tested today  Movement examination: Tone: There is very mild rigidity in the UE (he is due for medication) Abnormal movements: There is intermittent LUE resting tremor Coordination:  There are decreased RAM's only in the feet and present with toe taps bilaterally Gait and Station: The patient has no difficulty arising out of a deep-seated chair without the use of the hands. The patient's stride length is fairly normal with just slight decreased arm swing on the L.  Negative pull test.      LABS:  Lab Results  Component Value Date   TSH 2.80 06/16/2014   Lab Results  Component Value Date   VITAMINB12 872 06/17/2013     Chemistry      Component Value Date/Time   NA 136 06/16/2014 1351   K 4.1 06/16/2014 1351   CL 102 06/16/2014 1351   CO2 30 06/16/2014 1351   BUN 13 06/16/2014 1351   CREATININE 0.73 06/16/2014 1351   CREATININE 0.77 12/29/2013 1627      Component Value  Date/Time   CALCIUM 9.4 06/16/2014 1351   ALKPHOS 41 06/16/2014 1351   AST 32 06/16/2014 1351   ALT 9 06/16/2014 1351   BILITOT 1.0 06/16/2014 1351     RPR negative Lab Results  Component Value Date   FOLATE >20.0 06/17/2013     Lab Results  Component Value Date   HGBA1C 6.4 05/30/2015    ASSESSMENT/PLAN:  1. idiopathic Parkinson's disease.  I see no atypical features.    -He will continue the carbidopa/levodopa 25/100, 1.5 tablets 3 times daily. He looked better on this   -brought a long list of questions and answered them to the best of my ability.  -moving to [place] next month 2.  Peripheral neuropathy  -There is evidence of this on examination.  Likely from DM.  His workup for reversible causes was negative.  His diabetes is under much better control than in the past.  -improved on gabapentin 300 mg and only taking qhs now. May try to wean in the future but he wanted to wait until he moved.  3.  Hx of transaminasemia  -These were elevated in 2012 but have improved 4.  R carotid bruit with hx of carotid stenosis  -04/2012 u/s with 40-50% stenosis on the R.  this was rechecked in 2015 and was 1-39% bilaterally. 5. Follow up is anticipated in the next few months, sooner should new neurologic issues arise.  Much greater than 50% of this visit was spent in counseling and coordinating care.  Total face to face time:  25 min

## 2015-08-26 ENCOUNTER — Other Ambulatory Visit: Payer: Self-pay | Admitting: Cardiovascular Disease

## 2015-08-29 ENCOUNTER — Other Ambulatory Visit: Payer: Self-pay | Admitting: *Deleted

## 2015-08-29 MED ORDER — SIMVASTATIN 20 MG PO TABS: 20.0000 mg | ORAL_TABLET | Freq: Every evening | ORAL | Status: DC

## 2015-09-01 ENCOUNTER — Other Ambulatory Visit: Payer: Self-pay | Admitting: *Deleted

## 2015-09-01 MED ORDER — TESTOSTERONE 30 MG/ACT TD SOLN: TRANSDERMAL | Status: DC

## 2015-09-01 NOTE — Telephone Encounter (Signed)
Received refill request for Axiron. Rx faxed to pharmacy.

## 2015-09-04 ENCOUNTER — Encounter: Payer: Self-pay | Admitting: Internal Medicine

## 2015-09-04 ENCOUNTER — Encounter: Payer: Self-pay | Admitting: Occupational Therapy

## 2015-09-04 ENCOUNTER — Encounter: Payer: Self-pay | Admitting: Neurology

## 2015-09-04 ENCOUNTER — Encounter: Payer: Self-pay | Admitting: Podiatry

## 2015-09-04 ENCOUNTER — Encounter: Payer: Self-pay | Admitting: Physical Therapy

## 2015-09-04 ENCOUNTER — Encounter: Payer: Self-pay | Admitting: Cardiovascular Disease

## 2015-09-04 NOTE — Telephone Encounter (Signed)
Address updated in system

## 2015-09-14 ENCOUNTER — Other Ambulatory Visit: Payer: Self-pay

## 2015-09-14 MED ORDER — EMPAGLIFLOZIN 25 MG PO TABS: 25.0000 mg | ORAL_TABLET | Freq: Every day | ORAL | Status: DC

## 2015-09-25 ENCOUNTER — Ambulatory Visit (INDEPENDENT_AMBULATORY_CARE_PROVIDER_SITE_OTHER): Payer: Medicare Other | Admitting: *Deleted

## 2015-09-25 DIAGNOSIS — I495 Sick sinus syndrome: Secondary | ICD-10-CM | POA: Diagnosis not present

## 2015-09-25 NOTE — Progress Notes (Signed)
Remote pacemaker transmission.   

## 2015-09-26 ENCOUNTER — Encounter: Payer: Self-pay | Admitting: Cardiovascular Disease

## 2015-09-27 LAB — CUP PACEART REMOTE DEVICE CHECK
Battery Remaining Longevity: 168 mo
Battery Remaining Percentage: 100 %
Brady Statistic RA Percent Paced: 45 %
Brady Statistic RV Percent Paced: 3 %
Date Time Interrogation Session: 20170703123900
Implantable Lead Implant Date: 20151013
Implantable Lead Implant Date: 20151013
Implantable Lead Location: 753859
Implantable Lead Location: 753860
Implantable Lead Model: 4135
Implantable Lead Model: 4136
Implantable Lead Serial Number: 29480373
Implantable Lead Serial Number: 29608302
Lead Channel Impedance Value: 533 Ohm
Lead Channel Impedance Value: 627 Ohm
Lead Channel Setting Pacing Amplitude: 2 V
Lead Channel Setting Pacing Amplitude: 4 V
Lead Channel Setting Pacing Pulse Width: 1 ms
Lead Channel Setting Sensing Sensitivity: 2.5 mV
Pulse Gen Serial Number: 702951

## 2015-09-29 ENCOUNTER — Encounter: Payer: Self-pay | Admitting: Cardiology

## 2015-10-13 ENCOUNTER — Encounter: Payer: Self-pay | Admitting: Cardiology

## 2015-12-05 ENCOUNTER — Other Ambulatory Visit: Payer: Self-pay | Admitting: Internal Medicine

## 2015-12-18 ENCOUNTER — Other Ambulatory Visit: Payer: Self-pay | Admitting: Internal Medicine

## 2015-12-22 NOTE — Progress Notes (Signed)
Stephen Robbins was seen today in f/u in the movement clinic.  He was dx with PD last visit, on 06/17/13.  He is on carbidopa/levodopa 25/100 tid.  He has no side effects with the carbidopa/levodopa, but states that "I just don't know what to expect."  He continues to have tremor, which can be bothersome for him.  He has had no falls.  No lightheadedness.  No near syncope.  No hallucinations.  He had a carotid ultrasound done since last visit.  There is 1-39% stenosis bilaterally.  He did have some lab work done since last visit.  His hemoglobin A1c has greatly improved.  It is 6.3 (was 7.9 and prior to that was 9.4). He just finished PT and is enrolled in the exercise class now.  He presents today with a long list of questions.  He again tape records our session.    11/19/13 update:  Pt is f/u regarding Parkinson's disease.  He is currently on carbidopa/levodopa 25/100, one tablet 3 times per day. He is accompanied by his wife, who supplements the history.   Patient denies any falls.  He denies hallucinations.  Denies nausea or vomiting.  No lightheadedness or near syncope.  He is exercising.  He still has tremor and isn't sure that the levodopa helps tremor.  He sent me several e-mails since last visit.  One of these was just a TV news link regarding [facility] high-resolution imaging and deep brain stimulation.  He subsequently sent another e-mail regarding focused ultrasound.  Asks about updates regarding these and has 2 pages of questions.  Asks about why we can't give him human growth hormone or steroids for PD.  Overall, patient is clinically doing well.   02/21/14 update:   Pt returns for f/u, accompanied by his wife who supplements the history.  The records that were made available to me were reviewed.  Pt has had PPM since last visit due to bradycardia.  He has been told not to exercise until 12/9.  He will start with PWR moves classes after the first of the year as well as circuit 2 classes.  He is still  on carbidopa/levodopa 25/100 at 8-9am/12-1pm/6-7 pm.  He is c/o paresthesias/"neuropathy" in the R foot.  He only notices it with driving.   He has no back pain.  He has no sensation of balance loss with closing eyes in the shower.  He thinks that exposure to levodopa causes the neuropathy and brings with him a small study (58 pts) that suggests that levodopa could be an etiology for the neuropathy.  Notices lack of smell.  No falls.  Rare lightheadedness. No hallucinations.    05/23/14 update:  Pt is f/u today, accompanied by his wife who supplements the history.  [facility] went to [facility] since our last visit and I reviewed those records.  He was told he had PN (known diagnosis) but no new meds were prescribed.  He asks about a "cure" for PN that a doctor in [place] was advertising.   He remains on carbidopa/levodopa 25/100 tid for the PD.  He is exercising faithfully.  He has trouble getting himself over 3 miles per hour when walking and he is concerned about that.  He asks again about taking steroids and human growth hormone.  He also asks about taking protein supplements.  He has d/c the glucosamine for joint pain/arthritis as he didn't think that it was helpful.    09/08/14 update:  The patient  is following up today.  He is accompanied by his wife who supplements the history.   Records were reviewed since last visit, from his primary care physician, podiatrist, as well as his cardiologist.  He remains on carbidopa/levodopa 25/100, one tablet 3 times per day.  He notices no wearing off.  He is doing PD circuit class.  He called his podiatrist since last visit and asked him about medications for neuropathy and he deferred to me.  We started gabapentin on May 30, 2014.  He is on 300 mg twice a day.  He states that his feet numbness is better.  He has continued to complain about fatigue.  His pacemaker was recently adjusted.  He is trying to walk/exercise at the park and his endurance has been better ever  since.  He again asks about energy.  He is back on testosterone for that.    01/12/15 update:  The patient returns today for follow-up, accompanied by his wife who supplements the history.  He remains on carbidopa/levodopa 25/100, one tablet 3 times per day.  Overall, the patient states that he is doing well.   Still has some intermittent tremor.  He denies any syncope.  He has had no falls since last visit.  He has had some near falls.  No hallucinations or visual distortions.  He is on gabapentin, 300 mg twice a day for peripheral neuropathy secondary to diabetes mellitus. Pt states that it is helping with the sx's, along with the "natural herb that I take."   He continues to faithfully exercise with the PWR classes.  Some lightheaded after exercise.  No new medical issues since last visit.    04/18/15 update:  The patient presents today, accompanied by his wife who supplements the history.  Last visit, increased the patient's carbidopa/levodopa 25/100, so that he is now taking 1-1/2 tablets 3 times per day.   When asked how he feel, he states, "I don't know how I am supposed to feel."  He denies stiffness.  He has minimal thumb tremor.   He is supposed to be on gabapentin, 300 mg twice a day for peripheral neuropathy but went down to q day because his feet weren't bothering him.  I have reviewed records made available to me since last visit.  He was diagnosed with atrial fibrillation and started on Xarelto.  He is doing well with that.  He denies any bleeding difficulties.  No falls.  No near syncope.  Rarely does he even feel unstable.  He is exercising.  Goes to the gym three times a week.    08/22/15 update:  The patient presents today, accompanied by his wife who supplements the history. He is on carbidopa/levodopa 25/100, 1-1/2 tablets 3 times per day.  He remains on gabapentin, 300 mg once a day for peripheral neuropathy (7-9pm). He states that he doesn't think that he can come off of it, at least until  he moves.  He did come to the PD support group lecture that I gave 2 weeks ago.  He denies hallucinations, lightheadedness, near syncope.  He does state that they are moving to [place] in assisted living.  States that there has been some stress with moving, as he is having to fix a hole that was discovered in the bathroom floor.   Asks me about alpha-lipoic acid and states that the people who sell it to him "think that it is good."    12/26/15 update:  The patient follows up today, accompanied  by his wife who supplements the history.  He remains on carbidopa/levodopa 25/100, 1-1/2 tablets 3 times per day.  He is still on gabapentin, 300 mg at night for neuropathy.  States that he doesn't want to try and d/c that.    He has since moved to [place] and they are adjusting well.  They have sold their house.  He is doing a [employer] class.  No falls.  Some near falls.    No neuroimaging since CT brain in 2009.  PREVIOUS MEDICATIONS: none to date  ALLERGIES:   Allergies  Allergen Reactions  . Penicillins Other (See Comments)    Unknown childhood reaction    CURRENT MEDICATIONS:  Current Outpatient Prescriptions on File Prior to Visit  Medication Sig Dispense Refill  . Alpha-Lipoic Acid 600 MG CAPS Take 1 capsule by mouth daily.    Marland Kitchen ALPRAZolam (XANAX) 0.25 MG tablet Take 1 tablet (0.25 mg total) by mouth 2 (two) times daily as needed. (Patient taking differently: Take 0.25 mg by mouth daily. ) 30 tablet 2  . B Complex Vitamins (B COMPLEX 50) TABS Take 1 tablet by mouth every morning.     . carbidopa-levodopa (SINEMET IR) 25-100 MG tablet Take 1.5 tablets by mouth 3 (three) times daily before meals. 405 tablet 1  . empagliflozin (JARDIANCE) 25 MG TABS tablet Take 25 mg by mouth daily. 90 tablet 1  . fish oil-omega-3 fatty acids 1000 MG capsule Take 2 g by mouth daily.    Marland Kitchen gabapentin (NEURONTIN) 300 MG capsule Take 1 capsule (300 mg total) by mouth 2 (two) times daily. 60 capsule 5  .  hydrocortisone 0.5 % cream Apply 1 application topically 2 (two) times daily as needed. rash    . ibuprofen (ADVIL,MOTRIN) 200 MG tablet Take 200 mg by mouth daily as needed for mild pain.     Marland Kitchen levothyroxine (SYNTHROID, LEVOTHROID) 50 MCG tablet take 1 tablet by mouth once daily BEFORE BREAKFAST 90 tablet 3  . mirabegron ER (MYRBETRIQ) 25 MG TB24 tablet Take 25 mg by mouth daily.    Vladimir Faster Glycol-Propyl Glycol (SYSTANE ULTRA) 0.4-0.3 % SOLN Place 1 drop into both eyes daily as needed (dry eyes).     . sertraline (ZOLOFT) 25 MG tablet Take 25 mg by mouth every morning. Reported on 06/02/2015    . sertraline (ZOLOFT) 25 MG tablet take 1 tablet by mouth once daily 90 tablet 1  . simvastatin (ZOCOR) 20 MG tablet take 1 tablet by mouth at bedtime 90 tablet 3  . tadalafil (CIALIS) 5 MG tablet Take 5 mg by mouth every morning.     . tamsulosin (FLOMAX) 0.4 MG CAPS capsule take 1 capsule by mouth every morning 90 capsule 1  . Testosterone (AXIRON) 30 MG/ACT SOLN PLACE 2 APPLICATORFULS ONTO THE SKIN ONCE DAILY 90 mL 2  . vitamin B-12 (CYANOCOBALAMIN) 1000 MCG tablet Take 1,000 mcg by mouth every morning.     Alveda Reasons 20 MG TABS tablet take 1 tablet by mouth once daily WITH SUPPER 90 tablet 1   No current facility-administered medications on file prior to visit.     PAST MEDICAL HISTORY:   Past Medical History:  Diagnosis Date  . Arthritis    "joints; a little" (01/04/2014)  . Asymptomatic bilateral carotid artery stenosis    per duplex  05-15-2012  left >39%/   right 40-59%  . Basal cell carcinoma    nose  . Borderline diabetes   . BPH (benign prostatic hypertrophy)  with urinary obstruction   . Bradycardia   . Bronchial pneumonia 1958  . Chronotropic incompetence with sinus node dysfunction (HCC)   . Compressed cervical disc   . Compression of lumbar vertebra (HCC)    L4 -- L5  . Dizzy   . Dysmetabolic syndrome   . Fatigue   . Frequency of urination   . GERD (gastroesophageal  reflux disease)    occasional  . Hemorrhoids   . Hyperlipidemia   . Hypothyroidism   . Nocturia   . NSVT (nonsustained ventricular tachycardia) [facility])    cardiologist-  dr croitoru  . Pacemaker   . Urgency of urination   . Wears glasses     PAST SURGICAL HISTORY:   Past Surgical History:  Procedure Laterality Date  . BASAL CELL CARCINOMA EXCISION     nose  . CLOSED REDUCTION NASAL FRACTURE  09-01-2007  . CYSTOSCOPY N/A 12/28/2012   Procedure: CYSTOSCOPY;  Surgeon: Bernestine Amass, MD;  Location: [facility];  Service: Urology;  Laterality: N/A;  . EXERCISE TOLERENCE TEST  12-03-2012  DR CROITURO   CHRONOTROPIC INCOMPETENCE/ NORMAL RESTING BP W/ APPROPRIATE RESPONSE/ NO CHEST PAIN/ NO ST CHANGES FROM BASELINE  . INSERT / REPLACE / REMOVE PACEMAKER  01/04/2014   [employer] model L121 serial number [id number]  . LACERATION REPAIR Right 1978   middle finger  . NEUROPLASTY / TRANSPOSITION ULNAR NERVE AT ELBOW Left 02-09-2010  . PERMANENT PACEMAKER INSERTION N/A 01/04/2014   Procedure: PERMANENT PACEMAKER INSERTION;  Surgeon: Sanda Klein, MD;  Location: [facility];  Service: Cardiovascular;  Laterality: N/A;  . TONSILLECTOMY AND ADENOIDECTOMY  1944  . TRANSURETHRAL INCISION OF PROSTATE N/A 12/28/2012   Procedure: TRANSURETHRAL INCISION OF THE PROSTATE (TUIP);  Surgeon: Bernestine Amass, MD;  Location: [facility];  Service: Urology;  Laterality: N/A;  . ULNAR NERVE TRANSPOSITION      SOCIAL HISTORY:   Social History   Social History  . Marital status: Married    Spouse name: N/A  . Number of children: 0  . Years of education: N/A   Occupational History  . retired Retired    Programme researcher, broadcasting/film/video    Social History Main Topics  . Smoking status: Former Smoker    Packs/day: 1.00    Years: 10.00    Types: Cigarettes    Quit date: 03/25/1968  . Smokeless tobacco: Never Used  . Alcohol use 4.2 oz/week    7 Glasses of wine per week     Comment: 1  glass wine daily  . Drug use: No  . Sexual activity: Not Currently   Other Topics Concern  . Not on file   Social History Narrative   Regular exercise          FAMILY HISTORY:   Family Status  Relation Status  . Father Deceased   stroke  . Mother Deceased   lung cancer  . Brother Alive   healthy    ROS:    A complete 10 system review of systems was obtained and was unremarkable apart from what is mentioned above.  PHYSICAL EXAMINATION:    VITALS:   There were no vitals filed for this visit.  GEN:  The patient appears stated age and is in NAD. HEENT:  [place]/AT.  MMM CV:  RRR (no afib today) Lungs:  CTAB Neck:  No bruits   Neurological examination:  Orientation: The patient is alert and oriented x3. Fund of knowledge is appropriate.  Recent  and remote memory are intact.  Cranial nerves: There is good facial symmetry. Mild facial hypomimia.  Pupils are equal round and reactive to light bilaterally. Fundoscopic exam reveals clear margins bilaterally. Extraocular muscles are intact. The visual fields are full to confrontational testing. The speech is fluent and clear.  Soft palate rises symmetrically and there is no tongue deviation. Hearing is intact to conversational tone. Motor: Strength is 5/5 in the bilateral upper and lower extremities.   Shoulder shrug is equal and symmetric.  There is no pronator drift. Sensation:  Intact to light touch.  More sensitive aspects not tested today  Movement examination: Tone: There is very mild rigidity in the UE (he is due for medication) Abnormal movements: There is intermittent LUE resting tremor Coordination:  There are decreased RAM's only in the feet and present with toe taps bilaterally Gait and Station: The patient has no difficulty arising out of a deep-seated chair without the use of the hands. The patient's stride length is fairly normal with just slight decreased arm swing on the L.  Negative pull test.      LABS:  Lab  Results  Component Value Date   TSH 2.80 06/16/2014   Lab Results  Component Value Date   VITAMINB12 872 06/17/2013     Chemistry      Component Value Date/Time   NA 136 06/16/2014 1351   K 4.1 06/16/2014 1351   CL 102 06/16/2014 1351   CO2 30 06/16/2014 1351   BUN 13 06/16/2014 1351   CREATININE 0.73 06/16/2014 1351   CREATININE 0.77 12/29/2013 1627      Component Value Date/Time   CALCIUM 9.4 06/16/2014 1351   ALKPHOS 41 06/16/2014 1351   AST 32 06/16/2014 1351   ALT 9 06/16/2014 1351   BILITOT 1.0 06/16/2014 1351     RPR negative Lab Results  Component Value Date   FOLATE >20.0 06/17/2013     Lab Results  Component Value Date   HGBA1C 6.4 05/30/2015    ASSESSMENT/PLAN:  1. idiopathic Parkinson's disease.  I see no atypical features.    -He will continue the carbidopa/levodopa 25/100, 1.5 tablets 3 times daily.    -brought a long list of questions again today and answered them to the best of my ability.  -talked to him about getting involved with activities at [facility].  Peripheral neuropathy  -There is evidence of this on examination.  Likely from DM.  His workup for reversible causes was negative.  His diabetes is under much better control than in the past.  -improved on gabapentin 300 mg and only taking qhs now. Doesn't want to wean. 3.  Hx of transaminasemia  -These were elevated in 2012 but have improved 4.  R carotid bruit with hx of carotid stenosis  -04/2012 u/s with 40-50% stenosis on the R.  this was rechecked in 2015 and was 1-39% bilaterally. 5. Follow up is anticipated in the next few months, sooner should new neurologic issues arise.  Much greater than 50% of this visit was spent in counseling and coordinating care.  Total face to face time:  25 min

## 2015-12-25 ENCOUNTER — Ambulatory Visit (INDEPENDENT_AMBULATORY_CARE_PROVIDER_SITE_OTHER): Payer: Medicare Other | Admitting: *Deleted

## 2015-12-25 ENCOUNTER — Telehealth: Payer: Self-pay | Admitting: Cardiology

## 2015-12-25 ENCOUNTER — Encounter: Payer: Self-pay | Admitting: Internal Medicine

## 2015-12-25 DIAGNOSIS — I495 Sick sinus syndrome: Secondary | ICD-10-CM

## 2015-12-25 NOTE — Telephone Encounter (Signed)
Attempted to confirm remote transmission with pt. No answer and was unable to leave a message.   

## 2015-12-26 ENCOUNTER — Ambulatory Visit (INDEPENDENT_AMBULATORY_CARE_PROVIDER_SITE_OTHER): Payer: Medicare Other | Admitting: Neurology

## 2015-12-26 ENCOUNTER — Encounter: Payer: Self-pay | Admitting: Neurology

## 2015-12-26 VITALS — BP 110/70 | HR 74 | Ht 69.0 in | Wt 174.2 lb

## 2015-12-26 DIAGNOSIS — G20 Parkinson's disease: Secondary | ICD-10-CM | POA: Diagnosis not present

## 2015-12-26 DIAGNOSIS — G20A1 Parkinson's disease without dyskinesia, without mention of fluctuations: Secondary | ICD-10-CM

## 2015-12-26 MED ORDER — GABAPENTIN 300 MG PO CAPS: 300.0000 mg | ORAL_CAPSULE | Freq: Every day | ORAL | 1 refills | Status: DC

## 2015-12-26 MED ORDER — CARBIDOPA-LEVODOPA 25-100 MG PO TABS: 1.5000 | ORAL_TABLET | Freq: Three times a day (TID) | ORAL | 1 refills | Status: DC

## 2015-12-27 ENCOUNTER — Encounter: Payer: Self-pay | Admitting: Cardiology

## 2015-12-27 NOTE — Progress Notes (Signed)
Remote pacemaker transmission.   

## 2016-01-04 LAB — CUP PACEART REMOTE DEVICE CHECK
Battery Remaining Longevity: 162 mo
Battery Remaining Percentage: 100 %
Brady Statistic RA Percent Paced: 44 %
Brady Statistic RV Percent Paced: 2 %
Date Time Interrogation Session: 20171003075700
Implantable Lead Implant Date: 20151013
Implantable Lead Implant Date: 20151013
Implantable Lead Location: 753859
Implantable Lead Location: 753860
Implantable Lead Model: 4135
Implantable Lead Model: 4136
Implantable Lead Serial Number: 29480373
Implantable Lead Serial Number: 29608302
Lead Channel Impedance Value: 540 Ohm
Lead Channel Impedance Value: 658 Ohm
Lead Channel Setting Pacing Amplitude: 2 V
Lead Channel Setting Pacing Amplitude: 4 V
Lead Channel Setting Pacing Pulse Width: 1 ms
Lead Channel Setting Sensing Sensitivity: 2.5 mV
Pulse Gen Serial Number: 702951

## 2016-01-09 DIAGNOSIS — E291 Testicular hypofunction: Secondary | ICD-10-CM | POA: Diagnosis not present

## 2016-01-10 ENCOUNTER — Encounter: Payer: Self-pay | Admitting: Cardiology

## 2016-01-15 DIAGNOSIS — N401 Enlarged prostate with lower urinary tract symptoms: Secondary | ICD-10-CM | POA: Diagnosis not present

## 2016-01-15 DIAGNOSIS — R3915 Urgency of urination: Secondary | ICD-10-CM | POA: Diagnosis not present

## 2016-01-15 DIAGNOSIS — E291 Testicular hypofunction: Secondary | ICD-10-CM | POA: Diagnosis not present

## 2016-01-15 DIAGNOSIS — R351 Nocturia: Secondary | ICD-10-CM | POA: Diagnosis not present

## 2016-01-16 ENCOUNTER — Ambulatory Visit: Payer: Medicare Other | Attending: Internal Medicine | Admitting: Physical Therapy

## 2016-01-16 ENCOUNTER — Ambulatory Visit: Payer: Medicare Other

## 2016-01-16 ENCOUNTER — Ambulatory Visit: Payer: Medicare Other | Admitting: Occupational Therapy

## 2016-01-16 DIAGNOSIS — R471 Dysarthria and anarthria: Secondary | ICD-10-CM

## 2016-01-16 DIAGNOSIS — R278 Other lack of coordination: Secondary | ICD-10-CM

## 2016-01-16 DIAGNOSIS — R2689 Other abnormalities of gait and mobility: Secondary | ICD-10-CM | POA: Insufficient documentation

## 2016-01-16 NOTE — Therapy (Signed)
[facility] [address] [place], [name], [address] Phone: [phone]   Fax:  [phone]  Patient Details  Name: Stephen Robbins MRN: [id number] Date of Birth: 28-Feb-1936 Referring Provider:  Marletta Lor, MD  Encounter Date: 01/16/2016  Speech Therapy Parkinson's Disease Screen   Decibel Level today: 70dB  (WNL=70-72 dB) with sound level meter 30cm away from pt's mouth. Pt's conversational volume has remained approx the same since last screening in March 2017.  Pt has not experienced difficulty in swallowing warranting objective evaluation.  Pt does does not require speech therapy services at this time. Recommend ST screen in another 6 months. SLP encouraged pt to read aloud for 2-3 minutes twice a day as preventative.   [facility] ,[place], [place]  01/16/2016, 1:45 PM  [facility] [address] [place], [name], [address] Phone: [phone]   Fax:  [phone]

## 2016-01-16 NOTE — Therapy (Signed)
[facility] [address] [place], [name], [address] Phone: [phone]   Fax:  [phone]  Patient Details  Name: Stephen Robbins MRN: [id number] Date of Birth: 09/02/1935 Referring Provider:  Marletta Lor, MD  Encounter Date: 01/16/2016  Occupational Therapy Parkinson's Disease Screen   Physical Performance Test item #4 (donning/doffing jacket):  11.03 sec  9-hole peg test:    RUE  27.87 sec        LUE  29.98 sec   (L-handed)  Box & Blocks Test:   RUE  44 blocks        LUE  50 blocks  Change in ability to perform ADLs/IADLs:  no   Pt would benefit from occupational therapy evaluation due to  No PD-specific HEP for coordination/ADLs, dominant LUE slower per 9-hole peg test   [facility] 01/16/2016, 1:06 PM  [facility] [address] [place], [name], [address] Phone: [phone]   Fax:  [place], OTR/L [facility] [address] [place],   [address] [phone] phone [phone] 01/16/16 1:06 PM

## 2016-01-16 NOTE — Therapy (Signed)
[facility] [address] [place], [name], [address] Phone: [phone]   Fax:  [phone]  Patient Details  Name: Stephen Robbins MRN: [id number] Date of Birth: Mar 12, 1936 Referring Provider:  Marletta Lor, MD  Encounter Date: 01/16/2016  Physical Therapy Parkinson's Disease Screen   Timed Up and Go test:12.42 sec  10 meter walk test: 9.89 ft/sec   5 time sit to stand test: 9.75 sec    Patient does not require Physical Therapy services at this time.  Recommend Physical Therapy screen in 6-9 months.  Pt has recently moved to [facility] and plans to join into exercise classes there.      Stephen Robbins W. 01/16/2016, 1:20 PM  [name]., PT  [facility] [address] [place], [name], [address] Phone: [phone]   Fax:  [phone]

## 2016-01-19 ENCOUNTER — Telehealth: Payer: Self-pay | Admitting: Neurology

## 2016-01-19 DIAGNOSIS — G20A1 Parkinson's disease without dyskinesia, without mention of fluctuations: Secondary | ICD-10-CM

## 2016-01-19 DIAGNOSIS — G20 Parkinson's disease: Secondary | ICD-10-CM

## 2016-01-19 NOTE — Telephone Encounter (Signed)
-----   Message from [name], DO sent at 01/19/2016  7:36 AM EDT ----- Regarding: FW: request for OT, ST referral   ----- Message ----- From: Delton Prairie, OT Sent: 01/18/2016   5:45 PM To: Sharen Counter, CCC-SLP, Kaylyn Lim, # Subject: request for OT, ST referral                    Dr. Carles Collet,   Mr. Enz was seen for Parkinsons OT, PT, and ST screens 01/16/16.   Pt may benefit from OT referral (to establish PD-specific HEP) and speeh therapy referral (due to decr voice volume),  If you are in agreement, please send order for Speech therapy and Occupational therapy  via epic.  Thank you,  Vianne Bulls, OTR/L [facility] [address] [place], [place]  [address] [phone] phone [phone] 01/18/16 5:49 PM

## 2016-01-19 NOTE — Telephone Encounter (Signed)
Order entered

## 2016-01-31 ENCOUNTER — Ambulatory Visit (INDEPENDENT_AMBULATORY_CARE_PROVIDER_SITE_OTHER): Payer: Medicare Other | Admitting: Internal Medicine

## 2016-01-31 ENCOUNTER — Encounter: Payer: Self-pay | Admitting: Internal Medicine

## 2016-01-31 VITALS — BP 110/72 | HR 69 | Temp 98.2°F | Resp 18 | Ht 69.0 in | Wt 173.0 lb

## 2016-01-31 DIAGNOSIS — E1142 Type 2 diabetes mellitus with diabetic polyneuropathy: Secondary | ICD-10-CM

## 2016-01-31 DIAGNOSIS — E785 Hyperlipidemia, unspecified: Secondary | ICD-10-CM | POA: Diagnosis not present

## 2016-01-31 DIAGNOSIS — E034 Atrophy of thyroid (acquired): Secondary | ICD-10-CM | POA: Diagnosis not present

## 2016-01-31 DIAGNOSIS — G20 Parkinson's disease: Secondary | ICD-10-CM

## 2016-01-31 DIAGNOSIS — I480 Paroxysmal atrial fibrillation: Secondary | ICD-10-CM

## 2016-01-31 LAB — COMPREHENSIVE METABOLIC PANEL
ALT: 8 U/L (ref 0–53)
AST: 26 U/L (ref 0–37)
Albumin: 4.5 g/dL (ref 3.5–5.2)
Alkaline Phosphatase: 41 U/L (ref 39–117)
BUN: 17 mg/dL (ref 6–23)
CO2: 27 mEq/L (ref 19–32)
Calcium: 9.7 mg/dL (ref 8.4–10.5)
Chloride: 103 mEq/L (ref 96–112)
Creatinine, Ser: 0.85 mg/dL (ref 0.40–1.50)
GFR: 92.53 mL/min (ref >60.00)
Glucose, Bld: 144 mg/dL — ABNORMAL HIGH (ref 70–99)
Potassium: 4.2 mEq/L (ref 3.5–5.1)
Sodium: 137 mEq/L (ref 135–145)
Total Bilirubin: 0.8 mg/dL (ref 0.2–1.2)
Total Protein: 7.4 g/dL (ref 6.0–8.3)

## 2016-01-31 LAB — CBC WITH DIFFERENTIAL/PLATELET
Basophils Absolute: 0 10*3/uL (ref 0.0–0.1)
Basophils Relative: 0.3 % (ref 0.0–3.0)
Eosinophils Absolute: 0.1 10*3/uL (ref 0.0–0.7)
Eosinophils Relative: 1 % (ref 0.0–5.0)
HCT: 49.4 % (ref 39.0–52.0)
Hemoglobin: 17 g/dL (ref 13.0–17.0)
Lymphocytes Relative: 20.2 % (ref 12.0–46.0)
Lymphs Abs: 1.4 10*3/uL (ref 0.7–4.0)
MCHC: 34.4 g/dL (ref 30.0–36.0)
MCV: 101.9 fl — ABNORMAL HIGH (ref 78.0–100.0)
Monocytes Absolute: 0.7 10*3/uL (ref 0.1–1.0)
Monocytes Relative: 9.7 % (ref 3.0–12.0)
Neutro Abs: 4.8 10*3/uL (ref 1.4–7.7)
Neutrophils Relative %: 68.8 % (ref 43.0–77.0)
Platelets: 165 10*3/uL (ref 150.0–400.0)
RBC: 4.85 Mil/uL (ref 4.22–5.81)
RDW: 13.5 % (ref 11.5–15.5)
WBC: 7 10*3/uL (ref 4.0–10.5)

## 2016-01-31 LAB — LIPID PANEL
Cholesterol: 132 mg/dL (ref 0–200)
HDL: 42.4 mg/dL (ref >39.00)
LDL Cholesterol: 67 mg/dL (ref 0–99)
NonHDL: 89.45
Total CHOL/HDL Ratio: 3
Triglycerides: 111 mg/dL (ref 0.0–149.0)
VLDL: 22.2 mg/dL (ref 0.0–40.0)

## 2016-01-31 LAB — MICROALBUMIN / CREATININE URINE RATIO
Creatinine,U: 105.9 mg/dL
Microalb Creat Ratio: 0.7 mg/g (ref 0.0–30.0)
Microalb, Ur: 0.7 mg/dL (ref 0.0–1.9)

## 2016-01-31 LAB — TSH: TSH: 4.49 u[IU]/mL (ref 0.35–4.50)

## 2016-01-31 LAB — HEMOGLOBIN A1C: Hgb A1c MFr Bld: 7 % — ABNORMAL HIGH (ref 4.6–6.5)

## 2016-01-31 NOTE — Progress Notes (Signed)
Subjective:    Patient ID: Stephen Robbins, male    DOB: 06/21/1935, 80 y.o.   MRN: [id number]  HPI  80 year old patient who is seen today for a preventive examination.  He has a history of PD and is followed closely by urology.  He has a history of diabetes, well controlled on present medication.  Complications include diabetic peripheral neuropathy. He has a history of paroxysmal atrial fibrillation and is followed by cardiology.  He remains on chronic anticoagulation.  He is followed by urology for BPH.  In general doing quite well today.  Past Medical History:  Diagnosis Date  . Arthritis    "joints; a little" (01/04/2014)  . Asymptomatic bilateral carotid artery stenosis    per duplex  05-15-2012  left >39%/   right 40-59%  . Basal cell carcinoma    nose  . Borderline diabetes   . BPH (benign prostatic hypertrophy) with urinary obstruction   . Bradycardia   . Bronchial pneumonia 1958  . Chronotropic incompetence with sinus node dysfunction (HCC)   . Compressed cervical disc   . Compression of lumbar vertebra (HCC)    L4 -- L5  . Dizzy   . Dysmetabolic syndrome   . Fatigue   . Frequency of urination   . GERD (gastroesophageal reflux disease)    occasional  . Hemorrhoids   . Hyperlipidemia   . Hypothyroidism   . Nocturia   . NSVT (nonsustained ventricular tachycardia) [facility])    cardiologist-  dr croitoru  . Pacemaker   . Urgency of urination   . Wears glasses      Social History   Social History  . Marital status: Married    Spouse name: N/A  . Number of children: 0  . Years of education: N/A   Occupational History  . retired Retired    Programme researcher, broadcasting/film/video    Social History Main Topics  . Smoking status: Former Smoker    Packs/day: 1.00    Years: 10.00    Types: Cigarettes    Quit date: 03/25/1968  . Smokeless tobacco: Never Used  . Alcohol use 4.2 oz/week    7 Glasses of wine per week     Comment: 1 glass wine daily  . Drug use: No  . Sexual activity: Not  Currently   Other Topics Concern  . Not on file   Social History Narrative   Regular exercise          Past Surgical History:  Procedure Laterality Date  . BASAL CELL CARCINOMA EXCISION     nose  . CLOSED REDUCTION NASAL FRACTURE  09-01-2007  . CYSTOSCOPY N/A 12/28/2012   Procedure: CYSTOSCOPY;  Surgeon: Bernestine Amass, MD;  Location: [facility];  Service: Urology;  Laterality: N/A;  . EXERCISE TOLERENCE TEST  12-03-2012  DR CROITURO   CHRONOTROPIC INCOMPETENCE/ NORMAL RESTING BP W/ APPROPRIATE RESPONSE/ NO CHEST PAIN/ NO ST CHANGES FROM BASELINE  . INSERT / REPLACE / REMOVE PACEMAKER  01/04/2014   [employer] model L121 serial number [id number]  . LACERATION REPAIR Right 1978   middle finger  . NEUROPLASTY / TRANSPOSITION ULNAR NERVE AT ELBOW Left 02-09-2010  . PERMANENT PACEMAKER INSERTION N/A 01/04/2014   Procedure: PERMANENT PACEMAKER INSERTION;  Surgeon: Sanda Klein, MD;  Location: [facility] CATH LAB;  Service: Cardiovascular;  Laterality: N/A;  . TONSILLECTOMY AND ADENOIDECTOMY  1944  . TRANSURETHRAL INCISION OF PROSTATE N/A 12/28/2012   Procedure: TRANSURETHRAL INCISION OF THE PROSTATE (TUIP);  Surgeon:  Bernestine Amass, MD;  Location[facility];  Service: Urology;  Laterality: N/A;  . ULNAR NERVE TRANSPOSITION      Family History  Problem Relation Age of Onset  . Stroke Father   . Lung cancer Mother     Allergies  Allergen Reactions  . Penicillins Other (See Comments)    Unknown childhood reaction    Current Outpatient Prescriptions on File Prior to Visit  Medication Sig Dispense Refill  . ALPRAZolam (XANAX) 0.25 MG tablet Take 1 tablet (0.25 mg total) by mouth 2 (two) times daily as needed. (Patient taking differently: Take 0.25 mg by mouth daily. ) 30 tablet 2  . B Complex Vitamins (B COMPLEX 50) TABS Take 1 tablet by mouth every morning.     . carbidopa-levodopa (SINEMET IR) 25-100 MG tablet Take 1.5 tablets by mouth 3  (three) times daily before meals. 405 tablet 1  . empagliflozin (JARDIANCE) 25 MG TABS tablet Take 25 mg by mouth daily. 90 tablet 1  . fish oil-omega-3 fatty acids 1000 MG capsule Take 2 g by mouth daily.    Marland Kitchen gabapentin (NEURONTIN) 300 MG capsule Take 1 capsule (300 mg total) by mouth at bedtime. 90 capsule 1  . hydrocortisone 0.5 % cream Apply 1 application topically 2 (two) times daily as needed. rash    . ibuprofen (ADVIL,MOTRIN) 200 MG tablet Take 200 mg by mouth daily as needed for mild pain.     Marland Kitchen levothyroxine (SYNTHROID, LEVOTHROID) 50 MCG tablet take 1 tablet by mouth once daily BEFORE BREAKFAST 90 tablet 3  . mirabegron ER (MYRBETRIQ) 25 MG TB24 tablet Take 25 mg by mouth daily.    Vladimir Faster Glycol-Propyl Glycol (SYSTANE ULTRA) 0.4-0.3 % SOLN Place 1 drop into both eyes daily as needed (dry eyes).     . sertraline (ZOLOFT) 25 MG tablet Take 25 mg by mouth every morning. Reported on 06/02/2015    . sertraline (ZOLOFT) 25 MG tablet take 1 tablet by mouth once daily 90 tablet 1  . simvastatin (ZOCOR) 20 MG tablet take 1 tablet by mouth at bedtime 90 tablet 3  . tamsulosin (FLOMAX) 0.4 MG CAPS capsule take 1 capsule by mouth every morning 90 capsule 1  . vitamin B-12 (CYANOCOBALAMIN) 1000 MCG tablet Take 1,000 mcg by mouth every morning.     Alveda Reasons 20 MG TABS tablet take 1 tablet by mouth once daily WITH SUPPER 90 tablet 1   No current facility-administered medications on file prior to visit.     BP 110/72 (BP Location: Left Arm, Patient Position: Sitting, Cuff Size: Normal)   Pulse 69   Temp 98.2 F (36.8 C) (Oral)   Resp 18   Ht 5\' 9"  (1.753 m)   Wt 173 lb (78.5 kg)   SpO2 98%   BMI 25.55 kg/m   Medicare wellness  1. Risk factors, based on past  M,S,F history.  Current vascular risk factors include dyslipidemia.  He does have a history of paroxysmal atrial fibrillation  2.  Physical activities: Remains fairly active in spite of his PD.  Exercises regularly  3.   Depression/mood: History of mild depression, well controlled with the Zoloft  4.  Hearing: No deficits  5.  ADL's: Independent  6.  Fall risk: Low to moderate due to PD which has been well-controlled.  No recent history of falls  7.  Home safety: No problems identified  8.  Height weight, and visual acuity; Height and weight stable is followed  by ophthalmology annually 9.  Counseling: Continue regular exercise and heart healthy diet  10. Lab orders based on risk factors: Laboratory profile ordered including hemoglobin A1c, lipid profile and urine for microalbumin  11. Referral : Not appropriate at this time.  Patient is being followed by OT and PT  12. Care plan: Continue effort at aggressive risk factor modification  13. Cognitive assessment: Alert and oriented with normal affect.  No cognitive dysfunction  14. Screening: Patient provided with a written and personalized 5-10 year screening schedule in the AVS.    15. Provider List Update: Primary care neurology, urology and ophthalmology.  Patient is also followed by cardiology and presently is being seen by OT and PT   Review of Systems  Constitutional: Negative for activity change, appetite change, chills, fatigue and fever.  HENT: Negative for congestion, dental problem, ear pain, hearing loss, mouth sores, rhinorrhea, sinus pressure, sneezing, tinnitus, trouble swallowing and voice change.   Eyes: Negative for photophobia, pain, redness and visual disturbance.  Respiratory: Negative for apnea, cough, choking, chest tightness, shortness of breath and wheezing.   Cardiovascular: Negative for chest pain, palpitations and leg swelling.  Gastrointestinal: Negative for abdominal distention, abdominal pain, anal bleeding, blood in stool, constipation, diarrhea, nausea, rectal pain and vomiting.  Genitourinary: Negative for decreased urine volume, difficulty urinating, discharge, dysuria, flank pain, frequency, genital sores,  hematuria, penile swelling, scrotal swelling, testicular pain and urgency.  Musculoskeletal: Negative for arthralgias, back pain, gait problem, joint swelling, myalgias, neck pain and neck stiffness.  Skin: Negative for color change, rash and wound.  Neurological: Negative for dizziness, tremors, seizures, syncope, facial asymmetry, speech difficulty, weakness, light-headedness, numbness and headaches.  Hematological: Negative for adenopathy. Does not bruise/bleed easily.  Psychiatric/Behavioral: Negative for agitation, behavioral problems, confusion, decreased concentration, dysphoric mood, hallucinations, self-injury, sleep disturbance and suicidal ideas. The patient is not nervous/anxious.        Objective:   Physical Exam  Constitutional: He appears well-developed and well-nourished.  HENT:  Head: Normocephalic and atraumatic.  Right Ear: External ear normal.  Left Ear: External ear normal.  Nose: Nose normal.  Mouth/Throat: Oropharynx is clear and moist.  Eyes: Conjunctivae and EOM are normal. Pupils are equal, round, and reactive to light. No scleral icterus.  Neck: Normal range of motion. Neck supple. No JVD present. No thyromegaly present.  Cardiovascular: Regular rhythm and normal heart sounds.  Exam reveals no gallop and no friction rub.   No murmur heard. Absent left dorsalis pedis pulse  Pulmonary/Chest: Effort normal and breath sounds normal. He exhibits no tenderness.  Abdominal: Soft. Bowel sounds are normal. He exhibits no distension and no mass. There is no tenderness.  Genitourinary: Penis normal.  Musculoskeletal: Normal range of motion. He exhibits no edema or tenderness.  Lymphadenopathy:    He has no cervical adenopathy.  Neurological: He is alert. He has normal reflexes. No cranial nerve deficit. Coordination normal.  Decreased vibratory sensation left foot  Reflexes generally blunted but symmetrical  Skin: Skin is warm and dry. No rash noted.  Psychiatric: He  has a normal mood and affect. His behavior is normal.          Assessment & Plan:   Preventive health exam Parkinson's disease.  Follow-up neurology Dyslipidemia.  Continue statin therapy BPH.  Follow-up urology Paroxysmal atrial fibrillation.  Continue chronic anticoagulation Diabetes.  Well controlled.  Will check hemoglobin A1c  Nyoka Cowden

## 2016-01-31 NOTE — Patient Instructions (Signed)
Limit your sodium (Salt) intake    It is important that you exercise regularly, at least 20 minutes 3 to 4 times per week.  If you develop chest pain or shortness of breath seek  medical attention.   Please check your hemoglobin A1c every 6 months  Follow-up multiple consultants

## 2016-01-31 NOTE — Progress Notes (Signed)
Pre visit review using our clinic review tool, if applicable. No additional management support is needed unless otherwise documented below in the visit note. 

## 2016-02-19 ENCOUNTER — Ambulatory Visit: Payer: Medicare Other | Attending: Internal Medicine | Admitting: Occupational Therapy

## 2016-02-19 ENCOUNTER — Ambulatory Visit: Payer: Medicare Other

## 2016-02-19 DIAGNOSIS — R471 Dysarthria and anarthria: Secondary | ICD-10-CM | POA: Insufficient documentation

## 2016-02-19 DIAGNOSIS — R2681 Unsteadiness on feet: Secondary | ICD-10-CM | POA: Diagnosis not present

## 2016-02-19 DIAGNOSIS — R29818 Other symptoms and signs involving the nervous system: Secondary | ICD-10-CM | POA: Insufficient documentation

## 2016-02-19 DIAGNOSIS — R293 Abnormal posture: Secondary | ICD-10-CM | POA: Diagnosis not present

## 2016-02-19 DIAGNOSIS — R278 Other lack of coordination: Secondary | ICD-10-CM | POA: Insufficient documentation

## 2016-02-19 DIAGNOSIS — R29898 Other symptoms and signs involving the musculoskeletal system: Secondary | ICD-10-CM | POA: Diagnosis not present

## 2016-02-19 NOTE — Patient Instructions (Signed)
Read aloud for 2 minutes once a day focusing on louder speech.

## 2016-02-19 NOTE — Therapy (Addendum)
[facility] [address] [place], [name], [address] Phone: [phone]   Fax:  [phone]  Speech Language Pathology Evaluation  Patient Details  Name: Stephen Robbins MRN: [id number] Date of Birth: January 26, 1936 Referring Provider: Alonza Bogus, D.O.  Encounter Date: 02/19/2016      End of Session - 02/19/16 1524    Visit Number 1   Number of Visits 13   Date for SLP Re-Evaluation 05/03/16   SLP Start Time 67   SLP Stop Time  1401   SLP Time Calculation (min) 43 min   Activity Tolerance Patient tolerated treatment well      Past Medical History:  Diagnosis Date  . Arthritis    "joints; a little" (01/04/2014)  . Asymptomatic bilateral carotid artery stenosis    per duplex  05-15-2012  left >39%/   right 40-59%  . Basal cell carcinoma    nose  . Borderline diabetes   . BPH (benign prostatic hypertrophy) with urinary obstruction   . Bradycardia   . Bronchial pneumonia 1958  . Chronotropic incompetence with sinus node dysfunction (HCC)   . Compressed cervical disc   . Compression of lumbar vertebra (HCC)    L4 -- L5  . Dizzy   . Dysmetabolic syndrome   . Fatigue   . Frequency of urination   . GERD (gastroesophageal reflux disease)    occasional  . Hemorrhoids   . Hyperlipidemia   . Hypothyroidism   . Nocturia   . NSVT (nonsustained ventricular tachycardia) [facility])    cardiologist-  dr croitoru  . Pacemaker   . Urgency of urination   . Wears glasses     Past Surgical History:  Procedure Laterality Date  . BASAL CELL CARCINOMA EXCISION     nose  . CLOSED REDUCTION NASAL FRACTURE  09-01-2007  . CYSTOSCOPY N/A 12/28/2012   Procedure: CYSTOSCOPY;  Surgeon: Bernestine Amass, MD;  Location: [facility];  Service: Urology;  Laterality: N/A;  . EXERCISE TOLERENCE TEST  12-03-2012  DR CROITURO   CHRONOTROPIC INCOMPETENCE/ NORMAL RESTING BP W/ APPROPRIATE RESPONSE/ NO CHEST PAIN/ NO ST CHANGES  FROM BASELINE  . INSERT / REPLACE / REMOVE PACEMAKER  01/04/2014   [employer] model L121 serial number [id number]  . LACERATION REPAIR Right 1978   middle finger  . NEUROPLASTY / TRANSPOSITION ULNAR NERVE AT ELBOW Left 02-09-2010  . PERMANENT PACEMAKER INSERTION N/A 01/04/2014   Procedure: PERMANENT PACEMAKER INSERTION;  Surgeon: Sanda Klein, MD;  Location: [place] CATH LAB;  Service: Cardiovascular;  Laterality: N/A;  . TONSILLECTOMY AND ADENOIDECTOMY  1944  . TRANSURETHRAL INCISION OF PROSTATE N/A 12/28/2012   Procedure: TRANSURETHRAL INCISION OF THE PROSTATE (TUIP);  Surgeon: Bernestine Amass, MD;  Location: [facility];  Service: Urology;  Laterality: N/A;  . ULNAR NERVE TRANSPOSITION      There were no vitals filed for this visit.      Subjective Assessment - 02/19/16 1325    Subjective Pt states his voice is "scratchy" today, and occurs periodically.    Patient is accompained by: --  wife   Currently in Pain? No/denies            SLP Evaluation [facility] - 02/19/16 1325      SLP Visit Information   SLP Received On 02/19/16   Referring Provider [name], Wells Guiles, D.O.   Onset Date diagnosed March 2015   Medical Diagnosis Parkinson's Disease     General Information  HPI  Pt declared goal/s Pt with symptoms of disease prior to diagnosis. Notices a decr in strength of his voice in the last three years "Obtain a stronger voice."     Prior Functional Status   Cognitive/Linguistic Baseline Within functional limits     Cognition   Overall Cognitive Status --  not formally assessed today     Auditory Comprehension   Overall Auditory Comprehension Appears within functional limits for tasks assessed     Verbal Expression   Overall Verbal Expression Appears within functional limits for tasks assessed     Written Expression   Written Expression Within Functional Limits  pt took notes during evaluation     Oral Motor/Sensory Function   Overall Oral  Motor/Sensory Function Impaired   Labial ROM Within Functional Limits   Labial Strength Within Functional Limits   Labial Coordination Reduced   Lingual ROM Within Functional Limits   Lingual Symmetry Within Functional Limits   Lingual Strength Reduced Left   Lingual Coordination Reduced   Velum Within Functional Limits     Motor Speech   Overall Motor Speech Impaired   Phonation Hoarse;Low vocal intensity  lower intensity after 5 minutes speech   Intelligibility Intelligible   Effective Techniques Increased vocal intensity   Phonation Impaired   Volume Soft  69dB avg-9 min conversation      Nine minutes of conversational speech was reduced today, at average 69dB (WNL= average 70-72dB) with range of 64-72dB, when a sound level meter was placed 30 cm away from pt's mouth. Overall intelligibility for this listener in a quiet environment was approx 100%. Production of loud /a/ averaged 92dB (range of 82 to 95) and usual cues needed for loudness and appropriate voicing (diaphragmatic push rather than laryngeal push). Pt rated effort level at 8/10 for production of loud /a/ (10=maximal effort).   In conversation task of 3 mintues, pt was asked to use the same amount of effort as with loud /a/. Loudness average with this increased effort was 71dB (range of 67 to 76) with rare min A for loudness. Pt noted to self correct decr'd loudness after 60 seconds of conversation. Pt would benefit from skilled ST in order to improve pt's QOL and to stave off further decline of speech function.                    SLP Education - 02/19/16 1524    Education provided Yes   Education Details ST POC, voice in parkinson's disease, speech information on [email or web address]   Person(s) Educated Patient;Spouse   Methods Explanation   Comprehension Verbalized understanding          SLP Short Term Goals - 02/19/16 1631      SLP SHORT TERM GOAL #1   Title pt will produce loud /a/ at average 93dB  over three consecutive therapy sessions   Time 4   Period Weeks   Status New     SLP SHORT TERM GOAL #2   Title pt will average 71dB in 18/20 sentence responses over three consecutive sessions   Time 4   Period Weeks   Status New     SLP SHORT TERM GOAL #3   Title pt will demo 10 minutes simple conversation with average 70dB over three sessions   Time 4   Period Weeks   Status New          SLP Long Term Goals - 02/19/16 1633      SLP LONG TERM  GOAL #1   Title pt will maintain average 93dB loud /a/ with abdominal breathing and proper voicing over 5 sessions   Time 6   Period Weeks  or 12 therapy sessions before 05-03-26   Status New     SLP LONG TERM GOAL #2   Title pt will demo conversational volume of average 71dB over 15 minutes mod-complex/complex conversation over three sessions   Time 6   Period Weeks  or 12 therapy visits before 05-03-16   Status New          Plan - March 18, 2016 1525    Clinical Impression Statement Pt presents today with slightly decr'd conversational volume, more evident as conversation progressed past 9 minutes. Pt was able to incr volume to average 71dB in a 3 minute conversation and self-corrected x1 when his volume decr'd to <70dB after 60 seconds. Pt would benefit from skilled ST to incr loudness in conversations in varying contexts over 10 minutes in length.   Speech Therapy Frequency 2x / week   Duration --  6 weeks, or 13 total visits in 90 days   Treatment/Interventions Compensatory strategies;Patient/family education;Functional tasks;Cueing hierarchy;Internal/external aids;SLP instruction and feedback   Potential to Achieve Goals Good   Consulted and Agree with Plan of Care Patient      Patient will benefit from skilled therapeutic intervention in order to improve the following deficits and impairments:   Dysarthria and anarthria      G-Codes - 03-18-2016 1635    Functional Assessment Tool Used NOMS - 5    Functional Limitations  Motor speech   Motor Speech Current Status [phone]) At least 20 percent but less than 40 percent impaired, limited or restricted   Motor Speech Goal Status [id number]) At least 1 percent but less than 20 percent impaired, limited or restricted      Problem List Patient Active Problem List   Diagnosis Date Noted  . Paroxysmal atrial fibrillation ([place]) 02/23/2015  . Chronotropic incompetence with sinus node dysfunction (HCC)   . Pacemaker - [place] serial number [id number] 01/04/2014  . SSS (sick sinus syndrome) ([place]) 12/20/2013  . Chronotropic incompetence with autonomic dysfunction 12/20/2013  . Diabetic peripheral neuropathy ([place]) 06/17/2013  . Transaminasemia 06/17/2013  . Paralysis agitans ([place]) 06/17/2013  . Parkinson's disease ([place]) 06/17/2013  . NSVT (nonsustained ventricular tachycardia) ([place]) 12/18/2012  . Bradycardia 11/26/2012  . Dizziness, nonspecific 11/26/2012  . Cough 01/04/2012  . CARPAL TUNNEL SYNDROME, BILATERAL 01/23/2010  . HEARING LOSS 12/18/2009  . HYPOGLYCEMIA, UNSPECIFIED 10/10/2008  . Seasonal and perennial allergic rhinitis 03/26/2008  . UNS ADVRS EFF UNS RX MEDICINAL&BIOLOGICAL SBSTNC 09/17/2007  . HYPOGONADISM, MALE 04/23/2007  . DEPRESSION, MILD 02/10/2007  . BENIGN PROSTATIC HYPERTROPHY, WITH OBSTRUCTION 02/10/2007  . OSTEOARTHRITIS 02/10/2007  . LOW BACK PAIN 02/10/2007  . HYPERGLYCEMIA, BORDERLINE 02/10/2007  . Hypothyroidism 10/14/2006  . INTERNAL HEMORRHOIDS 10/14/2006  . Dyslipidemia 09/18/2006    [facility] ,[place], CCC-SLP  18-Mar-2016, 4:36 PM  [facility] [address] [place], [name], [address] Phone: [phone]   Fax:  [phone]  Name: SILVER WISNER MRN: [id number] Date of Birth: [birth year]

## 2016-02-19 NOTE — Addendum Note (Signed)
Addended by: Garald Balding B on: 02/19/2016 04:40 PM   Modules accepted: Orders

## 2016-02-19 NOTE — Therapy (Signed)
[facility] [address] [place], [name], [address] Phone: [phone]   Fax:  [phone]  Occupational Therapy Evaluation  Patient Details  Name: Stephen Robbins MRN: [id number] Date of Birth: [birth year] Referring Provider: Dr. Wells Guiles [name]   Encounter Date: 02/19/2016      OT End of Session - 02/19/16 1916    Visit Number 1   Number of Visits 13   Date for OT Re-Evaluation 04/18/16   Authorization Type Medicare / BCBS (G-code needed)   Authorization Time Period Cert. date through 04/18/16 (may need to modify due to schedule conflicts)   Authorization - Visit Number 1   Authorization - Number of Visits 10   OT Start Time 1405   OT Stop Time 1448   OT Time Calculation (min) 43 min   Activity Tolerance Patient tolerated treatment well   Behavior During Therapy [facility] for tasks assessed/performed;Flat affect      Past Medical History:  Diagnosis Date  . Arthritis    "joints; a little" (01/04/2014)  . Asymptomatic bilateral carotid artery stenosis    per duplex  05-15-2012  left >39%/   right 40-59%  . Basal cell carcinoma    nose  . Borderline diabetes   . BPH (benign prostatic hypertrophy) with urinary obstruction   . Bradycardia   . Bronchial pneumonia 1958  . Chronotropic incompetence with sinus node dysfunction (HCC)   . Compressed cervical disc   . Compression of lumbar vertebra (HCC)    L4 -- L5  . Dizzy   . Dysmetabolic syndrome   . Fatigue   . Frequency of urination   . GERD (gastroesophageal reflux disease)    occasional  . Hemorrhoids   . Hyperlipidemia   . Hypothyroidism   . Nocturia   . NSVT (nonsustained ventricular tachycardia) [facility])    cardiologist-  dr croitoru  . Pacemaker   . Urgency of urination   . Wears glasses     Past Surgical History:  Procedure Laterality Date  . BASAL CELL CARCINOMA EXCISION     nose  . CLOSED REDUCTION NASAL FRACTURE  09-01-2007  . CYSTOSCOPY N/A  12/28/2012   Procedure: CYSTOSCOPY;  Surgeon: Bernestine Amass, MD;  [facility];  Service: Urology;  Laterality: N/A;  . EXERCISE TOLERENCE TEST  12-03-2012  DR CROITURO   CHRONOTROPIC INCOMPETENCE/ NORMAL RESTING BP W/ APPROPRIATE RESPONSE/ NO CHEST PAIN/ NO ST CHANGES FROM BASELINE  . INSERT / REPLACE / REMOVE PACEMAKER  01/04/2014   [employer] model L121 serial number [id number]  . LACERATION REPAIR Right 1978   middle finger  . NEUROPLASTY / TRANSPOSITION ULNAR NERVE AT ELBOW Left 02-09-2010  . PERMANENT PACEMAKER INSERTION N/A 01/04/2014   Procedure: PERMANENT PACEMAKER INSERTION;  Surgeon: Sanda Klein, MD;  Location: [place] CATH LAB;  Service: Cardiovascular;  Laterality: N/A;  . TONSILLECTOMY AND ADENOIDECTOMY  1944  . TRANSURETHRAL INCISION OF PROSTATE N/A 12/28/2012   Procedure: TRANSURETHRAL INCISION OF THE PROSTATE (TUIP);  Surgeon: Bernestine Amass, MD;  Location: [facility];  Service: Urology;  Laterality: N/A;  . ULNAR NERVE TRANSPOSITION      There were no vitals filed for this visit.      Subjective Assessment - 02/19/16 1408    Subjective  "I'm doing ok"   Patient is accompained by: Family member  wife   Pertinent History pacemaker, L ulnar nerve transposition with L 5th decr ext, ?CTR, osteoporosis, hx of low back  pain, mild depression, DM, hypothyroidism, hypogonadism.    Limitations PACEMAKER   Patient Stated Goals slow progression   Currently in Pain? No/denies           [facility] OT Assessment - 02/19/16 0001      Assessment   Diagnosis Parkinsons disease   Referring Provider Dr. Wells Guiles [name]    Onset Date --  05/2013   Prior Therapy Physical therapy in the past     Precautions   Precautions ICD/Pacemaker     Balance Screen   Has the patient fallen in the past 6 months No     Home  Environment   Family/patient expects to be discharged to: Other (comment)  Independent Living at [facility]     Prior Function   Level of [place]! exercise class     ADL   Eating/Feeding Modified independent   Grooming Modified independent   Upper Body Bathing Modified independent   Lower Body Bathing Modified independent   Upper Body Dressing Independent   Lower Body Dressing Modified independent   Toilet Tranfer Modified independent   Toileting - Clothing Manipulation Modified independent   Toileting -  Hygiene Modified Independent   Tub/Shower Transfer Modified independent   Tub/Shower Transfer Equipment Walk in shower;Grab bars  built-in seat (doesn't use)   Transfers/Ambulation Related to ADL's pt denies difficulty     IADL   Prior Level of Function Light Housekeeping performing light tasks independently  cleaning service 1x week   Prior Level of Function Meal Prep eats 1 meal in dining room  light snack prep/warming up items for other meals   Community Mobility Drives own vehicle   Medication Management Is responsible for taking medication in correct dosages at correct time     Mobility   Mobility Status Independent   Mobility Status Comments per pt "some near falls"     Written Expression   Dominant Hand Left   Handwriting --  95-100% legibile, good size, pt denies change      Vision - History   Baseline Vision Wears glasses all the time     Vision Assessment   Eye Alignment Within Functional Limits  per pt report     Activity Tolerance   Activity Tolerance Endurance does not limit participation in activity  per pt report     Cognition   Overall Cognitive Status Within Functional Limits for tasks assessed     Observation/Other Assessments   Observations posture with rounded shoulders, forward head   Standing Functional Reach Test R-10", L-10"   Other Surveys  Select   Physical Performance Test   Yes   Simulated Eating Time (seconds) 11.59   Simulated Eating Comments holds spoon towards end   Donning Doffing Jacket Time  (seconds) 15.66sec   Donning Doffing Jacket Comments Fastening/unfastening 3 buttons on table in 27.06     Sensation   Additional Comments pt reports neuropathy in feet     Coordination   9 Hole Peg Test Right;Left   Right 9 Hole Peg Test 28.59   Left 9 Hole Peg Test 32.65   Box and Blocks R-39blocks, L-42blocks     Tone   Assessment Location Right Upper Extremity;Left Upper Extremity     ROM / Strength   AROM / PROM / Strength AROM     AROM   Overall AROM  Within functional limits for tasks performed  decr ext of L 5th digit w/ hx  of ulnar transposition     RUE Tone   RUE Tone Within Functional Limits     LUE Tone   LUE Tone Mild  very mild                          OT Education - 02/19/16 1908    Education provided Yes   Education Details OT POC and eval results   Person(s) Educated Patient;Spouse   Methods Explanation   Comprehension Verbalized understanding          OT Short Term Goals - 02/19/16 1930      OT SHORT TERM GOAL #1   Title Pt will be independent with PD-specific HEP.--check STGs 04/18/16   Time 4   Period Weeks   Status New     OT SHORT TERM GOAL #2   Title Pt will verbalize understanding of ways to prevent future complications related to PD.   Time 4   Period Weeks   Status New     OT SHORT TERM GOAL #3   Title Pt will improve PPT #4 (donning/doffing jacket) by at least 3sec with use of large amplitude movement strategies to prevent future complications.   Time 4   Period Weeks   Status New           OT Long Term Goals - 02/19/16 1933      OT LONG TERM GOAL #1   Title Pt will verbalize understanding of strategies to incr ease/safety of ADLs/IADLs and to prevent future complications related to PD.--check LTGs 05/02/16 (adjusted date due to schedule conflicts)   Time 6   Period Weeks   Status New     OT LONG TERM GOAL #2   Title Pt will improve coordination for ADLs as shown by completing 9-hole peg test in 29sec  or less with dominant LUE.   Baseline 32.65sec   Time 6   Period Weeks   Status New     OT LONG TERM GOAL #3   Title Pt will improve bilateral functional reaching for ADLs as shown by improving score on box and blocks test  by at least 5 bilaterally.   Baseline R-39 blocks, L-42 blocks   Time 6   Period Weeks   Status New     OT LONG TERM GOAL #4   Title Pt will improve efficiency with dressing as shown by buttoning/unbuttoning 3 buttons in 24sec or less.   Baseline 27.06sec   Time 6   Period Weeks   Status New               Plan - 02/19/16 1920    Clinical Impression Statement Pt is a 80 y.o. male diagnosed with Parkinsons disease in 2015.  Pt has not previously participated in occupational therapy, but PD screen 01/16/16 indicated that pt may benefit from occupational therapy for mild decr coordination and to establish PD-specific HEP for coordination.  Pt with PMH that includes:  pacemaker, L ulnar nerve transposition with L 5th decr ext, ?CTR, osteoporosis, hx of low back pain, mild depression, DM, hypothyroidism, hypogonadism.  Pt presents today with bradykinesia, mild rigidity, decr coordination, abnormal posture, decr balance for ADLs.  Pt would benefit from occupational therapy to address these deficits, establish PD-specific HEP, and educate in strategies for ADLs to prevent future complications.   Rehab Potential Good   OT Frequency 2x / week   OT Duration 6 weeks  +eval   OT Treatment/Interventions  Self-care/ADL training;Therapeutic exercise;Parrafin;DME and/or AE instruction;Therapist, nutritional;Therapeutic activities;Patient/family education;Cognitive remediation/compensation;Manual Therapy;Splinting;Neuromuscular education;Fluidtherapy;Cryotherapy;Moist Heat;Energy conservation;Passive range of motion;Therapeutic exercises   Plan PWR! hands, begin functional activities with large amplitude movement strategies to prevent future complications (grasp/release of  cup, donning/doffing jacket, etc.)   Recommended Other Services currently receiving ST, has received PT in the past (01/16/16 screen did not indicate need for PT at this time), participates in PWR! class   Consulted and Agree with Plan of Care Patient;Family member/caregiver   Family Member Consulted wife      Patient will benefit from skilled therapeutic intervention in order to improve the following deficits and impairments:  Decreased coordination, Decreased knowledge of use of DME, Impaired UE functional use, Improper spinal/pelvic alignment, Impaired tone, Impaired perceived functional ability, Decreased balance, Impaired sensation (bradykinesia)  Visit Diagnosis: Other symptoms and signs involving the nervous system  Other symptoms and signs involving the musculoskeletal system  Other lack of coordination  Unsteadiness on feet  Posture abnormality      G-Codes - Feb 23, 2016 1938    Functional Assessment Tool Used 9-hole peg test:  L-32.65sec;  Box and blocks test:  R-39, L-42 blocks; PPT #4 in 15.66sec, fastening/unfastening 3 buttons in 27.06sec   Functional Limitation Carrying, moving and handling objects   Carrying, Moving and Handling Objects Current Status [phone]) At least 20 percent but less than 40 percent impaired, limited or restricted   Carrying, Moving and Handling Objects Goal Status [id number]) At least 1 percent but less than 20 percent impaired, limited or restricted      Problem List Patient Active Problem List   Diagnosis Date Noted  . Paroxysmal atrial fibrillation ([place]) 02/23/2015  . Chronotropic incompetence with sinus node dysfunction (HCC)   . Pacemaker - [place] serial number [id number] 01/04/2014  . SSS (sick sinus syndrome) ([employer]) 12/20/2013  . Chronotropic incompetence with autonomic dysfunction 12/20/2013  . Diabetic peripheral neuropathy ([place]) 06/17/2013  . Transaminasemia 06/17/2013  . Paralysis agitans ([place]) 06/17/2013  .  Parkinson's disease ([place]) 06/17/2013  . NSVT (nonsustained ventricular tachycardia) ([place]) 12/18/2012  . Bradycardia 11/26/2012  . Dizziness, nonspecific 11/26/2012  . Cough 01/04/2012  . CARPAL TUNNEL SYNDROME, BILATERAL 01/23/2010  . HEARING LOSS 12/18/2009  . HYPOGLYCEMIA, UNSPECIFIED 10/10/2008  . Seasonal and perennial allergic rhinitis 03/26/2008  . UNS ADVRS EFF UNS RX MEDICINAL&BIOLOGICAL SBSTNC 09/17/2007  . HYPOGONADISM, MALE 04/23/2007  . DEPRESSION, MILD 02/10/2007  . BENIGN PROSTATIC HYPERTROPHY, WITH OBSTRUCTION 02/10/2007  . OSTEOARTHRITIS 02/10/2007  . LOW BACK PAIN 02/10/2007  . HYPERGLYCEMIA, BORDERLINE 02/10/2007  . Hypothyroidism 10/14/2006  . INTERNAL HEMORRHOIDS 10/14/2006  . Dyslipidemia 09/18/2006    [facility] 2016-02-23, 7:40 PM  [facility] [address] [place], [name], [address] Phone: [phone]   Fax:  [phone]  Name: Stephen Robbins MRN: [id number] Date of Birth: Jan 04, 1936   Vianne Bulls, OTR/L [facility] [address] [place], [place]  [address] [phone] phone [phone] 02-23-2016 7:40 PM

## 2016-03-11 ENCOUNTER — Other Ambulatory Visit: Payer: Self-pay | Admitting: Internal Medicine

## 2016-03-11 ENCOUNTER — Telehealth: Payer: Self-pay

## 2016-03-11 MED ORDER — CANAGLIFLOZIN 300 MG PO TABS: 300.0000 mg | ORAL_TABLET | Freq: Every day | ORAL | 6 refills | Status: DC

## 2016-03-11 NOTE — Telephone Encounter (Signed)
Received PA request for Invokana 300 mg tablets from [facility] on [address]. PA submitted & is pending. Key: [id number]

## 2016-03-12 NOTE — Telephone Encounter (Signed)
Please see message and advise 

## 2016-03-12 NOTE — Telephone Encounter (Signed)
PA denied. Patient has tried [place], but patient hasn't tried [place].

## 2016-03-12 NOTE — Telephone Encounter (Signed)
Stephen Robbins is adequate substitute 10 mg #90 one daily

## 2016-03-13 MED ORDER — DAPAGLIFLOZIN PROPANEDIOL 10 MG PO TABS: 10.0000 mg | ORAL_TABLET | Freq: Every day | ORAL | 1 refills | Status: DC

## 2016-03-13 NOTE — Telephone Encounter (Signed)
Spoke with pt informed of medication change to Farxiga 10 mg #90 one daily. Rx faxed to pharmacy. Pt verbalized understanding.

## 2016-03-14 ENCOUNTER — Other Ambulatory Visit: Payer: Self-pay | Admitting: Internal Medicine

## 2016-03-14 ENCOUNTER — Other Ambulatory Visit: Payer: Self-pay | Admitting: Cardiovascular Disease

## 2016-03-26 ENCOUNTER — Ambulatory Visit: Payer: Medicare Other | Admitting: Occupational Therapy

## 2016-03-26 ENCOUNTER — Ambulatory Visit: Payer: Medicare Other | Attending: Internal Medicine

## 2016-03-26 DIAGNOSIS — R29818 Other symptoms and signs involving the nervous system: Secondary | ICD-10-CM

## 2016-03-26 DIAGNOSIS — R278 Other lack of coordination: Secondary | ICD-10-CM | POA: Insufficient documentation

## 2016-03-26 DIAGNOSIS — R293 Abnormal posture: Secondary | ICD-10-CM | POA: Diagnosis not present

## 2016-03-26 DIAGNOSIS — R2689 Other abnormalities of gait and mobility: Secondary | ICD-10-CM | POA: Insufficient documentation

## 2016-03-26 DIAGNOSIS — R471 Dysarthria and anarthria: Secondary | ICD-10-CM | POA: Insufficient documentation

## 2016-03-26 DIAGNOSIS — R2681 Unsteadiness on feet: Secondary | ICD-10-CM | POA: Insufficient documentation

## 2016-03-26 DIAGNOSIS — R490 Dysphonia: Secondary | ICD-10-CM | POA: Insufficient documentation

## 2016-03-26 DIAGNOSIS — R29898 Other symptoms and signs involving the musculoskeletal system: Secondary | ICD-10-CM | POA: Diagnosis not present

## 2016-03-26 NOTE — Therapy (Signed)
[facility] [address] [place], [name], [address] Phone: [phone]   Fax:  [phone]  Occupational Therapy Treatment  Patient Details  Name: Stephen Robbins MRN: [id number] Date of Birth: [birth year] Referring Provider: Dr. Wells Guiles [name]   Encounter Date: 03/26/2016      OT End of Session - 03/26/16 1027    Visit Number 2   Number of Visits 13   Date for OT Re-Evaluation 04/18/16   Authorization Type Medicare / BCBS (G-code needed)   Authorization Time Period Cert. date through 04/18/16 (may need to modify due to schedule conflicts)   Authorization - Visit Number 2   Authorization - Number of Visits 10   OT Start Time 1025  pt in BR   OT Stop Time 1100   OT Time Calculation (min) 35 min   Activity Tolerance Patient tolerated treatment well   Behavior During Therapy WFL for tasks assessed/performed      Past Medical History:  Diagnosis Date  . Arthritis    "joints; a little" (01/04/2014)  . Asymptomatic bilateral carotid artery stenosis    per duplex  05-15-2012  left >39%/   right 40-59%  . Basal cell carcinoma    nose  . Borderline diabetes   . BPH (benign prostatic hypertrophy) with urinary obstruction   . Bradycardia   . Bronchial pneumonia 1958  . Chronotropic incompetence with sinus node dysfunction (HCC)   . Compressed cervical disc   . Compression of lumbar vertebra (HCC)    L4 -- L5  . Dizzy   . Dysmetabolic syndrome   . Fatigue   . Frequency of urination   . GERD (gastroesophageal reflux disease)    occasional  . Hemorrhoids   . Hyperlipidemia   . Hypothyroidism   . Nocturia   . NSVT (nonsustained ventricular tachycardia) [facility])    cardiologist-  dr croitoru  . Pacemaker   . Urgency of urination   . Wears glasses     Past Surgical History:  Procedure Laterality Date  . BASAL CELL CARCINOMA EXCISION     nose  . CLOSED REDUCTION NASAL FRACTURE  09-01-2007  . CYSTOSCOPY N/A 12/28/2012    Procedure: CYSTOSCOPY;  Surgeon: Bernestine Amass, MD;  Location: [facility];  Service: Urology;  Laterality: N/A;  . EXERCISE TOLERENCE TEST  12-03-2012  DR CROITURO   CHRONOTROPIC INCOMPETENCE/ NORMAL RESTING BP W/ APPROPRIATE RESPONSE/ NO CHEST PAIN/ NO ST CHANGES FROM BASELINE  . INSERT / REPLACE / REMOVE PACEMAKER  01/04/2014   [employer] model L121 serial number [id number]  . LACERATION REPAIR Right 1978   middle finger  . NEUROPLASTY / TRANSPOSITION ULNAR NERVE AT ELBOW Left 02-09-2010  . PERMANENT PACEMAKER INSERTION N/A 01/04/2014   Procedure: PERMANENT PACEMAKER INSERTION;  Surgeon: Sanda Klein, MD;  Location: [facility] CATH LAB;  Service: Cardiovascular;  Laterality: N/A;  . TONSILLECTOMY AND ADENOIDECTOMY  1944  . TRANSURETHRAL INCISION OF PROSTATE N/A 12/28/2012   Procedure: TRANSURETHRAL INCISION OF THE PROSTATE (TUIP);  Surgeon: Bernestine Amass, MD;  [facility];  Service: Urology;  Laterality: N/A;  . ULNAR NERVE TRANSPOSITION      There were no vitals filed for this visit.             Treatment: Pt practiced donning doffing jacket, grasp / release of items and opening a screwtop container with adapted techniques, larger amplitude movements.           PWR (  [facility]) - 03/26/16 1216    PWR! exercises Hands   PWR! Up 10   PWR! Rock 10   PWR! Twist 10   PWR! Step 10   Comments min v.c. and demonstration             OT Education - 03/26/16 1210    Education provided Yes   Education Details  coordination HEP, big movements with ADLS   Person(s) Educated Patient;Spouse   Methods Explanation;Demonstration;Verbal cues;Handout   Comprehension Verbalized understanding;Returned demonstration;Verbal cues required          OT Short Term Goals - 02/19/16 1930      OT SHORT TERM GOAL #1   Title Pt will be independent with PD-specific HEP.--check STGs 04/18/16   Time 4   Period Weeks   Status New     OT  SHORT TERM GOAL #2   Title Pt will verbalize understanding of ways to prevent future complications related to PD.   Time 4   Period Weeks   Status New     OT SHORT TERM GOAL #3   Title Pt will improve PPT #4 (donning/doffing jacket) by at least 3sec with use of large amplitude movement strategies to prevent future complications.   Time 4   Period Weeks   Status New           OT Long Term Goals - 02/19/16 1933      OT LONG TERM GOAL #1   Title Pt will verbalize understanding of strategies to incr ease/safety of ADLs/IADLs and to prevent future complications related to PD.--check LTGs 05/02/16 (adjusted date due to schedule conflicts)   Time 6   Period Weeks   Status New     OT LONG TERM GOAL #2   Title Pt will improve coordination for ADLs as shown by completing 9-hole peg test in 29sec or less with dominant LUE.   Baseline 32.65sec   Time 6   Period Weeks   Status New     OT LONG TERM GOAL #3   Title Pt will improve bilateral functional reaching for ADLs as shown by improving score on box and blocks test  by at least 5 bilaterally.   Baseline R-39 blocks, L-42 blocks   Time 6   Period Weeks   Status New     OT LONG TERM GOAL #4   Title Pt will improve efficiency with dressing as shown by buttoning/unbuttoning 3 buttons in 24sec or less.   Baseline 27.06sec   Time 6   Period Weeks   Status New               Plan - 03/26/16 1210    Clinical Impression Statement Pt is progressing towards goals. He demonstrates understanding of coordination HEP, yet he may benefit from reinforcement of larger amplitude movements.    Rehab Potential Good   OT Frequency 2x / week   OT Duration 6 weeks   OT Treatment/Interventions Self-care/ADL training;Therapeutic exercise;Parrafin;DME and/or AE instruction;Therapist, nutritional;Therapeutic activities;Patient/family education;Cognitive remediation/compensation;Manual Therapy;Splinting;Neuromuscular  education;Fluidtherapy;Cryotherapy;Moist Heat;Energy conservation;Passive range of motion;Therapeutic exercises   Plan issue PWR! hands handout, continue to reinforce adpated strategies with ADLS/ and coordination activities   Consulted and Agree with Plan of Care Patient;Family member/caregiver      Patient will benefit from skilled therapeutic intervention in order to improve the following deficits and impairments:  Decreased coordination, Decreased knowledge of use of DME, Impaired UE functional use, Improper spinal/pelvic alignment, Impaired tone, Impaired perceived functional ability, Decreased balance,  Impaired sensation  Visit Diagnosis: Other lack of coordination  Other symptoms and signs involving the musculoskeletal system  Other symptoms and signs involving the nervous system    Problem List Patient Active Problem List   Diagnosis Date Noted  . Paroxysmal atrial fibrillation ([place]) 02/23/2015  . Chronotropic incompetence with sinus node dysfunction (HCC)   . Pacemaker - [place] serial number [id number] 01/04/2014  . SSS (sick sinus syndrome) ([place]) 12/20/2013  . Chronotropic incompetence with autonomic dysfunction 12/20/2013  . Diabetic peripheral neuropathy ([place]) 06/17/2013  . Transaminasemia 06/17/2013  . Paralysis agitans ([place]) 06/17/2013  . Parkinson's disease ([place]) 06/17/2013  . NSVT (nonsustained ventricular tachycardia) ([place]) 12/18/2012  . Bradycardia 11/26/2012  . Dizziness, nonspecific 11/26/2012  . Cough 01/04/2012  . CARPAL TUNNEL SYNDROME, BILATERAL 01/23/2010  . HEARING LOSS 12/18/2009  . HYPOGLYCEMIA, UNSPECIFIED 10/10/2008  . Seasonal and perennial allergic rhinitis 03/26/2008  . UNS ADVRS EFF UNS RX MEDICINAL&BIOLOGICAL SBSTNC 09/17/2007  . HYPOGONADISM, MALE 04/23/2007  . DEPRESSION, MILD 02/10/2007  . BENIGN PROSTATIC HYPERTROPHY, WITH OBSTRUCTION 02/10/2007  . OSTEOARTHRITIS 02/10/2007  . LOW BACK PAIN 02/10/2007  .  HYPERGLYCEMIA, BORDERLINE 02/10/2007  . Hypothyroidism 10/14/2006  . INTERNAL HEMORRHOIDS 10/14/2006  . Dyslipidemia 09/18/2006    Candus Braud 03/26/2016, 12:19 PM Theone Murdoch, OTR/L [phone] Phone: [phone] 12:19 PM 03/26/16 [facility] [address] [place], [name], [address] Phone: [phone]   Fax:  [phone]  Name: Stephen Robbins MRN: [id number] Date of Birth: August 30, 1935

## 2016-03-26 NOTE — Therapy (Signed)
[facility] [address] [place], [name], [address] Phone: [phone]   Fax:  [phone]  Speech Language Pathology Treatment  Patient Details  Name: Stephen Robbins MRN: [id number] Date of Birth: May 20, 1935 Referring Provider: Alonza Bogus, D.O.  Encounter Date: 03/26/2016      End of Session - 03/26/16 1349    Visit Number 2   Number of Visits 13   Date for SLP Re-Evaluation 05/03/16   SLP Start Time 0933   SLP Stop Time  1016   SLP Time Calculation (min) 43 min   Activity Tolerance Patient tolerated treatment well      Past Medical History:  Diagnosis Date  . Arthritis    "joints; a little" (01/04/2014)  . Asymptomatic bilateral carotid artery stenosis    per duplex  05-15-2012  left >39%/   right 40-59%  . Basal cell carcinoma    nose  . Borderline diabetes   . BPH (benign prostatic hypertrophy) with urinary obstruction   . Bradycardia   . Bronchial pneumonia 1958  . Chronotropic incompetence with sinus node dysfunction (HCC)   . Compressed cervical disc   . Compression of lumbar vertebra (HCC)    L4 -- L5  . Dizzy   . Dysmetabolic syndrome   . Fatigue   . Frequency of urination   . GERD (gastroesophageal reflux disease)    occasional  . Hemorrhoids   . Hyperlipidemia   . Hypothyroidism   . Nocturia   . NSVT (nonsustained ventricular tachycardia) [facility])    cardiologist-  dr croitoru  . Pacemaker   . Urgency of urination   . Wears glasses     Past Surgical History:  Procedure Laterality Date  . BASAL CELL CARCINOMA EXCISION     nose  . CLOSED REDUCTION NASAL FRACTURE  09-01-2007  . CYSTOSCOPY N/A 12/28/2012   Procedure: CYSTOSCOPY;  Surgeon: Bernestine Amass, MD;  Location: [facility];  Service: Urology;  Laterality: N/A;  . EXERCISE TOLERENCE TEST  12-03-2012  DR CROITURO   CHRONOTROPIC INCOMPETENCE/ NORMAL RESTING BP W/ APPROPRIATE RESPONSE/ NO CHEST PAIN/ NO ST CHANGES FROM  BASELINE  . INSERT / REPLACE / REMOVE PACEMAKER  01/04/2014   [employer] model L121 serial number [id number]  . LACERATION REPAIR Right 1978   middle finger  . NEUROPLASTY / TRANSPOSITION ULNAR NERVE AT ELBOW Left 02-09-2010  . PERMANENT PACEMAKER INSERTION N/A 01/04/2014   Procedure: PERMANENT PACEMAKER INSERTION;  Surgeon: Sanda Klein, MD;  Location: [facility] CATH LAB;  Service: Cardiovascular;  Laterality: N/A;  . TONSILLECTOMY AND ADENOIDECTOMY  1944  . TRANSURETHRAL INCISION OF PROSTATE N/A 12/28/2012   Procedure: TRANSURETHRAL INCISION OF THE PROSTATE (TUIP);  Surgeon: Bernestine Amass, MD;  Location[facility];  Service: Urology;  Laterality: N/A;  . ULNAR NERVE TRANSPOSITION      There were no vitals filed for this visit.      Subjective Assessment - 03/26/16 0938    Subjective Pt tells SLP he has "tickle" in his throat. "I'm going to have to change two appointments."   Patient is accompained by: Family member  wife   Currently in Pain? No/denies               ADULT SLP TREATMENT - 03/26/16 0938      General Information   Behavior/Cognition Alert;Cooperative;Pleasant mood     Treatment Provided   Treatment provided Cognitive-Linquistic     Cognitive-Linquistic Treatment   Treatment  focused on Dysarthria   Skilled Treatment SLP used loud /a/ to recalibrate pt's loudness in conversation. SLP observed pt to produce laryngeal push with last two reps so SLP told pt to complete 3 loud /a/ three times per day at home.  In sentence tasks pt maintained louder WNL speech 100% of the time. In side conversation pt req'd usual min A to incr loudness. In multi-sentence tasks, pt  maintained WNL volume 90% of the time. Homework provided.      Assessment / Recommendations / Plan   Plan Continue with current plan of care     Progression Toward Goals   Progression toward goals Progressing toward goals            SLP Short Term Goals - 03/26/16  0939      SLP SHORT TERM GOAL #1   Title pt will produce loud /a/ at average 93dB over three consecutive therapy sessions   Time 4   Period Weeks   Status On-going     SLP SHORT TERM GOAL #2   Title pt will average 71dB in 18/20 sentence responses over three consecutive sessions   Time 4   Period Weeks   Status On-going     SLP SHORT TERM GOAL #3   Title pt will demo 10 minutes simple conversation with average 70dB over three sessions   Time 4   Period Weeks   Status On-going          SLP Long Term Goals - 03/26/16 1352      SLP LONG TERM GOAL #1   Title pt will maintain average 93dB loud /a/ with abdominal breathing and proper voicing over 5 sessions   Time 6   Period Weeks  or 12 therapy sessions before 05-03-26   Status On-going     SLP LONG TERM GOAL #2   Title pt will demo conversational volume of average 71dB over 15 minutes mod-complex/complex conversation over three sessions   Time 6   Period Weeks  or 12 therapy visits before 05-03-16   Status On-going          Plan - 03/26/16 1350    Clinical Impression Statement Decr'd conversational volume was more observed upon entry into ST room. As session cont'd, pt improved his conversational volume more consistently to WNL volume. Pt would cont to benefit from skilled ST to incr loudness in conversations in varying contexts over 10 minutes in length.   Speech Therapy Frequency 2x / week   Duration --  6 weeks, or 13 total visits in 90 days   Treatment/Interventions Compensatory strategies;Patient/family education;Functional tasks;Cueing hierarchy;Internal/external aids;SLP instruction and feedback   Potential to Achieve Goals Good   Consulted and Agree with Plan of Care Patient      Patient will benefit from skilled therapeutic intervention in order to improve the following deficits and impairments:   Dysarthria and anarthria    Problem List Patient Active Problem List   Diagnosis Date Noted  . Paroxysmal  atrial fibrillation ([place]) 02/23/2015  . Chronotropic incompetence with sinus node dysfunction (HCC)   . Pacemaker - [place] serial number [id number] 01/04/2014  . SSS (sick sinus syndrome) ([place]) 12/20/2013  . Chronotropic incompetence with autonomic dysfunction 12/20/2013  . Diabetic peripheral neuropathy ([place]) 06/17/2013  . Transaminasemia 06/17/2013  . Paralysis agitans ([place]) 06/17/2013  . Parkinson's disease ([place]) 06/17/2013  . NSVT (nonsustained ventricular tachycardia) ([facility]) 12/18/2012  . Bradycardia 11/26/2012  . Dizziness, nonspecific 11/26/2012  .  Cough 01/04/2012  . CARPAL TUNNEL SYNDROME, BILATERAL 01/23/2010  . HEARING LOSS 12/18/2009  . HYPOGLYCEMIA, UNSPECIFIED 10/10/2008  . Seasonal and perennial allergic rhinitis 03/26/2008  . UNS ADVRS EFF UNS RX MEDICINAL&BIOLOGICAL SBSTNC 09/17/2007  . HYPOGONADISM, MALE 04/23/2007  . DEPRESSION, MILD 02/10/2007  . BENIGN PROSTATIC HYPERTROPHY, WITH OBSTRUCTION 02/10/2007  . OSTEOARTHRITIS 02/10/2007  . LOW BACK PAIN 02/10/2007  . HYPERGLYCEMIA, BORDERLINE 02/10/2007  . Hypothyroidism 10/14/2006  . INTERNAL HEMORRHOIDS 10/14/2006  . Dyslipidemia 09/18/2006    [facility] ,[place], CCC-SLP  03/26/2016, 1:53 PM  [facility] [address] [place], [name], [address] Phone: [phone]   Fax:  [phone]   Name: Stephen Robbins MRN: [id number] Date of Birth: 1935-06-09

## 2016-03-26 NOTE — Patient Instructions (Signed)
  Please complete the assigned speech therapy homework and return it to your next session.  

## 2016-03-26 NOTE — Patient Instructions (Addendum)
Coordination Exercises  Perform the following exercises for 20 minutes 1 times per day. Perform with both hand(s). Perform using big movements.   Flipping Cards: Place deck of cards on the table. Flip cards over by opening your hand big to grasp and then turn your palm up big.  Deal cards: Hold 1/2 or whole deck in your hand. Use thumb to push card off top of deck with one big push.  Rotate ball with fingertips: Pick up with fingers/thumb and move as much as you can with each turn/movement (clockwise and counter-clockwise).  Toss ball from one hand to the other: Toss big/high.  Rotate 2 golf balls in your hand: Both directions.  Pick up coins and place in coin bank or container: Pick up with big, intentional movements. Do not drag coin to the edge.  Pick up coins and stack one at a time: Pick up with big, intentional movements. Do not drag coin to the edge. (5-10 in a stack)  Pick up 5-10 coins one at a time and hold in palm. Then, move coins from palm to fingertips one at time and place in coin bank/container.  Practice writing: Slow down, write big, and focus on forming each letter.  Perform "Flicks"/hand stretches (PWR! Hands): Close hands then flick out your fingers with focus on opening hands, pulling wrists back, and extending elbows like you are pushing.     Performing Daily Activities with Big Movements  Pick at least one activity a day and perform with BIG, DELIBERATE movements/effort. This can make the activity easier and turn daily activities into exercise!  If you are standing during the activity, make sure to keep feet apart and stand with good/big/PWR! UP posture.  Examples:  Dressing - Push arms in sleeves, twist when putting on jacket, push foot into pants, open hands to pull down shirt/put on socks/pull up pants  Bathing - Wash/dry with long strokes  Brushing your teeth - Big, slow movements  Cutting food - Long deliberate cuts  Opening jar/bottle - Move as  much as you can with each turn  Picking up a cup/bottle - Open hand up big and get object all the way in palm  Hanging up clothes/getting clothes down from closet - Reach with big effort  Putting away groceries/dishes - Reach with big effort  Wiping counter/table - Move in big, long strokes  Stirring while cooking - Exaggerate movement  Cleaning windows - Move in big, long strokes  Sweeping - Move arms in big, long strokes  Vacuuming - Push with big movement  Folding clothes - Exaggerate arm movements  Washing car - Move in big, long strokes  Raking - Move arms in big, long strokes  Changing light bulb - Move as much as you can with each turn  Using a screwdriver - Move as much as you can with each turn  Walking into a store/restaurant - Walk with big steps, swing arms if able  Standing up from a chair/recliner/sofa - Scoot forward, lean forward, and stand with big effort

## 2016-03-28 ENCOUNTER — Ambulatory Visit: Payer: Medicare Other | Admitting: Occupational Therapy

## 2016-03-28 ENCOUNTER — Ambulatory Visit: Payer: Medicare Other | Admitting: *Deleted

## 2016-03-28 ENCOUNTER — Ambulatory Visit (INDEPENDENT_AMBULATORY_CARE_PROVIDER_SITE_OTHER): Payer: Medicare Other | Admitting: *Deleted

## 2016-03-28 DIAGNOSIS — R29818 Other symptoms and signs involving the nervous system: Secondary | ICD-10-CM | POA: Diagnosis not present

## 2016-03-28 DIAGNOSIS — R293 Abnormal posture: Secondary | ICD-10-CM | POA: Diagnosis not present

## 2016-03-28 DIAGNOSIS — R278 Other lack of coordination: Secondary | ICD-10-CM

## 2016-03-28 DIAGNOSIS — R471 Dysarthria and anarthria: Secondary | ICD-10-CM

## 2016-03-28 DIAGNOSIS — R2681 Unsteadiness on feet: Secondary | ICD-10-CM

## 2016-03-28 DIAGNOSIS — R29898 Other symptoms and signs involving the musculoskeletal system: Secondary | ICD-10-CM | POA: Diagnosis not present

## 2016-03-28 DIAGNOSIS — I495 Sick sinus syndrome: Secondary | ICD-10-CM | POA: Diagnosis not present

## 2016-03-28 NOTE — Therapy (Signed)
[facility] [address] [place], [name], [address] Phone: [phone]   Fax:  [phone]  Speech Language Pathology Treatment  Patient Details  Name: Stephen Robbins MRN: [id number] Date of Birth: May 09, 1935 Referring Provider: Alonza Bogus, D.O.  Encounter Date: 03/28/2016      End of Session - 03/28/16 1422    Visit Number 3   Number of Visits 13   Date for SLP Re-Evaluation 05/03/16   SLP Start Time [id number]   SLP Stop Time  1400   SLP Time Calculation (min) 45 min   Activity Tolerance Patient tolerated treatment well      Past Medical History:  Diagnosis Date  . Arthritis    "joints; a little" (01/04/2014)  . Asymptomatic bilateral carotid artery stenosis    per duplex  05-15-2012  left >39%/   right 40-59%  . Basal cell carcinoma    nose  . Borderline diabetes   . BPH (benign prostatic hypertrophy) with urinary obstruction   . Bradycardia   . Bronchial pneumonia 1958  . Chronotropic incompetence with sinus node dysfunction (HCC)   . Compressed cervical disc   . Compression of lumbar vertebra (HCC)    L4 -- L5  . Dizzy   . Dysmetabolic syndrome   . Fatigue   . Frequency of urination   . GERD (gastroesophageal reflux disease)    occasional  . Hemorrhoids   . Hyperlipidemia   . Hypothyroidism   . Nocturia   . NSVT (nonsustained ventricular tachycardia) [facility])    cardiologist-  dr croitoru  . Pacemaker   . Urgency of urination   . Wears glasses     Past Surgical History:  Procedure Laterality Date  . BASAL CELL CARCINOMA EXCISION     nose  . CLOSED REDUCTION NASAL FRACTURE  09-01-2007  . CYSTOSCOPY N/A 12/28/2012   Procedure: CYSTOSCOPY;  Surgeon: Bernestine Amass, MD;  Location: [facility];  Service: Urology;  Laterality: N/A;  . EXERCISE TOLERENCE TEST  12-03-2012  DR CROITURO   CHRONOTROPIC INCOMPETENCE/ NORMAL RESTING BP W/ APPROPRIATE RESPONSE/ NO CHEST PAIN/ NO ST CHANGES FROM  BASELINE  . INSERT / REPLACE / REMOVE PACEMAKER  01/04/2014   [employer] model L121 serial number [id number]  . LACERATION REPAIR Right 1978   middle finger  . NEUROPLASTY / TRANSPOSITION ULNAR NERVE AT ELBOW Left 02-09-2010  . PERMANENT PACEMAKER INSERTION N/A 01/04/2014   Procedure: PERMANENT PACEMAKER INSERTION;  Surgeon: Sanda Klein, MD;  Location: [facility] CATH [facility];  Service: Cardiovascular;  Laterality: N/A;  . TONSILLECTOMY AND ADENOIDECTOMY  1944  . TRANSURETHRAL INCISION OF PROSTATE N/A 12/28/2012   Procedure: TRANSURETHRAL INCISION OF THE PROSTATE (TUIP);  Surgeon: Bernestine Amass, MD;  Location: [facility];  Service: Urology;  Laterality: N/A;  . ULNAR NERVE TRANSPOSITION      There were no vitals filed for this visit.      Subjective Assessment - 03/28/16 1411    Subjective Pt reports "a little gravel" in his voice, which makes producing loud "ah" uncomfortable   Patient is accompained by: Family member  wife [name]   Currently in Pain? No/denies               ADULT SLP TREATMENT - 03/28/16 0001      General Information   Behavior/Cognition Alert;Cooperative;Pleasant mood     Treatment Provided   Treatment provided Cognitive-Linquistic     Cognitive-Linquistic Treatment   Treatment focused on  Dysarthria;Voice;Patient/family/caregiver education   Skilled Treatment Pt seen for skilled ST session targeting loudness goals. Pt reports completing loud /ah/ is "uncomfortable, due to the "tickle" in his throat. Pt was encouraged to stay well-hydrated. SLP reviewed and provided written strategies for breath support/control for effective communication, functional exercises to increase vocal loudness, and dietary and behavioral strategies for management of esophageal dysmotility. Pt reports he has a little difficulty with reflux, but is not taking PPI. Pt reports no weight loss, and no difficulty with swallowing at this time.      Assessment /  Recommendations / Plan   Plan Continue with current plan of care     Progression Toward Goals   Progression toward goals Progressing toward goals          SLP Education - 03/28/16 1422    Education provided Yes   Education Details exercises for increasing loudness, breath support/control for effective communication, strategies for management of esophageal dysmotility.   Person(s) Educated Patient;Spouse   Methods Explanation;Demonstration;Handout   Comprehension Verbalized understanding;Returned demonstration          SLP Short Term Goals - 03/28/16 1426      SLP SHORT TERM GOAL #1   Title pt will produce loud /a/ at average 93dB over three consecutive therapy sessions   Time 4   Period Weeks   Status On-going     SLP SHORT TERM GOAL #2   Title pt will average 71dB in 18/20 sentence responses over three consecutive sessions   Time 4   Period Weeks   Status On-going     SLP SHORT TERM GOAL #3   Title pt will demo 10 minutes simple conversation with average 70dB over three sessions   Time 4   Period Weeks   Status On-going          SLP Long Term Goals - 03/28/16 1426      SLP LONG TERM GOAL #1   Title pt will maintain average 93dB loud /a/ with abdominal breathing and proper voicing over 5 sessions   Time 6   Period Weeks   Status On-going     SLP LONG TERM GOAL #2   Title pt will demo conversational volume of average 71dB over 15 minutes mod-complex/complex conversation over three sessions   Time 6   Period Weeks   Status On-going          Plan - 03/28/16 1423    Clinical Impression Statement Pt reports not seeing an issue with his volume, in fact, he is used to having to speak more loudly because his wife is hard of hearing. Pt was given information on rationale for therapy at this point - to educate, provide strategies, and discuss possible changes in the future (re: swallowing safety, loudness level, cognitive changes) related to the progression of  Parkinson's. Pt indicated he plans to drop frequency to 1x/week due to other commitments.    Speech Therapy Frequency 1x /week  per pt request   Duration --  6 weeks or 13 visits in 90 days   Treatment/Interventions Compensatory strategies;Patient/family education;Functional tasks;Cueing hierarchy;Internal/external aids;SLP instruction and feedback   Potential to Achieve Goals Good   Potential Considerations Family/community support;Ability to learn/carryover information;Cooperation/participation level;Previous level of function   Consulted and Agree with Plan of Care Patient;Family member/caregiver   Family Member Consulted wife [name]      Patient will benefit from skilled therapeutic intervention in order to improve the following deficits and impairments:   Dysarthria and anarthria  Problem List Patient Active Problem List   Diagnosis Date Noted  . Paroxysmal atrial fibrillation ([place]) 02/23/2015  . Chronotropic incompetence with sinus node dysfunction (HCC)   . Pacemaker - [place] serial number [id number] 01/04/2014  . SSS (sick sinus syndrome) ([place]) 12/20/2013  . Chronotropic incompetence with autonomic dysfunction 12/20/2013  . Diabetic peripheral neuropathy ([place]) 06/17/2013  . Transaminasemia 06/17/2013  . Paralysis agitans ([place]) 06/17/2013  . Parkinson's disease ([place]) 06/17/2013  . NSVT (nonsustained ventricular tachycardia) ([place]) 12/18/2012  . Bradycardia 11/26/2012  . Dizziness, nonspecific 11/26/2012  . Cough 01/04/2012  . CARPAL TUNNEL SYNDROME, BILATERAL 01/23/2010  . HEARING LOSS 12/18/2009  . HYPOGLYCEMIA, UNSPECIFIED 10/10/2008  . Seasonal and perennial allergic rhinitis 03/26/2008  . UNS ADVRS EFF UNS RX MEDICINAL&BIOLOGICAL SBSTNC 09/17/2007  . HYPOGONADISM, MALE 04/23/2007  . DEPRESSION, MILD 02/10/2007  . BENIGN PROSTATIC HYPERTROPHY, WITH OBSTRUCTION 02/10/2007  . OSTEOARTHRITIS 02/10/2007  . LOW BACK PAIN 02/10/2007  .  HYPERGLYCEMIA, BORDERLINE 02/10/2007  . Hypothyroidism 10/14/2006  . INTERNAL HEMORRHOIDS 10/14/2006  . Dyslipidemia 09/18/2006   Celia B. [place]ar, MSP, CCC-SLP  Shonna Chock 03/28/2016, 2:29 PM  [facility] [address] [place], [name], [address] Phone: [phone]   Fax:  [phone]   Name: Stephen Robbins MRN: [id number] Date of Birth: 01-24-1936

## 2016-03-28 NOTE — Progress Notes (Signed)
Remote pacemaker transmission.   

## 2016-03-28 NOTE — Patient Instructions (Signed)
PWR! Hands  With arms stretched out in front of you (elbows straight), perform the following:    PWR! Rock: Move wrists up and down [employer]! Twist: Twist palms up and down BIG    Then, start with elbows bent and hands closed.  PWR! Up: Close hands and flick fingers open and apart BIG  PWR! Step: Touch index finger to thumb while keeping other fingers straight. Flick fingers out BIG (thumb out/straighten fingers). Repeat with other fingers. (Step your thumb to each finger).  PWR! Hands: Push hands out BIG. Elbows straight, wrists up, fingers open and spread apart BIG. (Can also perform by pushing down on table, chair, knees. Push above head, out to the side, behind you, in front of you.)   ** Make each movement big and deliberate so that you feel the movement.  Perform at least 10 repetitions 1x/day, but perform PWR! hands throughout the day when you are having trouble using your hands (picking up/manipulating small objects, writing, eating, typing, sewing, buttoning, etc.).

## 2016-03-28 NOTE — Therapy (Signed)
[facility] [address] [place], [name], [address] Phone: [phone]   Fax:  [phone]  Occupational Therapy Treatment  Patient Details  Name: Stephen Robbins MRN: [id number] Date of Birth: 1936-03-12 Referring Provider: Dr. Wells Guiles [name]   Encounter Date: 03/28/2016      OT End of Session - 03/28/16 1555    Visit Number 3   Number of Visits 13   Date for OT Re-Evaluation 04/18/16   Authorization Type Medicare / BCBS (G-code needed)   Authorization Time Period Cert. date through 04/18/16 (may need to modify due to schedule conflicts)   Authorization - Visit Number 3   Authorization - Number of Visits 10   OT Start Time 1400   OT Stop Time 1445   OT Time Calculation (min) 45 min   Activity Tolerance Patient tolerated treatment well      Past Medical History:  Diagnosis Date  . Arthritis    "joints; a little" (01/04/2014)  . Asymptomatic bilateral carotid artery stenosis    per duplex  05-15-2012  left >39%/   right 40-59%  . Basal cell carcinoma    nose  . Borderline diabetes   . BPH (benign prostatic hypertrophy) with urinary obstruction   . Bradycardia   . Bronchial pneumonia 1958  . Chronotropic incompetence with sinus node dysfunction (HCC)   . Compressed cervical disc   . Compression of lumbar vertebra (HCC)    L4 -- L5  . Dizzy   . Dysmetabolic syndrome   . Fatigue   . Frequency of urination   . GERD (gastroesophageal reflux disease)    occasional  . Hemorrhoids   . Hyperlipidemia   . Hypothyroidism   . Nocturia   . NSVT (nonsustained ventricular tachycardia) [facility])    cardiologist-  dr croitoru  . Pacemaker   . Urgency of urination   . Wears glasses     Past Surgical History:  Procedure Laterality Date  . BASAL CELL CARCINOMA EXCISION     nose  . CLOSED REDUCTION NASAL FRACTURE  09-01-2007  . CYSTOSCOPY N/A 12/28/2012   Procedure: CYSTOSCOPY;  Surgeon: Bernestine Amass, MD;  Location:  [facility];  Service: Urology;  Laterality: N/A;  . EXERCISE TOLERENCE TEST  12-03-2012  DR CROITURO   CHRONOTROPIC INCOMPETENCE/ NORMAL RESTING BP W/ APPROPRIATE RESPONSE/ NO CHEST PAIN/ NO ST CHANGES FROM BASELINE  . INSERT / REPLACE / REMOVE PACEMAKER  01/04/2014   [employer] model L121 serial number [id number]  . LACERATION REPAIR Right 1978   middle finger  . NEUROPLASTY / TRANSPOSITION ULNAR NERVE AT ELBOW Left 02-09-2010  . PERMANENT PACEMAKER INSERTION N/A 01/04/2014   Procedure: PERMANENT PACEMAKER INSERTION;  Surgeon: Sanda Klein, MD;  Location: [facility];  Service: Cardiovascular;  Laterality: N/A;  . TONSILLECTOMY AND ADENOIDECTOMY  1944  . TRANSURETHRAL INCISION OF PROSTATE N/A 12/28/2012   Procedure: TRANSURETHRAL INCISION OF THE PROSTATE (TUIP);  Surgeon: Bernestine Amass, MD;  Location: [name];  Service: Urology;  Laterality: N/A;  . ULNAR NERVE TRANSPOSITION      There were no vitals filed for this visit.      Subjective Assessment - 03/28/16 1407    Patient is accompained by: Family member  wife   Pertinent History pacemaker, L ulnar nerve transposition with L 5th decr ext, ?CTR, osteoporosis, hx of low back pain, mild depression, DM, hypothyroidism, hypogonadism.    Limitations PACEMAKER   Patient Stated Goals slow  progression   Currently in Pain? No/denies                      OT Treatments/Exercises (OP) - 03/28/16 0001      ADLs   ADL Comments Pt wished to reduce down to 1x/wk due to inconvenience of getting here. Recommended pt continue at 2x/wk for increased benefit but to further discuss with primary therapist. Pt lives closer to [facility] rehab location and could transfer to this location, however, pt is also requiring speech therapy services and would need to complete speech therapy here before able to transfer to [facility]     Neurological Re-education Exercises   Other Exercises 1 PWR! Hands basic 4 x  10 reps - see pt instructions for details. Explained reasoning behind performing these exercises, and recommended pt perform daily and PWR! Up hands during fine motor tasks if they become difficult     Functional Reaching Activities   Mid Level Standing to perform mid level reaching with weight shifts, trunk rotation, full reaching bilaterally to place pegs in pegboard on vertical surface for dynamic standing, large amplitude movements, and coordination. Pt had no LOB                OT Education - 03/28/16 1436    Education provided Yes   Education Details PWR! Hands, basic 4 x 10   Person(s) Educated Patient   Methods Explanation;Demonstration;Handout   Comprehension Verbalized understanding;Returned demonstration          OT Short Term Goals - 03/28/16 1557      OT SHORT TERM GOAL #1   Title Pt will be independent with PD-specific HEP.--check STGs 04/18/16   Time 4   Period Weeks   Status On-going     OT SHORT TERM GOAL #2   Title Pt will verbalize understanding of ways to prevent future complications related to PD.   Time 4   Period Weeks   Status New     OT SHORT TERM GOAL #3   Title Pt will improve PPT #4 (donning/doffing jacket) by at least 3sec with use of large amplitude movement strategies to prevent future complications.   Time 4   Period Weeks   Status New           OT Long Term Goals - 02/19/16 1933      OT LONG TERM GOAL #1   Title Pt will verbalize understanding of strategies to incr ease/safety of ADLs/IADLs and to prevent future complications related to PD.--check LTGs 05/02/16 (adjusted date due to schedule conflicts)   Time 6   Period Weeks   Status New     OT LONG TERM GOAL #2   Title Pt will improve coordination for ADLs as shown by completing 9-hole peg test in 29sec or less with dominant LUE.   Baseline 32.65sec   Time 6   Period Weeks   Status New     OT LONG TERM GOAL #3   Title Pt will improve bilateral functional reaching for  ADLs as shown by improving score on box and blocks test  by at least 5 bilaterally.   Baseline R-39 blocks, L-42 blocks   Time 6   Period Weeks   Status New     OT LONG TERM GOAL #4   Title Pt will improve efficiency with dressing as shown by buttoning/unbuttoning 3 buttons in 24sec or less.   Baseline 27.06sec   Time 6   Period Weeks  Status New               Plan - 03/28/16 1557    Clinical Impression Statement Pt progressing towards STG's. Pt with greater understanding of reasoning for large amplitude movements.    Rehab Potential Good   OT Frequency 2x / week   OT Duration 6 weeks   OT Treatment/Interventions Self-care/ADL training;Therapeutic exercise;Parrafin;DME and/or AE instruction;Therapist, nutritional;Therapeutic activities;Patient/family education;Cognitive remediation/compensation;Manual Therapy;Splinting;Neuromuscular education;Fluidtherapy;Cryotherapy;Moist Heat;Energy conservation;Passive range of motion;Therapeutic exercises   Plan review coordination HEP, practice donning/doffing jacket, continue large amplitude movements in standing, discuss further with primary O.T. decreasing frequency per pt request    Consulted and Agree with Plan of Care Patient;Family member/caregiver   Family Member Consulted wife      Patient will benefit from skilled therapeutic intervention in order to improve the following deficits and impairments:  Decreased coordination, Decreased knowledge of use of DME, Impaired UE functional use, Improper spinal/pelvic alignment, Impaired tone, Impaired perceived functional ability, Decreased balance, Impaired sensation  Visit Diagnosis: Other lack of coordination  Other symptoms and signs involving the nervous system  Other symptoms and signs involving the musculoskeletal system  Unsteadiness on feet    Problem List Patient Active Problem List   Diagnosis Date Noted  . Paroxysmal atrial fibrillation ([place]) 02/23/2015  .  Chronotropic incompetence with sinus node dysfunction (HCC)   . Pacemaker - [place] serial number [id number] 01/04/2014  . SSS (sick sinus syndrome) ([place]) 12/20/2013  . Chronotropic incompetence with autonomic dysfunction 12/20/2013  . Diabetic peripheral neuropathy ([place]) 06/17/2013  . Transaminasemia 06/17/2013  . Paralysis agitans ([place]) 06/17/2013  . Parkinson's disease ([place]) 06/17/2013  . NSVT (nonsustained ventricular tachycardia) ([place]) 12/18/2012  . Bradycardia 11/26/2012  . Dizziness, nonspecific 11/26/2012  . Cough 01/04/2012  . CARPAL TUNNEL SYNDROME, BILATERAL 01/23/2010  . HEARING LOSS 12/18/2009  . HYPOGLYCEMIA, UNSPECIFIED 10/10/2008  . Seasonal and perennial allergic rhinitis 03/26/2008  . UNS ADVRS EFF UNS RX MEDICINAL&BIOLOGICAL SBSTNC 09/17/2007  . HYPOGONADISM, MALE 04/23/2007  . DEPRESSION, MILD 02/10/2007  . BENIGN PROSTATIC HYPERTROPHY, WITH OBSTRUCTION 02/10/2007  . OSTEOARTHRITIS 02/10/2007  . LOW BACK PAIN 02/10/2007  . HYPERGLYCEMIA, BORDERLINE 02/10/2007  . Hypothyroidism 10/14/2006  . INTERNAL HEMORRHOIDS 10/14/2006  . Dyslipidemia 09/18/2006    Carey Bullocks, OTR/L 03/28/2016, 4:00 PM  [facility] [address] [place], [name], [address] Phone: [phone]   Fax:  [phone]  Name: Stephen Robbins MRN: [id number] Date of Birth: 08-26-1935

## 2016-03-29 ENCOUNTER — Encounter: Payer: Self-pay | Admitting: Cardiology

## 2016-04-01 ENCOUNTER — Encounter: Payer: Self-pay | Admitting: Internal Medicine

## 2016-04-01 DIAGNOSIS — E119 Type 2 diabetes mellitus without complications: Secondary | ICD-10-CM | POA: Diagnosis not present

## 2016-04-01 DIAGNOSIS — H524 Presbyopia: Secondary | ICD-10-CM | POA: Diagnosis not present

## 2016-04-01 DIAGNOSIS — H2513 Age-related nuclear cataract, bilateral: Secondary | ICD-10-CM | POA: Diagnosis not present

## 2016-04-01 DIAGNOSIS — H52203 Unspecified astigmatism, bilateral: Secondary | ICD-10-CM | POA: Diagnosis not present

## 2016-04-01 LAB — HM DIABETES EYE EXAM

## 2016-04-04 ENCOUNTER — Ambulatory Visit: Payer: Medicare Other | Admitting: Speech Pathology

## 2016-04-04 ENCOUNTER — Ambulatory Visit: Payer: Medicare Other | Admitting: Occupational Therapy

## 2016-04-04 DIAGNOSIS — R29818 Other symptoms and signs involving the nervous system: Secondary | ICD-10-CM

## 2016-04-04 DIAGNOSIS — R2689 Other abnormalities of gait and mobility: Secondary | ICD-10-CM

## 2016-04-04 DIAGNOSIS — R278 Other lack of coordination: Secondary | ICD-10-CM

## 2016-04-04 DIAGNOSIS — R2681 Unsteadiness on feet: Secondary | ICD-10-CM

## 2016-04-04 DIAGNOSIS — R29898 Other symptoms and signs involving the musculoskeletal system: Secondary | ICD-10-CM | POA: Diagnosis not present

## 2016-04-04 DIAGNOSIS — G20 Parkinson's disease: Secondary | ICD-10-CM

## 2016-04-04 DIAGNOSIS — R471 Dysarthria and anarthria: Secondary | ICD-10-CM | POA: Diagnosis not present

## 2016-04-04 DIAGNOSIS — R490 Dysphonia: Secondary | ICD-10-CM

## 2016-04-04 DIAGNOSIS — R293 Abnormal posture: Secondary | ICD-10-CM | POA: Diagnosis not present

## 2016-04-04 NOTE — Patient Instructions (Signed)
  Please complete the assigned speech therapy homework and return it to your next session.  

## 2016-04-04 NOTE — Therapy (Signed)
[facility] [address] [place], [name], [address] Phone: [phone]   Fax:  [phone]  Speech Language Pathology Treatment  Patient Details  Name: Stephen Robbins MRN: [id number] Date of Birth: 1935-04-04 Referring Provider: Alonza Bogus, D.O.  Encounter Date: 04/04/2016      End of Session - 04/04/16 1412    Visit Number 4   Number of Visits 13   Date for SLP Re-Evaluation 05/03/16   SLP Start Time [id number]   SLP Stop Time  1400   SLP Time Calculation (min) 45 min   Activity Tolerance Patient tolerated treatment well      Past Medical History:  Diagnosis Date  . Arthritis    "joints; a little" (01/04/2014)  . Asymptomatic bilateral carotid artery stenosis    per duplex  05-15-2012  left >39%/   right 40-59%  . Basal cell carcinoma    nose  . Borderline diabetes   . BPH (benign prostatic hypertrophy) with urinary obstruction   . Bradycardia   . Bronchial pneumonia 1958  . Chronotropic incompetence with sinus node dysfunction (HCC)   . Compressed cervical disc   . Compression of lumbar vertebra (HCC)    L4 -- L5  . Dizzy   . Dysmetabolic syndrome   . Fatigue   . Frequency of urination   . GERD (gastroesophageal reflux disease)    occasional  . Hemorrhoids   . Hyperlipidemia   . Hypothyroidism   . Nocturia   . NSVT (nonsustained ventricular tachycardia) [facility])    cardiologist-  dr croitoru  . Pacemaker   . Urgency of urination   . Wears glasses     Past Surgical History:  Procedure Laterality Date  . BASAL CELL CARCINOMA EXCISION     nose  . CLOSED REDUCTION NASAL FRACTURE  09-01-2007  . CYSTOSCOPY N/A 12/28/2012   Procedure: CYSTOSCOPY;  Surgeon: Bernestine Amass, MD;  Location: [facility];  Service: Urology;  Laterality: N/A;  . EXERCISE TOLERENCE TEST  12-03-2012  DR CROITURO   CHRONOTROPIC INCOMPETENCE/ NORMAL RESTING BP W/ APPROPRIATE RESPONSE/ NO CHEST PAIN/ NO ST CHANGES  FROM BASELINE  . INSERT / REPLACE / REMOVE PACEMAKER  01/04/2014   [employer] model L121 serial number [id number]  . LACERATION REPAIR Right 1978   middle finger  . NEUROPLASTY / TRANSPOSITION ULNAR NERVE AT ELBOW Left 02-09-2010  . PERMANENT PACEMAKER INSERTION N/A 01/04/2014   Procedure: PERMANENT PACEMAKER INSERTION;  Surgeon: Sanda Klein, MD;  Location: [facility];  Service: Cardiovascular;  Laterality: N/A;  . TONSILLECTOMY AND ADENOIDECTOMY  1944  . TRANSURETHRAL INCISION OF PROSTATE N/A 12/28/2012   Procedure: TRANSURETHRAL INCISION OF THE PROSTATE (TUIP);  Surgeon: Bernestine Amass, MD;  Location: [facility];  Service: Urology;  Laterality: N/A;  . ULNAR NERVE TRANSPOSITION      There were no vitals filed for this visit.      Subjective Assessment - 04/04/16 1341    Subjective Patient reports he's been doing exercises, but "not as much as I should be."   Patient is accompained by: Family member   Currently in Pain? No/denies               ADULT SLP TREATMENT - 04/04/16 0001      General Information   Behavior/Cognition Alert;Cooperative;Pleasant mood   Patient Positioning Upright in chair     Treatment Provided   Treatment provided Cognitive-Linquistic     Pain Assessment  Pain Assessment No/denies pain     Cognitive-Linquistic Treatment   Treatment focused on Dysarthria;Voice;Patient/family/caregiver education   Skilled Treatment SLP used loud /a/ to recalibrate pt's loudness in conversation. Patient produced 5 repetitions at an average of 87dB. Patient benefitted from demonstration and verbal cues to improve quality and use of abdominal breathing techniques. Progressed to phrase and sentence level tasks with good technique and average of 84dB. Provided education regarding sensory perceptions of vocal effort. Progressed to multi-sentence level task with increased cognitive load; patient averaged 82 dB with min A cues. Spontaneous  responses averaged 65 dB; with verbal cues for increased effort, patient improved to 71.6 dB in structured conversational tasks. Sustained /a/ avg:  87 dB Sentence avg:  84 dB Paragraph avg:  82 dB Structured Conversation avg: 71.6 dB Spontaneous responses: 65 dB       Assessment / Recommendations / Plan   Plan Continue with current plan of care     Progression Toward Goals   Progression toward goals Progressing toward goals          SLP Education - 04/04/16 1411    Education provided Yes   Education Details breath support/control for effective communication, sensory perceptions of loudness and effort   Person(s) Educated Patient;Spouse   Methods Explanation;Demonstration   Comprehension Verbalized understanding;Returned demonstration          SLP Short Term Goals - 04/04/16 1419      SLP SHORT TERM GOAL #1   Title pt will produce loud /a/ at average 93dB over three consecutive therapy sessions   Time 3   Period Weeks   Status On-going     SLP SHORT TERM GOAL #2   Title pt will average 71dB in 18/20 sentence responses over three consecutive sessions   Baseline 04/04/16   Time 3   Status On-going          SLP Long Term Goals - 04/04/16 1419      SLP LONG TERM GOAL #1   Title pt will maintain average 93dB loud /a/ with abdominal breathing and proper voicing over 5 sessions   Time 5   Period Weeks     SLP LONG TERM GOAL #2   Title pt will demo conversational volume of average 71dB over 15 minutes mod-complex/complex conversation over three sessions   Time 5   Period Weeks   Status On-going          Plan - 04/04/16 1412    Clinical Impression Statement Patient questioned whether he needs to increase loudness in conversation. He benefitted from use of objective data recordings to demonstrate differences between volume when using increased effort vs. spontaneous responses. He may benefit from audio recordings for biofeedback.    Speech Therapy Frequency 1x  /week   Treatment/Interventions Compensatory strategies;Patient/family education;Functional tasks;Cueing hierarchy;Internal/external aids;SLP instruction and feedback   Potential to Achieve Goals Good   Potential Considerations Family/community support;Ability to learn/carryover information;Cooperation/participation level;Previous level of function   SLP Home Exercise Plan Homework assigned   Consulted and Agree with Plan of Care Patient;Family member/caregiver   Family Member Consulted wife [name]      Patient will benefit from skilled therapeutic intervention in order to improve the following deficits and impairments:   Hypokinetic Parkinsonian dysphonia  Dysarthria and anarthria    Problem List Patient Active Problem List   Diagnosis Date Noted  . Paroxysmal atrial fibrillation ([place]) 02/23/2015  . Chronotropic incompetence with sinus node dysfunction (HCC)   . Pacemaker - [employer] model L121 serial number [id number] 01/04/2014  . SSS (sick sinus syndrome) ([place]) 12/20/2013  . Chronotropic incompetence with autonomic dysfunction 12/20/2013  . Diabetic peripheral neuropathy ([place]) 06/17/2013  . Transaminasemia 06/17/2013  . Paralysis agitans ([place]) 06/17/2013  . Parkinson's disease ([place]) 06/17/2013  . NSVT (nonsustained ventricular tachycardia) ([place]) 12/18/2012  . Bradycardia 11/26/2012  . Dizziness, nonspecific 11/26/2012  . Cough 01/04/2012  . CARPAL TUNNEL SYNDROME, BILATERAL 01/23/2010  . HEARING LOSS 12/18/2009  . HYPOGLYCEMIA, UNSPECIFIED 10/10/2008  . Seasonal and perennial allergic rhinitis 03/26/2008  . UNS ADVRS EFF UNS RX MEDICINAL&BIOLOGICAL SBSTNC 09/17/2007  . HYPOGONADISM, MALE 04/23/2007  . DEPRESSION, MILD 02/10/2007  . BENIGN PROSTATIC HYPERTROPHY, WITH OBSTRUCTION 02/10/2007  . OSTEOARTHRITIS 02/10/2007  . LOW BACK PAIN 02/10/2007  . HYPERGLYCEMIA, BORDERLINE 02/10/2007  . Hypothyroidism 10/14/2006  . INTERNAL HEMORRHOIDS 10/14/2006  .  Dyslipidemia 09/18/2006   Deneise Lever, [place] CF-SLP Speech-Language Pathologist  Aliene Altes 04/04/2016, 2:21 PM  [facility] [address] [place], [name], [address] Phone: [phone]   Fax:  [phone]   Name: CAYNE KLESS MRN: [id number] Date of Birth: Aug 29, 1935

## 2016-04-04 NOTE — Therapy (Signed)
[facility] [address] [place], [name], [address] Phone: [phone]   Fax:  [phone]  Occupational Therapy Treatment  Patient Details  Name: Stephen Robbins MRN: [id number] Date of Birth: 1935-10-30 Referring Provider: Dr. Wells Guiles [name]   Encounter Date: 04/04/2016      OT End of Session - 04/04/16 1735    Visit Number 4   Number of Visits 13   Date for OT Re-Evaluation 04/18/16   Authorization Type Medicare / BCBS (G-code needed)   Authorization Time Period Cert. date through 04/18/16 (may need to modify due to schedule conflicts)   Authorization - Visit Number 4   Authorization - Number of Visits 10   OT Start Time 1405   OT Stop Time 1445   OT Time Calculation (min) 40 min   Activity Tolerance Patient tolerated treatment well   Behavior During Therapy [facility] for tasks assessed/performed      Past Medical History:  Diagnosis Date  . Arthritis    "joints; a little" (01/04/2014)  . Asymptomatic bilateral carotid artery stenosis    per duplex  05-15-2012  left >39%/   right 40-59%  . Basal cell carcinoma    nose  . Borderline diabetes   . BPH (benign prostatic hypertrophy) with urinary obstruction   . Bradycardia   . Bronchial pneumonia 1958  . Chronotropic incompetence with sinus node dysfunction (HCC)   . Compressed cervical disc   . Compression of lumbar vertebra (HCC)    L4 -- L5  . Dizzy   . Dysmetabolic syndrome   . Fatigue   . Frequency of urination   . GERD (gastroesophageal reflux disease)    occasional  . Hemorrhoids   . Hyperlipidemia   . Hypothyroidism   . Nocturia   . NSVT (nonsustained ventricular tachycardia) [facility])    cardiologist-  dr croitoru  . Pacemaker   . Urgency of urination   . Wears glasses     Past Surgical History:  Procedure Laterality Date  . BASAL CELL CARCINOMA EXCISION     nose  . CLOSED REDUCTION NASAL FRACTURE  09-01-2007  . CYSTOSCOPY N/A 12/28/2012    Procedure: CYSTOSCOPY;  Surgeon: Bernestine Amass, MD;  Location: [facility];  Service: Urology;  Laterality: N/A;  . EXERCISE TOLERENCE TEST  12-03-2012  DR CROITURO   CHRONOTROPIC INCOMPETENCE/ NORMAL RESTING BP W/ APPROPRIATE RESPONSE/ NO CHEST PAIN/ NO ST CHANGES FROM BASELINE  . INSERT / REPLACE / REMOVE PACEMAKER  01/04/2014   [employer] model L121 serial number [id number]  . LACERATION REPAIR Right 1978   middle finger  . NEUROPLASTY / TRANSPOSITION ULNAR NERVE AT ELBOW Left 02-09-2010  . PERMANENT PACEMAKER INSERTION N/A 01/04/2014   Procedure: PERMANENT PACEMAKER INSERTION;  Surgeon: Sanda Klein, MD;  Location: [facility];  Service: Cardiovascular;  Laterality: N/A;  . TONSILLECTOMY AND ADENOIDECTOMY  1944  . TRANSURETHRAL INCISION OF PROSTATE N/A 12/28/2012   Procedure: TRANSURETHRAL INCISION OF THE PROSTATE (TUIP);  Surgeon: Bernestine Amass, MD;  Location: [facility];  Service: Urology;  Laterality: N/A;  . ULNAR NERVE TRANSPOSITION      There were no vitals filed for this visit.      Subjective Assessment - 04/04/16 1407    Subjective  Pt reports questions about coordination HEP   Patient is accompained by: Family member  wife   Pertinent History pacemaker, L ulnar nerve transposition with L 5th decr ext, ?CTR, osteoporosis, hx of  low back pain, mild depression, DM, hypothyroidism, hypogonadism.    Limitations PACEMAKER   Patient Stated Goals slow progression   Currently in Pain? No/denies        Continued education regarding use of large amplitude movement strategies for incr ease with ADLs/IADLs and to prevent future complications.  Pt/wife verbalized understanding.  Verbally reviewed use of large amplitude movements for cutting food (pt reports that it did help) and for functional reaching with focus of keeping feet apart, extending elbow.  Pt verbalized understanding.  Dressing:   Practiced buttoning/unbuttoning shirt  with min cues for use of PWR! Hands prior to buttoning and use of deliberate/large amplitude movements after instruction.  Pt demo particular difficulty with R sleeve (using LUE); however, pt demo improvement with with use of large amplitude movements/PWR! hands after cueing.  Practiced donning/doffing jacket using large amplitude movement strategies after initial instruction.  Pt demo improvement with use/min cues for large amplitude movements (particularly for incr trunk rotation for doffing).                            OT Education - 04/04/16 1733    Education Details Reviewed coordination HEP (including purpose and how PD affects performance)   Person(s) Educated Patient;Spouse   Methods Explanation;Demonstration;Verbal cues   Comprehension Verbalized understanding;Returned demonstration;Verbal cues required  min v.c.          OT Short Term Goals - 03/28/16 1557      OT SHORT TERM GOAL #1   Title Pt will be independent with PD-specific HEP.--check STGs 04/18/16   Time 4   Period Weeks   Status On-going     OT SHORT TERM GOAL #2   Title Pt will verbalize understanding of ways to prevent future complications related to PD.   Time 4   Period Weeks   Status New     OT SHORT TERM GOAL #3   Title Pt will improve PPT #4 (donning/doffing jacket) by at least 3sec with use of large amplitude movement strategies to prevent future complications.   Time 4   Period Weeks   Status New           OT Long Term Goals - 02/19/16 1933      OT LONG TERM GOAL #1   Title Pt will verbalize understanding of strategies to incr ease/safety of ADLs/IADLs and to prevent future complications related to PD.--check LTGs 05/02/16 (adjusted date due to schedule conflicts)   Time 6   Period Weeks   Status New     OT LONG TERM GOAL #2   Title Pt will improve coordination for ADLs as shown by completing 9-hole peg test in 29sec or less with dominant LUE.   Baseline 32.65sec    Time 6   Period Weeks   Status New     OT LONG TERM GOAL #3   Title Pt will improve bilateral functional reaching for ADLs as shown by improving score on box and blocks test  by at least 5 bilaterally.   Baseline R-39 blocks, L-42 blocks   Time 6   Period Weeks   Status New     OT LONG TERM GOAL #4   Title Pt will improve efficiency with dressing as shown by buttoning/unbuttoning 3 buttons in 24sec or less.   Baseline 27.06sec   Time 6   Period Weeks   Status New  Plan - 04/04/16 1735    Clinical Impression Statement Pt is progressing towards goals.  Pt demo incr understanding of coordination HEP and awareness of PD related deficits after review today.   Rehab Potential Good   OT Frequency 2x / week   OT Duration 6 weeks   OT Treatment/Interventions Self-care/ADL training;Therapeutic exercise;Parrafin;DME and/or AE instruction;Therapist, nutritional;Therapeutic activities;Patient/family education;Cognitive remediation/compensation;Manual Therapy;Splinting;Neuromuscular education;Fluidtherapy;Cryotherapy;Moist Heat;Energy conservation;Passive range of motion;Therapeutic exercises   Plan review PWR! Hands HEP; writing; continue with large amplitude movements for ADLs/IADLs   OT Home Exercise Plan Education Provided:  coordination HEP, Using large amplitude movements for ADLs, PWR! hands   Consulted and Agree with Plan of Care Patient;Family member/caregiver   Family Member Consulted wife      Patient will benefit from skilled therapeutic intervention in order to improve the following deficits and impairments:  Decreased coordination, Decreased knowledge of use of DME, Impaired UE functional use, Improper spinal/pelvic alignment, Impaired tone, Impaired perceived functional ability, Decreased balance, Impaired sensation  Visit Diagnosis: Other symptoms and signs involving the nervous system  Other symptoms and signs involving the musculoskeletal  system  Unsteadiness on feet  Posture abnormality  Other lack of coordination  Other abnormalities of gait and mobility    Problem List Patient Active Problem List   Diagnosis Date Noted  . Paroxysmal atrial fibrillation ([place]) 02/23/2015  . Chronotropic incompetence with sinus node dysfunction (HCC)   . Pacemaker - [employer] serial number [id number] 01/04/2014  . SSS (sick sinus syndrome) ([place]) 12/20/2013  . Chronotropic incompetence with autonomic dysfunction 12/20/2013  . Diabetic peripheral neuropathy ([place]) 06/17/2013  . Transaminasemia 06/17/2013  . Paralysis agitans ([place]) 06/17/2013  . Parkinson's disease ([place]) 06/17/2013  . NSVT (nonsustained ventricular tachycardia) ([place]) 12/18/2012  . Bradycardia 11/26/2012  . Dizziness, nonspecific 11/26/2012  . Cough 01/04/2012  . CARPAL TUNNEL SYNDROME, BILATERAL 01/23/2010  . HEARING LOSS 12/18/2009  . HYPOGLYCEMIA, UNSPECIFIED 10/10/2008  . Seasonal and perennial allergic rhinitis 03/26/2008  . UNS ADVRS EFF UNS RX MEDICINAL&BIOLOGICAL SBSTNC 09/17/2007  . HYPOGONADISM, MALE 04/23/2007  . DEPRESSION, MILD 02/10/2007  . BENIGN PROSTATIC HYPERTROPHY, WITH OBSTRUCTION 02/10/2007  . OSTEOARTHRITIS 02/10/2007  . LOW BACK PAIN 02/10/2007  . HYPERGLYCEMIA, BORDERLINE 02/10/2007  . Hypothyroidism 10/14/2006  . INTERNAL HEMORRHOIDS 10/14/2006  . Dyslipidemia 09/18/2006    [facility] 04/04/2016, 5:38 PM  [facility] [address] [place], [name], [address] Phone: [phone]   Fax:  [phone]  Name: AMAIR QUIST MRN: [id number] Date of Birth: Feb 18, 1936   Vianne Bulls, OTR/L [facility] [address] [place],   [address] [phone] phone [phone] 04/04/16 5:42 PM

## 2016-04-05 ENCOUNTER — Ambulatory Visit: Payer: Medicare Other

## 2016-04-05 ENCOUNTER — Ambulatory Visit: Payer: Medicare Other | Admitting: Occupational Therapy

## 2016-04-09 ENCOUNTER — Ambulatory Visit: Payer: Medicare Other | Admitting: Occupational Therapy

## 2016-04-09 ENCOUNTER — Ambulatory Visit: Payer: Medicare Other

## 2016-04-11 ENCOUNTER — Encounter: Payer: Medicare Other | Admitting: Occupational Therapy

## 2016-04-11 ENCOUNTER — Encounter: Payer: Medicare Other | Admitting: Speech Pathology

## 2016-04-16 ENCOUNTER — Ambulatory Visit: Payer: Medicare Other | Admitting: Occupational Therapy

## 2016-04-16 ENCOUNTER — Ambulatory Visit: Payer: Medicare Other | Admitting: Speech Pathology

## 2016-04-16 DIAGNOSIS — L821 Other seborrheic keratosis: Secondary | ICD-10-CM | POA: Diagnosis not present

## 2016-04-16 DIAGNOSIS — D1801 Hemangioma of skin and subcutaneous tissue: Secondary | ICD-10-CM | POA: Diagnosis not present

## 2016-04-16 DIAGNOSIS — Z85828 Personal history of other malignant neoplasm of skin: Secondary | ICD-10-CM | POA: Diagnosis not present

## 2016-04-16 DIAGNOSIS — L570 Actinic keratosis: Secondary | ICD-10-CM | POA: Diagnosis not present

## 2016-04-16 DIAGNOSIS — L300 Nummular dermatitis: Secondary | ICD-10-CM | POA: Diagnosis not present

## 2016-04-16 LAB — CUP PACEART REMOTE DEVICE CHECK
Battery Remaining Longevity: 162 mo
Battery Remaining Percentage: 100 %
Brady Statistic RA Percent Paced: 44 %
Brady Statistic RV Percent Paced: 2 %
Date Time Interrogation Session: 20180104125400
Implantable Lead Implant Date: 20151013
Implantable Lead Implant Date: 20151013
Implantable Lead Location: 753859
Implantable Lead Location: 753860
Implantable Lead Model: 4135
Implantable Lead Model: 4136
Implantable Lead Serial Number: 29480373
Implantable Lead Serial Number: 29608302
Implantable Pulse Generator Implant Date: 20151013
Lead Channel Impedance Value: 584 Ohm
Lead Channel Impedance Value: 654 Ohm
Lead Channel Setting Pacing Amplitude: 2 V
Lead Channel Setting Pacing Amplitude: 4 V
Lead Channel Setting Pacing Pulse Width: 1 ms
Lead Channel Setting Sensing Sensitivity: 2.5 mV
Pulse Gen Serial Number: 702951

## 2016-04-18 ENCOUNTER — Ambulatory Visit: Payer: Medicare Other | Admitting: Speech Pathology

## 2016-04-18 ENCOUNTER — Ambulatory Visit: Payer: Medicare Other | Admitting: Occupational Therapy

## 2016-04-18 DIAGNOSIS — R29898 Other symptoms and signs involving the musculoskeletal system: Secondary | ICD-10-CM

## 2016-04-18 DIAGNOSIS — R2681 Unsteadiness on feet: Secondary | ICD-10-CM

## 2016-04-18 DIAGNOSIS — R29818 Other symptoms and signs involving the nervous system: Secondary | ICD-10-CM

## 2016-04-18 DIAGNOSIS — R2689 Other abnormalities of gait and mobility: Secondary | ICD-10-CM

## 2016-04-18 DIAGNOSIS — R471 Dysarthria and anarthria: Secondary | ICD-10-CM | POA: Diagnosis not present

## 2016-04-18 DIAGNOSIS — R293 Abnormal posture: Secondary | ICD-10-CM | POA: Diagnosis not present

## 2016-04-18 DIAGNOSIS — R278 Other lack of coordination: Secondary | ICD-10-CM | POA: Diagnosis not present

## 2016-04-18 DIAGNOSIS — G20 Parkinson's disease: Secondary | ICD-10-CM

## 2016-04-18 DIAGNOSIS — R490 Dysphonia: Secondary | ICD-10-CM

## 2016-04-18 NOTE — Therapy (Signed)
[facility] [address] [place], [name], [address] Phone: [phone]   Fax:  [phone]  Occupational Therapy Treatment  Patient Details  Name: Stephen Robbins MRN: [id number] Date of Birth: [birth year] Referring Provider: Dr. Wells Guiles [name]   Encounter Date: 04/18/2016      OT End of Session - 04/18/16 1850    Visit Number 5   Number of Visits 13   Date for OT Re-Evaluation 04/18/16   Authorization Type Medicare / BCBS (G-code needed)   Authorization Time Period Renewal completed 6/71/24 (cert. date 04/18/16-06/15/16)   Authorization - Visit Number 5   Authorization - Number of Visits 10   OT Start Time 1406   OT Stop Time 1446   OT Time Calculation (min) 40 min   Activity Tolerance Patient tolerated treatment well   Behavior During Therapy W[facility] for tasks assessed/performed      Past Medical History:  Diagnosis Date  . Arthritis    "joints; a little" (01/04/2014)  . Asymptomatic bilateral carotid artery stenosis    per duplex  05-15-2012  left >39%/   right 40-59%  . Basal cell carcinoma    nose  . Borderline diabetes   . BPH (benign prostatic hypertrophy) with urinary obstruction   . Bradycardia   . Bronchial pneumonia 1958  . Chronotropic incompetence with sinus node dysfunction (HCC)   . Compressed cervical disc   . Compression of lumbar vertebra (HCC)    L4 -- L5  . Dizzy   . Dysmetabolic syndrome   . Fatigue   . Frequency of urination   . GERD (gastroesophageal reflux disease)    occasional  . Hemorrhoids   . Hyperlipidemia   . Hypothyroidism   . Nocturia   . NSVT (nonsustained ventricular tachycardia) [facility])    cardiologist-  dr croitoru  . Pacemaker   . Urgency of urination   . Wears glasses     Past Surgical History:  Procedure Laterality Date  . BASAL CELL CARCINOMA EXCISION     nose  . CLOSED REDUCTION NASAL FRACTURE  09-01-2007  . CYSTOSCOPY N/A 12/28/2012   Procedure: CYSTOSCOPY;   Surgeon: Bernestine Amass, MD;  [facility]: [facility];  Service: Urology;  Laterality: N/A;  . EXERCISE TOLERENCE TEST  12-03-2012  DR CROITURO   CHRONOTROPIC INCOMPETENCE/ NORMAL RESTING BP W/ APPROPRIATE RESPONSE/ NO CHEST PAIN/ NO ST CHANGES FROM BASELINE  . INSERT / REPLACE / REMOVE PACEMAKER  01/04/2014   [employer] model L121 serial number [id number]  . LACERATION REPAIR Right 1978   middle finger  . NEUROPLASTY / TRANSPOSITION ULNAR NERVE AT ELBOW Left 02-09-2010  . PERMANENT PACEMAKER INSERTION N/A 01/04/2014   Procedure: PERMANENT PACEMAKER INSERTION;  Surgeon: Sanda Klein, MD;  Location: [facility];  Service: Cardiovascular;  Laterality: N/A;  . TONSILLECTOMY AND ADENOIDECTOMY  1944  . TRANSURETHRAL INCISION OF PROSTATE N/A 12/28/2012   Procedure: TRANSURETHRAL INCISION OF THE PROSTATE (TUIP);  Surgeon: Bernestine Amass, MD;  Location[facility];  Service: Urology;  Laterality: N/A;  . ULNAR NERVE TRANSPOSITION      There were no vitals filed for this visit.      Subjective Assessment - 04/18/16 1846    Subjective  Pt may decr frequency some weeks due to other appts.  Pt verbalizes desire to continue occupational therapy for prevention of future complications   Patient is accompained by: Family member  wife   Pertinent History pacemaker, L ulnar  nerve transposition with L 5th decr ext, ?CTR, osteoporosis, hx of low back pain, mild depression, DM, hypothyroidism, hypogonadism.    Limitations PACEMAKER   Patient Stated Goals slow progression   Currently in Pain? No/denies       Self Care:    Educated pt in importance and in use of large amplitude movements to prevent future complications related to PD.  Pt verbalized understanding.--Reviewed handout (see pt instructions)   Eating:  Instructed pt in strategies for eating including holding utensil in the middle vs. The end, use of big intentional movements.  Pt verbalized  understanding and returned demo after practice.  Opening/closing bottles with use of large amplitude movement strategies with min-mod cueing.   Dressing: Practiced simulated donning/doffing pants with focus on large amplitude movement strategies including deliberate step, reach with UEs and forward flex.  Pt needed min cueing to perform and for incr awareness of movement.   Functional mobility:   Sit>stand with min cues for large amplitude movement technique (including forward reach).  Emphasized importance of large base of support/feet apart, trunk rotation, and wt. Shift with ADLs/IADLs.    Education provided regarding etiology of PD and pt verbalized understanding.                      OT Education - 04/18/16 1848    Education Details Decr awareness of movement amplitude/changes affecting ADLs; importance of large amplitude movement strategies for ADLs to incr ease and prevent future complication related to PD   Person(s) Educated Patient;Spouse   Methods Explanation;Demonstration;Verbal cues;Handout   Comprehension Verbal cues required;Returned demonstration;Verbalized understanding  min cueing          OT Short Term Goals - 04/18/16 1857      OT SHORT TERM GOAL #1   Title Pt will be independent with PD-specific HEP.--check STGs 05/16/16   Time 4   Period Weeks   Status On-going  04/18/16:  not fully met, min cues     OT SHORT TERM GOAL #2   Title Pt will verbalize understanding of ways to prevent future complications related to PD.   Time 4   Period Weeks   Status Achieved  04/18/16:  met     OT SHORT TERM GOAL #3   Title Pt will improve PPT #4 (donning/doffing jacket) by at least 3sec with use of large amplitude movement strategies to prevent future complications.   Time 4   Period Weeks   Status New           OT Long Term Goals - 04/18/16 1858      OT LONG TERM GOAL #1   Title Pt will verbalize understanding of strategies to incr  ease/safety of ADLs/IADLs and to prevent future complications related to PD.--check LTGs 05/30/16    Time 6   Period Weeks   Status On-going  04/18/16:  continues to need min cues due to decr awareness     OT LONG TERM GOAL #2   Title Pt will improve coordination for ADLs as shown by completing 9-hole peg test in 29sec or less with dominant LUE.   Baseline 32.65sec   Time 6   Period Weeks   Status On-going     OT LONG TERM GOAL #3   Title Pt will improve bilateral functional reaching for ADLs as shown by improving score on box and blocks test  by at least 5 bilaterally.   Baseline R-39 blocks, L-42 blocks   Time 6  Period Weeks   Status On-going     OT LONG TERM GOAL #4   Title Pt will improve efficiency with dressing as shown by buttoning/unbuttoning 3 buttons in 24sec or less.   Baseline 27.06sec   Time 6   Period Weeks   Status On-going               Plan - 04/18/16 1852    Clinical Impression Statement Pt is progressing towards goals, but continues to demo decr awareness of PD related deficits at times affecting calibration and carryover.  Emphasized importance of large amplitude movements in ADLs for prevention of future complications.  Pt would benefit from continued occupational therapy to incr awareness of deficts, improve calibration of normal amplitude movement patterns, and prevent future complications.     Rehab Potential Good   OT Frequency --  8 visit over 6 weeks   OT Duration 6 weeks   OT Treatment/Interventions Self-care/ADL training;Therapeutic exercise;Parrafin;DME and/or AE instruction;Therapist, nutritional;Therapeutic activities;Patient/family education;Cognitive remediation/compensation;Manual Therapy;Splinting;Neuromuscular education;Fluidtherapy;Cryotherapy;Moist Heat;Energy conservation;Passive range of motion;Therapeutic exercises   Plan review PWR! hands HEP; writing; large amplitude movements for ADLs/IADLs and functional reaching;  Renewal  completed 04/18/16 with continued unmet goals as all goals not met/not fully addressed as pt has not been seen frequency due to illness, inclement weather, and schedule appointments.   OT Home Exercise Plan Education Provided:  coordination HEP, Using large amplitude movements for ADLs, PWR! hands   Consulted and Agree with Plan of Care Patient;Family member/caregiver   Family Member Consulted wife      Patient will benefit from skilled therapeutic intervention in order to improve the following deficits and impairments:  Decreased coordination, Decreased knowledge of use of DME, Impaired UE functional use, Improper spinal/pelvic alignment, Impaired tone, Impaired perceived functional ability, Decreased balance, Impaired sensation  Visit Diagnosis: Other symptoms and signs involving the nervous system  Other symptoms and signs involving the musculoskeletal system  Posture abnormality  Other lack of coordination  Unsteadiness on feet  Other abnormalities of gait and mobility    Problem List Patient Active Problem List   Diagnosis Date Noted  . Paroxysmal atrial fibrillation ([place]) 02/23/2015  . Chronotropic incompetence with sinus node dysfunction (HCC)   . Pacemaker - [employer] serial number [id number] 01/04/2014  . SSS (sick sinus syndrome) ([place]) 12/20/2013  . Chronotropic incompetence with autonomic dysfunction 12/20/2013  . Diabetic peripheral neuropathy ([place]) 06/17/2013  . Transaminasemia 06/17/2013  . Paralysis agitans ([place]) 06/17/2013  . Parkinson's disease ([place]) 06/17/2013  . NSVT (nonsustained ventricular tachycardia) ([place]) 12/18/2012  . Bradycardia 11/26/2012  . Dizziness, nonspecific 11/26/2012  . Cough 01/04/2012  . CARPAL TUNNEL SYNDROME, BILATERAL 01/23/2010  . HEARING LOSS 12/18/2009  . HYPOGLYCEMIA, UNSPECIFIED 10/10/2008  . Seasonal and perennial allergic rhinitis 03/26/2008  . UNS ADVRS EFF UNS RX MEDICINAL&BIOLOGICAL SBSTNC 09/17/2007   . HYPOGONADISM, MALE 04/23/2007  . DEPRESSION, MILD 02/10/2007  . BENIGN PROSTATIC HYPERTROPHY, WITH OBSTRUCTION 02/10/2007  . OSTEOARTHRITIS 02/10/2007  . LOW BACK PAIN 02/10/2007  . HYPERGLYCEMIA, BORDERLINE 02/10/2007  . Hypothyroidism 10/14/2006  . INTERNAL HEMORRHOIDS 10/14/2006  . Dyslipidemia 09/18/2006   Occupational Therapy Progress Note  Dates of Reporting Period: 02/19/16 to 04/18/16  Objective Reports of Subjective Statement: see above  Objective Measurements: see above  Goal Update: see above, all goals not yet fully addressed or reassessed due to decr frequency  Plan: see above  Reason Skilled Services are Required: see above clinical impression/plan section    [facility] 04/18/2016, 7:28  PM  [facility] [address] [place], [name], [address] Phone: [phone]   Fax:  [phone]  Name: Stephen Robbins MRN: [id number] Date of Birth: 04/06/1935   Vianne Bulls, OTR/L [facility] [address] [place], [place]  [address] [phone] phone [phone] 04/18/16 7:28 PM

## 2016-04-18 NOTE — Patient Instructions (Signed)
Homework assigned 

## 2016-04-18 NOTE — Therapy (Signed)
[facility] [address] [place], [name], [address] Phone: [phone]   Fax:  [phone]  Speech Language Pathology Treatment  Patient Details  Name: Stephen Robbins MRN: [id number] Date of Birth: 16-Sep-1935 Referring Provider: Alonza Bogus, D.O.  Encounter Date: 04/18/2016      End of Session - 04/18/16 1514    Visit Number 5   Number of Visits 13   Date for SLP Re-Evaluation 05/03/16   SLP Start Time [id number]   SLP Stop Time  1400   SLP Time Calculation (min) 43 min   Activity Tolerance Patient tolerated treatment well      Past Medical History:  Diagnosis Date  . Arthritis    "joints; a little" (01/04/2014)  . Asymptomatic bilateral carotid artery stenosis    per duplex  05-15-2012  left >39%/   right 40-59%  . Basal cell carcinoma    nose  . Borderline diabetes   . BPH (benign prostatic hypertrophy) with urinary obstruction   . Bradycardia   . Bronchial pneumonia 1958  . Chronotropic incompetence with sinus node dysfunction (HCC)   . Compressed cervical disc   . Compression of lumbar vertebra (HCC)    L4 -- L5  . Dizzy   . Dysmetabolic syndrome   . Fatigue   . Frequency of urination   . GERD (gastroesophageal reflux disease)    occasional  . Hemorrhoids   . Hyperlipidemia   . Hypothyroidism   . Nocturia   . NSVT (nonsustained ventricular tachycardia) [facility])    cardiologist-  dr croitoru  . Pacemaker   . Urgency of urination   . Wears glasses     Past Surgical History:  Procedure Laterality Date  . BASAL CELL CARCINOMA EXCISION     nose  . CLOSED REDUCTION NASAL FRACTURE  09-01-2007  . CYSTOSCOPY N/A 12/28/2012   Procedure: CYSTOSCOPY;  Surgeon: Bernestine Amass, MD;  [facility];  Service: Urology;  Laterality: N/A;  . EXERCISE TOLERENCE TEST  12-03-2012  DR CROITURO   CHRONOTROPIC INCOMPETENCE/ NORMAL RESTING BP W/ APPROPRIATE RESPONSE/ NO CHEST PAIN/ NO ST CHANGES  FROM BASELINE  . INSERT / REPLACE / REMOVE PACEMAKER  01/04/2014   [employer] model L121 serial number [id number]  . LACERATION REPAIR Right 1978   middle finger  . NEUROPLASTY / TRANSPOSITION ULNAR NERVE AT ELBOW Left 02-09-2010  . PERMANENT PACEMAKER INSERTION N/A 01/04/2014   Procedure: PERMANENT PACEMAKER INSERTION;  Surgeon: Sanda Klein, MD;  Location: [place] CATH LAB;  Service: Cardiovascular;  Laterality: N/A;  . TONSILLECTOMY AND ADENOIDECTOMY  1944  . TRANSURETHRAL INCISION OF PROSTATE N/A 12/28/2012   Procedure: TRANSURETHRAL INCISION OF THE PROSTATE (TUIP);  Surgeon: Bernestine Amass, MD;  Location: [facility];  Service: Urology;  Laterality: N/A;  . ULNAR NERVE TRANSPOSITION      There were no vitals filed for this visit.      Subjective Assessment - 04/18/16 1503    Subjective "If I need to adjust my volume, I do." re: being heard in a noisy environment   Patient is accompained by: Family member   Currently in Pain? No/denies               ADULT SLP TREATMENT - 04/18/16 0001      General Information   Behavior/Cognition Alert;Cooperative     Treatment Provided   Treatment provided Cognitive-Linquistic     Pain Assessment   Pain Assessment No/denies pain  Cognitive-Linquistic Treatment   Treatment focused on Dysarthria;Voice;Patient/family/caregiver education   Skilled Treatment SLP used loud /a/ to recalibrate patient's loudness in conversation. Used pt's perceived effort level of 8 to improve carryover of vocal techniques to hierarchical tasks at phrase, sentence level with increased cognitive load, as well as during paragraph reading and conversation. Patient required min verbal and visual cues to increase loudness and benefitted from clinician demonstration and modelling. Homework provided. Sustained /a/ averaged 86.3 dB with min A cues. Sentence level: 72.7 dB, Paragraph 72.1 dB avg with min verbal and gestural cues. Spontaneous  responses averaged 62.3 dB; with verbal and gestural cues for increased effort, patient improved to 70 dB in structured conversational tasks.     Assessment / Recommendations / Plan   Plan Continue with current plan of care     Progression Toward Goals   Progression toward goals Progressing toward goals          SLP Education - 04/18/16 1513    Education provided Yes   Education Details Effects of PD on sensory perception of loudness, vocal effort to improve intelligibility, normal conversation levels   Person(s) Educated Patient;Spouse   Methods Explanation;Demonstration;Verbal cues   Comprehension Verbalized understanding;Need further instruction;Returned demonstration          SLP Short Term Goals - 04/18/16 1520      SLP SHORT TERM GOAL #1   Title pt will produce loud /a/ at average 85dB at 50cm over three consecutive therapy sessions   Time 2   Period Weeks   Status Revised     SLP SHORT TERM GOAL #2   Title pt will average 70dB at 50 cm in 18/20 sentence responses over three consecutive sessions   Baseline 04/04/16, 04/18/16   Time 2   Period Weeks   Status Revised          SLP Long Term Goals - 04/18/16 [place] #1   Title pt will maintain average 85 dB loud /a/ at 50 cm with abdominal breathing and proper voicing over 5 sessions   Time 4   Period Weeks   Status Revised     SLP LONG TERM GOAL #2   Title pt will demo conversational volume of average 70dB at 50 cm over 15 minutes mod-complex/complex conversation over three sessions   Time 4   Period Weeks   Status Revised          Plan - 04/18/16 1515    Clinical Impression Statement Patient demonstrates reduced perception of conversational volume and benefits from objective data, clinician and spousal feedback regarding intelligibility. He is very stimulable and improves loudness/intelligibility with min cues to increase perceived vocal effort in conversation. He will benefit from  continued skilled ST in order to improve awareness of conversational volume, as well as to improve generalization and carryover of vocal effort to conversational speech.   Speech Therapy Frequency 1x /week   Treatment/Interventions Compensatory strategies;Patient/family education;Functional tasks;Cueing hierarchy;Internal/external aids;SLP instruction and feedback   Potential Considerations Family/community support;Ability to learn/carryover information;Cooperation/participation level;Previous level of function   SLP Home Exercise Plan Homework assigned   Consulted and Agree with Plan of Care Patient;Family member/caregiver   Family Member Consulted wife [name]      Patient will benefit from skilled therapeutic intervention in order to improve the following deficits and impairments:   Dysarthria and anarthria  Hypokinetic Parkinsonian dysphonia    Problem List Patient Active Problem List   Diagnosis Date  Noted  . Paroxysmal atrial fibrillation ([place]) 02/23/2015  . Chronotropic incompetence with sinus node dysfunction (HCC)   . Pacemaker - [place] serial number [id number] 01/04/2014  . SSS (sick sinus syndrome) ([place]) 12/20/2013  . Chronotropic incompetence with autonomic dysfunction 12/20/2013  . Diabetic peripheral neuropathy ([place]) 06/17/2013  . Transaminasemia 06/17/2013  . Paralysis agitans ([place]) 06/17/2013  . Parkinson's disease ([place]) 06/17/2013  . NSVT (nonsustained ventricular tachycardia) ([place]) 12/18/2012  . Bradycardia 11/26/2012  . Dizziness, nonspecific 11/26/2012  . Cough 01/04/2012  . CARPAL TUNNEL SYNDROME, BILATERAL 01/23/2010  . HEARING LOSS 12/18/2009  . HYPOGLYCEMIA, UNSPECIFIED 10/10/2008  . Seasonal and perennial allergic rhinitis 03/26/2008  . UNS ADVRS EFF UNS RX MEDICINAL&BIOLOGICAL SBSTNC 09/17/2007  . HYPOGONADISM, MALE 04/23/2007  . DEPRESSION, MILD 02/10/2007  . BENIGN PROSTATIC HYPERTROPHY, WITH OBSTRUCTION 02/10/2007  .  OSTEOARTHRITIS 02/10/2007  . LOW BACK PAIN 02/10/2007  . HYPERGLYCEMIA, BORDERLINE 02/10/2007  . Hypothyroidism 10/14/2006  . INTERNAL HEMORRHOIDS 10/14/2006  . Dyslipidemia 09/18/2006   Deneise Lever, [place] CF-SLP Speech-Language Pathologist  Aliene Altes 04/18/2016, 3:23 PM  [facility] [address] [place], [name], [address] Phone: [phone]   Fax:  [phone]   Name: MEREL KARAMAN MRN: [id number] Date of Birth: Mar 31, 1935

## 2016-04-23 ENCOUNTER — Encounter: Payer: Medicare Other | Admitting: Occupational Therapy

## 2016-04-25 ENCOUNTER — Ambulatory Visit: Payer: Medicare Other | Attending: Internal Medicine | Admitting: Speech Pathology

## 2016-04-25 ENCOUNTER — Ambulatory Visit: Payer: Medicare Other | Admitting: Occupational Therapy

## 2016-04-25 DIAGNOSIS — R471 Dysarthria and anarthria: Secondary | ICD-10-CM | POA: Diagnosis not present

## 2016-04-25 DIAGNOSIS — R278 Other lack of coordination: Secondary | ICD-10-CM | POA: Insufficient documentation

## 2016-04-25 DIAGNOSIS — R29898 Other symptoms and signs involving the musculoskeletal system: Secondary | ICD-10-CM | POA: Diagnosis not present

## 2016-04-25 DIAGNOSIS — R29818 Other symptoms and signs involving the nervous system: Secondary | ICD-10-CM | POA: Insufficient documentation

## 2016-04-25 DIAGNOSIS — R2681 Unsteadiness on feet: Secondary | ICD-10-CM | POA: Diagnosis not present

## 2016-04-25 DIAGNOSIS — R293 Abnormal posture: Secondary | ICD-10-CM

## 2016-04-25 DIAGNOSIS — R2689 Other abnormalities of gait and mobility: Secondary | ICD-10-CM | POA: Insufficient documentation

## 2016-04-25 DIAGNOSIS — R490 Dysphonia: Secondary | ICD-10-CM | POA: Diagnosis not present

## 2016-04-25 NOTE — Patient Instructions (Signed)
Paragraph level reading tasks

## 2016-04-25 NOTE — Therapy (Signed)
[facility] [address] [place], [name], [address] Phone: [phone]   Fax:  [phone]  Speech Language Pathology Treatment  Patient Details  Name: ZEPH SISTARE MRN: [id number] Date of Birth: 18-Sep-1935 Referring Provider: Alonza Bogus, D.O.  Encounter Date: 04/25/2016      End of Session - 04/25/16 1410    Visit Number 6   Number of Visits 13   Date for SLP Re-Evaluation 05/03/16   SLP Start Time [id number]   SLP Stop Time  1400   SLP Time Calculation (min) 37 min   Activity Tolerance Patient tolerated treatment well      Past Medical History:  Diagnosis Date  . Arthritis    "joints; a little" (01/04/2014)  . Asymptomatic bilateral carotid artery stenosis    per duplex  05-15-2012  left >39%/   right 40-59%  . Basal cell carcinoma    nose  . Borderline diabetes   . BPH (benign prostatic hypertrophy) with urinary obstruction   . Bradycardia   . Bronchial pneumonia 1958  . Chronotropic incompetence with sinus node dysfunction (HCC)   . Compressed cervical disc   . Compression of lumbar vertebra (HCC)    L4 -- L5  . Dizzy   . Dysmetabolic syndrome   . Fatigue   . Frequency of urination   . GERD (gastroesophageal reflux disease)    occasional  . Hemorrhoids   . Hyperlipidemia   . Hypothyroidism   . Nocturia   . NSVT (nonsustained ventricular tachycardia) [facility])    cardiologist-  dr croitoru  . Pacemaker   . Urgency of urination   . Wears glasses     Past Surgical History:  Procedure Laterality Date  . BASAL CELL CARCINOMA EXCISION     nose  . CLOSED REDUCTION NASAL FRACTURE  09-01-2007  . CYSTOSCOPY N/A 12/28/2012   Procedure: CYSTOSCOPY;  Surgeon: Bernestine Amass, MD;  Location: [facility];  Service: Urology;  Laterality: N/A;  . EXERCISE TOLERENCE TEST  12-03-2012  DR CROITURO   CHRONOTROPIC INCOMPETENCE/ NORMAL RESTING BP W/ APPROPRIATE RESPONSE/ NO CHEST PAIN/ NO ST CHANGES FROM  BASELINE  . INSERT / REPLACE / REMOVE PACEMAKER  01/04/2014   [employer] model L121 serial number [id number]  . LACERATION REPAIR Right 1978   middle finger  . NEUROPLASTY / TRANSPOSITION ULNAR NERVE AT ELBOW Left 02-09-2010  . PERMANENT PACEMAKER INSERTION N/A 01/04/2014   Procedure: PERMANENT PACEMAKER INSERTION;  Surgeon: Sanda Klein, MD;  Location: [facility] CATH LAB;  Service: Cardiovascular;  Laterality: N/A;  . TONSILLECTOMY AND ADENOIDECTOMY  1944  . TRANSURETHRAL INCISION OF PROSTATE N/A 12/28/2012   Procedure: TRANSURETHRAL INCISION OF THE PROSTATE (TUIP);  Surgeon: Bernestine Amass, MD;  Location: [facility];  Service: Urology;  Laterality: N/A;  . ULNAR NERVE TRANSPOSITION      There were no vitals filed for this visit.      Subjective Assessment - 04/25/16 1328    Subjective Has been completing assigned tasks at home   Patient is accompained by: Family member   Currently in Pain? No/denies               ADULT SLP TREATMENT - 04/25/16 0001      General Information   Behavior/Cognition Alert;Cooperative     Treatment Provided   Treatment provided Cognitive-Linquistic     Pain Assessment   Pain Assessment No/denies pain     Cognitive-Linquistic Treatment   Treatment focused  on Dysarthria;Voice;Patient/family/caregiver education   Skilled Treatment SLP used loud /a/ to recalibrate patient's loudness in conversation. Used pt's perceived effort level of 8 to improve carryover of vocal techniques to hierarchical tasks at sentence with increased cognitive load, as well as during paragraph reading and conversation. Patient required min verbal and visual cues to increase loudness and benefitted from clinician demonstration and modelling. Homework provided. Sustained /a/ averaged 89.2 dB with min A cues. Sentence level: 72.9 dB, Paragraph 72.1 dB avg with min verbal and gestural cues. Spontaneous responses averaged 65.7 dB; with verbal and gestural  cues for increased effort, patient improved to 70.6 dB in structured conversational tasks.     Assessment / Recommendations / Plan   Plan Continue with current plan of care     Progression Toward Goals   Progression toward goals Progressing toward goals          SLP Education - 04/25/16 1408    Education provided Yes   Education Details decreased sensory perceptions of movement, loudness impacting vocal amplitude   Person(s) Educated Patient;Spouse   Methods Explanation;Demonstration;Verbal cues   Comprehension Verbalized understanding;Returned demonstration;Verbal cues required          SLP Short Term Goals - 04/25/16 1413      SLP SHORT TERM GOAL #1   Title pt will produce loud /a/ at average 85dB at 50cm over three consecutive therapy sessions   Baseline 04/18/16, 04/25/16   Time 1   Period Weeks   Status On-going     SLP SHORT TERM GOAL #2   Title pt will average 70dB at 50 cm in 18/20 sentence responses over three consecutive sessions   Time 1   Period Weeks   Status Achieved     SLP SHORT TERM GOAL #3   Title pt will demo 10 minutes simple conversation with average 70dB over three sessions   Time 1   Status On-going          SLP Long Term Goals - 04/25/16 1415      SLP LONG TERM GOAL #1   Title pt will maintain average 85 dB loud /a/ at 50 cm with abdominal breathing and proper voicing over 5 sessions   Time 3   Period Weeks   Status On-going     SLP LONG TERM GOAL #2   Title pt will demo conversational volume of average 70dB at 50 cm over 15 minutes mod-complex/complex conversation over three sessions   Time 3   Period Weeks   Status On-going          Plan - 04/25/16 1410    Clinical Impression Statement Patient benefitted from audio feedback to compare spontaneous speech to cued vocal loudness. He is demonstrating improved carryover to conversational speech, and was able to hold a 10 minute spontaneous conversation >70dB immediately following  heirarchy tasks. He will benefit from continued skilled ST in order to improve awareness of conversational volume, generalization and carryover of vocal effort to conversational speech.   Speech Therapy Frequency 1x /week   Treatment/Interventions Compensatory strategies;Patient/family education;Functional tasks;Cueing hierarchy;Internal/external aids;SLP instruction and feedback   Potential to Achieve Goals Good   Potential Considerations Family/community support;Ability to learn/carryover information;Cooperation/participation level;Previous level of function   SLP Home Exercise Plan Homework assigned   Consulted and Agree with Plan of Care Patient;Family member/caregiver   Family Member Consulted wife [name]      Patient will benefit from skilled therapeutic intervention in order to improve the following deficits and impairments:  Dysarthria and anarthria  Hypokinetic Parkinsonian dysphonia  Dysarthria    Problem List Patient Active Problem List   Diagnosis Date Noted  . Paroxysmal atrial fibrillation ([place]) 02/23/2015  . Chronotropic incompetence with sinus node dysfunction (HCC)   . Pacemaker - [place] serial number [id number] 01/04/2014  . SSS (sick sinus syndrome) ([place]) 12/20/2013  . Chronotropic incompetence with autonomic dysfunction 12/20/2013  . Diabetic peripheral neuropathy ([place]) 06/17/2013  . Transaminasemia 06/17/2013  . Paralysis agitans ([place]) 06/17/2013  . Parkinson's disease ([place]) 06/17/2013  . NSVT (nonsustained ventricular tachycardia) ([place]) 12/18/2012  . Bradycardia 11/26/2012  . Dizziness, nonspecific 11/26/2012  . Cough 01/04/2012  . CARPAL TUNNEL SYNDROME, BILATERAL 01/23/2010  . HEARING LOSS 12/18/2009  . HYPOGLYCEMIA, UNSPECIFIED 10/10/2008  . Seasonal and perennial allergic rhinitis 03/26/2008  . UNS ADVRS EFF UNS RX MEDICINAL&BIOLOGICAL SBSTNC 09/17/2007  . HYPOGONADISM, MALE 04/23/2007  . DEPRESSION, MILD 02/10/2007  .  BENIGN PROSTATIC HYPERTROPHY, WITH OBSTRUCTION 02/10/2007  . OSTEOARTHRITIS 02/10/2007  . LOW BACK PAIN 02/10/2007  . HYPERGLYCEMIA, BORDERLINE 02/10/2007  . Hypothyroidism 10/14/2006  . INTERNAL HEMORRHOIDS 10/14/2006  . Dyslipidemia 09/18/2006   Deneise Lever, [place] CF-SLP Speech-Language Pathologist  Aliene Altes 04/25/2016, 2:17 PM  [facility] [address] [place], [name], [address] Phone: [phone]   Fax:  [phone]   Name: TADEN STILE MRN: [id number] Date of Birth: Aug 01, 1935

## 2016-04-25 NOTE — Patient Instructions (Signed)

## 2016-04-25 NOTE — Therapy (Signed)
[facility] [address] [place], [name], [address] Phone: [phone]   Fax:  [phone]  Occupational Therapy Treatment  Patient Details  Name: Stephen Robbins MRN: [id number] Date of Birth: 04/30/1935 Referring Provider: Dr. Wells Guiles [name]   Encounter Date: 04/25/2016      OT End of Session - 04/25/16 1432    Visit Number 6   Number of Visits 13   Date for OT Re-Evaluation 04/18/16   Authorization Type Medicare / BCBS (G-code needed)   Authorization Time Period Renewal completed 3/66/44 (cert. date 04/18/16-06/15/16)   Authorization - Visit Number 6   Authorization - Number of Visits 10   OT Start Time 1400   OT Stop Time 1449   OT Time Calculation (min) 49 min   Activity Tolerance Patient tolerated treatment well   Behavior During Therapy WFL for tasks assessed/performed      Past Medical History:  Diagnosis Date  . Arthritis    "joints; a little" (01/04/2014)  . Asymptomatic bilateral carotid artery stenosis    per duplex  05-15-2012  left >39%/   right 40-59%  . Basal cell carcinoma    nose  . Borderline diabetes   . BPH (benign prostatic hypertrophy) with urinary obstruction   . Bradycardia   . Bronchial pneumonia 1958  . Chronotropic incompetence with sinus node dysfunction (HCC)   . Compressed cervical disc   . Compression of lumbar vertebra (HCC)    L4 -- L5  . Dizzy   . Dysmetabolic syndrome   . Fatigue   . Frequency of urination   . GERD (gastroesophageal reflux disease)    occasional  . Hemorrhoids   . Hyperlipidemia   . Hypothyroidism   . Nocturia   . NSVT (nonsustained ventricular tachycardia) [facility])    cardiologist-  dr croitoru  . Pacemaker   . Urgency of urination   . Wears glasses     Past Surgical History:  Procedure Laterality Date  . BASAL CELL CARCINOMA EXCISION     nose  . CLOSED REDUCTION NASAL FRACTURE  09-01-2007  . CYSTOSCOPY N/A 12/28/2012   Procedure: CYSTOSCOPY;   Surgeon: Bernestine Amass, MD;  Location: [facility];  Service: Urology;  Laterality: N/A;  . EXERCISE TOLERENCE TEST  12-03-2012  DR CROITURO   CHRONOTROPIC INCOMPETENCE/ NORMAL RESTING BP W/ APPROPRIATE RESPONSE/ NO CHEST PAIN/ NO ST CHANGES FROM BASELINE  . INSERT / REPLACE / REMOVE PACEMAKER  01/04/2014   [employer] model L121 serial number [id number]  . LACERATION REPAIR Right 1978   middle finger  . NEUROPLASTY / TRANSPOSITION ULNAR NERVE AT ELBOW Left 02-09-2010  . PERMANENT PACEMAKER INSERTION N/A 01/04/2014   Procedure: PERMANENT PACEMAKER INSERTION;  Surgeon: Sanda Klein, MD;  Location: [facility];  Service: Cardiovascular;  Laterality: N/A;  . TONSILLECTOMY AND ADENOIDECTOMY  1944  . TRANSURETHRAL INCISION OF PROSTATE N/A 12/28/2012   Procedure: TRANSURETHRAL INCISION OF THE PROSTATE (TUIP);  Surgeon: Bernestine Amass, MD;  Location: [facility];  Service: Urology;  Laterality: N/A;  . ULNAR NERVE TRANSPOSITION      There were no vitals filed for this visit.      Subjective Assessment - 04/25/16 1408    Subjective  Pt reports exercises going well at home   Patient is accompained by: Family member  wife   Pertinent History pacemaker, L ulnar nerve transposition with L 5th decr ext, ?CTR, osteoporosis, hx of low back pain, mild depression,  DM, hypothyroidism, hypogonadism.    Limitations PACEMAKER   Patient Stated Goals slow progression   Currently in Pain? No/denies        Functional step (forward/back) with reaching to grasp cylinder objects with emphasis on PWR! Hand and large stepping/wt. Shift with trunk rotation.  Pt with incr difficulty stepping with RLE (and wt. Shift to the L) and with PWR! Hand with LUE.                             OT Short Term Goals - 04/18/16 1857      OT SHORT TERM GOAL #1   Title Pt will be independent with PD-specific HEP.--check STGs 05/16/16   Time 4   Period Weeks    Status On-going  04/18/16:  not fully met, min cues     OT SHORT TERM GOAL #2   Title Pt will verbalize understanding of ways to prevent future complications related to PD.   Time 4   Period Weeks   Status Achieved  04/18/16:  met     OT SHORT TERM GOAL #3   Title Pt will improve PPT #4 (donning/doffing jacket) by at least 3sec with use of large amplitude movement strategies to prevent future complications.   Time 4   Period Weeks   Status New           OT Long Term Goals - 04/18/16 1858      OT LONG TERM GOAL #1   Title Pt will verbalize understanding of strategies to incr ease/safety of ADLs/IADLs and to prevent future complications related to PD.--check LTGs 05/30/16    Time 6   Period Weeks   Status On-going  04/18/16:  continues to need min cues due to decr awareness     OT LONG TERM GOAL #2   Title Pt will improve coordination for ADLs as shown by completing 9-hole peg test in 29sec or less with dominant LUE.   Baseline 32.65sec   Time 6   Period Weeks   Status On-going     OT LONG TERM GOAL #3   Title Pt will improve bilateral functional reaching for ADLs as shown by improving score on box and blocks test  by at least 5 bilaterally.   Baseline R-39 blocks, L-42 blocks   Time 6   Period Weeks   Status On-going     OT LONG TERM GOAL #4   Title Pt will improve efficiency with dressing as shown by buttoning/unbuttoning 3 buttons in 24sec or less.   Baseline 27.06sec   Time 6   Period Weeks   Status On-going               Plan - 04/25/16 1411    Clinical Impression Statement Pt progressing towards goals, but continues to need cueing at times for incr awarenss of movement amplitude and for larger amplitude movements for functional tasks/exercise.   Rehab Potential Good   OT Frequency --  8 visit over 6 weeks   OT Duration 6 weeks   OT Treatment/Interventions Self-care/ADL training;Therapeutic exercise;Parrafin;DME and/or AE instruction;Wellsite geologist;Therapeutic activities;Patient/family education;Cognitive remediation/compensation;Manual Therapy;Splinting;Neuromuscular education;Fluidtherapy;Cryotherapy;Moist Heat;Energy conservation;Passive range of motion;Therapeutic exercises   Plan writing, continue with large amplitude movements, functional reaching, handwriting   OT Home Exercise Plan Education Provided:  coordination HEP, Using large amplitude movements for ADLs, PWR! hands   Consulted and Agree with Plan of Care Patient;Family member/caregiver   Family Member Consulted wife  Patient will benefit from skilled therapeutic intervention in order to improve the following deficits and impairments:  Decreased coordination, Decreased knowledge of use of DME, Impaired UE functional use, Improper spinal/pelvic alignment, Impaired tone, Impaired perceived functional ability, Decreased balance, Impaired sensation  Visit Diagnosis: Other symptoms and signs involving the nervous system  Posture abnormality  Other symptoms and signs involving the musculoskeletal system  Other lack of coordination  Unsteadiness on feet  Other abnormalities of gait and mobility    Problem List Patient Active Problem List   Diagnosis Date Noted  . Paroxysmal atrial fibrillation ([place]) 02/23/2015  . Chronotropic incompetence with sinus node dysfunction (HCC)   . Pacemaker - [employer] serial number [id number] 01/04/2014  . SSS (sick sinus syndrome) ([place]) 12/20/2013  . Chronotropic incompetence with autonomic dysfunction 12/20/2013  . Diabetic peripheral neuropathy ([place]) 06/17/2013  . Transaminasemia 06/17/2013  . Paralysis agitans ([place]) 06/17/2013  . Parkinson's disease ([place]) 06/17/2013  . NSVT (nonsustained ventricular tachycardia) ([place]) 12/18/2012  . Bradycardia 11/26/2012  . Dizziness, nonspecific 11/26/2012  . Cough 01/04/2012  . CARPAL TUNNEL SYNDROME, BILATERAL 01/23/2010  . HEARING LOSS 12/18/2009  .  HYPOGLYCEMIA, UNSPECIFIED 10/10/2008  . Seasonal and perennial allergic rhinitis 03/26/2008  . UNS ADVRS EFF UNS RX MEDICINAL&BIOLOGICAL SBSTNC 09/17/2007  . HYPOGONADISM, MALE 04/23/2007  . DEPRESSION, MILD 02/10/2007  . BENIGN PROSTATIC HYPERTROPHY, WITH OBSTRUCTION 02/10/2007  . OSTEOARTHRITIS 02/10/2007  . LOW BACK PAIN 02/10/2007  . HYPERGLYCEMIA, BORDERLINE 02/10/2007  . Hypothyroidism 10/14/2006  . INTERNAL HEMORRHOIDS 10/14/2006  . Dyslipidemia 09/18/2006    [facility] 04/25/2016, 2:57 PM  [facility] [address] [place], [name], [address] Phone: [phone]   Fax:  [phone]  Name: Stephen Robbins MRN: [id number] Date of Birth: 1936-01-19   Vianne Bulls, OTR/L [facility] [address] [place], [place]  [address] [phone] phone [phone] 04/25/16 2:57 PM

## 2016-04-25 NOTE — Progress Notes (Signed)
Stephen Robbins was seen today in f/u in the movement clinic.  He was dx with PD last visit, on 06/17/13.  He is on carbidopa/levodopa 25/100 tid.  He has no side effects with the carbidopa/levodopa, but states that "I just don't know what to expect."  He continues to have tremor, which can be bothersome for him.  He has had no falls.  No lightheadedness.  No near syncope.  No hallucinations.  He had a carotid ultrasound done since last visit.  There is 1-39% stenosis bilaterally.  He did have some lab work done since last visit.  His hemoglobin A1c has greatly improved.  It is 6.3 (was 7.9 and prior to that was 9.4). He just finished PT and is enrolled in the exercise class now.  He presents today with a long list of questions.  He again tape records our session.    11/19/13 update:  Pt is f/u regarding Parkinson's disease.  He is currently on carbidopa/levodopa 25/100, one tablet 3 times per day. He is accompanied by his wife, who supplements the history.   Patient denies any falls.  He denies hallucinations.  Denies nausea or vomiting.  No lightheadedness or near syncope.  He is exercising.  He still has tremor and isn't sure that the levodopa helps tremor.  He sent me several e-mails since last visit.  One of these was just a TV news link regarding [facility] high-resolution imaging and deep brain stimulation.  He subsequently sent another e-mail regarding focused ultrasound.  Asks about updates regarding these and has 2 pages of questions.  Asks about why we can't give him human growth hormone or steroids for PD.  Overall, patient is clinically doing well.   02/21/14 update:   Pt returns for f/u, accompanied by his wife who supplements the history.  The records that were made available to me were reviewed.  Pt has had PPM since last visit due to bradycardia.  He has been told not to exercise until 12/9.  He will start with PWR moves classes after the first of the year as well as circuit 2 classes.  He is still  on carbidopa/levodopa 25/100 at 8-9am/12-1pm/6-7 pm.  He is c/o paresthesias/"neuropathy" in the R foot.  He only notices it with driving.   He has no back pain.  He has no sensation of balance loss with closing eyes in the shower.  He thinks that exposure to levodopa causes the neuropathy and brings with him a small study (58 pts) that suggests that levodopa could be an etiology for the neuropathy.  Notices lack of smell.  No falls.  Rare lightheadedness. No hallucinations.    05/23/14 update:  Pt is f/u today, accompanied by his wife who supplements the history.  [facility] since [facility] visit and I reviewed those records.  He was told he had PN (known diagnosis) but no new meds were prescribed.  He asks about a "cure" for PN that a doctor in [place] was advertising.   He remains on carbidopa/levodopa 25/100 tid for the PD.  He is exercising faithfully.  He has trouble getting himself over 3 miles per hour when walking and he is concerned about that.  He asks again about taking steroids and human growth hormone.  He also asks about taking protein supplements.  He has d/c the glucosamine for joint pain/arthritis as he didn't think that it was helpful.    09/08/14 update:  The patient  is following up today.  He is accompanied by his wife who supplements the history.   Records were reviewed since last visit, from his primary care physician, podiatrist, as well as his cardiologist.  He remains on carbidopa/levodopa 25/100, one tablet 3 times per day.  He notices no wearing off.  He is doing PD circuit class.  He called his podiatrist since last visit and asked him about medications for neuropathy and he deferred to me.  We started gabapentin on May 30, 2014.  He is on 300 mg twice a day.  He states that his feet numbness is better.  He has continued to complain about fatigue.  His pacemaker was recently adjusted.  He is trying to walk/exercise at the park and his endurance has been better ever  since.  He again asks about energy.  He is back on testosterone for that.    01/12/15 update:  The patient returns today for follow-up, accompanied by his wife who supplements the history.  He remains on carbidopa/levodopa 25/100, one tablet 3 times per day.  Overall, the patient states that he is doing well.   Still has some intermittent tremor.  He denies any syncope.  He has had no falls since last visit.  He has had some near falls.  No hallucinations or visual distortions.  He is on gabapentin, 300 mg twice a day for peripheral neuropathy secondary to diabetes mellitus. Pt states that it is helping with the sx's, along with the "natural herb that I take."   He continues to faithfully exercise with the PWR classes.  Some lightheaded after exercise.  No new medical issues since last visit.    04/18/15 update:  The patient presents today, accompanied by his wife who supplements the history.  Last visit, increased the patient's carbidopa/levodopa 25/100, so that he is now taking 1-1/2 tablets 3 times per day.   When asked how he feel, he states, "I don't know how I am supposed to feel."  He denies stiffness.  He has minimal thumb tremor.   He is supposed to be on gabapentin, 300 mg twice a day for peripheral neuropathy but went down to q day because his feet weren't bothering him.  I have reviewed records made available to me since last visit.  He was diagnosed with atrial fibrillation and started on Xarelto.  He is doing well with that.  He denies any bleeding difficulties.  No falls.  No near syncope.  Rarely does he even feel unstable.  He is exercising.  Goes to the gym three times a week.    08/22/15 update:  The patient presents today, accompanied by his wife who supplements the history. He is on carbidopa/levodopa 25/100, 1-1/2 tablets 3 times per day.  He remains on gabapentin, 300 mg once a day for peripheral neuropathy (7-9pm). He states that he doesn't think that he can come off of it, at least until  he moves.  He did come to the PD support group lecture that I gave 2 weeks ago.  He denies hallucinations, lightheadedness, near syncope.  He does state that they are moving to [place] in assisted living.  States that there has been some stress with moving, as he is having to fix a hole that was discovered in the bathroom floor.   Asks me about alpha-lipoic acid and states that the people who sell it to him "think that it is good."    12/26/15 update:  The patient follows up today, accompanied  by his wife who supplements the history.  He remains on carbidopa/levodopa 25/100, 1-1/2 tablets 3 times per day.  He is still on gabapentin, 300 mg at night for neuropathy.  States that he doesn't want to try and d/c that.    He has since moved to [place] and they are adjusting well.  They have sold their house.  He is doing a [employer] class.  No falls.  Some near falls.    04/29/16 update:  Patient follows up today, accompanied by his wife who supplements the history.  Patient is on carbidopa/levodopa 25/100, 1.5 tablets 3 times per day (8am/noon/5pm).  Pt denies falls.  Pt denies lightheadedness, near syncope.  No hallucinations.  Mood has been "up and down."  Occasional bouts of constipation vs diarrhea.   On gabapentin 300 q hs for PN.  Did OT/ST since our last visit and reviewed those notes.  Doing PWR moves classes on wednesdays.    No neuroimaging since CT brain in 2009.  PREVIOUS MEDICATIONS: none to date  ALLERGIES:   Allergies  Allergen Reactions  . Penicillins Other (See Comments)    Unknown childhood reaction    CURRENT MEDICATIONS:  Current Outpatient Prescriptions on File Prior to Visit  Medication Sig Dispense Refill  . ALPRAZolam (XANAX) 0.25 MG tablet Take 1 tablet (0.25 mg total) by mouth 2 (two) times daily as needed. (Patient taking differently: Take 0.25 mg by mouth daily. ) 30 tablet 2  . B Complex Vitamins (B COMPLEX 50) TABS Take 1 tablet by mouth every morning.     .  canagliflozin (INVOKANA) 300 MG TABS tablet Take 1 tablet (300 mg total) by mouth daily before breakfast. 30 tablet 6  . carbidopa-levodopa (SINEMET IR) 25-100 MG tablet Take 1.5 tablets by mouth 3 (three) times daily before meals. 405 tablet 1  . dapagliflozin propanediol (FARXIGA) 10 MG TABS tablet Take 10 mg by mouth daily. 90 tablet 1  . fish oil-omega-3 fatty acids 1000 MG capsule Take 2 g by mouth daily.    Marland Kitchen gabapentin (NEURONTIN) 300 MG capsule Take 1 capsule (300 mg total) by mouth at bedtime. 90 capsule 1  . hydrocortisone 0.5 % cream Apply 1 application topically 2 (two) times daily as needed. rash    . ibuprofen (ADVIL,MOTRIN) 200 MG tablet Take 200 mg by mouth daily as needed for mild pain.     Marland Kitchen levothyroxine (SYNTHROID, LEVOTHROID) 50 MCG tablet take 1 tablet by mouth once daily BEFORE BREAKFAST 90 tablet 3  . mirabegron ER (MYRBETRIQ) 25 MG TB24 tablet Take 25 mg by mouth daily.    Vladimir Faster Glycol-Propyl Glycol (SYSTANE ULTRA) 0.4-0.3 % SOLN Place 1 drop into both eyes daily as needed (dry eyes).     . sertraline (ZOLOFT) 25 MG tablet Take 25 mg by mouth every morning. Reported on 06/02/2015    . simvastatin (ZOCOR) 20 MG tablet take 1 tablet by mouth at bedtime 90 tablet 3  . tamsulosin (FLOMAX) 0.4 MG CAPS capsule take 1 capsule by mouth every morning 90 capsule 1  . vitamin B-12 (CYANOCOBALAMIN) 1000 MCG tablet Take 1,000 mcg by mouth every morning.     Alveda Reasons 20 MG TABS tablet take 1 tablet by mouth once daily WITH SUPPER 90 tablet 1   No current facility-administered medications on file prior to visit.     PAST MEDICAL HISTORY:   Past Medical History:  Diagnosis Date  . Arthritis    "joints; a little" (01/04/2014)  .  Asymptomatic bilateral carotid artery stenosis    per duplex  05-15-2012  left >39%/   right 40-59%  . Basal cell carcinoma    nose  . Borderline diabetes   . BPH (benign prostatic hypertrophy) with urinary obstruction   . Bradycardia   .  Bronchial pneumonia 1958  . Chronotropic incompetence with sinus node dysfunction (HCC)   . Compressed cervical disc   . Compression of lumbar vertebra (HCC)    L4 -- L5  . Dizzy   . Dysmetabolic syndrome   . Fatigue   . Frequency of urination   . GERD (gastroesophageal reflux disease)    occasional  . Hemorrhoids   . Hyperlipidemia   . Hypothyroidism   . Nocturia   . NSVT (nonsustained ventricular tachycardia) [facility])    cardiologist-  dr croitoru  . Pacemaker   . Urgency of urination   . Wears glasses     PAST SURGICAL HISTORY:   Past Surgical History:  Procedure Laterality Date  . BASAL CELL CARCINOMA EXCISION     nose  . CLOSED REDUCTION NASAL FRACTURE  09-01-2007  . CYSTOSCOPY N/A 12/28/2012   Procedure: CYSTOSCOPY;  Surgeon: Bernestine Amass, MD;  Location: [facility];  Service: Urology;  Laterality: N/A;  . EXERCISE TOLERENCE TEST  12-03-2012  DR CROITURO   CHRONOTROPIC INCOMPETENCE/ NORMAL RESTING BP W/ APPROPRIATE RESPONSE/ NO CHEST PAIN/ NO ST CHANGES FROM BASELINE  . INSERT / REPLACE / REMOVE PACEMAKER  01/04/2014   [employer] model L121 serial number [id number]  . LACERATION REPAIR Right 1978   middle finger  . NEUROPLASTY / TRANSPOSITION ULNAR NERVE AT ELBOW Left 02-09-2010  . PERMANENT PACEMAKER INSERTION N/A 01/04/2014   Procedure: PERMANENT PACEMAKER INSERTION;  Surgeon: Sanda Klein, MD;  Location: [facility];  Service: Cardiovascular;  Laterality: N/A;  . TONSILLECTOMY AND ADENOIDECTOMY  1944  . TRANSURETHRAL INCISION OF PROSTATE N/A 12/28/2012   Procedure: TRANSURETHRAL INCISION OF THE PROSTATE (TUIP);  Surgeon: Bernestine Amass, MD;  Location[facility];  Service: Urology;  Laterality: N/A;  . ULNAR NERVE TRANSPOSITION      SOCIAL HISTORY:   Social History   Social History  . Marital status: Married    Spouse name: N/A  . Number of children: 0  . Years of education: N/A   Occupational History  . retired  Retired    Programme researcher, broadcasting/film/video    Social History Main Topics  . Smoking status: Former Smoker    Packs/day: 1.00    Years: 10.00    Types: Cigarettes    Quit date: 03/25/1968  . Smokeless tobacco: Never Used  . Alcohol use 4.2 oz/week    7 Glasses of wine per week     Comment: 1 glass wine daily  . Drug use: No  . Sexual activity: Not Currently   Other Topics Concern  . Not on file   Social History Narrative   Regular exercise          FAMILY HISTORY:   Family Status  Relation Status  . Father Deceased   stroke  . Mother Deceased   lung cancer  . Brother Alive   healthy    ROS:    A complete 10 system review of systems was obtained and was unremarkable apart from what is mentioned above.  PHYSICAL EXAMINATION:    VITALS:   Vitals:   04/29/16 1401  BP: 90/60  Pulse: 73  Weight: 175 lb (79.4 kg)  Height:  5' 9" (1.753 m)    GEN:  The patient appears stated age and is in NAD. HEENT:  [place]/AT.  MMM CV:  RRR (no afib today) Lungs:  CTAB Neck:  No bruits   Neurological examination:  Orientation: The patient is alert and oriented x3. Fund of knowledge is appropriate.  Recent and remote memory are intact.  Cranial nerves: There is good facial symmetry. Mild facial hypomimia.  Pupils are equal round and reactive to light bilaterally. Fundoscopic exam reveals clear margins bilaterally. Extraocular muscles are intact. The visual fields are full to confrontational testing. The speech is fluent and clear.  Soft palate rises symmetrically and there is no tongue deviation. Hearing is intact to conversational tone. Motor: Strength is 5/5 in the bilateral upper and lower extremities.   Shoulder shrug is equal and symmetric.  There is no pronator drift. Sensation:  Intact to light touch.  More sensitive aspects not tested today  Movement examination: Tone: There is very mild rigidity in the UE (he is due for medication) Abnormal movements: There is intermittent LUE resting  tremor Coordination:  There are decreased RAM's only in the feet and present with toe taps bilaterally Gait and Station: The patient has no difficulty arising out of a deep-seated chair without the use of the hands. The patient's stride length is fairly normal with just slight decreased arm swing on the L.  Negative pull test.      LABS:  Lab Results  Component Value Date   TSH 4.49 01/31/2016   Lab Results  Component Value Date   VITAMINB12 872 06/17/2013     Chemistry      Component Value Date/Time   NA 137 01/31/2016 0914   K 4.2 01/31/2016 0914   CL 103 01/31/2016 0914   CO2 27 01/31/2016 0914   BUN 17 01/31/2016 0914   CREATININE 0.85 01/31/2016 0914   CREATININE 0.77 12/29/2013 1627      Component Value Date/Time   CALCIUM 9.7 01/31/2016 0914   ALKPHOS 41 01/31/2016 0914   AST 26 01/31/2016 0914   ALT 8 01/31/2016 0914   BILITOT 0.8 01/31/2016 0914     RPR negative Lab Results  Component Value Date   FOLATE >20.0 06/17/2013     Lab Results  Component Value Date   HGBA1C 7.0 (H) 01/31/2016    ASSESSMENT/PLAN:  1. idiopathic Parkinson's disease.  I see no atypical features.    -He will continue the carbidopa/levodopa 25/100, 1.5 tablets 3 times daily.    -brought a long list of questions again today and answered them to the best of my ability.  -met with our social worker and discussed in detail all of the community resources  -encouraged him to come to new support group for pts/caregivers 2.  Peripheral neuropathy  -There is evidence of this on examination.  Likely from DM.  His workup for reversible causes was negative.  His diabetes is under much better control than in the past.  -improved on gabapentin 300 mg and only taking qhs now. Doesn't want to wean. 3.  Hx of transaminasemia  -These were elevated in 2012 but have improved 4.  R carotid bruit with hx of carotid stenosis  -04/2012 u/s with 40-50% stenosis on the R.  this was rechecked in 2015 and  was 1-39% bilaterally. 5. Follow up is anticipated in the next few months, sooner should new neurologic issues arise.  Much greater than 50% of this visit was spent in counseling and coordinating  care.  Total face to face time:  25 min

## 2016-04-29 ENCOUNTER — Ambulatory Visit (INDEPENDENT_AMBULATORY_CARE_PROVIDER_SITE_OTHER): Payer: Medicare Other | Admitting: Neurology

## 2016-04-29 ENCOUNTER — Encounter: Payer: Self-pay | Admitting: Neurology

## 2016-04-29 VITALS — BP 90/60 | HR 73 | Ht 69.0 in | Wt 175.0 lb

## 2016-04-29 DIAGNOSIS — G609 Hereditary and idiopathic neuropathy, unspecified: Secondary | ICD-10-CM | POA: Diagnosis not present

## 2016-04-29 DIAGNOSIS — G20 Parkinson's disease: Secondary | ICD-10-CM | POA: Diagnosis not present

## 2016-04-29 NOTE — Progress Notes (Signed)
Clinical Social Work Note  CSW met with pt and pt wife at the request of Dr. Carles Collet at today's follow up visit. CSW introduced self and informed role of social work including connection to [employer], short-term coping/problem-solving counseling, and involvement in facilitation in [employer] support group. Pt expressed interest in having detailed information about community resources. CSW provided pt with packet of information and discussed in detail community resources. CSW discussed PD specific exercise programs that are offered. Pt reports that he has been participating in [place]oves classes for two years. Pt states that pt and pt wife recently moved to [place] and CSW discussed PD cycling class at [facility]. CSW provided list that detailed exercise resources and provided brochures for the different exercise programs. CSW discussed reputable online resources if pt is interested in Public relations account executive. CSW provided pt with "Managing Parkinson's Mid-Stride" booklet and discussed wall of additional resources pt may be interested in. Pt reports that he has utilized wall of resources in the past. CSW provided information on [employer] and discussed their events to support individuals with Parkinson's disease. CSW encouraged attendance to [employer] [place] and pt and pt wife report that they have attended before. CSW discussed changes in [employer] Group format this year and encouraged pt and pt wife to come to group in February. CSW provided CSW contact information and pt and pt wife have agreed to contact me if I can be of further assistance with any social work needs that arise before next office visit.  Alison Murray, MSW, LCSW Clinical Social Worker Movement [facility] [phone]

## 2016-04-29 NOTE — Patient Instructions (Signed)
You are doing great!  Continue your exercises.    I will have you meet our social worker to give you our new patient resources and packets  Consider attending the support group this month, where we will be debuting our new support group where we will be talking about relationships.

## 2016-04-30 ENCOUNTER — Encounter: Payer: Medicare Other | Admitting: Speech Pathology

## 2016-04-30 ENCOUNTER — Encounter: Payer: Medicare Other | Admitting: Occupational Therapy

## 2016-05-02 ENCOUNTER — Ambulatory Visit: Payer: Medicare Other | Admitting: Occupational Therapy

## 2016-05-02 ENCOUNTER — Ambulatory Visit: Payer: Medicare Other | Admitting: Speech Pathology

## 2016-05-02 DIAGNOSIS — R2681 Unsteadiness on feet: Secondary | ICD-10-CM

## 2016-05-02 DIAGNOSIS — R29898 Other symptoms and signs involving the musculoskeletal system: Secondary | ICD-10-CM

## 2016-05-02 DIAGNOSIS — R471 Dysarthria and anarthria: Secondary | ICD-10-CM | POA: Diagnosis not present

## 2016-05-02 DIAGNOSIS — R490 Dysphonia: Secondary | ICD-10-CM

## 2016-05-02 DIAGNOSIS — R293 Abnormal posture: Secondary | ICD-10-CM

## 2016-05-02 DIAGNOSIS — G20 Parkinson's disease: Secondary | ICD-10-CM

## 2016-05-02 DIAGNOSIS — R29818 Other symptoms and signs involving the nervous system: Secondary | ICD-10-CM | POA: Diagnosis not present

## 2016-05-02 DIAGNOSIS — R278 Other lack of coordination: Secondary | ICD-10-CM | POA: Diagnosis not present

## 2016-05-02 DIAGNOSIS — R2689 Other abnormalities of gait and mobility: Secondary | ICD-10-CM

## 2016-05-02 NOTE — Therapy (Signed)
[facility] [address] [place], [name], [address] Phone: [phone]   Fax:  [phone]  Speech Language Pathology Treatment  Patient Details  Name: Stephen Robbins MRN: [id number] Date of Birth: Feb 24, 1936 Referring Provider: Alonza Bogus, D.O.  Encounter Date: 05/02/2016      End of Session - 05/02/16 1413    Visit Number 7   Number of Visits 13   Date for SLP Re-Evaluation 06/28/16   Authorization Type Medicare / BCBS (G-code needed)   Authorization Time Period Renewal completed 08/27/38 (cert. date 05/02/16-06/28/16)   Authorization - Visit Number 7   Authorization - Number of Visits 8   SLP Start Time 3474   SLP Stop Time  2595   SLP Time Calculation (min) 43 min   Activity Tolerance Patient tolerated treatment well      Past Medical History:  Diagnosis Date  . Arthritis    "joints; a little" (01/04/2014)  . Asymptomatic bilateral carotid artery stenosis    per duplex  05-15-2012  left >39%/   right 40-59%  . Basal cell carcinoma    nose  . Borderline diabetes   . BPH (benign prostatic hypertrophy) with urinary obstruction   . Bradycardia   . Bronchial pneumonia 1958  . Chronotropic incompetence with sinus node dysfunction (HCC)   . Compressed cervical disc   . Compression of lumbar vertebra (HCC)    L4 -- L5  . Dizzy   . Dysmetabolic syndrome   . Fatigue   . Frequency of urination   . GERD (gastroesophageal reflux disease)    occasional  . Hemorrhoids   . Hyperlipidemia   . Hypothyroidism   . Nocturia   . NSVT (nonsustained ventricular tachycardia) [facility])    cardiologist-  dr croitoru  . Pacemaker   . Urgency of urination   . Wears glasses     Past Surgical History:  Procedure Laterality Date  . BASAL CELL CARCINOMA EXCISION     nose  . CLOSED REDUCTION NASAL FRACTURE  09-01-2007  . CYSTOSCOPY N/A 12/28/2012   Procedure: CYSTOSCOPY;  Surgeon: Bernestine Amass, MD;  Location: [facility];  Service: Urology;  Laterality: N/A;  . EXERCISE TOLERENCE TEST  12-03-2012  DR CROITURO   CHRONOTROPIC INCOMPETENCE/ NORMAL RESTING BP W/ APPROPRIATE RESPONSE/ NO CHEST PAIN/ NO ST CHANGES FROM BASELINE  . INSERT / REPLACE / REMOVE PACEMAKER  01/04/2014   [employer] model L121 serial number [id number]  . LACERATION REPAIR Right 1978   middle finger  . NEUROPLASTY / TRANSPOSITION ULNAR NERVE AT ELBOW Left 02-09-2010  . PERMANENT PACEMAKER INSERTION N/A 01/04/2014   Procedure: PERMANENT PACEMAKER INSERTION;  Surgeon: Sanda Klein, MD;  Location: [facility];  Service: Cardiovascular;  Laterality: N/A;  . TONSILLECTOMY AND ADENOIDECTOMY  1944  . TRANSURETHRAL INCISION OF PROSTATE N/A 12/28/2012   Procedure: TRANSURETHRAL INCISION OF THE PROSTATE (TUIP);  Surgeon: Bernestine Amass, MD;  Location: [facility];  Service: Urology;  Laterality: N/A;  . ULNAR NERVE TRANSPOSITION      There were no vitals filed for this visit.      Subjective Assessment - 05/02/16 1348    Subjective "I haven't been waking anybody up." re: home exercises   Patient is accompained by: Family member   Currently in Pain? No/denies               ADULT SLP TREATMENT - 05/02/16 0001      General Information  Behavior/Cognition Alert;Cooperative     Treatment Provided   Treatment provided Cognitive-Linquistic     Pain Assessment   Pain Assessment No/denies pain     Cognitive-Linquistic Treatment   Treatment focused on Dysarthria;Voice;Patient/family/caregiver education   Skilled Treatment Reassessed pt's vocal amplitude across hierarchy tasks including sustained /a/ phonation: 85.2 dB, sentence (72.5dB) and paragraph (73.5 dB) reading tasks, as well as spontaneous responses (69dB) and structured conversation (72.6 dB with min A cues). Discussed treatment plan and goals of care with patient, who has been making steady progress toward goals and is beginning to  show carryover of vocal techniques to conversation following SLP instruction and min A. Provided education regarding physical and sensory effects of Parkinson's disease as they impact communication and intelligibility.      Assessment / Recommendations / Plan   Plan Continue with current plan of care     Progression Toward Goals   Progression toward goals Progressing toward goals          SLP Education - 05/02/16 1412    Education Details Impacts of PD on speech intelligibility, potential impacts on swallowing function   Person(s) Educated Patient;Spouse   Methods Explanation;Demonstration;Verbal cues   Comprehension Verbalized understanding          SLP Short Term Goals - 05/02/16 1424      SLP SHORT TERM GOAL #1   Title pt will produce loud /a/ at average 85dB at 50cm over three consecutive therapy sessions   Baseline 04/18/16, 04/25/16, 05/02/16   Time 0   Period Weeks   Status Achieved     SLP SHORT TERM GOAL #2   Title pt will average 70dB at 50 cm in 18/20 sentence responses over three consecutive sessions with rare verbal cues   Baseline 04/04/16, 04/18/16, 05/02/16   Time 4   Period Weeks   Status Revised     SLP SHORT TERM GOAL #3   Title pt will demo 10 minutes simple conversation with average 70dB over three sessions   Baseline 04/18/16, 04/25/16, 05/02/16   Time 0   Period Weeks   Status Achieved     SLP SHORT TERM GOAL #4   Title pt will demo conversational volume of average 70dB at 50 cm over 15 minutes mod-complex/complex conversation over three sessions   Time 4   Period Weeks   Status New          SLP Long Term Goals - 05/02/16 1429      SLP LONG TERM GOAL #1   Title pt will maintain average 85 dB loud /a/ at 50 cm with abdominal breathing and proper voicing over 5 sessions   Baseline 04/18/16, 04/25/16, 05/02/16   Time 8   Period Weeks   Status Revised     SLP LONG TERM GOAL #2   Title Pt will converse audibly in noisy environment/outside of therapy  room over 15 minutes with supervision cues.   Time 8   Period Weeks   Status Revised          Plan - 05/02/16 1423    Clinical Impression Statement Patient has been making excellent progress toward goals. Frequency has been reduced to 1 visit per week due to patient's scheduling request, therefore patient requires additional time to reach targets. Patient has been demonstrating reduced need for cuing for breath support and vocal loudness at sentence and paragraph levels, and is showing emerging progress in carryover to conversation during structured therapy tasks. He will benefit from continued skilled  ST to increase awareness of conversational volume and generalization/carryover of vocal effort to conversational speech for improved intelligibility at home and in the community. Based on patient's current rate of progress, anticipate all goals will be met in 4-8 weeks.    Speech Therapy Frequency 1x /week   Duration Other (comment)  8 weeks or a total of 8 visits   Treatment/Interventions Compensatory strategies;Patient/family education;Functional tasks;Cueing hierarchy;Internal/external aids;SLP instruction and feedback   Potential to Achieve Goals Good   Potential Considerations Family/community support;Ability to learn/carryover information;Cooperation/participation level;Previous level of function   SLP Home Exercise Plan Homework assigned   Consulted and Agree with Plan of Care Patient;Family member/caregiver   Family Member Consulted wife [name]      Patient will benefit from skilled therapeutic intervention in order to improve the following deficits and impairments:   Hypokinetic Parkinsonian dysphonia  Dysarthria and anarthria      G-Codes - May 10, 2016 1401    Functional Assessment Tool Used NOMS - 5    Functional Limitations Motor speech   Motor Speech Current Status [id number]) At least 20 percent but less than 40 percent impaired, limited or restricted   Motor Speech Goal Status  (X3818) At least 1 percent but less than 20 percent impaired, limited or restricted      Problem List Patient Active Problem List   Diagnosis Date Noted  . Paroxysmal atrial fibrillation ([place]) 02/23/2015  . Chronotropic incompetence with sinus node dysfunction (HCC)   . Pacemaker - [place] serial number [id number] 01/04/2014  . SSS (sick sinus syndrome) ([place]) 12/20/2013  . Chronotropic incompetence with autonomic dysfunction 12/20/2013  . Diabetic peripheral neuropathy ([place]) 06/17/2013  . Transaminasemia 06/17/2013  . Paralysis agitans ([place]) 06/17/2013  . Parkinson's disease ([place]) 06/17/2013  . NSVT (nonsustained ventricular tachycardia) ([place]) 12/18/2012  . Bradycardia 11/26/2012  . Dizziness, nonspecific 11/26/2012  . Cough 01/04/2012  . CARPAL TUNNEL SYNDROME, BILATERAL 01/23/2010  . HEARING LOSS 12/18/2009  . HYPOGLYCEMIA, UNSPECIFIED 10/10/2008  . Seasonal and perennial allergic rhinitis 03/26/2008  . UNS ADVRS EFF UNS RX MEDICINAL&BIOLOGICAL SBSTNC 09/17/2007  . HYPOGONADISM, MALE 04/23/2007  . DEPRESSION, MILD 02/10/2007  . BENIGN PROSTATIC HYPERTROPHY, WITH OBSTRUCTION 02/10/2007  . OSTEOARTHRITIS 02/10/2007  . LOW BACK PAIN 02/10/2007  . HYPERGLYCEMIA, BORDERLINE 02/10/2007  . Hypothyroidism 10/14/2006  . INTERNAL HEMORRHOIDS 10/14/2006  . Dyslipidemia 09/18/2006   Deneise Lever, [place] CF-SLP Speech-Language Pathologist  Aliene Altes 05-10-2016, 2:45 PM  [facility] [address] [place], [name], [address] Phone: [phone]   Fax:  [phone]   Name: IHSAN NOMURA MRN: [id number] Date of Birth: 1935-07-26

## 2016-05-02 NOTE — Therapy (Deleted)
[facility] [address] [place], [name], [address] Phone: [phone]   Fax:  [phone]  Speech Language Pathology Treatment  Patient Details  Name: Stephen Robbins MRN: [id number] Date of Birth: 1935-08-07 Referring Provider: Alonza Bogus, D.O.  Encounter Date: 05/02/2016      End of Session - 05/02/16 1413    Visit Number 7   Number of Visits 18   Date for SLP Re-Evaluation 05/28/16   SLP Start Time 1316   SLP Stop Time  0156   SLP Time Calculation (min) 43 min   Activity Tolerance Patient tolerated treatment well      Past Medical History:  Diagnosis Date  . Arthritis    "joints; a little" (01/04/2014)  . Asymptomatic bilateral carotid artery stenosis    per duplex  05-15-2012  left >39%/   right 40-59%  . Basal cell carcinoma    nose  . Borderline diabetes   . BPH (benign prostatic hypertrophy) with urinary obstruction   . Bradycardia   . Bronchial pneumonia 1958  . Chronotropic incompetence with sinus node dysfunction (HCC)   . Compressed cervical disc   . Compression of lumbar vertebra (HCC)    L4 -- L5  . Dizzy   . Dysmetabolic syndrome   . Fatigue   . Frequency of urination   . GERD (gastroesophageal reflux disease)    occasional  . Hemorrhoids   . Hyperlipidemia   . Hypothyroidism   . Nocturia   . NSVT (nonsustained ventricular tachycardia) [facility])    cardiologist-  dr croitoru  . Pacemaker   . Urgency of urination   . Wears glasses     Past Surgical History:  Procedure Laterality Date  . BASAL CELL CARCINOMA EXCISION     nose  . CLOSED REDUCTION NASAL FRACTURE  09-01-2007  . CYSTOSCOPY N/A 12/28/2012   Procedure: CYSTOSCOPY;  Surgeon: Bernestine Amass, MD;  Location: [facility];  Service: Urology;  Laterality: N/A;  . EXERCISE TOLERENCE TEST  12-03-2012  DR CROITURO   CHRONOTROPIC INCOMPETENCE/ NORMAL RESTING BP W/ APPROPRIATE RESPONSE/ NO CHEST PAIN/ NO ST CHANGES FROM  BASELINE  . INSERT / REPLACE / REMOVE PACEMAKER  01/04/2014   [employer] model L121 serial number [id number]  . LACERATION REPAIR Right 1978   middle finger  . NEUROPLASTY / TRANSPOSITION ULNAR NERVE AT ELBOW Left 02-09-2010  . PERMANENT PACEMAKER INSERTION N/A 01/04/2014   Procedure: PERMANENT PACEMAKER INSERTION;  Surgeon: Sanda Klein, MD;  Location: [facility];  Service: Cardiovascular;  Laterality: N/A;  . TONSILLECTOMY AND ADENOIDECTOMY  1944  . TRANSURETHRAL INCISION OF PROSTATE N/A 12/28/2012   Procedure: TRANSURETHRAL INCISION OF THE PROSTATE (TUIP);  Surgeon: Bernestine Amass, MD;  Location: [facility];  Service: Urology;  Laterality: N/A;  . ULNAR NERVE TRANSPOSITION      There were no vitals filed for this visit.      Subjective Assessment - 05/02/16 1348    Subjective "I haven't been waking anybody up." re: home exercises   Patient is accompained by: Family member   Currently in Pain? No/denies               ADULT SLP TREATMENT - 05/02/16 0001      General Information   Behavior/Cognition Alert;Cooperative     Treatment Provided   Treatment provided Cognitive-Linquistic     Pain Assessment   Pain Assessment No/denies pain     Cognitive-Linquistic Treatment  Treatment focused on Dysarthria;Voice;Patient/family/caregiver education   Skilled Treatment Reassessed pt's vocal amplitude across hierarchy tasks including sustained /a/ phonation: 85.2 dB, sentence (72.5dB) and paragraph (73.5 dB) reading tasks, as well as spontaneous responses (69dB) and structured conversation (72.6 dB with min A cues). Discussed treatment plan and goals of care with patient, who has been making steady progress toward goals and is beginning to show carryover of vocal techniques to conversation following SLP instruction and min A. Provided education regarding physical and sensory effects of Parkinson's disease as they impact communication and  intelligibility.      Assessment / Recommendations / Plan   Plan Continue with current plan of care     Progression Toward Goals   Progression toward goals Progressing toward goals          SLP Education - 05/02/16 1412    Education Details Impacts of PD on speech intelligibility, potential impacts on swallowing function   Person(s) Educated Patient;Spouse   Methods Explanation;Demonstration;Verbal cues   Comprehension Verbalized understanding          SLP Short Term Goals - 05/02/16 1424      SLP SHORT TERM GOAL #1   Title pt will produce loud /a/ at average 85dB at 50cm over three consecutive therapy sessions   Baseline 04/18/16, 04/25/16, 05/02/16   Time 0   Period Weeks   Status Achieved     SLP SHORT TERM GOAL #2   Title pt will average 70dB at 50 cm in 18/20 sentence responses over three consecutive sessions with rare verbal cues   Baseline 04/04/16, 04/18/16, 05/02/16   Time 4   Period Weeks   Status Revised     SLP SHORT TERM GOAL #3   Title pt will demo 10 minutes simple conversation with average 70dB over three sessions   Baseline 04/18/16, 04/25/16, 05/02/16   Time 0   Period Weeks   Status Achieved     SLP SHORT TERM GOAL #4   Title pt will demo conversational volume of average 70dB at 50 cm over 15 minutes mod-complex/complex conversation over three sessions   Time 4   Period Weeks   Status New          SLP Long Term Goals - 05/02/16 1429      SLP LONG TERM GOAL #1   Title pt will maintain average 85 dB loud /a/ at 50 cm with abdominal breathing and proper voicing over 5 sessions   Baseline 04/18/16, 04/25/16, 05/02/16   Time 8   Period Weeks   Status Revised     SLP LONG TERM GOAL #2   Title Pt will converse audibly in noisy environment/outside of therapy room over 15 minutes with supervision cues.   Time 8   Period Weeks   Status Revised          Plan - 05/02/16 1423    Clinical Impression Statement Patient has been making excellent progress  toward goals. Frequency has been reduced to 1 visit per week due to patient's scheduling request, therefore patient requires additional time to reach targets. Patient has been demonstrating reduced need for cuing for breath support and vocal loudness at sentence and paragraph levels, and is showing emerging progress in carryover to conversation during structured therapy tasks. He will benefit from continued skilled ST to increase awareness of conversational volume and generalization/carryover of vocal effort to conversational speech for improved intelligibility at home and in the community. Based on patient's current rate of progress, anticipate all  goals will be met in 4-8 weeks.    Speech Therapy Frequency 1x /week   Duration Other (comment)  8 weeks or a total of 8 visits   Treatment/Interventions Compensatory strategies;Patient/family education;Functional tasks;Cueing hierarchy;Internal/external aids;SLP instruction and feedback   Potential to Achieve Goals Good   Potential Considerations Family/community support;Ability to learn/carryover information;Cooperation/participation level;Previous level of function   SLP Home Exercise Plan Homework assigned   Consulted and Agree with Plan of Care Patient;Family member/caregiver   Family Member Consulted wife [name]      Patient will benefit from skilled therapeutic intervention in order to improve the following deficits and impairments:   Hypokinetic Parkinsonian dysphonia  Dysarthria and anarthria      G-Codes - 05/28/16 1401    Functional Assessment Tool Used NOMS - 5    Functional Limitations Motor speech   Motor Speech Current Status [phone]) At least 20 percent but less than 40 percent impaired, limited or restricted   Motor Speech Goal Status (N8950) At least 1 percent but less than 20 percent impaired, limited or restricted      Problem List Patient Active Problem List   Diagnosis Date Noted  . Paroxysmal atrial fibrillation ([place])  02/23/2015  . Chronotropic incompetence with sinus node dysfunction (HCC)   . Pacemaker - [place] serial number [id number] 01/04/2014  . SSS (sick sinus syndrome) ([place]) 12/20/2013  . Chronotropic incompetence with autonomic dysfunction 12/20/2013  . Diabetic peripheral neuropathy ([place]) 06/17/2013  . Transaminasemia 06/17/2013  . Paralysis agitans ([place]) 06/17/2013  . Parkinson's disease ([place]) 06/17/2013  . NSVT (nonsustained ventricular tachycardia) ([place]) 12/18/2012  . Bradycardia 11/26/2012  . Dizziness, nonspecific 11/26/2012  . Cough 01/04/2012  . CARPAL TUNNEL SYNDROME, BILATERAL 01/23/2010  . HEARING LOSS 12/18/2009  . HYPOGLYCEMIA, UNSPECIFIED 10/10/2008  . Seasonal and perennial allergic rhinitis 03/26/2008  . UNS ADVRS EFF UNS RX MEDICINAL&BIOLOGICAL SBSTNC 09/17/2007  . HYPOGONADISM, MALE 04/23/2007  . DEPRESSION, MILD 02/10/2007  . BENIGN PROSTATIC HYPERTROPHY, WITH OBSTRUCTION 02/10/2007  . OSTEOARTHRITIS 02/10/2007  . LOW BACK PAIN 02/10/2007  . HYPERGLYCEMIA, BORDERLINE 02/10/2007  . Hypothyroidism 10/14/2006  . INTERNAL HEMORRHOIDS 10/14/2006  . Dyslipidemia 09/18/2006    Aliene Altes 28-May-2016, 2:40 PM  [facility] [address] [place], [name], [address] Phone: [phone]   Fax:  [phone]   Name: Stephen Robbins MRN: [id number] Date of Birth: [birth year]

## 2016-05-02 NOTE — Therapy (Signed)
[facility] [address] [place], [name], [address] Phone: [phone]   Fax:  [phone]  Occupational Therapy Treatment  Patient Details  Name: Stephen Robbins MRN: [id number] Date of Birth: November 18, 1935 Referring Provider: Dr. Wells Guiles [name]   Encounter Date: 05/02/2016      OT End of Session - 05/02/16 1521    Visit Number 7   Number of Visits 13   Date for OT Re-Evaluation 04/18/16   Authorization Type Medicare / BCBS (G-code needed)   Authorization Time Period Renewal completed 6/83/41 (cert. date 04/18/16-06/15/16)   Authorization - Visit Number 7   Authorization - Number of Visits 10   OT Start Time 9622   OT Stop Time 1456   OT Time Calculation (min) 53 min   Activity Tolerance Patient tolerated treatment well   Behavior During Therapy WFL for tasks assessed/performed      Past Medical History:  Diagnosis Date  . Arthritis    "joints; a little" (01/04/2014)  . Asymptomatic bilateral carotid artery stenosis    per duplex  05-15-2012  left >39%/   right 40-59%  . Basal cell carcinoma    nose  . Borderline diabetes   . BPH (benign prostatic hypertrophy) with urinary obstruction   . Bradycardia   . Bronchial pneumonia 1958  . Chronotropic incompetence with sinus node dysfunction (HCC)   . Compressed cervical disc   . Compression of lumbar vertebra (HCC)    L4 -- L5  . Dizzy   . Dysmetabolic syndrome   . Fatigue   . Frequency of urination   . GERD (gastroesophageal reflux disease)    occasional  . Hemorrhoids   . Hyperlipidemia   . Hypothyroidism   . Nocturia   . NSVT (nonsustained ventricular tachycardia) [facility])    cardiologist-  dr croitoru  . Pacemaker   . Urgency of urination   . Wears glasses     Past Surgical History:  Procedure Laterality Date  . BASAL CELL CARCINOMA EXCISION     nose  . CLOSED REDUCTION NASAL FRACTURE  09-01-2007  . CYSTOSCOPY N/A 12/28/2012   Procedure: CYSTOSCOPY;   Surgeon: Bernestine Amass, MD;  [facility];  Service: Urology;  Laterality: N/A;  . EXERCISE TOLERENCE TEST  12-03-2012  DR CROITURO   CHRONOTROPIC INCOMPETENCE/ NORMAL RESTING BP W/ APPROPRIATE RESPONSE/ NO CHEST PAIN/ NO ST CHANGES FROM BASELINE  . INSERT / REPLACE / REMOVE PACEMAKER  01/04/2014   [employer] model L121 serial number [id number]  . LACERATION REPAIR Right 1978   middle finger  . NEUROPLASTY / TRANSPOSITION ULNAR NERVE AT ELBOW Left 02-09-2010  . PERMANENT PACEMAKER INSERTION N/A 01/04/2014   Procedure: PERMANENT PACEMAKER INSERTION;  Surgeon: Sanda Klein, MD;  Location: [facility] CATH [facility];  Service: Cardiovascular;  Laterality: N/A;  . TONSILLECTOMY AND ADENOIDECTOMY  1944  . TRANSURETHRAL INCISION OF PROSTATE N/A 12/28/2012   Procedure: TRANSURETHRAL INCISION OF THE PROSTATE (TUIP);  Surgeon: Bernestine Amass, MD;  Location: [facility];  Service: Urology;  Laterality: N/A;  . ULNAR NERVE TRANSPOSITION      There were no vitals filed for this visit.      Subjective Assessment - 05/02/16 1408    Subjective  Pt reports exercises going well at home.  "I'm glad you gave me the PWR! website.  It was just what I was looking for."   Patient is accompained by: Family member  wife   Pertinent History pacemaker,  L ulnar nerve transposition with L 5th decr ext, ?CTR, osteoporosis, hx of low back pain, mild depression, DM, hypothyroidism, hypogonadism.    Limitations PACEMAKER   Patient Stated Goals slow progression   Currently in Pain? No/denies        Discussed progress towards goals and anticipated d/c in 3 sessions.  Educated pt on indications that pt would benefit from additional OT in the future and recommended OT screen in approx 6 months after discharge and rationale/process.  Pt verbalized understanding.  Practiced writing.  Pt wrote 5 sentences with good legibility and no decr in letter size.  Continuous "l" with min  difficulty/cues for large amplitude and smooth, controlled movements.  Pt instructed to perform at home.  Educated pt on changes that typically happen with PD to incr awareness/prevent future complications.  Pt verbalized understanding.  Functional step (forward/back) with reaching to grasp cylinder objects with emphasis on PWR! Hand and large stepping/wt. Shift with trunk rotation (min cueing).  Then functional reaching for cylinder objects floor>ceiling with trunk rotation with min cues for large amplitude movements.   Educated pt in importance of aerobic exercise and divided attention to prevent future complications (and rationale).  Pt verbalized understanding.                     OT Education - 05/02/16 1502    Education Details Reviewed PWR! website as resource (given previously) and LSVT Big info per pt questions; Educated pt regarding appropritate types of aerobic exercise (pool activities with focus on large amplitude movements/PWR! moves in water, Arm bike, stationary bike)   Person(s) Educated Patient;Spouse   Methods Explanation   Comprehension Verbalized understanding          OT Short Term Goals - 04/18/16 1857      OT SHORT TERM GOAL #1   Title Pt will be independent with PD-specific HEP.--check STGs 05/16/16   Time 4   Period Weeks   Status On-going  04/18/16:  not fully met, min cues     OT SHORT TERM GOAL #2   Title Pt will verbalize understanding of ways to prevent future complications related to PD.   Time 4   Period Weeks   Status Achieved  04/18/16:  met     OT SHORT TERM GOAL #3   Title Pt will improve PPT #4 (donning/doffing jacket) by at least 3sec with use of large amplitude movement strategies to prevent future complications.   Time 4   Period Weeks   Status New           OT Long Term Goals - 04/18/16 1858      OT LONG TERM GOAL #1   Title Pt will verbalize understanding of strategies to incr ease/safety of ADLs/IADLs and to  prevent future complications related to PD.--check LTGs 05/30/16    Time 6   Period Weeks   Status On-going  04/18/16:  continues to need min cues due to decr awareness     OT LONG TERM GOAL #2   Title Pt will improve coordination for ADLs as shown by completing 9-hole peg test in 29sec or less with dominant LUE.   Baseline 32.65sec   Time 6   Period Weeks   Status On-going     OT LONG TERM GOAL #3   Title Pt will improve bilateral functional reaching for ADLs as shown by improving score on box and blocks test  by at least 5 bilaterally.   Baseline R-39 blocks,  L-42 blocks   Time 6   Period Weeks   Status On-going     OT LONG TERM GOAL #4   Title Pt will improve efficiency with dressing as shown by buttoning/unbuttoning 3 buttons in 24sec or less.   Baseline 27.06sec   Time 6   Period Weeks   Status On-going               Plan - 05/02/16 1443    Clinical Impression Statement Pt is progressing towards goals with incr awareness of movement and PD-related deficits.   Rehab Potential Good   OT Frequency --  8 visit over 6 weeks   OT Duration 6 weeks   OT Treatment/Interventions Self-care/ADL training;Therapeutic exercise;Parrafin;DME and/or AE instruction;Therapist, nutritional;Therapeutic activities;Patient/family education;Cognitive remediation/compensation;Manual Therapy;Splinting;Neuromuscular education;Fluidtherapy;Cryotherapy;Moist Heat;Energy conservation;Passive range of motion;Therapeutic exercises   Plan functional reaching, activities with cognitive components, Arm bike   OT Home Exercise Plan Education Provided:  coordination HEP, Using large amplitude movements for ADLs, PWR! hands   Consulted and Agree with Plan of Care Patient;Family member/caregiver   Family Member Consulted wife      Patient will benefit from skilled therapeutic intervention in order to improve the following deficits and impairments:  Decreased coordination, Decreased knowledge of use  of DME, Impaired UE functional use, Improper spinal/pelvic alignment, Impaired tone, Impaired perceived functional ability, Decreased balance, Impaired sensation  Visit Diagnosis: Other symptoms and signs involving the nervous system  Other symptoms and signs involving the musculoskeletal system  Posture abnormality  Other lack of coordination  Unsteadiness on feet  Other abnormalities of gait and mobility    Problem List Patient Active Problem List   Diagnosis Date Noted  . Paroxysmal atrial fibrillation ([facility]) 02/23/2015  . Chronotropic incompetence with sinus node dysfunction (HCC)   . Pacemaker - [place] serial number [id number] 01/04/2014  . SSS (sick sinus syndrome) ([facility]) 12/20/2013  . Chronotropic incompetence with autonomic dysfunction 12/20/2013  . Diabetic peripheral neuropathy ([place]) 06/17/2013  . Transaminasemia 06/17/2013  . Paralysis agitans ([place]) 06/17/2013  . Parkinson's disease ([place]) 06/17/2013  . NSVT (nonsustained ventricular tachycardia) ([place]) 12/18/2012  . Bradycardia 11/26/2012  . Dizziness, nonspecific 11/26/2012  . Cough 01/04/2012  . CARPAL TUNNEL SYNDROME, BILATERAL 01/23/2010  . HEARING LOSS 12/18/2009  . HYPOGLYCEMIA, UNSPECIFIED 10/10/2008  . Seasonal and perennial allergic rhinitis 03/26/2008  . UNS ADVRS EFF UNS RX MEDICINAL&BIOLOGICAL SBSTNC 09/17/2007  . HYPOGONADISM, MALE 04/23/2007  . DEPRESSION, MILD 02/10/2007  . BENIGN PROSTATIC HYPERTROPHY, WITH OBSTRUCTION 02/10/2007  . OSTEOARTHRITIS 02/10/2007  . LOW BACK PAIN 02/10/2007  . HYPERGLYCEMIA, BORDERLINE 02/10/2007  . Hypothyroidism 10/14/2006  . INTERNAL HEMORRHOIDS 10/14/2006  . Dyslipidemia 09/18/2006    [facility] 05/02/2016, 3:22 PM  [facility] [address] [place], [name], [address] Phone: [phone]   Fax:  [phone]  Name: Stephen Robbins MRN: [id number] Date of Birth:  04/11/1935   Vianne Bulls, OTR/L [facility] [address] [place], [place]  [address] [phone] phone [phone] 05/02/16 3:29 PM

## 2016-05-07 ENCOUNTER — Ambulatory Visit: Payer: Medicare Other | Admitting: Occupational Therapy

## 2016-05-08 ENCOUNTER — Encounter: Payer: Medicare Other | Admitting: Occupational Therapy

## 2016-05-14 ENCOUNTER — Encounter: Payer: Medicare Other | Admitting: Occupational Therapy

## 2016-05-15 ENCOUNTER — Encounter: Payer: Medicare Other | Admitting: Occupational Therapy

## 2016-05-21 ENCOUNTER — Ambulatory Visit: Payer: Medicare Other | Admitting: Speech Pathology

## 2016-05-21 ENCOUNTER — Ambulatory Visit: Payer: Medicare Other | Admitting: Occupational Therapy

## 2016-05-29 ENCOUNTER — Encounter: Payer: Medicare Other | Admitting: Internal Medicine

## 2016-05-30 ENCOUNTER — Ambulatory Visit: Payer: Medicare Other | Attending: Internal Medicine | Admitting: Occupational Therapy

## 2016-05-30 ENCOUNTER — Ambulatory Visit: Payer: Medicare Other

## 2016-05-30 DIAGNOSIS — R2689 Other abnormalities of gait and mobility: Secondary | ICD-10-CM | POA: Insufficient documentation

## 2016-05-30 DIAGNOSIS — R29818 Other symptoms and signs involving the nervous system: Secondary | ICD-10-CM

## 2016-05-30 DIAGNOSIS — R471 Dysarthria and anarthria: Secondary | ICD-10-CM | POA: Insufficient documentation

## 2016-05-30 DIAGNOSIS — R293 Abnormal posture: Secondary | ICD-10-CM | POA: Insufficient documentation

## 2016-05-30 DIAGNOSIS — R278 Other lack of coordination: Secondary | ICD-10-CM | POA: Insufficient documentation

## 2016-05-30 DIAGNOSIS — R2681 Unsteadiness on feet: Secondary | ICD-10-CM | POA: Diagnosis not present

## 2016-05-30 DIAGNOSIS — R29898 Other symptoms and signs involving the musculoskeletal system: Secondary | ICD-10-CM | POA: Insufficient documentation

## 2016-05-30 NOTE — Therapy (Signed)
[facility] [address] [place], [name], [address] Phone: [phone]   Fax:  [phone]  Speech Language Pathology Treatment  Patient Details  Name: Stephen Robbins MRN: [id number] Date of Birth: Aug 17, 1935 Referring Provider: Alonza Bogus, D.O.  Encounter Date: 05/30/2016      End of Session - 05/30/16 1646    Visit Number 8   Number of Visits 13   Date for SLP Re-Evaluation 06/28/16   SLP Start Time 8182   SLP Stop Time  1445   SLP Time Calculation (min) 43 min   Activity Tolerance Patient tolerated treatment well      Past Medical History:  Diagnosis Date  . Arthritis    "joints; a little" (01/04/2014)  . Asymptomatic bilateral carotid artery stenosis    per duplex  05-15-2012  left >39%/   right 40-59%  . Basal cell carcinoma    nose  . Borderline diabetes   . BPH (benign prostatic hypertrophy) with urinary obstruction   . Bradycardia   . Bronchial pneumonia 1958  . Chronotropic incompetence with sinus node dysfunction (HCC)   . Compressed cervical disc   . Compression of lumbar vertebra (HCC)    L4 -- L5  . Dizzy   . Dysmetabolic syndrome   . Fatigue   . Frequency of urination   . GERD (gastroesophageal reflux disease)    occasional  . Hemorrhoids   . Hyperlipidemia   . Hypothyroidism   . Nocturia   . NSVT (nonsustained ventricular tachycardia) [facility])    cardiologist-  dr croitoru  . Pacemaker   . Urgency of urination   . Wears glasses     Past Surgical History:  Procedure Laterality Date  . BASAL CELL CARCINOMA EXCISION     nose  . CLOSED REDUCTION NASAL FRACTURE  09-01-2007  . CYSTOSCOPY N/A 12/28/2012   Procedure: CYSTOSCOPY;  Surgeon: Bernestine Amass, MD;  Location: [facility];  Service: Urology;  Laterality: N/A;  . EXERCISE TOLERENCE TEST  12-03-2012  DR CROITURO   CHRONOTROPIC INCOMPETENCE/ NORMAL RESTING BP W/ APPROPRIATE RESPONSE/ NO CHEST PAIN/ NO ST CHANGES FROM  BASELINE  . INSERT / REPLACE / REMOVE PACEMAKER  01/04/2014   [employer] model L121 serial number [id number]  . LACERATION REPAIR Right 1978   middle finger  . NEUROPLASTY / TRANSPOSITION ULNAR NERVE AT ELBOW Left 02-09-2010  . PERMANENT PACEMAKER INSERTION N/A 01/04/2014   Procedure: PERMANENT PACEMAKER INSERTION;  Surgeon: Sanda Klein, MD;  Location: [facility] CATH LAB;  Service: Cardiovascular;  Laterality: N/A;  . TONSILLECTOMY AND ADENOIDECTOMY  1944  . TRANSURETHRAL INCISION OF PROSTATE N/A 12/28/2012   Procedure: TRANSURETHRAL INCISION OF THE PROSTATE (TUIP);  Surgeon: Bernestine Amass, MD;  Location: [facility];  Service: Urology;  Laterality: N/A;  . ULNAR NERVE TRANSPOSITION      There were no vitals filed for this visit.      Subjective Assessment - 05/30/16 1405    Subjective Pt cannot verify that strength of his voice is different than prior to ST.   Patient is accompained by: --  wife               ADULT SLP TREATMENT - 05/30/16 1406      General Information   Behavior/Cognition Alert;Cooperative     Treatment Provided   Treatment provided Cognitive-Linquistic     Cognitive-Linquistic Treatment   Treatment focused on Dysarthria;Voice;Patient/family/caregiver education   Skilled Treatment When asked if  pt felt abdominal push with loud /a/, pt replied "a little bit" - after loud /a/ produced at mid-upper 80sdB average at 50cm away from sound source. In 2-3 minute dialogues pt maintained WNL volumes of upper 60s (at 50 cm away). In 7 minutes mod complex conversation pt attained WNL volumes as well. Walking around the clinic in mod noisy environment pt maintained 100% intelligibility in approx 10 minutes of conversation.      Assessment / Recommendations / Plan   Plan Continue with current plan of care     Progression Toward Goals   Progression toward goals Progressing toward goals            SLP Short Term Goals - 05/30/16 1430       SLP SHORT TERM GOAL #1   Title pt will produce loud /a/ at average 85dB at 50cm over three consecutive therapy sessions   Baseline --   Time --   Period --   Status Achieved     SLP SHORT TERM GOAL #2   Title pt will average 70dB at 50 cm in 18/20 sentence responses over three consecutive sessions with rare verbal cues   Status Achieved     SLP SHORT TERM GOAL #3   Title pt will demo 10 minutes simple conversation with average 70dB over three sessions   Status Achieved     SLP SHORT TERM GOAL #4   Title pt will demo conversational volume of average 70dB at 50 cm over 15 minutes mod-complex/complex conversation over three sessions   Baseline 05-30-16   Time 4   Period Weeks   Status On-going          SLP Long Term Goals - 05/30/16 1431      SLP LONG TERM GOAL #1   Title pt will maintain average 85 dB loud /a/ at 50 cm with abdominal breathing and proper voicing over 5 sessions   Baseline 05/30/16   Time 8   Period Weeks   Status On-going     SLP LONG TERM GOAL #2   Title Pt will converse audibly in noisy environment/outside of therapy room over 15 minutes with supervision cues.   Time 8   Period Weeks   Status On-going          Plan - 05/30/16 1647    Clinical Impression Statement Patient continutes making excellent progress toward goals. Patient generation of multisentence spontaneous speech and mod comlex conversational speech was produced with WNL loudness today. He will benefit from continued skilled ST to increase awareness of conversational volume and generalization/carryover of vocal effort to conversational speech for improved intelligibility at home and in the community. Suspect d/c in 2-4 more visits   Speech Therapy Frequency 1x /week   Duration Other (comment)  8 weeks or a total of 8 visits   Treatment/Interventions Compensatory strategies;Patient/family education;Functional tasks;Cueing hierarchy;Internal/external aids;SLP instruction and feedback    Potential to Achieve Goals Good   Potential Considerations Family/community support;Ability to learn/carryover information;Cooperation/participation level;Previous level of function   SLP Home Exercise Plan Homework assigned   Consulted and Agree with Plan of Care Patient;Family member/caregiver   Family Member Consulted wife [name]      Patient will benefit from skilled therapeutic intervention in order to improve the following deficits and impairments:   Dysarthria and anarthria    Problem List Patient Active Problem List   Diagnosis Date Noted  . Paroxysmal atrial fibrillation ([place]) 02/23/2015  . Chronotropic incompetence with sinus node dysfunction (HCC)   .  Pacemaker - [place] serial number [id number] 01/04/2014  . SSS (sick sinus syndrome) ([place]) 12/20/2013  . Chronotropic incompetence with autonomic dysfunction 12/20/2013  . Diabetic peripheral neuropathy ([place]) 06/17/2013  . Transaminasemia 06/17/2013  . Paralysis agitans ([place]) 06/17/2013  . Parkinson's disease ([place]) 06/17/2013  . NSVT (nonsustained ventricular tachycardia) ([place]) 12/18/2012  . Bradycardia 11/26/2012  . Dizziness, nonspecific 11/26/2012  . Cough 01/04/2012  . CARPAL TUNNEL SYNDROME, BILATERAL 01/23/2010  . HEARING LOSS 12/18/2009  . HYPOGLYCEMIA, UNSPECIFIED 10/10/2008  . Seasonal and perennial allergic rhinitis 03/26/2008  . UNS ADVRS EFF UNS RX MEDICINAL&BIOLOGICAL SBSTNC 09/17/2007  . HYPOGONADISM, MALE 04/23/2007  . DEPRESSION, MILD 02/10/2007  . BENIGN PROSTATIC HYPERTROPHY, WITH OBSTRUCTION 02/10/2007  . OSTEOARTHRITIS 02/10/2007  . LOW BACK PAIN 02/10/2007  . HYPERGLYCEMIA, BORDERLINE 02/10/2007  . Hypothyroidism 10/14/2006  . INTERNAL HEMORRHOIDS 10/14/2006  . Dyslipidemia 09/18/2006    [facility], [place]  05/30/2016, 4:50 PM  [facility] [address] [place], [name], [address] Phone:  [phone]   Fax:  [phone]   Name: KILIAN SCHWARTZ MRN: [id number] Date of Birth: 1935/10/17

## 2016-05-30 NOTE — Therapy (Signed)
[facility] [address] [place], [name], [address] Phone: [phone]   Fax:  [phone]  Occupational Therapy Treatment  Patient Details  Name: Stephen Robbins MRN: [id number] Date of Birth: [birth year] Referring Provider: Dr. Wells Guiles [name]   Encounter Date: 05/30/2016      OT End of Session - 05/30/16 1323    Visit Number 8   Number of Visits 13   Date for OT Re-Evaluation 06/15/16   Authorization Type Medicare / BCBS (G-code needed)   Authorization - Visit Number 8   Authorization - Number of Visits 10   OT Start Time 6384   OT Stop Time 1400   OT Time Calculation (min) 43 min   Activity Tolerance Patient tolerated treatment well   Behavior During Therapy [facility] for tasks assessed/performed      Past Medical History:  Diagnosis Date  . Arthritis    "joints; a little" (01/04/2014)  . Asymptomatic bilateral carotid artery stenosis    per duplex  05-15-2012  left >39%/   right 40-59%  . Basal cell carcinoma    nose  . Borderline diabetes   . BPH (benign prostatic hypertrophy) with urinary obstruction   . Bradycardia   . Bronchial pneumonia 1958  . Chronotropic incompetence with sinus node dysfunction (HCC)   . Compressed cervical disc   . Compression of lumbar vertebra (HCC)    L4 -- L5  . Dizzy   . Dysmetabolic syndrome   . Fatigue   . Frequency of urination   . GERD (gastroesophageal reflux disease)    occasional  . Hemorrhoids   . Hyperlipidemia   . Hypothyroidism   . Nocturia   . NSVT (nonsustained ventricular tachycardia) [facility])    cardiologist-  dr croitoru  . Pacemaker   . Urgency of urination   . Wears glasses     Past Surgical History:  Procedure Laterality Date  . BASAL CELL CARCINOMA EXCISION     nose  . CLOSED REDUCTION NASAL FRACTURE  09-01-2007  . CYSTOSCOPY N/A 12/28/2012   Procedure: CYSTOSCOPY;  Surgeon: Bernestine Amass, MD;  Location: [facility];  Service: Urology;   Laterality: N/A;  . EXERCISE TOLERENCE TEST  12-03-2012  DR CROITURO   CHRONOTROPIC INCOMPETENCE/ NORMAL RESTING BP W/ APPROPRIATE RESPONSE/ NO CHEST PAIN/ NO ST CHANGES FROM BASELINE  . INSERT / REPLACE / REMOVE PACEMAKER  01/04/2014   [employer] model L121 serial number [id number]  . LACERATION REPAIR Right 1978   middle finger  . NEUROPLASTY / TRANSPOSITION ULNAR NERVE AT ELBOW Left 02-09-2010  . PERMANENT PACEMAKER INSERTION N/A 01/04/2014   Procedure: PERMANENT PACEMAKER INSERTION;  Surgeon: Sanda Klein, MD;  Location: [place];  Service: Cardiovascular;  Laterality: N/A;  . TONSILLECTOMY AND ADENOIDECTOMY  1944  . TRANSURETHRAL INCISION OF PROSTATE N/A 12/28/2012   Procedure: TRANSURETHRAL INCISION OF THE PROSTATE (TUIP);  Surgeon: Bernestine Amass, MD;  Location: [facility];  Service: Urology;  Laterality: N/A;  . ULNAR NERVE TRANSPOSITION      There were no vitals filed for this visit.      Subjective Assessment - 05/30/16 1511    Subjective  Denies pain   Pertinent History pacemaker, L ulnar nerve transposition with L 5th decr ext, ?CTR, osteoporosis, hx of low back pain, mild depression, DM, hypothyroidism, hypogonadism.    Limitations PACEMAKER   Patient Stated Goals slow progression   Currently in Pain? No/denies  Treatment: Arm bike x 6 mins level 1 for conditioning, pt was able to maintain 40 rpm Dynamic step and reach forwards and backwards to both sides, with a cognitive component to copy small peg design, mod v.c. For large amplitude movements, min v.c. For correct design. Donning / doffing jacket using adapted strategy with good success, pt met goal. Flipping and dealing playing cards with large amplitude movements, min v.c./ demonstration.                       OT Short Term Goals - 05/30/16 1353      OT SHORT TERM GOAL #1   Title Pt will be independent with PD-specific HEP.--check STGs 05/16/16    Time 4   Period Weeks   Status On-going  04/18/16:  not fully met, min cues     OT SHORT TERM GOAL #2   Title Pt will verbalize understanding of ways to prevent future complications related to PD.   Time 4   Period Weeks   Status Achieved  04/18/16:  met     OT SHORT TERM GOAL #3   Title Pt will improve PPT #4 (donning/doffing jacket) by at least 3sec with use of large amplitude movement strategies to prevent future complications.   Time 4   Period Weeks   Status Achieved  10.62 secs           OT Long Term Goals - 05/30/16 1353      OT LONG TERM GOAL #1   Title Pt will verbalize understanding of strategies to incr ease/safety of ADLs/IADLs and to prevent future complications related to PD.--check LTGs 05/30/16    Time 6   Period Weeks   Status On-going  04/18/16:  continues to need min cues due to decr awareness     OT LONG TERM GOAL #2   Title Pt will improve coordination for ADLs as shown by completing 9-hole peg test in 29sec or less with dominant LUE.   Baseline 32.65sec   Time 6   Period Weeks   Status On-going     OT LONG TERM GOAL #3   Title Pt will improve bilateral functional reaching for ADLs as shown by improving score on box and blocks test  by at least 5 bilaterally.   Baseline R-39 blocks, L-42 blocks   Time 6   Period Weeks   Status On-going     OT LONG TERM GOAL #4   Title Pt will improve efficiency with dressing as shown by buttoning/unbuttoning 3 buttons in 24sec or less.   Baseline 27.06sec   Time 6   Period Weeks   Status On-going               Plan - 05/30/16 1512    Clinical Impression Statement Pt is progressing towards goals. Pt demonstrates increased challenge with pysical tasks when a cognitive component is added.   Rehab Potential Good   OT Frequency --  8 visits    OT Duration 6 weeks   OT Treatment/Interventions Self-care/ADL training;Therapeutic exercise;Parrafin;DME and/or AE instruction;Wellsite geologist;Therapeutic activities;Patient/family education;Cognitive remediation/compensation;Manual Therapy;Splinting;Neuromuscular education;Fluidtherapy;Cryotherapy;Moist Heat;Energy conservation;Passive range of motion;Therapeutic exercises   Plan continue checking goals, anticipate d/c in next 2 visits   OT Home Exercise Plan Education Provided:  coordination HEP, Using large amplitude movements for ADLs, PWR! hands   Consulted and Agree with Plan of Care Patient   Family Member Consulted wife      Patient will benefit from skilled  therapeutic intervention in order to improve the following deficits and impairments:  Decreased coordination, Decreased knowledge of use of DME, Impaired UE functional use, Improper spinal/pelvic alignment, Impaired tone, Impaired perceived functional ability, Decreased balance, Impaired sensation  Visit Diagnosis: Other symptoms and signs involving the nervous system  Other symptoms and signs involving the musculoskeletal system  Other lack of coordination    Problem List Patient Active Problem List   Diagnosis Date Noted  . Paroxysmal atrial fibrillation ([place]) 02/23/2015  . Chronotropic incompetence with sinus node dysfunction (HCC)   . Pacemaker - [place] serial number [id number] 01/04/2014  . SSS (sick sinus syndrome) ([place]) 12/20/2013  . Chronotropic incompetence with autonomic dysfunction 12/20/2013  . Diabetic peripheral neuropathy ([place]) 06/17/2013  . Transaminasemia 06/17/2013  . Paralysis agitans ([place]) 06/17/2013  . Parkinson's disease ([place]) 06/17/2013  . NSVT (nonsustained ventricular tachycardia) ([place]) 12/18/2012  . Bradycardia 11/26/2012  . Dizziness, nonspecific 11/26/2012  . Cough 01/04/2012  . CARPAL TUNNEL SYNDROME, BILATERAL 01/23/2010  . HEARING LOSS 12/18/2009  . HYPOGLYCEMIA, UNSPECIFIED 10/10/2008  . Seasonal and perennial allergic rhinitis 03/26/2008  . UNS ADVRS EFF UNS RX MEDICINAL&BIOLOGICAL SBSTNC  09/17/2007  . HYPOGONADISM, MALE 04/23/2007  . DEPRESSION, MILD 02/10/2007  . BENIGN PROSTATIC HYPERTROPHY, WITH OBSTRUCTION 02/10/2007  . OSTEOARTHRITIS 02/10/2007  . LOW BACK PAIN 02/10/2007  . HYPERGLYCEMIA, BORDERLINE 02/10/2007  . Hypothyroidism 10/14/2006  . INTERNAL HEMORRHOIDS 10/14/2006  . Dyslipidemia 09/18/2006    RINE,KATHRYN 05/30/2016, 3:15 PM Theone Murdoch, OTR/L [phone] Phone: [phone] 3:17 PM 05/30/16 [facility] [address] [place], [name], [address] Phone: [phone]   Fax:  [phone]  Name: Stephen Robbins MRN: [id number] Date of Birth: 1935-10-29

## 2016-06-06 ENCOUNTER — Ambulatory Visit: Payer: Medicare Other

## 2016-06-06 ENCOUNTER — Ambulatory Visit: Payer: Medicare Other | Admitting: Occupational Therapy

## 2016-06-06 DIAGNOSIS — R2681 Unsteadiness on feet: Secondary | ICD-10-CM

## 2016-06-06 DIAGNOSIS — R29818 Other symptoms and signs involving the nervous system: Secondary | ICD-10-CM

## 2016-06-06 DIAGNOSIS — R29898 Other symptoms and signs involving the musculoskeletal system: Secondary | ICD-10-CM | POA: Diagnosis not present

## 2016-06-06 DIAGNOSIS — R278 Other lack of coordination: Secondary | ICD-10-CM

## 2016-06-06 DIAGNOSIS — R293 Abnormal posture: Secondary | ICD-10-CM | POA: Diagnosis not present

## 2016-06-06 DIAGNOSIS — R471 Dysarthria and anarthria: Secondary | ICD-10-CM

## 2016-06-06 DIAGNOSIS — R2689 Other abnormalities of gait and mobility: Secondary | ICD-10-CM | POA: Diagnosis not present

## 2016-06-06 NOTE — Patient Instructions (Signed)
Continue with loud "ah" repetitions after discharge, every day.

## 2016-06-06 NOTE — Therapy (Signed)
[facility] [address] [place], [name], [address] Phone: [phone]   Fax:  [phone]  Speech Language Pathology Treatment  Patient Details  Name: Stephen Robbins MRN: [id number] Date of Birth: Dec 17, 1935 Referring Provider: Alonza Bogus, D.O.  Encounter Date: 06/06/2016      End of Session - 06/06/16 1703    Visit Number 9   Number of Visits 13   Date for SLP Re-Evaluation 06/28/16   SLP Start Time 1935   SLP Stop Time  1445   SLP Time Calculation (min) 42 min   Activity Tolerance Patient tolerated treatment well      Past Medical History:  Diagnosis Date  . Arthritis    "joints; a little" (01/04/2014)  . Asymptomatic bilateral carotid artery stenosis    per duplex  05-15-2012  left >39%/   right 40-59%  . Basal cell carcinoma    nose  . Borderline diabetes   . BPH (benign prostatic hypertrophy) with urinary obstruction   . Bradycardia   . Bronchial pneumonia 1958  . Chronotropic incompetence with sinus node dysfunction (HCC)   . Compressed cervical disc   . Compression of lumbar vertebra (HCC)    L4 -- L5  . Dizzy   . Dysmetabolic syndrome   . Fatigue   . Frequency of urination   . GERD (gastroesophageal reflux disease)    occasional  . Hemorrhoids   . Hyperlipidemia   . Hypothyroidism   . Nocturia   . NSVT (nonsustained ventricular tachycardia) [facility])    cardiologist-  dr croitoru  . Pacemaker   . Urgency of urination   . Wears glasses     Past Surgical History:  Procedure Laterality Date  . BASAL CELL CARCINOMA EXCISION     nose  . CLOSED REDUCTION NASAL FRACTURE  09-01-2007  . CYSTOSCOPY N/A 12/28/2012   Procedure: CYSTOSCOPY;  Surgeon: Bernestine Amass, MD;  Location: [facility];  Service: Urology;  Laterality: N/A;  . EXERCISE TOLERENCE TEST  12-03-2012  DR CROITURO   CHRONOTROPIC INCOMPETENCE/ NORMAL RESTING BP W/ APPROPRIATE RESPONSE/ NO CHEST PAIN/ NO ST CHANGES  FROM BASELINE  . INSERT / REPLACE / REMOVE PACEMAKER  01/04/2014   [employer] model L121 serial number [id number]  . LACERATION REPAIR Right 1978   middle finger  . NEUROPLASTY / TRANSPOSITION ULNAR NERVE AT ELBOW Left 02-09-2010  . PERMANENT PACEMAKER INSERTION N/A 01/04/2014   Procedure: PERMANENT PACEMAKER INSERTION;  Surgeon: Sanda Klein, MD;  Location: [facility];  Service: Cardiovascular;  Laterality: N/A;  . TONSILLECTOMY AND ADENOIDECTOMY  1944  . TRANSURETHRAL INCISION OF PROSTATE N/A 12/28/2012   Procedure: TRANSURETHRAL INCISION OF THE PROSTATE (TUIP);  Surgeon: Bernestine Amass, MD;  Location: [facility];  Service: Urology;  Laterality: N/A;  . ULNAR NERVE TRANSPOSITION      There were no vitals filed for this visit.      Subjective Assessment - 06/06/16 1407    Subjective "Still the tickle in the throat."   Patient is accompained by: Family member  wife               ADULT SLP TREATMENT - 06/06/16 1407      General Information   Behavior/Cognition Alert;Cooperative;Pleasant mood     Treatment Provided   Treatment provided Cognitive-Linquistic     Cognitive-Linquistic Treatment   Treatment focused on Dysarthria;Voice   Skilled Treatment SLP told pt to feel abdominal push with loud /a/,  with averages in mid-80sdB at 50cm away from sound source. In multisentence responses of mod complex nature pt maintained WNL volumes of upper 60s (at 50 cm away). In a 20 minute mod complex conversation pt consistently attained WNL volumes as well. Walking around the clinic in mod noisy environment pt maintained 100% intelligibility in 8-10 minutes of conversation.      Assessment / Recommendations / Plan   Plan Continue with current plan of care     Progression Toward Goals   Progression toward goals Progressing toward goals            SLP Short Term Goals - 05/30/16 1430      SLP SHORT TERM GOAL #1   Title pt will produce loud /a/ at  average 85dB at 50cm over three consecutive therapy sessions   Baseline --   Time --   Period --   Status Achieved     SLP SHORT TERM GOAL #2   Title pt will average 70dB at 50 cm in 18/20 sentence responses over three consecutive sessions with rare verbal cues   Status Achieved     SLP SHORT TERM GOAL #3   Title pt will demo 10 minutes simple conversation with average 70dB over three sessions   Status Achieved     SLP SHORT TERM GOAL #4   Title pt will demo conversational volume of average 70dB at 50 cm over 15 minutes mod-complex/complex conversation over three sessions   Baseline 05-30-16   Time 4   Period Weeks   Status On-going          SLP Long Term Goals - 06/06/16 1705      SLP LONG TERM GOAL #1   Title pt will maintain average 85 dB loud /a/ at 50 cm with abdominal breathing and proper voicing over 5 sessions   Baseline 05/30/16, 06-06-16   Time 6   Period Weeks   Status On-going     SLP LONG TERM GOAL #2   Title Pt will converse audibly in noisy environment/outside of therapy room over 15 minutes with supervision cues.   Time --   Period --   Status Achieved          Plan - 06/06/16 1408    Clinical Impression Statement Patient continutes making excellent progress toward goals. Patient generation of multisentence spontaneous speech and mod complex conversational speech was produced with WNL loudness today. He will benefit from continued skilled ST to increase awareness of conversational volume and generalization/carryover of vocal effort to conversational speech for improved intelligibility at home and in the community. However, if pt does not schedule a visit in the next two weeks he will be discharged and agrees with this plan.   Speech Therapy Frequency 1x /week   Duration Other (comment)  8 weeks or a total of 8 visits   Treatment/Interventions Compensatory strategies;Patient/family education;Functional tasks;Cueing hierarchy;Internal/external aids;SLP  instruction and feedback   Potential to Achieve Goals Good   Potential Considerations Family/community support;Ability to learn/carryover information;Cooperation/participation level;Previous level of function   SLP Home Exercise Plan Homework assigned   Consulted and Agree with Plan of Care Patient;Family member/caregiver   Family Member Consulted wife [name]      Patient will benefit from skilled therapeutic intervention in order to improve the following deficits and impairments:   Dysarthria and anarthria    Problem List Patient Active Problem List   Diagnosis Date Noted  . Paroxysmal atrial fibrillation ([place]) 02/23/2015  . Chronotropic incompetence with  sinus node dysfunction (HCC)   . Pacemaker - [place] serial number [id number] 01/04/2014  . SSS (sick sinus syndrome) ([place]) 12/20/2013  . Chronotropic incompetence with autonomic dysfunction 12/20/2013  . Diabetic peripheral neuropathy ([place]) 06/17/2013  . Transaminasemia 06/17/2013  . Paralysis agitans ([place]) 06/17/2013  . Parkinson's disease ([place]) 06/17/2013  . NSVT (nonsustained ventricular tachycardia) ([place]) 12/18/2012  . Bradycardia 11/26/2012  . Dizziness, nonspecific 11/26/2012  . Cough 01/04/2012  . CARPAL TUNNEL SYNDROME, BILATERAL 01/23/2010  . HEARING LOSS 12/18/2009  . HYPOGLYCEMIA, UNSPECIFIED 10/10/2008  . Seasonal and perennial allergic rhinitis 03/26/2008  . UNS ADVRS EFF UNS RX MEDICINAL&BIOLOGICAL SBSTNC 09/17/2007  . HYPOGONADISM, MALE 04/23/2007  . DEPRESSION, MILD 02/10/2007  . BENIGN PROSTATIC HYPERTROPHY, WITH OBSTRUCTION 02/10/2007  . OSTEOARTHRITIS 02/10/2007  . LOW BACK PAIN 02/10/2007  . HYPERGLYCEMIA, BORDERLINE 02/10/2007  . Hypothyroidism 10/14/2006  . INTERNAL HEMORRHOIDS 10/14/2006  . Dyslipidemia 09/18/2006    [facility] ,[place], [place]  06/06/2016, 5:06 PM  [facility] [address] [place],  [name], [address] Phone: [phone]   Fax:  [phone]   Name: HAMZA EMPSON MRN: [id number] Date of Birth: 10/06/1935

## 2016-06-06 NOTE — Therapy (Signed)
[facility] [address] [place], [name], [address] Phone: [phone]   Fax:  [phone]  Occupational Therapy Treatment  Patient Details  Name: Stephen Robbins MRN: [id number] Date of Birth: 1935/08/09 Referring Provider: Dr. Wells Guiles [name]   Encounter Date: 06/06/2016      OT End of Session - 06/06/16 1336    Visit Number 9   Number of Visits 13   Date for OT Re-Evaluation 06/15/16   Authorization Type Medicare / BCBS (G-code needed)   Authorization Time Period Renewal completed 2/59/56 (cert. date 04/18/16-06/15/16)   Authorization - Visit Number 9   Authorization - Number of Visits 10   OT Start Time 1332  arrived late due to traffic   OT Stop Time 1400   OT Time Calculation (min) 28 min   Activity Tolerance Patient tolerated treatment well   Behavior During Therapy [facility] for tasks assessed/performed      Past Medical History:  Diagnosis Date  . Arthritis    "joints; a little" (01/04/2014)  . Asymptomatic bilateral carotid artery stenosis    per duplex  05-15-2012  left >39%/   right 40-59%  . Basal cell carcinoma    nose  . Borderline diabetes   . BPH (benign prostatic hypertrophy) with urinary obstruction   . Bradycardia   . Bronchial pneumonia 1958  . Chronotropic incompetence with sinus node dysfunction (HCC)   . Compressed cervical disc   . Compression of lumbar vertebra (HCC)    L4 -- L5  . Dizzy   . Dysmetabolic syndrome   . Fatigue   . Frequency of urination   . GERD (gastroesophageal reflux disease)    occasional  . Hemorrhoids   . Hyperlipidemia   . Hypothyroidism   . Nocturia   . NSVT (nonsustained ventricular tachycardia) [facility])    cardiologist-  dr croitoru  . Pacemaker   . Urgency of urination   . Wears glasses     Past Surgical History:  Procedure Laterality Date  . BASAL CELL CARCINOMA EXCISION     nose  . CLOSED REDUCTION NASAL FRACTURE  09-01-2007  . CYSTOSCOPY N/A  12/28/2012   Procedure: CYSTOSCOPY;  Surgeon: Bernestine Amass, MD;  Location: [facility];  Service: Urology;  Laterality: N/A;  . EXERCISE TOLERENCE TEST  12-03-2012  DR CROITURO   CHRONOTROPIC INCOMPETENCE/ NORMAL RESTING BP W/ APPROPRIATE RESPONSE/ NO CHEST PAIN/ NO ST CHANGES FROM BASELINE  . INSERT / REPLACE / REMOVE PACEMAKER  01/04/2014   [employer] model L121 serial number [id number]  . LACERATION REPAIR Right 1978   middle finger  . NEUROPLASTY / TRANSPOSITION ULNAR NERVE AT ELBOW Left 02-09-2010  . PERMANENT PACEMAKER INSERTION N/A 01/04/2014   Procedure: PERMANENT PACEMAKER INSERTION;  Surgeon: Sanda Klein, MD;  Location: [place] CATH LAB;  Service: Cardiovascular;  Laterality: N/A;  . TONSILLECTOMY AND ADENOIDECTOMY  1944  . TRANSURETHRAL INCISION OF PROSTATE N/A 12/28/2012   Procedure: TRANSURETHRAL INCISION OF THE PROSTATE (TUIP);  Surgeon: Bernestine Amass, MD;  Location: [facility];  Service: Urology;  Laterality: N/A;  . ULNAR NERVE TRANSPOSITION      There were no vitals filed for this visit.      Subjective Assessment - 06/06/16 1334    Subjective  Pt reports that he has been doing more exercises at home   Pertinent History pacemaker, L ulnar nerve transposition with L 5th decr ext, ?CTR, osteoporosis, hx of low back pain, mild depression,  DM, hypothyroidism, hypogonadism.    Limitations PACEMAKER   Patient Stated Goals slow progression   Currently in Pain? No/denies       Flipping cards using large amplitude movements with each hand with min v.c.  Fastening/unfastening buttons with min cues for PWR! Hands.  Checked goal--see below for time.     PWR! Multi-directional Movements:    Stepping to given sequence with min difficulty/cues for dual task/cognitive component.   Then multi-directional reaching to given sequence with min-mod difficulty/cues for dual task/cognitive component.   Followed by multi-directional  reaching with stepping with min-mod difficulty/cues for dual task/cognitive component.     Then repeated reaching and stepping while calling out given colors with mod difficulties/cues for dual task/additional cognitive component.  However, all improved with repetition.                        OT Education - 06/06/16 1711    Education Details Adding Cognitive components to exercises and rationale; Given example of multi-directional movements with saying colors handout and discussed other examples    Person(s) Educated Patient;Spouse   Methods Explanation;Demonstration;Verbal cues;Handout   Comprehension Verbalized understanding;Returned demonstration;Verbal cues required          OT Short Term Goals - 05/30/16 1353      OT SHORT TERM GOAL #1   Title Pt will be independent with PD-specific HEP.--check STGs 05/16/16   Time 4   Period Weeks   Status On-going  04/18/16:  not fully met, min cues     OT SHORT TERM GOAL #2   Title Pt will verbalize understanding of ways to prevent future complications related to PD.   Time 4   Period Weeks   Status Achieved  04/18/16:  met     OT SHORT TERM GOAL #3   Title Pt will improve PPT #4 (donning/doffing jacket) by at least 3sec with use of large amplitude movement strategies to prevent future complications.   Time 4   Period Weeks   Status Achieved  10.62 secs           OT Long Term Goals - 06/06/16 1342      OT LONG TERM GOAL #1   Title Pt will verbalize understanding of strategies to incr ease/safety of ADLs/IADLs and to prevent future complications related to PD.--check LTGs 05/30/16    Time 6   Period Weeks   Status On-going  04/18/16:  continues to need min cues due to decr awareness     OT LONG TERM GOAL #2   Title Pt will improve coordination for ADLs as shown by completing 9-hole peg test in 29sec or less with dominant LUE.   Baseline 32.65sec   Time 6   Period Weeks   Status On-going     OT LONG TERM  GOAL #3   Title Pt will improve bilateral functional reaching for ADLs as shown by improving score on box and blocks test  by at least 5 bilaterally.   Baseline R-39 blocks, L-42 blocks   Time 6   Period Weeks   Status On-going     OT LONG TERM GOAL #4   Title Pt will improve efficiency with dressing as shown by buttoning/unbuttoning 3 buttons in 24sec or less.   Baseline 27.06sec   Time 6   Period Weeks   Status Not Met  06/06/16:  30.25 sec                Plan -  06/06/16 1336    Clinical Impression Statement Pt is progressing towards goals.  Pt demo incr difficulty maintaining large amplitude movements with added cognitive task.   Rehab Potential Good   OT Frequency --  8 visits    OT Duration 6 weeks   OT Treatment/Interventions Self-care/ADL training;Therapeutic exercise;Parrafin;DME and/or AE instruction;Therapist, nutritional;Therapeutic activities;Patient/family education;Cognitive remediation/compensation;Manual Therapy;Splinting;Neuromuscular education;Fluidtherapy;Cryotherapy;Moist Heat;Energy conservation;Passive range of motion;Therapeutic exercises   Plan check remaining goals and d/c, recommend/schedule screen in approx 6-18month   OT Home Exercise Plan Education Provided:  coordination HEP, Using large amplitude movements for ADLs, PWR! hands   Consulted and Agree with Plan of Care Patient   Family Member Consulted wife      Patient will benefit from skilled therapeutic intervention in order to improve the following deficits and impairments:  Decreased coordination, Decreased knowledge of use of DME, Impaired UE functional use, Improper spinal/pelvic alignment, Impaired tone, Impaired perceived functional ability, Decreased balance, Impaired sensation  Visit Diagnosis: Other symptoms and signs involving the nervous system  Other symptoms and signs involving the musculoskeletal system  Other lack of coordination  Posture abnormality  Unsteadiness on  feet    Problem List Patient Active Problem List   Diagnosis Date Noted  . Paroxysmal atrial fibrillation ([place] 02/23/2015  . Chronotropic incompetence with sinus node dysfunction (HCC)   . Pacemaker - [employer] number [id number]/13/2015  . SSS (sick sinus syndrome) ([place] 12/20/2013  . Chronotropic incompetence with autonomic dysfunction 12/20/2013  . Diabetic peripheral neuropathy ([place] 06/17/2013  . Transaminasemia 06/17/2013  . Paralysis agitans ([place] 06/17/2013  . Parkinson's disease ([place] 06/17/2013  . NSVT (nonsustained ventricular tachycardia) ([place] 12/18/2012  . Bradycardia 11/26/2012  . Dizziness, nonspecific 11/26/2012  . Cough 01/04/2012  . CARPAL TUNNEL SYNDROME, BILATERAL 01/23/2010  . HEARING LOSS 12/18/2009  . HYPOGLYCEMIA, UNSPECIFIED 10/10/2008  . Seasonal and perennial allergic rhinitis 03/26/2008  . UNS ADVRS EFF UNS RX MEDICINAL&BIOLOGICAL SBSTNC 09/17/2007  . HYPOGONADISM, MALE 04/23/2007  . DEPRESSION, MILD 02/10/2007  . BENIGN PROSTATIC HYPERTROPHY, WITH OBSTRUCTION 02/10/2007  . OSTEOARTHRITIS 02/10/2007  . LOW BACK PAIN 02/10/2007  . HYPERGLYCEMIA, BORDERLINE 02/10/2007  . Hypothyroidism 10/14/2006  . INTERNAL HEMORRHOIDS 10/14/2006  . Dyslipidemia 09/18/2006    [facility]3/15/2018, 5:17 PM  [place]Gl[place] Golf StreetSLa UnionGCyr NAl[place] [address]Phone: [phone]  Fax:  [phone] Name: RARNEY MAYABBMRN: [id number] of Birth: 515-Sep-1937  AVianne Bulls OTR/L CThe Neurospine Center [id number] SVelardeGCarney Mars  [phone] [phone]/15/18 5:17 PM

## 2016-06-13 ENCOUNTER — Ambulatory Visit: Payer: Medicare Other | Admitting: Occupational Therapy

## 2016-06-13 ENCOUNTER — Ambulatory Visit: Payer: Medicare Other | Admitting: Speech Pathology

## 2016-06-13 DIAGNOSIS — R293 Abnormal posture: Secondary | ICD-10-CM

## 2016-06-13 DIAGNOSIS — R2689 Other abnormalities of gait and mobility: Secondary | ICD-10-CM

## 2016-06-13 DIAGNOSIS — R29898 Other symptoms and signs involving the musculoskeletal system: Secondary | ICD-10-CM

## 2016-06-13 DIAGNOSIS — R278 Other lack of coordination: Secondary | ICD-10-CM

## 2016-06-13 DIAGNOSIS — R29818 Other symptoms and signs involving the nervous system: Secondary | ICD-10-CM

## 2016-06-13 DIAGNOSIS — R2681 Unsteadiness on feet: Secondary | ICD-10-CM | POA: Diagnosis not present

## 2016-06-13 NOTE — Therapy (Signed)
[facility] [address] [place], [name], [address] Phone: [phone]   Fax:  [phone]  Occupational Therapy Treatment  Patient Details  Name: Stephen Robbins MRN: [id number] Date of Birth: [birth year] Referring Provider: Dr. Wells Guiles [name]   Encounter Date: 06/13/2016      OT End of Session - 06/13/16 1322    Visit Number 10   Number of Visits 13   Date for OT Re-Evaluation 06/15/16   Authorization Type Medicare / BCBS (G-code needed)   Authorization Time Period Renewal completed 0/71/21 (cert. date 04/18/16-06/15/16)   Authorization - Visit Number 10   Authorization - Number of Visits 10   OT Start Time 1320   OT Stop Time 1403   OT Time Calculation (min) 43 min   Activity Tolerance Patient tolerated treatment well   Behavior During Therapy WFL for tasks assessed/performed      Past Medical History:  Diagnosis Date  . Arthritis    "joints; a little" (01/04/2014)  . Asymptomatic bilateral carotid artery stenosis    per duplex  05-15-2012  left >39%/   right 40-59%  . Basal cell carcinoma    nose  . Borderline diabetes   . BPH (benign prostatic hypertrophy) with urinary obstruction   . Bradycardia   . Bronchial pneumonia 1958  . Chronotropic incompetence with sinus node dysfunction (HCC)   . Compressed cervical disc   . Compression of lumbar vertebra (HCC)    L4 -- L5  . Dizzy   . Dysmetabolic syndrome   . Fatigue   . Frequency of urination   . GERD (gastroesophageal reflux disease)    occasional  . Hemorrhoids   . Hyperlipidemia   . Hypothyroidism   . Nocturia   . NSVT (nonsustained ventricular tachycardia) [facility])    cardiologist-  dr croitoru  . Pacemaker   . Urgency of urination   . Wears glasses     Past Surgical History:  Procedure Laterality Date  . BASAL CELL CARCINOMA EXCISION     nose  . CLOSED REDUCTION NASAL FRACTURE  09-01-2007  . CYSTOSCOPY N/A 12/28/2012   Procedure: CYSTOSCOPY;   Surgeon: Bernestine Amass, MD;  Location: [facility];  Service: Urology;  Laterality: N/A;  . EXERCISE TOLERENCE TEST  12-03-2012  DR CROITURO   CHRONOTROPIC INCOMPETENCE/ NORMAL RESTING BP W/ APPROPRIATE RESPONSE/ NO CHEST PAIN/ NO ST CHANGES FROM BASELINE  . INSERT / REPLACE / REMOVE PACEMAKER  01/04/2014   [employer] model L121 serial number [id number]  . LACERATION REPAIR Right 1978   middle finger  . NEUROPLASTY / TRANSPOSITION ULNAR NERVE AT ELBOW Left 02-09-2010  . PERMANENT PACEMAKER INSERTION N/A 01/04/2014   Procedure: PERMANENT PACEMAKER INSERTION;  Surgeon: Sanda Klein, MD;  Location: [facility] CATH LAB;  Service: Cardiovascular;  Laterality: N/A;  . TONSILLECTOMY AND ADENOIDECTOMY  1944  . TRANSURETHRAL INCISION OF PROSTATE N/A 12/28/2012   Procedure: TRANSURETHRAL INCISION OF THE PROSTATE (TUIP);  Surgeon: Bernestine Amass, MD;  Location: [facility];  Service: Urology;  Laterality: N/A;  . ULNAR NERVE TRANSPOSITION      There were no vitals filed for this visit.      Subjective Assessment - 06/13/16 1321    Subjective  exercises going fairly well   Pertinent History pacemaker, L ulnar nerve transposition with L 5th decr ext, ?CTR, osteoporosis, hx of low back pain, mild depression, DM, hypothyroidism, hypogonadism.    Limitations PACEMAKER   Patient Stated Goals  slow progression   Currently in Pain? No/denies        In standing, functional diagonal step and reach with each LE/UE to grasp cylinder objects with PWR! Reach/hands and keeping feet apart with step with min cueing.  Functional mobility:  Education provided on use of diagonal step with same LE as direction change for turn during ambulation and from stationary position.  Pt verbalized understanding and returned demo with min cueing.  Checked remaining goals and discussed progress.  Picking up coins using PWR! Hands and stacking.  Then manipulating in hand to place in coin  bank using large amplitude movements.  Performed with each hand with good success.                      OT Education - 06/13/16 1702    Education Details Reviewed indications for return to OT; recommendation for PD screens in approx 6-76month; ways to prevent future complications   Person(s) Educated Patient;Spouse   Methods Explanation   Comprehension Verbalized understanding          OT Short Term Goals - 06/13/16 1352      OT SHORT TERM GOAL #1   Title Pt will be independent with PD-specific HEP.--check STGs 05/16/16   Time 4   Period Weeks   Status Achieved  04/18/16:  not fully met, min cues.  06/13/16:  met     OT SHORT TERM GOAL #2   Title Pt will verbalize understanding of ways to prevent future complications related to PD.   Time 4   Period Weeks   Status Achieved  04/18/16:  met     OT SHORT TERM GOAL #3   Title Pt will improve PPT #4 (donning/doffing jacket) by at least 3sec with use of large amplitude movement strategies to prevent future complications.   Time 4   Period Weeks   Status Achieved  10.62 secs           OT Long Term Goals - 06/13/16 1345      OT LONG TERM GOAL #1   Title Pt will verbalize understanding of strategies to incr ease/safety of ADLs/IADLs and to prevent future complications related to PD.--check LTGs 05/30/16    Time 6   Period Weeks   Status Achieved  04/18/16:  continues to need min cues due to decr awareness.  06/13/16:  met     OT LONG TERM GOAL #2   Title Pt will improve coordination for ADLs as shown by completing 9-hole peg test in 29sec or less with dominant LUE.   Baseline 32.65sec   Time 6   Period Weeks   Status Not Met  06/13/16:  33.28sec     OT LONG TERM GOAL #3   Title Pt will improve bilateral functional reaching for ADLs as shown by improving score on box and blocks test  by at least 5 bilaterally.   Baseline R-39 blocks, L-42 blocks   Time 6   Period Weeks   Status Achieved  06/13/16:  R-45blocks, L-45blocks     OT LONG TERM GOAL #4   Title Pt will improve efficiency with dressing as shown by buttoning/unbuttoning 3 buttons in 24sec or less.   Baseline 27.06sec   Time 6   Period Weeks   Status Not Met  06/06/16:  30.25 sec                Plan - 06/13/16 1353    Clinical Impression Statement Pt demo  incr awareness of PD-specific deficits and ways to prevent future complications.  Pt also verbalizes understanding of PD-specific HEP and strategies for ADLs.   Rehab Potential Good   OT Frequency --  8 visits    OT Duration 6 weeks   OT Treatment/Interventions Self-care/ADL training;Therapeutic exercise;Parrafin;DME and/or AE instruction;Therapist, nutritional;Therapeutic activities;Patient/family education;Cognitive remediation/compensation;Manual Therapy;Splinting;Neuromuscular education;Fluidtherapy;Cryotherapy;Moist Heat;Energy conservation;Passive range of motion;Therapeutic exercises   Plan d/c OT, schedule screen in approx 6-7 months   OT Home Exercise Plan Education Provided:  coordination HEP, Using large amplitude movements for ADLs, PWR! hands   Consulted and Agree with Plan of Care Patient   Family Member Consulted wife      Patient will benefit from skilled therapeutic intervention in order to improve the following deficits and impairments:  Decreased coordination, Decreased knowledge of use of DME, Impaired UE functional use, Improper spinal/pelvic alignment, Impaired tone, Impaired perceived functional ability, Decreased balance, Impaired sensation  Visit Diagnosis: Other symptoms and signs involving the nervous system  Other symptoms and signs involving the musculoskeletal system  Other lack of coordination  Posture abnormality  Unsteadiness on feet  Other abnormalities of gait and mobility      G-Codes - Jun 17, 2016 1703    Functional Assessment Tool Used (Outpatient only) 9-hole peg test:  L-33.28sec;  Box and blocks test:  R-45,  L-45 blocks; PPT #4 in 10.62sec, fastening/unfastening 3 buttons in 30.25sec   Functional Limitation Carrying, moving and handling objects   Carrying, Moving and Handling Objects Goal Status (Z2248) At least 1 percent but less than 20 percent impaired, limited or restricted   Carrying, Moving and Handling Objects Discharge Status [phone]) At least 1 percent but less than 20 percent impaired, limited or restricted      Problem List Patient Active Problem List   Diagnosis Date Noted  . Paroxysmal atrial fibrillation ([place]) 02/23/2015  . Chronotropic incompetence with sinus node dysfunction (HCC)   . Pacemaker - [place] serial number [id number] 01/04/2014  . SSS (sick sinus syndrome) ([place]) 12/20/2013  . Chronotropic incompetence with autonomic dysfunction 12/20/2013  . Diabetic peripheral neuropathy ([place]) 06/17/2013  . Transaminasemia 06/17/2013  . Paralysis agitans ([place]) 06/17/2013  . Parkinson's disease ([place]) 06/17/2013  . NSVT (nonsustained ventricular tachycardia) ([place]) 12/18/2012  . Bradycardia 11/26/2012  . Dizziness, nonspecific 11/26/2012  . Cough 01/04/2012  . CARPAL TUNNEL SYNDROME, BILATERAL 01/23/2010  . HEARING LOSS 12/18/2009  . HYPOGLYCEMIA, UNSPECIFIED 10/10/2008  . Seasonal and perennial allergic rhinitis 03/26/2008  . UNS ADVRS EFF UNS RX MEDICINAL&BIOLOGICAL SBSTNC 09/17/2007  . HYPOGONADISM, MALE 04/23/2007  . DEPRESSION, MILD 02/10/2007  . BENIGN PROSTATIC HYPERTROPHY, WITH OBSTRUCTION 02/10/2007  . OSTEOARTHRITIS 02/10/2007  . LOW BACK PAIN 02/10/2007  . HYPERGLYCEMIA, BORDERLINE 02/10/2007  . Hypothyroidism 10/14/2006  . INTERNAL HEMORRHOIDS 10/14/2006  . Dyslipidemia 09/18/2006   OCCUPATIONAL THERAPY DISCHARGE SUMMARY  Visits from Start of Care: 10  Current functional level related to goals / functional outcomes: See above   Remaining deficits: Bradykinesia, rigidity, decr coordination, decr functional mobility/balance,  abnormal posture, decr awareness of deficits   Education / Equipment: Pt was instructed in the following:  PD-specific HEP, adaptive strategies for ADLs/IADLs, ways to prevent future complications, appropriate community resources.  Pt verbalized understanding of all education provided.    Plan: Patient agrees to discharge.  Patient goals were partially met. Patient is being discharged due to being pleased with the current functional level.  And reaching maximal rehab potential at this time.  Pt would benefit from  re-evaluation/occupational therapy screen in approx 6-7 months to assess for need for further therapy/functional changes due to progressive nature of diagnosis. ?????        [facility] 06/13/2016, 5:05 PM  [facility] [address] [place], [name], [address] Phone: [phone]   Fax:  [phone]  Name: Stephen Robbins MRN: [id number] Date of Birth: 1935/09/21   Vianne Bulls, OTR/L [facility] [place] [address] [place], [place]  [address] [phone] phone [phone] 06/13/16 5:05 PM

## 2016-06-14 ENCOUNTER — Other Ambulatory Visit: Payer: Self-pay | Admitting: Internal Medicine

## 2016-06-20 ENCOUNTER — Ambulatory Visit: Payer: Medicare Other | Admitting: Speech Pathology

## 2016-07-08 ENCOUNTER — Encounter (HOSPITAL_BASED_OUTPATIENT_CLINIC_OR_DEPARTMENT_OTHER): Payer: Self-pay

## 2016-07-08 ENCOUNTER — Ambulatory Visit: Payer: Medicare Other | Admitting: Family Medicine

## 2016-07-08 ENCOUNTER — Emergency Department (HOSPITAL_BASED_OUTPATIENT_CLINIC_OR_DEPARTMENT_OTHER)
Admission: EM | Admit: 2016-07-08 | Discharge: 2016-07-08 | Disposition: A | Discharge status: Home / Self Care | Payer: Medicare Other | Attending: Emergency Medicine | Admitting: Emergency Medicine

## 2016-07-08 DIAGNOSIS — K5909 Other constipation: Secondary | ICD-10-CM | POA: Diagnosis not present

## 2016-07-08 DIAGNOSIS — E039 Hypothyroidism, unspecified: Secondary | ICD-10-CM | POA: Diagnosis not present

## 2016-07-08 DIAGNOSIS — Z87891 Personal history of nicotine dependence: Secondary | ICD-10-CM | POA: Insufficient documentation

## 2016-07-08 DIAGNOSIS — K5900 Constipation, unspecified: Secondary | ICD-10-CM | POA: Diagnosis present

## 2016-07-08 MED ORDER — LIDOCAINE HCL 2 % EX GEL
1.0000 "application " | Freq: Once | CUTANEOUS | Status: AC
  Administered 2016-07-08: 1 via TOPICAL
  Filled 2016-07-08: qty 20

## 2016-07-08 MED ORDER — HYDROCORTISONE ACE-PRAMOXINE 2.5-1 % EX CREA: TOPICAL_CREAM | CUTANEOUS | 0 refills | Status: DC

## 2016-07-08 MED FILL — HYDROCORT-PRAMOXINE 2.5-1%: 2.5-1 | 15 days supply | Qty: 30 | Fill #0

## 2016-07-08 NOTE — Discharge Instructions (Signed)
You can take miralax, available over the counter.  Use one cap full in an 8ounce beverage of your choice daily for the next week.

## 2016-07-08 NOTE — ED Notes (Signed)
SSE administered; pt tolerated about 1/2 of enema; currently on [facility] to see if enema successful.

## 2016-07-08 NOTE — ED Notes (Signed)
Pt is using the commode, will update vital after he is done.

## 2016-07-08 NOTE — Progress Notes (Deleted)
   Subjective:    Patient ID: Stephen Robbins, male    DOB: March 10, 1936, 81 y.o.   MRN: [id number]  HPI  Stephen Robbins is a 81 year old male who presents today with a concern for a hemorrhoid that he noticed        .   History of paroxysmal atrial fibrillation and on chronic anticoagulation therapy where he is followed by cardiology.  Also history of BPH where he is followed by urology Associated symptoms of  Treatment at home with  Denies    Review of Systems     Objective:   Physical Exam        Assessment & Plan:  1. Hemorrhoids, unspecified hemorrhoid type

## 2016-07-08 NOTE — ED Notes (Signed)
Lg amt soft brown stool noted in [facility]; pt reports relief.

## 2016-07-08 NOTE — ED Triage Notes (Signed)
c/o hemorrhoids started yesterday-constipation x 4 days-NAD-slow steady gait

## 2016-07-15 NOTE — ED Provider Notes (Signed)
Stephen Robbins Provider Note   CSN: [id number] Arrival date & time: 07/08/16  1132     History   Chief Complaint Chief Complaint  Patient presents with  . Hemorrhoids    HPI Stephen Robbins is a 81 y.o. male.  The history is provided by the patient. No language interpreter was used.    Stephen Robbins is a 81 y.o. male who presents to the Emergency Department complaining of constipation.  He reports 4 days of constipation with discomfort is related to hemorrhoids. He has a history of similar symptoms in the past and has tried a cream that with ongoing pain in his rectum. No reports of fevers, nausea, vomiting, abdominal pain, difficulty with urination. Symptoms are mild and constant nature.  Past Medical History:  Diagnosis Date  . Arthritis    "joints; a little" (01/04/2014)  . Asymptomatic bilateral carotid artery stenosis    per duplex  05-15-2012  left >39%/   right 40-59%  . Basal cell carcinoma    nose  . Borderline diabetes   . BPH (benign prostatic hypertrophy) with urinary obstruction   . Bradycardia   . Bronchial pneumonia 1958  . Chronotropic incompetence with sinus node dysfunction (HCC)   . Compressed cervical disc   . Compression of lumbar vertebra (HCC)    L4 -- L5  . Dizzy   . Dysmetabolic syndrome   . Fatigue   . Frequency of urination   . GERD (gastroesophageal reflux disease)    occasional  . Hemorrhoids   . Hyperlipidemia   . Hypothyroidism   . Nocturia   . NSVT (nonsustained ventricular tachycardia) [facility])    cardiologist-  dr croitoru  . Pacemaker   . Urgency of urination   . Wears glasses     Patient Active Problem List   Diagnosis Date Noted  . Paroxysmal atrial fibrillation ([place]) 02/23/2015  . Chronotropic incompetence with sinus node dysfunction (HCC)   . Pacemaker - [place] serial number [id number] 01/04/2014  . SSS (sick sinus syndrome) ([place]) 12/20/2013  . Chronotropic incompetence with autonomic  dysfunction 12/20/2013  . Diabetic peripheral neuropathy ([place]) 06/17/2013  . Transaminasemia 06/17/2013  . Paralysis agitans ([place]) 06/17/2013  . Parkinson's disease ([place]) 06/17/2013  . NSVT (nonsustained ventricular tachycardia) ([place]) 12/18/2012  . Bradycardia 11/26/2012  . Dizziness, nonspecific 11/26/2012  . Cough 01/04/2012  . CARPAL TUNNEL SYNDROME, BILATERAL 01/23/2010  . HEARING LOSS 12/18/2009  . HYPOGLYCEMIA, UNSPECIFIED 10/10/2008  . Seasonal and perennial allergic rhinitis 03/26/2008  . UNS ADVRS EFF UNS RX MEDICINAL&BIOLOGICAL SBSTNC 09/17/2007  . HYPOGONADISM, MALE 04/23/2007  . DEPRESSION, MILD 02/10/2007  . BENIGN PROSTATIC HYPERTROPHY, WITH OBSTRUCTION 02/10/2007  . OSTEOARTHRITIS 02/10/2007  . LOW BACK PAIN 02/10/2007  . HYPERGLYCEMIA, BORDERLINE 02/10/2007  . Hypothyroidism 10/14/2006  . INTERNAL HEMORRHOIDS 10/14/2006  . Dyslipidemia 09/18/2006    Past Surgical History:  Procedure Laterality Date  . BASAL CELL CARCINOMA EXCISION     nose  . CLOSED REDUCTION NASAL FRACTURE  09-01-2007  . CYSTOSCOPY N/A 12/28/2012   Procedure: CYSTOSCOPY;  Surgeon: Bernestine Amass, MD;  Location: [facility];  Service: Urology;  Laterality: N/A;  . EXERCISE TOLERENCE TEST  12-03-2012  DR CROITURO   CHRONOTROPIC INCOMPETENCE/ NORMAL RESTING BP W/ APPROPRIATE RESPONSE/ NO CHEST PAIN/ NO ST CHANGES FROM BASELINE  . INSERT / REPLACE / REMOVE PACEMAKER  01/04/2014   [employer] model L121 serial number [id number]  . LACERATION REPAIR Right 1978  middle finger  . NEUROPLASTY / TRANSPOSITION ULNAR NERVE AT ELBOW Left 02-09-2010  . PERMANENT PACEMAKER INSERTION N/A 01/04/2014   Procedure: PERMANENT PACEMAKER INSERTION;  Surgeon: Sanda Klein, MD;  Location: [facility];  Service: Cardiovascular;  Laterality: N/A;  . TONSILLECTOMY AND ADENOIDECTOMY  1944  . TRANSURETHRAL INCISION OF PROSTATE N/A 12/28/2012   Procedure: TRANSURETHRAL INCISION OF THE PROSTATE  (TUIP);  Surgeon: Bernestine Amass, MD;  Location[facility];  Service: Urology;  Laterality: N/A;  . ULNAR NERVE TRANSPOSITION         Home Medications    Prior to Admission medications   Medication Sig Start Date End Date Taking? Authorizing Provider  ALPRAZolam (XANAX) 0.25 MG tablet Take 1 tablet (0.25 mg total) by mouth 2 (two) times daily as needed. Patient taking differently: Take 0.25 mg by mouth daily.  06/16/14   Marletta Lor, MD  B Complex Vitamins (B COMPLEX 50) TABS Take 1 tablet by mouth every morning.     Historical Provider, MD  canagliflozin (INVOKANA) 300 MG TABS tablet Take 1 tablet (300 mg total) by mouth daily before breakfast. 03/11/16   Marletta Lor, MD  carbidopa-levodopa (SINEMET IR) 25-100 MG tablet Take 1.5 tablets by mouth 3 (three) times daily before meals. 12/26/15   [name] [name], DO  dapagliflozin propanediol (FARXIGA) 10 MG TABS tablet Take 10 mg by mouth daily. 03/13/16   Marletta Lor, MD  fish oil-omega-3 fatty acids 1000 MG capsule Take 2 g by mouth daily.    Historical Provider, MD  gabapentin (NEURONTIN) 300 MG capsule Take 1 capsule (300 mg total) by mouth at bedtime. 12/26/15   [name] [name], DO  hydrocortisone 0.5 % cream Apply 1 application topically 2 (two) times daily as needed. rash    Historical Provider, MD  ibuprofen (ADVIL,MOTRIN) 200 MG tablet Take 200 mg by mouth daily as needed for mild pain.     Historical Provider, MD  levothyroxine (SYNTHROID, LEVOTHROID) 50 MCG tablet take 1 tablet by mouth once daily BEFORE BREAKFAST 03/15/16   Marletta Lor, MD  mirabegron ER (MYRBETRIQ) 25 MG TB24 tablet Take 25 mg by mouth daily.    Historical Provider, MD  Polyethyl Glycol-Propyl Glycol (SYSTANE ULTRA) 0.4-0.3 % SOLN Place 1 drop into both eyes daily as needed (dry eyes).     Historical Provider, MD  Pramoxine-HC (HYDROCORTISONE ACE-PRAMOXINE) 2.5-1 % CREA Apply rectally twice daily as needed 07/08/16    Quintella Reichert, MD  sertraline (ZOLOFT) 25 MG tablet Take 25 mg by mouth every morning. Reported on 06/02/2015    Historical Provider, MD  sertraline (ZOLOFT) 25 MG tablet take 1 tablet by mouth once daily 06/17/16   Marletta Lor, MD  simvastatin (ZOCOR) 20 MG tablet take 1 tablet by mouth at bedtime 12/05/15   Marletta Lor, MD  tamsulosin [facility]) 0.4 MG CAPS capsule take 1 capsule by mouth every morning 06/17/16   Marletta Lor, MD  vitamin B-12 (CYANOCOBALAMIN) 1000 MCG tablet Take 1,000 mcg by mouth every morning.     Historical Provider, MD  XARELTO 20 MG TABS tablet take 1 tablet by mouth once daily WITH SUPPER 03/15/16   Sanda Klein, MD    Family History Family History  Problem Relation Age of Onset  . Stroke Father   . Lung cancer Mother     Social History Social History  Substance Use Topics  . Smoking status: Former Smoker    Packs/day: 1.00  Years: 10.00    Types: Cigarettes    Quit date: 03/25/1968  . Smokeless tobacco: Never Used  . Alcohol use 4.2 oz/week    7 Glasses of wine per week     Comment: 1 glass wine daily     Allergies   Penicillins   Review of Systems Review of Systems  All other systems reviewed and are negative.    Physical Exam Updated Vital Signs BP 119/68 (BP Location: Right Arm)   Pulse 76   Temp 98.5 F (36.9 C) (Oral)   Resp 18   Ht 5\' 9"  (1.753 m)   Wt 170 lb (77.1 kg)   SpO2 98%   BMI 25.10 kg/m   Physical Exam  Constitutional: He is oriented to person, place, and time. He appears well-developed and well-nourished.  HENT:  Head: Normocephalic and atraumatic.  Cardiovascular: Normal rate and regular rhythm.   No murmur heard. Pulmonary/Chest: Effort normal and breath sounds normal. No respiratory distress.  Abdominal: Soft. There is no tenderness. There is no rebound and no guarding.  Genitourinary:  Genitourinary Comments: Small nonthrombosed external hemorrhoid  Musculoskeletal: He exhibits no  edema or tenderness.  Neurological: He is alert and oriented to person, place, and time.  Skin: Skin is warm and dry.  Psychiatric: He has a normal mood and affect. His behavior is normal.  Nursing note and vitals reviewed.    ED Treatments / Results  Labs (all labs ordered are listed, but only abnormal results are displayed) Labs Reviewed - No data to display  EKG  EKG Interpretation None       Radiology No results found.  Procedures Procedures (including critical care time)  Medications Ordered in ED Medications  lidocaine (XYLOCAINE) 2 % jelly 1 application (1 application Topical Given 07/08/16 1348)     Initial Impression / Assessment and Plan / ED Course  I have reviewed the triage vital signs and the nursing notes.  Pertinent labs & imaging results that were available during my care of the patient were reviewed by me and considered in my medical decision making (see chart for details).     Patient here for evaluation of constipation and hemorrhoid discomfort. He is nontoxic appearing on examination with soft and nontender abdomen. He was treated with an enema in the emergency department with relief of the symptoms. On repeat evaluation he feels significantly improved. Consultation on homecare for constipation as well as hemorrhoids. Discussed outpatient follow up and return precautions. Presentation is not consistent with acute abdomen, bowel obstruction.  Final Clinical Impressions(s) / ED Diagnoses   Final diagnoses:  Other constipation    New Prescriptions Discharge Medication List as of 07/08/2016  3:07 PM    START taking these medications   Details  Pramoxine-HC (HYDROCORTISONE ACE-PRAMOXINE) 2.5-1 % CREA Apply rectally twice daily as needed, Print         Quintella Reichert, MD 07/15/16 1452

## 2016-07-16 ENCOUNTER — Encounter: Payer: Self-pay | Admitting: Cardiovascular Disease

## 2016-07-16 ENCOUNTER — Ambulatory Visit (INDEPENDENT_AMBULATORY_CARE_PROVIDER_SITE_OTHER): Payer: Medicare Other | Admitting: Cardiovascular Disease

## 2016-07-16 VITALS — BP 98/63 | HR 78 | Ht 69.0 in | Wt 178.6 lb

## 2016-07-16 DIAGNOSIS — I472 Ventricular tachycardia: Secondary | ICD-10-CM | POA: Diagnosis not present

## 2016-07-16 DIAGNOSIS — I480 Paroxysmal atrial fibrillation: Secondary | ICD-10-CM

## 2016-07-16 DIAGNOSIS — Z950 Presence of cardiac pacemaker: Secondary | ICD-10-CM | POA: Diagnosis not present

## 2016-07-16 DIAGNOSIS — I495 Sick sinus syndrome: Secondary | ICD-10-CM | POA: Diagnosis not present

## 2016-07-16 DIAGNOSIS — R001 Bradycardia, unspecified: Secondary | ICD-10-CM

## 2016-07-16 DIAGNOSIS — I951 Orthostatic hypotension: Secondary | ICD-10-CM

## 2016-07-16 DIAGNOSIS — I4729 Other ventricular tachycardia: Secondary | ICD-10-CM

## 2016-07-16 NOTE — Progress Notes (Signed)
Patient ID: Stephen Robbins, male   DOB: October 22, 1935, 81 y.o.   MRN: [id number]    Cardiology Office Note    Date:  07/16/2016   ID:  Stephen Robbins, DOB July 20, 1935, MRN [id number]  PCP:  Nyoka Cowden, MD  Cardiologist:   Sanda Klein, MD   Chief Complaint  Patient presents with  . Follow-up    History of Present Illness:  Stephen Robbins is a 81 y.o. male with asymptomatic paroxysmal atrial fibrillation detected on pacemaker downloads, sinus node dysfunction with chronotropic incompetence, Parkinson's disease, diabetes mellitus, asymptomatic brief nonsustained VT detected on pacemaker.  He has generally done well since his last appointment, although he is now less active due to problems with Parkinson's and increased urination. He has orthostatic dizziness. He is taking an SGLT2 inhibitor for diabetes.  His device is a [employer] implanted in 2015. Device check today shows normal function. Estimated generator longevity is 13 years. There is 43% atrial pacing and only 2% ventricular pacing. Heart rate histogram distribution suggests appropriate settings to compensate for chronotropic incompetence.  Since his last appointment he has had another episode of atrial fibrillation on 06/02/2016. Total burden of atrial fibrillation since last device check is about 10 hours. Ventricular rate control is acceptable at around 100-110. He was unaware of the arrhythmia. The overall burden of atrial fibrillation remains less than 1%. No new episodes of nonsustained VT are seen.  He continues to have complaints of orthostatic dizziness that is generally mild. He denies angina, dyspnea, palpitations, syncope. He has not had any bleeding problems and is compliant with anticoagulation.    Past Medical History:  Diagnosis Date  . Arthritis    "joints; a little" (01/04/2014)  . Asymptomatic bilateral carotid artery stenosis    per duplex  05-15-2012  left >39%/   right  40-59%  . Basal cell carcinoma    nose  . Borderline diabetes   . BPH (benign prostatic hypertrophy) with urinary obstruction   . Bradycardia   . Bronchial pneumonia 1958  . Chronotropic incompetence with sinus node dysfunction (HCC)   . Compressed cervical disc   . Compression of lumbar vertebra (HCC)    L4 -- L5  . Dizzy   . Dysmetabolic syndrome   . Fatigue   . Frequency of urination   . GERD (gastroesophageal reflux disease)    occasional  . Hemorrhoids   . Hyperlipidemia   . Hypothyroidism   . Nocturia   . NSVT (nonsustained ventricular tachycardia) [facility])    cardiologist-  dr Marvin Grabill  . Pacemaker   . Urgency of urination   . Wears glasses     Past Surgical History:  Procedure Laterality Date  . BASAL CELL CARCINOMA EXCISION     nose  . CLOSED REDUCTION NASAL FRACTURE  09-01-2007  . CYSTOSCOPY N/A 12/28/2012   Procedure: CYSTOSCOPY;  Surgeon: Bernestine Amass, MD;  Location: [facility];  Service: Urology;  Laterality: N/A;  . EXERCISE TOLERENCE TEST  12-03-2012  DR CROITURO   CHRONOTROPIC INCOMPETENCE/ NORMAL RESTING BP W/ APPROPRIATE RESPONSE/ NO CHEST PAIN/ NO ST CHANGES FROM BASELINE  . INSERT / REPLACE / REMOVE PACEMAKER  01/04/2014   [employer] model L121 serial number [id number]  . LACERATION REPAIR Right 1978   middle finger  . NEUROPLASTY / TRANSPOSITION ULNAR NERVE AT ELBOW Left 02-09-2010  . PERMANENT PACEMAKER INSERTION N/A 01/04/2014   Procedure: PERMANENT PACEMAKER INSERTION;  Surgeon: Sanda Klein, MD;  Location:  [facility];  Service: Cardiovascular;  Laterality: N/A;  . TONSILLECTOMY AND ADENOIDECTOMY  1944  . TRANSURETHRAL INCISION OF PROSTATE N/A 12/28/2012   Procedure: TRANSURETHRAL INCISION OF THE PROSTATE (TUIP);  Surgeon: Bernestine Amass, MD;  Location: [facility];  Service: Urology;  Laterality: N/A;  . ULNAR NERVE TRANSPOSITION      Current Medications: Outpatient Medications Prior to Visit    Medication Sig Dispense Refill  . ALPRAZolam (XANAX) 0.25 MG tablet Take 1 tablet (0.25 mg total) by mouth 2 (two) times daily as needed. (Patient taking differently: Take 0.25 mg by mouth daily. ) 30 tablet 2  . B Complex Vitamins (B COMPLEX 50) TABS Take 1 tablet by mouth every morning.     . canagliflozin (INVOKANA) 300 MG TABS tablet Take 1 tablet (300 mg total) by mouth daily before breakfast. 30 tablet 6  . carbidopa-levodopa (SINEMET IR) 25-100 MG tablet Take 1.5 tablets by mouth 3 (three) times daily before meals. 405 tablet 1  . dapagliflozin propanediol (FARXIGA) 10 MG TABS tablet Take 10 mg by mouth daily. 90 tablet 1  . fish oil-omega-3 fatty acids 1000 MG capsule Take 2 g by mouth daily.    Marland Kitchen gabapentin (NEURONTIN) 300 MG capsule Take 1 capsule (300 mg total) by mouth at bedtime. 90 capsule 1  . hydrocortisone 0.5 % cream Apply 1 application topically 2 (two) times daily as needed. rash    . ibuprofen (ADVIL,MOTRIN) 200 MG tablet Take 200 mg by mouth daily as needed for mild pain.     Marland Kitchen levothyroxine (SYNTHROID, LEVOTHROID) 50 MCG tablet take 1 tablet by mouth once daily BEFORE BREAKFAST 90 tablet 3  . mirabegron ER (MYRBETRIQ) 25 MG TB24 tablet Take 25 mg by mouth daily.    Vladimir Faster Glycol-Propyl Glycol (SYSTANE ULTRA) 0.4-0.3 % SOLN Place 1 drop into both eyes daily as needed (dry eyes).     . Pramoxine-HC (HYDROCORTISONE ACE-PRAMOXINE) 2.5-1 % CREA Apply rectally twice daily as needed 28.35 g 0  . sertraline (ZOLOFT) 25 MG tablet Take 25 mg by mouth every morning. Reported on 06/02/2015    . sertraline (ZOLOFT) 25 MG tablet take 1 tablet by mouth once daily 90 tablet 1  . simvastatin (ZOCOR) 20 MG tablet take 1 tablet by mouth at bedtime 90 tablet 3  . tamsulosin (FLOMAX) 0.4 MG CAPS capsule take 1 capsule by mouth every morning 90 capsule 1  . vitamin B-12 (CYANOCOBALAMIN) 1000 MCG tablet Take 1,000 mcg by mouth every morning.     Alveda Reasons 20 MG TABS tablet take 1 tablet by  mouth once daily WITH SUPPER 90 tablet 1   No facility-administered medications prior to visit.      Allergies:   Penicillins   Social History   Social History  . Marital status: Married    Spouse name: N/A  . Number of children: 0  . Years of education: N/A   Occupational History  . retired Retired    Programme researcher, broadcasting/film/video    Social History Main Topics  . Smoking status: Former Smoker    Packs/day: 1.00    Years: 10.00    Types: Cigarettes    Quit date: 03/25/1968  . Smokeless tobacco: Never Used  . Alcohol use 4.2 oz/week    7 Glasses of wine per week     Comment: 1 glass wine daily  . Drug use: No  . Sexual activity: Not Asked   Other Topics Concern  . None   Social  History Narrative   Regular exercise           Family History:  The patient's family history includes Lung cancer in his mother; Stroke in his father.   ROS:   Please see the history of present illness.    ROS All other systems reviewed and are negative.   PHYSICAL EXAM:   VS:  BP 98/63 (BP Location: Left Arm, Patient Position: Sitting, Cuff Size: Normal)   Pulse 78   Ht 5\' 9"  (1.753 m)   Wt 81 kg (178 lb 9.6 oz)   BMI 26.37 kg/m    GEN: Well nourished, well developed, in no acute distress  HEENT: normal  Neck: no JVD, carotid bruits, or masses Cardiac: RRR; no murmurs, rubs, or gallops,no edema , healthy pacemaker site Respiratory:  clear to auscultation bilaterally, normal work of breathing GI: soft, nontender, nondistended, + BS MS: no deformity or atrophy  Skin: warm and dry, no rash Neuro:  Alert and Oriented x 3, Strength and sensation are intact. Mild resting tremor Psych: euthymic mood, full affect  Wt Readings from Last 3 Encounters:  07/16/16 81 kg (178 lb 9.6 oz)  07/08/16 77.1 kg (170 lb)  04/29/16 79.4 kg (175 lb)      Studies/Labs Reviewed:   EKG:  EKG is ordered today. It shows atrial paced, ventricular sensed rhythm with AV delay of 210 ms. The native QRS shows a nonspecific  intraventricular conduction delay most closely resembling left anterior fascicular block. The QRS duration is 112 ms. QTC 440 ms.   Recent Labs: 01/31/2016: ALT 8; BUN 17; Creatinine, Ser 0.85; Hemoglobin 17.0; Platelets 165.0; Potassium 4.2; Sodium 137; TSH 4.49   Lipid Panel    Component Value Date/Time   CHOL 132 01/31/2016 0914   TRIG 111.0 01/31/2016 0914   TRIG 83 02/20/2006 0838   HDL 42.40 01/31/2016 0914   CHOLHDL 3 01/31/2016 0914   VLDL 22.2 01/31/2016 0914   LDLCALC 67 01/31/2016 0914     ASSESSMENT:    1. Bradycardia   2. NSVT (nonsustained ventricular tachycardia) ([place])   3. Pacemaker   4. SSS (sick sinus syndrome) (HCC)      PLAN:  In order of problems listed above:  1. Atrial fibrillation: Is asymptomatic, Acceptably rate controlled, infrequent. On appropriate anticoagulation secondary to increased embolic risk. CHADSVasc score 3 (age, DM). I would not place on additional rate control medications due to the risk of worsening orthostatic hypotension, especially since the tachycardia is minimal and the overall burden of arrhythmia is very low. 2. SSS: After multiple adjustments and sensor settings we have achieved appropriate programming to alleviate symptoms of chronotropic incompetence.  3. NSVT: No new events since last device check. No treatment indicated at this time. 4. PPM: Remote download every 3 months and yearly office follow-up 5. Orthostatic hypotension: Related to age and Parkinson's disease, probably worsened by carbidopa/levodopa and possibly also relative hypovolemia due to treatment with SGLT2 inhibitors and forced glucosuria. Flomax is also possibly responsible. He should stay well-hydrated. Encouraged him to discuss use of alternative treatment for his diabetes.   Medication Adjustments/Labs and Tests Ordered: Current medicines are reviewed at length with the patient today.  Concerns regarding medicines are outlined above.  Medication changes,  Labs and Tests ordered today are listed in the Patient Instructions below. Patient Instructions  Dr Sallyanne Kuster recommends that you continue on your current medications as directed. Please refer to the Current Medication list given to you today.  Remote monitoring is  used to monitor your Pacemaker or ICD from home. This monitoring reduces the number of office visits required to check your device to one time per year. It allows [place] to keep an eye on the functioning of your device to ensure it is working properly. You are scheduled for a device check from home on Tuesday, July 24th, 2018. You may send your transmission at any time that day. If you have a wireless device, the transmission will be sent automatically. After your physician reviews your transmission, you will receive a notification with your next transmission date.  Dr Sallyanne Kuster recommends that you schedule a follow-up appointment in 12 months with a pacemaker check. You will receive a reminder letter in the mail two months in advance. If you don't receive a letter, please call our office to schedule the follow-up appointment.  If you need a refill on your cardiac medications before your next appointment, please call your pharmacy.    Signed, Sanda Klein, MD  07/16/2016 3:18 PM    [address], [place],   [address] Phone: [phone]; Fax: [phone]

## 2016-07-16 NOTE — Patient Instructions (Signed)

## 2016-07-17 LAB — CUP PACEART INCLINIC DEVICE CHECK
Battery Remaining Longevity: 156 mo
Battery Remaining Percentage: 100 %
Brady Statistic RA Percent Paced: 43 %
Brady Statistic RV Percent Paced: 2 %
Date Time Interrogation Session: 20180424184200
Implantable Lead Implant Date: 20151013
Implantable Lead Implant Date: 20151013
Implantable Lead Location: 753859
Implantable Lead Location: 753860
Implantable Lead Model: 4135
Implantable Lead Model: 4136
Implantable Lead Serial Number: 29480373
Implantable Lead Serial Number: 29608302
Implantable Pulse Generator Implant Date: 20151013
Lead Channel Impedance Value: 545 Ohm
Lead Channel Impedance Value: 652 Ohm
Lead Channel Setting Pacing Amplitude: 2 V
Lead Channel Setting Pacing Amplitude: 4 V
Lead Channel Setting Pacing Pulse Width: 1 ms
Lead Channel Setting Sensing Sensitivity: 2.5 mV
Pulse Gen Serial Number: 702951

## 2016-07-26 NOTE — Therapy (Signed)
[facility] [address] [place], [name], [address] Phone: [phone]   Fax:  [phone]  Patient Details  Name: Stephen Robbins MRN: [id number] Date of Birth: 1935/07/27 Referring Provider:  No ref. provider found  Encounter Date: 07/26/2016  SPEECH THERAPY DISCHARGE SUMMARY  Visits from Start of Care: 9  Current functional level related to goals / functional outcomes: Pt made some gains with achieving louder voice in this course of therapy. He was pleased with current progress after 9 sessions and chose not to schedule more sessions. His goals at that 9th session were as follows:  SLP Short Term Goals - 05/30/16 1430              SLP SHORT TERM GOAL #1    Title pt will produce loud /a/ at average 85dB at 50cm over three consecutive therapy sessions    Baseline --    Time --    Period --    Status Achieved         SLP SHORT TERM GOAL #2    Title pt will average 70dB at 50 cm in 18/20 sentence responses over three consecutive sessions with rare verbal cues    Status Achieved         SLP SHORT TERM GOAL #3    Title pt will demo 10 minutes simple conversation with average 70dB over three sessions    Status Achieved         SLP SHORT TERM GOAL #4    Title pt will demo conversational volume of average 70dB at 50 cm over 15 minutes mod-complex/complex conversation over three sessions    Baseline 05-30-16    Time 4    Period Weeks    Status On-going                       SLP Long Term Goals - 06/06/16 1705              SLP LONG TERM GOAL #1    Title pt will maintain average 85 dB loud /a/ at 50 cm with abdominal breathing and proper voicing over 5 sessions    Baseline 05/30/16, 06-06-16    Time 6    Period Weeks    Status On-going         SLP LONG TERM GOAL #2    Title Pt will converse audibly in noisy environment/outside of therapy room over 15 minutes with supervision cues.    Time --    Period --   Status Achieved      Remaining deficits: Dysarthria, intermittent.    Education / Equipment: Need to implement loud /a/ into daily routine, need to speak louder to be heard consistently.  Plan: Patient agrees to discharge.  Patient goals were partially met. Patient is being discharged due to being pleased with the current functional level.  ?????       [place] ,[place], CCC-SLP  07/26/2016, 9:32 AM  [facility] [address] [place], [name], [address] Phone: [phone]   Fax:  [phone]

## 2016-07-30 ENCOUNTER — Ambulatory Visit: Payer: Medicare Other | Admitting: Internal Medicine

## 2016-07-30 ENCOUNTER — Other Ambulatory Visit: Payer: Self-pay | Admitting: Neurology

## 2016-08-02 ENCOUNTER — Encounter: Payer: Self-pay | Admitting: Neurology

## 2016-08-02 MED ORDER — GABAPENTIN 300 MG PO CAPS: 300.0000 mg | ORAL_CAPSULE | Freq: Two times a day (BID) | ORAL | 1 refills | Status: DC

## 2016-08-06 ENCOUNTER — Other Ambulatory Visit: Payer: Self-pay | Admitting: Neurology

## 2016-08-06 ENCOUNTER — Ambulatory Visit (INDEPENDENT_AMBULATORY_CARE_PROVIDER_SITE_OTHER): Payer: Medicare Other | Admitting: Internal Medicine

## 2016-08-06 ENCOUNTER — Encounter: Payer: Self-pay | Admitting: Internal Medicine

## 2016-08-06 VITALS — BP 100/62 | HR 67 | Temp 98.3°F | Ht 69.0 in | Wt 175.8 lb

## 2016-08-06 DIAGNOSIS — E1142 Type 2 diabetes mellitus with diabetic polyneuropathy: Secondary | ICD-10-CM

## 2016-08-06 DIAGNOSIS — E039 Hypothyroidism, unspecified: Secondary | ICD-10-CM

## 2016-08-06 DIAGNOSIS — G20 Parkinson's disease: Secondary | ICD-10-CM | POA: Diagnosis not present

## 2016-08-06 DIAGNOSIS — I480 Paroxysmal atrial fibrillation: Secondary | ICD-10-CM

## 2016-08-06 LAB — POCT GLYCOSYLATED HEMOGLOBIN (HGB A1C): Hemoglobin A1C: 7.3

## 2016-08-06 MED ORDER — METFORMIN HCL ER 500 MG PO TB24: 1000.0000 mg | ORAL_TABLET | Freq: Every day | ORAL | 5 refills | Status: DC

## 2016-08-06 MED ORDER — SERTRALINE HCL 25 MG PO TABS: 25.0000 mg | ORAL_TABLET | Freq: Every morning | ORAL | 5 refills | Status: DC

## 2016-08-06 NOTE — Progress Notes (Signed)
Subjective:    Patient ID: Stephen Robbins, male    DOB: 1935/05/06, 81 y.o.   MRN: [id number]  HPI 81 year old patient who is seen today for his six-month follow-up He has a history of diabetes, CAD by peripheral neuropathy. Last hemoglobin A1c 7.0. He has been followed by urology and also by neurology for Parkinson's disease. He has hypothyroidism and a history of dyslipidemia. Patient is doing well today. He feels his PD.  Symptoms have been quite stable.  Several questions about his medical regimen reviewed and discussed  Past Medical History:  Diagnosis Date  . Arthritis    "joints; a little" (01/04/2014)  . Asymptomatic bilateral carotid artery stenosis    per duplex  05-15-2012  left >39%/   right 40-59%  . Basal cell carcinoma    nose  . Borderline diabetes   . BPH (benign prostatic hypertrophy) with urinary obstruction   . Bradycardia   . Bronchial pneumonia 1958  . Chronotropic incompetence with sinus node dysfunction (HCC)   . Compressed cervical disc   . Compression of lumbar vertebra (HCC)    L4 -- L5  . Dizzy   . Dysmetabolic syndrome   . Fatigue   . Frequency of urination   . GERD (gastroesophageal reflux disease)    occasional  . Hemorrhoids   . Hyperlipidemia   . Hypothyroidism   . Nocturia   . NSVT (nonsustained ventricular tachycardia) [facility])    cardiologist-  dr croitoru  . Pacemaker   . Urgency of urination   . Wears glasses      Social History   Social History  . Marital status: Married    Spouse name: N/A  . Number of children: 0  . Years of education: N/A   Occupational History  . retired Retired    Programme researcher, broadcasting/film/video    Social History Main Topics  . Smoking status: Former Smoker    Packs/day: 1.00    Years: 10.00    Types: Cigarettes    Quit date: 03/25/1968  . Smokeless tobacco: Never Used  . Alcohol use 4.2 oz/week    7 Glasses of wine per week     Comment: 1 glass wine daily  . Drug use: No  . Sexual activity: Not on file   Other  Topics Concern  . Not on file   Social History Narrative   Regular exercise          Past Surgical History:  Procedure Laterality Date  . BASAL CELL CARCINOMA EXCISION     nose  . CLOSED REDUCTION NASAL FRACTURE  09-01-2007  . CYSTOSCOPY N/A 12/28/2012   Procedure: CYSTOSCOPY;  Surgeon: Bernestine Amass, MD;  Location: [facility];  Service: Urology;  Laterality: N/A;  . EXERCISE TOLERENCE TEST  12-03-2012  DR CROITURO   CHRONOTROPIC INCOMPETENCE/ NORMAL RESTING BP W/ APPROPRIATE RESPONSE/ NO CHEST PAIN/ NO ST CHANGES FROM BASELINE  . INSERT / REPLACE / REMOVE PACEMAKER  01/04/2014   [employer] model L121 serial number [id number]  . LACERATION REPAIR Right 1978   middle finger  . NEUROPLASTY / TRANSPOSITION ULNAR NERVE AT ELBOW Left 02-09-2010  . PERMANENT PACEMAKER INSERTION N/A 01/04/2014   Procedure: PERMANENT PACEMAKER INSERTION;  Surgeon: Sanda Klein, MD;  Location: [place] [facility];  Service: Cardiovascular;  Laterality: N/A;  . TONSILLECTOMY AND ADENOIDECTOMY  1944  . TRANSURETHRAL INCISION OF PROSTATE N/A 12/28/2012   Procedure: TRANSURETHRAL INCISION OF THE PROSTATE (TUIP);  Surgeon: Bernestine Amass, MD;  Location: [place];  Service: Urology;  Laterality: N/A;  . ULNAR NERVE TRANSPOSITION      Family History  Problem Relation Age of Onset  . Stroke Father   . Lung cancer Mother     Allergies  Allergen Reactions  . Penicillins Other (See Comments)    Unknown childhood reaction    Current Outpatient Prescriptions on File Prior to Visit  Medication Sig Dispense Refill  . canagliflozin (INVOKANA) 300 MG TABS tablet Take 1 tablet (300 mg total) by mouth daily before breakfast. 30 tablet 6  . fish oil-omega-3 fatty acids 1000 MG capsule Take 2 g by mouth daily.    Marland Kitchen gabapentin (NEURONTIN) 300 MG capsule Take 1 capsule (300 mg total) by mouth 2 (two) times daily. 180 capsule 1  . hydrocortisone 0.5 % cream Apply 1 application  topically 2 (two) times daily as needed. rash    . ibuprofen (ADVIL,MOTRIN) 200 MG tablet Take 200 mg by mouth daily as needed for mild pain.     Marland Kitchen levothyroxine (SYNTHROID, LEVOTHROID) 50 MCG tablet take 1 tablet by mouth once daily BEFORE BREAKFAST 90 tablet 3  . Polyethyl Glycol-Propyl Glycol (SYSTANE ULTRA) 0.4-0.3 % SOLN Place 1 drop into both eyes daily as needed (dry eyes).     . Pramoxine-HC (HYDROCORTISONE ACE-PRAMOXINE) 2.5-1 % CREA Apply rectally twice daily as needed 28.35 g 0  . simvastatin (ZOCOR) 20 MG tablet take 1 tablet by mouth at bedtime 90 tablet 3  . vitamin B-12 (CYANOCOBALAMIN) 1000 MCG tablet Take 1,000 mcg by mouth every morning.     Alveda Reasons 20 MG TABS tablet take 1 tablet by mouth once daily WITH SUPPER 90 tablet 1  . dapagliflozin propanediol (FARXIGA) 10 MG TABS tablet Take 10 mg by mouth daily. (Patient not taking: Reported on 08/06/2016) 90 tablet 1  . mirabegron ER (MYRBETRIQ) 25 MG TB24 tablet Take 25 mg by mouth daily.     No current facility-administered medications on file prior to visit.     BP 100/62 (BP Location: Left Arm, Patient Position: Sitting, Cuff Size: Normal)   Pulse 67   Temp 98.3 F (36.8 C) (Oral)   Ht 5\' 9"  (1.753 m)   Wt 175 lb 12.8 oz (79.7 kg)   SpO2 97%   BMI 25.96 kg/m      Review of Systems  Constitutional: Negative for appetite change, chills, fatigue and fever.  HENT: Negative for congestion, dental problem, ear pain, hearing loss, sore throat, tinnitus, trouble swallowing and voice change.   Eyes: Negative for pain, discharge and visual disturbance.  Respiratory: Negative for cough, chest tightness, wheezing and stridor.   Cardiovascular: Negative for chest pain, palpitations and leg swelling.  Gastrointestinal: Negative for abdominal distention, abdominal pain, blood in stool, constipation, diarrhea, nausea and vomiting.  Genitourinary: Negative for difficulty urinating, discharge, flank pain, genital sores, hematuria  and urgency.  Musculoskeletal: Negative for arthralgias, back pain, gait problem, joint swelling, myalgias and neck stiffness.  Skin: Negative for rash.  Neurological: Negative for dizziness, syncope, speech difficulty, weakness, numbness and headaches.  Hematological: Negative for adenopathy. Does not bruise/bleed easily.  Psychiatric/Behavioral: Negative for behavioral problems and dysphoric mood. The patient is not nervous/anxious.        Objective:   Physical Exam  Constitutional: He is oriented to person, place, and time. He appears well-developed.  Blood pressure low normal  HENT:  Head: Normocephalic.  Right Ear: External ear normal.  Left Ear: External ear normal.  Eyes:  Conjunctivae and EOM are normal.  Neck: Normal range of motion.  Cardiovascular: Normal rate and normal heart sounds.   Pulmonary/Chest: Breath sounds normal.  Abdominal: Bowel sounds are normal.  Musculoskeletal: Normal range of motion. He exhibits no edema or tenderness.  Neurological: He is alert and oriented to person, place, and time.  Psychiatric: He has a normal mood and affect. His behavior is normal.          Assessment & Plan:   Diabetes mellitus.  Will review a hemoglobin A1c (.  This has increased to 7.2.  Unclear why patient is not on metformin therapy.  He does not recall any issues with intolerance.  We'll start metformin at a dose of 500 mg daily and uptitrate to 1 g daily if tolerated) Parkinson's disease.  Follow-up neurology Paroxysmal atrial fibrillation. BPH Hypothyroidism.  Medications updated Hemoglobin A1c reviewed Follow-up 6 months  [id number],PETER Pilar Plate

## 2016-08-06 NOTE — Patient Instructions (Addendum)
WE NOW OFFER   Stephen Robbins's FAST TRACK!!!  SAME DAY Appointments for ACUTE CARE  Such as: Sprains, Injuries, cuts, abrasions, rashes, muscle pain, joint pain, back pain Colds, flu, sore throats, headache, allergies, cough, fever  Ear pain, sinus and eye infections Abdominal pain, nausea, vomiting, diarrhea, upset stomach Animal/insect bites  3 Easy Ways to Schedule: Walk-In Scheduling Call in scheduling Mychart Sign-up: [email or web address]   Please check your hemoglobin A1c every 3-6  Months  Neurology follow-up as scheduled  Return here in 6 months or as needed  Metformin 1 tablet daily in the morning with breakfast for 2 weeks; if this is well tolerated, increase to 2 tablets daily in the morning with breakfast

## 2016-08-22 ENCOUNTER — Ambulatory Visit: Payer: Medicare Other | Admitting: Physical Therapy

## 2016-08-22 ENCOUNTER — Ambulatory Visit: Payer: Medicare Other | Admitting: Occupational Therapy

## 2016-08-22 ENCOUNTER — Ambulatory Visit: Payer: Medicare Other

## 2016-08-22 NOTE — Progress Notes (Signed)
Stephen Robbins was seen today in f/u in the movement clinic.  He was dx with PD last visit, on 06/17/13.  He is on carbidopa/levodopa 25/100 tid.  He has no side effects with the carbidopa/levodopa, but states that "I just don't know what to expect."  He continues to have tremor, which can be bothersome for him.  He has had no falls.  No lightheadedness.  No near syncope.  No hallucinations.  He had a carotid ultrasound done since last visit.  There is 1-39% stenosis bilaterally.  He did have some lab work done since last visit.  His hemoglobin A1c has greatly improved.  It is 6.3 (was 7.9 and prior to that was 9.4). He just finished PT and is enrolled in the exercise class now.  He presents today with a long list of questions.  He again tape records our session.    11/19/13 update:  Pt is f/u regarding Parkinson's disease.  He is currently on carbidopa/levodopa 25/100, one tablet 3 times per day. He is accompanied by his wife, who supplements the history.   Patient denies any falls.  He denies hallucinations.  Denies nausea or vomiting.  No lightheadedness or near syncope.  He is exercising.  He still has tremor and isn't sure that the levodopa helps tremor.  He sent me several e-mails since last visit.  One of these was just a TV news link regarding [facility] high-resolution imaging and deep brain stimulation.  He subsequently sent another e-mail regarding focused ultrasound.  Asks about updates regarding these and has 2 pages of questions.  Asks about why we can't give him human growth hormone or steroids for PD.  Overall, patient is clinically doing well.   02/21/14 update:   Pt returns for f/u, accompanied by his wife who supplements the history.  The records that were made available to me were reviewed.  Pt has had PPM since last visit due to bradycardia.  He has been told not to exercise until 12/9.  He will start with PWR moves classes after the first of the year as well as circuit 2 classes.  He is still  on carbidopa/levodopa 25/100 at 8-9am/12-1pm/6-7 pm.  He is c/o paresthesias/"neuropathy" in the R foot.  He only notices it with driving.   He has no back pain.  He has no sensation of balance loss with closing eyes in the shower.  He thinks that exposure to levodopa causes the neuropathy and brings with him a small study (58 pts) that suggests that levodopa could be an etiology for the neuropathy.  Notices lack of smell.  No falls.  Rare lightheadedness. No hallucinations.    05/23/14 update:  Pt is f/u today, accompanied by his wife who supplements the history.  [facility] visit and I reviewed those records.  He was told he had PN (known diagnosis) but no new meds were prescribed.  He asks about a "cure" for PN that a doctor in [place] was advertising.   He remains on carbidopa/levodopa 25/100 tid for the PD.  He is exercising faithfully.  He has trouble getting himself over 3 miles per hour when walking and he is concerned about that.  He asks again about taking steroids and human growth hormone.  He also asks about taking protein supplements.  He has d/c the glucosamine for joint pain/arthritis as he didn't think that it was helpful.    09/08/14 update:  The patient  is following up today.  He is accompanied by his wife who supplements the history.   Records were reviewed since last visit, from his primary care physician, podiatrist, as well as his cardiologist.  He remains on carbidopa/levodopa 25/100, one tablet 3 times per day.  He notices no wearing off.  He is doing PD circuit class.  He called his podiatrist since last visit and asked him about medications for neuropathy and he deferred to me.  We started gabapentin on May 30, 2014.  He is on 300 mg twice a day.  He states that his feet numbness is better.  He has continued to complain about fatigue.  His pacemaker was recently adjusted.  He is trying to walk/exercise at the park and his endurance has been better ever  since.  He again asks about energy.  He is back on testosterone for that.    01/12/15 update:  The patient returns today for follow-up, accompanied by his wife who supplements the history.  He remains on carbidopa/levodopa 25/100, one tablet 3 times per day.  Overall, the patient states that he is doing well.   Still has some intermittent tremor.  He denies any syncope.  He has had no falls since last visit.  He has had some near falls.  No hallucinations or visual distortions.  He is on gabapentin, 300 mg twice a day for peripheral neuropathy secondary to diabetes mellitus. Pt states that it is helping with the sx's, along with the "natural herb that I take."   He continues to faithfully exercise with the PWR classes.  Some lightheaded after exercise.  No new medical issues since last visit.    04/18/15 update:  The patient presents today, accompanied by his wife who supplements the history.  Last visit, increased the patient's carbidopa/levodopa 25/100, so that he is now taking 1-1/2 tablets 3 times per day.   When asked how he feel, he states, "I don't know how I am supposed to feel."  He denies stiffness.  He has minimal thumb tremor.   He is supposed to be on gabapentin, 300 mg twice a day for peripheral neuropathy but went down to q day because his feet weren't bothering him.  I have reviewed records made available to me since last visit.  He was diagnosed with atrial fibrillation and started on Xarelto.  He is doing well with that.  He denies any bleeding difficulties.  No falls.  No near syncope.  Rarely does he even feel unstable.  He is exercising.  Goes to the gym three times a week.    08/22/15 update:  The patient presents today, accompanied by his wife who supplements the history. He is on carbidopa/levodopa 25/100, 1-1/2 tablets 3 times per day.  He remains on gabapentin, 300 mg once a day for peripheral neuropathy (7-9pm). He states that he doesn't think that he can come off of it, at least until  he moves.  He did come to the PD support group lecture that I gave 2 weeks ago.  He denies hallucinations, lightheadedness, near syncope.  He does state that they are moving to [place] in assisted living.  States that there has been some stress with moving, as he is having to fix a hole that was discovered in the bathroom floor.   Asks me about alpha-lipoic acid and states that the people who sell it to him "think that it is good."    12/26/15 update:  The patient follows up today, accompanied  by his wife who supplements the history.  He remains on carbidopa/levodopa 25/100, 1-1/2 tablets 3 times per day.  He is still on gabapentin, 300 mg at night for neuropathy.  States that he doesn't want to try and d/c that.    He has since moved to [place] and they are adjusting well.  They have sold their house.  He is doing a [employer] class.  No falls.  Some near falls.    04/29/16 update:  Patient follows up today, accompanied by his wife who supplements the history.  Patient is on carbidopa/levodopa 25/100, 1.5 tablets 3 times per day (8am/noon/5pm).  Pt denies falls.  Pt denies lightheadedness, near syncope.  No hallucinations.  Mood has been "up and down."  Occasional bouts of constipation vs diarrhea.   On gabapentin 300 q hs for PN.  Did OT/ST since our last visit and reviewed those notes.  Doing PWR moves classes on wednesdays.    09/03/16 update:  Patient seen today in follow-up, accompanied by his wife who supplements the history.  Patient remains on carbidopa/levodopa 25/100 25/100, 1-1/2 tablets at 8 AM/noon/5 PM.  He has had no falls.  No lightheadedness or near syncope.  No hallucinations.  Remains on gabapentin, but now on 300 mg bid.  Seems to be doing better with the neuropathic sx's now.  The records that were made available to me were reviewed.  Metformin started by pcp as HgBA1C was rising.  Has some constipation.  He pulled me to the side and states that he is slightly more depressed dealing with  his wifes alzheimers.  No SI/HI.    No neuroimaging since CT brain in 2009.  PREVIOUS MEDICATIONS: none to date  ALLERGIES:   Allergies  Allergen Reactions  . Penicillins Other (See Comments)    Unknown childhood reaction    CURRENT MEDICATIONS:  Current Outpatient Prescriptions on File Prior to Visit  Medication Sig Dispense Refill  . canagliflozin (INVOKANA) 300 MG TABS tablet Take 1 tablet (300 mg total) by mouth daily before breakfast. 30 tablet 6  . carbidopa-levodopa (SINEMET IR) 25-100 MG tablet take 1 and 1/2 tablets by mouth three times a day before meals 405 tablet 1  . fish oil-omega-3 fatty acids 1000 MG capsule Take 2 g by mouth daily.    Marland Kitchen gabapentin (NEURONTIN) 300 MG capsule Take 1 capsule (300 mg total) by mouth 2 (two) times daily. 180 capsule 1  . hydrocortisone 0.5 % cream Apply 1 application topically 2 (two) times daily as needed. rash    . ibuprofen (ADVIL,MOTRIN) 200 MG tablet Take 200 mg by mouth daily as needed for mild pain.     Marland Kitchen levothyroxine (SYNTHROID, LEVOTHROID) 50 MCG tablet take 1 tablet by mouth once daily BEFORE BREAKFAST 90 tablet 3  . metFORMIN (GLUCOPHAGE-XR) 500 MG 24 hr tablet Take 2 tablets (1,000 mg total) by mouth daily with breakfast. 180 tablet 5  . mirabegron ER (MYRBETRIQ) 25 MG TB24 tablet Take 25 mg by mouth daily.    Vladimir Faster Glycol-Propyl Glycol (SYSTANE ULTRA) 0.4-0.3 % SOLN Place 1 drop into both eyes daily as needed (dry eyes).     . Pramoxine-HC (HYDROCORTISONE ACE-PRAMOXINE) 2.5-1 % CREA Apply rectally twice daily as needed 28.35 g 0  . sertraline (ZOLOFT) 25 MG tablet Take 1 tablet (25 mg total) by mouth every morning. Reported on 06/02/2015 30 tablet 5  . simvastatin (ZOCOR) 20 MG tablet take 1 tablet by mouth at bedtime 90 tablet 3  .  vitamin B-12 (CYANOCOBALAMIN) 1000 MCG tablet Take 1,000 mcg by mouth every morning.     Alveda Reasons 20 MG TABS tablet take 1 tablet by mouth once daily WITH SUPPER 90 tablet 1   No current  facility-administered medications on file prior to visit.     PAST MEDICAL HISTORY:   Past Medical History:  Diagnosis Date  . Arthritis    "joints; a little" (01/04/2014)  . Asymptomatic bilateral carotid artery stenosis    per duplex  05-15-2012  left >39%/   right 40-59%  . Basal cell carcinoma    nose  . Borderline diabetes   . BPH (benign prostatic hypertrophy) with urinary obstruction   . Bradycardia   . Bronchial pneumonia 1958  . Chronotropic incompetence with sinus node dysfunction (HCC)   . Compressed cervical disc   . Compression of lumbar vertebra (HCC)    L4 -- L5  . Dizzy   . Dysmetabolic syndrome   . Fatigue   . Frequency of urination   . GERD (gastroesophageal reflux disease)    occasional  . Hemorrhoids   . Hyperlipidemia   . Hypothyroidism   . Nocturia   . NSVT (nonsustained ventricular tachycardia) [facility])    cardiologist-  dr croitoru  . Pacemaker   . Urgency of urination   . Wears glasses     PAST SURGICAL HISTORY:   Past Surgical History:  Procedure Laterality Date  . BASAL CELL CARCINOMA EXCISION     nose  . CLOSED REDUCTION NASAL FRACTURE  09-01-2007  . CYSTOSCOPY N/A 12/28/2012   Procedure: CYSTOSCOPY;  Surgeon: Bernestine Amass, MD;  Location: [facility];  Service: Urology;  Laterality: N/A;  . EXERCISE TOLERENCE TEST  12-03-2012  DR CROITURO   CHRONOTROPIC INCOMPETENCE/ NORMAL RESTING BP W/ APPROPRIATE RESPONSE/ NO CHEST PAIN/ NO ST CHANGES FROM BASELINE  . INSERT / REPLACE / REMOVE PACEMAKER  01/04/2014   [employer] model L121 serial number [id number]  . LACERATION REPAIR Right 1978   middle finger  . NEUROPLASTY / TRANSPOSITION ULNAR NERVE AT ELBOW Left 02-09-2010  . PERMANENT PACEMAKER INSERTION N/A 01/04/2014   Procedure: PERMANENT PACEMAKER INSERTION;  Surgeon: Sanda Klein, MD;  Location: [facility] CATH [facility];  Service: Cardiovascular;  Laterality: N/A;  . TONSILLECTOMY AND ADENOIDECTOMY  1944  . TRANSURETHRAL  INCISION OF PROSTATE N/A 12/28/2012   Procedure: TRANSURETHRAL INCISION OF THE PROSTATE (TUIP);  Surgeon: Bernestine Amass, MD;  Location: [facility];  Service: Urology;  Laterality: N/A;  . ULNAR NERVE TRANSPOSITION      SOCIAL HISTORY:   Social History   Social History  . Marital status: Married    Spouse name: N/A  . Number of children: 0  . Years of education: N/A   Occupational History  . retired Retired    Programme researcher, broadcasting/film/video    Social History Main Topics  . Smoking status: Former Smoker    Packs/day: 1.00    Years: 10.00    Types: Cigarettes    Quit date: 03/25/1968  . Smokeless tobacco: Never Used  . Alcohol use 4.2 oz/week    7 Glasses of wine per week     Comment: 1 glass wine daily  . Drug use: No  . Sexual activity: Not on file   Other Topics Concern  . Not on file   Social History Narrative   Regular exercise          FAMILY HISTORY:   Family Status  Relation Status  .  Father Deceased       stroke  . Mother Deceased       lung cancer  . Brother Alive       healthy    ROS:    A complete 10 system review of systems was obtained and was unremarkable apart from what is mentioned above.  PHYSICAL EXAMINATION:    VITALS:   Vitals:   09/03/16 1336  BP: 124/64  Pulse: 78  SpO2: 94%  Weight: 175 lb (79.4 kg)  Height: '5\' 9"'$  (1.753 m)   Wt Readings from Last 3 Encounters:  09/03/16 175 lb (79.4 kg)  08/06/16 175 lb 12.8 oz (79.7 kg)  07/16/16 178 lb 9.6 oz (81 kg)     GEN:  The patient appears stated age and is in NAD. HEENT:  [place]/AT.  MMM CV:  RRR (no afib today) Lungs:  CTAB Neck:  No bruits   Neurological examination:  Orientation: The patient is alert and oriented x3. Fund of knowledge is appropriate.  Recent and remote memory are intact.  Cranial nerves: There is good facial symmetry. Mild facial hypomimia.  Pupils are equal round and reactive to light bilaterally. Fundoscopic exam reveals clear margins bilaterally. Extraocular  muscles are intact. The visual fields are full to confrontational testing. The speech is fluent and clear.  Soft palate rises symmetrically and there is no tongue deviation. Hearing is intact to conversational tone. Motor: Strength is 5/5 in the bilateral upper and lower extremities.   Shoulder shrug is equal and symmetric.  There is no pronator drift. Sensation:  Intact to light touch.  More sensitive aspects not tested today  Movement examination: Tone: There is very mild rigidity in the RUE.  It is good elsewhere Abnormal movements: There is intermittent LUE resting tremor Coordination:  There are decreased RAM's only in the feet and present with toe taps bilaterally Gait and Station: The patient has no difficulty arising out of a deep-seated chair without the use of the hands. The patient's stride length is fairly normal with just slight decreased arm swing on the L.  Negative pull test.      LABS:  Lab Results  Component Value Date   TSH 4.49 01/31/2016   Lab Results  Component Value Date   VITAMINB12 872 06/17/2013     Chemistry      Component Value Date/Time   NA 137 01/31/2016 0914   K 4.2 01/31/2016 0914   CL 103 01/31/2016 0914   CO2 27 01/31/2016 0914   BUN 17 01/31/2016 0914   CREATININE 0.85 01/31/2016 0914   CREATININE 0.77 12/29/2013 1627      Component Value Date/Time   CALCIUM 9.7 01/31/2016 0914   ALKPHOS 41 01/31/2016 0914   AST 26 01/31/2016 0914   ALT 8 01/31/2016 0914   BILITOT 0.8 01/31/2016 0914     RPR negative Lab Results  Component Value Date   FOLATE >20.0 06/17/2013     Lab Results  Component Value Date   HGBA1C 7.3 08/06/2016    ASSESSMENT/PLAN:  1. idiopathic Parkinson's disease.  I see no atypical features.    -He will continue the carbidopa/levodopa 25/100, 1.5 tablets 3 times daily.    -brought a long list of questions again today and answered them to the best of my ability.  -met with our social worker and discussed in detail  all of the community resources  -encouraged him to come to new support group for pts/caregivers 2.  Peripheral neuropathy  -There  is evidence of this on examination.  Likely from DM.  His workup for reversible causes was negative.  His diabetes is under much better control than in the past.  -improved on gabapentin 300 mg bid (was q hs) 3.  Hx of transaminasemia  -These were elevated in 2012 but have improved 4.  R carotid bruit with hx of carotid stenosis  -04/2012 u/s with 40-50% stenosis on the R.  this was rechecked in 2015 and was 1-39% bilaterally. 5.  Constipation  -talked about rancho recipe and given that today.  Talked about proper hydration and what that means 6.  Depression  -associated with caregiving for wife.  Talked about counseling and met with our social worker today.   6. Follow up is anticipated in the next 5 months, sooner should new neurologic issues arise.  Much greater than 50% of this visit was spent in counseling and coordinating care.  Total face to face time:  30 min

## 2016-08-27 ENCOUNTER — Ambulatory Visit: Payer: Medicare Other | Admitting: Neurology

## 2016-09-03 ENCOUNTER — Ambulatory Visit (INDEPENDENT_AMBULATORY_CARE_PROVIDER_SITE_OTHER): Payer: Medicare Other | Admitting: Neurology

## 2016-09-03 ENCOUNTER — Encounter: Payer: Self-pay | Admitting: Neurology

## 2016-09-03 VITALS — BP 124/64 | HR 78 | Ht 69.0 in | Wt 175.0 lb

## 2016-09-03 DIAGNOSIS — G20 Parkinson's disease: Secondary | ICD-10-CM

## 2016-09-03 DIAGNOSIS — G20A1 Parkinson's disease without dyskinesia, without mention of fluctuations: Secondary | ICD-10-CM

## 2016-09-03 DIAGNOSIS — K5901 Slow transit constipation: Secondary | ICD-10-CM | POA: Diagnosis not present

## 2016-09-03 NOTE — Patient Instructions (Signed)
Constipation and Parkinson's disease:  1.Rancho recipe for constipation in Parkinsons Disease:  -1 cup of unprocessed bran (need to get this at [employer], [employer] or similar type of store), 2 cups of applesauce in 1 cup of prune juice 2.  Increase fiber intake (Metamucil,vegetables) 3.  Regular, moderate exercise can be beneficial. 4.  Avoid medications causing constipation, such as medications like antacids with calcium or magnesium 5.  Laxative overuse should be avoided. 6.  Stool softeners (Colace) can help with chronic constipation. 7.  Increase water intake.  You should be drinking 1/2 gallon of water a day as long as you have not been diagnosed with congestive heart failure or renal/kidney failure.  This is probably the single greatest thing that you can do to help your constipation.  

## 2016-09-03 NOTE — Progress Notes (Signed)
[facility] Clinical Social Work Note  CSW received request from Dr. Carles Collet to meet with pt and pt wife during today's follow up visit. Per Dr. Carles Collet, pt shared with Dr. Carles Collet is private that he has some depression associated with caregiving for pt wife who has a diagnosis of Alzheimer's. CSW met with pt and pt wife in [place] office. CSW provided supportive listening as pt discussed that he was diagnosed with Parkinson's disease in 2015. Pt shared that pt and pt wife live in [place] independent living facility. Pt reports that they moved to [place] approximately one year ago. CSW discussed community resources including exercise programs and [employer] Parkinson's support group. Pt reports that he participates in [employer] Moves classes one time per week. Pt states that he has not looked into exercise options that [name] may offer, but reports that pt and pt wife are attending a new resident gathering today at [facility] and may be able to learn more information there. CSW discussed resources for caregivers including caregiver support group offered by [facility] and one meeting meets at [employer]. Pt familiar with resource. CSW discussed other resources including [employer], Respite, and Just[employer] programs offered through [facility]. CSW educated surrounding individual counseling and provided list of local mental health therapist. CSW discussed with pt that CSW will reach out to [place] to find out if facility has a Education officer, museum for independent living residents or who the best contact is if pt needed additional assistance. CSW shared automatic alarmed pill box and discussed advantages, but pt feels pt and pt wife current medication management works well. CSW discussed upcoming Parkinson Education Symposium and assisted pt and pt wife with registration for symposium. CSW clarified questions and concerns to the best of CSW knowledge. CSW provided CSW contact information and encouraged pt to  contact CSW with any additional questions or further social work needs.   CSW contact [name] at [place] [facility] [place] does not have a social workers assigned to the independent living residents, but there is a Marine scientist clinic and nurse that is helps navigate resources, Sherryll Burger. Pt had mentioned the nurse resource during visit, but CSW will contact pt and notify that the nurse is the best contact within the facility regarding psychosocial needs that may arise.   CSW will remain available as a resource to provide psychosocial support to pt and pt wife.   Alison Murray, MSW, LCSW Clinical Social Worker Movement [facility] [phone]

## 2016-09-11 ENCOUNTER — Other Ambulatory Visit: Payer: Self-pay | Admitting: Cardiovascular Disease

## 2016-10-15 ENCOUNTER — Telehealth: Payer: Self-pay | Admitting: Cardiology

## 2016-10-15 ENCOUNTER — Ambulatory Visit (INDEPENDENT_AMBULATORY_CARE_PROVIDER_SITE_OTHER): Payer: Medicare Other | Admitting: *Deleted

## 2016-10-15 DIAGNOSIS — I495 Sick sinus syndrome: Secondary | ICD-10-CM | POA: Diagnosis not present

## 2016-10-15 NOTE — Telephone Encounter (Signed)
Spoke with pt and reminded pt of remote transmission that is due today. Pt verbalized understanding.   

## 2016-10-16 NOTE — Progress Notes (Signed)
Remote pacemaker transmission.   

## 2016-10-17 ENCOUNTER — Encounter: Payer: Self-pay | Admitting: Cardiology

## 2016-10-25 ENCOUNTER — Other Ambulatory Visit: Payer: Self-pay | Admitting: Internal Medicine

## 2016-10-30 ENCOUNTER — Other Ambulatory Visit: Payer: Self-pay | Admitting: Internal Medicine

## 2016-10-30 MED ORDER — CANAGLIFLOZIN 300 MG PO TABS: ORAL_TABLET | ORAL | 2 refills | Status: DC

## 2016-11-20 LAB — CUP PACEART REMOTE DEVICE CHECK
Battery Remaining Longevity: 150 mo
Battery Remaining Percentage: 100 %
Brady Statistic RA Percent Paced: 43 %
Brady Statistic RV Percent Paced: 2 %
Date Time Interrogation Session: 20180724095700
Implantable Lead Implant Date: 20151013
Implantable Lead Implant Date: 20151013
Implantable Lead Location: 753859
Implantable Lead Location: 753860
Implantable Lead Model: 4135
Implantable Lead Model: 4136
Implantable Lead Serial Number: 29480373
Implantable Lead Serial Number: 29608302
Implantable Pulse Generator Implant Date: 20151013
Lead Channel Impedance Value: 569 Ohm
Lead Channel Impedance Value: 652 Ohm
Lead Channel Setting Pacing Amplitude: 2 V
Lead Channel Setting Pacing Amplitude: 4 V
Lead Channel Setting Pacing Pulse Width: 1 ms
Lead Channel Setting Sensing Sensitivity: 2.5 mV
Pulse Gen Serial Number: 702951

## 2016-11-26 ENCOUNTER — Encounter: Payer: Self-pay | Admitting: Cardiovascular Disease

## 2016-11-27 ENCOUNTER — Other Ambulatory Visit: Payer: Self-pay | Admitting: Internal Medicine

## 2016-11-27 MED ORDER — CANAGLIFLOZIN 300 MG PO TABS: ORAL_TABLET | ORAL | 2 refills | Status: DC

## 2016-12-12 ENCOUNTER — Encounter: Payer: Self-pay | Admitting: Internal Medicine

## 2017-01-06 ENCOUNTER — Encounter (HOSPITAL_BASED_OUTPATIENT_CLINIC_OR_DEPARTMENT_OTHER): Payer: Self-pay | Admitting: *Deleted

## 2017-01-06 ENCOUNTER — Emergency Department (HOSPITAL_BASED_OUTPATIENT_CLINIC_OR_DEPARTMENT_OTHER)
Admission: EM | Admit: 2017-01-06 | Discharge: 2017-01-06 | Disposition: A | Discharge status: Home / Self Care | Payer: Medicare Other | Attending: Emergency Medicine | Admitting: Emergency Medicine

## 2017-01-06 DIAGNOSIS — Z87891 Personal history of nicotine dependence: Secondary | ICD-10-CM | POA: Insufficient documentation

## 2017-01-06 DIAGNOSIS — K5901 Slow transit constipation: Secondary | ICD-10-CM | POA: Insufficient documentation

## 2017-01-06 DIAGNOSIS — Z79899 Other long term (current) drug therapy: Secondary | ICD-10-CM | POA: Insufficient documentation

## 2017-01-06 DIAGNOSIS — K625 Hemorrhage of anus and rectum: Secondary | ICD-10-CM | POA: Diagnosis present

## 2017-01-06 DIAGNOSIS — Z7901 Long term (current) use of anticoagulants: Secondary | ICD-10-CM | POA: Diagnosis not present

## 2017-01-06 DIAGNOSIS — E039 Hypothyroidism, unspecified: Secondary | ICD-10-CM | POA: Diagnosis not present

## 2017-01-06 DIAGNOSIS — Z7984 Long term (current) use of oral hypoglycemic drugs: Secondary | ICD-10-CM | POA: Diagnosis not present

## 2017-01-06 DIAGNOSIS — G20 Parkinson's disease: Secondary | ICD-10-CM | POA: Diagnosis not present

## 2017-01-06 MED ORDER — POLYETHYLENE GLYCOL 3350 17 GM/SCOOP PO POWD: 17.0000 g | Freq: Every day | ORAL | 0 refills | Status: DC

## 2017-01-06 MED FILL — POLYETHYLENE GLYCOL 3350: 15 days supply | Qty: 255 | Fill #0

## 2017-01-06 NOTE — ED Notes (Signed)
ED Provider at bedside. 

## 2017-01-06 NOTE — ED Provider Notes (Signed)
[facility] EMERGENCY DEPARTMENT Provider Note   CSN: [id number] Arrival date & time: 01/06/17  1240     History   Chief Complaint Chief Complaint  Patient presents with  . Rectal Bleeding  . Constipation    HPI Stephen Robbins is a 81 y.o. male.  HPI Patient presents the emergency department with complaints of a small amount of rectal bleeding after attempting to pass a hard stool today.  His Parkinson's disease.  He sometimes struggles with constipation.  He has had no regular bowel movement since 3 days ago.  He states he strained hard today to have a bowel movement and then noticed a small blood.  He is unable to pass a piece of small stool. He feels like he needs to have a bowel movement now.  No abdominal pain.  Is on chronic anticoagulation with xarelto.    Past Medical History:  Diagnosis Date  . Arthritis    "joints; a little" (01/04/2014)  . Asymptomatic bilateral carotid artery stenosis    per duplex  05-15-2012  left >39%/   right 40-59%  . Basal cell carcinoma    nose  . Borderline diabetes   . BPH (benign prostatic hypertrophy) with urinary obstruction   . Bradycardia   . Bronchial pneumonia 1958  . Chronotropic incompetence with sinus node dysfunction (HCC)   . Compressed cervical disc   . Compression of lumbar vertebra (HCC)    L4 -- L5  . Dizzy   . Dysmetabolic syndrome   . Fatigue   . Frequency of urination   . GERD (gastroesophageal reflux disease)    occasional  . Hemorrhoids   . Hyperlipidemia   . Hypothyroidism   . Nocturia   . NSVT (nonsustained ventricular tachycardia) [facility])    cardiologist-  dr croitoru  . Pacemaker   . Urgency of urination   . Wears glasses     Patient Active Problem List   Diagnosis Date Noted  . Paroxysmal atrial fibrillation ([place]) 02/23/2015  . Chronotropic incompetence with sinus node dysfunction (HCC)   . Pacemaker - [place] serial number [id number] 01/04/2014  . SSS (sick  sinus syndrome) ([place]) 12/20/2013  . Chronotropic incompetence with autonomic dysfunction 12/20/2013  . Diabetic peripheral neuropathy ([place]) 06/17/2013  . Transaminasemia 06/17/2013  . Paralysis agitans ([place]) 06/17/2013  . Parkinson's disease ([place]) 06/17/2013  . NSVT (nonsustained ventricular tachycardia) ([place]) 12/18/2012  . Bradycardia 11/26/2012  . Dizziness, nonspecific 11/26/2012  . Cough 01/04/2012  . CARPAL TUNNEL SYNDROME, BILATERAL 01/23/2010  . HEARING LOSS 12/18/2009  . HYPOGLYCEMIA, UNSPECIFIED 10/10/2008  . Seasonal and perennial allergic rhinitis 03/26/2008  . UNS ADVRS EFF UNS RX MEDICINAL&BIOLOGICAL SBSTNC 09/17/2007  . HYPOGONADISM, MALE 04/23/2007  . DEPRESSION, MILD 02/10/2007  . BENIGN PROSTATIC HYPERTROPHY, WITH OBSTRUCTION 02/10/2007  . OSTEOARTHRITIS 02/10/2007  . LOW BACK PAIN 02/10/2007  . HYPERGLYCEMIA, BORDERLINE 02/10/2007  . Hypothyroidism 10/14/2006  . INTERNAL HEMORRHOIDS 10/14/2006  . Dyslipidemia 09/18/2006    Past Surgical History:  Procedure Laterality Date  . BASAL CELL CARCINOMA EXCISION     nose  . CLOSED REDUCTION NASAL FRACTURE  09-01-2007  . CYSTOSCOPY N/A 12/28/2012   Procedure: CYSTOSCOPY;  Surgeon: Bernestine Amass, MD;  Location: [facility];  Service: Urology;  Laterality: N/A;  . EXERCISE TOLERENCE TEST  12-03-2012  DR CROITURO   CHRONOTROPIC INCOMPETENCE/ NORMAL RESTING BP W/ APPROPRIATE RESPONSE/ NO CHEST PAIN/ NO ST CHANGES FROM BASELINE  . INSERT / REPLACE / REMOVE  PACEMAKER  01/04/2014   [employer] model L121 serial number [id number]  . LACERATION REPAIR Right 1978   middle finger  . NEUROPLASTY / TRANSPOSITION ULNAR NERVE AT ELBOW Left 02-09-2010  . PERMANENT PACEMAKER INSERTION N/A 01/04/2014   Procedure: PERMANENT PACEMAKER INSERTION;  Surgeon: Sanda Klein, MD;  Location: [facility];  Service: Cardiovascular;  Laterality: N/A;  . TONSILLECTOMY AND ADENOIDECTOMY  1944  . TRANSURETHRAL INCISION  OF PROSTATE N/A 12/28/2012   Procedure: TRANSURETHRAL INCISION OF THE PROSTATE (TUIP);  Surgeon: Bernestine Amass, MD;  Location: [facility];  Service: Urology;  Laterality: N/A;  . ULNAR NERVE TRANSPOSITION         Home Medications    Prior to Admission medications   Medication Sig Start Date End Date Taking? Authorizing Provider  canagliflozin (INVOKANA) 300 MG TABS tablet TAKE ONE TABLET BY MOUTH DAILY BEFORE BREAKFAST 11/27/16   Marletta Lor, MD  carbidopa-levodopa (SINEMET IR) 25-100 MG tablet take 1 and 1/2 tablets by mouth three times a day before meals 08/06/16   [name], Rebecca S, DO  fish oil-omega-3 fatty acids 1000 MG capsule Take 2 g by mouth daily.    [provider]  gabapentin (NEURONTIN) 300 MG capsule Take 1 capsule (300 mg total) by mouth 2 (two) times daily. 08/02/16   [name], [name], DO  hydrocortisone 0.5 % cream Apply 1 application topically 2 (two) times daily as needed. rash    [provider]  ibuprofen (ADVIL,MOTRIN) 200 MG tablet Take 200 mg by mouth daily as needed for mild pain.     [provider]  levothyroxine (SYNTHROID, LEVOTHROID) 50 MCG tablet take 1 tablet by mouth once daily BEFORE BREAKFAST 03/15/16   Marletta Lor, MD  metFORMIN (GLUCOPHAGE-XR) 500 MG 24 hr tablet Take 2 tablets (1,000 mg total) by mouth daily with breakfast. 08/06/16   Marletta Lor, MD  mirabegron ER (MYRBETRIQ) 25 MG TB24 tablet Take 25 mg by mouth daily.    [provider]  Polyethyl Glycol-Propyl Glycol (SYSTANE ULTRA) 0.4-0.3 % SOLN Place 1 drop into both eyes daily as needed (dry eyes).     [provider]  Pramoxine-HC (HYDROCORTISONE ACE-PRAMOXINE) 2.5-1 % CREA Apply rectally twice daily as needed 07/08/16   Quintella Reichert, MD  sertraline (ZOLOFT) 25 MG tablet Take 1 tablet (25 mg total) by mouth every morning. Reported on 06/02/2015 08/06/16   Marletta Lor, MD  simvastatin (ZOCOR) 20 MG tablet take  1 tablet by mouth at bedtime 12/05/15   Marletta Lor, MD  vitamin B-12 (CYANOCOBALAMIN) 1000 MCG tablet Take 1,000 mcg by mouth every morning.     [provider]  XARELTO 20 MG TABS tablet TAKE 1 TABLET BY MOUTH DAILY 09/12/16   Croitoru, [name], MD    Family History Family History  Problem Relation Age of Onset  . Stroke Father   . Lung cancer Mother     Social History Social History  Substance Use Topics  . Smoking status: Former Smoker    Packs/day: 1.00    Years: 10.00    Types: Cigarettes    Quit date: 03/25/1968  . Smokeless tobacco: Never Used  . Alcohol use 4.2 oz/week    7 Glasses of wine per week     Comment: 1 glass wine daily     Allergies   Penicillins   Review of Systems Review of Systems  All other systems reviewed and are negative.    Physical  Exam Updated Vital Signs BP (!) 147/68 (BP Location: Right Arm)   Pulse 70   Temp 98.3 F (36.8 C) (Oral)   Resp 18   Ht 5\' 9"  (1.753 m)   Wt 77.1 kg (170 lb)   SpO2 98%   BMI 25.10 kg/m   Physical Exam  Constitutional: He is oriented to person, place, and time. He appears well-developed and well-nourished.  HENT:  Head: Normocephalic.  Eyes: EOM are normal.  Neck: Normal range of motion.  Pulmonary/Chest: Effort normal.  Abdominal: He exhibits no distension.  Genitourinary:  Genitourinary Comments: Small perianal fissure noted without active bleeding. Rectal exam demonstrates hard stool burden which was able to be removed at the bedside  Musculoskeletal: Normal range of motion.  Neurological: He is alert and oriented to person, place, and time.  Psychiatric: He has a normal mood and affect.  Nursing note and vitals reviewed.    ED Treatments / Results  Labs (all labs ordered are listed, but only abnormal results are displayed) Labs Reviewed - No data to display  EKG  EKG Interpretation None       Radiology No results found.  Procedures Fecal disimpaction Performed  by: Jola Schmidt Authorized by: Jola Schmidt  Consent: Verbal consent obtained. Risks and benefits: risks, benefits and alternatives were discussed Time out: Immediately prior to procedure a "time out" was called to verify the correct patient, procedure, equipment, support staff and site/side marked as required. Patient tolerance: Patient tolerated the procedure well with no immediate complications Comments: Large stool removed    (including critical care time)  Medications Ordered in ED Medications - No data to display   Initial Impression / Assessment and Plan / ED Course  I have reviewed the triage vital signs and the nursing notes.  Pertinent labs & imaging results that were available during my care of the patient were reviewed by me and considered in my medical decision making (see chart for details).     Feels much better after disimpaction.  Patient allergic bowel movement.  No overt rectal bleeding at this time.  He has a small fissure which is no longer bleeding.  Recommended daily MiraLAX.  Close primary care follow-up.  Patient understands to return to the emergency department for new or worsening symptoms  Final Clinical Impressions(s) / ED Diagnoses   Final diagnoses:  Slow transit constipation    New Prescriptions New Prescriptions   No medications on file     Jola Schmidt, MD 01/06/17 1437

## 2017-01-06 NOTE — ED Triage Notes (Signed)
Pt reports constipation. Had 2 small BMs today but noticed BRB when he wiped. Concerned he may have a tear and he is still constipated

## 2017-01-06 NOTE — ED Notes (Signed)
Pt getting dressed. Reports large BM after disimpaction. Will contact [facility] per pt request to see if transport is available for pt to return home

## 2017-01-07 DIAGNOSIS — Z23 Encounter for immunization: Secondary | ICD-10-CM | POA: Diagnosis not present

## 2017-01-14 ENCOUNTER — Ambulatory Visit (INDEPENDENT_AMBULATORY_CARE_PROVIDER_SITE_OTHER): Payer: Medicare Other | Admitting: *Deleted

## 2017-01-14 DIAGNOSIS — I495 Sick sinus syndrome: Secondary | ICD-10-CM | POA: Diagnosis not present

## 2017-01-15 NOTE — Progress Notes (Signed)
Remote pacemaker transmission.   

## 2017-01-16 LAB — CUP PACEART REMOTE DEVICE CHECK
Battery Remaining Longevity: 150 mo
Battery Remaining Percentage: 100 %
Brady Statistic RA Percent Paced: 43 %
Brady Statistic RV Percent Paced: 2 %
Date Time Interrogation Session: 20181023074700
Implantable Lead Implant Date: 20151013
Implantable Lead Implant Date: 20151013
Implantable Lead Location: 753859
Implantable Lead Location: 753860
Implantable Lead Model: 4135
Implantable Lead Model: 4136
Implantable Lead Serial Number: 29480373
Implantable Lead Serial Number: 29608302
Implantable Pulse Generator Implant Date: 20151013
Lead Channel Impedance Value: 549 Ohm
Lead Channel Impedance Value: 671 Ohm
Lead Channel Setting Pacing Amplitude: 2 V
Lead Channel Setting Pacing Amplitude: 4 V
Lead Channel Setting Pacing Pulse Width: 1 ms
Lead Channel Setting Sensing Sensitivity: 2.5 mV
Pulse Gen Serial Number: 702951

## 2017-01-17 ENCOUNTER — Encounter: Payer: Self-pay | Admitting: Cardiology

## 2017-01-30 ENCOUNTER — Other Ambulatory Visit: Payer: Self-pay | Admitting: Internal Medicine

## 2017-01-30 MED ORDER — HYDROCORTISONE 0.5 % EX CREA: 1.0000 "application " | TOPICAL_CREAM | Freq: Two times a day (BID) | CUTANEOUS | 3 refills | Status: DC | PRN

## 2017-02-02 ENCOUNTER — Encounter (HOSPITAL_BASED_OUTPATIENT_CLINIC_OR_DEPARTMENT_OTHER): Payer: Self-pay | Admitting: Emergency Medicine

## 2017-02-02 ENCOUNTER — Emergency Department (HOSPITAL_BASED_OUTPATIENT_CLINIC_OR_DEPARTMENT_OTHER)
Admission: EM | Admit: 2017-02-02 | Discharge: 2017-02-02 | Disposition: A | Discharge status: Home / Self Care | Payer: Medicare Other | Attending: Emergency Medicine | Admitting: Emergency Medicine

## 2017-02-02 ENCOUNTER — Other Ambulatory Visit: Payer: Self-pay

## 2017-02-02 DIAGNOSIS — Z950 Presence of cardiac pacemaker: Secondary | ICD-10-CM | POA: Insufficient documentation

## 2017-02-02 DIAGNOSIS — Z79899 Other long term (current) drug therapy: Secondary | ICD-10-CM | POA: Diagnosis not present

## 2017-02-02 DIAGNOSIS — E039 Hypothyroidism, unspecified: Secondary | ICD-10-CM | POA: Insufficient documentation

## 2017-02-02 DIAGNOSIS — G20 Parkinson's disease: Secondary | ICD-10-CM | POA: Insufficient documentation

## 2017-02-02 DIAGNOSIS — I480 Paroxysmal atrial fibrillation: Secondary | ICD-10-CM | POA: Insufficient documentation

## 2017-02-02 DIAGNOSIS — M545 Low back pain, unspecified: Secondary | ICD-10-CM

## 2017-02-02 DIAGNOSIS — E1140 Type 2 diabetes mellitus with diabetic neuropathy, unspecified: Secondary | ICD-10-CM | POA: Diagnosis not present

## 2017-02-02 DIAGNOSIS — Z87891 Personal history of nicotine dependence: Secondary | ICD-10-CM | POA: Diagnosis not present

## 2017-02-02 DIAGNOSIS — Z7984 Long term (current) use of oral hypoglycemic drugs: Secondary | ICD-10-CM | POA: Insufficient documentation

## 2017-02-02 DIAGNOSIS — Z791 Long term (current) use of non-steroidal anti-inflammatories (NSAID): Secondary | ICD-10-CM | POA: Diagnosis not present

## 2017-02-02 MED ORDER — DEXAMETHASONE 4 MG PO TABS
ORAL_TABLET | ORAL | Status: AC
  Filled 2017-02-02: qty 1

## 2017-02-02 MED ORDER — IBUPROFEN 400 MG PO TABS
400.0000 mg | ORAL_TABLET | Freq: Once | ORAL | Status: AC
  Administered 2017-02-02: 400 mg via ORAL
  Filled 2017-02-02: qty 1

## 2017-02-02 MED ORDER — TRAMADOL HCL 50 MG PO TABS
50.0000 mg | ORAL_TABLET | Freq: Four times a day (QID) | ORAL | 0 refills | Status: DC | PRN
Start: 2017-02-02 — End: 2017-11-18

## 2017-02-02 MED ORDER — DEXAMETHASONE 4 MG PO TABS
8.0000 mg | ORAL_TABLET | Freq: Once | ORAL | Status: AC
Start: 2017-02-02 — End: 2017-02-02
  Administered 2017-02-02: 8 mg via ORAL
  Filled 2017-02-02: qty 2

## 2017-02-02 MED ORDER — HYDROCODONE-ACETAMINOPHEN 5-325 MG PO TABS
1.0000 | ORAL_TABLET | Freq: Once | ORAL | Status: DC
  Filled 2017-02-02: qty 1

## 2017-02-02 NOTE — ED Triage Notes (Signed)
L lower back pain x 1 week, non radiating. Denies injury.

## 2017-02-02 NOTE — ED Provider Notes (Signed)
[facility] EMERGENCY DEPARTMENT Provider Note   CSN: [id number] Arrival date & time: 02/02/17  1415     History   Chief Complaint Chief Complaint  Patient presents with  . Back Pain    HPI Stephen Robbins is a 81 y.o. male.  HPI   81 year old male with lower back pain.  Gradual onset several days ago.  Pain is in the left lower back.  Maybe radiates a little bit into the left buttock but not beyond.  No numbness or tingling.  He denies any significant changes activity or trauma.  No fevers or chills.  Denies any past history of back surgery.  Does have some problems with urinary urgency.  This is not acutely changed.  No urinary complaints otherwise.  He is followed by urology she has an appointment this upcoming Tuesday as well as with his PCP. He has little to no pain when he sitting straight upright and standing straight upright.  Pain is noticeable when supine and significantly worse with movement such as going to stand or twisting movements of the trunk.  400 mg of ibuprofen earlier this morning with some improvement.   Past Medical History:  Diagnosis Date  . Arthritis    "joints; a little" (01/04/2014)  . Asymptomatic bilateral carotid artery stenosis    per duplex  05-15-2012  left >39%/   right 40-59%  . Basal cell carcinoma    nose  . Borderline diabetes   . BPH (benign prostatic hypertrophy) with urinary obstruction   . Bradycardia   . Bronchial pneumonia 1958  . Chronotropic incompetence with sinus node dysfunction (HCC)   . Compressed cervical disc   . Compression of lumbar vertebra (HCC)    L4 -- L5  . Dizzy   . Dysmetabolic syndrome   . Fatigue   . Frequency of urination   . GERD (gastroesophageal reflux disease)    occasional  . Hemorrhoids   . Hyperlipidemia   . Hypothyroidism   . Nocturia   . NSVT (nonsustained ventricular tachycardia) [facility])    cardiologist-  dr croitoru  . Pacemaker   . Urgency of urination   . Wears glasses      Patient Active Problem List   Diagnosis Date Noted  . Paroxysmal atrial fibrillation ([place]) 02/23/2015  . Chronotropic incompetence with sinus node dysfunction (HCC)   . Pacemaker - [place] serial number [id number] 01/04/2014  . SSS (sick sinus syndrome) ([place]) 12/20/2013  . Chronotropic incompetence with autonomic dysfunction 12/20/2013  . Diabetic peripheral neuropathy ([place]) 06/17/2013  . Transaminasemia 06/17/2013  . Paralysis agitans ([place]) 06/17/2013  . Parkinson's disease ([place]) 06/17/2013  . NSVT (nonsustained ventricular tachycardia) ([place]) 12/18/2012  . Bradycardia 11/26/2012  . Dizziness, nonspecific 11/26/2012  . Cough 01/04/2012  . CARPAL TUNNEL SYNDROME, BILATERAL 01/23/2010  . HEARING LOSS 12/18/2009  . HYPOGLYCEMIA, UNSPECIFIED 10/10/2008  . Seasonal and perennial allergic rhinitis 03/26/2008  . UNS ADVRS EFF UNS RX MEDICINAL&BIOLOGICAL SBSTNC 09/17/2007  . HYPOGONADISM, MALE 04/23/2007  . DEPRESSION, MILD 02/10/2007  . BENIGN PROSTATIC HYPERTROPHY, WITH OBSTRUCTION 02/10/2007  . OSTEOARTHRITIS 02/10/2007  . LOW BACK PAIN 02/10/2007  . HYPERGLYCEMIA, BORDERLINE 02/10/2007  . Hypothyroidism 10/14/2006  . INTERNAL HEMORRHOIDS 10/14/2006  . Dyslipidemia 09/18/2006    Past Surgical History:  Procedure Laterality Date  . BASAL CELL CARCINOMA EXCISION     nose  . CLOSED REDUCTION NASAL FRACTURE  09-01-2007  . EXERCISE TOLERENCE TEST  12-03-2012  DR CROITURO   CHRONOTROPIC  INCOMPETENCE/ NORMAL RESTING BP W/ APPROPRIATE RESPONSE/ NO CHEST PAIN/ NO ST CHANGES FROM BASELINE  . INSERT / REPLACE / REMOVE PACEMAKER  01/04/2014   [employer] model L121 serial number [id number]  . LACERATION REPAIR Right 1978   middle finger  . NEUROPLASTY / TRANSPOSITION ULNAR NERVE AT ELBOW Left 02-09-2010  . TONSILLECTOMY AND ADENOIDECTOMY  1944  . ULNAR NERVE TRANSPOSITION         Home Medications    Prior to Admission medications    Medication Sig Start Date End Date Taking? Authorizing Provider  canagliflozin (INVOKANA) 300 MG TABS tablet TAKE ONE TABLET BY MOUTH DAILY BEFORE BREAKFAST 11/27/16   Marletta Lor, MD  carbidopa-levodopa (SINEMET IR) 25-100 MG tablet take 1 and 1/2 tablets by mouth three times a day before meals 08/06/16   [name], Rebecca S, DO  fish oil-omega-3 fatty acids 1000 MG capsule Take 2 g by mouth daily.    [provider]  gabapentin (NEURONTIN) 300 MG capsule Take 1 capsule (300 mg total) by mouth 2 (two) times daily. 08/02/16   [name], [name], DO  hydrocortisone cream 0.5 % Apply 1 application 2 (two) times daily as needed topically. rash 01/30/17   Marletta Lor, MD  ibuprofen (ADVIL,MOTRIN) 200 MG tablet Take 200 mg by mouth daily as needed for mild pain.     [provider]  levothyroxine (SYNTHROID, LEVOTHROID) 50 MCG tablet take 1 tablet by mouth once daily BEFORE BREAKFAST 03/15/16   Marletta Lor, MD  metFORMIN (GLUCOPHAGE-XR) 500 MG 24 hr tablet Take 2 tablets (1,000 mg total) by mouth daily with breakfast. 08/06/16   Marletta Lor, MD  mirabegron ER (MYRBETRIQ) 25 MG TB24 tablet Take 25 mg by mouth daily.    [provider]  Polyethyl Glycol-Propyl Glycol (SYSTANE ULTRA) 0.4-0.3 % SOLN Place 1 drop into both eyes daily as needed (dry eyes).     [provider]  polyethylene glycol powder (MIRALAX) powder Take 17 g by mouth daily. 01/06/17   Jola Schmidt, MD  Pramoxine-HC (HYDROCORTISONE ACE-PRAMOXINE) 2.5-1 % CREA Apply rectally twice daily as needed 07/08/16   Quintella Reichert, MD  sertraline (ZOLOFT) 25 MG tablet Take 1 tablet (25 mg total) by mouth every morning. Reported on 06/02/2015 08/06/16   Marletta Lor, MD  simvastatin (ZOCOR) 20 MG tablet take 1 tablet by mouth at bedtime 12/05/15   Marletta Lor, MD  vitamin B-12 (CYANOCOBALAMIN) 1000 MCG tablet Take 1,000 mcg by mouth every morning.     [provider]   XARELTO 20 MG TABS tablet TAKE 1 TABLET BY MOUTH DAILY 09/12/16   Croitoru, [name], MD    Family History Family History  Problem Relation Age of Onset  . Stroke Father   . Lung cancer Mother     Social History Social History   Tobacco Use  . Smoking status: Former Smoker    Packs/day: 1.00    Years: 10.00    Pack years: 10.00    Types: Cigarettes    Last attempt to quit: 03/25/1968    Years since quitting: 48.8  . Smokeless tobacco: Never Used  Substance Use Topics  . Alcohol use: Yes    Alcohol/week: 4.2 oz    Types: 7 Glasses of wine per week    Comment: 1 glass wine daily  . Drug use: No     Allergies   Penicillins   Review of Systems Review of Systems  All systems reviewed and negative,  other than as noted in HPI.  Physical Exam Updated Vital Signs BP 112/68 (BP Location: Right Arm)   Pulse 78   Temp 97.8 F (36.6 C) (Oral)   Resp 20   Ht 5\' 9"  (1.753 m)   Wt 77.1 kg (170 lb)   SpO2 98%   BMI 25.10 kg/m   Physical Exam   ED Treatments / Results  Labs (all labs ordered are listed, but only abnormal results are displayed) Labs Reviewed - No data to display  EKG  EKG Interpretation None       Radiology No results found.  Procedures Procedures (including critical care time)  Medications Ordered in ED Medications  dexamethasone (DECADRON) tablet 8 mg (not administered)  HYDROcodone-acetaminophen (NORCO/VICODIN) 5-325 MG per tablet 1 tablet (not administered)  ibuprofen (ADVIL,MOTRIN) tablet 400 mg (not administered)     Initial Impression / Assessment and Plan / ED Course  I have reviewed the triage vital signs and the nursing notes.  Pertinent labs & imaging results that were available during my care of the patient were reviewed by me and considered in my medical decision making (see chart for details).     81 year old male with atraumatic lower back pain.  No red flags.  No indication for emergent imaging at this time.  Plan  symptomatic treatment. With the long half life, he was given a single dose of decadron in the ED. Normal renal function on previous labs although from about a year ago. I think reasonable to continue ibuprofen 400mg  ibuprofen as needed for the next few days.  Tramadol for break through pain. Hopefully will be feeling better at the time of his appointment with his PCP. If not, he can discuss further.  Return precautions were discussed.  Final Clinical Impressions(s) / ED Diagnoses   Final diagnoses:  Acute left-sided low back pain without sciatica    ED Discharge Orders    None       Virgel Manifold, MD 02/02/17 [id number]

## 2017-02-02 NOTE — Discharge Instructions (Signed)
You were given a dose of decadron (steroid)  in the emergency room today.  This medication has a long half-life and you will be effectively be treated with it for several days.  You may continue to take 400 mg of ibuprofen every 6 hours as needed for pain.  Take tramadol as needed for breakthrough pain.  If you have continued symptoms then discuss further with your PCP on your appointment on Tuesday.

## 2017-02-04 ENCOUNTER — Ambulatory Visit (INDEPENDENT_AMBULATORY_CARE_PROVIDER_SITE_OTHER): Payer: Medicare Other | Admitting: Internal Medicine

## 2017-02-04 ENCOUNTER — Encounter: Payer: Self-pay | Admitting: Internal Medicine

## 2017-02-04 VITALS — BP 120/64 | HR 75 | Temp 97.7°F | Ht 69.0 in | Wt 176.4 lb

## 2017-02-04 DIAGNOSIS — R351 Nocturia: Secondary | ICD-10-CM | POA: Diagnosis not present

## 2017-02-04 DIAGNOSIS — E1142 Type 2 diabetes mellitus with diabetic polyneuropathy: Secondary | ICD-10-CM | POA: Diagnosis not present

## 2017-02-04 DIAGNOSIS — G20 Parkinson's disease: Secondary | ICD-10-CM | POA: Diagnosis not present

## 2017-02-04 DIAGNOSIS — R3915 Urgency of urination: Secondary | ICD-10-CM | POA: Diagnosis not present

## 2017-02-04 DIAGNOSIS — E039 Hypothyroidism, unspecified: Secondary | ICD-10-CM

## 2017-02-04 DIAGNOSIS — Z950 Presence of cardiac pacemaker: Secondary | ICD-10-CM

## 2017-02-04 DIAGNOSIS — N401 Enlarged prostate with lower urinary tract symptoms: Secondary | ICD-10-CM | POA: Diagnosis not present

## 2017-02-04 LAB — LIPID PANEL
Cholesterol: 171 mg/dL (ref 0–200)
HDL: 37.9 mg/dL — ABNORMAL LOW (ref >39.00)
LDL Cholesterol: 107 mg/dL — ABNORMAL HIGH (ref 0–99)
NonHDL: 132.8
Total CHOL/HDL Ratio: 5
Triglycerides: 128 mg/dL (ref 0.0–149.0)
VLDL: 25.6 mg/dL (ref 0.0–40.0)

## 2017-02-04 LAB — COMPREHENSIVE METABOLIC PANEL
ALT: 10 U/L (ref 0–53)
AST: 19 U/L (ref 0–37)
Albumin: 4.3 g/dL (ref 3.5–5.2)
Alkaline Phosphatase: 39 U/L (ref 39–117)
BUN: 22 mg/dL (ref 6–23)
CO2: 28 mEq/L (ref 19–32)
Calcium: 9.5 mg/dL (ref 8.4–10.5)
Chloride: 100 mEq/L (ref 96–112)
Creatinine, Ser: 0.79 mg/dL (ref 0.40–1.50)
GFR: 100.42 mL/min (ref >60.00)
Glucose, Bld: 109 mg/dL — ABNORMAL HIGH (ref 70–99)
Potassium: 3.8 mEq/L (ref 3.5–5.1)
Sodium: 136 mEq/L (ref 135–145)
Total Bilirubin: 0.9 mg/dL (ref 0.2–1.2)
Total Protein: 6.9 g/dL (ref 6.0–8.3)

## 2017-02-04 LAB — CBC WITH DIFFERENTIAL/PLATELET
Basophils Absolute: 0 10*3/uL (ref 0.0–0.1)
Basophils Relative: 0.6 % (ref 0.0–3.0)
Eosinophils Absolute: 0.1 10*3/uL (ref 0.0–0.7)
Eosinophils Relative: 0.8 % (ref 0.0–5.0)
HCT: 45.3 % (ref 39.0–52.0)
Hemoglobin: 15.5 g/dL (ref 13.0–17.0)
Lymphocytes Relative: 34.8 % (ref 12.0–46.0)
Lymphs Abs: 2.7 10*3/uL (ref 0.7–4.0)
MCHC: 34.2 g/dL (ref 30.0–36.0)
MCV: 103.5 fl — ABNORMAL HIGH (ref 78.0–100.0)
Monocytes Absolute: 0.8 10*3/uL (ref 0.1–1.0)
Monocytes Relative: 10.2 % (ref 3.0–12.0)
Neutro Abs: 4.2 10*3/uL (ref 1.4–7.7)
Neutrophils Relative %: 53.6 % (ref 43.0–77.0)
Platelets: 201 10*3/uL (ref 150.0–400.0)
RBC: 4.37 Mil/uL (ref 4.22–5.81)
RDW: 13.4 % (ref 11.5–15.5)
WBC: 7.8 10*3/uL (ref 4.0–10.5)

## 2017-02-04 LAB — MICROALBUMIN / CREATININE URINE RATIO
Creatinine,U: 102.8 mg/dL
Microalb Creat Ratio: 1.8 mg/g (ref 0.0–30.0)
Microalb, Ur: 1.8 mg/dL (ref 0.0–1.9)

## 2017-02-04 LAB — TSH: TSH: 8.33 u[IU]/mL — ABNORMAL HIGH (ref 0.35–4.50)

## 2017-02-04 LAB — HEMOGLOBIN A1C: Hgb A1c MFr Bld: 6.7 % — ABNORMAL HIGH (ref 4.6–6.5)

## 2017-02-04 MED ORDER — SIMVASTATIN 20 MG PO TABS: 20.0000 mg | ORAL_TABLET | Freq: Every day | ORAL | 3 refills | Status: DC

## 2017-02-04 MED ORDER — POLYETHYLENE GLYCOL 3350 17 GM/SCOOP PO POWD: 17.0000 g | Freq: Every day | ORAL | 0 refills | Status: DC

## 2017-02-04 MED ORDER — HYDROCORTISONE ACE-PRAMOXINE 2.5-1 % EX CREA: TOPICAL_CREAM | CUTANEOUS | 0 refills | Status: DC

## 2017-02-04 MED ORDER — LEVOTHYROXINE SODIUM 50 MCG PO TABS: ORAL_TABLET | ORAL | 3 refills | Status: DC

## 2017-02-04 MED ORDER — CANAGLIFLOZIN 300 MG PO TABS: ORAL_TABLET | ORAL | 2 refills | Status: DC

## 2017-02-04 NOTE — Patient Instructions (Signed)
Limit your sodium (Salt) intake   Please check your hemoglobin A1c every 3-6  Months  Neurology follow-up as scheduled

## 2017-02-04 NOTE — Progress Notes (Signed)
Subjective:    Patient ID: Stephen Robbins, male    DOB: 02-27-1936, 81 y.o.   MRN: [id number]  HPI  Lab Results  Component Value Date   HGBA1C 7.3 08/06/2016    81 year old patient who is seen today in follow-up.   He is followed by neurology with PD.  Recently he has had 2 ED admissions for severe constipation.  This has improved with daily MiraLAX.  He has a history of hypothyroidism osteoarthritis and diabetes.  He states blood sugars have been under reasonable control.  He is status post pacemaker for sick sinus syndrome.  He has hypothyroidism and remains on levothyroxine supplementation.  Remains on chronic anticoagulation due to atrial fibrillation He is scheduled to see neurology soon as well as urology for incontinence.   Past Medical History:  Diagnosis Date  . Arthritis    "joints; a little" (01/04/2014)  . Asymptomatic bilateral carotid artery stenosis    per duplex  05-15-2012  left >39%/   right 40-59%  . Basal cell carcinoma    nose  . Borderline diabetes   . BPH (benign prostatic hypertrophy) with urinary obstruction   . Bradycardia   . Bronchial pneumonia 1958  . Chronotropic incompetence with sinus node dysfunction (HCC)   . Compressed cervical disc   . Compression of lumbar vertebra (HCC)    L4 -- L5  . Dizzy   . Dysmetabolic syndrome   . Fatigue   . Frequency of urination   . GERD (gastroesophageal reflux disease)    occasional  . Hemorrhoids   . Hyperlipidemia   . Hypothyroidism   . Nocturia   . NSVT (nonsustained ventricular tachycardia) [facility])    cardiologist-  dr croitoru  . Pacemaker   . Urgency of urination   . Wears glasses      Social History   Socioeconomic History  . Marital status: Married    Spouse name: Not on file  . Number of children: 0  . Years of education: Not on file  . Highest education level: Not on file  Social Needs  . Financial resource strain: Not on file  . Food insecurity - worry: Not on file  . Food insecurity  - inability: Not on file  . Transportation needs - medical: Not on file  . Transportation needs - non-medical: Not on file  Occupational History  . Occupation: retired    Fish farm manager: RETIRED    Comment: executive   Tobacco Use  . Smoking status: Former Smoker    Packs/day: 1.00    Years: 10.00    Pack years: 10.00    Types: Cigarettes    Last attempt to quit: 03/25/1968    Years since quitting: 48.8  . Smokeless tobacco: Never Used  Substance and Sexual Activity  . Alcohol use: Yes    Alcohol/week: 4.2 oz    Types: 7 Glasses of wine per week    Comment: 1 glass wine daily  . Drug use: No  . Sexual activity: Not on file  Other Topics Concern  . Not on file  Social History Narrative   Regular exercise          Past Surgical History:  Procedure Laterality Date  . BASAL CELL CARCINOMA EXCISION     nose  . CLOSED REDUCTION NASAL FRACTURE  09-01-2007  . EXERCISE TOLERENCE TEST  12-03-2012  DR CROITURO   CHRONOTROPIC INCOMPETENCE/ NORMAL RESTING BP W/ APPROPRIATE RESPONSE/ NO CHEST PAIN/ NO ST CHANGES FROM BASELINE  .  INSERT / REPLACE / REMOVE PACEMAKER  01/04/2014   [employer] model L121 serial number [id number]  . LACERATION REPAIR Right 1978   middle finger  . NEUROPLASTY / TRANSPOSITION ULNAR NERVE AT ELBOW Left 02-09-2010  . TONSILLECTOMY AND ADENOIDECTOMY  1944  . ULNAR NERVE TRANSPOSITION      Family History  Problem Relation Age of Onset  . Stroke Father   . Lung cancer Mother     Allergies  Allergen Reactions  . Penicillins Other (See Comments)    Unknown childhood reaction    Current Outpatient Medications on File Prior to Visit  Medication Sig Dispense Refill  . carbidopa-levodopa (SINEMET IR) 25-100 MG tablet take 1 and 1/2 tablets by mouth three times a day before meals 405 tablet 1  . fish oil-omega-3 fatty acids 1000 MG capsule Take 2 g by mouth daily.    Marland Kitchen gabapentin (NEURONTIN) 300 MG capsule Take 1 capsule (300 mg total) by mouth 2  (two) times daily. 180 capsule 1  . hydrocortisone cream 0.5 % Apply 1 application 2 (two) times daily as needed topically. rash 30 g 3  . ibuprofen (ADVIL,MOTRIN) 200 MG tablet Take 200 mg by mouth daily as needed for mild pain.     . metFORMIN (GLUCOPHAGE-XR) 500 MG 24 hr tablet Take 2 tablets (1,000 mg total) by mouth daily with breakfast. 180 tablet 5  . mirabegron ER (MYRBETRIQ) 25 MG TB24 tablet Take 25 mg by mouth daily.    Vladimir Faster Glycol-Propyl Glycol (SYSTANE ULTRA) 0.4-0.3 % SOLN Place 1 drop into both eyes daily as needed (dry eyes).     . sertraline (ZOLOFT) 25 MG tablet Take 1 tablet (25 mg total) by mouth every morning. Reported on 06/02/2015 30 tablet 5  . traMADol (ULTRAM) 50 MG tablet Take 1 tablet (50 mg total) every 6 (six) hours as needed by mouth. 12 tablet 0  . vitamin B-12 (CYANOCOBALAMIN) 1000 MCG tablet Take 1,000 mcg by mouth every morning.     Alveda Reasons 20 MG TABS tablet TAKE 1 TABLET BY MOUTH DAILY 90 tablet 1   No current facility-administered medications on file prior to visit.     BP 120/64 (BP Location: Left Arm, Patient Position: Sitting, Cuff Size: Normal)   Pulse 75   Temp 97.7 F (36.5 C) (Oral)   Ht 5\' 9"  (1.753 m)   Wt 176 lb 6.4 oz (80 kg)   SpO2 97%   BMI 26.05 kg/m    Review of Systems  Constitutional: Negative for appetite change, chills, fatigue and fever.  HENT: Negative for congestion, dental problem, ear pain, hearing loss, sore throat, tinnitus, trouble swallowing and voice change.   Eyes: Negative for pain, discharge and visual disturbance.  Respiratory: Negative for cough, chest tightness, wheezing and stridor.   Cardiovascular: Negative for chest pain, palpitations and leg swelling.  Gastrointestinal: Positive for constipation. Negative for abdominal distention, abdominal pain, blood in stool, diarrhea, nausea and vomiting.  Genitourinary: Positive for urgency. Negative for difficulty urinating, discharge, flank pain, genital sores  and hematuria.       Incontinence  Musculoskeletal: Positive for back pain and gait problem. Negative for arthralgias, joint swelling, myalgias and neck stiffness.  Skin: Negative for rash.  Neurological: Negative for dizziness, syncope, speech difficulty, weakness, numbness and headaches.  Hematological: Negative for adenopathy. Does not bruise/bleed easily.  Psychiatric/Behavioral: Negative for behavioral problems and dysphoric mood. The patient is not nervous/anxious.        Objective:  Physical Exam  Constitutional: He is oriented to person, place, and time. He appears well-developed.  Blood pressure well controlled  HENT:  Head: Normocephalic.  Right Ear: External ear normal.  Left Ear: External ear normal.  Eyes: Conjunctivae and EOM are normal.  Neck: Normal range of motion.  Cardiovascular: Normal rate and normal heart sounds.  Pulmonary/Chest: Breath sounds normal.  Abdominal: Bowel sounds are normal.  Musculoskeletal: Normal range of motion. He exhibits no edema or tenderness.  Neurological: He is alert and oriented to person, place, and time.  Psychiatric: He has a normal mood and affect. His behavior is normal.          Assessment & Plan:   PD.  Follow-up neurology Chronic constipation.  Measures discussed.  Will continue MiraLAX which has been quite helpful BPH urge incontinence.  Follow-up urology Diabetes mellitus.  Will review hemoglobin A1c and general lab  Follow-up 4 months  [id number],PETER Pilar Plate

## 2017-02-05 MED ORDER — LEVOTHYROXINE SODIUM 75 MCG PO TABS: 75.0000 ug | ORAL_TABLET | Freq: Every day | ORAL | 3 refills | Status: DC

## 2017-02-05 NOTE — Addendum Note (Signed)
Addended by: Abelardo Diesel on: 02/05/2017 09:38 AM   Modules accepted: Orders

## 2017-02-06 ENCOUNTER — Ambulatory Visit: Payer: Medicare Other | Admitting: Neurology

## 2017-02-06 NOTE — Progress Notes (Signed)
Stephen Robbins was seen today in f/u in the movement clinic.  He was dx with PD last visit, on 06/17/13.  He is on carbidopa/levodopa 25/100 tid.  He has no side effects with the carbidopa/levodopa, but states that "I just don't know what to expect."  He continues to have tremor, which can be bothersome for him.  He has had no falls.  No lightheadedness.  No near syncope.  No hallucinations.  He had a carotid ultrasound done since last visit.  There is 1-39% stenosis bilaterally.  He did have some lab work done since last visit.  His hemoglobin A1c has greatly improved.  It is 6.3 (was 7.9 and prior to that was 9.4). He just finished PT and is enrolled in the exercise class now.  He presents today with a long list of questions.  He again tape records our session.    11/19/13 update:  Pt is f/u regarding Parkinson's disease.  He is currently on carbidopa/levodopa 25/100, one tablet 3 times per day. He is accompanied by his wife, who supplements the history.   Patient denies any falls.  He denies hallucinations.  Denies nausea or vomiting.  No lightheadedness or near syncope.  He is exercising.  He still has tremor and isn't sure that the levodopa helps tremor.  He sent me several e-mails since last visit.  One of these was just a TV news link regarding [facility] high-resolution imaging and deep brain stimulation.  He subsequently sent another e-mail regarding focused ultrasound.  Asks about updates regarding these and has 2 pages of questions.  Asks about why we can't give him human growth hormone or steroids for PD.  Overall, patient is clinically doing well.   02/21/14 update:   Pt returns for f/u, accompanied by his wife who supplements the history.  The records that were made available to me were reviewed.  Pt has had PPM since last visit due to bradycardia.  He has been told not to exercise until 12/9.  He will start with PWR moves classes after the first of the year as well as circuit 2 classes.  He is still  on carbidopa/levodopa 25/100 at 8-9am/12-1pm/6-7 pm.  He is c/o paresthesias/"neuropathy" in the R foot.  He only notices it with driving.   He has no back pain.  He has no sensation of balance loss with closing eyes in the shower.  He thinks that exposure to levodopa causes the neuropathy and brings with him a small study (58 pts) that suggests that levodopa could be an etiology for the neuropathy.  Notices lack of smell.  No falls.  Rare lightheadedness. No hallucinations.    05/23/14 update:  Pt is f/u today, accompanied by his wife who supplements the history.  [facility] since our [facility] visit and I reviewed those records.  He was told he had PN (known diagnosis) but no new meds were prescribed.  He asks about a "cure" for PN that a doctor in [place] was advertising.   He remains on carbidopa/levodopa 25/100 tid for the PD.  He is exercising faithfully.  He has trouble getting himself over 3 miles per hour when walking and he is concerned about that.  He asks again about taking steroids and human growth hormone.  He also asks about taking protein supplements.  He has d/c the glucosamine for joint pain/arthritis as he didn't think that it was helpful.    09/08/14 update:  The patient  is following up today.  He is accompanied by his wife who supplements the history.   Records were reviewed since last visit, from his primary care physician, podiatrist, as well as his cardiologist.  He remains on carbidopa/levodopa 25/100, one tablet 3 times per day.  He notices no wearing off.  He is doing PD circuit class.  He called his podiatrist since last visit and asked him about medications for neuropathy and he deferred to me.  We started gabapentin on May 30, 2014.  He is on 300 mg twice a day.  He states that his feet numbness is better.  He has continued to complain about fatigue.  His pacemaker was recently adjusted.  He is trying to walk/exercise at the park and his endurance has been better ever  since.  He again asks about energy.  He is back on testosterone for that.    01/12/15 update:  The patient returns today for follow-up, accompanied by his wife who supplements the history.  He remains on carbidopa/levodopa 25/100, one tablet 3 times per day.  Overall, the patient states that he is doing well.   Still has some intermittent tremor.  He denies any syncope.  He has had no falls since last visit.  He has had some near falls.  No hallucinations or visual distortions.  He is on gabapentin, 300 mg twice a day for peripheral neuropathy secondary to diabetes mellitus. Pt states that it is helping with the sx's, along with the "natural herb that I take."   He continues to faithfully exercise with the PWR classes.  Some lightheaded after exercise.  No new medical issues since last visit.    04/18/15 update:  The patient presents today, accompanied by his wife who supplements the history.  Last visit, increased the patient's carbidopa/levodopa 25/100, so that he is now taking 1-1/2 tablets 3 times per day.   When asked how he feel, he states, "I don't know how I am supposed to feel."  He denies stiffness.  He has minimal thumb tremor.   He is supposed to be on gabapentin, 300 mg twice a day for peripheral neuropathy but went down to q day because his feet weren't bothering him.  I have reviewed records made available to me since last visit.  He was diagnosed with atrial fibrillation and started on Xarelto.  He is doing well with that.  He denies any bleeding difficulties.  No falls.  No near syncope.  Rarely does he even feel unstable.  He is exercising.  Goes to the gym three times a week.    08/22/15 update:  The patient presents today, accompanied by his wife who supplements the history. He is on carbidopa/levodopa 25/100, 1-1/2 tablets 3 times per day.  He remains on gabapentin, 300 mg once a day for peripheral neuropathy (7-9pm). He states that he doesn't think that he can come off of it, at least until  he moves.  He did come to the PD support group lecture that I gave 2 weeks ago.  He denies hallucinations, lightheadedness, near syncope.  He does state that they are moving to [place] in assisted living.  States that there has been some stress with moving, as he is having to fix a hole that was discovered in the bathroom floor.   Asks me about alpha-lipoic acid and states that the people who sell it to him "think that it is good."    12/26/15 update:  The patient follows up today, accompanied  by his wife who supplements the history.  He remains on carbidopa/levodopa 25/100, 1-1/2 tablets 3 times per day.  He is still on gabapentin, 300 mg at night for neuropathy.  States that he doesn't want to try and d/c that.    He has since moved to [place] and they are adjusting well.  They have sold their house.  He is doing a [employer] class.  No falls.  Some near falls.    04/29/16 update:  Patient follows up today, accompanied by his wife who supplements the history.  Patient is on carbidopa/levodopa 25/100, 1.5 tablets 3 times per day (8am/noon/5pm).  Pt denies falls.  Pt denies lightheadedness, near syncope.  No hallucinations.  Mood has been "up and down."  Occasional bouts of constipation vs diarrhea.   On gabapentin 300 q hs for PN.  Did OT/ST since our last visit and reviewed those notes.  Doing PWR moves classes on wednesdays.    09/03/16 update:  Patient seen today in follow-up, accompanied by his wife who supplements the history.  Patient remains on carbidopa/levodopa 25/100 25/100, 1-1/2 tablets at 8 AM/noon/5 PM.  He has had no falls.  No lightheadedness or near syncope.  No hallucinations.  Remains on gabapentin, but now on 300 mg bid.  Seems to be doing better with the neuropathic sx's now.  The records that were made available to me were reviewed.  Metformin started by pcp as HgBA1C was rising.  Has some constipation.  He pulled me to the side and states that he is slightly more depressed dealing with  his wifes alzheimers.  No SI/HI.    02/07/17 update: Patient is seen today in follow-up.  He is accompanied by his wife who supplements the history.  Patient is on carbidopa/levodopa 25/100, 1-1/2 tablets 3 times per day.  Patient has had no falls since last visit.  No hallucinations.  He is on gabapentin, 300 mg twice per day for paresthesias.  This seems to be helping and is well controlled and he doesn't even always take the evening gabapentin dosage.  He has had no lightheadedness or near syncope.  He continues to have some stress associated with caregiving for his wife who has dementia.  Records have been reviewed since last visit.  He was in the emergency room twice since last visit, once for constipation and once for low back pain.  When asked how much water he was drinking he stated "obviously not enough."  He has not tried the rancho recipe and doesn't remember that I gave it to him.  Asks for another copy.  He had labwork few days ago and reports that his thyroid medication was adjusted to account for high TSH.    No neuroimaging since CT brain in 2009.  PREVIOUS MEDICATIONS: none to date  ALLERGIES:   Allergies  Allergen Reactions  . Penicillins Other (See Comments)    Unknown childhood reaction    CURRENT MEDICATIONS:  Current Outpatient Medications on File Prior to Visit  Medication Sig Dispense Refill  . bisacodyl (DULCOLAX) 5 MG EC tablet Take 5 mg daily as needed by mouth for moderate constipation.    . canagliflozin (INVOKANA) 300 MG TABS tablet TAKE ONE TABLET BY MOUTH DAILY BEFORE BREAKFAST 90 tablet 2  . carbidopa-levodopa (SINEMET IR) 25-100 MG tablet take 1 and 1/2 tablets by mouth three times a day before meals 405 tablet 1  . fish oil-omega-3 fatty acids 1000 MG capsule Take 2 g by mouth daily.    Marland Kitchen  gabapentin (NEURONTIN) 300 MG capsule Take 1 capsule (300 mg total) by mouth 2 (two) times daily. 180 capsule 1  . hydrocortisone cream 0.5 % Apply 1 application 2 (two)  times daily as needed topically. rash 30 g 3  . ibuprofen (ADVIL,MOTRIN) 200 MG tablet Take 200 mg by mouth daily as needed for mild pain.     Marland Kitchen levothyroxine (SYNTHROID, LEVOTHROID) 75 MCG tablet Take 1 tablet (75 mcg total) daily by mouth. 90 tablet 3  . metFORMIN (GLUCOPHAGE-XR) 500 MG 24 hr tablet Take 2 tablets (1,000 mg total) by mouth daily with breakfast. 180 tablet 5  . mirabegron ER (MYRBETRIQ) 25 MG TB24 tablet Take 25 mg by mouth daily.    Vladimir Faster Glycol-Propyl Glycol (SYSTANE ULTRA) 0.4-0.3 % SOLN Place 1 drop into both eyes daily as needed (dry eyes).     . polyethylene glycol powder (MIRALAX) powder Take 17 g daily by mouth. 255 g 0  . Pramoxine-HC (HYDROCORTISONE ACE-PRAMOXINE) 2.5-1 % CREA Apply rectally twice daily as needed 28.35 g 0  . sertraline (ZOLOFT) 25 MG tablet Take 1 tablet (25 mg total) by mouth every morning. Reported on 06/02/2015 30 tablet 5  . simvastatin (ZOCOR) 20 MG tablet Take 1 tablet (20 mg total) at bedtime by mouth. 90 tablet 3  . traMADol (ULTRAM) 50 MG tablet Take 1 tablet (50 mg total) every 6 (six) hours as needed by mouth. 12 tablet 0  . vitamin B-12 (CYANOCOBALAMIN) 1000 MCG tablet Take 1,000 mcg by mouth every morning.     Alveda Reasons 20 MG TABS tablet TAKE 1 TABLET BY MOUTH DAILY 90 tablet 1   No current facility-administered medications on file prior to visit.     PAST MEDICAL HISTORY:   Past Medical History:  Diagnosis Date  . Arthritis    "joints; a little" (01/04/2014)  . Asymptomatic bilateral carotid artery stenosis    per duplex  05-15-2012  left >39%/   right 40-59%  . Basal cell carcinoma    nose  . Borderline diabetes   . BPH (benign prostatic hypertrophy) with urinary obstruction   . Bradycardia   . Bronchial pneumonia 1958  . Chronotropic incompetence with sinus node dysfunction (HCC)   . Compressed cervical disc   . Compression of lumbar vertebra (HCC)    L4 -- L5  . Dizzy   . Dysmetabolic syndrome   . Fatigue   .  Frequency of urination   . GERD (gastroesophageal reflux disease)    occasional  . Hemorrhoids   . Hyperlipidemia   . Hypothyroidism   . Nocturia   . NSVT (nonsustained ventricular tachycardia) [facility])    cardiologist-  dr croitoru  . Pacemaker   . Urgency of urination   . Wears glasses     PAST SURGICAL HISTORY:   Past Surgical History:  Procedure Laterality Date  . BASAL CELL CARCINOMA EXCISION     nose  . CLOSED REDUCTION NASAL FRACTURE  09-01-2007  . CYSTOSCOPY N/A 12/28/2012   [facility] Rana Snare[facility] at [facility]  . EXERCISE TOLERENCE TEST  12-03-2012  DR CROITURO   CHRONOTROPIC INCOMPETENCE/ NORMAL RESTING BP W/ APPROPRIATE RESPONSE/ NO CHEST PAIN/ NO ST CHANGES FROM BASELINE  . INSERT / REPLACE / REMOVE PACEMAKER  01/04/2014   [employer] model L121 serial number [id number]  . LACERATION REPAIR Right 1978   middle finger  . NEUROPLASTY / TRANSPOSITION ULNAR NERVE AT ELBOW Left 02-09-2010  . PERMANENT PACEMAKER INSERTION N/A  01/04/2014   Performed by Sanda Klein, MD at [facility] CATH LAB  . TONSILLECTOMY AND ADENOIDECTOMY  1944  . TRANSURETHRAL INCISION OF THE PROSTATE (TUIP) N/A 12/28/2012   Performed by Rana Snare, MD at [facility]  . ULNAR NERVE TRANSPOSITION      SOCIAL HISTORY:   Social History   Socioeconomic History  . Marital status: Married    Spouse name: Not on file  . Number of children: 0  . Years of education: Not on file  . Highest education level: Not on file  Social Needs  . Financial resource strain: Not on file  . Food insecurity - worry: Not on file  . Food insecurity - inability: Not on file  . Transportation needs - medical: Not on file  . Transportation needs - non-medical: Not on file  Occupational History  . Occupation: retired    Fish farm manager: RETIRED    Comment: executive   Tobacco Use  . Smoking status: Former Smoker    Packs/day: 1.00    Years: 10.00    Pack years: 10.00     Types: Cigarettes    Last attempt to quit: 03/25/1968    Years since quitting: 48.9  . Smokeless tobacco: Never Used  Substance and Sexual Activity  . Alcohol use: Yes    Alcohol/week: 4.2 oz    Types: 7 Glasses of wine per week    Comment: 1 glass wine daily  . Drug use: No  . Sexual activity: Not on file  Other Topics Concern  . Not on file  Social History Narrative   Regular exercise          FAMILY HISTORY:   Family Status  Relation Name Status  . Father  Deceased       stroke  . Mother  Deceased       lung cancer  . Brother  Alive       healthy    ROS:    A complete 10 system review of systems was obtained and was unremarkable apart from what is mentioned above.  PHYSICAL EXAMINATION:    VITALS:   Vitals:   02/07/17 1506  BP: 124/76  Pulse: (!) 102  SpO2: 92%  Weight: 172 lb (78 kg)  Height: '5\' 9"'$  (1.753 m)   Wt Readings from Last 3 Encounters:  02/07/17 172 lb (78 kg)  02/04/17 176 lb 6.4 oz (80 kg)  02/02/17 170 lb (77.1 kg)     GEN:  The patient appears stated age and is in NAD. HEENT:  [place]/AT.  MMM CV:  RRR (no afib today) Lungs:  CTAB Neck:  No bruits   Neurological examination:  Orientation: The patient is alert and oriented x3. Fund of knowledge is appropriate.  Recent and remote memory are intact.  Cranial nerves: There is good facial symmetry. Mild facial hypomimia.  Pupils are equal round and reactive to light bilaterally. Fundoscopic exam reveals clear margins bilaterally. Extraocular muscles are intact. The visual fields are full to confrontational testing. The speech is fluent and clear.  Soft palate rises symmetrically and there is no tongue deviation. Hearing is intact to conversational tone. Motor: Strength is 5/5 in the bilateral upper and lower extremities.   Shoulder shrug is equal and symmetric.  There is no pronator drift. Sensation:  Intact to light touch.  More sensitive aspects not tested today  Movement examination: Tone:  There is very mild rigidity in the RUE.  It is good elsewhere Abnormal movements: There  is very rare RUE tremor Coordination:  There are decreased RAM's only in the feet and present with toe taps bilaterally Gait and Station: The patient has no difficulty arising out of a deep-seated chair without the use of the hands. The patient's stride length is fairly normal with better arm swing.  Negative pull test  LABS:  Lab Results  Component Value Date   TSH 8.33 (H) 02/04/2017   Lab Results  Component Value Date   VITAMINB12 872 06/17/2013     Chemistry      Component Value Date/Time   NA 136 02/04/2017 1207   K 3.8 02/04/2017 1207   CL 100 02/04/2017 1207   CO2 28 02/04/2017 1207   BUN 22 02/04/2017 1207   CREATININE 0.79 02/04/2017 1207   CREATININE 0.77 12/29/2013 1627      Component Value Date/Time   CALCIUM 9.5 02/04/2017 1207   ALKPHOS 39 02/04/2017 1207   AST 19 02/04/2017 1207   ALT 10 02/04/2017 1207   BILITOT 0.9 02/04/2017 1207     RPR negative Lab Results  Component Value Date   FOLATE >20.0 06/17/2013     Lab Results  Component Value Date   HGBA1C 6.7 (H) 02/04/2017    ASSESSMENT/PLAN:  1. idiopathic Parkinson's disease.  I see no atypical features.    -He will continue the carbidopa/levodopa 25/100, 1.5 tablets tid.  Risks, benefits, side effects and alternative therapies were discussed.  The opportunity to ask questions was given and they were answered to the best of my ability.  The patient expressed understanding and willingness to follow the outlined treatment protocols. 2.  Peripheral neuropathy  -There is evidence of this on examination.  Likely from DM.  His workup for reversible causes was negative.  His diabetes is under much better control than in the past.  -improved on gabapentin 300 mg qday to bid.   3.  Hx of transaminasemia  -These were elevated in 2012 but have improved 4.  R carotid bruit with hx of carotid stenosis  -04/2012 u/s with  40-50% stenosis on the R.  this was rechecked in 2015 and was 1-39% bilaterally. 5.  Constipation  -Was in the emergency room for this again since last visit.  Given a copy of the rancho recipe again.   Talked again about the importance of hydration.  Talked about what exactly this means.  Having some bladder issues as well and increasing hydration may make worse.  Is already seeing Dr. Risa Grill. 6.  Depression  -associated with caregiving for wife.  Doing better.    -met with our new social worker today  -discussed resources here and at [place] [facility]. Follow up is anticipated in the next 4 months, sooner should new neurologic issues arise.  Much greater than 50% of this visit was spent in counseling and coordinating care.  Total face to face time:  25 min

## 2017-02-07 ENCOUNTER — Encounter: Payer: Self-pay | Admitting: Neurology

## 2017-02-07 ENCOUNTER — Encounter: Payer: Self-pay | Admitting: Psychology

## 2017-02-07 ENCOUNTER — Ambulatory Visit (INDEPENDENT_AMBULATORY_CARE_PROVIDER_SITE_OTHER): Payer: Medicare Other | Admitting: Neurology

## 2017-02-07 VITALS — BP 124/76 | HR 102 | Ht 69.0 in | Wt 172.0 lb

## 2017-02-07 DIAGNOSIS — G20 Parkinson's disease: Secondary | ICD-10-CM

## 2017-02-07 DIAGNOSIS — E1142 Type 2 diabetes mellitus with diabetic polyneuropathy: Secondary | ICD-10-CM

## 2017-02-07 DIAGNOSIS — K5901 Slow transit constipation: Secondary | ICD-10-CM

## 2017-02-07 NOTE — Patient Instructions (Signed)
Constipation and Parkinson's disease:  1.Rancho recipe for constipation in Parkinsons Disease:  -1 cup of unprocessed bran (need to get this at [employer], [employer] or similar type of store), 2 cups of applesauce in 1 cup of prune juice 2.  Increase fiber intake (Metamucil,vegetables) 3.  Regular, moderate exercise can be beneficial. 4.  Avoid medications causing constipation, such as medications like antacids with calcium or magnesium 5.  Laxative overuse should be avoided. 6.  Stool softeners (Colace) can help with chronic constipation. 7.  Increase water intake.  You should be drinking 1/2 gallon of water a day as long as you have not been diagnosed with congestive heart failure or renal/kidney failure.  This is probably the single greatest thing that you can do to help your constipation.  

## 2017-02-07 NOTE — Progress Notes (Signed)
I met with Mr. Stephen Robbins bolds his wife while they were in the clinic today. We talked a little about his interest in becoming more involved in [place] and accessing some of their resources. He is interested in getting more services available in his area. I shared that I am meeting with Pan at [facility] in a few weeks and we will be talking about some of these things.   I will circle back to him when I get more information.

## 2017-02-10 ENCOUNTER — Encounter: Payer: Self-pay | Admitting: Neurology

## 2017-02-11 ENCOUNTER — Telehealth: Payer: Self-pay | Admitting: Internal Medicine

## 2017-02-11 NOTE — Telephone Encounter (Signed)
Copied from [place] [phone]. Topic: Quick Communication - See Telephone Encounter >> Feb 11, 2017  1:46 PM Bea Graff, NT wrote: CRM for notification. See Telephone encounter for: [facility] [address] [facility] [place] is calling checking on a fax for a rx change for this pt. They are requesting the hydrocortisone be changed proctozone hc 2.5%. The other medication product is not covered by the pts insurance. CB#: [phone] Fax# [phone]  02/11/17.

## 2017-02-11 NOTE — Telephone Encounter (Signed)
Ok to change? Please advise 

## 2017-02-16 NOTE — Telephone Encounter (Signed)
Okay to change prescription. 

## 2017-02-18 ENCOUNTER — Other Ambulatory Visit: Payer: Self-pay | Admitting: Internal Medicine

## 2017-02-18 MED ORDER — HYDROCORTISONE 2.5 % RE CREA: 1.0000 "application " | TOPICAL_CREAM | Freq: Two times a day (BID) | RECTAL | 5 refills | Status: DC

## 2017-02-18 NOTE — Telephone Encounter (Signed)
proctozone hc 2.5% was e[facility]scribed to [facility]

## 2017-03-10 ENCOUNTER — Other Ambulatory Visit: Payer: Self-pay | Admitting: Neurology

## 2017-03-19 ENCOUNTER — Other Ambulatory Visit: Payer: Self-pay | Admitting: Cardiovascular Disease

## 2017-04-11 ENCOUNTER — Telehealth: Payer: Self-pay | Admitting: Pharmacist Clinician (PhC)/ Clinical Pharmacy Specialist

## 2017-04-11 NOTE — Telephone Encounter (Signed)
Received fax from [facility] asking [place] to switch patient from Xarelto to warfarin because of cost issues.  Unable to reach patient by phone on two attampts.  Faxed form back stating unable to switch patient to warfarin without having a conversation with him.  He will need to understand the need for regular monitoring and any potential costs that could be associated with it.

## 2017-04-15 ENCOUNTER — Ambulatory Visit (INDEPENDENT_AMBULATORY_CARE_PROVIDER_SITE_OTHER): Payer: Medicare Other | Admitting: *Deleted

## 2017-04-15 ENCOUNTER — Telehealth: Payer: Self-pay | Admitting: Cardiovascular Disease

## 2017-04-15 DIAGNOSIS — I495 Sick sinus syndrome: Secondary | ICD-10-CM | POA: Diagnosis not present

## 2017-04-15 NOTE — Telephone Encounter (Signed)
Spoke with Mr. Stephen Robbins informed him that his transmission was received and it would be processed and sent on to the MD for review and then he would receive a letter in the mail. Pt voiced understanding

## 2017-04-15 NOTE — Progress Notes (Signed)
Remote pacemaker transmission.   

## 2017-04-15 NOTE — Telephone Encounter (Signed)
New Message  Pt call requesting to speak with RN about getting results from device check in 1/22

## 2017-04-16 ENCOUNTER — Encounter: Payer: Self-pay | Admitting: Cardiology

## 2017-05-06 LAB — CUP PACEART REMOTE DEVICE CHECK
Battery Remaining Longevity: 144 mo
Battery Remaining Percentage: 100 %
Brady Statistic RA Percent Paced: 43 %
Brady Statistic RV Percent Paced: 2 %
Date Time Interrogation Session: 20190122143300
Implantable Lead Implant Date: 20151013
Implantable Lead Implant Date: 20151013
Implantable Lead Location: 753859
Implantable Lead Location: 753860
Implantable Lead Model: 4135
Implantable Lead Model: 4136
Implantable Lead Serial Number: 29480373
Implantable Lead Serial Number: 29608302
Implantable Pulse Generator Implant Date: 20151013
Lead Channel Impedance Value: 568 Ohm
Lead Channel Impedance Value: 668 Ohm
Lead Channel Setting Pacing Amplitude: 2 V
Lead Channel Setting Pacing Amplitude: 4 V
Lead Channel Setting Pacing Pulse Width: 1 ms
Lead Channel Setting Sensing Sensitivity: 2.5 mV
Pulse Gen Serial Number: 702951

## 2017-05-09 DIAGNOSIS — H2513 Age-related nuclear cataract, bilateral: Secondary | ICD-10-CM | POA: Diagnosis not present

## 2017-05-09 DIAGNOSIS — H52203 Unspecified astigmatism, bilateral: Secondary | ICD-10-CM | POA: Diagnosis not present

## 2017-05-09 DIAGNOSIS — E119 Type 2 diabetes mellitus without complications: Secondary | ICD-10-CM | POA: Diagnosis not present

## 2017-05-09 DIAGNOSIS — H524 Presbyopia: Secondary | ICD-10-CM | POA: Diagnosis not present

## 2017-05-09 LAB — HM DIABETES EYE EXAM

## 2017-05-14 ENCOUNTER — Other Ambulatory Visit: Payer: Self-pay | Admitting: Neurology

## 2017-05-14 MED ORDER — CARBIDOPA-LEVODOPA 25-100 MG PO TABS: ORAL_TABLET | ORAL | 0 refills | Status: DC

## 2017-06-12 ENCOUNTER — Ambulatory Visit: Payer: Medicare Other | Admitting: Neurology

## 2017-06-13 ENCOUNTER — Telehealth: Payer: Self-pay | Admitting: Internal Medicine

## 2017-06-13 NOTE — Telephone Encounter (Signed)
Copied from [place]. Topic: Quick Communication - See Telephone Encounter >> Jun 13, 2017 11:06 AM Percell Belt A wrote: CRM for notification. See Telephone encounter for: 06/13/17.  Pt called in and stated that the canagliflozin (INVOKANA) 300 MG TABS tablet [[id number] is needing a PA completed on it.  Has the office rec'd this PA from the Pharmacy?  Pt would like to know the status?    Best number for pt [phone]

## 2017-06-13 NOTE — Telephone Encounter (Signed)
Prior auth for Invokana sent to [email or web address].

## 2017-06-13 NOTE — Progress Notes (Signed)
Stephen Robbins was seen today in f/u in the movement clinic.  He was dx with PD last visit, on 06/17/13.  He is on carbidopa/levodopa 25/100 tid.  He has no side effects with the carbidopa/levodopa, but states that "I just don't know what to expect."  He continues to have tremor, which can be bothersome for him.  He has had no falls.  No lightheadedness.  No near syncope.  No hallucinations.  He had a carotid ultrasound done since last visit.  There is 1-39% stenosis bilaterally.  He did have some lab work done since last visit.  His hemoglobin A1c has greatly improved.  It is 6.3 (was 7.9 and prior to that was 9.4). He just finished PT and is enrolled in the exercise class now.  He presents today with a long list of questions.  He again tape records our session.    11/19/13 update:  Pt is f/u regarding Parkinson's disease.  He is currently on carbidopa/levodopa 25/100, one tablet 3 times per day. He is accompanied by his wife, who supplements the history.   Patient denies any falls.  He denies hallucinations.  Denies nausea or vomiting.  No lightheadedness or near syncope.  He is exercising.  He still has tremor and isn't sure that the levodopa helps tremor.  He sent me several e-mails since last visit.  One of these was just a TV news link regarding [facility] high-resolution imaging and deep brain stimulation.  He subsequently sent another e-mail regarding focused ultrasound.  Asks about updates regarding these and has 2 pages of questions.  Asks about why we can't give him human growth hormone or steroids for PD.  Overall, patient is clinically doing well.   02/21/14 update:   Pt returns for f/u, accompanied by his wife who supplements the history.  The records that were made available to me were reviewed.  Pt has had PPM since last visit due to bradycardia.  He has been told not to exercise until 12/9.  He will start with PWR moves classes after the first of the year as well as circuit 2 classes.  He is still  on carbidopa/levodopa 25/100 at 8-9am/12-1pm/6-7 pm.  He is c/o paresthesias/"neuropathy" in the R foot.  He only notices it with driving.   He has no back pain.  He has no sensation of balance loss with closing eyes in the shower.  He thinks that exposure to levodopa causes the neuropathy and brings with him a small study (58 pts) that suggests that levodopa could be an etiology for the neuropathy.  Notices lack of smell.  No falls.  Rare lightheadedness. No hallucinations.    05/23/14 update:  Pt is f/u today, accompanied by his wife who supplements the history.  [facility] went to [facility] since our last visit and I reviewed those records.  He was told he had PN (known diagnosis) but no new meds were prescribed.  He asks about a "cure" for PN that a doctor in [place] was advertising.   He remains on carbidopa/levodopa 25/100 tid for the PD.  He is exercising faithfully.  He has trouble getting himself over 3 miles per hour when walking and he is concerned about that.  He asks again about taking steroids and human growth hormone.  He also asks about taking protein supplements.  He has d/c the glucosamine for joint pain/arthritis as he didn't think that it was helpful.    09/08/14 update:  The patient  is following up today.  He is accompanied by his wife who supplements the history.   Records were reviewed since last visit, from his primary care physician, podiatrist, as well as his cardiologist.  He remains on carbidopa/levodopa 25/100, one tablet 3 times per day.  He notices no wearing off.  He is doing PD circuit class.  He called his podiatrist since last visit and asked him about medications for neuropathy and he deferred to me.  We started gabapentin on May 30, 2014.  He is on 300 mg twice a day.  He states that his feet numbness is better.  He has continued to complain about fatigue.  His pacemaker was recently adjusted.  He is trying to walk/exercise at the park and his endurance has been better ever  since.  He again asks about energy.  He is back on testosterone for that.    01/12/15 update:  The patient returns today for follow-up, accompanied by his wife who supplements the history.  He remains on carbidopa/levodopa 25/100, one tablet 3 times per day.  Overall, the patient states that he is doing well.   Still has some intermittent tremor.  He denies any syncope.  He has had no falls since last visit.  He has had some near falls.  No hallucinations or visual distortions.  He is on gabapentin, 300 mg twice a day for peripheral neuropathy secondary to diabetes mellitus. Pt states that it is helping with the sx's, along with the "natural herb that I take."   He continues to faithfully exercise with the PWR classes.  Some lightheaded after exercise.  No new medical issues since last visit.    04/18/15 update:  The patient presents today, accompanied by his wife who supplements the history.  Last visit, increased the patient's carbidopa/levodopa 25/100, so that he is now taking 1-1/2 tablets 3 times per day.   When asked how he feel, he states, "I don't know how I am supposed to feel."  He denies stiffness.  He has minimal thumb tremor.   He is supposed to be on gabapentin, 300 mg twice a day for peripheral neuropathy but went down to q day because his feet weren't bothering him.  I have reviewed records made available to me since last visit.  He was diagnosed with atrial fibrillation and started on Xarelto.  He is doing well with that.  He denies any bleeding difficulties.  No falls.  No near syncope.  Rarely does he even feel unstable.  He is exercising.  Goes to the gym three times a week.    08/22/15 update:  The patient presents today, accompanied by his wife who supplements the history. He is on carbidopa/levodopa 25/100, 1-1/2 tablets 3 times per day.  He remains on gabapentin, 300 mg once a day for peripheral neuropathy (7-9pm). He states that he doesn't think that he can come off of it, at least until  he moves.  He did come to the PD support group lecture that I gave 2 weeks ago.  He denies hallucinations, lightheadedness, near syncope.  He does state that they are moving to [place] in assisted living.  States that there has been some stress with moving, as he is having to fix a hole that was discovered in the bathroom floor.   Asks me about alpha-lipoic acid and states that the people who sell it to him "think that it is good."    12/26/15 update:  The patient follows up today, accompanied  by his wife who supplements the history.  He remains on carbidopa/levodopa 25/100, 1-1/2 tablets 3 times per day.  He is still on gabapentin, 300 mg at night for neuropathy.  States that he doesn't want to try and d/c that.    He has since moved to [place] and they are adjusting well.  They have sold their house.  He is doing a [employer] class.  No falls.  Some near falls.    04/29/16 update:  Patient follows up today, accompanied by his wife who supplements the history.  Patient is on carbidopa/levodopa 25/100, 1.5 tablets 3 times per day (8am/noon/5pm).  Pt denies falls.  Pt denies lightheadedness, near syncope.  No hallucinations.  Mood has been "up and down."  Occasional bouts of constipation vs diarrhea.   On gabapentin 300 q hs for PN.  Did OT/ST since our last visit and reviewed those notes.  Doing PWR moves classes on wednesdays.    09/03/16 update:  Patient seen today in follow-up, accompanied by his wife who supplements the history.  Patient remains on carbidopa/levodopa 25/100 25/100, 1-1/2 tablets at 8 AM/noon/5 PM.  He has had no falls.  No lightheadedness or near syncope.  No hallucinations.  Remains on gabapentin, but now on 300 mg bid.  Seems to be doing better with the neuropathic sx's now.  The records that were made available to me were reviewed.  Metformin started by pcp as HgBA1C was rising.  Has some constipation.  He pulled me to the side and states that he is slightly more depressed dealing with  his wifes alzheimers.  No SI/HI.    02/07/17 update: Patient is seen today in follow-up.  He is accompanied by his wife who supplements the history.  Patient is on carbidopa/levodopa 25/100, 1-1/2 tablets 3 times per day.  Patient has had no falls since last visit.  No hallucinations.  He is on gabapentin, 300 mg twice per day for paresthesias.  This seems to be helping and is well controlled and he doesn't even always take the evening gabapentin dosage.  He has had no lightheadedness or near syncope.  He continues to have some stress associated with caregiving for his wife who has dementia.  Records have been reviewed since last visit.  He was in the emergency room twice since last visit, once for constipation and once for low back pain.  When asked how much water he was drinking he stated "obviously not enough."  He has not tried the rancho recipe and doesn't remember that I gave it to him.  Asks for another copy.  He had labwork few days ago and reports that his thyroid medication was adjusted to account for high TSH.   06/17/17 update: Patient is seen today in follow-up.  The patient is accompanied by his wife who supplements the history.  The patient is doing well on carbidopa/levodopa 25/100, 1.5 tablets 3 times per day.  No falls, no hallucinations.  No lightheadedness or near syncope.  Records have been reviewed since our last visit.  He went to the [facility] support group but is somewhat frustrated at the time of it.  He is following rancho recipe and uses some miralax.  States that he drinks about 30 oz per day of water.  Still with some constipation.  Asks about special diets for PD.    No neuroimaging since CT brain in 2009.  PREVIOUS MEDICATIONS: none to date  ALLERGIES:   Allergies  Allergen Reactions  . Penicillins  Other (See Comments)    Unknown childhood reaction    CURRENT MEDICATIONS:  Current Outpatient Medications on File Prior to Visit  Medication Sig Dispense Refill  .  bisacodyl (DULCOLAX) 5 MG EC tablet Take 5 mg daily as needed by mouth for moderate constipation.    . canagliflozin (INVOKANA) 300 MG TABS tablet TAKE ONE TABLET BY MOUTH DAILY BEFORE BREAKFAST 90 tablet 2  . carbidopa-levodopa (SINEMET IR) 25-100 MG tablet take 1 and 1/2 tablets by mouth three times a day before meals 405 tablet 0  . fish oil-omega-3 fatty acids 1000 MG capsule Take 2 g by mouth daily.    Marland Kitchen gabapentin (NEURONTIN) 300 MG capsule TAKE 1 CAPSULE(300 MG) BY MOUTH TWICE DAILY 180 capsule 1  . hydrocortisone (PROCTOZONE-HC) 2.5 % rectal cream Place 1 application rectally 2 (two) times daily. 30 g 5  . hydrocortisone cream 0.5 % Apply 1 application 2 (two) times daily as needed topically. rash 30 g 3  . ibuprofen (ADVIL,MOTRIN) 200 MG tablet Take 200 mg by mouth daily as needed for mild pain.     Marland Kitchen levothyroxine (SYNTHROID, LEVOTHROID) 75 MCG tablet Take 1 tablet (75 mcg total) daily by mouth. 90 tablet 3  . metFORMIN (GLUCOPHAGE-XR) 500 MG 24 hr tablet Take 2 tablets (1,000 mg total) by mouth daily with breakfast. 180 tablet 5  . mirabegron ER (MYRBETRIQ) 25 MG TB24 tablet Take 25 mg by mouth daily.    Vladimir Faster Glycol-Propyl Glycol (SYSTANE ULTRA) 0.4-0.3 % SOLN Place 1 drop into both eyes daily as needed (dry eyes).     . polyethylene glycol powder (MIRALAX) powder Take 17 g daily by mouth. 255 g 0  . Pramoxine-HC (HYDROCORTISONE ACE-PRAMOXINE) 2.5-1 % CREA Apply rectally twice daily as needed 28.35 g 0  . sertraline (ZOLOFT) 25 MG tablet Take 1 tablet (25 mg total) by mouth every morning. Reported on 06/02/2015 30 tablet 5  . simvastatin (ZOCOR) 20 MG tablet Take 1 tablet (20 mg total) at bedtime by mouth. 90 tablet 3  . traMADol (ULTRAM) 50 MG tablet Take 1 tablet (50 mg total) every 6 (six) hours as needed by mouth. 12 tablet 0  . vitamin B-12 (CYANOCOBALAMIN) 1000 MCG tablet Take 1,000 mcg by mouth every morning.     Alveda Reasons 20 MG TABS tablet TAKE 1 TABLET BY MOUTH DAILY 90  tablet 1   No current facility-administered medications on file prior to visit.     PAST MEDICAL HISTORY:   Past Medical History:  Diagnosis Date  . Arthritis    "joints; a little" (01/04/2014)  . Asymptomatic bilateral carotid artery stenosis    per duplex  05-15-2012  left >39%/   right 40-59%  . Basal cell carcinoma    nose  . Borderline diabetes   . BPH (benign prostatic hypertrophy) with urinary obstruction   . Bradycardia   . Bronchial pneumonia 1958  . Chronotropic incompetence with sinus node dysfunction (HCC)   . Compressed cervical disc   . Compression of lumbar vertebra (HCC)    L4 -- L5  . Dizzy   . Dysmetabolic syndrome   . Fatigue   . Frequency of urination   . GERD (gastroesophageal reflux disease)    occasional  . Hemorrhoids   . Hyperlipidemia   . Hypothyroidism   . Nocturia   . NSVT (nonsustained ventricular tachycardia) [facility])    cardiologist-  dr croitoru  . Pacemaker   . Urgency of urination   . Wears glasses  PAST SURGICAL HISTORY:   Past Surgical History:  Procedure Laterality Date  . BASAL CELL CARCINOMA EXCISION     nose  . CLOSED REDUCTION NASAL FRACTURE  09-01-2007  . CYSTOSCOPY N/A 12/28/2012   Procedure: CYSTOSCOPY;  Surgeon: Bernestine Amass, MD;  [facility];  Service: Urology;  Laterality: N/A;  . EXERCISE TOLERENCE TEST  12-03-2012  DR CROITURO   CHRONOTROPIC INCOMPETENCE/ NORMAL RESTING BP W/ APPROPRIATE RESPONSE/ NO CHEST PAIN/ NO ST CHANGES FROM BASELINE  . INSERT / REPLACE / REMOVE PACEMAKER  01/04/2014   [employer] model L121 serial number [id number]  . LACERATION REPAIR Right 1978   middle finger  . NEUROPLASTY / TRANSPOSITION ULNAR NERVE AT ELBOW Left 02-09-2010  . PERMANENT PACEMAKER INSERTION N/A 01/04/2014   Procedure: PERMANENT PACEMAKER INSERTION;  Surgeon: Sanda Klein, MD;  Location: [place] CATH [facility];  Service: Cardiovascular;  Laterality: N/A;  . TONSILLECTOMY AND ADENOIDECTOMY   1944  . TRANSURETHRAL INCISION OF PROSTATE N/A 12/28/2012   Procedure: TRANSURETHRAL INCISION OF THE PROSTATE (TUIP);  Surgeon: Bernestine Amass, MD;  Location: [facility];  Service: Urology;  Laterality: N/A;  . ULNAR NERVE TRANSPOSITION      SOCIAL HISTORY:   Social History   Socioeconomic History  . Marital status: Married    Spouse name: Not on file  . Number of children: 0  . Years of education: Not on file  . Highest education level: Not on file  Occupational History  . Occupation: retired    Fish farm manager: RETIRED    Comment: executive   Social Needs  . Financial resource strain: Not on file  . Food insecurity:    Worry: Not on file    Inability: Not on file  . Transportation needs:    Medical: Not on file    Non-medical: Not on file  Tobacco Use  . Smoking status: Former Smoker    Packs/day: 1.00    Years: 10.00    Pack years: 10.00    Types: Cigarettes    Last attempt to quit: 03/25/1968    Years since quitting: 49.2  . Smokeless tobacco: Never Used  Substance and Sexual Activity  . Alcohol use: Yes    Alcohol/week: 4.2 oz    Types: 7 Glasses of wine per week    Comment: 1 glass wine daily  . Drug use: No  . Sexual activity: Not on file  Lifestyle  . Physical activity:    Days per week: Not on file    Minutes per session: Not on file  . Stress: Not on file  Relationships  . Social connections:    Talks on phone: Not on file    Gets together: Not on file    Attends religious service: Not on file    Active member of club or organization: Not on file    Attends meetings of clubs or organizations: Not on file    Relationship status: Not on file  . Intimate partner violence:    Fear of current or ex partner: Not on file    Emotionally abused: Not on file    Physically abused: Not on file    Forced sexual activity: Not on file  Other Topics Concern  . Not on file  Social History Narrative   Regular exercise          FAMILY HISTORY:     Family Status  Relation Name Status  . Father  Deceased  stroke  . Mother  Deceased       lung cancer  . Brother  Alive       healthy    ROS:    A complete 10 system review of systems was obtained and was unremarkable apart from what is mentioned above.  PHYSICAL EXAMINATION:    VITALS:   Vitals:   06/17/17 1309  BP: 126/60  Pulse: 78  SpO2: 97%  Weight: 174 lb (78.9 kg)  Height: 5\' 9"  (1.753 m)   Wt Readings from Last 3 Encounters:  06/17/17 174 lb (78.9 kg)  02/07/17 172 lb (78 kg)  02/04/17 176 lb 6.4 oz (80 kg)     GEN:  The patient appears stated age and is in NAD. HEENT:  [place]/AT.  MMM CV:  RRR (no afib today) Lungs:  CTAB Neck:  No bruits   Neurological examination:  Orientation: The patient is alert and oriented x3.  Cranial nerves: There is good facial symmetry. Mild facial hypomimia.  Extraocular muscles are intact.  Visual fields are full to number additional test.  Speech is fluent and clear.  Soft palate rises symmetrically and there is no tongue deviation.  Hearing is intact to conversational tone.   Motor: Strength is 5/5 in the bilateral upper and lower extremities.   Shoulder shrug is equal and symmetric.  There is no pronator drift. Sensation:  Intact to light touch.  More sensitive aspects not tested today  Movement examination: Tone: There is mild rigidity in the RUE.  It is good elsewhere Abnormal movements: There is very rare RUE tremor Coordination:  There are good rapid alternating movement today. Gait and Station: The patient has no difficulty arising out of a deep-seated chair without the use of the hands. The patient's stride length is fairly normal with good arm swing.  He has a negative pull test.  LABS:  Lab Results  Component Value Date   TSH 8.33 (H) 02/04/2017   Lab Results  Component Value Date   VITAMINB12 872 06/17/2013     Chemistry      Component Value Date/Time   NA 136 02/04/2017 1207   K 3.8 02/04/2017 1207    CL 100 02/04/2017 1207   CO2 28 02/04/2017 1207   BUN 22 02/04/2017 1207   CREATININE 0.79 02/04/2017 1207   CREATININE 0.77 12/29/2013 1627      Component Value Date/Time   CALCIUM 9.5 02/04/2017 1207   ALKPHOS 39 02/04/2017 1207   AST 19 02/04/2017 1207   ALT 10 02/04/2017 1207   BILITOT 0.9 02/04/2017 1207     RPR negative Lab Results  Component Value Date   FOLATE >20.0 06/17/2013     Lab Results  Component Value Date   HGBA1C 6.7 (H) 02/04/2017    ASSESSMENT/PLAN:  1. idiopathic Parkinson's disease.  I see no atypical features.    -He will continue the carbidopa/levodopa 25/100, 1.5 tablets tid.  He states that his pill is white but told him to recheck as thought that most IR were yellow.  Risks, benefits, side effects and alternative therapies were discussed.  The opportunity to ask questions was given and they were answered to the best of my ability.  The patient expressed understanding and willingness to follow the outlined treatment protocols.  -asks about special diet for PD.  Talked about well rounded diet.  Asks about a special supplement/diet by someone that claims that they cured PD with diet.  I discussed with him that there is  no cure for PD.  -asks about movement specialist in [place].  Discussed that there isn't one in [place] but happy to transfer him if easier for him to a general neurologist.  He wishes to stay here for now.    -ST referral (would like to see if [place] [facility] has a [facility] program)  -Asks about sublingual apomorphine.  This is not yet on the market, but he would not be a candidate right now as he is not needed.  Discussed this with him today. 2.  Peripheral neuropathy  -There is evidence of this on examination.  Likely from DM.  His workup for reversible causes was negative.  His diabetes is under much better control than in the past.  -currently on gabapentin - 1 -2 times per day.  He does wish to continue given paresthesias  in the feet. 3.  Hx of transaminasemia  -These were elevated in 2012 but have improved 4.  Constipation  -continue hydration but increase water intake.  Can continue with rancho recipe and MiraLAX as needed. 5.  Caregiver stress  -talked about stress with caregiving for wife with AD  -attending new support group at [facility].  Follow-up with me will be in the next 4-5 months, sooner should new neurologic issues arise.  Much greater than 50% of this visit was spent in counseling and coordinating care.  Total face to face time:  30 min

## 2017-06-17 ENCOUNTER — Encounter: Payer: Self-pay | Admitting: Internal Medicine

## 2017-06-17 ENCOUNTER — Ambulatory Visit (INDEPENDENT_AMBULATORY_CARE_PROVIDER_SITE_OTHER): Payer: Medicare Other | Admitting: Neurology

## 2017-06-17 ENCOUNTER — Encounter: Payer: Self-pay | Admitting: Neurology

## 2017-06-17 VITALS — BP 126/60 | HR 78 | Ht 69.0 in | Wt 174.0 lb

## 2017-06-17 DIAGNOSIS — E1142 Type 2 diabetes mellitus with diabetic polyneuropathy: Secondary | ICD-10-CM

## 2017-06-17 DIAGNOSIS — G20 Parkinson's disease: Secondary | ICD-10-CM

## 2017-06-17 NOTE — Telephone Encounter (Signed)
Noted  

## 2017-06-17 NOTE — Telephone Encounter (Signed)
Fax received from [facility] stating the request was denied and this was given to Dr Truddie Hidden asst.

## 2017-06-17 NOTE — Patient Instructions (Signed)
  [facility] Parkinson's [facility] Movement [facility]  The [facility] team know that living well with a movement disorder extends far beyond our clinic walls. We are together with you. Our team is passionate about providing resources to you and your loved ones who are living with Parkinson's disease and movement disorders. Participate in these programs and join our community. These resources are free or low cost!   [facility] is adding:   Innovative educational programs for patients and caregivers.   Support groups for patients and caregivers living with Parkinson's disease.   Parkinson's specific exercise programs.   Custom tailored therapeutic programs that will benefit patient's living with Parkinson's disease.   We are in this together. You can help and contribute to grow these programs and resources in our community. 100% of the funds donated to the Movement Disorders Fund stays right here in our community to support patients and their caregivers.  To make a tax deductible contribution:  -ask for a [facility] for Parkinson's envelope in the office today.  - call the Office of Institutional Advancement at [phone].         

## 2017-06-18 ENCOUNTER — Encounter: Payer: Self-pay | Admitting: Internal Medicine

## 2017-06-18 NOTE — Telephone Encounter (Signed)
Pt is calling back and he is now out of invokana. Please advise

## 2017-06-19 ENCOUNTER — Encounter: Payer: Self-pay | Admitting: Internal Medicine

## 2017-06-19 ENCOUNTER — Telehealth: Payer: Self-pay | Admitting: Neurology

## 2017-06-19 NOTE — Telephone Encounter (Signed)
Spoke to patient and updated him as to what was going on with his medication. I spoke to [facility] and informed them that patient has tried both alternatives and neither worked or patient so the Dr. Burnice Logan used invokana. Employee from [facility] stated that if the papers could be signed as soon as possible she can put a stat order on patients medication. Papers needing to be signed will be faxed from today.

## 2017-06-19 NOTE — Telephone Encounter (Signed)
Mychart message sent to patient.  Letting him know no [facility] certified therapists available in [facility].

## 2017-06-22 NOTE — Telephone Encounter (Signed)
Was this patient's medication refill taken care of??

## 2017-06-23 NOTE — Telephone Encounter (Signed)
Spoke to patient and notified him that I will update him as soon as I get information from [facility].

## 2017-06-23 NOTE — Telephone Encounter (Signed)
Haven't received fax paperwork at the scheduled date. Phone [facility] for a refax and was told it was easier and quicker over the phone. Information given to pharmacist for approval.

## 2017-06-24 NOTE — Telephone Encounter (Signed)
Patient is picking up farxiga a months worth of samples to hold him over. Medication prior Josem Kaufmann was denied for the second time. An appeal will be filed.

## 2017-06-30 NOTE — Telephone Encounter (Signed)
Please follow up with [facility] and pt. Thanks!

## 2017-07-02 NOTE — Telephone Encounter (Signed)
Patient was called to see if the Stephen Robbins is working out for him. Pt states that he is not sure if its working. I informed him to check his Blood sugar and he stated that he needed a glucometer.  I advised him to schedule a follow-up to check and to give patient a glucometer. I informed patient that I will call him or he can call me next wed. To see if we could get him in.

## 2017-07-02 NOTE — Telephone Encounter (Signed)
Spoke to patient and patient is aware.

## 2017-07-03 ENCOUNTER — Telehealth: Payer: Self-pay | Admitting: Internal Medicine

## 2017-07-03 ENCOUNTER — Other Ambulatory Visit: Payer: Self-pay

## 2017-07-03 MED ORDER — DAPAGLIFLOZIN PROPANEDIOL 10 MG PO TABS: 10.0000 mg | ORAL_TABLET | Freq: Every day | ORAL | 0 refills | Status: DC

## 2017-07-03 NOTE — Telephone Encounter (Signed)
Copied from [place] [phone]. Topic: Quick Communication - See Telephone Encounter >> Jul 03, 2017  3:18 PM Aurelio Brash B wrote: CRM for notification. See Telephone encounter for: 07/03/17.  PT called about  appeal for denial on  canagliflozin (INVOKANA) 300 MG TABS tablet -  he says the [facility] and [facility],   needs to include  dosage and time - 300mg  every morning,  90 day supply

## 2017-07-04 ENCOUNTER — Telehealth: Payer: Self-pay | Admitting: Internal Medicine

## 2017-07-04 NOTE — Telephone Encounter (Signed)
Copied from [place] [phone]. Topic: Quick Communication - See Telephone Encounter >> Jul 04, 2017 11:20 AM Boyd Kerbs wrote: CRM for notification.   [facility] [phone] ext. [phone] regarding canagliflozin (INVOKANA) 300 MG TABS tablet. Did pt try  Wilder Glade or Vania Rea  See Telephone encounter for: 07/04/17.

## 2017-07-04 NOTE — Telephone Encounter (Signed)
I called Stephen Robbins at the number below and informed her the pt has tried both medications and these were ineffective.  She stated she will forward this to the correct department and a determination letter will be sent to the office.  Forwarded to Dr Truddie Hidden asst as Juluis Rainier.

## 2017-07-07 NOTE — Telephone Encounter (Signed)
Noted  

## 2017-07-09 ENCOUNTER — Telehealth: Payer: Self-pay | Admitting: Family Medicine

## 2017-07-09 NOTE — Telephone Encounter (Signed)
Copied from [place] [phone]. Topic: Quick Communication - See Telephone Encounter >> Jul 04, 2017 11:20 AM Boyd Kerbs wrote: CRM for notification.   [facility] [phone] ext. [phone] regarding canagliflozin (INVOKANA) 300 MG TABS tablet. Did pt try  Wilder Glade or Jardiance  See Telephone encounter for: 07/04/17. >> Jul 09, 2017 10:09 AM Boyd Kerbs wrote: Pt. Is calling back about medication and devise to check blood ?    May leave message if needed

## 2017-07-09 NOTE — Telephone Encounter (Signed)
Fax received from [place] stating the request was approved from 07/04/2017-07/05/2018.  I called [facility] and informed [place] of this.  Message sent to Dr Truddie Hidden asst as Juluis Rainier.

## 2017-07-09 NOTE — Telephone Encounter (Signed)
Spoke to patient and he will come in 07/2017 for glucose follow-up and patient will receive a glucometer.

## 2017-07-11 ENCOUNTER — Other Ambulatory Visit: Payer: Self-pay | Admitting: Internal Medicine

## 2017-07-14 NOTE — Telephone Encounter (Signed)
Medication filled to pharmacy as requested.   

## 2017-07-15 ENCOUNTER — Ambulatory Visit (INDEPENDENT_AMBULATORY_CARE_PROVIDER_SITE_OTHER): Payer: Medicare Other | Admitting: *Deleted

## 2017-07-15 DIAGNOSIS — I495 Sick sinus syndrome: Secondary | ICD-10-CM

## 2017-07-15 NOTE — Progress Notes (Signed)
Remote pacemaker transmission.   

## 2017-07-16 ENCOUNTER — Encounter: Payer: Self-pay | Admitting: Cardiology

## 2017-07-17 LAB — CUP PACEART REMOTE DEVICE CHECK
Battery Remaining Longevity: 150 mo
Battery Remaining Percentage: 100 %
Brady Statistic RA Percent Paced: 42 %
Brady Statistic RV Percent Paced: 2 %
Date Time Interrogation Session: 20190423093100
Implantable Lead Implant Date: 20151013
Implantable Lead Implant Date: 20151013
Implantable Lead Location: 753859
Implantable Lead Location: 753860
Implantable Lead Model: 4135
Implantable Lead Model: 4136
Implantable Lead Serial Number: 29480373
Implantable Lead Serial Number: 29608302
Implantable Pulse Generator Implant Date: 20151013
Lead Channel Impedance Value: 524 Ohm
Lead Channel Impedance Value: 654 Ohm
Lead Channel Setting Pacing Amplitude: 2 V
Lead Channel Setting Pacing Amplitude: 4 V
Lead Channel Setting Pacing Pulse Width: 1 ms
Lead Channel Setting Sensing Sensitivity: 2.5 mV
Pulse Gen Serial Number: 702951

## 2017-07-18 ENCOUNTER — Other Ambulatory Visit: Payer: Self-pay

## 2017-08-04 ENCOUNTER — Encounter: Payer: Medicare Other | Admitting: Cardiovascular Disease

## 2017-08-04 ENCOUNTER — Encounter: Payer: Self-pay | Admitting: Internal Medicine

## 2017-08-04 ENCOUNTER — Ambulatory Visit (INDEPENDENT_AMBULATORY_CARE_PROVIDER_SITE_OTHER): Payer: Medicare Other | Admitting: Internal Medicine

## 2017-08-04 VITALS — BP 90/60 | HR 67 | Temp 98.4°F | Wt 176.0 lb

## 2017-08-04 DIAGNOSIS — E119 Type 2 diabetes mellitus without complications: Secondary | ICD-10-CM | POA: Diagnosis not present

## 2017-08-04 DIAGNOSIS — E1142 Type 2 diabetes mellitus with diabetic polyneuropathy: Secondary | ICD-10-CM

## 2017-08-04 DIAGNOSIS — G20 Parkinson's disease: Secondary | ICD-10-CM | POA: Diagnosis not present

## 2017-08-04 DIAGNOSIS — I480 Paroxysmal atrial fibrillation: Secondary | ICD-10-CM | POA: Diagnosis not present

## 2017-08-04 LAB — POCT GLYCOSYLATED HEMOGLOBIN (HGB A1C): Hemoglobin A1C: 6.6

## 2017-08-04 MED ORDER — GLUCOSE BLOOD VI STRP: ORAL_STRIP | 12 refills | Status: DC

## 2017-08-04 NOTE — Progress Notes (Signed)
Subjective:    Patient ID: Stephen Robbins, male    DOB: 12-03-1935, 82 y.o.   MRN: [id number]  HPI  82 year old patient who is seen today for follow-up.  He has a history of paroxysmal atrial fibrillation and remains on anticoagulation. He has type 2 diabetes which has been controlled with metformin as well as SGLT2 therapy Doing well today.  He is followed close by cardiology.  He is followed by neurology for PD which has been very well controlled on medications.  He has hypothyroidism.  Lab Results  Component Value Date   HGBA1C 6.6 08/04/2017    Past Medical History:  Diagnosis Date  . Arthritis    "joints; a little" (01/04/2014)  . Asymptomatic bilateral carotid artery stenosis    per duplex  05-15-2012  left >39%/   right 40-59%  . Basal cell carcinoma    nose  . Borderline diabetes   . BPH (benign prostatic hypertrophy) with urinary obstruction   . Bradycardia   . Bronchial pneumonia 1958  . Chronotropic incompetence with sinus node dysfunction (HCC)   . Compressed cervical disc   . Compression of lumbar vertebra (HCC)    L4 -- L5  . Dizzy   . Dysmetabolic syndrome   . Fatigue   . Frequency of urination   . GERD (gastroesophageal reflux disease)    occasional  . Hemorrhoids   . Hyperlipidemia   . Hypothyroidism   . Nocturia   . NSVT (nonsustained ventricular tachycardia) [facility])    cardiologist-  dr croitoru  . Pacemaker   . Urgency of urination   . Wears glasses      Social History   Socioeconomic History  . Marital status: Married    Spouse name: Not on file  . Number of children: 0  . Years of education: Not on file  . Highest education level: Not on file  Occupational History  . Occupation: retired    Fish farm manager: RETIRED    Comment: executive   Social Needs  . Financial resource strain: Not on file  . Food insecurity:    Worry: Not on file    Inability: Not on file  . Transportation needs:    Medical: Not on file    Non-medical: Not on file    Tobacco Use  . Smoking status: Former Smoker    Packs/day: 1.00    Years: 10.00    Pack years: 10.00    Types: Cigarettes    Last attempt to quit: 03/25/1968    Years since quitting: 49.3  . Smokeless tobacco: Never Used  Substance and Sexual Activity  . Alcohol use: Yes    Alcohol/week: 4.2 oz    Types: 7 Glasses of wine per week    Comment: 1 glass wine daily  . Drug use: No  . Sexual activity: Not on file  Lifestyle  . Physical activity:    Days per week: Not on file    Minutes per session: Not on file  . Stress: Not on file  Relationships  . Social connections:    Talks on phone: Not on file    Gets together: Not on file    Attends religious service: Not on file    Active member of club or organization: Not on file    Attends meetings of clubs or organizations: Not on file    Relationship status: Not on file  . Intimate partner violence:    Fear of current or ex partner: Not on file  Emotionally abused: Not on file    Physically abused: Not on file    Forced sexual activity: Not on file  Other Topics Concern  . Not on file  Social History Narrative   Regular exercise          Past Surgical History:  Procedure Laterality Date  . BASAL CELL CARCINOMA EXCISION     nose  . CLOSED REDUCTION NASAL FRACTURE  09-01-2007  . CYSTOSCOPY N/A 12/28/2012   Procedure: CYSTOSCOPY;  Surgeon: Bernestine Amass, MD;  Location: [facility];  Service: Urology;  Laterality: N/A;  . EXERCISE TOLERENCE TEST  12-03-2012  DR CROITURO   CHRONOTROPIC INCOMPETENCE/ NORMAL RESTING BP W/ APPROPRIATE RESPONSE/ NO CHEST PAIN/ NO ST CHANGES FROM BASELINE  . INSERT / REPLACE / REMOVE PACEMAKER  01/04/2014   [employer] model L121 serial number [id number]  . LACERATION REPAIR Right 1978   middle finger  . NEUROPLASTY / TRANSPOSITION ULNAR NERVE AT ELBOW Left 02-09-2010  . PERMANENT PACEMAKER INSERTION N/A 01/04/2014   Procedure: PERMANENT PACEMAKER INSERTION;   Surgeon: Sanda Klein, MD;  Location: [facility];  Service: Cardiovascular;  Laterality: N/A;  . TONSILLECTOMY AND ADENOIDECTOMY  1944  . TRANSURETHRAL INCISION OF PROSTATE N/A 12/28/2012   Procedure: TRANSURETHRAL INCISION OF THE PROSTATE (TUIP);  Surgeon: Bernestine Amass, MD;  [facility];  Service: Urology;  Laterality: N/A;  . ULNAR NERVE TRANSPOSITION      Family History  Problem Relation Age of Onset  . Stroke Father   . Lung cancer Mother     Allergies  Allergen Reactions  . Penicillins Other (See Comments)    Unknown childhood reaction    Current Outpatient Medications on File Prior to Visit  Medication Sig Dispense Refill  . bisacodyl (DULCOLAX) 5 MG EC tablet Take 5 mg daily as needed by mouth for moderate constipation.    . canagliflozin (INVOKANA) 300 MG TABS tablet TAKE ONE TABLET BY MOUTH DAILY BEFORE BREAKFAST 90 tablet 2  . carbidopa-levodopa (SINEMET IR) 25-100 MG tablet take 1 and 1/2 tablets by mouth three times a day before meals 405 tablet 0  . dapagliflozin propanediol (FARXIGA) 10 MG TABS tablet Take 10 mg by mouth daily. 30 tablet 0  . fish oil-omega-3 fatty acids 1000 MG capsule Take 2 g by mouth daily.    Marland Kitchen gabapentin (NEURONTIN) 300 MG capsule TAKE 1 CAPSULE(300 MG) BY MOUTH TWICE DAILY 180 capsule 1  . hydrocortisone (PROCTOZONE-HC) 2.5 % rectal cream Place 1 application rectally 2 (two) times daily. 30 g 5  . hydrocortisone cream 0.5 % Apply 1 application 2 (two) times daily as needed topically. rash 30 g 3  . ibuprofen (ADVIL,MOTRIN) 200 MG tablet Take 200 mg by mouth daily as needed for mild pain.     Marland Kitchen levothyroxine (SYNTHROID, LEVOTHROID) 75 MCG tablet Take 1 tablet (75 mcg total) daily by mouth. 90 tablet 3  . metFORMIN (GLUCOPHAGE-XR) 500 MG 24 hr tablet Take 2 tablets (1,000 mg total) by mouth daily with breakfast. 180 tablet 5  . mirabegron ER (MYRBETRIQ) 25 MG TB24 tablet Take 25 mg by mouth daily.    Vladimir Faster  Glycol-Propyl Glycol (SYSTANE ULTRA) 0.4-0.3 % SOLN Place 1 drop into both eyes daily as needed (dry eyes).     . polyethylene glycol powder (MIRALAX) powder Take 17 g daily by mouth. 255 g 0  . Pramoxine-HC (HYDROCORTISONE ACE-PRAMOXINE) 2.5-1 % CREA Apply rectally twice daily as needed 28.35 g  0  . sertraline (ZOLOFT) 25 MG tablet TAKE 1 TABLET BY MOUTH EVERY MORNING 30 tablet 0  . simvastatin (ZOCOR) 20 MG tablet Take 1 tablet (20 mg total) at bedtime by mouth. 90 tablet 3  . traMADol (ULTRAM) 50 MG tablet Take 1 tablet (50 mg total) every 6 (six) hours as needed by mouth. 12 tablet 0  . vitamin B-12 (CYANOCOBALAMIN) 1000 MCG tablet Take 1,000 mcg by mouth every morning.     Alveda Reasons 20 MG TABS tablet TAKE 1 TABLET BY MOUTH DAILY 90 tablet 1   No current facility-administered medications on file prior to visit.     BP 90/60 (BP Location: Right Arm, Patient Position: Sitting, Cuff Size: Normal)   Pulse 67   Temp 98.4 F (36.9 C) (Oral)   Wt 176 lb (79.8 kg)   SpO2 96%   BMI 25.99 kg/m     Review of Systems  Constitutional: Negative for appetite change, chills, fatigue and fever.  HENT: Negative for congestion, dental problem, ear pain, hearing loss, sore throat, tinnitus, trouble swallowing and voice change.   Eyes: Negative for pain, discharge and visual disturbance.  Respiratory: Negative for cough, chest tightness, wheezing and stridor.   Cardiovascular: Negative for chest pain, palpitations and leg swelling.  Gastrointestinal: Negative for abdominal distention, abdominal pain, blood in stool, constipation, diarrhea, nausea and vomiting.  Genitourinary: Negative for difficulty urinating, discharge, flank pain, genital sores, hematuria and urgency.  Musculoskeletal: Negative for arthralgias, back pain, gait problem, joint swelling, myalgias and neck stiffness.  Skin: Negative for rash.  Neurological: Negative for dizziness, syncope, speech difficulty, weakness, numbness and  headaches.  Hematological: Negative for adenopathy. Does not bruise/bleed easily.  Psychiatric/Behavioral: Negative for behavioral problems and dysphoric mood. The patient is not nervous/anxious.        Objective:   Physical Exam  Constitutional: He is oriented to person, place, and time. He appears well-developed.  HENT:  Head: Normocephalic.  Right Ear: External ear normal.  Left Ear: External ear normal.  Eyes: Conjunctivae and EOM are normal.  Neck: Normal range of motion.  Cardiovascular: Normal rate and normal heart sounds.  Pulmonary/Chest: Breath sounds normal.  Abdominal: Bowel sounds are normal.  Musculoskeletal: Normal range of motion. He exhibits no edema or tenderness.  Neurological: He is alert and oriented to person, place, and time.  Psychiatric: He has a normal mood and affect. His behavior is normal.          Assessment & Plan:  Diabetes.  Well controlled we will continue present regimen.  Patient will check with his insurance which SGLT2 product is covered  Paroxysmal atrial fibrillation continue anticoagulation PD follow-up with neurology. Hypothyroidism continue levothyroxine  Follow-up 4-6  months  KWIATKOWSKI,PETER Pilar Plate

## 2017-08-04 NOTE — Patient Instructions (Signed)
Annual  examination and diabetic follow-up in 6 months  Please see your eye doctor yearly to check for diabetic eye damage

## 2017-08-05 ENCOUNTER — Other Ambulatory Visit: Payer: Self-pay | Admitting: Internal Medicine

## 2017-08-05 ENCOUNTER — Other Ambulatory Visit: Payer: Self-pay | Admitting: Neurology

## 2017-08-08 ENCOUNTER — Encounter: Payer: Self-pay | Admitting: Neurology

## 2017-08-08 ENCOUNTER — Telehealth: Payer: Self-pay

## 2017-08-08 ENCOUNTER — Encounter: Payer: Self-pay | Admitting: Internal Medicine

## 2017-08-08 NOTE — Telephone Encounter (Signed)
Dr. [name] is out of the office- he typically takes patients referred by other providers- however d/t Pt's extensive medical hx will send to him for approval when he returns.

## 2017-08-08 NOTE — Telephone Encounter (Signed)
Copied from [place] [phone]. Topic: Appointment Scheduling - Scheduling Inquiry for Clinic >> Aug 08, 2017 11:03 AM Margot Ables wrote: Reason for CRM: pt calling stating he was referred to Dr. [name] by Dr. Burnice Logan at [facility]. Pt was advised Dr. [name] is not currently accepting new pts but is requesting that we ask due to the recommendation of Dr. Raliegh Ip. Please call pt to advise.

## 2017-08-09 NOTE — Telephone Encounter (Signed)
Ok to transfer please schedule a OV

## 2017-08-11 NOTE — Telephone Encounter (Signed)
Called and LVM informing pt of Dr. Ethel Rana approval. Advised for them to call back and schedule a TOC appt.

## 2017-08-12 ENCOUNTER — Other Ambulatory Visit: Payer: Self-pay | Admitting: Internal Medicine

## 2017-08-13 ENCOUNTER — Telehealth: Payer: Self-pay | Admitting: Internal Medicine

## 2017-08-13 NOTE — Telephone Encounter (Signed)
Copied from [place] [phone]. Topic: Quick Communication - Rx Refill/Question >> Aug 13, 2017  6:52 PM Waylan Rocher, Lumin L wrote: Medication: Accu check guide glucose meter, accuc check guide test strips, accu check fastclick lancets  Has the patient contacted their pharmacy? Yes.   (Agent: If no, request that the patient contact the pharmacy for the refill.) (Agent: If yes, when and what did the pharmacy advise?)  Preferred Pharmacy (with phone number or street name): [facility] [place] [address] [place] [address] Phone: [phone] Fax: [phone]  Agent: Please be advised that RX refills may take up to 3 business days. We ask that you follow-up with your pharmacy.  Please call and give a verbal for the glucose testing supplies. Use [id number]

## 2017-08-13 NOTE — Telephone Encounter (Signed)
Appt scheduled 11/18/2017.

## 2017-08-15 ENCOUNTER — Telehealth: Payer: Self-pay

## 2017-08-15 MED ORDER — ACCU-CHEK GUIDE W/DEVICE KIT: 1.0000 | PACK | Freq: Every day | 0 refills | Status: DC

## 2017-08-15 MED ORDER — ACCU-CHEK SOFT TOUCH LANCETS MISC: 12 refills | Status: DC

## 2017-08-15 NOTE — Telephone Encounter (Signed)
Spoke to pt and he stated that his insurance would not cover the device kit that was given to pt in the office. A new glucometer kit that was approved by insurance has been sent in to pharmacy.

## 2017-08-20 NOTE — Telephone Encounter (Addendum)
Stephen Robbins w/[facility] [phone] ref # [id number] needs the patient's Lancets (ACCU-CHEK SOFT TOUCH) lancets replaced with Accu chek Fast quik Lancets. This is their third attempt to have this done.

## 2017-08-21 MED ORDER — ACCU-CHEK FASTCLIX LANCETS MISC: 1 refills | Status: DC

## 2017-08-21 NOTE — Addendum Note (Signed)
Addended by: Gwenyth Ober R on: 08/21/2017 08:57 AM   Modules accepted: Orders

## 2017-08-21 NOTE — Telephone Encounter (Signed)
Notified pt that the lancets has been sent in, Pt stated that he also was told by insurance that he needed to change an Rx but the doesn't remember which one. I informed pt that I will be looking out for the paper work.

## 2017-08-22 ENCOUNTER — Other Ambulatory Visit: Payer: Self-pay

## 2017-09-03 DIAGNOSIS — L570 Actinic keratosis: Secondary | ICD-10-CM | POA: Diagnosis not present

## 2017-09-03 DIAGNOSIS — L821 Other seborrheic keratosis: Secondary | ICD-10-CM | POA: Diagnosis not present

## 2017-09-03 DIAGNOSIS — D1801 Hemangioma of skin and subcutaneous tissue: Secondary | ICD-10-CM | POA: Diagnosis not present

## 2017-09-03 DIAGNOSIS — Z85828 Personal history of other malignant neoplasm of skin: Secondary | ICD-10-CM | POA: Diagnosis not present

## 2017-09-03 DIAGNOSIS — L218 Other seborrheic dermatitis: Secondary | ICD-10-CM | POA: Diagnosis not present

## 2017-09-04 DIAGNOSIS — H1131 Conjunctival hemorrhage, right eye: Secondary | ICD-10-CM | POA: Diagnosis not present

## 2017-09-13 ENCOUNTER — Other Ambulatory Visit: Payer: Self-pay | Admitting: Cardiovascular Disease

## 2017-09-13 ENCOUNTER — Other Ambulatory Visit: Payer: Self-pay | Admitting: Internal Medicine

## 2017-09-15 NOTE — Telephone Encounter (Signed)
Rx request sent to pharmacy.  

## 2017-09-17 NOTE — Telephone Encounter (Signed)
Sent to the pharmacy by e-scribe for 90 days.  Pt last seen on 08/04/17 and advised to follow up in 4-6 months.

## 2017-09-18 ENCOUNTER — Other Ambulatory Visit: Payer: Self-pay

## 2017-10-07 ENCOUNTER — Encounter: Payer: Self-pay | Admitting: Cardiovascular Disease

## 2017-10-07 ENCOUNTER — Ambulatory Visit (INDEPENDENT_AMBULATORY_CARE_PROVIDER_SITE_OTHER): Payer: Medicare Other | Admitting: Cardiovascular Disease

## 2017-10-07 ENCOUNTER — Encounter

## 2017-10-07 VITALS — BP 110/64 | HR 70 | Ht 69.0 in | Wt 174.0 lb

## 2017-10-07 DIAGNOSIS — I495 Sick sinus syndrome: Secondary | ICD-10-CM | POA: Diagnosis not present

## 2017-10-07 DIAGNOSIS — Z950 Presence of cardiac pacemaker: Secondary | ICD-10-CM | POA: Diagnosis not present

## 2017-10-07 DIAGNOSIS — I472 Ventricular tachycardia: Secondary | ICD-10-CM

## 2017-10-07 DIAGNOSIS — I480 Paroxysmal atrial fibrillation: Secondary | ICD-10-CM

## 2017-10-07 DIAGNOSIS — Z7901 Long term (current) use of anticoagulants: Secondary | ICD-10-CM

## 2017-10-07 DIAGNOSIS — I951 Orthostatic hypotension: Secondary | ICD-10-CM

## 2017-10-07 DIAGNOSIS — I4729 Other ventricular tachycardia: Secondary | ICD-10-CM

## 2017-10-07 DIAGNOSIS — E785 Hyperlipidemia, unspecified: Secondary | ICD-10-CM | POA: Diagnosis not present

## 2017-10-07 NOTE — Patient Instructions (Addendum)
Dr Sallyanne Kuster recommends that you continue on your current medications as directed. Please refer to the Current Medication list given to you today.  Your physician recommends that you return for lab work at your earliest [place].  Remote monitoring is used to monitor your Pacemaker or ICD from home. This monitoring reduces the number of office visits required to check your device to one time per year. It allows [place] to keep an eye on the functioning of your device to ensure it is working properly. You are scheduled for a device check from home on Tuesday, July 23rd, 2019. You may send your transmission at any time that day. If you have a wireless device, the transmission will be sent automatically. After your physician reviews your transmission, you will receive a notification with your next transmission date.  To improve our patient care and to more adequately follow your device, [facility] has decided, as a practice, to start following each patient four times a year with your home monitor. This means that you may experience a remote appointment that is close to an in-office appointment with your physician. Your insurance will apply at the same rate as other remote monitoring transmissions.  Dr Sallyanne Kuster recommends that you schedule a follow-up appointment in 12 months with a pacemaker check. You will receive a reminder letter in the mail two months in advance. If you don't receive a letter, please call our office to schedule the follow-up appointment.  If you need a refill on your cardiac medications before your next appointment, please call your pharmacy.

## 2017-10-08 NOTE — Progress Notes (Signed)
Patient ID: Stephen Robbins, male   DOB: 08-Oct-1935, 82 y.o.   MRN: [id number]    Cardiology Office Note    Date:  10/09/2017   ID:  Stephen Robbins, DOB 07/26/1935, MRN [id number]  PCP:  Marletta Lor, MD  Cardiologist:   Sanda Klein, MD   No chief complaint on file.   History of Present Illness:  Stephen Robbins is a 82 y.o. male with asymptomatic paroxysmal atrial fibrillation detected on pacemaker downloads, sinus node dysfunction with chronotropic incompetence, Parkinson's disease, diabetes mellitus, asymptomatic brief nonsustained VT detected on pacemaker.  He generally feels well his only cardiovascular complaint is orthostatic dizziness, especially when he stands up after his Parkinson disease exercises.  He has not experienced falls or syncope.  He denies palpitations, shortness of breath, chest pain, new focal neurological complaints or bleeding problems.  His device is a [employer] implanted in 2015. Device check today shows normal function. Estimated generator longevity is 12 years. There is 42% atrial pacing and only 2% ventricular pacing. Heart rate histogram distribution suggests appropriate sensor settings to compensate for his chronotropic incompetence.  He has infrequent and asymptomatic episodes of atrial fibrillation.  He had a 2.5-hour episode in May, electrogram looked fairly organized, probably atypical atrial flutter.  In July he had an episode that lasted for less than 3 minutes.  Rate control was not a problem.  He was not aware of these arrhythmic events.  He has not had any new episodes of nonsustained ventricular tachycardia that I can see.  Past Medical History:  Diagnosis Date  . Arthritis    "joints; a little" (01/04/2014)  . Asymptomatic bilateral carotid artery stenosis    per duplex  05-15-2012  left >39%/   right 40-59%  . Basal cell carcinoma    nose  . Borderline diabetes   . BPH (benign prostatic hypertrophy) with  urinary obstruction   . Bradycardia   . Bronchial pneumonia 1958  . Chronotropic incompetence with sinus node dysfunction (HCC)   . Compressed cervical disc   . Compression of lumbar vertebra (HCC)    L4 -- L5  . Dizzy   . Dysmetabolic syndrome   . Fatigue   . Frequency of urination   . GERD (gastroesophageal reflux disease)    occasional  . Hemorrhoids   . Hyperlipidemia   . Hypothyroidism   . Nocturia   . NSVT (nonsustained ventricular tachycardia) [facility])    cardiologist-  dr Christia Domke  . Pacemaker   . Urgency of urination   . Wears glasses     Past Surgical History:  Procedure Laterality Date  . BASAL CELL CARCINOMA EXCISION     nose  . CLOSED REDUCTION NASAL FRACTURE  09-01-2007  . CYSTOSCOPY N/A 12/28/2012   Procedure: CYSTOSCOPY;  Surgeon: Bernestine Amass, MD;  Location: [facility];  Service: Urology;  Laterality: N/A;  . EXERCISE TOLERENCE TEST  12-03-2012  DR CROITURO   CHRONOTROPIC INCOMPETENCE/ NORMAL RESTING BP W/ APPROPRIATE RESPONSE/ NO CHEST PAIN/ NO ST CHANGES FROM BASELINE  . INSERT / REPLACE / REMOVE PACEMAKER  01/04/2014   [employer] model L121 serial number [id number]  . LACERATION REPAIR Right 1978   middle finger  . NEUROPLASTY / TRANSPOSITION ULNAR NERVE AT ELBOW Left 02-09-2010  . PERMANENT PACEMAKER INSERTION N/A 01/04/2014   Procedure: PERMANENT PACEMAKER INSERTION;  Surgeon: Sanda Klein, MD;  Location: [facility];  Service: Cardiovascular;  Laterality: N/A;  . TONSILLECTOMY AND  ADENOIDECTOMY  1944  . TRANSURETHRAL INCISION OF PROSTATE N/A 12/28/2012   Procedure: TRANSURETHRAL INCISION OF THE PROSTATE (TUIP);  Surgeon: Bernestine Amass, MD;  Location[facility];  Service: Urology;  Laterality: N/A;  . ULNAR NERVE TRANSPOSITION      Current Medications: Outpatient Medications Prior to Visit  Medication Sig Dispense Refill  . ACCU-CHEK FASTCLIX LANCETS MISC USED TO CHECK BLOOD SUGAR DAILY OR PRN. 102  each 1  . bisacodyl (DULCOLAX) 5 MG EC tablet Take 5 mg daily as needed by mouth for moderate constipation.    . Blood Glucose Monitoring Suppl (ACCU-CHEK GUIDE) w/Device KIT 1 kit by Does not apply route daily. Used to check blood sugar daily and prn. 1 kit 0  . canagliflozin (INVOKANA) 300 MG TABS tablet TAKE ONE TABLET BY MOUTH DAILY BEFORE BREAKFAST 90 tablet 2  . carbidopa-levodopa (SINEMET IR) 25-100 MG tablet TAKE 1 AND 1/2 TABLETS 3   TIMES DAILY BEFORE MEALS 405 tablet 1  . fish oil-omega-3 fatty acids 1000 MG capsule Take 2 g by mouth daily.    Marland Kitchen gabapentin (NEURONTIN) 300 MG capsule TAKE 1 CAPSULE(300 MG) BY MOUTH TWICE DAILY 180 capsule 1  . glucose blood test strip Used to check blood sugar daily 100 each 12  . hydrocortisone (PROCTOZONE-HC) 2.5 % rectal cream Place 1 application rectally 2 (two) times daily. 30 g 5  . hydrocortisone cream 0.5 % Apply 1 application 2 (two) times daily as needed topically. rash 30 g 3  . ibuprofen (ADVIL,MOTRIN) 200 MG tablet Take 200 mg by mouth daily as needed for mild pain.     Marland Kitchen levothyroxine (SYNTHROID, LEVOTHROID) 75 MCG tablet Take 1 tablet (75 mcg total) daily by mouth. 90 tablet 3  . metFORMIN (GLUCOPHAGE-XR) 500 MG 24 hr tablet TAKE 2 TABLETS(1000 MG) BY MOUTH DAILY WITH BREAKFAST 180 tablet 0  . mirabegron ER (MYRBETRIQ) 25 MG TB24 tablet Take 25 mg by mouth daily.    Vladimir Faster Glycol-Propyl Glycol (SYSTANE ULTRA) 0.4-0.3 % SOLN Place 1 drop into both eyes daily as needed (dry eyes).     . polyethylene glycol powder (MIRALAX) powder Take 17 g daily by mouth. 255 g 0  . Pramoxine-HC (HYDROCORTISONE ACE-PRAMOXINE) 2.5-1 % CREA Apply rectally twice daily as needed 28.35 g 0  . sertraline (ZOLOFT) 25 MG tablet TAKE 1 TABLET BY MOUTH EVERY MORNING 30 tablet 0  . simvastatin (ZOCOR) 20 MG tablet Take 1 tablet (20 mg total) at bedtime by mouth. 90 tablet 3  . traMADol (ULTRAM) 50 MG tablet Take 1 tablet (50 mg total) every 6 (six) hours as needed  by mouth. 12 tablet 0  . vitamin B-12 (CYANOCOBALAMIN) 1000 MCG tablet Take 1,000 mcg by mouth every morning.     Alveda Reasons 20 MG TABS tablet TAKE 1 TABLET BY MOUTH DAILY 90 tablet 0  . dapagliflozin propanediol (FARXIGA) 10 MG TABS tablet Take 10 mg by mouth daily. (Patient not taking: Reported on 10/07/2017) 30 tablet 0  . sertraline (ZOLOFT) 25 MG tablet TAKE 1 TABLET BY MOUTH EVERY MORNING 30 tablet 0   No facility-administered medications prior to visit.      Allergies:   Penicillins   Social History   Socioeconomic History  . Marital status: Married    Spouse name: Not on file  . Number of children: 0  . Years of education: Not on file  . Highest education level: Not on file  Occupational History  . Occupation: retired  Employer: RETIRED    Comment: executive   Social Needs  . Financial resource strain: Not on file  . Food insecurity:    Worry: Not on file    Inability: Not on file  . Transportation needs:    Medical: Not on file    Non-medical: Not on file  Tobacco Use  . Smoking status: Former Smoker    Packs/day: 1.00    Years: 10.00    Pack years: 10.00    Types: Cigarettes    Last attempt to quit: 03/25/1968    Years since quitting: 49.5  . Smokeless tobacco: Never Used  Substance and Sexual Activity  . Alcohol use: Yes    Alcohol/week: 4.2 oz    Types: 7 Glasses of wine per week    Comment: 1 glass wine daily  . Drug use: No  . Sexual activity: Not on file  Lifestyle  . Physical activity:    Days per week: Not on file    Minutes per session: Not on file  . Stress: Not on file  Relationships  . Social connections:    Talks on phone: Not on file    Gets together: Not on file    Attends religious service: Not on file    Active member of club or organization: Not on file    Attends meetings of clubs or organizations: Not on file    Relationship status: Not on file  Other Topics Concern  . Not on file  Social History Narrative   Regular exercise            Family History:  The patient's family history includes Lung cancer in his mother; Stroke in his father.   ROS:   Please see the history of present illness.    ROS all other systems are reviewed and are negative   PHYSICAL EXAM:   VS:  BP 110/64   Pulse 70   Ht 5' 9" (1.753 m)   Wt 174 lb (78.9 kg)   BMI 25.70 kg/m   General: Alert, oriented x3, no distress, looks well Head: no evidence of trauma, PERRL, EOMI, no exophtalmos or lid lag, no myxedema, no xanthelasma; normal ears, nose and oropharynx Neck: normal jugular venous pulsations and no hepatojugular reflux; brisk carotid pulses without delay and no carotid bruits Chest: clear to auscultation, no signs of consolidation by percussion or palpation, normal fremitus, symmetrical and full respiratory excursions, healthy left subclavian pacemaker site Cardiovascular: normal position and quality of the apical impulse, regular rhythm, normal first and second heart sounds, no murmurs, rubs or gallops Abdomen: no tenderness or distention, no masses by palpation, no abnormal pulsatility or arterial bruits, normal bowel sounds, no hepatosplenomegaly Extremities: no clubbing, cyanosis or edema; 2+ radial, ulnar and brachial pulses bilaterally; 2+ right femoral, posterior tibial and dorsalis pedis pulses; 2+ left femoral, posterior tibial and dorsalis pedis pulses; no subclavian or femoral bruits Neurological: grossly nonfocal other than very fine resting tremor in both hands Psych: Normal mood and affect   Wt Readings from Last 3 Encounters:  10/07/17 174 lb (78.9 kg)  08/04/17 176 lb (79.8 kg)  06/17/17 174 lb (78.9 kg)      Studies/Labs Reviewed:   EKG:  EKG is ordered today.  It shows alternation between sinus rhythm and atrial paced rhythm with ventricular sensed beats.  Atypical intraventricular conduction delay most closely resembling the left anterior fascicular block, QTC 410 ms.   Recent Labs: 02/04/2017: ALT 10; BUN  22; Creatinine, Ser  0.79; Hemoglobin 15.5; Platelets 201.0; Potassium 3.8; Sodium 136; TSH 8.33   Lipid Panel    Component Value Date/Time   CHOL 171 02/04/2017 1207   TRIG 128.0 02/04/2017 1207   TRIG 83 02/20/2006 0838   HDL 37.90 (L) 02/04/2017 1207   CHOLHDL 5 02/04/2017 1207   VLDL 25.6 02/04/2017 1207   LDLCALC 107 (H) 02/04/2017 1207     ASSESSMENT:    1. Paroxysmal atrial fibrillation (HCC)   2. SSS (sick sinus syndrome) ([place])   3. NSVT (nonsustained ventricular tachycardia) ([place])   4. Pacemaker   5. Orthostatic hypotension   6. Long term (current) use of anticoagulants   7. Dyslipidemia      PLAN:  In order of problems listed above:  1. Atrial fibrillation: This is asymptomatic and generally well rate controlled. CHADSVasc score 3 (age, DM).  Antiarrhythmics are not justified 2. SSS: He is doing well with the current sensor settings, without any symptoms of chronotropic incompetence.  3. NSVT: None recorded since his last device check 4. PPM: Remote download every 3 months and yearly office follow-up 5. Orthostatic hypotension: This has many potential causes including age, Parkinson's disease, the use of carbidopa-levodopa, hypovolemia from SGLT2 inhibitor or SSRI.  Advised him to change positions slowly.  And very well hydrated, especially when he is with exercises. 6. Xarelto:  no bleeding problems. 7. HLP: Recheck lipid profile   Medication Adjustments/Labs and Tests Ordered: Current medicines are reviewed at length with the patient today.  Concerns regarding medicines are outlined above.  Medication changes, Labs and Tests ordered today are listed in the Patient Instructions below. Patient Instructions  Dr Sallyanne Kuster recommends that you continue on your current medications as directed. Please refer to the Current Medication list given to you today.  Your physician recommends that you return for lab work at your earliest [place].  Remote  monitoring is used to monitor your Pacemaker or ICD from home. This monitoring reduces the number of office visits required to check your device to one time per year. It allows [place] to keep an eye on the functioning of your device to ensure it is working properly. You are scheduled for a device check from home on Tuesday, July 23rd, 2019. You may send your transmission at any time that day. If you have a wireless device, the transmission will be sent automatically. After your physician reviews your transmission, you will receive a notification with your next transmission date.  To improve our patient care and to more adequately follow your device, [facility] has decided, as a practice, to start following each patient four times a year with your home monitor. This means that you may experience a remote appointment that is close to an in-office appointment with your physician. Your insurance will apply at the same rate as other remote monitoring transmissions.  Dr Sallyanne Kuster recommends that you schedule a follow-up appointment in 12 months with a pacemaker check. You will receive a reminder letter in the mail two months in advance. If you don't receive a letter, please call our office to schedule the follow-up appointment.  If you need a refill on your cardiac medications before your next appointment, please call your pharmacy.    Signed, Sanda Klein, MD  10/09/2017 3:01 PM    [facility] [place], [place], [place]  [address] Phone: [phone]; Fax: [phone]

## 2017-10-14 ENCOUNTER — Telehealth: Payer: Self-pay | Admitting: Cardiology

## 2017-10-14 ENCOUNTER — Ambulatory Visit (INDEPENDENT_AMBULATORY_CARE_PROVIDER_SITE_OTHER): Payer: Medicare Other | Admitting: *Deleted

## 2017-10-14 DIAGNOSIS — I495 Sick sinus syndrome: Secondary | ICD-10-CM

## 2017-10-14 NOTE — Telephone Encounter (Signed)
Spoke with pt and reminded pt of remote transmission that is due today. Pt verbalized understanding.   

## 2017-10-15 ENCOUNTER — Encounter: Payer: Self-pay | Admitting: Cardiology

## 2017-10-15 ENCOUNTER — Other Ambulatory Visit: Payer: Self-pay | Admitting: Internal Medicine

## 2017-10-16 ENCOUNTER — Encounter: Payer: Self-pay | Admitting: Cardiology

## 2017-10-16 NOTE — Progress Notes (Signed)
Remote pacemaker transmission.   

## 2017-10-24 ENCOUNTER — Encounter: Payer: Self-pay | Admitting: Internal Medicine

## 2017-10-27 MED ORDER — GLUCOSE BLOOD VI STRP: ORAL_STRIP | 12 refills | Status: DC

## 2017-11-04 NOTE — Progress Notes (Signed)
Stephen Robbins was seen today in f/u in the movement clinic.  He was dx with PD last visit, on 06/17/13.  He is on carbidopa/levodopa 25/100 tid.  He has no side effects with the carbidopa/levodopa, but states that "I just don't know what to expect."  He continues to have tremor, which can be bothersome for him.  He has had no falls.  No lightheadedness.  No near syncope.  No hallucinations.  He had a carotid ultrasound done since last visit.  There is 1-39% stenosis bilaterally.  He did have some lab work done since last visit.  His hemoglobin A1c has greatly improved.  It is 6.3 (was 7.9 and prior to that was 9.4). He just finished PT and is enrolled in the exercise class now.  He presents today with a long list of questions.  He again tape records our session.    11/19/13 update:  Pt is f/u regarding Parkinson's disease.  He is currently on carbidopa/levodopa 25/100, one tablet 3 times per day. He is accompanied by his wife, who supplements the history.   Patient denies any falls.  He denies hallucinations.  Denies nausea or vomiting.  No lightheadedness or near syncope.  He is exercising.  He still has tremor and isn't sure that the levodopa helps tremor.  He sent me several e-mails since last visit.  One of these was just a TV news link regarding [facility] high-resolution imaging and deep brain stimulation.  He subsequently sent another e-mail regarding focused ultrasound.  Asks about updates regarding these and has 2 pages of questions.  Asks about why we can't give him human growth hormone or steroids for PD.  Overall, patient is clinically doing well.   02/21/14 update:   Pt returns for f/u, accompanied by his wife who supplements the history.  The records that were made available to me were reviewed.  Pt has had PPM since last visit due to bradycardia.  He has been told not to exercise until 12/9.  He will start with PWR moves classes after the first of the year as well as circuit 2 classes.  He is still  on carbidopa/levodopa 25/100 at 8-9am/12-1pm/6-7 pm.  He is c/o paresthesias/"neuropathy" in the R foot.  He only notices it with driving.   He has no back pain.  He has no sensation of balance loss with closing eyes in the shower.  He thinks that exposure to levodopa causes the neuropathy and brings with him a small study (58 pts) that suggests that levodopa could be an etiology for the neuropathy.  Notices lack of smell.  No falls.  Rare lightheadedness. No hallucinations.    05/23/14 update:  Pt is f/u today, accompanied by his wife who supplements the history.  [facility] went to [facility] since our last visit and I reviewed those records.  He was told he had PN (known diagnosis) but no new meds were prescribed.  He asks about a "cure" for PN that a doctor in [place] was advertising.   He remains on carbidopa/levodopa 25/100 tid for the PD.  He is exercising faithfully.  He has trouble getting himself over 3 miles per hour when walking and he is concerned about that.  He asks again about taking steroids and human growth hormone.  He also asks about taking protein supplements.  He has d/c the glucosamine for joint pain/arthritis as he didn't think that it was helpful.    09/08/14 update:  The patient  is following up today.  He is accompanied by his wife who supplements the history.   Records were reviewed since last visit, from his primary care physician, podiatrist, as well as his cardiologist.  He remains on carbidopa/levodopa 25/100, one tablet 3 times per day.  He notices no wearing off.  He is doing PD circuit class.  He called his podiatrist since last visit and asked him about medications for neuropathy and he deferred to me.  We started gabapentin on May 30, 2014.  He is on 300 mg twice a day.  He states that his feet numbness is better.  He has continued to complain about fatigue.  His pacemaker was recently adjusted.  He is trying to walk/exercise at the park and his endurance has been better ever  since.  He again asks about energy.  He is back on testosterone for that.    01/12/15 update:  The patient returns today for follow-up, accompanied by his wife who supplements the history.  He remains on carbidopa/levodopa 25/100, one tablet 3 times per day.  Overall, the patient states that he is doing well.   Still has some intermittent tremor.  He denies any syncope.  He has had no falls since last visit.  He has had some near falls.  No hallucinations or visual distortions.  He is on gabapentin, 300 mg twice a day for peripheral neuropathy secondary to diabetes mellitus. Pt states that it is helping with the sx's, along with the "natural herb that I take."   He continues to faithfully exercise with the PWR classes.  Some lightheaded after exercise.  No new medical issues since last visit.    04/18/15 update:  The patient presents today, accompanied by his wife who supplements the history.  Last visit, increased the patient's carbidopa/levodopa 25/100, so that he is now taking 1-1/2 tablets 3 times per day.   When asked how he feel, he states, "I don't know how I am supposed to feel."  He denies stiffness.  He has minimal thumb tremor.   He is supposed to be on gabapentin, 300 mg twice a day for peripheral neuropathy but went down to q day because his feet weren't bothering him.  I have reviewed records made available to me since last visit.  He was diagnosed with atrial fibrillation and started on Xarelto.  He is doing well with that.  He denies any bleeding difficulties.  No falls.  No near syncope.  Rarely does he even feel unstable.  He is exercising.  Goes to the gym three times a week.    08/22/15 update:  The patient presents today, accompanied by his wife who supplements the history. He is on carbidopa/levodopa 25/100, 1-1/2 tablets 3 times per day.  He remains on gabapentin, 300 mg once a day for peripheral neuropathy (7-9pm). He states that he doesn't think that he can come off of it, at least until  he moves.  He did come to the PD support group lecture that I gave 2 weeks ago.  He denies hallucinations, lightheadedness, near syncope.  He does state that they are moving to [place] in assisted living.  States that there has been some stress with moving, as he is having to fix a hole that was discovered in the bathroom floor.   Asks me about alpha-lipoic acid and states that the people who sell it to him "think that it is good."    12/26/15 update:  The patient follows up today, accompanied  by his wife who supplements the history.  He remains on carbidopa/levodopa 25/100, 1-1/2 tablets 3 times per day.  He is still on gabapentin, 300 mg at night for neuropathy.  States that he doesn't want to try and d/c that.    He has since moved to [place] and they are adjusting well.  They have sold their house.  He is doing a [employer] class.  No falls.  Some near falls.    04/29/16 update:  Patient follows up today, accompanied by his wife who supplements the history.  Patient is on carbidopa/levodopa 25/100, 1.5 tablets 3 times per day (8am/noon/5pm).  Pt denies falls.  Pt denies lightheadedness, near syncope.  No hallucinations.  Mood has been "up and down."  Occasional bouts of constipation vs diarrhea.   On gabapentin 300 q hs for PN.  Did OT/ST since our last visit and reviewed those notes.  Doing PWR moves classes on wednesdays.    09/03/16 update:  Patient seen today in follow-up, accompanied by his wife who supplements the history.  Patient remains on carbidopa/levodopa 25/100 25/100, 1-1/2 tablets at 8 AM/noon/5 PM.  He has had no falls.  No lightheadedness or near syncope.  No hallucinations.  Remains on gabapentin, but now on 300 mg bid.  Seems to be doing better with the neuropathic sx's now.  The records that were made available to me were reviewed.  Metformin started by pcp as HgBA1C was rising.  Has some constipation.  He pulled me to the side and states that he is slightly more depressed dealing with  his wifes alzheimers.  No SI/HI.    02/07/17 update: Patient is seen today in follow-up.  He is accompanied by his wife who supplements the history.  Patient is on carbidopa/levodopa 25/100, 1-1/2 tablets 3 times per day.  Patient has had no falls since last visit.  No hallucinations.  He is on gabapentin, 300 mg twice per day for paresthesias.  This seems to be helping and is well controlled and he doesn't even always take the evening gabapentin dosage.  He has had no lightheadedness or near syncope.  He continues to have some stress associated with caregiving for his wife who has dementia.  Records have been reviewed since last visit.  He was in the emergency room twice since last visit, once for constipation and once for low back pain.  When asked how much water he was drinking he stated "obviously not enough."  He has not tried the rancho recipe and doesn't remember that I gave it to him.  Asks for another copy.  He had labwork few days ago and reports that his thyroid medication was adjusted to account for high TSH.   06/17/17 update: Patient is seen today in follow-up.  The patient is accompanied by his wife who supplements the history.  The patient is doing well on carbidopa/levodopa 25/100, 1.5 tablets 3 times per day.  No falls, no hallucinations.  No lightheadedness or near syncope.  Records have been reviewed since our last visit.  He went to the [employer] support group but is somewhat frustrated at the time of it.  He is following rancho recipe and uses some miralax.  States that he drinks about 30 oz per day of water.  Still with some constipation.  Asks about special diets for PD.    11/06/17 update: Patient is seen today in follow-up for Parkinson's disease.  He is accompanied by his wife who supplements the history.  Patient  is on carbidopa/levodopa 25/100, 1.5 tablets 3 times per day.  Recommended speech therapy last visit, but he wanted to go in [facility] and if there were no LSVT loud  certified therapist (which he had desired).  He has had no falls since last visit.  Uses cane prn (after first awakens).  No lightheadedness or near syncope except a little lightheaded when he finishes exercise.  Does PWR Moves class in [place].  No hallucinations.  I have reviewed records since our last visit.  He saw cardiology on October 07, 2017.  No changes were made to medications.  He is transferring care to Dr. [name] as that is a closer location for him.  He has an appointment on August 27 with him.  Last dermatology visit: late spring, 2019.  C/o constipation.    No neuroimaging since CT brain in 2009.  PREVIOUS MEDICATIONS: none to date  ALLERGIES:   Allergies  Allergen Reactions  . Penicillins Other (See Comments)    Unknown childhood reaction    CURRENT MEDICATIONS:  Current Outpatient Medications on File Prior to Visit  Medication Sig Dispense Refill  . ACCU-CHEK FASTCLIX LANCETS MISC USED TO CHECK BLOOD SUGAR DAILY OR PRN. 102 each 1  . bisacodyl (DULCOLAX) 5 MG EC tablet Take 5 mg daily as needed by mouth for moderate constipation.    . Blood Glucose Monitoring Suppl (ACCU-CHEK GUIDE) w/Device KIT 1 kit by Does not apply route daily. Used to check blood sugar daily and prn. 1 kit 0  . canagliflozin (INVOKANA) 300 MG TABS tablet TAKE ONE TABLET BY MOUTH DAILY BEFORE BREAKFAST 90 tablet 2  . carbidopa-levodopa (SINEMET IR) 25-100 MG tablet TAKE 1 AND 1/2 TABLETS 3   TIMES DAILY BEFORE MEALS 405 tablet 1  . fish oil-omega-3 fatty acids 1000 MG capsule Take 2 g by mouth daily.    Marland Kitchen gabapentin (NEURONTIN) 300 MG capsule TAKE 1 CAPSULE(300 MG) BY MOUTH TWICE DAILY 180 capsule 1  . glucose blood test strip Used to check blood sugar 3 times daily or prn. 100 each 12  . hydrocortisone (PROCTOZONE-HC) 2.5 % rectal cream Place 1 application rectally 2 (two) times daily. 30 g 5  . hydrocortisone cream 0.5 % Apply 1 application 2 (two) times daily as needed topically. rash 30 g 3  .  ibuprofen (ADVIL,MOTRIN) 200 MG tablet Take 200 mg by mouth daily as needed for mild pain.     Marland Kitchen levothyroxine (SYNTHROID, LEVOTHROID) 75 MCG tablet Take 1 tablet (75 mcg total) daily by mouth. 90 tablet 3  . metFORMIN (GLUCOPHAGE-XR) 500 MG 24 hr tablet TAKE 2 TABLETS(1000 MG) BY MOUTH DAILY WITH BREAKFAST 180 tablet 0  . mirabegron ER (MYRBETRIQ) 25 MG TB24 tablet Take 25 mg by mouth daily.    Vladimir Faster Glycol-Propyl Glycol (SYSTANE ULTRA) 0.4-0.3 % SOLN Place 1 drop into both eyes daily as needed (dry eyes).     . polyethylene glycol powder (MIRALAX) powder Take 17 g daily by mouth. 255 g 0  . Pramoxine-HC (HYDROCORTISONE ACE-PRAMOXINE) 2.5-1 % CREA Apply rectally twice daily as needed 28.35 g 0  . sertraline (ZOLOFT) 25 MG tablet TAKE 1 TABLET BY MOUTH EVERY MORNING 90 tablet 0  . simvastatin (ZOCOR) 20 MG tablet Take 1 tablet (20 mg total) at bedtime by mouth. 90 tablet 3  . traMADol (ULTRAM) 50 MG tablet Take 1 tablet (50 mg total) every 6 (six) hours as needed by mouth. 12 tablet 0  . vitamin B-12 (CYANOCOBALAMIN) 1000 MCG  tablet Take 1,000 mcg by mouth every morning.     Alveda Reasons 20 MG TABS tablet TAKE 1 TABLET BY MOUTH DAILY 90 tablet 0   No current facility-administered medications on file prior to visit.     PAST MEDICAL HISTORY:   Past Medical History:  Diagnosis Date  . Arthritis    "joints; a little" (01/04/2014)  . Asymptomatic bilateral carotid artery stenosis    per duplex  05-15-2012  left >39%/   right 40-59%  . Basal cell carcinoma    nose  . Borderline diabetes   . BPH (benign prostatic hypertrophy) with urinary obstruction   . Bradycardia   . Bronchial pneumonia 1958  . Chronotropic incompetence with sinus node dysfunction (HCC)   . Compressed cervical disc   . Compression of lumbar vertebra (HCC)    L4 -- L5  . Dizzy   . Dysmetabolic syndrome   . Fatigue   . Frequency of urination   . GERD (gastroesophageal reflux disease)    occasional  . Hemorrhoids    . Hyperlipidemia   . Hypothyroidism   . Nocturia   . NSVT (nonsustained ventricular tachycardia) [facility])    cardiologist-  dr croitoru  . Pacemaker   . Urgency of urination   . Wears glasses     PAST SURGICAL HISTORY:   Past Surgical History:  Procedure Laterality Date  . BASAL CELL CARCINOMA EXCISION     nose  . CLOSED REDUCTION NASAL FRACTURE  09-01-2007  . CYSTOSCOPY N/A 12/28/2012   Procedure: CYSTOSCOPY;  Surgeon: Bernestine Amass, MD;  Location: [facility];  Service: Urology;  Laterality: N/A;  . EXERCISE TOLERENCE TEST  12-03-2012  DR CROITURO   CHRONOTROPIC INCOMPETENCE/ NORMAL RESTING BP W/ APPROPRIATE RESPONSE/ NO CHEST PAIN/ NO ST CHANGES FROM BASELINE  . INSERT / REPLACE / REMOVE PACEMAKER  01/04/2014   [employer] model L121 serial number [id number]  . LACERATION REPAIR Right 1978   middle finger  . NEUROPLASTY / TRANSPOSITION ULNAR NERVE AT ELBOW Left 02-09-2010  . PERMANENT PACEMAKER INSERTION N/A 01/04/2014   Procedure: PERMANENT PACEMAKER INSERTION;  Surgeon: Sanda Klein, MD;  Location: [facility] [facility];  Service: Cardiovascular;  Laterality: N/A;  . TONSILLECTOMY AND ADENOIDECTOMY  1944  . TRANSURETHRAL INCISION OF PROSTATE N/A 12/28/2012   Procedure: TRANSURETHRAL INCISION OF THE PROSTATE (TUIP);  Surgeon: Bernestine Amass, MD;  Location: [facility];  Service: Urology;  Laterality: N/A;  . ULNAR NERVE TRANSPOSITION      SOCIAL HISTORY:   Social History   Socioeconomic History  . Marital status: Married    Spouse name: Not on file  . Number of children: 0  . Years of education: Not on file  . Highest education level: Not on file  Occupational History  . Occupation: retired    Fish farm manager: RETIRED    Comment: executive   Social Needs  . Financial resource strain: Not on file  . Food insecurity:    Worry: Not on file    Inability: Not on file  . Transportation needs:    Medical: Not on file    Non-medical: Not on  file  Tobacco Use  . Smoking status: Former Smoker    Packs/day: 1.00    Years: 10.00    Pack years: 10.00    Types: Cigarettes    Last attempt to quit: 03/25/1968    Years since quitting: 49.6  . Smokeless tobacco: Never Used  Substance and Sexual Activity  .  Alcohol use: Yes    Alcohol/week: 7.0 standard drinks    Types: 7 Glasses of wine per week    Comment: 1 glass wine daily  . Drug use: No  . Sexual activity: Not on file  Lifestyle  . Physical activity:    Days per week: Not on file    Minutes per session: Not on file  . Stress: Not on file  Relationships  . Social connections:    Talks on phone: Not on file    Gets together: Not on file    Attends religious service: Not on file    Active member of club or organization: Not on file    Attends meetings of clubs or organizations: Not on file    Relationship status: Not on file  . Intimate partner violence:    Fear of current or ex partner: Not on file    Emotionally abused: Not on file    Physically abused: Not on file    Forced sexual activity: Not on file  Other Topics Concern  . Not on file  Social History Narrative   Regular exercise          FAMILY HISTORY:   Family Status  Relation Name Status  . Father  Deceased       stroke  . Mother  Deceased       lung cancer  . Brother  Alive       healthy    ROS:    A complete 10 system review of systems was obtained and was unremarkable apart from what is mentioned above.  PHYSICAL EXAMINATION:    VITALS:   Vitals:   11/06/17 1301  BP: 110/60  Pulse: 84  Weight: 173 lb (78.5 kg)  Height: '5\' 9"'$  (1.753 m)   Wt Readings from Last 3 Encounters:  11/06/17 173 lb (78.5 kg)  10/07/17 174 lb (78.9 kg)  08/04/17 176 lb (79.8 kg)    GEN:  The patient appears stated age and is in NAD. HEENT:  Normocephalic, atraumatic.  The mucous membranes are moist. The superficial temporal arteries are without ropiness or tenderness. CV:  RRR Lungs:  CTAB Neck/HEME:   There are no carotid bruits bilaterally.  Neurological examination:  Orientation: The patient is alert and oriented x3. Cranial nerves: There is good facial symmetry. The speech is fluent and clear. Soft palate rises symmetrically and there is no tongue deviation. Hearing is intact to conversational tone. Sensation: Sensation is intact to light touch throughout Motor: Strength is 5/5 in the bilateral upper and lower extremities.   Shoulder shrug is equal and symmetric.  There is no pronator drift.  Movement examination: Tone: There is mild rigidity in the LUE Abnormal movements: There is no tremor today Coordination:  There are mild trouble with finger taps on the left Gait and Station: The patient has no difficulty arising out of a deep-seated chair without the use of the hands.  He has mild start hesitation.  Posture is stooped.  He is slower than in the past.  He is short stepped.    LABS:  Lab Results  Component Value Date   TSH 8.33 (H) 02/04/2017   Lab Results  Component Value Date   VITAMINB12 872 06/17/2013     Chemistry      Component Value Date/Time   NA 136 02/04/2017 1207   K 3.8 02/04/2017 1207   CL 100 02/04/2017 1207   CO2 28 02/04/2017 1207   BUN 22 02/04/2017 1207  CREATININE 0.79 02/04/2017 1207   CREATININE 0.77 12/29/2013 1627      Component Value Date/Time   CALCIUM 9.5 02/04/2017 1207   ALKPHOS 39 02/04/2017 1207   AST 19 02/04/2017 1207   ALT 10 02/04/2017 1207   BILITOT 0.9 02/04/2017 1207     RPR negative Lab Results  Component Value Date   FOLATE >20.0 06/17/2013     Lab Results  Component Value Date   HGBA1C 6.6 08/04/2017    ASSESSMENT/PLAN:  1. idiopathic Parkinson's disease.  I see no atypical features.    -increase carbidopa/levodopa 25/100, 2 po tid.  Is mild rigidity on the L and just a bit slower than in the past.    -seeing dermatology yearly.  PD increases risk of melanoma.    -asked about speech therapy.  Told him that  we researched and no LSVT in [place].  However, if wants to attend regular ST, he will let me know.  He will think about it.   -asks me about DBS.  He doesn't need it right now.   2.  Peripheral neuropathy  -There is evidence of this on examination.  Likely from DM.  His workup for reversible causes was negative.  His diabetes is under much better control than in the past.  -currently on gabapentin - 1 -2 times per day.  He does wish to continue given paresthesias in the feet. 3.  Hx of transaminasemia  -These were elevated in 2012 but have improved 4.  Constipation  -continue hydration but increase water intake.  He doesn't want to take miralax regularly.  Talked about adding colace.   5.  Caregiver stress  -talked about stress with caregiving for wife with AD  -attending support group at [facility].  Hoping to add further exercise at [employer].   6.  Follow up is anticipated in the next few months, sooner should new neurologic issues arise.  Much greater than 50% of this visit was spent in counseling and coordinating care.  Total face to face time:  25 min

## 2017-11-06 ENCOUNTER — Ambulatory Visit (INDEPENDENT_AMBULATORY_CARE_PROVIDER_SITE_OTHER): Payer: Medicare Other | Admitting: Neurology

## 2017-11-06 ENCOUNTER — Encounter: Payer: Self-pay | Admitting: Neurology

## 2017-11-06 VITALS — BP 110/60 | HR 84 | Ht 69.0 in | Wt 173.0 lb

## 2017-11-06 DIAGNOSIS — K5901 Slow transit constipation: Secondary | ICD-10-CM

## 2017-11-06 DIAGNOSIS — G20 Parkinson's disease: Secondary | ICD-10-CM | POA: Diagnosis not present

## 2017-11-06 NOTE — Patient Instructions (Addendum)
Increase carbidopa/levodopa 25/100 to 2 tablets three times per day  Constipation and Parkinson's disease:  1.Rancho recipe for constipation in Parkinsons Disease:  -1 cup of unprocessed bran (need to get this at [employer], [employer] or similar type of store), 2 cups of applesauce in 1 cup of prune juice 2.  Increase fiber intake (Metamucil,vegetables) 3.  Regular, moderate exercise can be beneficial. 4.  Avoid medications causing constipation, such as medications like antacids with calcium or magnesium 5.  Laxative overuse should be avoided. 6.  Stool softeners (Colace) can help with chronic constipation. 7.  Increase water intake.  You should be drinking 1/2 gallon of water a day as long as you have not been diagnosed with congestive heart failure or renal/kidney failure.  This is probably the single greatest thing that you can do to help your constipation.  

## 2017-11-07 LAB — CUP PACEART REMOTE DEVICE CHECK
Battery Remaining Longevity: 120 mo
Battery Remaining Percentage: 100 %
Brady Statistic RA Percent Paced: 38 %
Brady Statistic RV Percent Paced: 1 %
Date Time Interrogation Session: 20190723190900
Implantable Lead Implant Date: 20151013
Implantable Lead Implant Date: 20151013
Implantable Lead Location: 753859
Implantable Lead Location: 753860
Implantable Lead Model: 4135
Implantable Lead Model: 4136
Implantable Lead Serial Number: 29480373
Implantable Lead Serial Number: 29608302
Implantable Pulse Generator Implant Date: 20151013
Lead Channel Impedance Value: 551 Ohm
Lead Channel Impedance Value: 644 Ohm
Lead Channel Setting Pacing Amplitude: 2 V
Lead Channel Setting Pacing Amplitude: 4 V
Lead Channel Setting Pacing Pulse Width: 1 ms
Lead Channel Setting Sensing Sensitivity: 2.5 mV
Pulse Gen Serial Number: 702951

## 2017-11-13 ENCOUNTER — Telehealth: Payer: Self-pay | Admitting: Neurology

## 2017-11-13 LAB — CUP PACEART INCLINIC DEVICE CHECK
Date Time Interrogation Session: 20190822143957
Implantable Lead Implant Date: 20151013
Implantable Lead Implant Date: 20151013
Implantable Lead Location: 753859
Implantable Lead Location: 753860
Implantable Lead Model: 4135
Implantable Lead Model: 4136
Implantable Lead Serial Number: 29480373
Implantable Lead Serial Number: 29608302
Implantable Pulse Generator Implant Date: 20151013
Lead Channel Setting Pacing Amplitude: 2 V
Lead Channel Setting Pacing Amplitude: 4 V
Lead Channel Setting Pacing Pulse Width: 1 ms
Lead Channel Setting Sensing Sensitivity: 2.5 mV
Pulse Gen Serial Number: 702951

## 2017-11-13 MED ORDER — CARBIDOPA-LEVODOPA 25-100 MG PO TABS: 2.0000 | ORAL_TABLET | Freq: Three times a day (TID) | ORAL | 1 refills | Status: DC

## 2017-11-13 NOTE — Telephone Encounter (Signed)
Patient states that we increased his Caridopa levidopa up to 2 pills 3xdaily. We need to call in the new RX to the [facility] on [address] in [place]

## 2017-11-13 NOTE — Telephone Encounter (Signed)
RX sent as requested 

## 2017-11-18 ENCOUNTER — Ambulatory Visit (INDEPENDENT_AMBULATORY_CARE_PROVIDER_SITE_OTHER): Payer: Medicare Other | Admitting: Internal Medicine

## 2017-11-18 ENCOUNTER — Encounter: Payer: Self-pay | Admitting: Internal Medicine

## 2017-11-18 VITALS — BP 102/60 | HR 67 | Temp 98.0°F | Resp 16 | Ht 67.0 in | Wt 174.0 lb

## 2017-11-18 DIAGNOSIS — E034 Atrophy of thyroid (acquired): Secondary | ICD-10-CM | POA: Diagnosis not present

## 2017-11-18 DIAGNOSIS — I480 Paroxysmal atrial fibrillation: Secondary | ICD-10-CM | POA: Diagnosis not present

## 2017-11-18 DIAGNOSIS — E119 Type 2 diabetes mellitus without complications: Secondary | ICD-10-CM | POA: Diagnosis not present

## 2017-11-18 DIAGNOSIS — G20 Parkinson's disease: Secondary | ICD-10-CM

## 2017-11-18 LAB — BASIC METABOLIC PANEL
BUN: 18 mg/dL (ref 6–23)
CO2: 26 mEq/L (ref 19–32)
Calcium: 10.1 mg/dL (ref 8.4–10.5)
Chloride: 101 mEq/L (ref 96–112)
Creatinine, Ser: 0.79 mg/dL (ref 0.40–1.50)
GFR: 100.22 mL/min (ref >60.00)
Glucose, Bld: 126 mg/dL — ABNORMAL HIGH (ref 70–99)
Potassium: 3.9 mEq/L (ref 3.5–5.1)
Sodium: 137 mEq/L (ref 135–145)

## 2017-11-18 LAB — TSH: TSH: 3.21 u[IU]/mL (ref 0.35–4.50)

## 2017-11-18 MED ORDER — KETOCONAZOLE 2 % EX CREA: 1.0000 "application " | TOPICAL_CREAM | Freq: Every day | CUTANEOUS | 0 refills | Status: DC

## 2017-11-18 NOTE — Progress Notes (Signed)
Subjective:    Patient ID: Stephen Robbins, male    DOB: June 10, 1935, 82 y.o.   MRN: [id number]  DOS:  11/18/2017 Type of visit - description : New patient, transferring from Dr. Raliegh Ip Interval history: DM: Good compliance with medication. No recent ambulatory CBGs Chronic constipation, has tried Dulcolax, MiraLAX, concern about long-term use of medication.  Both of them work for him. Hypothyroidism: Reports good compliance with medication. Urinary incontinence: Symptoms at baseline, due to see urology Noted a rash at the left armpit, burning, onset 2 days ago.   Review of Systems Denies chest pain or difficulty breathing at this point No nausea, vomiting. Past Medical History:  Diagnosis Date  . Arthritis    "joints; a little" (01/04/2014)  . Asymptomatic bilateral carotid artery stenosis    per duplex  05-15-2012  left >39%/   right 40-59%  . Basal cell carcinoma    nose  . Borderline diabetes   . BPH (benign prostatic hypertrophy) with urinary obstruction   . Bradycardia   . Bronchial pneumonia 1958  . Chronotropic incompetence with sinus node dysfunction (HCC)   . Compressed cervical disc   . Compression of lumbar vertebra (HCC)    L4 -- L5  . Dizzy   . Dysmetabolic syndrome   . Fatigue   . Frequency of urination   . GERD (gastroesophageal reflux disease)    occasional  . Hemorrhoids   . Hyperlipidemia   . Hypothyroidism   . Nocturia   . NSVT (nonsustained ventricular tachycardia) [facility])    cardiologist-  dr croitoru  . Pacemaker   . Urgency of urination   . Wears glasses     Past Surgical History:  Procedure Laterality Date  . BASAL CELL CARCINOMA EXCISION     nose  . CLOSED REDUCTION NASAL FRACTURE  09-01-2007  . CYSTOSCOPY N/A 12/28/2012   Procedure: CYSTOSCOPY;  Surgeon: Bernestine Amass, MD;  Location: [facility];  Service: Urology;  Laterality: N/A;  . EXERCISE TOLERENCE TEST  12-03-2012  DR CROITURO   CHRONOTROPIC INCOMPETENCE/ NORMAL RESTING  BP W/ APPROPRIATE RESPONSE/ NO CHEST PAIN/ NO ST CHANGES FROM BASELINE  . INSERT / REPLACE / REMOVE PACEMAKER  01/04/2014   [employer] model L121 serial number [id number]  . LACERATION REPAIR Right 1978   middle finger  . NEUROPLASTY / TRANSPOSITION ULNAR NERVE AT ELBOW Left 02-09-2010  . PERMANENT PACEMAKER INSERTION N/A 01/04/2014   Procedure: PERMANENT PACEMAKER INSERTION;  Surgeon: Sanda Klein, MD;  Location: [facility];  Service: Cardiovascular;  Laterality: N/A;  . TONSILLECTOMY AND ADENOIDECTOMY  1944  . TRANSURETHRAL INCISION OF PROSTATE N/A 12/28/2012   Procedure: TRANSURETHRAL INCISION OF THE PROSTATE (TUIP);  Surgeon: Bernestine Amass, MD;  Location: [facility];  Service: Urology;  Laterality: N/A;  . ULNAR NERVE TRANSPOSITION      Social History   Socioeconomic History  . Marital status: Married    Spouse name: Not on file  . Number of children: 0  . Years of education: Not on file  . Highest education level: Not on file  Occupational History  . Occupation: retired    Fish farm manager: RETIRED    Comment: executive   Social Needs  . Financial resource strain: Not on file  . Food insecurity:    Worry: Not on file    Inability: Not on file  . Transportation needs:    Medical: Not on file    Non-medical: Not on file  Tobacco  Use  . Smoking status: Former Smoker    Packs/day: 1.00    Years: 10.00    Pack years: 10.00    Types: Cigarettes    Last attempt to quit: 03/25/1968    Years since quitting: 49.6  . Smokeless tobacco: Never Used  Substance and Sexual Activity  . Alcohol use: Yes    Alcohol/week: 7.0 standard drinks    Types: 7 Glasses of wine per week    Comment: 1 glass wine daily  . Drug use: No  . Sexual activity: Not on file  Lifestyle  . Physical activity:    Days per week: Not on file    Minutes per session: Not on file  . Stress: Not on file  Relationships  . Social connections:    Talks on phone: Not on file    Gets  together: Not on file    Attends religious service: Not on file    Active member of club or organization: Not on file    Attends meetings of clubs or organizations: Not on file    Relationship status: Not on file  . Intimate partner violence:    Fear of current or ex partner: Not on file    Emotionally abused: Not on file    Physically abused: Not on file    Forced sexual activity: Not on file  Other Topics Concern  . Not on file  Social History Narrative   Lives at [place] as of 11/18/2017      Reactions   Penicillins Other (See Comments)   Unknown childhood reaction      Medication List        Accurate as of 11/18/17  7:00 PM. Always use your most recent med list.          ACCU-CHEK FASTCLIX LANCETS Misc USED TO CHECK BLOOD SUGAR DAILY OR PRN.   ACCU-CHEK GUIDE w/Device Kit 1 kit by Does not apply route daily. Used to check blood sugar daily and prn.   bisacodyl 5 MG EC tablet Commonly known as:  DULCOLAX Take 5 mg daily as needed by mouth for moderate constipation.   canagliflozin 300 MG Tabs tablet Commonly known as:  INVOKANA TAKE ONE TABLET BY MOUTH DAILY BEFORE BREAKFAST   carbidopa-levodopa 25-100 MG tablet Commonly known as:  SINEMET IR Take 2 tablets by mouth 3 (three) times daily.   fish oil-omega-3 fatty acids 1000 MG capsule Take 2 g by mouth daily.   gabapentin 300 MG capsule Commonly known as:  NEURONTIN TAKE 1 CAPSULE(300 MG) BY MOUTH TWICE DAILY   glucose blood test strip Used to check blood sugar 3 times daily or prn.   hydrocortisone 2.5 % rectal cream Commonly known as:  ANUSOL-HC Place 1 application rectally 2 (two) times daily.   Hydrocortisone Ace-Pramoxine 2.5-1 % Crea Apply rectally twice daily as needed   hydrocortisone cream 0.5 % Apply 1 application 2 (two) times daily as needed topically. rash   ketoconazole 2 % cream Commonly known as:  NIZORAL Apply 1 application topically daily.   levothyroxine 75  MCG tablet Commonly known as:  SYNTHROID, LEVOTHROID Take 1 tablet (75 mcg total) daily by mouth.   metFORMIN 500 MG 24 hr tablet Commonly known as:  GLUCOPHAGE-XR TAKE 2 TABLETS(1000 MG) BY MOUTH DAILY WITH BREAKFAST   MYRBETRIQ 25 MG Tb24 tablet Generic drug:  mirabegron ER Take 25 mg by mouth daily.   polyethylene glycol powder powder Commonly known as:  GLYCOLAX/MIRALAX Take 17 g daily by mouth.   sertraline 25 MG tablet Commonly known as:  ZOLOFT TAKE 1 TABLET BY MOUTH EVERY MORNING   simvastatin 20 MG tablet Commonly known as:  ZOCOR Take 1 tablet (20 mg total) at bedtime by mouth.   SYSTANE ULTRA 0.4-0.3 % Soln Generic drug:  Polyethyl Glycol-Propyl Glycol Place 1 drop into both eyes daily as needed (dry eyes).   vitamin B-12 1000 MCG tablet Commonly known as:  CYANOCOBALAMIN Take 1,000 mcg by mouth every morning.   XARELTO 20 MG Tabs tablet Generic drug:  rivaroxaban TAKE 1 TABLET BY MOUTH DAILY          Objective:   Physical Exam BP 102/60 (BP Location: Right Arm, Cuff Size: Normal)   Pulse 67   Temp 98 F (36.7 C) (Oral)   Resp 16   Ht 5' 7"  (1.702 m)   Wt 174 lb (78.9 kg)   SpO2 96%   BMI 27.25 kg/m   General:   Well developed, NAD, see BMI.  HEENT:  Normocephalic . Face symmetric, atraumatic Lungs:  CTA B Normal respiratory effort, no intercostal retractions, no accessory muscle use. Heart: RRR,  no murmur.  no pretibial edema bilaterally  Abdomen:  Not distended, soft, non-tender. No rebound or rigidity.   Skin: oval erythematous lesion on the left armpit, erythema more intense in the borders.  No blisters. Neurologic:  alert & oriented X3.  Speech normal, gait appropriate for age and unassisted Psych--  Cognition and judgment appear intact.  Cooperative with normal attention span and concentration.  Behavior appropriate. No anxious or depressed appearing.     Assessment & Plan:    Assessment. New patient 11/18/17, transferring  from Dr. Raliegh Ip Diabetes Hypothyroidism Depression Hyperlipidemia CV: Paroxysmal A. fib, sick sinus syndrome,  pacemaker, NSVT, orthostatic hypotension, on Xarelto Hypogonadism hypogonadism NEURO: Dr [name] --Parkinson disease --Peripheral neuropathy Hemorrhoids  Caregiver fatigue  PLAN Diabetes: Last A1c 6.6, check a BMP, continue Invokana and metformin. Hypothyroidism: On Synthroid, check a TSH Constipation: Seems like MiraLAX works for him, okay to take it long-term.  Also encouraged high-fiber diet. Urinary incontinence: Recommend to see urology Paroxysmal A. fib: On Xarelto, advised not to take ibuprofen for aches and pains rather Tylenol Rash: Likely fungal, recommend Nizoral twice a day for 1 week, call if not better Parkinson, neuropathy: Patient has issues with his voice, ?d/t parkinson, would like to see speech therapist, previously saw ST at the neurology office but that far away from home.  We will try to arrange a ST close to [facility] RTC 2 months

## 2017-11-18 NOTE — Patient Instructions (Signed)
GO TO THE LAB : Get the blood work     GO TO THE FRONT DESK Schedule your next appointment for a checkup in 2 months

## 2017-12-15 ENCOUNTER — Other Ambulatory Visit: Payer: Self-pay | Admitting: Neurology

## 2017-12-15 MED ORDER — GABAPENTIN 300 MG PO CAPS: ORAL_CAPSULE | ORAL | 1 refills | Status: DC

## 2017-12-22 ENCOUNTER — Other Ambulatory Visit: Payer: Self-pay | Admitting: Cardiovascular Disease

## 2017-12-22 ENCOUNTER — Other Ambulatory Visit: Payer: Self-pay

## 2017-12-22 MED ORDER — GABAPENTIN 300 MG PO CAPS: ORAL_CAPSULE | ORAL | 1 refills | Status: DC

## 2017-12-23 ENCOUNTER — Other Ambulatory Visit: Payer: Self-pay | Admitting: Cardiovascular Disease

## 2017-12-23 MED ORDER — RIVAROXABAN 20 MG PO TABS: 20.0000 mg | ORAL_TABLET | Freq: Every day | ORAL | 1 refills | Status: DC

## 2017-12-23 NOTE — Telephone Encounter (Signed)
*  STAT* If patient is at the pharmacy, call can be transferred to refill team.   1. Which medications need to be refilled? (please list name of each medication and dose if known) rivaroxaban (XARELTO) 20 MG TABS tablet   2. Which pharmacy/location (including street and city if local pharmacy) is medication to be sent to? [place]  3. Do they need a 30 day or 90 day supply? 90  Patient stated that a refill was called into [facility] on [address] and he does not get his medicine there. Please send to [facility]

## 2017-12-23 NOTE — Telephone Encounter (Signed)
New message       *STAT* If patient is at the pharmacy, call can be transferred to refill team.   1. Which medications need to be refilled? (please list name of each medication and dose if known)XARELTO 20 MG TABS tablet  2. Which pharmacy/location (including street and city if local pharmacy) is medication to be sent to?[id number] - [place], [place] - [address] AT [address]  3. Do they need a 30 day or 90 day supply? [place]

## 2017-12-24 MED ORDER — RIVAROXABAN 20 MG PO TABS: 20.0000 mg | ORAL_TABLET | Freq: Every day | ORAL | 1 refills | Status: DC

## 2017-12-29 ENCOUNTER — Other Ambulatory Visit: Payer: Self-pay | Admitting: Internal Medicine

## 2018-01-08 ENCOUNTER — Other Ambulatory Visit: Payer: Self-pay | Admitting: Internal Medicine

## 2018-01-10 ENCOUNTER — Other Ambulatory Visit: Payer: Self-pay | Admitting: Internal Medicine

## 2018-01-15 ENCOUNTER — Ambulatory Visit (INDEPENDENT_AMBULATORY_CARE_PROVIDER_SITE_OTHER): Payer: Medicare Other | Admitting: *Deleted

## 2018-01-15 DIAGNOSIS — I495 Sick sinus syndrome: Secondary | ICD-10-CM | POA: Diagnosis not present

## 2018-01-15 NOTE — Progress Notes (Signed)
Remote pacemaker transmission.   

## 2018-01-17 ENCOUNTER — Other Ambulatory Visit: Payer: Self-pay | Admitting: Internal Medicine

## 2018-01-20 ENCOUNTER — Ambulatory Visit: Payer: Medicare Other | Admitting: Internal Medicine

## 2018-01-23 ENCOUNTER — Other Ambulatory Visit: Payer: Self-pay | Admitting: Internal Medicine

## 2018-01-27 ENCOUNTER — Ambulatory Visit: Payer: Medicare Other | Admitting: Internal Medicine

## 2018-01-30 ENCOUNTER — Other Ambulatory Visit: Payer: Self-pay | Admitting: Internal Medicine

## 2018-02-02 ENCOUNTER — Other Ambulatory Visit: Payer: Self-pay

## 2018-02-02 ENCOUNTER — Other Ambulatory Visit: Payer: Self-pay | Admitting: Internal Medicine

## 2018-02-06 ENCOUNTER — Ambulatory Visit (INDEPENDENT_AMBULATORY_CARE_PROVIDER_SITE_OTHER): Payer: Medicare Other | Admitting: Internal Medicine

## 2018-02-06 ENCOUNTER — Encounter: Payer: Self-pay | Admitting: Internal Medicine

## 2018-02-06 VITALS — BP 126/64 | HR 79 | Temp 97.9°F | Resp 16 | Ht 67.0 in | Wt 168.0 lb

## 2018-02-06 DIAGNOSIS — E785 Hyperlipidemia, unspecified: Secondary | ICD-10-CM

## 2018-02-06 DIAGNOSIS — E1169 Type 2 diabetes mellitus with other specified complication: Secondary | ICD-10-CM | POA: Diagnosis not present

## 2018-02-06 DIAGNOSIS — R634 Abnormal weight loss: Secondary | ICD-10-CM

## 2018-02-06 DIAGNOSIS — Z0000 Encounter for general adult medical examination without abnormal findings: Secondary | ICD-10-CM | POA: Insufficient documentation

## 2018-02-06 DIAGNOSIS — E039 Hypothyroidism, unspecified: Secondary | ICD-10-CM

## 2018-02-06 DIAGNOSIS — F419 Anxiety disorder, unspecified: Secondary | ICD-10-CM

## 2018-02-06 DIAGNOSIS — Z23 Encounter for immunization: Secondary | ICD-10-CM

## 2018-02-06 DIAGNOSIS — I480 Paroxysmal atrial fibrillation: Secondary | ICD-10-CM | POA: Diagnosis not present

## 2018-02-06 DIAGNOSIS — F32A Depression, unspecified: Secondary | ICD-10-CM

## 2018-02-06 DIAGNOSIS — F329 Major depressive disorder, single episode, unspecified: Secondary | ICD-10-CM

## 2018-02-06 MED ORDER — SERTRALINE HCL 50 MG PO TABS: 50.0000 mg | ORAL_TABLET | Freq: Every day | ORAL | 6 refills | Status: DC

## 2018-02-06 MED ORDER — CANAGLIFLOZIN 300 MG PO TABS: 300.0000 mg | ORAL_TABLET | Freq: Every day | ORAL | 1 refills | Status: DC

## 2018-02-06 MED ORDER — TETANUS-DIPHTH-ACELL PERTUSSIS 5-2.5-18.5 LF-MCG/0.5 IM SUSP: 0.5000 mL | Freq: Once | INTRAMUSCULAR | 0 refills | Status: AC

## 2018-02-06 MED ORDER — GLUCOSE BLOOD VI STRP: ORAL_STRIP | 12 refills | Status: DC

## 2018-02-06 MED ORDER — ONETOUCH ULTRASOFT LANCETS MISC: 12 refills | Status: DC

## 2018-02-06 MED ORDER — SIMVASTATIN 20 MG PO TABS: 20.0000 mg | ORAL_TABLET | Freq: Every day | ORAL | 1 refills | Status: DC

## 2018-02-06 NOTE — Progress Notes (Signed)
Subjective:    Patient ID: Stephen Robbins, male    DOB: August 27, 1935, 82 y.o.   MRN: [id number]  DOS:  02/06/2018 Type of visit - description : rov Interval history: DM: needs new rx for glucometer strips Anxiety: His wife has dementia, had a ankle fracture, this is several weeks ago, had surgery, she is not getting better, still at rehab facility.  Uncertain about her future. Polypharmacy: pt is concerned about the amount of medications that he takes, would like me to review all meds.  Wt Readings from Last 3 Encounters:  02/06/18 168 lb (76.2 kg)  11/18/17 174 lb (78.9 kg)  11/06/17 173 lb (78.5 kg)     Review of Systems Weight loss noted, reports that although he is eating regularly she is appetite is not the same, states she is worried about his wife's health. He specifically denies fever, chills.  No headaches No abdominal pain, nausea or vomiting. Occasionally has diarrhea and constipation, was told that was probably related to Parkinson.   Past Medical History:  Diagnosis Date  . Arthritis    "joints; a little" (01/04/2014)  . Asymptomatic bilateral carotid artery stenosis    per duplex  05-15-2012  left >39%/   right 40-59%  . Basal cell carcinoma    nose  . Borderline diabetes   . BPH (benign prostatic hypertrophy) with urinary obstruction   . Bradycardia   . Bronchial pneumonia 1958  . Chronotropic incompetence with sinus node dysfunction (HCC)   . Compressed cervical disc   . Compression of lumbar vertebra (HCC)    L4 -- L5  . Dizzy   . Dysmetabolic syndrome   . Fatigue   . Frequency of urination   . GERD (gastroesophageal reflux disease)    occasional  . Hemorrhoids   . Hyperlipidemia   . Hypothyroidism   . Nocturia   . NSVT (nonsustained ventricular tachycardia) [facility])    cardiologist-  dr croitoru  . Pacemaker   . Urgency of urination   . Wears glasses     Past Surgical History:  Procedure Laterality Date  . BASAL CELL CARCINOMA EXCISION     nose  . CLOSED REDUCTION NASAL FRACTURE  09-01-2007  . CYSTOSCOPY N/A 12/28/2012   Procedure: CYSTOSCOPY;  Surgeon: Bernestine Amass, MD;  Location: [facility];  Service: Urology;  Laterality: N/A;  . EXERCISE TOLERENCE TEST  12-03-2012  DR CROITURO   CHRONOTROPIC INCOMPETENCE/ NORMAL RESTING BP W/ APPROPRIATE RESPONSE/ NO CHEST PAIN/ NO ST CHANGES FROM BASELINE  . INSERT / REPLACE / REMOVE PACEMAKER  01/04/2014   [employer] model L121 serial number [id number]  . LACERATION REPAIR Right 1978   middle finger  . NEUROPLASTY / TRANSPOSITION ULNAR NERVE AT ELBOW Left 02-09-2010  . PERMANENT PACEMAKER INSERTION N/A 01/04/2014   Procedure: PERMANENT PACEMAKER INSERTION;  Surgeon: Sanda Klein, MD;  Location: [place] [facility];  Service: Cardiovascular;  Laterality: N/A;  . TONSILLECTOMY AND ADENOIDECTOMY  1944  . TRANSURETHRAL INCISION OF PROSTATE N/A 12/28/2012   Procedure: TRANSURETHRAL INCISION OF THE PROSTATE (TUIP);  Surgeon: Bernestine Amass, MD;  Location: [facility];  Service: Urology;  Laterality: N/A;  . ULNAR NERVE TRANSPOSITION      Social History   Socioeconomic History  . Marital status: Married    Spouse name: Not on file  . Number of children: 0  . Years of education: Not on file  . Highest education level: Not on file  Occupational History  .  Occupation: retired    Fish farm manager: RETIRED    Comment: executive   Social Needs  . Financial resource strain: Not on file  . Food insecurity:    Worry: Not on file    Inability: Not on file  . Transportation needs:    Medical: Not on file    Non-medical: Not on file  Tobacco Use  . Smoking status: Former Smoker    Packs/day: 1.00    Years: 10.00    Pack years: 10.00    Types: Cigarettes    Last attempt to quit: 03/25/1968    Years since quitting: 49.9  . Smokeless tobacco: Never Used  Substance and Sexual Activity  . Alcohol use: Yes    Alcohol/week: 7.0 standard drinks    Types: 7  Glasses of wine per week    Comment: 1 glass wine daily  . Drug use: No  . Sexual activity: Not Currently  Lifestyle  . Physical activity:    Days per week: Not on file    Minutes per session: Not on file  . Stress: Not on file  Relationships  . Social connections:    Talks on phone: Not on file    Gets together: Not on file    Attends religious service: Not on file    Active member of club or organization: Not on file    Attends meetings of clubs or organizations: Not on file    Relationship status: Not on file  . Intimate partner violence:    Fear of current or ex partner: Not on file    Emotionally abused: Not on file    Physically abused: Not on file    Forced sexual activity: Not on file  Other Topics Concern  . Not on file  Social History Narrative   Lives at [place] as of 02/06/2018      Reactions   Penicillins Other (See Comments)   Unknown childhood reaction      Medication List        Accurate as of 02/06/18 11:59 PM. Always use your most recent med list.          bisacodyl 5 MG EC tablet Commonly known as:  DULCOLAX Take 5 mg daily as needed by mouth for moderate constipation.   canagliflozin 300 MG Tabs tablet Commonly known as:  INVOKANA Take 1 tablet (300 mg total) by mouth daily before breakfast.   carbidopa-levodopa 25-100 MG tablet Commonly known as:  SINEMET IR Take 2 tablets by mouth 3 (three) times daily.   gabapentin 300 MG capsule Commonly known as:  NEURONTIN TAKE 1 CAPSULE(300 MG) BY MOUTH TWICE DAILY   glucose blood test strip Check blood sugar three times daily   hydrocortisone 2.5 % rectal cream Commonly known as:  ANUSOL-HC Place 1 application rectally 2 (two) times daily.   Hydrocortisone Ace-Pramoxine 2.5-1 % Crea Apply rectally twice daily as needed   hydrocortisone cream 0.5 % Apply 1 application 2 (two) times daily as needed topically. rash   ketoconazole 2 % cream Commonly known as:   NIZORAL Apply 1 application topically daily.   levothyroxine 75 MCG tablet Commonly known as:  SYNTHROID, LEVOTHROID Take 1 tablet (75 mcg total) by mouth daily before breakfast.   metFORMIN 500 MG 24 hr tablet Commonly known as:  GLUCOPHAGE-XR Take 2 tablets (1,000 mg total) by mouth daily with breakfast.   MYRBETRIQ 25 MG Tb24 tablet Generic drug:  mirabegron ER Take 25 mg by  mouth daily.   onetouch ultrasoft lancets Check blood sugars three times daily   polyethylene glycol powder powder Commonly known as:  GLYCOLAX/MIRALAX Take 17 g daily by mouth.   rivaroxaban 20 MG Tabs tablet Commonly known as:  XARELTO Take 1 tablet (20 mg total) by mouth daily with supper.   sertraline 50 MG tablet Commonly known as:  ZOLOFT Take 1 tablet (50 mg total) by mouth daily.   simvastatin 20 MG tablet Commonly known as:  ZOCOR Take 1 tablet (20 mg total) by mouth at bedtime.   SYSTANE ULTRA 0.4-0.3 % Soln Generic drug:  Polyethyl Glycol-Propyl Glycol Place 1 drop into both eyes daily as needed (dry eyes).   Tdap 5-2.5-18.5 LF-MCG/0.5 injection Commonly known as:  BOOSTRIX Inject 0.5 mLs into the muscle once for 1 dose.   vitamin B-12 1000 MCG tablet Commonly known as:  CYANOCOBALAMIN Take 1,000 mcg by mouth every morning.          Objective:   Physical Exam BP 126/64 (BP Location: Left Arm, Patient Position: Sitting, Cuff Size: Small)   Pulse 79   Temp 97.9 F (36.6 C) (Oral)   Resp 16   Ht 5\' 7"  (1.702 m)   Wt 168 lb (76.2 kg)   SpO2 97%   BMI 26.31 kg/m   General:   Well developed, NAD, BMI noted. HEENT:  Normocephalic . Face symmetric, atraumatic Lungs:  CTA B Normal respiratory effort, no intercostal retractions, no accessory muscle use. Heart: RRR,  no murmur.  No pretibial edema bilaterally  Skin: Not pale. Not jaundice Neurologic:  alert & oriented X3.  Speech normal, gait appropriate for age and unassisted Psych--  Cognition and judgment appear  intact.  Cooperative with normal attention span and concentration.  Behavior appropriate. No anxious or depressed appearing.      Assessment & Plan:    Assessment. New patient 11/18/17, transferring from Dr. Raliegh Ip Diabetes Hypothyroidism Depression Hyperlipidemia CV: Paroxysmal A. fib, sick sinus syndrome,  pacemaker, NSVT, orthostatic hypotension, on Xarelto Hypogonadism hypogonadism NEURO: Dr [name] --Parkinson disease --Peripheral neuropathy Hemorrhoids  Urology Dr Lovena Neighbours : urine incontinence  Caregiver fatigue  PLAN DM: Currently on metformin, Invokana.  Last BMP satisfactory, check A1c Hypothyroidism: On Synthroid, check a TSH Depression/anxiety: Wife has dementia, had a ankle  fracture, still in a rehab facility, not "bouncing back".  He is very concerned, we agreed to increase sertraline to 50 mg. Weight loss: Checking general labs, although patient is eating , states his appetite is not the same, worried about his wife.  Recheck on RTC, see AVS Hyperlipidemia: On simvastatin, check a FLP, AST, ALT Atrial fibrillation: Anticoagulated, check a CBC. Polypharmacy: Patient is concerned about the amount of medicines he takes, he is taking all the appropriate medications.  If anything I recommended to stop the omega-3 fatty acids. Preventive care reviewed.  See separate documentation.  He is not very interested on screenings at this time RTC 3 months

## 2018-02-06 NOTE — Progress Notes (Signed)
Pre visit review using our clinic review tool, if applicable. No additional management support is needed unless otherwise documented below in the visit note. 

## 2018-02-06 NOTE — Assessment & Plan Note (Signed)
Data reviewed: Td 2007, Rx printed ; PNM 23 2016; PNM 13 2015; Zostavax 2010 [facility]: Colonoscopy 02/2005 Prostate cancer screening: Last PSA 2012 normal @ [facility], sees urology

## 2018-02-06 NOTE — Patient Instructions (Addendum)
Please schedule Medicare Wellness with Glenard Haring.    GO TO THE LAB : Get the blood work     GO TO THE FRONT DESK Schedule your next appointment for a checkup in 3 months  Okay to stop the omega-3 fatty acids  If you continue to lose weight in the next 4 to 5 weeks, please let me know

## 2018-02-07 LAB — CBC WITH DIFFERENTIAL/PLATELET
Basophils Absolute: 50 cells/uL (ref 0–200)
Basophils Relative: 0.7 %
Eosinophils Absolute: 71 cells/uL (ref 15–500)
Eosinophils Relative: 1 %
HCT: 41.6 % (ref 38.5–50.0)
Hemoglobin: 14.8 g/dL (ref 13.2–17.1)
Lymphs Abs: 1633 cells/uL (ref 850–3900)
MCH: 34.4 pg — ABNORMAL HIGH (ref 27.0–33.0)
MCHC: 35.6 g/dL (ref 32.0–36.0)
MCV: 96.7 fL (ref 80.0–100.0)
MPV: 9.9 fL (ref 7.5–12.5)
Monocytes Relative: 10.7 %
Neutro Abs: 4587 cells/uL (ref 1500–7800)
Neutrophils Relative %: 64.6 %
Platelets: 197 10*3/uL (ref 140–400)
RBC: 4.3 10*6/uL (ref 4.20–5.80)
RDW: 12.8 % (ref 11.0–15.0)
Total Lymphocyte: 23 %
WBC mixed population: 760 cells/uL (ref 200–950)
WBC: 7.1 10*3/uL (ref 3.8–10.8)

## 2018-02-07 LAB — HEMOGLOBIN A1C
Hgb A1c MFr Bld: 6.8 % of total Hgb — ABNORMAL HIGH (ref <5.7)
Mean Plasma Glucose: 148 (calc)
eAG (mmol/L): 8.2 (calc)

## 2018-02-07 LAB — LIPID PANEL
Cholesterol: 102 mg/dL (ref <200)
HDL: 34 mg/dL — ABNORMAL LOW (ref >40)
LDL Cholesterol (Calc): 49 mg/dL (calc)
Non-HDL Cholesterol (Calc): 68 mg/dL (calc) (ref <130)
Total CHOL/HDL Ratio: 3 (calc) (ref <5.0)
Triglycerides: 109 mg/dL (ref <150)

## 2018-02-07 LAB — TSH: TSH: 1.3 mIU/L (ref 0.40–4.50)

## 2018-02-07 LAB — AST: AST: 22 U/L (ref 10–35)

## 2018-02-07 LAB — ALT: ALT: 7 U/L — ABNORMAL LOW (ref 9–46)

## 2018-02-08 DIAGNOSIS — Z09 Encounter for follow-up examination after completed treatment for conditions other than malignant neoplasm: Secondary | ICD-10-CM | POA: Insufficient documentation

## 2018-02-08 NOTE — Assessment & Plan Note (Signed)
DM: Currently on metformin, Invokana.  Last BMP satisfactory, check A1c Hypothyroidism: On Synthroid, check a TSH Depression/anxiety: Wife has dementia, had a ankle  fracture, still in a rehab facility, not "bouncing back".  He is very concerned, we agreed to increase sertraline to 50 mg. Weight loss: Checking general labs, although patient is eating , states his appetite is not the same, worried about his wife.  Recheck on RTC, see AVS Hyperlipidemia: On simvastatin, check a FLP, AST, ALT Atrial fibrillation: Anticoagulated, check a CBC. Polypharmacy: Patient is concerned about the amount of medicines he takes, he is taking all the appropriate medications.  If anything I recommended to stop the omega-3 fatty acids. Preventive care reviewed.  See separate documentation.  He is not very interested on screenings at this time RTC 3 months

## 2018-02-09 ENCOUNTER — Other Ambulatory Visit: Payer: Self-pay

## 2018-02-09 MED ORDER — GLUCOSE BLOOD VI STRP: ORAL_STRIP | 12 refills | Status: DC

## 2018-02-09 MED ORDER — ACCU-CHEK GUIDE ME W/DEVICE KIT: 1.0000 | PACK | 0 refills | Status: DC

## 2018-02-09 MED ORDER — ACCU-CHEK MULTICLIX LANCETS MISC: 12 refills | Status: DC

## 2018-02-09 NOTE — Progress Notes (Signed)
One Touch supplies not covered by insurance. Accu-chek device and supplies sent.

## 2018-02-12 ENCOUNTER — Other Ambulatory Visit: Payer: Self-pay

## 2018-02-12 MED ORDER — ACCU-CHEK FASTCLIX LANCETS MISC: 12 refills | Status: DC

## 2018-02-12 MED ORDER — ACCU-CHEK FASTCLIX LANCET KIT: PACK | 0 refills | Status: DC

## 2018-03-08 ENCOUNTER — Other Ambulatory Visit: Payer: Self-pay | Admitting: Internal Medicine

## 2018-03-22 LAB — CUP PACEART REMOTE DEVICE CHECK
Date Time Interrogation Session: 20191229184209
Implantable Lead Implant Date: 20151013
Implantable Lead Implant Date: 20151013
Implantable Lead Location: 753859
Implantable Lead Location: 753860
Implantable Lead Model: 4135
Implantable Lead Model: 4136
Implantable Lead Serial Number: 29480373
Implantable Lead Serial Number: 29608302
Implantable Pulse Generator Implant Date: 20151013
Pulse Gen Serial Number: 702951

## 2018-04-06 ENCOUNTER — Other Ambulatory Visit: Payer: Self-pay | Admitting: Internal Medicine

## 2018-04-07 NOTE — Progress Notes (Signed)
Stephen Robbins was seen today in f/u in the movement clinic.  He was dx with PD last visit, on 06/17/13.  He is on carbidopa/levodopa 25/100 tid.  He has no side effects with the carbidopa/levodopa, but states that "I just don't know what to expect."  He continues to have tremor, which can be bothersome for him.  He has had no falls.  No lightheadedness.  No near syncope.  No hallucinations.  He had a carotid ultrasound done since last visit.  There is 1-39% stenosis bilaterally.  He did have some lab work done since last visit.  His hemoglobin A1c has greatly improved.  It is 6.3 (was 7.9 and prior to that was 9.4). He just finished PT and is enrolled in the exercise class now.  He presents today with a long list of questions.  He again tape records our session.    11/19/13 update:  Pt is f/u regarding Parkinson's disease.  He is currently on carbidopa/levodopa 25/100, one tablet 3 times per day. He is accompanied by his wife, who supplements the history.   Patient denies any falls.  He denies hallucinations.  Denies nausea or vomiting.  No lightheadedness or near syncope.  He is exercising.  He still has tremor and isn't sure that the levodopa helps tremor.  He sent me several e-mails since last visit.  One of these was just a TV news link regarding [facility] high-resolution imaging and deep brain stimulation.  He subsequently sent another e-mail regarding focused ultrasound.  Asks about updates regarding these and has 2 pages of questions.  Asks about why we can't give him human growth hormone or steroids for PD.  Overall, patient is clinically doing well.   02/21/14 update:   Pt returns for f/u, accompanied by his wife who supplements the history.  The records that were made available to me were reviewed.  Pt has had PPM since last visit due to bradycardia.  He has been told not to exercise until 12/9.  He will start with PWR moves classes after the first of the year as well as circuit 2 classes.  He is still  on carbidopa/levodopa 25/100 at 8-9am/12-1pm/6-7 pm.  He is c/o paresthesias/"neuropathy" in the R foot.  He only notices it with driving.   He has no back pain.  He has no sensation of balance loss with closing eyes in the shower.  He thinks that exposure to levodopa causes the neuropathy and brings with him a small study (58 pts) that suggests that levodopa could be an etiology for the neuropathy.  Notices lack of smell.  No falls.  Rare lightheadedness. No hallucinations.    05/23/14 update:  Pt is f/u today, accompanied by his wife who supplements the history.  [facility] went to [facility] since our last visit and I reviewed those records.  He was told he had PN (known diagnosis) but no new meds were prescribed.  He asks about a "cure" for PN that a doctor in [place] was advertising.   He remains on carbidopa/levodopa 25/100 tid for the PD.  He is exercising faithfully.  He has trouble getting himself over 3 miles per hour when walking and he is concerned about that.  He asks again about taking steroids and human growth hormone.  He also asks about taking protein supplements.  He has d/c the glucosamine for joint pain/arthritis as he didn't think that it was helpful.    09/08/14 update:  The patient  is following up today.  He is accompanied by his wife who supplements the history.   Records were reviewed since last visit, from his primary care physician, podiatrist, as well as his cardiologist.  He remains on carbidopa/levodopa 25/100, one tablet 3 times per day.  He notices no wearing off.  He is doing PD circuit class.  He called his podiatrist since last visit and asked him about medications for neuropathy and he deferred to me.  We started gabapentin on May 30, 2014.  He is on 300 mg twice a day.  He states that his feet numbness is better.  He has continued to complain about fatigue.  His pacemaker was recently adjusted.  He is trying to walk/exercise at the park and his endurance has been better ever  since.  He again asks about energy.  He is back on testosterone for that.    01/12/15 update:  The patient returns today for follow-up, accompanied by his wife who supplements the history.  He remains on carbidopa/levodopa 25/100, one tablet 3 times per day.  Overall, the patient states that he is doing well.   Still has some intermittent tremor.  He denies any syncope.  He has had no falls since last visit.  He has had some near falls.  No hallucinations or visual distortions.  He is on gabapentin, 300 mg twice a day for peripheral neuropathy secondary to diabetes mellitus. Pt states that it is helping with the sx's, along with the "natural herb that I take."   He continues to faithfully exercise with the PWR classes.  Some lightheaded after exercise.  No new medical issues since last visit.    04/18/15 update:  The patient presents today, accompanied by his wife who supplements the history.  Last visit, increased the patient's carbidopa/levodopa 25/100, so that he is now taking 1-1/2 tablets 3 times per day.   When asked how he feel, he states, "I don't know how I am supposed to feel."  He denies stiffness.  He has minimal thumb tremor.   He is supposed to be on gabapentin, 300 mg twice a day for peripheral neuropathy but went down to q day because his feet weren't bothering him.  I have reviewed records made available to me since last visit.  He was diagnosed with atrial fibrillation and started on Xarelto.  He is doing well with that.  He denies any bleeding difficulties.  No falls.  No near syncope.  Rarely does he even feel unstable.  He is exercising.  Goes to the gym three times a week.    08/22/15 update:  The patient presents today, accompanied by his wife who supplements the history. He is on carbidopa/levodopa 25/100, 1-1/2 tablets 3 times per day.  He remains on gabapentin, 300 mg once a day for peripheral neuropathy (7-9pm). He states that he doesn't think that he can come off of it, at least until  he moves.  He did come to the PD support group lecture that I gave 2 weeks ago.  He denies hallucinations, lightheadedness, near syncope.  He does state that they are moving to [place] in assisted living.  States that there has been some stress with moving, as he is having to fix a hole that was discovered in the bathroom floor.   Asks me about alpha-lipoic acid and states that the people who sell it to him "think that it is good."    12/26/15 update:  The patient follows up today, accompanied  by his wife who supplements the history.  He remains on carbidopa/levodopa 25/100, 1-1/2 tablets 3 times per day.  He is still on gabapentin, 300 mg at night for neuropathy.  States that he doesn't want to try and d/c that.    He has since moved to [place] and they are adjusting well.  They have sold their house.  He is doing a [employer] class.  No falls.  Some near falls.    04/29/16 update:  Patient follows up today, accompanied by his wife who supplements the history.  Patient is on carbidopa/levodopa 25/100, 1.5 tablets 3 times per day (8am/noon/5pm).  Pt denies falls.  Pt denies lightheadedness, near syncope.  No hallucinations.  Mood has been "up and down."  Occasional bouts of constipation vs diarrhea.   On gabapentin 300 q hs for PN.  Did OT/ST since our last visit and reviewed those notes.  Doing PWR moves classes on wednesdays.    09/03/16 update:  Patient seen today in follow-up, accompanied by his wife who supplements the history.  Patient remains on carbidopa/levodopa 25/100 25/100, 1-1/2 tablets at 8 AM/noon/5 PM.  He has had no falls.  No lightheadedness or near syncope.  No hallucinations.  Remains on gabapentin, but now on 300 mg bid.  Seems to be doing better with the neuropathic sx's now.  The records that were made available to me were reviewed.  Metformin started by pcp as HgBA1C was rising.  Has some constipation.  He pulled me to the side and states that he is slightly more depressed dealing with  his wifes alzheimers.  No SI/HI.    02/07/17 update: Patient is seen today in follow-up.  He is accompanied by his wife who supplements the history.  Patient is on carbidopa/levodopa 25/100, 1-1/2 tablets 3 times per day.  Patient has had no falls since last visit.  No hallucinations.  He is on gabapentin, 300 mg twice per day for paresthesias.  This seems to be helping and is well controlled and he doesn't even always take the evening gabapentin dosage.  He has had no lightheadedness or near syncope.  He continues to have some stress associated with caregiving for his wife who has dementia.  Records have been reviewed since last visit.  He was in the emergency room twice since last visit, once for constipation and once for low back pain.  When asked how much water he was drinking he stated "obviously not enough."  He has not tried the rancho recipe and doesn't remember that I gave it to him.  Asks for another copy.  He had labwork few days ago and reports that his thyroid medication was adjusted to account for high TSH.   06/17/17 update: Patient is seen today in follow-up.  The patient is accompanied by his wife who supplements the history.  The patient is doing well on carbidopa/levodopa 25/100, 1.5 tablets 3 times per day.  No falls, no hallucinations.  No lightheadedness or near syncope.  Records have been reviewed since our last visit.  He went to the [facility] support group but is somewhat frustrated at the time of it.  He is following rancho recipe and uses some miralax.  States that he drinks about 30 oz per day of water.  Still with some constipation.  Asks about special diets for PD.    11/06/17 update: Patient is seen today in follow-up for Parkinson's disease.  He is accompanied by his wife who supplements the history.  Patient  is on carbidopa/levodopa 25/100, 1.5 tablets 3 times per day.  Recommended speech therapy last visit, but he wanted to go in [facility] and if there were no [facility] loud  certified therapist (which he had desired).  He has had no falls since last visit.  Uses cane prn (after first awakens).  No lightheadedness or near syncope except a little lightheaded when he finishes exercise.  Does PWR Moves class in [place].  No hallucinations.  I have reviewed records since our last visit.  He saw cardiology on October 07, 2017.  No changes were made to medications.  He is transferring care to Dr. [name] as that is a closer location for him.  He has an appointment on August 27 with him.  Last dermatology visit: late spring, 2019.  C/o constipation.    04/09/18 update: Patient is seen today in follow-up for Parkinson's disease. Patient was on carbidopa/levodopa 25/100, which was slightly increased last visit to 2 tablets 3 times per day.  Doing well with this.  No hallucinations.  No lightheadedness except in the early AM (and he uses a cane then) or or near syncope.  No falls but had some near falls.  No sleep attacks.  Records are reviewed since last visit.  Last saw primary care on February 06, 2018.  wife fx foot in September and that has been the biggest stress as she had to be in the hospital for a week.  That made her dementia worse and she still isn't able to walk well. She is now in the long term care aspect which has been hard on him.  His mood has "not been well."  "I'm not contemplating destruction and I'm not drinking."  Is on sertraline, 50 mg daily.  States that it was just increased about 4 weeks ago and he doesn't want to increase further right now.  He thinks that the increase helped.  Speech has been softer but hasn't had time to do ST.  Not able to exercise right now.    No neuroimaging since CT brain in 2009.  PREVIOUS MEDICATIONS: none to date  ALLERGIES:   Allergies  Allergen Reactions  . Penicillins Other (See Comments)    Unknown childhood reaction    CURRENT MEDICATIONS:  Current Outpatient Medications on File Prior to Visit  Medication Sig Dispense Refill    . ACCU-CHEK FASTCLIX LANCETS MISC Check blood sugar three times daily 300 each 12  . bisacodyl (DULCOLAX) 5 MG EC tablet Take 5 mg daily as needed by mouth for moderate constipation.    . Blood Glucose Monitoring Suppl (ACCU-CHEK GUIDE ME) w/Device KIT 1 Device by Does not apply route as directed. 1 kit 0  . canagliflozin (INVOKANA) 300 MG TABS tablet Take 1 tablet (300 mg total) by mouth daily before breakfast. 90 tablet 1  . carbidopa-levodopa (SINEMET IR) 25-100 MG tablet Take 2 tablets by mouth 3 (three) times daily. 540 tablet 1  . gabapentin (NEURONTIN) 300 MG capsule TAKE 1 CAPSULE(300 MG) BY MOUTH TWICE DAILY 180 capsule 1  . glucose blood (ACCU-CHEK GUIDE) test strip Check blood sugar three times daily 300 each 12  . hydrocortisone (PROCTOZONE-HC) 2.5 % rectal cream Place 1 application rectally 2 (two) times daily. 30 g 5  . hydrocortisone cream 0.5 % Apply 1 application 2 (two) times daily as needed topically. rash 30 g 3  . ketoconazole (NIZORAL) 2 % cream Apply 1 application topically daily. 30 g 0  . Lancets Misc. (ACCU-CHEK FASTCLIX LANCET)  KIT Check blood sugar 3 times daily 1 kit 0  . levothyroxine (SYNTHROID, LEVOTHROID) 75 MCG tablet Take 1 tablet (75 mcg total) by mouth daily before breakfast. 90 tablet 1  . metFORMIN (GLUCOPHAGE-XR) 500 MG 24 hr tablet Take 2 tablets (1,000 mg total) by mouth daily with breakfast. 180 tablet 1  . mirabegron ER (MYRBETRIQ) 25 MG TB24 tablet Take 25 mg by mouth daily.    Vladimir Faster Glycol-Propyl Glycol (SYSTANE ULTRA) 0.4-0.3 % SOLN Place 1 drop into both eyes daily as needed (dry eyes).     . polyethylene glycol powder (MIRALAX) powder Take 17 g daily by mouth. 255 g 0  . Pramoxine-HC (HYDROCORTISONE ACE-PRAMOXINE) 2.5-1 % CREA Apply rectally twice daily as needed 28.35 g 0  . rivaroxaban (XARELTO) 20 MG TABS tablet Take 1 tablet (20 mg total) by mouth daily with supper. 90 tablet 1  . sertraline (ZOLOFT) 50 MG tablet Take 1 tablet (50 mg  total) by mouth daily. 30 tablet 6  . simvastatin (ZOCOR) 20 MG tablet Take 1 tablet (20 mg total) by mouth at bedtime. 90 tablet 1  . vitamin B-12 (CYANOCOBALAMIN) 1000 MCG tablet Take 1,000 mcg by mouth every morning.      No current facility-administered medications on file prior to visit.     PAST MEDICAL HISTORY:   Past Medical History:  Diagnosis Date  . Arthritis    "joints; a little" (01/04/2014)  . Asymptomatic bilateral carotid artery stenosis    per duplex  05-15-2012  left >39%/   right 40-59%  . Basal cell carcinoma    nose  . Borderline diabetes   . BPH (benign prostatic hypertrophy) with urinary obstruction   . Bradycardia   . Bronchial pneumonia 1958  . Chronotropic incompetence with sinus node dysfunction (HCC)   . Compressed cervical disc   . Compression of lumbar vertebra (HCC)    L4 -- L5  . Dizzy   . Dysmetabolic syndrome   . Fatigue   . Frequency of urination   . GERD (gastroesophageal reflux disease)    occasional  . Hemorrhoids   . Hyperlipidemia   . Hypothyroidism   . Nocturia   . NSVT (nonsustained ventricular tachycardia) [facility])    cardiologist-  dr croitoru  . Pacemaker   . Urgency of urination   . Wears glasses     PAST SURGICAL HISTORY:   Past Surgical History:  Procedure Laterality Date  . BASAL CELL CARCINOMA EXCISION     nose  . CLOSED REDUCTION NASAL FRACTURE  09-01-2007  . CYSTOSCOPY N/A 12/28/2012   Procedure: CYSTOSCOPY;  Surgeon: Bernestine Amass, MD;  [facility];  Service: Urology;  Laterality: N/A;  . EXERCISE TOLERENCE TEST  12-03-2012  DR CROITURO   CHRONOTROPIC INCOMPETENCE/ NORMAL RESTING BP W/ APPROPRIATE RESPONSE/ NO CHEST PAIN/ NO ST CHANGES FROM BASELINE  . INSERT / REPLACE / REMOVE PACEMAKER  01/04/2014   [employer] model L121 serial number [id number]  . LACERATION REPAIR Right 1978   middle finger  . NEUROPLASTY / TRANSPOSITION ULNAR NERVE AT ELBOW Left 02-09-2010  . PERMANENT  PACEMAKER INSERTION N/A 01/04/2014   Procedure: PERMANENT PACEMAKER INSERTION;  Surgeon: Sanda Klein, MD;  Location: [facility];  Service: Cardiovascular;  Laterality: N/A;  . TONSILLECTOMY AND ADENOIDECTOMY  1944  . TRANSURETHRAL INCISION OF PROSTATE N/A 12/28/2012   Procedure: TRANSURETHRAL INCISION OF THE PROSTATE (TUIP);  Surgeon: Bernestine Amass, MD;  Location[facility];  Service:  Urology;  Laterality: N/A;  . ULNAR NERVE TRANSPOSITION      SOCIAL HISTORY:   Social History   Socioeconomic History  . Marital status: Married    Spouse name: Not on file  . Number of children: 0  . Years of education: Not on file  . Highest education level: Not on file  Occupational History  . Occupation: retired    Fish farm manager: RETIRED    Comment: executive   Social Needs  . Financial resource strain: Not on file  . Food insecurity:    Worry: Not on file    Inability: Not on file  . Transportation needs:    Medical: Not on file    Non-medical: Not on file  Tobacco Use  . Smoking status: Former Smoker    Packs/day: 1.00    Years: 10.00    Pack years: 10.00    Types: Cigarettes    Last attempt to quit: 03/25/1968    Years since quitting: 50.0  . Smokeless tobacco: Never Used  Substance and Sexual Activity  . Alcohol use: Yes    Alcohol/week: 7.0 standard drinks    Types: 7 Glasses of wine per week    Comment: 1 glass wine daily  . Drug use: No  . Sexual activity: Not Currently  Lifestyle  . Physical activity:    Days per week: Not on file    Minutes per session: Not on file  . Stress: Not on file  Relationships  . Social connections:    Talks on phone: Not on file    Gets together: Not on file    Attends religious service: Not on file    Active member of club or organization: Not on file    Attends meetings of clubs or organizations: Not on file    Relationship status: Not on file  . Intimate partner violence:    Fear of current or ex partner: Not on file     Emotionally abused: Not on file    Physically abused: Not on file    Forced sexual activity: Not on file  Other Topics Concern  . Not on file  Social History Narrative   Lives at [place]:   Family Status  Relation Name Status  . Father  Deceased       stroke  . Mother  Deceased       lung cancer  . Brother  Alive       healthy    ROS:    Review of Systems  Constitutional: Positive for malaise/fatigue.  HENT: Negative.   Eyes: Negative.   Respiratory: Negative.   Cardiovascular: Negative.   Gastrointestinal: Negative.   Skin: Negative.   Psychiatric/Behavioral: Positive for depression.   ]  PHYSICAL EXAMINATION:    VITALS:   Vitals:   04/09/18 1312  BP: 110/66  Pulse: 74  SpO2: 95%  Weight: 169 lb (76.7 kg)  Height: _0  (1.753 m)   Wt Readings from Last 3 Encounters:  04/09/18 169 lb (76.7 kg)  02/06/18 168 lb (76.2 kg)  11/18/17 174 lb (78.9 kg)    GEN:  The patient appears stated age and is in NAD. HEENT:  Normocephalic, atraumatic.  The mucous membranes are moist. The superficial temporal arteries are without ropiness or tenderness. CV:  RRR Lungs:  CTAB Neck/HEME:  There are no carotid bruits bilaterally.  Neurological examination:  Orientation: The patient is alert and oriented x3. Cranial nerves: There is good facial symmetry.  The speech is fluent and clear. Soft palate rises symmetrically and there is no tongue deviation. Hearing is intact to conversational tone. Sensation: Sensation is intact to light touch throughout Motor: Strength is 5/5 in the bilateral upper and lower extremities.   Shoulder shrug is equal and symmetric.  There is no pronator drift.   Movement examination: Tone: There is minor increased tone in the LUE Abnormal movements: There is no tremor today Coordination:  There is no decremation, with any form of RAMS, including alternating supination and pronation of the forearm, hand opening and closing,  finger taps, heel taps and toe taps bilaterally Gait and Station: The patient has no difficulty arising out of a deep-seated chair without the use of the hands.  No start hesitation.  No stooped posture today.  Negative pull test.    LABS:  Lab Results  Component Value Date   TSH 1.30 02/06/2018   Lab Results  Component Value Date   VITAMINB12 872 06/17/2013     Chemistry      Component Value Date/Time   NA 137 11/18/2017 1211   K 3.9 11/18/2017 1211   CL 101 11/18/2017 1211   CO2 26 11/18/2017 1211   BUN 18 11/18/2017 1211   CREATININE 0.79 11/18/2017 1211   CREATININE 0.77 12/29/2013 1627      Component Value Date/Time   CALCIUM 10.1 11/18/2017 1211   ALKPHOS 39 02/04/2017 1207   AST 22 02/06/2018 1518   ALT 7 (L) 02/06/2018 1518   BILITOT 0.9 02/04/2017 1207     RPR negative Lab Results  Component Value Date   FOLATE >20.0 06/17/2013     Lab Results  Component Value Date   HGBA1C 6.8 (H) 02/06/2018    ASSESSMENT/PLAN:  1. idiopathic Parkinson's disease.  I see no atypical features.    -continue carbidopa/levodopa 25/100, 2 po tid.  Risks, benefits, side effects and alternative therapies were discussed.  The opportunity to ask questions was given and they were answered to the best of my ability.  The patient expressed understanding and willingness to follow the outlined treatment protocols.  -needs to r/s derm appt.    PD increases risk of melanoma.   2.  Peripheral neuropathy  -There is evidence of this on examination.  Likely from DM.  His workup for reversible causes was negative.  His diabetes is under much better control than in the past.  -currently on gabapentin - 1 -2 times per day.  He does wish to continue given paresthesias in the feet. 3.  Hx of transaminasemia  -These were elevated in 2012 but have improved 4.  Constipation  -doesn't want to add miralax/colace.   5.  Depression   -associated with wife/caregiving.    -he just had his sertraline  increased to 50 mg daily. He and I discussed that this can be increased further.   Risks, benefits, side effects and alternative therapies were discussed.  The opportunity to ask questions was given and they were answered to the best of my ability.  The patient expressed understanding and willingness to follow the outlined treatment protocols. 6.  Follow up is anticipated in the next few months, sooner should new neurologic issues arise.  Much greater than 50% of this visit was spent in counseling and coordinating care.  Total face to face time:  25 min

## 2018-04-09 ENCOUNTER — Encounter: Payer: Self-pay | Admitting: Neurology

## 2018-04-09 ENCOUNTER — Ambulatory Visit (INDEPENDENT_AMBULATORY_CARE_PROVIDER_SITE_OTHER): Payer: Medicare Other | Admitting: Neurology

## 2018-04-09 VITALS — BP 110/66 | HR 74 | Ht 69.0 in | Wt 169.0 lb

## 2018-04-09 DIAGNOSIS — F330 Major depressive disorder, recurrent, mild: Secondary | ICD-10-CM

## 2018-04-09 DIAGNOSIS — K5901 Slow transit constipation: Secondary | ICD-10-CM | POA: Diagnosis not present

## 2018-04-09 DIAGNOSIS — G20 Parkinson's disease: Secondary | ICD-10-CM

## 2018-04-09 NOTE — Patient Instructions (Signed)
Reschedule your appointment for your dermatologist and urologist  You look quite good!  You are doing a great job taking care of Stephen Robbins!  Now take care of yourself!

## 2018-04-13 ENCOUNTER — Other Ambulatory Visit: Payer: Self-pay | Admitting: Internal Medicine

## 2018-04-16 ENCOUNTER — Ambulatory Visit (INDEPENDENT_AMBULATORY_CARE_PROVIDER_SITE_OTHER): Payer: Medicare Other

## 2018-04-16 DIAGNOSIS — I495 Sick sinus syndrome: Secondary | ICD-10-CM | POA: Diagnosis not present

## 2018-04-16 LAB — CUP PACEART REMOTE DEVICE CHECK
Battery Remaining Longevity: 120 mo
Battery Remaining Percentage: 100 %
Brady Statistic RA Percent Paced: 33 %
Brady Statistic RV Percent Paced: 1 %
Date Time Interrogation Session: 20200123105700
Implantable Lead Implant Date: 20151013
Implantable Lead Implant Date: 20151013
Implantable Lead Location: 753859
Implantable Lead Location: 753860
Implantable Lead Model: 4135
Implantable Lead Model: 4136
Implantable Lead Serial Number: 29480373
Implantable Lead Serial Number: 29608302
Implantable Pulse Generator Implant Date: 20151013
Lead Channel Impedance Value: 540 Ohm
Lead Channel Impedance Value: 663 Ohm
Lead Channel Setting Pacing Amplitude: 2 V
Lead Channel Setting Pacing Amplitude: 4 V
Lead Channel Setting Pacing Pulse Width: 1 ms
Lead Channel Setting Sensing Sensitivity: 2.5 mV
Pulse Gen Serial Number: 702951

## 2018-04-17 ENCOUNTER — Encounter: Payer: Self-pay | Admitting: Cardiology

## 2018-04-17 NOTE — Progress Notes (Signed)
Remote pacemaker transmission.   

## 2018-04-28 ENCOUNTER — Encounter: Payer: Self-pay | Admitting: Internal Medicine

## 2018-04-28 DIAGNOSIS — D485 Neoplasm of uncertain behavior of skin: Secondary | ICD-10-CM | POA: Diagnosis not present

## 2018-04-28 DIAGNOSIS — C4442 Squamous cell carcinoma of skin of scalp and neck: Secondary | ICD-10-CM | POA: Diagnosis not present

## 2018-04-28 DIAGNOSIS — L218 Other seborrheic dermatitis: Secondary | ICD-10-CM | POA: Diagnosis not present

## 2018-04-28 DIAGNOSIS — Z85828 Personal history of other malignant neoplasm of skin: Secondary | ICD-10-CM | POA: Diagnosis not present

## 2018-05-04 ENCOUNTER — Telehealth: Payer: Self-pay | Admitting: *Deleted

## 2018-05-04 NOTE — Telephone Encounter (Signed)
Received Dermatopathology Report results from [facility]; forwarded to provider/SLS 02/10

## 2018-05-07 ENCOUNTER — Other Ambulatory Visit: Payer: Self-pay | Admitting: Neurology

## 2018-05-12 ENCOUNTER — Encounter: Payer: Self-pay | Admitting: Internal Medicine

## 2018-05-12 ENCOUNTER — Ambulatory Visit (INDEPENDENT_AMBULATORY_CARE_PROVIDER_SITE_OTHER): Payer: Medicare Other | Admitting: Internal Medicine

## 2018-05-12 VITALS — BP 126/62 | HR 72 | Temp 97.7°F | Resp 16 | Ht 69.0 in | Wt 171.0 lb

## 2018-05-12 DIAGNOSIS — E039 Hypothyroidism, unspecified: Secondary | ICD-10-CM | POA: Diagnosis not present

## 2018-05-12 DIAGNOSIS — G20A1 Parkinson's disease without dyskinesia, without mention of fluctuations: Secondary | ICD-10-CM

## 2018-05-12 DIAGNOSIS — F329 Major depressive disorder, single episode, unspecified: Secondary | ICD-10-CM

## 2018-05-12 DIAGNOSIS — E119 Type 2 diabetes mellitus without complications: Secondary | ICD-10-CM

## 2018-05-12 DIAGNOSIS — F32A Depression, unspecified: Secondary | ICD-10-CM

## 2018-05-12 DIAGNOSIS — G20 Parkinson's disease: Secondary | ICD-10-CM | POA: Diagnosis not present

## 2018-05-12 DIAGNOSIS — E1142 Type 2 diabetes mellitus with diabetic polyneuropathy: Secondary | ICD-10-CM | POA: Diagnosis not present

## 2018-05-12 DIAGNOSIS — F419 Anxiety disorder, unspecified: Secondary | ICD-10-CM

## 2018-05-12 DIAGNOSIS — E785 Hyperlipidemia, unspecified: Secondary | ICD-10-CM | POA: Diagnosis not present

## 2018-05-12 MED ORDER — TETANUS-DIPHTH-ACELL PERTUSSIS 5-2.5-18.5 LF-MCG/0.5 IM SUSP: 0.5000 mL | Freq: Once | INTRAMUSCULAR | 0 refills | Status: AC

## 2018-05-12 MED ORDER — ZOSTER VAC RECOMB ADJUVANTED 50 MCG/0.5ML IM SUSR: 0.5000 mL | Freq: Once | INTRAMUSCULAR | 1 refills | Status: AC

## 2018-05-12 MED ORDER — ACCU-CHEK FASTCLIX LANCET KIT: PACK | 3 refills | Status: DC

## 2018-05-12 NOTE — Progress Notes (Signed)
Subjective:    Patient ID: Stephen Robbins, male    DOB: [birth year], 83 y.o.   MRN: [id number]  DOS:  05/12/2018 Type of visit - description: Follow-up DM: Good compliance with medication, no ambulatory CBGs. Depression: Still feeling sad and overwhelmed about the health of his wife, his brother is also ill. Urinary incontinence: Still an issue, slightly worse?. Parkinson: Note from neurology reviewed  Review of Systems Denies fever chills No bowel incontinence No insomnia.  No suicidal ideas or anxiety. No dysuria or gross hematuria  Past Medical History:  Diagnosis Date  . Arthritis    "joints; a little" (01/04/2014)  . Asymptomatic bilateral carotid artery stenosis    per duplex  05-15-2012  left >39%/   right 40-59%  . Basal cell carcinoma    nose  . Borderline diabetes   . BPH (benign prostatic hypertrophy) with urinary obstruction   . Bradycardia   . Bronchial pneumonia 1958  . Chronotropic incompetence with sinus node dysfunction (HCC)   . Compressed cervical disc   . Compression of lumbar vertebra (HCC)    L4 -- L5  . Dizzy   . Dysmetabolic syndrome   . Fatigue   . Frequency of urination   . GERD (gastroesophageal reflux disease)    occasional  . Hemorrhoids   . Hyperlipidemia   . Hypothyroidism   . Nocturia   . NSVT (nonsustained ventricular tachycardia) [facility])    cardiologist-  dr croitoru  . Pacemaker   . Urgency of urination   . Wears glasses     Past Surgical History:  Procedure Laterality Date  . BASAL CELL CARCINOMA EXCISION     nose  . CLOSED REDUCTION NASAL FRACTURE  09-01-2007  . CYSTOSCOPY N/A 12/28/2012   Procedure: CYSTOSCOPY;  Surgeon: Bernestine Amass, MD;  Location: [facility];  Service: Urology;  Laterality: N/A;  . EXERCISE TOLERENCE TEST  12-03-2012  DR CROITURO   CHRONOTROPIC INCOMPETENCE/ NORMAL RESTING BP W/ APPROPRIATE RESPONSE/ NO CHEST PAIN/ NO ST CHANGES FROM BASELINE  . INSERT / REPLACE / REMOVE PACEMAKER   01/04/2014   [employer] model L121 serial number [id number]  . LACERATION REPAIR Right 1978   middle finger  . NEUROPLASTY / TRANSPOSITION ULNAR NERVE AT ELBOW Left 02-09-2010  . PERMANENT PACEMAKER INSERTION N/A 01/04/2014   Procedure: PERMANENT PACEMAKER INSERTION;  Surgeon: Sanda Klein, MD;  Location: [place] [facility];  Service: Cardiovascular;  Laterality: N/A;  . SKIN BIOPSY     scc  . TONSILLECTOMY AND ADENOIDECTOMY  1944  . TRANSURETHRAL INCISION OF PROSTATE N/A 12/28/2012   Procedure: TRANSURETHRAL INCISION OF THE PROSTATE (TUIP);  Surgeon: Bernestine Amass, MD;  Location: [facility];  Service: Urology;  Laterality: N/A;  . ULNAR NERVE TRANSPOSITION      Social History   Socioeconomic History  . Marital status: Married    Spouse name: Not on file  . Number of children: 0  . Years of education: Not on file  . Highest education level: Not on file  Occupational History  . Occupation: retired    Fish farm manager: RETIRED    Comment: executive   Social Needs  . Financial resource strain: Not on file  . Food insecurity:    Worry: Not on file    Inability: Not on file  . Transportation needs:    Medical: Not on file    Non-medical: Not on file  Tobacco Use  . Smoking status: Former Smoker  Packs/day: 1.00    Years: 10.00    Pack years: 10.00    Types: Cigarettes    Last attempt to quit: 03/25/1968    Years since quitting: 50.1  . Smokeless tobacco: Never Used  Substance and Sexual Activity  . Alcohol use: Yes    Alcohol/week: 7.0 standard drinks    Types: 7 Glasses of wine per week    Comment: 1 glass wine daily  . Drug use: No  . Sexual activity: Not Currently  Lifestyle  . Physical activity:    Days per week: Not on file    Minutes per session: Not on file  . Stress: Not on file  Relationships  . Social connections:    Talks on phone: Not on file    Gets together: Not on file    Attends religious service: Not on file    Active member of  club or organization: Not on file    Attends meetings of clubs or organizations: Not on file    Relationship status: Not on file  . Intimate partner violence:    Fear of current or ex partner: Not on file    Emotionally abused: Not on file    Physically abused: Not on file    Forced sexual activity: Not on file  Other Topics Concern  . Not on file  Social History Narrative   Lives at [place] as of 05/12/2018      Reactions   Penicillins Other (See Comments)   Unknown childhood reaction      Medication List       Accurate as of May 12, 2018  2:06 PM. Always use your most recent med list.        ACCU-CHEK FASTCLIX LANCET Kit Check blood sugar once daily   ACCU-CHEK FASTCLIX LANCETS Misc Check blood sugar three times daily   ACCU-CHEK GUIDE ME w/Device Kit 1 Device by Does not apply route as directed.   bisacodyl 5 MG EC tablet Commonly known as:  DULCOLAX Take 5 mg daily as needed by mouth for moderate constipation.   canagliflozin 300 MG Tabs tablet Commonly known as:  INVOKANA Take 1 tablet (300 mg total) by mouth daily before breakfast.   carbidopa-levodopa 25-100 MG tablet Commonly known as:  SINEMET IR TAKE 2 TABLETS BY MOUTH THREE TIMES DAILY   gabapentin 300 MG capsule Commonly known as:  NEURONTIN TAKE 1 CAPSULE(300 MG) BY MOUTH TWICE DAILY   glucose blood test strip Commonly known as:  ACCU-CHEK GUIDE Check blood sugar three times daily   hydrocortisone 2.5 % rectal cream Commonly known as:  PROCTOZONE-HC Place 1 application rectally 2 (two) times daily.   Hydrocortisone Ace-Pramoxine 2.5-1 % Crea Apply rectally twice daily as needed   hydrocortisone cream 0.5 % Apply 1 application 2 (two) times daily as needed topically. rash   ketoconazole 2 % cream Commonly known as:  NIZORAL Apply 1 application topically daily.   levothyroxine 75 MCG tablet Commonly known as:  SYNTHROID, LEVOTHROID Take 1 tablet (75 mcg total) by  mouth daily before breakfast.   metFORMIN 500 MG 24 hr tablet Commonly known as:  GLUCOPHAGE-XR Take 2 tablets (1,000 mg total) by mouth daily with breakfast.   MYRBETRIQ 25 MG Tb24 tablet Generic drug:  mirabegron ER Take 25 mg by mouth daily.   polyethylene glycol powder powder Commonly known as:  MIRALAX Take 17 g daily by mouth.   rivaroxaban 20 MG Tabs tablet Commonly known  as:  XARELTO Take 1 tablet (20 mg total) by mouth daily with supper.   sertraline 50 MG tablet Commonly known as:  ZOLOFT Take 1 tablet (50 mg total) by mouth daily.   simvastatin 20 MG tablet Commonly known as:  ZOCOR Take 1 tablet (20 mg total) by mouth at bedtime.   SYSTANE ULTRA 0.4-0.3 % Soln Generic drug:  Polyethyl Glycol-Propyl Glycol Place 1 drop into both eyes daily as needed (dry eyes).   vitamin B-12 1000 MCG tablet Commonly known as:  CYANOCOBALAMIN Take 1,000 mcg by mouth every morning.           Objective:   Physical Exam BP 126/62 (BP Location: Left Arm, Patient Position: Sitting, Cuff Size: Small)   Pulse 72   Temp 97.7 F (36.5 C) (Oral)   Resp 16   Ht _0  (1.753 m)   Wt 171 lb (77.6 kg)   SpO2 98%   BMI 25.25 kg/m  General:   Well developed, NAD, BMI noted.  HEENT:  Normocephalic . Face symmetric, atraumatic Lungs:  CTA B Normal respiratory effort, no intercostal retractions, no accessory muscle use. Heart: RRR,  no murmur.  no pretibial edema bilaterally  Abdomen:  Not distended, soft, non-tender. No rebound or rigidity.   Diabetic foot exam: No edema, good pedal pulses, pinprick examination with decreased sensitivity distally worse on the left. Neurologic:  alert & oriented X3.  Speech normal, gait appropriate for age and unassisted Psych--  Cognition and judgment appear intact.  Cooperative with normal attention span and concentration.  Behavior appropriate. No anxious or depressed appearing.     Assessment      Assessment. (New patient  11/18/17, transferring from Dr. Raliegh Ip) Diabetes DM Neuropathy Hypothyroidism Depression Hyperlipidemia CV: Paroxysmal A. fib, sick sinus syndrome,  pacemaker, NSVT, orthostatic hypotension, on Xarelto Hypogonadism hypogonadism NEURO: Dr [name] --Parkinson disease --Peripheral neuropathy Hemorrhoids  Urology Dr Lovena Neighbours : urine incontinence  Caregiver fatigue  PLAN DM: Good compliance with Invokana, metformin.  No recent CBGs but will start checking once or twice a day.  CBG goals discussed. Check A1c.   DM neuropathy: Exam is confirmatory feet care discussed Depression: Extensively counseled, sxs related to his wife health.  Sometimes he feels guilty about not being able to see her every day.  Currently on sertraline 50 mg daily.  Denies any need to increase sertraline. High cholesterol: Well-controlled, last FLP satisfactory Parkinson, saw neurology 04/09/2018, was referred to dermatology due to increased risk of melanoma with Parkinson.  He had a skin cancer removed from the scalp by Dr. Ronnald Ramp Social: Patient is currently living at the independent side of [facility], wife is at the skilled nurse facility side. Preventive care, encouraged to have Td and Shingrix RTC 4 months  Today, I spent more than  30  min with the patient: >50% of the time counseling regards his chronic medical problems, feet care and particularly about depression elated to the health of his wife and brother.

## 2018-05-12 NOTE — Progress Notes (Signed)
Pre visit review using our clinic review tool, if applicable. No additional management support is needed unless otherwise documented below in the visit note. 

## 2018-05-12 NOTE — Patient Instructions (Addendum)
Please schedule Medicare Wellness with Glenard Haring.   Per our records you are due for an eye exam. Please contact your eye doctor to schedule an appointment. Please have them send copies of your office visit notes to [place]. Our fax number is [phone].   GO TO THE LAB : Get the blood work     GO TO THE FRONT DESK Schedule your next appointment  For a check up in 4 months   Diabetes: Check your blood sugar  once or twice a day GOALS: Fasting before a meal 70- 130 2 hours after a meal less than 180    Diabetes Mellitus and [place] care is an important part of your health, especially when you have diabetes. Diabetes may cause you to have problems because of poor blood flow (circulation) to your feet and legs, which can cause your skin to:  Become thinner and drier.  Break more easily.  Heal more slowly.  Peel and crack. You may also have nerve damage (neuropathy) in your legs and feet, causing decreased feeling in them. This means that you may not notice minor injuries to your feet that could lead to more serious problems. Noticing and addressing any potential problems early is the best way to prevent future foot problems. How to care for your feet Foot hygiene  Wash your feet daily with warm water and mild soap. Do not use hot water. Then, pat your feet and the areas between your toes until they are completely dry. Do not soak your feet as this can dry your skin.  Trim your toenails straight across. Do not dig under them or around the cuticle. File the edges of your nails with an emery board or nail file.  Apply a moisturizing lotion or petroleum jelly to the skin on your feet and to dry, brittle toenails. Use lotion that does not contain alcohol and is unscented. Do not apply lotion between your toes. Shoes and socks  Wear clean socks or stockings every day. Make sure they are not too tight. Do not wear knee-high stockings since they may decrease blood flow to your  legs.  Wear shoes that fit properly and have enough cushioning. Always look in your shoes before you put them on to be sure there are no objects inside.  To break in new shoes, wear them for just a few hours a day. This prevents injuries on your feet. Wounds, scrapes, corns, and calluses  Check your feet daily for blisters, cuts, bruises, sores, and redness. If you cannot see the bottom of your feet, use a mirror or ask someone for help.  Do not cut corns or calluses or try to remove them with medicine.  If you find a minor scrape, cut, or break in the skin on your feet, keep it and the skin around it clean and dry. You may clean these areas with mild soap and water. Do not clean the area with peroxide, alcohol, or iodine.  If you have a wound, scrape, corn, or callus on your foot, look at it several times a day to make sure it is healing and not infected. Check for: ? Redness, swelling, or pain. ? Fluid or blood. ? Warmth. ? Pus or a bad smell. General instructions  Do not cross your legs. This may decrease blood flow to your feet.  Do not use heating pads or hot water bottles on your feet. They may burn your skin. If you have lost feeling in your feet  or legs, you may not know this is happening until it is too late.  Protect your feet from hot and cold by wearing shoes, such as at the beach or on hot pavement.  Schedule a complete foot exam at least once a year (annually) or more often if you have foot problems. If you have foot problems, report any cuts, sores, or bruises to your health care provider immediately. Contact a health care provider if:  You have a medical condition that increases your risk of infection and you have any cuts, sores, or bruises on your feet.  You have an injury that is not healing.  You have redness on your legs or feet.  You feel burning or tingling in your legs or feet.  You have pain or cramps in your legs and feet.  Your legs or feet are  numb.  Your feet always feel cold.  You have pain around a toenail. Get help right away if:  You have a wound, scrape, corn, or callus on your foot and: ? You have pain, swelling, or redness that gets worse. ? You have fluid or blood coming from the wound, scrape, corn, or callus. ? Your wound, scrape, corn, or callus feels warm to the touch. ? You have pus or a bad smell coming from the wound, scrape, corn, or callus. ? You have a fever. ? You have a red line going up your leg. Summary  Check your feet every day for cuts, sores, red spots, swelling, and blisters.  Moisturize feet and legs daily.  Wear shoes that fit properly and have enough cushioning.  If you have foot problems, report any cuts, sores, or bruises to your health care provider immediately.  Schedule a complete foot exam at least once a year (annually) or more often if you have foot problems. This information is not intended to replace advice given to you by your health care provider. Make sure you discuss any questions you have with your health care provider. Document Released: 03/08/2000 Document Revised: 04/23/2017 Document Reviewed: 04/12/2016 Elsevier Interactive Patient Education  2019 [employer].

## 2018-05-13 LAB — ALT: ALT: 9 U/L (ref 0–53)

## 2018-05-13 LAB — HEMOGLOBIN A1C: Hgb A1c MFr Bld: 8.2 % — ABNORMAL HIGH (ref 4.6–6.5)

## 2018-05-13 LAB — AST: AST: 21 U/L (ref 0–37)

## 2018-05-13 LAB — BASIC METABOLIC PANEL
BUN: 17 mg/dL (ref 6–23)
CO2: 27 mEq/L (ref 19–32)
Calcium: 9.5 mg/dL (ref 8.4–10.5)
Chloride: 101 mEq/L (ref 96–112)
Creatinine, Ser: 0.8 mg/dL (ref 0.40–1.50)
GFR: 92.82 mL/min (ref >60.00)
Glucose, Bld: 187 mg/dL — ABNORMAL HIGH (ref 70–99)
Potassium: 3.9 mEq/L (ref 3.5–5.1)
Sodium: 136 mEq/L (ref 135–145)

## 2018-05-13 NOTE — Assessment & Plan Note (Signed)
DM: Good compliance with Invokana, metformin.  No recent CBGs but will start checking once or twice a day.  CBG goals discussed. Check A1c.   DM neuropathy: Exam is confirmatory feet care discussed Depression: Extensively counseled, sxs related to his wife health.  Sometimes he feels guilty about not being able to see her every day.  Currently on sertraline 50 mg daily.  Denies any need to increase sertraline. High cholesterol: Well-controlled, last FLP satisfactory Parkinson, saw neurology 04/09/2018, was referred to dermatology due to increased risk of melanoma with Parkinson.  He had a skin cancer removed from the scalp by Dr. Ronnald Ramp Social: Patient is currently living at the independent side of [facility], wife is at the skilled nurse facility side. Preventive care, encouraged to have Td and Shingrix RTC 4 months

## 2018-05-14 MED ORDER — SITAGLIPTIN PHOSPHATE 100 MG PO TABS: 100.0000 mg | ORAL_TABLET | Freq: Every day | ORAL | 6 refills | Status: DC

## 2018-05-14 NOTE — Addendum Note (Signed)
Addended byDamita Dunnings D on: 05/14/2018 03:11 PM   Modules accepted: Orders

## 2018-05-18 ENCOUNTER — Telehealth: Payer: Self-pay | Admitting: Internal Medicine

## 2018-05-18 NOTE — Telephone Encounter (Signed)
See result note.  

## 2018-05-18 NOTE — Telephone Encounter (Signed)
Pt called to get results.  Pt states if you are able to please leave a message with results if he doesn't answer.  Copied from [place] [id number]. Topic: Quick Communication - Lab Results (Clinic Use ONLY) >> May 14, 2018  3:09 PM Damita Dunnings, CMA wrote: Called patient to inform them of lab results. When patient returns call, triage nurse may disclose results.

## 2018-06-04 ENCOUNTER — Ambulatory Visit: Payer: Self-pay | Admitting: *Deleted

## 2018-06-04 NOTE — Telephone Encounter (Signed)
Pt calling with complaints of diarrhea for the past couple of days. Pt states he a simillar episode about a month ago and has been taking Imodium. Pt states imodium has helped with current symptoms.Pt states he had 2 episodes of watery diarrhea on today and yesterday. Pt states he has been able to drink ginger ale without difficulty. Pt denies any abdominal pain, fever or other symptoms at this time. No recent antibiotic use. Pt given home care advice and advised to return call to office with worsening symptoms. Pt verbalized understanding.  Reason for Disposition . MILD-MODERATE diarrhea (e.g., 1-6 times / day more than normal)  Answer Assessment - Initial Assessment Questions 1. DIARRHEA SEVERITY: "How bad is the diarrhea?" "How many extra stools have you had in the past 24 hours than normal?"    - NO DIARRHEA (SCALE 0)   - MILD (SCALE 1-3): Few loose or mushy BMs; increase of 1-3 stools over normal daily number of stools; mild increase in ostomy output.   -  MODERATE (SCALE 4-7): Increase of 4-6 stools daily over normal; moderate increase in ostomy output. * SEVERE (SCALE 8-10; OR 'WORST POSSIBLE'): Increase of 7 or more stools daily over normal; moderate increase in ostomy output; incontinence.     2 episodes on yesterday and 2 episodes on today 2. ONSET: "When did the diarrhea begin?"      Occurred for the past day or so 3. BM CONSISTENCY: "How loose or watery is the diarrhea?"      watery 4. VOMITING: "Are you also vomiting?" If so, ask: "How many times in the past 24 hours?"      No but felt a little bit of nausea 5. ABDOMINAL PAIN: "Are you having any abdominal pain?" If yes: "What does it feel like?" (e.g., crampy, dull, intermittent, constant)      No 6. ABDOMINAL PAIN SEVERITY: If present, ask: "How bad is the pain?"  (e.g., Scale 1-10; mild, moderate, or severe)   - MILD (1-3): doesn't interfere with normal activities, abdomen soft and not tender to touch    - MODERATE (4-7):  interferes with normal activities or awakens from sleep, tender to touch    - SEVERE (8-10): excruciating pain, doubled over, unable to do any normal activities       N/a 7. ORAL INTAKE: If vomiting, "Have you been able to drink liquids?" "How much fluids have you had in the past 24 hours?"     yes 8. HYDRATION: "Any signs of dehydration?" (e.g., dry mouth [not just dry lips], too weak to stand, dizziness, new weight loss) "When did you last urinate?"     No 9. EXPOSURE: "Have you traveled to a foreign country recently?" "Have you been exposed to anyone with diarrhea?" "Could you have eaten any food that was spoiled?"     No travel, but on Tuesday night, lives at [place] and had a fried steak and had 2 small bites and was suspicious of eating it, pt states he has a sensitive stomach 10. ANTIBIOTIC USE: "Are you taking antibiotics now or have you taken antibiotics in the past 2 months?"       No 11. OTHER SYMPTOMS: "Do you have any other symptoms?" (e.g., fever, blood in stool)       No  Protocols used: DIARRHEA-A-AH

## 2018-06-05 ENCOUNTER — Ambulatory Visit: Payer: Self-pay

## 2018-06-05 NOTE — Telephone Encounter (Signed)
Patient called and says he called yesterday and spoke with a nurse about his diarrhea and nausea. He says today he's better, not much diarrhea today. He says he's drinking gatorade, eating light meals and asked is there anything else to do. I advised to follow the advice of the nurse yesterday and continue with what he's doing, advised if the diarrhea worsens or if he feels weaker over the weekend to go to the ED or UC or call back on Monday for an appointment, he verbalized understanding.  Reason for Disposition . [1] Follow-up call to recent contact AND [2] information only call, no triage required  Protocols used: INFORMATION ONLY CALL-A-AH

## 2018-06-11 ENCOUNTER — Other Ambulatory Visit: Payer: Self-pay | Admitting: Neurology

## 2018-06-11 MED ORDER — GABAPENTIN 300 MG PO CAPS: ORAL_CAPSULE | ORAL | 1 refills | Status: DC

## 2018-06-12 ENCOUNTER — Other Ambulatory Visit: Payer: Self-pay | Admitting: Cardiovascular Disease

## 2018-07-16 ENCOUNTER — Ambulatory Visit (INDEPENDENT_AMBULATORY_CARE_PROVIDER_SITE_OTHER): Payer: Medicare Other | Admitting: *Deleted

## 2018-07-16 ENCOUNTER — Other Ambulatory Visit: Payer: Self-pay

## 2018-07-16 ENCOUNTER — Other Ambulatory Visit: Payer: Self-pay | Admitting: Internal Medicine

## 2018-07-16 DIAGNOSIS — I495 Sick sinus syndrome: Secondary | ICD-10-CM | POA: Diagnosis not present

## 2018-07-16 LAB — CUP PACEART REMOTE DEVICE CHECK
Battery Remaining Longevity: 132 mo
Battery Remaining Percentage: 100 %
Brady Statistic RA Percent Paced: 33 %
Brady Statistic RV Percent Paced: 1 %
Date Time Interrogation Session: 20200423133700
Implantable Lead Implant Date: 20151013
Implantable Lead Implant Date: 20151013
Implantable Lead Location: 753859
Implantable Lead Location: 753860
Implantable Lead Model: 4135
Implantable Lead Model: 4136
Implantable Lead Serial Number: 29480373
Implantable Lead Serial Number: 29608302
Implantable Pulse Generator Implant Date: 20151013
Lead Channel Impedance Value: 551 Ohm
Lead Channel Impedance Value: 655 Ohm
Lead Channel Setting Pacing Amplitude: 2 V
Lead Channel Setting Pacing Amplitude: 4 V
Lead Channel Setting Pacing Pulse Width: 1 ms
Lead Channel Setting Sensing Sensitivity: 2.5 mV
Pulse Gen Serial Number: 702951

## 2018-07-24 ENCOUNTER — Encounter: Payer: Self-pay | Admitting: Cardiology

## 2018-07-24 NOTE — Progress Notes (Signed)
Remote pacemaker transmission.   

## 2018-08-18 DIAGNOSIS — S4991XA Unspecified injury of right shoulder and upper arm, initial encounter: Secondary | ICD-10-CM | POA: Diagnosis not present

## 2018-08-18 DIAGNOSIS — S51011A Laceration without foreign body of right elbow, initial encounter: Secondary | ICD-10-CM | POA: Diagnosis not present

## 2018-08-18 DIAGNOSIS — R52 Pain, unspecified: Secondary | ICD-10-CM | POA: Diagnosis not present

## 2018-08-18 DIAGNOSIS — M7581 Other shoulder lesions, right shoulder: Secondary | ICD-10-CM | POA: Diagnosis not present

## 2018-08-18 DIAGNOSIS — M79645 Pain in left finger(s): Secondary | ICD-10-CM | POA: Diagnosis not present

## 2018-08-18 DIAGNOSIS — W19XXXA Unspecified fall, initial encounter: Secondary | ICD-10-CM | POA: Diagnosis not present

## 2018-08-18 DIAGNOSIS — R58 Hemorrhage, not elsewhere classified: Secondary | ICD-10-CM | POA: Diagnosis not present

## 2018-08-18 DIAGNOSIS — S6992XA Unspecified injury of left wrist, hand and finger(s), initial encounter: Secondary | ICD-10-CM | POA: Diagnosis not present

## 2018-08-18 DIAGNOSIS — M5136 Other intervertebral disc degeneration, lumbar region: Secondary | ICD-10-CM | POA: Diagnosis not present

## 2018-08-18 DIAGNOSIS — M79601 Pain in right arm: Secondary | ICD-10-CM | POA: Diagnosis not present

## 2018-08-18 DIAGNOSIS — M19011 Primary osteoarthritis, right shoulder: Secondary | ICD-10-CM | POA: Diagnosis not present

## 2018-08-20 ENCOUNTER — Encounter: Payer: Self-pay | Admitting: Internal Medicine

## 2018-08-21 ENCOUNTER — Other Ambulatory Visit: Payer: Self-pay | Admitting: Internal Medicine

## 2018-08-28 ENCOUNTER — Encounter: Payer: Self-pay | Admitting: Neurology

## 2018-09-10 ENCOUNTER — Ambulatory Visit: Payer: Medicare Other | Admitting: Neurology

## 2018-09-15 ENCOUNTER — Encounter: Payer: Self-pay | Admitting: Internal Medicine

## 2018-09-15 ENCOUNTER — Ambulatory Visit (INDEPENDENT_AMBULATORY_CARE_PROVIDER_SITE_OTHER): Payer: Medicare Other | Admitting: Internal Medicine

## 2018-09-15 ENCOUNTER — Other Ambulatory Visit: Payer: Self-pay

## 2018-09-15 VITALS — BP 127/106 | HR 93 | Temp 98.2°F | Resp 16 | Ht 69.0 in | Wt 172.4 lb

## 2018-09-15 DIAGNOSIS — E039 Hypothyroidism, unspecified: Secondary | ICD-10-CM

## 2018-09-15 DIAGNOSIS — E785 Hyperlipidemia, unspecified: Secondary | ICD-10-CM

## 2018-09-15 DIAGNOSIS — R42 Dizziness and giddiness: Secondary | ICD-10-CM

## 2018-09-15 DIAGNOSIS — Z0100 Encounter for examination of eyes and vision without abnormal findings: Secondary | ICD-10-CM

## 2018-09-15 DIAGNOSIS — F329 Major depressive disorder, single episode, unspecified: Secondary | ICD-10-CM

## 2018-09-15 DIAGNOSIS — F419 Anxiety disorder, unspecified: Secondary | ICD-10-CM

## 2018-09-15 DIAGNOSIS — F32A Depression, unspecified: Secondary | ICD-10-CM

## 2018-09-15 DIAGNOSIS — E119 Type 2 diabetes mellitus without complications: Secondary | ICD-10-CM

## 2018-09-15 DIAGNOSIS — R269 Unspecified abnormalities of gait and mobility: Secondary | ICD-10-CM | POA: Diagnosis not present

## 2018-09-15 LAB — HEMOGLOBIN A1C: Hgb A1c MFr Bld: 6.8 % — ABNORMAL HIGH (ref 4.6–6.5)

## 2018-09-15 LAB — TSH: TSH: 1.75 u[IU]/mL (ref 0.35–4.50)

## 2018-09-15 NOTE — Progress Notes (Signed)
Pre visit review using our clinic review tool, if applicable. No additional management support is needed unless otherwise documented below in the visit note. 

## 2018-09-15 NOTE — Patient Instructions (Signed)
Get the blood work     [facility] Schedule your next appointment  For a check up in 4 months     Check the  blood pressure   Weekly  GOAL  is between 110/65 and  135/85. If it is consistently higher or lower, let me know

## 2018-09-15 NOTE — Progress Notes (Signed)
Subjective:    Patient ID: Stephen Robbins, male    DOB: 1935/07/09, 83 y.o.   MRN: [id number]  DOS:  09/15/2018 Type of visit - description: rov Fall: Since the last office visit, he had a fall at the end of one of his routine walks.  Fortunately was a soft landing, went to [facility], there were no major consequences.  X-rays negative.  No further problems. HTN: BP is slightly elevated today but was normal last month DM: Added Januvia based on last A1c, he has not seen a major difference in her CBGs however here lately is not checking them regularly. Stress: Still concern/stress and somewhat frustrated because he cannot see his wife who is in a nursing home. Needs ophthalmology referral. Needs a parking permit.  Review of Systems Denies fever chills No chest pain no difficulty breathing No cough  Past Medical History:  Diagnosis Date  . Arthritis    "joints; a little" (01/04/2014)  . Asymptomatic bilateral carotid artery stenosis    per duplex  05-15-2012  left >39%/   right 40-59%  . Basal cell carcinoma    nose  . Borderline diabetes   . BPH (benign prostatic hypertrophy) with urinary obstruction   . Bradycardia   . Bronchial pneumonia 1958  . Chronotropic incompetence with sinus node dysfunction (HCC)   . Compressed cervical disc   . Compression of lumbar vertebra (HCC)    L4 -- L5  . Dizzy   . Dysmetabolic syndrome   . Fatigue   . Frequency of urination   . GERD (gastroesophageal reflux disease)    occasional  . Hemorrhoids   . Hyperlipidemia   . Hypothyroidism   . Nocturia   . NSVT (nonsustained ventricular tachycardia) [facility])    cardiologist-  dr croitoru  . Pacemaker   . Urgency of urination   . Wears glasses     Past Surgical History:  Procedure Laterality Date  . BASAL CELL CARCINOMA EXCISION     nose  . CLOSED REDUCTION NASAL FRACTURE  09-01-2007  . CYSTOSCOPY N/A 12/28/2012   Procedure: CYSTOSCOPY;  Surgeon: Bernestine Amass, MD;  Location:  [facility];  Service: Urology;  Laterality: N/A;  . EXERCISE TOLERENCE TEST  12-03-2012  DR CROITURO   CHRONOTROPIC INCOMPETENCE/ NORMAL RESTING BP W/ APPROPRIATE RESPONSE/ NO CHEST PAIN/ NO ST CHANGES FROM BASELINE  . INSERT / REPLACE / REMOVE PACEMAKER  01/04/2014   [employer] model L121 serial number [id number]  . LACERATION REPAIR Right 1978   middle finger  . NEUROPLASTY / TRANSPOSITION ULNAR NERVE AT ELBOW Left 02-09-2010  . PERMANENT PACEMAKER INSERTION N/A 01/04/2014   Procedure: PERMANENT PACEMAKER INSERTION;  Surgeon: Sanda Klein, MD;  Location: [place] CATH [facility];  Service: Cardiovascular;  Laterality: N/A;  . SKIN BIOPSY     scc  . TONSILLECTOMY AND ADENOIDECTOMY  1944  . TRANSURETHRAL INCISION OF PROSTATE N/A 12/28/2012   Procedure: TRANSURETHRAL INCISION OF THE PROSTATE (TUIP);  Surgeon: Bernestine Amass, MD;  Location: [facility];  Service: Urology;  Laterality: N/A;  . ULNAR NERVE TRANSPOSITION      Social History   Socioeconomic History  . Marital status: Married    Spouse name: Not on file  . Number of children: 0  . Years of education: Not on file  . Highest education level: Not on file  Occupational History  . Occupation: retired    Fish farm manager: RETIRED    Comment: executive  Social Needs  . Financial resource strain: Not on file  . Food insecurity    Worry: Not on file    Inability: Not on file  . Transportation needs    Medical: Not on file    Non-medical: Not on file  Tobacco Use  . Smoking status: Former Smoker    Packs/day: 1.00    Years: 10.00    Pack years: 10.00    Types: Cigarettes    Quit date: 03/25/1968    Years since quitting: 50.5  . Smokeless tobacco: Never Used  Substance and Sexual Activity  . Alcohol use: Yes    Alcohol/week: 7.0 standard drinks    Types: 7 Glasses of wine per week    Comment: 1 glass wine daily  . Drug use: No  . Sexual activity: Not Currently  Lifestyle  . Physical activity     Days per week: Not on file    Minutes per session: Not on file  . Stress: Not on file  Relationships  . Social Herbalist on phone: Not on file    Gets together: Not on file    Attends religious service: Not on file    Active member of club or organization: Not on file    Attends meetings of clubs or organizations: Not on file    Relationship status: Not on file  . Intimate partner violence    Fear of current or ex partner: Not on file    Emotionally abused: Not on file    Physically abused: Not on file    Forced sexual activity: Not on file  Other Topics Concern  . Not on file  Social History Narrative   Lives at [facility] as of 09/15/2018      Reactions   Penicillins Other (See Comments)   Unknown childhood reaction      Medication List       Accurate as of September 15, 2018 11:59 PM. If you have any questions, ask your nurse or doctor.        Accu-Chek [employer] Kit Check blood sugar once daily   Accu-Chek [employer] Lancets Misc Check blood sugar three times daily   Accu-Chek Guide Me w/Device Kit 1 Device by Does not apply route as directed.   bisacodyl 5 MG EC tablet Commonly known as: DULCOLAX Take 5 mg daily as needed by mouth for moderate constipation.   canagliflozin 300 MG Tabs tablet Commonly known as: Invokana Take 1 tablet (300 mg total) by mouth daily before breakfast.   carbidopa-levodopa 25-100 MG tablet Commonly known as: SINEMET IR TAKE 2 TABLETS BY MOUTH THREE TIMES DAILY   gabapentin 300 MG capsule Commonly known as: NEURONTIN TAKE 1 CAPSULE(300 MG) BY MOUTH TWICE DAILY   glucose blood test strip Commonly known as: Accu-Chek Guide Check blood sugar three times daily   hydrocortisone 2.5 % rectal cream Commonly known as: Proctozone-HC Place 1 application rectally 2 (two) times daily.   Hydrocortisone Ace-Pramoxine 2.5-1 % Crea Apply rectally twice daily as needed   hydrocortisone cream 0.5 % Apply  1 application 2 (two) times daily as needed topically. rash   ketoconazole 2 % cream Commonly known as: NIZORAL Apply 1 application topically daily.   levothyroxine 75 MCG tablet Commonly known as: SYNTHROID Take 1 tablet (75 mcg total) by mouth daily before breakfast.   metFORMIN 500 MG 24 hr tablet Commonly known as: GLUCOPHAGE-XR Take 2 tablets (1,000 mg total) by  mouth daily with breakfast.   Myrbetriq 25 MG Tb24 tablet Generic drug: mirabegron ER Take 25 mg by mouth daily.   polyethylene glycol powder 17 GM/SCOOP powder Commonly known as: MiraLax Take 17 g daily by mouth.   sertraline 50 MG tablet Commonly known as: ZOLOFT Take 1 tablet (50 mg total) by mouth daily.   simvastatin 20 MG tablet Commonly known as: ZOCOR Take 1 tablet (20 mg total) by mouth at bedtime.   sitaGLIPtin 100 MG tablet Commonly known as: Januvia Take 1 tablet (100 mg total) by mouth daily.   Systane Ultra 0.4-0.3 % Soln Generic drug: Polyethyl Glycol-Propyl Glycol Place 1 drop into both eyes daily as needed (dry eyes).   vitamin B-12 1000 MCG tablet Commonly known as: CYANOCOBALAMIN Take 1,000 mcg by mouth every morning.   Xarelto 20 MG Tabs tablet Generic drug: rivaroxaban TAKE 1 TABLET BY MOUTH DAILY           Objective:   Physical Exam BP (!) 127/106 (BP Location: Left Arm, Patient Position: Sitting, Cuff Size: Small)   Pulse 93   Temp 98.2 F (36.8 C) (Oral)   Resp 16   Ht 5' 9"  (1.753 m)   Wt 172 lb 6 oz (78.2 kg)   SpO2 95%   BMI 25.46 kg/m  General:   Well developed, NAD, BMI noted.  HEENT:  Normocephalic . Face symmetric, atraumatic Lungs:  CTA B Normal respiratory effort, no intercostal retractions, no accessory muscle use. Heart: RRR,  no murmur.  no pretibial edema bilaterally  Abdomen:  Not distended, soft, non-tender. No rebound or rigidity.  No bruits or mass Skin: Not pale. Not jaundice Neurologic:  alert & oriented X3.  Speech normal, gait  appropriate for age and unassisted Psych--  Cognition and judgment appear intact.  Cooperative with normal attention span and concentration.  Behavior appropriate. No anxious or depressed appearing.     Assessment     Assessment. (New patient 11/18/17, transferring from Dr. Raliegh Ip) Diabetes DM Neuropathy Hypothyroidism Depression Hyperlipidemia CV: Paroxysmal A. fib, sick sinus syndrome,  pacemaker, NSVT, orthostatic hypotension, on Xarelto Hypogonadism hypogonadism NEURO: Dr [name] --Parkinson disease --Peripheral neuropathy Hemorrhoids  Urology Dr Lovena Neighbours : urine incontinence  Caregiver fatigue  PLAN DM: Last A1c was 8.2, Januvia was added to metformin and Invokana.  No apparent side effects.  Check A1c Request a referral to ophthalmology.  Sent. Depression: We had extensive conversation about his wife, she is still in nursing home and he is unable to see her. Hypothyroidism: Check a TSH Gait disorder: Had a fall, encouraged to continue using a cane when she takes a walk. Offered a   PT but he already has a program with neurology, he will let me know if he needs PT referral Recommend a flu shot in the next few months RTC 4 months    Today, I spent more than  25  min with the patient: >50% of the time counseling regards his stress and frustration because he is unable to see his wife, provided listening therapy.  Also talking about the importance of fall precautions, declined need PT.  Also doing chart review from recent visit to the [facility].

## 2018-09-16 NOTE — Assessment & Plan Note (Signed)
DM: Last A1c was 8.2, Januvia was added to metformin and Invokana.  No apparent side effects.  Check A1c Request a referral to ophthalmology.  Sent. Depression: We had extensive conversation about his wife, she is still in nursing home and he is unable to see her. Hypothyroidism: Check a TSH Gait disorder: Had a fall, encouraged to continue using a cane when she takes a walk. Offered a   PT but he already has a program with neurology, he will let me know if he needs PT referral Recommend a flu shot in the next few months RTC 4 months

## 2018-09-18 ENCOUNTER — Other Ambulatory Visit: Payer: Self-pay | Admitting: Internal Medicine

## 2018-09-24 NOTE — Progress Notes (Deleted)
Stephen Robbins was seen today in f/u in the movement clinic.  He was dx with PD last visit, on 06/17/13.  He is on carbidopa/levodopa 25/100 tid.  He has no side effects with the carbidopa/levodopa, but states that "I just don't know what to expect."  He continues to have tremor, which can be bothersome for him.  He has had no falls.  No lightheadedness.  No near syncope.  No hallucinations.  He had a carotid ultrasound done since last visit.  There is 1-39% stenosis bilaterally.  He did have some lab work done since last visit.  His hemoglobin A1c has greatly improved.  It is 6.3 (was 7.9 and prior to that was 9.4). He just finished PT and is enrolled in the exercise class now.  He presents today with a long list of questions.  He again tape records our session.    11/19/13 update:  Pt is f/u regarding Parkinson's disease.  He is currently on carbidopa/levodopa 25/100, one tablet 3 times per day. He is accompanied by his wife, who supplements the history.   Patient denies any falls.  He denies hallucinations.  Denies nausea or vomiting.  No lightheadedness or near syncope.  He is exercising.  He still has tremor and isn't sure that the levodopa helps tremor.  He sent me several e-mails since last visit.  One of these was just a TV news link regarding [facility] high-resolution imaging and deep brain stimulation.  He subsequently sent another e-mail regarding focused ultrasound.  Asks about updates regarding these and has 2 pages of questions.  Asks about why we can't give him human growth hormone or steroids for PD.  Overall, patient is clinically doing well.   02/21/14 update:   Pt returns for f/u, accompanied by his wife who supplements the history.  The records that were made available to me were reviewed.  Pt has had PPM since last visit due to bradycardia.  He has been told not to exercise until 12/9.  He will start with PWR moves classes after the first of the year as well as circuit 2 classes.  He is still  on carbidopa/levodopa 25/100 at 8-9am/12-1pm/6-7 pm.  He is c/o paresthesias/"neuropathy" in the R foot.  He only notices it with driving.   He has no back pain.  He has no sensation of balance loss with closing eyes in the shower.  He thinks that exposure to levodopa causes the neuropathy and brings with him a small study (58 pts) that suggests that levodopa could be an etiology for the neuropathy.  Notices lack of smell.  No falls.  Rare lightheadedness. No hallucinations.    05/23/14 update:  Pt is f/u today, accompanied by his wife who supplements the history.  He went to [facility] since our last visit and I reviewed those records.  He was told he had PN (known diagnosis) but no new meds were prescribed.  He asks about a "cure" for PN that a doctor in [place] was advertising.   He remains on carbidopa/levodopa 25/100 tid for the PD.  He is exercising faithfully.  He has trouble getting himself over 3 miles per hour when walking and he is concerned about that.  He asks again about taking steroids and human growth hormone.  He also asks about taking protein supplements.  He has d/c the glucosamine for joint pain/arthritis as he didn't think that it was helpful.    09/08/14 update:  The  patient is following up today.  He is accompanied by his wife who supplements the history.   Records were reviewed since last visit, from his primary care physician, podiatrist, as well as his cardiologist.  He remains on carbidopa/levodopa 25/100, one tablet 3 times per day.  He notices no wearing off.  He is doing PD circuit class.  He called his podiatrist since last visit and asked him about medications for neuropathy and he deferred to me.  We started gabapentin on May 30, 2014.  He is on 300 mg twice a day.  He states that his feet numbness is better.  He has continued to complain about fatigue.  His pacemaker was recently adjusted.  He is trying to walk/exercise at the park and his endurance has been better ever  since.  He again asks about energy.  He is back on testosterone for that.    01/12/15 update:  The patient returns today for follow-up, accompanied by his wife who supplements the history.  He remains on carbidopa/levodopa 25/100, one tablet 3 times per day.  Overall, the patient states that he is doing well.   Still has some intermittent tremor.  He denies any syncope.  He has had no falls since last visit.  He has had some near falls.  No hallucinations or visual distortions.  He is on gabapentin, 300 mg twice a day for peripheral neuropathy secondary to diabetes mellitus. Pt states that it is helping with the sx's, along with the "natural herb that I take."   He continues to faithfully exercise with the PWR classes.  Some lightheaded after exercise.  No new medical issues since last visit.    04/18/15 update:  The patient presents today, accompanied by his wife who supplements the history.  Last visit, increased the patients carbidopa/levodopa 25/100, so that he is now taking 1-1/2 tablets 3 times per day.   When asked how he feel, he states, "I don't know how I am supposed to feel."  He denies stiffness.  He has minimal thumb tremor.   He is supposed to be on gabapentin, 300 mg twice a day for peripheral neuropathy but went down to q day because his feet weren't bothering him.  I have reviewed records made available to me since last visit.  He was diagnosed with atrial fibrillation and started on Xarelto.  He is doing well with that.  He denies any bleeding difficulties.  No falls.  No near syncope.  Rarely does he even feel unstable.  He is exercising.  Goes to the gym three times a week.    08/22/15 update:  The patient presents today, accompanied by his wife who supplements the history. He is on carbidopa/levodopa 25/100, 1-1/2 tablets 3 times per day.  He remains on gabapentin, 300 mg once a day for peripheral neuropathy (7-9pm). He states that he doesn't think that he can come off of it, at least until  he moves.  He did come to the PD support group lecture that I gave 2 weeks ago.  He denies hallucinations, lightheadedness, near syncope.  He does state that they are moving to [place] in assisted living.  States that there has been some stress with moving, as he is having to fix a hole that was discovered in the bathroom floor.   Asks me about alpha-lipoic acid and states that the people who sell it to him "think that it is good."    12/26/15 update:  The patient follows up today,  accompanied by his wife who supplements the history.  He remains on carbidopa/levodopa 25/100, 1-1/2 tablets 3 times per day.  He is still on gabapentin, 300 mg at night for neuropathy.  States that he doesn't want to try and d/c that.    He has since moved to [place] and they are adjusting well.  They have sold their house.  He is doing a [employer] class.  No falls.  Some near falls.    04/29/16 update:  Patient follows up today, accompanied by his wife who supplements the history.  Patient is on carbidopa/levodopa 25/100, 1.5 tablets 3 times per day (8am/noon/5pm).  Pt denies falls.  Pt denies lightheadedness, near syncope.  No hallucinations.  Mood has been "up and down."  Occasional bouts of constipation vs diarrhea.   On gabapentin 300 q hs for PN.  Did OT/ST since our last visit and reviewed those notes.  Doing PWR moves classes on wednesdays.    09/03/16 update:  Patient seen today in follow-up, accompanied by his wife who supplements the history.  Patient remains on carbidopa/levodopa 25/100 25/100, 1-1/2 tablets at 8 AM/noon/5 PM.  He has had no falls.  No lightheadedness or near syncope.  No hallucinations.  Remains on gabapentin, but now on 300 mg bid.  Seems to be doing better with the neuropathic sx's now.  The records that were made available to me were reviewed.  Metformin started by pcp as HgBA1C was rising.  Has some constipation.  He pulled me to the side and states that he is slightly more depressed dealing with  his wifes alzheimers.  No SI/HI.    02/07/17 update: Patient is seen today in follow-up.  He is accompanied by his wife who supplements the history.  Patient is on carbidopa/levodopa 25/100, 1-1/2 tablets 3 times per day.  Patient has had no falls since last visit.  No hallucinations.  He is on gabapentin, 300 mg twice per day for paresthesias.  This seems to be helping and is well controlled and he doesn't even always take the evening gabapentin dosage.  He has had no lightheadedness or near syncope.  He continues to have some stress associated with caregiving for his wife who has dementia.  Records have been reviewed since last visit.  He was in the emergency room twice since last visit, once for constipation and once for low back pain.  When asked how much water he was drinking he stated "obviously not enough."  He has not tried the rancho recipe and doesn't remember that I gave it to him.  Asks for another copy.  He had labwork few days ago and reports that his thyroid medication was adjusted to account for high TSH.   06/17/17 update: Patient is seen today in follow-up.  The patient is accompanied by his wife who supplements the history.  The patient is doing well on carbidopa/levodopa 25/100, 1.5 tablets 3 times per day.  No falls, no hallucinations.  No lightheadedness or near syncope.  Records have been reviewed since our last visit.  He went to the [facility] support group but is somewhat frustrated at the time of it.  He is following rancho recipe and uses some miralax.  States that he drinks about 30 oz per day of water.  Still with some constipation.  Asks about special diets for PD.    11/06/17 update: Patient is seen today in follow-up for Parkinson's disease.  He is accompanied by his wife who supplements the history.  Patient is on carbidopa/levodopa 25/100, 1.5 tablets 3 times per day.  Recommended speech therapy last visit, but he wanted to go in [facility] and if there were no [facility] loud  certified therapist (which he had desired).  He has had no falls since last visit.  Uses cane prn (after first awakens).  No lightheadedness or near syncope except a little lightheaded when he finishes exercise.  Does PWR Moves class in [place].  No hallucinations.  I have reviewed records since our last visit.  He saw cardiology on October 07, 2017.  No changes were made to medications.  He is transferring care to Dr. [name] as that is a closer location for him.  He has an appointment on August 27 with him.  Last dermatology visit: late spring, 2019.  C/o constipation.    04/09/18 update: Patient is seen today in follow-up for Parkinson's disease. Patient was on carbidopa/levodopa 25/100, which was slightly increased last visit to 2 tablets 3 times per day.  Doing well with this.  No hallucinations.  No lightheadedness except in the early AM (and he uses a cane then) or or near syncope.  No falls but had some near falls.  No sleep attacks.  Records are reviewed since last visit.  Last saw primary care on February 06, 2018.  wife fx foot in September and that has been the biggest stress as she had to be in the hospital for a week.  That made her dementia worse and she still isn't able to walk well. She is now in the long term care aspect which has been hard on him.  His mood has "not been well."  "I'm not contemplating destruction and I'm not drinking."  Is on sertraline, 50 mg daily.  States that it was just increased about 4 weeks ago and he doesn't want to increase further right now.  He thinks that the increase helped.  Speech has been softer but hasn't had time to do ST.  Not able to exercise right now.    09/28/18 update: Patient seen today in follow-up for Parkinson's disease.  Patient is on carbidopa/levodopa 25/100, 2 tablets 3 times per day.  No falls since last visit.  No lightheadedness or near syncope.  No hallucinations.  Is on gabapentin, 300 mg, 1 to 2 tablets in the evening for burning paresthesias in  the feet.  No neuroimaging since CT brain in 2009.  PREVIOUS MEDICATIONS: none to date  ALLERGIES:   Allergies  Allergen Reactions   Penicillins Other (See Comments)    Unknown childhood reaction    CURRENT MEDICATIONS:  Current Outpatient Medications on File Prior to Visit  Medication Sig Dispense Refill   ACCU-CHEK FASTCLIX LANCETS MISC Check blood sugar three times daily 300 each 12   bisacodyl (DULCOLAX) 5 MG EC tablet Take 5 mg daily as needed by mouth for moderate constipation.     Blood Glucose Monitoring Suppl (ACCU-CHEK GUIDE ME) w/Device KIT 1 Device by Does not apply route as directed. 1 kit 0   canagliflozin (INVOKANA) 300 MG TABS tablet Take 1 tablet (300 mg total) by mouth daily before breakfast. 90 tablet 1   carbidopa-levodopa (SINEMET IR) 25-100 MG tablet TAKE 2 TABLETS BY MOUTH THREE TIMES DAILY 540 tablet 1   gabapentin (NEURONTIN) 300 MG capsule TAKE 1 CAPSULE(300 MG) BY MOUTH TWICE DAILY 180 capsule 1   glucose blood (ACCU-CHEK GUIDE) test strip Check blood sugar three times daily 300 each 12   hydrocortisone (PROCTOZONE-HC) 2.5 %  rectal cream Place 1 application rectally 2 (two) times daily. (Patient not taking: Reported on 05/12/2018) 30 g 5   hydrocortisone cream 0.5 % Apply 1 application 2 (two) times daily as needed topically. rash 30 g 3   ketoconazole (NIZORAL) 2 % cream Apply 1 application topically daily. 30 g 0   Lancets Misc. (ACCU-CHEK FASTCLIX LANCET) KIT Check blood sugar once daily 6 kit 3   levothyroxine (SYNTHROID) 75 MCG tablet Take 1 tablet (75 mcg total) by mouth daily before breakfast. 90 tablet 1   metFORMIN (GLUCOPHAGE-XR) 500 MG 24 hr tablet Take 2 tablets (1,000 mg total) by mouth daily with breakfast. 180 tablet 1   mirabegron ER (MYRBETRIQ) 25 MG TB24 tablet Take 25 mg by mouth daily.     Polyethyl Glycol-Propyl Glycol (SYSTANE ULTRA) 0.4-0.3 % SOLN Place 1 drop into both eyes daily as needed (dry eyes).      polyethylene  glycol powder (MIRALAX) powder Take 17 g daily by mouth. (Patient not taking: Reported on 05/12/2018) 255 g 0   Pramoxine-HC (HYDROCORTISONE ACE-PRAMOXINE) 2.5-1 % CREA Apply rectally twice daily as needed (Patient not taking: Reported on 05/12/2018) 28.35 g 0   sertraline (ZOLOFT) 50 MG tablet Take 1 tablet (50 mg total) by mouth daily. 30 tablet 6   simvastatin (ZOCOR) 20 MG tablet Take 1 tablet (20 mg total) by mouth at bedtime. 90 tablet 1   sitaGLIPtin (JANUVIA) 100 MG tablet Take 1 tablet (100 mg total) by mouth daily. 30 tablet 6   vitamin B-12 (CYANOCOBALAMIN) 1000 MCG tablet Take 1,000 mcg by mouth every morning.      XARELTO 20 MG TABS tablet TAKE 1 TABLET BY MOUTH DAILY 90 tablet 1   No current facility-administered medications on file prior to visit.     PAST MEDICAL HISTORY:   Past Medical History:  Diagnosis Date   Arthritis    "joints; a little" (01/04/2014)   Asymptomatic bilateral carotid artery stenosis    per duplex  05-15-2012  left >39%/   right 40-59%   Basal cell carcinoma    nose   Borderline diabetes    BPH (benign prostatic hypertrophy) with urinary obstruction    Bradycardia    Bronchial pneumonia 1958   Chronotropic incompetence with sinus node dysfunction (HCC)    Compressed cervical disc    Compression of lumbar vertebra (HCC)    L4 -- L5   Dizzy    Dysmetabolic syndrome    Fatigue    Frequency of urination    GERD (gastroesophageal reflux disease)    occasional   Hemorrhoids    Hyperlipidemia    Hypothyroidism    Nocturia    NSVT (nonsustained ventricular tachycardia) [facility])    cardiologist-  dr croitoru   Pacemaker    Urgency of urination    Wears glasses     PAST SURGICAL HISTORY:   Past Surgical History:  Procedure Laterality Date   BASAL CELL CARCINOMA EXCISION     nose   CLOSED REDUCTION NASAL FRACTURE  09-01-2007   CYSTOSCOPY N/A 12/28/2012   Procedure: CYSTOSCOPY;  Surgeon: Bernestine Amass, MD;   [facility];  Service: Urology;  Laterality: N/A;   EXERCISE TOLERENCE TEST  12-03-2012  DR CROITURO   CHRONOTROPIC INCOMPETENCE/ NORMAL RESTING BP W/ APPROPRIATE RESPONSE/ NO CHEST PAIN/ NO ST CHANGES FROM BASELINE   INSERT / REPLACE / REMOVE PACEMAKER  01/04/2014   [employer] model L121 serial number [id number]   LACERATION REPAIR  Right 1978   middle finger   NEUROPLASTY / TRANSPOSITION ULNAR NERVE AT ELBOW Left 02-09-2010   PERMANENT PACEMAKER INSERTION N/A 01/04/2014   Procedure: PERMANENT PACEMAKER INSERTION;  Surgeon: Sanda Klein, MD;  Location: [place] C[facility];  Service: Cardiovascular;  Laterality: N/A;   SKIN BIOPSY     scc   TONSILLECTOMY AND ADENOIDECTOMY  1944   TRANSURETHRAL INCISION OF PROSTATE N/A 12/28/2012   Procedure: TRANSURETHRAL INCISION OF THE PROSTATE (TUIP);  Surgeon: Bernestine Amass, MD;  [facility];  Service: Urology;  Laterality: N/A;   ULNAR NERVE TRANSPOSITION      SOCIAL HISTORY:   Social History   Socioeconomic History   Marital status: Married    Spouse name: Not on file   Number of children: 0   Years of education: Not on file   Highest education level: Not on file  Occupational History   Occupation: retired    Fish farm manager: RETIRED    Comment: executive   Social Designer, fashion/clothing strain: Not on file   Food insecurity    Worry: Not on file    Inability: Not on file   Transportation needs    Medical: Not on file    Non-medical: Not on file  Tobacco Use   Smoking status: Former Smoker    Packs/day: 1.00    Years: 10.00    Pack years: 10.00    Types: Cigarettes    Quit date: 03/25/1968    Years since quitting: 50.5   Smokeless tobacco: Never Used  Substance and Sexual Activity   Alcohol use: Yes    Alcohol/week: 7.0 standard drinks    Types: 7 Glasses of wine per week    Comment: 1 glass wine daily   Drug use: No   Sexual activity: Not Currently    Lifestyle   Physical activity    Days per week: Not on file    Minutes per session: Not on file   Stress: Not on file  Relationships   Social connections    Talks on phone: Not on file    Gets together: Not on file    Attends religious service: Not on file    Active member of club or organization: Not on file    Attends meetings of clubs or organizations: Not on file    Relationship status: Not on file   Intimate partner violence    Fear of current or ex partner: Not on file    Emotionally abused: Not on file    Physically abused: Not on file    Forced sexual activity: Not on file  Other Topics Concern   Not on file  Social History Narrative   Lives at [place]:   Family Status  Relation Name Status   Father  Deceased       stroke   Mother  Deceased       lung cancer   Brother  Alive       healthy    ROS:    ROS ]  PHYSICAL EXAMINATION:    VITALS:   There were no vitals filed for this visit. Wt Readings from Last 3 Encounters:  09/15/18 172 lb 6 oz (78.2 kg)  05/12/18 171 lb (77.6 kg)  04/09/18 169 lb (76.7 kg)    GEN:  The patient appears stated age and is in NAD. HEENT:  Normocephalic, atraumatic.  The mucous membranes are moist. The superficial temporal arteries are without  ropiness or tenderness. CV:  RRR Lungs:  CTAB Neck/HEME:  There are no carotid bruits bilaterally.  Neurological examination:  Orientation: The patient is alert and oriented x3. Cranial nerves: There is good facial symmetry. The speech is fluent and clear. Soft palate rises symmetrically and there is no tongue deviation. Hearing is intact to conversational tone. Sensation: Sensation is intact to light touch throughout Motor: Strength is 5/5 in the bilateral upper and lower extremities.   Shoulder shrug is equal and symmetric.  There is no pronator drift.   Movement examination: Tone: There is minor increased tone in the LUE Abnormal movements: There is  no tremor today Coordination:  There is no decremation, with any form of RAMS, including alternating supination and pronation of the forearm, hand opening and closing, finger taps, heel taps and toe taps bilaterally Gait and Station: The patient has no difficulty arising out of a deep-seated chair without the use of the hands.  No start hesitation.  No stooped posture today.  Negative pull test.    LABS:  Lab Results  Component Value Date   TSH 1.75 09/15/2018   Lab Results  Component Value Date   VITAMINB12 872 06/17/2013     Chemistry      Component Value Date/Time   NA 136 05/12/2018 1443   K 3.9 05/12/2018 1443   CL 101 05/12/2018 1443   CO2 27 05/12/2018 1443   BUN 17 05/12/2018 1443   CREATININE 0.80 05/12/2018 1443   CREATININE 0.77 12/29/2013 1627      Component Value Date/Time   CALCIUM 9.5 05/12/2018 1443   ALKPHOS 39 02/04/2017 1207   AST 21 05/12/2018 1443   ALT 9 05/12/2018 1443   BILITOT 0.9 02/04/2017 1207     RPR negative Lab Results  Component Value Date   FOLATE >20.0 06/17/2013     Lab Results  Component Value Date   HGBA1C 6.8 (H) 09/15/2018    ASSESSMENT/PLAN:  1. idiopathic Parkinson's disease.  I see no atypical features.    -continue carbidopa/levodopa 25/100, 2 po tid.  Risks, benefits, side effects and alternative therapies were discussed.  The opportunity to ask questions was given and they were answered to the best of my ability.  The patient expressed understanding and willingness to follow the outlined treatment protocols.  -needs to r/s derm appt.    PD increases risk of melanoma.   2.  Peripheral neuropathy  -There is evidence of this on examination.  Likely from DM.  His workup for reversible causes was negative.  His diabetes is under much better control than in the past.  -currently on gabapentin - 1 -2 times per day.  He does wish to continue given paresthesias in the feet. 3.  Hx of transaminasemia  -These were elevated in  2012 but have improved 4.  Constipation  -doesn't want to add miralax/colace.   5.  Depression   -associated with wife/caregiving.    -he just had his sertraline increased to 50 mg daily. He and I discussed that this can be increased further.   Risks, benefits, side effects and alternative therapies were discussed.  The opportunity to ask questions was given and they were answered to the best of my ability.  The patient expressed understanding and willingness to follow the outlined treatment protocols. 6.  Follow up is anticipated in the next few months, sooner should new neurologic issues arise.  Much greater than 50% of this visit was spent in counseling and coordinating care.  Total face to face time:  25 min

## 2018-09-28 ENCOUNTER — Ambulatory Visit: Payer: Medicare Other | Admitting: Neurology

## 2018-10-02 ENCOUNTER — Telehealth: Payer: Self-pay | Admitting: *Deleted

## 2018-10-02 DIAGNOSIS — E119 Type 2 diabetes mellitus without complications: Secondary | ICD-10-CM | POA: Diagnosis not present

## 2018-10-02 DIAGNOSIS — H2513 Age-related nuclear cataract, bilateral: Secondary | ICD-10-CM | POA: Diagnosis not present

## 2018-10-02 DIAGNOSIS — H25013 Cortical age-related cataract, bilateral: Secondary | ICD-10-CM | POA: Diagnosis not present

## 2018-10-02 DIAGNOSIS — H04123 Dry eye syndrome of bilateral lacrimal glands: Secondary | ICD-10-CM | POA: Diagnosis not present

## 2018-10-02 DIAGNOSIS — Z7984 Long term (current) use of oral hypoglycemic drugs: Secondary | ICD-10-CM | POA: Diagnosis not present

## 2018-10-02 LAB — HM DIABETES EYE EXAM

## 2018-10-02 NOTE — Telephone Encounter (Signed)

## 2018-10-04 ENCOUNTER — Other Ambulatory Visit: Payer: Self-pay | Admitting: Internal Medicine

## 2018-10-05 ENCOUNTER — Telehealth: Payer: Self-pay | Admitting: *Deleted

## 2018-10-05 ENCOUNTER — Ambulatory Visit (INDEPENDENT_AMBULATORY_CARE_PROVIDER_SITE_OTHER): Payer: Medicare Other | Admitting: Cardiovascular Disease

## 2018-10-05 ENCOUNTER — Other Ambulatory Visit: Payer: Self-pay

## 2018-10-05 ENCOUNTER — Encounter: Payer: Self-pay | Admitting: Cardiovascular Disease

## 2018-10-05 VITALS — BP 111/69 | HR 84 | Temp 97.0°F | Ht 69.0 in | Wt 171.4 lb

## 2018-10-05 DIAGNOSIS — I495 Sick sinus syndrome: Secondary | ICD-10-CM

## 2018-10-05 DIAGNOSIS — E1142 Type 2 diabetes mellitus with diabetic polyneuropathy: Secondary | ICD-10-CM

## 2018-10-05 DIAGNOSIS — E119 Type 2 diabetes mellitus without complications: Secondary | ICD-10-CM

## 2018-10-05 DIAGNOSIS — E785 Hyperlipidemia, unspecified: Secondary | ICD-10-CM | POA: Diagnosis not present

## 2018-10-05 DIAGNOSIS — I480 Paroxysmal atrial fibrillation: Secondary | ICD-10-CM | POA: Diagnosis not present

## 2018-10-05 DIAGNOSIS — I472 Ventricular tachycardia: Secondary | ICD-10-CM | POA: Diagnosis not present

## 2018-10-05 DIAGNOSIS — Z950 Presence of cardiac pacemaker: Secondary | ICD-10-CM

## 2018-10-05 DIAGNOSIS — I4729 Other ventricular tachycardia: Secondary | ICD-10-CM

## 2018-10-05 NOTE — Progress Notes (Signed)
Patient ID: Stephen Robbins, male   DOB: Jan 24, 1936, 83 y.o.   MRN: [id number]    Cardiology Office Note    Date:  10/05/2018   ID:  Stephen Robbins, DOB [birth year], MRN [id number]  PCP:  Colon Branch, MD  Cardiologist:   Sanda Klein, MD   Chief Complaint  Patient presents with  . Atrial Fibrillation    History of Present Illness:  Stephen Robbins is a 83 y.o. male with asymptomatic paroxysmal atrial fibrillation detected on pacemaker downloads, sinus node dysfunction with chronotropic incompetence, Parkinson's disease, diabetes mellitus, asymptomatic brief nonsustained VT detected on pacemaker.  While he does not have any cardiovascular complaints, he is under emotional distress.  His wife is in the nursing facility in the apartment complex and he has not been able to see her more than twice in the last months and then only from a distance due to the coronavirus pandemic.  Her cognitive deficits are worsening.  He has also not been able to participate in his usual physical therapy for Parkinson's disease.  He had a single fall, thankfully without head impact or any serious injury.  The patient specifically denies any chest pain at rest exertion, dyspnea at rest or with exertion, orthopnea, paroxysmal nocturnal dyspnea, syncope, palpitations, focal neurological deficits, intermittent claudication, lower extremity edema, unexplained weight gain, cough, hemoptysis or wheezing.  His device is a [employer] EL L121 (not MRI conditional) dual-chamber pacemaker implanted in 2015. Device check today shows normal function. Estimated generator longevity is 12 years. There is 42% atrial pacing and only 2% ventricular pacing. Heart rate histogram distribution suggests appropriate sensor settings to compensate for his chronotropic incompetence.  He has infrequent (<1%) and asymptomatic episodes of atrial fibrillation.  He had a 2-hour episode in April, but has had several brief episodes under   Minutes in June. All with mild RVR.  All asymptomatic.  He has not had any new episodes of nonsustained ventricular tachycardia. Lead parameters are normal and battery longevity is 10 years.  He does not have any signs or symptoms of COVID 19 infection.  Past Medical History:  Diagnosis Date  . Arthritis    "joints; a little" (01/04/2014)  . Asymptomatic bilateral carotid artery stenosis    per duplex  05-15-2012  left >39%/   right 40-59%  . Basal cell carcinoma    nose  . Borderline diabetes   . BPH (benign prostatic hypertrophy) with urinary obstruction   . Bradycardia   . Bronchial pneumonia 1958  . Chronotropic incompetence with sinus node dysfunction (HCC)   . Compressed cervical disc   . Compression of lumbar vertebra (HCC)    L4 -- L5  . Dizzy   . Dysmetabolic syndrome   . Fatigue   . Frequency of urination   . GERD (gastroesophageal reflux disease)    occasional  . Hemorrhoids   . Hyperlipidemia   . Hypothyroidism   . Nocturia   . NSVT (nonsustained ventricular tachycardia) [facility])    cardiologist-  dr Courney Garrod  . Pacemaker   . Urgency of urination   . Wears glasses     Past Surgical History:  Procedure Laterality Date  . BASAL CELL CARCINOMA EXCISION     nose  . CLOSED REDUCTION NASAL FRACTURE  09-01-2007  . CYSTOSCOPY N/A 12/28/2012   Procedure: CYSTOSCOPY;  Surgeon: Bernestine Amass, MD;  Location: [facility];  Service: Urology;  Laterality: N/A;  . EXERCISE TOLERENCE TEST  12-03-2012  DR Recardo Evangelist   CHRONOTROPIC INCOMPETENCE/ NORMAL RESTING BP W/ APPROPRIATE RESPONSE/ NO CHEST PAIN/ NO ST CHANGES FROM BASELINE  . INSERT / REPLACE / REMOVE PACEMAKER  01/04/2014   [employer] model L121 serial number [id number]  . LACERATION REPAIR Right 1978   middle finger  . NEUROPLASTY / TRANSPOSITION ULNAR NERVE AT ELBOW Left 02-09-2010  . PERMANENT PACEMAKER INSERTION N/A 01/04/2014   Procedure: PERMANENT PACEMAKER INSERTION;  Surgeon: Sanda Klein, MD;  Location: [facility];  Service: Cardiovascular;  Laterality: N/A;  . SKIN BIOPSY     scc  . TONSILLECTOMY AND ADENOIDECTOMY  1944  . TRANSURETHRAL INCISION OF PROSTATE N/A 12/28/2012   Procedure: TRANSURETHRAL INCISION OF THE PROSTATE (TUIP);  Surgeon: Bernestine Amass, MD;  Location[facility];  Service: Urology;  Laterality: N/A;  . ULNAR NERVE TRANSPOSITION      Current Medications: Outpatient Medications Prior to Visit  Medication Sig Dispense Refill  . ACCU-CHEK FASTCLIX LANCETS MISC Check blood sugar three times daily 300 each 12  . bisacodyl (DULCOLAX) 5 MG EC tablet Take 5 mg daily as needed by mouth for moderate constipation.    . Blood Glucose Monitoring Suppl (ACCU-CHEK GUIDE ME) w/Device KIT 1 Device by Does not apply route as directed. 1 kit 0  . canagliflozin (INVOKANA) 300 MG TABS tablet Take 1 tablet (300 mg total) by mouth daily before breakfast. 90 tablet 1  . carbidopa-levodopa (SINEMET IR) 25-100 MG tablet TAKE 2 TABLETS BY MOUTH THREE TIMES DAILY 540 tablet 1  . gabapentin (NEURONTIN) 300 MG capsule TAKE 1 CAPSULE(300 MG) BY MOUTH TWICE DAILY 180 capsule 1  . glucose blood (ACCU-CHEK GUIDE) test strip Check blood sugar three times daily 300 each 12  . hydrocortisone (PROCTOZONE-HC) 2.5 % rectal cream Place 1 application rectally 2 (two) times daily. (Patient not taking: Reported on 05/12/2018) 30 g 5  . hydrocortisone cream 0.5 % Apply 1 application 2 (two) times daily as needed topically. rash 30 g 3  . ketoconazole (NIZORAL) 2 % cream Apply 1 application topically daily. 30 g 0  . Lancets Misc. (ACCU-CHEK FASTCLIX LANCET) KIT Check blood sugar once daily 6 kit 3  . levothyroxine (SYNTHROID) 75 MCG tablet Take 1 tablet (75 mcg total) by mouth daily before breakfast. 90 tablet 1  . metFORMIN (GLUCOPHAGE-XR) 500 MG 24 hr tablet Take 2 tablets (1,000 mg total) by mouth daily with breakfast. 180 tablet 1  . mirabegron ER (MYRBETRIQ) 25 MG TB24  tablet Take 25 mg by mouth daily.    Vladimir Faster Glycol-Propyl Glycol (SYSTANE ULTRA) 0.4-0.3 % SOLN Place 1 drop into both eyes daily as needed (dry eyes).     . polyethylene glycol powder (MIRALAX) powder Take 17 g daily by mouth. (Patient not taking: Reported on 05/12/2018) 255 g 0  . Pramoxine-HC (HYDROCORTISONE ACE-PRAMOXINE) 2.5-1 % CREA Apply rectally twice daily as needed (Patient not taking: Reported on 05/12/2018) 28.35 g 0  . sertraline (ZOLOFT) 50 MG tablet Take 1 tablet (50 mg total) by mouth daily. 90 tablet 1  . simvastatin (ZOCOR) 20 MG tablet Take 1 tablet (20 mg total) by mouth at bedtime. 90 tablet 1  . sitaGLIPtin (JANUVIA) 100 MG tablet Take 1 tablet (100 mg total) by mouth daily. 30 tablet 6  . vitamin B-12 (CYANOCOBALAMIN) 1000 MCG tablet Take 1,000 mcg by mouth every morning.     Alveda Reasons 20 MG TABS tablet TAKE 1 TABLET BY MOUTH DAILY 90 tablet 1  No facility-administered medications prior to visit.      Allergies:   Penicillins   Social History   Socioeconomic History  . Marital status: Married    Spouse name: Not on file  . Number of children: 0  . Years of education: Not on file  . Highest education level: Not on file  Occupational History  . Occupation: retired    Fish farm manager: RETIRED    Comment: executive   Social Needs  . Financial resource strain: Not on file  . Food insecurity    Worry: Not on file    Inability: Not on file  . Transportation needs    Medical: Not on file    Non-medical: Not on file  Tobacco Use  . Smoking status: Former Smoker    Packs/day: 1.00    Years: 10.00    Pack years: 10.00    Types: Cigarettes    Quit date: 03/25/1968    Years since quitting: 50.5  . Smokeless tobacco: Never Used  Substance and Sexual Activity  . Alcohol use: Yes    Alcohol/week: 7.0 standard drinks    Types: 7 Glasses of wine per week    Comment: 1 glass wine daily  . Drug use: No  . Sexual activity: Not Currently  Lifestyle  . Physical activity     Days per week: Not on file    Minutes per session: Not on file  . Stress: Not on file  Relationships  . Social Herbalist on phone: Not on file    Gets together: Not on file    Attends religious service: Not on file    Active member of club or organization: Not on file    Attends meetings of clubs or organizations: Not on file    Relationship status: Not on file  Other Topics Concern  . Not on file  Social History Narrative   Lives at [place]     Family History:  The patient's family history includes Lung cancer in his mother; Stroke in his father.   ROS:   Please see the history of present illness.    ROS all other systems are reviewed and negative  PHYSICAL EXAM:   VS:  BP 111/69   Pulse 84   Temp (!) 97 F (36.1 C)   Ht 5' 9"  (1.753 m)   Wt 171 lb 6.4 oz (77.7 kg)   SpO2 96%   BMI 25.31 kg/m     General: Alert, oriented x3, no distress, healthy R subclavian pacemaker site Head: no evidence of trauma, PERRL, EOMI, no exophtalmos or lid lag, no myxedema, no xanthelasma; normal ears, nose and oropharynx Neck: normal jugular venous pulsations and no hepatojugular reflux; brisk carotid pulses without delay and no carotid bruits Chest: clear to auscultation, no signs of consolidation by percussion or palpation, normal fremitus, symmetrical and full respiratory excursions Cardiovascular: normal position and quality of the apical impulse, regular rhythm, normal first and second heart sounds, no murmurs, rubs or gallops Abdomen: no tenderness or distention, no masses by palpation, no abnormal pulsatility or arterial bruits, normal bowel sounds, no hepatosplenomegaly Extremities: no clubbing, cyanosis or edema; 2+ radial, ulnar and brachial pulses bilaterally; 2+ right femoral, posterior tibial and dorsalis pedis pulses; 2+ left femoral, posterior tibial and dorsalis pedis pulses; no subclavian or femoral bruits Neurological: grossly nonfocal Psych: Normal mood and  affect   Wt Readings from Last 3 Encounters:  09/15/18 172 lb 6 oz (78.2 kg)  05/12/18 171  lb (77.6 kg)  04/09/18 169 lb (76.7 kg)      Studies/Labs Reviewed:   EKG:  EKG is ordered today.  It shows A paced, V sensed rhythm, RBBB+LAFB., QTc 456 ms. Recent Labs: 02/06/2018: Hemoglobin 14.8; Platelets 197 05/12/2018: ALT 9; BUN 17; Creatinine, Ser 0.80; Potassium 3.9; Sodium 136 09/15/2018: TSH 1.75   Lipid Panel    Component Value Date/Time   CHOL 102 02/06/2018 1518   TRIG 109 02/06/2018 1518   TRIG 83 02/20/2006 0838   HDL 34 (L) 02/06/2018 1518   CHOLHDL 3.0 02/06/2018 1518   VLDL 25.6 02/04/2017 1207   LDLCALC 49 02/06/2018 1518     ASSESSMENT:    1. Paroxysmal atrial fibrillation (HCC)   2. Pacemaker      PLAN:  In order of problems listed above:  1. Atrial fibrillation: Low prevalence, mild RVR, asymptomatic. On anticoagulation. CHADSVasc score 3 (age, DM).  Antiarrhythmics are not justified 2. SSS: Concerned that he is more sedentary, his heart rate histogram appears appropriate.  No symptoms of chronotropic incompetence..  3. NSVT: None recorded since his last device check 4. PPM: Continue remote downloads every 3 months and yearly office visits. 5. Orthostatic hypotension: Related to peripheral neuropathy, Parkinson's disease, use of SGLT2 inhibitor, etc.  Has not been a prominent complaint at this visit, his fall did not sound like it was due to hypotension, but rather loss of balance . 6. Xarelto: No overt bleeding complications. 7. HLP: Excellent lipid profile.  8. DM: Good control.   Medication Adjustments/Labs and Tests Ordered: Current medicines are reviewed at length with the patient today.  Concerns regarding medicines are outlined above.  Medication changes, Labs and Tests ordered today are listed in the Patient Instructions below. There are no Patient Instructions on file for this visit.   Signed, Sanda Klein, MD  10/05/2018 12:15 PM    [address], [place], [place]  [address] Phone: [phone]; Fax: [phone]

## 2018-10-05 NOTE — Patient Instructions (Signed)
Medication Instructions:  No changes If you need a refill on your cardiac medications before your next appointment, please call your pharmacy.   Lab work: None ordered If you have labs (blood work) drawn today and your tests are completely normal, you will receive your results only by: Marland Kitchen MyChart Message (if you have MyChart) OR . A paper copy in the mail If you have any lab test that is abnormal or we need to change your treatment, we will call you to review the results.  Testing/Procedures: None ordered  Follow-Up: At [facility], you and your health needs are our priority.  As part of our continuing mission to provide you with exceptional heart care, we have created designated Provider Care Teams.  These Care Teams include your primary Cardiologist (physician) and Advanced Practice Providers (APPs -  Physician Assistants and Nurse Practitioners) who all work together to provide you with the care you need, when you need it. You will need a follow up appointment in 12 months.  Please call our office 2 months in advance to schedule this appointment.  You may see Sanda Klein, MD or one of the following Advanced Practice Providers on your designated Care Team: [place], [place] . Fabian Sharp, PA-C

## 2018-10-05 NOTE — Telephone Encounter (Signed)
Left a message with the patient concerning his appointment today at 12 to see if he was going to make it since he is late.

## 2018-10-07 ENCOUNTER — Telehealth: Payer: Medicare Other | Admitting: Neurology

## 2018-10-13 ENCOUNTER — Encounter: Payer: Self-pay | Admitting: Internal Medicine

## 2018-10-14 ENCOUNTER — Encounter: Payer: Self-pay | Admitting: Internal Medicine

## 2018-10-15 ENCOUNTER — Ambulatory Visit (INDEPENDENT_AMBULATORY_CARE_PROVIDER_SITE_OTHER): Payer: Medicare Other | Admitting: *Deleted

## 2018-10-15 DIAGNOSIS — I495 Sick sinus syndrome: Secondary | ICD-10-CM

## 2018-10-15 DIAGNOSIS — I480 Paroxysmal atrial fibrillation: Secondary | ICD-10-CM

## 2018-10-15 LAB — CUP PACEART REMOTE DEVICE CHECK
Battery Remaining Longevity: 102 mo
Battery Remaining Percentage: 97 %
Brady Statistic RA Percent Paced: 32 %
Brady Statistic RV Percent Paced: 1 %
Date Time Interrogation Session: 20200723121600
Implantable Lead Implant Date: 20151013
Implantable Lead Implant Date: 20151013
Implantable Lead Location: 753859
Implantable Lead Location: 753860
Implantable Lead Model: 4135
Implantable Lead Model: 4136
Implantable Lead Serial Number: 29480373
Implantable Lead Serial Number: 29608302
Implantable Pulse Generator Implant Date: 20151013
Lead Channel Impedance Value: 518 Ohm
Lead Channel Impedance Value: 608 Ohm
Lead Channel Setting Pacing Amplitude: 2 V
Lead Channel Setting Pacing Amplitude: 4 V
Lead Channel Setting Pacing Pulse Width: 1 ms
Lead Channel Setting Sensing Sensitivity: 2.5 mV
Pulse Gen Serial Number: 702951

## 2018-10-17 ENCOUNTER — Other Ambulatory Visit: Payer: Self-pay | Admitting: Internal Medicine

## 2018-10-19 ENCOUNTER — Telehealth: Payer: Self-pay

## 2018-10-19 NOTE — Telephone Encounter (Signed)
We can try  - metformin XR 1000 mg:  ONE tablet twice a day Or  - plain metformin 1000 mg twice a day.  Before you do that, please asked patient if he is able to swallow large pills

## 2018-10-19 NOTE — Telephone Encounter (Signed)
PA approved.

## 2018-10-19 NOTE — Telephone Encounter (Signed)
Metformin ER 500mg  on recall. Please advise.

## 2018-10-19 NOTE — Telephone Encounter (Signed)
Tried calling [name] no answer. Mychart message sent to Pt.

## 2018-10-19 NOTE — Telephone Encounter (Signed)
PA initiated via Covermymeds; KEY: [id number]. Awaiting determination.

## 2018-10-20 MED ORDER — METFORMIN HCL 1000 MG PO TABS: 1000.0000 mg | ORAL_TABLET | Freq: Two times a day (BID) | ORAL | 1 refills | Status: DC

## 2018-10-20 NOTE — Telephone Encounter (Signed)
Metformin XR 1000mg  very expensive; Per PCP try plain metformin 1000mg  bid. Mychart message sent and Rx sent.

## 2018-10-26 MED ORDER — FARXIGA 10 MG PO TABS: 10.0000 mg | ORAL_TABLET | Freq: Every day | ORAL | 6 refills | Status: DC

## 2018-10-26 NOTE — Telephone Encounter (Signed)
Stating this medication need a prior auth and it hasn't been approved. Please advise.

## 2018-10-26 NOTE — Telephone Encounter (Signed)
Approval letter faxed to Jenny Reichmann at [facility].

## 2018-10-26 NOTE — Telephone Encounter (Signed)
Farxiga 10mg  sent to [facility]. MyChart message sent to Pt to inform of med change.

## 2018-10-26 NOTE — Telephone Encounter (Signed)
Called back concerning the approval on this med. I advised pt it was.

## 2018-10-26 NOTE — Telephone Encounter (Signed)
Okay to switch to farxiga 10 mg 1 p.o. daily. Please be sure the patient knows we are  switching  Continue watching CBGs

## 2018-10-26 NOTE — Telephone Encounter (Signed)
John w/[facility] [phone] stated he called the patient's insurance company [employer] and they said they don't have a PA approval on file.  Please advise.

## 2018-10-26 NOTE — Addendum Note (Signed)
Addended byDamita Dunnings D on: 10/26/2018 04:29 PM   Modules accepted: Orders

## 2018-10-26 NOTE — Telephone Encounter (Signed)
Per pharmacy Invokana not approved despite PA approval letter (?). Preferred alternatives; [place].

## 2018-10-27 NOTE — Progress Notes (Signed)
Remote pacemaker transmission.   

## 2018-11-04 ENCOUNTER — Encounter: Payer: Self-pay | Admitting: Internal Medicine

## 2018-11-05 ENCOUNTER — Other Ambulatory Visit: Payer: Self-pay | Admitting: Internal Medicine

## 2018-11-05 MED ORDER — SERTRALINE HCL 50 MG PO TABS: 100.0000 mg | ORAL_TABLET | Freq: Every day | ORAL | 0 refills | Status: DC

## 2018-11-08 ENCOUNTER — Encounter: Payer: Self-pay | Admitting: Internal Medicine

## 2018-11-08 DIAGNOSIS — R1310 Dysphagia, unspecified: Secondary | ICD-10-CM

## 2018-11-08 DIAGNOSIS — G20 Parkinson's disease: Secondary | ICD-10-CM

## 2018-11-10 ENCOUNTER — Telehealth: Payer: Self-pay

## 2018-11-10 DIAGNOSIS — R1310 Dysphagia, unspecified: Secondary | ICD-10-CM

## 2018-11-10 NOTE — Telephone Encounter (Signed)
-----   Message from [place], DO sent at 11/10/2018  3:35 PM EDT ----- Please send new order for MBE to [facility].  Pt didn't want it done at [facility]

## 2018-11-10 NOTE — Telephone Encounter (Signed)
Orders placed Fax to [facility] for scheduling

## 2018-11-17 NOTE — Telephone Encounter (Addendum)
Yes the order was fax over to [facility] after you sign it. They wouldn't accept a unsign order. I have the fax confirmation that the fax went successfully  Pt can call them to schedule. Will send contact number via mychart.

## 2018-11-20 ENCOUNTER — Other Ambulatory Visit: Payer: Self-pay

## 2018-11-20 ENCOUNTER — Ambulatory Visit: Payer: Medicare Other | Admitting: Neurology

## 2018-11-20 NOTE — Telephone Encounter (Signed)
Requested Prescriptions   Pending Prescriptions Disp Refills  . carbidopa-levodopa (SINEMET IR) 25-100 MG tablet 540 tablet 1    Sig: Take 2 tablets by mouth 3 (three) times daily.   Rx last filled: 05/07/18 #540 1 refills  Pt last seen:04/09/18  Follow up appt scheduled: 11/24/18   Will wait until patient is seen in office

## 2018-11-20 NOTE — Progress Notes (Signed)
Stephen Robbins was seen today in f/u in the movement clinic.  He was dx with PD last visit, on 06/17/13.  He is on carbidopa/levodopa 25/100 tid.  He has no side effects with the carbidopa/levodopa, but states that "I just don't know what to expect."  He continues to have tremor, which can be bothersome for him.  He has had no falls.  No lightheadedness.  No near syncope.  No hallucinations.  He had a carotid ultrasound done since last visit.  There is 1-39% stenosis bilaterally.  He did have some lab work done since last visit.  His hemoglobin A1c has greatly improved.  It is 6.3 (was 7.9 and prior to that was 9.4). He just finished PT and is enrolled in the exercise class now.  He presents today with a long list of questions.  He again tape records our session.    11/19/13 update:  Pt is f/u regarding Parkinson's disease.  He is currently on carbidopa/levodopa 25/100, one tablet 3 times per day. He is accompanied by his wife, who supplements the history.   Patient denies any falls.  He denies hallucinations.  Denies nausea or vomiting.  No lightheadedness or near syncope.  He is exercising.  He still has tremor and isn't sure that the levodopa helps tremor.  He sent me several e-mails since last visit.  One of these was just a TV news link regarding [facility] high-resolution imaging and deep brain stimulation.  He subsequently sent another e-mail regarding focused ultrasound.  Asks about updates regarding these and has 2 pages of questions.  Asks about why we can't give him human growth hormone or steroids for PD.  Overall, patient is clinically doing well.   02/21/14 update:   Pt returns for f/u, accompanied by his wife who supplements the history.  The records that were made available to me were reviewed.  Pt has had PPM since last visit due to bradycardia.  He has been told not to exercise until 12/9.  He will start with PWR moves classes after the first of the year as well as circuit 2 classes.  He is still  on carbidopa/levodopa 25/100 at 8-9am/12-1pm/6-7 pm.  He is c/o paresthesias/"neuropathy" in the R foot.  He only notices it with driving.   He has no back pain.  He has no sensation of balance loss with closing eyes in the shower.  He thinks that exposure to levodopa causes the neuropathy and brings with him a small study (58 pts) that suggests that levodopa could be an etiology for the neuropathy.  Notices lack of smell.  No falls.  Rare lightheadedness. No hallucinations.    05/23/14 update:  Pt is f/u today, accompanied by his wife who supplements the history.  He went to [facility] since our last visit and I reviewed those records.  He was told he had PN (known diagnosis) but no new meds were prescribed.  He asks about a "cure" for PN that a doctor in [place] was advertising.   He remains on carbidopa/levodopa 25/100 tid for the PD.  He is exercising faithfully.  He has trouble getting himself over 3 miles per hour when walking and he is concerned about that.  He asks again about taking steroids and human growth hormone.  He also asks about taking protein supplements.  He has d/c the glucosamine for joint pain/arthritis as he didn't think that it was helpful.    09/08/14 update:  The  patient is following up today.  He is accompanied by his wife who supplements the history.   Records were reviewed since last visit, from his primary care physician, podiatrist, as well as his cardiologist.  He remains on carbidopa/levodopa 25/100, one tablet 3 times per day.  He notices no wearing off.  He is doing PD circuit class.  He called his podiatrist since last visit and asked him about medications for neuropathy and he deferred to me.  We started gabapentin on May 30, 2014.  He is on 300 mg twice a day.  He states that his feet numbness is better.  He has continued to complain about fatigue.  His pacemaker was recently adjusted.  He is trying to walk/exercise at the park and his endurance has been better ever  since.  He again asks about energy.  He is back on testosterone for that.    01/12/15 update:  The patient returns today for follow-up, accompanied by his wife who supplements the history.  He remains on carbidopa/levodopa 25/100, one tablet 3 times per day.  Overall, the patient states that he is doing well.   Still has some intermittent tremor.  He denies any syncope.  He has had no falls since last visit.  He has had some near falls.  No hallucinations or visual distortions.  He is on gabapentin, 300 mg twice a day for peripheral neuropathy secondary to diabetes mellitus. Pt states that it is helping with the sx's, along with the "natural herb that I take."   He continues to faithfully exercise with the PWR classes.  Some lightheaded after exercise.  No new medical issues since last visit.    04/18/15 update:  The patient presents today, accompanied by his wife who supplements the history.  Last visit, increased the patients carbidopa/levodopa 25/100, so that he is now taking 1-1/2 tablets 3 times per day.   When asked how he feel, he states, "I don't know how I am supposed to feel."  He denies stiffness.  He has minimal thumb tremor.   He is supposed to be on gabapentin, 300 mg twice a day for peripheral neuropathy but went down to q day because his feet weren't bothering him.  I have reviewed records made available to me since last visit.  He was diagnosed with atrial fibrillation and started on Xarelto.  He is doing well with that.  He denies any bleeding difficulties.  No falls.  No near syncope.  Rarely does he even feel unstable.  He is exercising.  Goes to the gym three times a week.    08/22/15 update:  The patient presents today, accompanied by his wife who supplements the history. He is on carbidopa/levodopa 25/100, 1-1/2 tablets 3 times per day.  He remains on gabapentin, 300 mg once a day for peripheral neuropathy (7-9pm). He states that he doesn't think that he can come off of it, at least until  he moves.  He did come to the PD support group lecture that I gave 2 weeks ago.  He denies hallucinations, lightheadedness, near syncope.  He does state that they are moving to [place] in assisted living.  States that there has been some stress with moving, as he is having to fix a hole that was discovered in the bathroom floor.   Asks me about alpha-lipoic acid and states that the people who sell it to him "think that it is good."    12/26/15 update:  The patient follows up today,  accompanied by his wife who supplements the history.  He remains on carbidopa/levodopa 25/100, 1-1/2 tablets 3 times per day.  He is still on gabapentin, 300 mg at night for neuropathy.  States that he doesn't want to try and d/c that.    He has since moved to [place] and they are adjusting well.  They have sold their house.  He is doing a [employer] class.  No falls.  Some near falls.    04/29/16 update:  Patient follows up today, accompanied by his wife who supplements the history.  Patient is on carbidopa/levodopa 25/100, 1.5 tablets 3 times per day (8am/noon/5pm).  Pt denies falls.  Pt denies lightheadedness, near syncope.  No hallucinations.  Mood has been "up and down."  Occasional bouts of constipation vs diarrhea.   On gabapentin 300 q hs for PN.  Did OT/ST since our last visit and reviewed those notes.  Doing PWR moves classes on wednesdays.    09/03/16 update:  Patient seen today in follow-up, accompanied by his wife who supplements the history.  Patient remains on carbidopa/levodopa 25/100 25/100, 1-1/2 tablets at 8 AM/noon/5 PM.  He has had no falls.  No lightheadedness or near syncope.  No hallucinations.  Remains on gabapentin, but now on 300 mg bid.  Seems to be doing better with the neuropathic sx's now.  The records that were made available to me were reviewed.  Metformin started by pcp as HgBA1C was rising.  Has some constipation.  He pulled me to the side and states that he is slightly more depressed dealing with  his wifes alzheimers.  No SI/HI.    02/07/17 update: Patient is seen today in follow-up.  He is accompanied by his wife who supplements the history.  Patient is on carbidopa/levodopa 25/100, 1-1/2 tablets 3 times per day.  Patient has had no falls since last visit.  No hallucinations.  He is on gabapentin, 300 mg twice per day for paresthesias.  This seems to be helping and is well controlled and he doesn't even always take the evening gabapentin dosage.  He has had no lightheadedness or near syncope.  He continues to have some stress associated with caregiving for his wife who has dementia.  Records have been reviewed since last visit.  He was in the emergency room twice since last visit, once for constipation and once for low back pain.  When asked how much water he was drinking he stated "obviously not enough."  He has not tried the rancho recipe and doesn't remember that I gave it to him.  Asks for another copy.  He had labwork few days ago and reports that his thyroid medication was adjusted to account for high TSH.   06/17/17 update: Patient is seen today in follow-up.  The patient is accompanied by his wife who supplements the history.  The patient is doing well on carbidopa/levodopa 25/100, 1.5 tablets 3 times per day.  No falls, no hallucinations.  No lightheadedness or near syncope.  Records have been reviewed since our last visit.  He went to the [facility] support group but is somewhat frustrated at the time of it.  He is following rancho recipe and uses some miralax.  States that he drinks about 30 oz per day of water.  Still with some constipation.  Asks about special diets for PD.    11/06/17 update: Patient is seen today in follow-up for Parkinson's disease.  He is accompanied by his wife who supplements the history.  is on carbidopa/levodopa 25/100, 1.5 tablets 3 times per day.  Recommended speech therapy last visit, but he wanted to go in [facility] and if there were no [facility] loud  certified therapist (which he had desired).  He has had no falls since last visit.  Uses cane prn (after first awakens).  No lightheadedness or near syncope except a little lightheaded when he finishes exercise.  Does PWR Moves class in [place].  No hallucinations.  I have reviewed records since our last visit.  He saw cardiology on October 07, 2017.  No changes were made to medications.  He is transferring care to Dr. [name] as that is a closer location for him.  He has an appointment on August 27 with him.  Last dermatology visit: late spring, 2019.  C/o constipation.    04/09/18 update: Patient is seen today in follow-up for Parkinson's disease. Patient was on carbidopa/levodopa 25/100, which was slightly increased last visit to 2 tablets 3 times per day.  Doing well with this.  No hallucinations.  No lightheadedness except in the early AM (and he uses a cane then) or or near syncope.  No falls but had some near falls.  No sleep attacks.  Records are reviewed since last visit.  Last saw primary care on February 06, 2018.  wife fx foot in September and that has been the biggest stress as she had to be in the hospital for a week.  That made her dementia worse and she still isn't able to walk well. She is now in the long term care aspect which has been hard on him.  His mood has "not been well."  "I'm not contemplating destruction and I'm not drinking."  Is on sertraline, 50 mg daily.  States that it was just increased about 4 weeks ago and he doesn't want to increase further right now.  He thinks that the increase helped.  Speech has been softer but hasn't had time to do ST.  Not able to exercise right now.    11/24/18 update: Patient seen today in follow-up for Parkinson's disease.  He is on carbidopa/levodopa 25/100, 2 tablets 3 times per day.  He is still on gabapentin, 300 mg, 1 to 2 tablets/day for diabetic neuropathy.  Patient reports that this does seem to help.  Medical records have been reviewed since last  visit, including primary care records and cardiology records.  Recently, patient emailed me about a scary incident that occurred at dinner.  He was eating and began to choke on a potato.  A friend near him did the Heimlich maneuver.  Because of that, we did end up ordering a modified barium swallow.  He wanted this done at [facility] and that is pending.  Pt states this is scheduled for 9/15.   He did email his primary care physician as well.  He told him if that was negative, he would refer him to the gastroenterologist.  Stress associated with his wife being in an Alzheimer's unit continues to be an issue.  He has not been able to see his wife since March.  he is able to talk with his wife daily.   He is on sertraline, 50 mg daily.  His primary care is managing that.  He has not been exercising "like I should."  He is using his cane when he walks for exercise.  He gets tired at the end of a walk.  One fall since our last visit.  Was crossing a street  after a long walk and was tired and fell.  No other falls.  "my balance is not what it used to be."  Feels memory is good.  Able to drive and feels "confident" about those.   Asks about ST - voice is weaker.  PREVIOUS MEDICATIONS: none to date  ALLERGIES:   Allergies  Allergen Reactions   Penicillins Other (See Comments)    Unknown childhood reaction    CURRENT MEDICATIONS:  Current Outpatient Medications on File Prior to Visit  Medication Sig Dispense Refill   ACCU-CHEK FASTCLIX LANCETS MISC Check blood sugar three times daily 300 each 12   bisacodyl (DULCOLAX) 5 MG EC tablet Take 5 mg daily as needed by mouth for moderate constipation.     Blood Glucose Monitoring Suppl (ACCU-CHEK GUIDE ME) w/Device KIT 1 Device by Does not apply route as directed. 1 kit 0   carbidopa-levodopa (SINEMET IR) 25-100 MG tablet TAKE 2 TABLETS BY MOUTH THREE TIMES DAILY 540 tablet 1   dapagliflozin propanediol (FARXIGA) 10 MG TABS tablet Take 10 mg by mouth daily.  30 tablet 6   gabapentin (NEURONTIN) 300 MG capsule TAKE 1 CAPSULE(300 MG) BY MOUTH TWICE DAILY 180 capsule 1   glucose blood (ACCU-CHEK GUIDE) test strip Check blood sugar three times daily 300 each 12   hydrocortisone (PROCTOZONE-HC) 2.5 % rectal cream Place 1 application rectally 2 (two) times daily. (Patient not taking: Reported on 05/12/2018) 30 g 5   hydrocortisone cream 0.5 % Apply 1 application 2 (two) times daily as needed topically. rash 30 g 3   ketoconazole (NIZORAL) 2 % cream Apply 1 application topically daily. 30 g 0   levothyroxine (SYNTHROID) 75 MCG tablet Take 1 tablet (75 mcg total) by mouth daily before breakfast. 90 tablet 1   metFORMIN (GLUCOPHAGE) 1000 MG tablet Take 1 tablet (1,000 mg total) by mouth 2 (two) times daily with a meal. 180 tablet 1   mirabegron ER (MYRBETRIQ) 25 MG TB24 tablet Take 25 mg by mouth daily.     Polyethyl Glycol-Propyl Glycol (SYSTANE ULTRA) 0.4-0.3 % SOLN Place 1 drop into both eyes daily as needed (dry eyes).      polyethylene glycol powder (MIRALAX) powder Take 17 g daily by mouth. (Patient not taking: Reported on 05/12/2018) 255 g 0   Pramoxine-HC (HYDROCORTISONE ACE-PRAMOXINE) 2.5-1 % CREA Apply rectally twice daily as needed (Patient not taking: Reported on 05/12/2018) 28.35 g 0   sertraline (ZOLOFT) 50 MG tablet Take 2 tablets (100 mg total) by mouth daily. 180 tablet 0   simvastatin (ZOCOR) 20 MG tablet Take 1 tablet (20 mg total) by mouth at bedtime. 90 tablet 1   sitaGLIPtin (JANUVIA) 100 MG tablet Take 1 tablet (100 mg total) by mouth daily. 30 tablet 6   vitamin B-12 (CYANOCOBALAMIN) 1000 MCG tablet Take 1,000 mcg by mouth every morning.      XARELTO 20 MG TABS tablet TAKE 1 TABLET BY MOUTH DAILY 90 tablet 1   No current facility-administered medications on file prior to visit.     PAST MEDICAL HISTORY:   Past Medical History:  Diagnosis Date   Arthritis    "joints; a little" (01/04/2014)   Asymptomatic bilateral  carotid artery stenosis    per duplex  05-15-2012  left >39%/   right 40-59%   Basal cell carcinoma    nose   Borderline diabetes    BPH (benign prostatic hypertrophy) with urinary obstruction    Bradycardia    Bronchial pneumonia 1958  Chronotropic incompetence with sinus node dysfunction (HCC)    Compressed cervical disc    Compression of lumbar vertebra (HCC)    L4 -- L5   Dizzy    Dysmetabolic syndrome    Fatigue    Frequency of urination    GERD (gastroesophageal reflux disease)    occasional   Hemorrhoids    Hyperlipidemia    Hypothyroidism    Nocturia    NSVT (nonsustained ventricular tachycardia) [facility])    cardiologist-  dr croitoru   Pacemaker    Urgency of urination    Wears glasses     PAST SURGICAL HISTORY:   Past Surgical History:  Procedure Laterality Date   BASAL CELL CARCINOMA EXCISION     nose   CLOSED REDUCTION NASAL FRACTURE  09-01-2007   CYSTOSCOPY N/A 12/28/2012   Procedure: CYSTOSCOPY;  Surgeon: Bernestine Amass, MD;  [facility];  Service: Urology;  Laterality: N/A;   EXERCISE TOLERENCE TEST  12-03-2012  DR CROITURO   CHRONOTROPIC INCOMPETENCE/ NORMAL RESTING BP W/ APPROPRIATE RESPONSE/ NO CHEST PAIN/ NO ST CHANGES FROM BASELINE   INSERT / REPLACE / REMOVE PACEMAKER  01/04/2014   [employer] model L121 serial number [id number]   LACERATION REPAIR Right 1978   middle finger   NEUROPLASTY / TRANSPOSITION ULNAR NERVE AT ELBOW Left 02-09-2010   PERMANENT PACEMAKER INSERTION N/A 01/04/2014   Procedure: PERMANENT PACEMAKER INSERTION;  Surgeon: Sanda Klein, MD;  Location: [facility];  Service: Cardiovascular;  Laterality: N/A;   SKIN BIOPSY     scc   TONSILLECTOMY AND ADENOIDECTOMY  1944   TRANSURETHRAL INCISION OF PROSTATE N/A 12/28/2012   Procedure: TRANSURETHRAL INCISION OF THE PROSTATE (TUIP);  Surgeon: Bernestine Amass, MD;  Location: [facility];  Service: Urology;   Laterality: N/A;   ULNAR NERVE TRANSPOSITION      SOCIAL HISTORY:   Social History   Socioeconomic History   Marital status: Married    Spouse name: Not on file   Number of children: 0   Years of education: Not on file   Highest education level: Not on file  Occupational History   Occupation: retired    Fish farm manager: RETIRED    Comment: executive   Social Designer, fashion/clothing strain: Not on file   Food insecurity    Worry: Not on file    Inability: Not on file   Transportation needs    Medical: Not on file    Non-medical: Not on file  Tobacco Use   Smoking status: Former Smoker    Packs/day: 1.00    Years: 10.00    Pack years: 10.00    Types: Cigarettes    Quit date: 03/25/1968    Years since quitting: 50.6   Smokeless tobacco: Never Used  Substance and Sexual Activity   Alcohol use: Yes    Alcohol/week: 7.0 standard drinks    Types: 7 Glasses of wine per week    Comment: 1 glass wine daily   Drug use: No   Sexual activity: Not Currently  Lifestyle   Physical activity    Days per week: Not on file    Minutes per session: Not on file   Stress: Not on file  Relationships   Social connections    Talks on phone: Not on file    Gets together: Not on file    Attends religious service: Not on file    Active member of club or organization: Not on  file    Attends meetings of clubs or organizations: Not on file    Relationship status: Not on file   Intimate partner violence    Fear of current or ex partner: Not on file    Emotionally abused: Not on file    Physically abused: Not on file    Forced sexual activity: Not on file  Other Topics Concern   Not on file  Social History Narrative   Lives at [place]:   Family Status  Relation Name Status   Father  Deceased       stroke   Mother  Deceased       lung cancer   Brother  Alive       healthy    ROS:    Review of Systems  Constitutional: Negative.   Eyes:  Negative.   Cardiovascular: Negative.   Gastrointestinal: Negative.   Genitourinary: Negative.   Skin: Negative.   Psychiatric/Behavioral: Positive for depression.   ]  PHYSICAL EXAMINATION:    VITALS:   There were no vitals filed for this visit. Wt Readings from Last 3 Encounters:  10/05/18 171 lb 6.4 oz (77.7 kg)  09/15/18 172 lb 6 oz (78.2 kg)  05/12/18 171 lb (77.6 kg)    GEN:  The patient appears stated age and is in NAD. HEENT:  Normocephalic, atraumatic.  The mucous membranes are moist. The superficial temporal arteries are without ropiness or tenderness. CV:  RRR Lungs:  CTAB Neck/HEME:  There are no carotid bruits bilaterally.  Neurological examination:  Orientation: The patient is alert and oriented x3. Cranial nerves: There is good facial symmetry. The speech is fluent and clear. Soft palate rises symmetrically and there is no tongue deviation. Hearing is intact to conversational tone. Sensation: Sensation is intact to light touch throughout Motor: Strength is 5/5 in the bilateral upper and lower extremities.   Shoulder shrug is equal and symmetric.  There is no pronator drift.   Movement examination: Tone: There is minor increased tone in the bilateral UE Abnormal movements: There is mild dyskinesia in the R shoulder and in the R leg Coordination:  There is mild decremation with finger taps on the L and toe taps bilaterally Gait and Station: The patient has no difficulty arising out of a deep-seated chair without the use of the hands.  Few short steps at first.  Stooped at the waist.  Good arm swing.  Does well with ambulation.  Mild unsteady in the turn.    LABS:  Lab Results  Component Value Date   TSH 1.75 09/15/2018   Lab Results  Component Value Date   VITAMINB12 872 06/17/2013     Chemistry      Component Value Date/Time   NA 136 05/12/2018 1443   K 3.9 05/12/2018 1443   CL 101 05/12/2018 1443   CO2 27 05/12/2018 1443   BUN 17 05/12/2018 1443     CREATININE 0.80 05/12/2018 1443   CREATININE 0.77 12/29/2013 1627      Component Value Date/Time   CALCIUM 9.5 05/12/2018 1443   ALKPHOS 39 02/04/2017 1207   AST 21 05/12/2018 1443   ALT 9 05/12/2018 1443   BILITOT 0.9 02/04/2017 1207     RPR negative Lab Results  Component Value Date   FOLATE >20.0 06/17/2013     Lab Results  Component Value Date   HGBA1C 6.8 (H) 09/15/2018    ASSESSMENT/PLAN:  1. idiopathic Parkinson's disease.  I see  no atypical features.    -continue carbidopa/levodopa 25/100, 2 po tid.   Discussed timing of medication.   Risks, benefits, side effects and alternative therapies were discussed.  The opportunity to ask questions was given and they were answered to the best of my ability.  The patient expressed understanding and willingness to follow the outlined treatment protocols.  -has dermatology appt next week.     -pt has mild dyskinesia.  Doesn't notice it but he and I discussed pathophysiology with it.    -refer to [facility] for PT/ST.  Sent referral and gave pt handwritten copy. 2.  Peripheral neuropathy  -There is evidence of this on examination.  Likely from DM.  His workup for reversible causes was negative.  His diabetes is under much better control than in the past.  -currently on gabapentin - 1 -2 times per day.  He does wish to continue given paresthesias in the feet. 3.  Hx of transaminasemia  -These were elevated in 2012 but have improved 4.  Constipation  -doesn't want to add miralax/colace.   5.  Depression   -associated with wife/caregiving.    -he just had his sertraline increased to 50 mg daily. He and I discussed that this can be increased further.   Risks, benefits, side effects and alternative therapies were discussed.  The opportunity to ask questions was given and they were answered to the best of my ability.  The patient expressed understanding and willingness to follow the outlined treatment protocols.  -discussed  counseling with the patient 6.  Follow up is anticipated in the next 4-6 months, sooner should new neurologic issues arise.  Much greater than 50% of this visit was spent in counseling and coordinating care.  Total face to face time:  30 min

## 2018-11-24 ENCOUNTER — Other Ambulatory Visit: Payer: Self-pay

## 2018-11-24 ENCOUNTER — Encounter: Payer: Self-pay | Admitting: Neurology

## 2018-11-24 ENCOUNTER — Ambulatory Visit (INDEPENDENT_AMBULATORY_CARE_PROVIDER_SITE_OTHER): Payer: Medicare Other | Admitting: Neurology

## 2018-11-24 VITALS — BP 125/72 | HR 71 | Ht 69.0 in | Wt 169.8 lb

## 2018-11-24 DIAGNOSIS — F330 Major depressive disorder, recurrent, mild: Secondary | ICD-10-CM | POA: Diagnosis not present

## 2018-11-24 DIAGNOSIS — G249 Dystonia, unspecified: Secondary | ICD-10-CM | POA: Diagnosis not present

## 2018-11-24 DIAGNOSIS — G20 Parkinson's disease: Secondary | ICD-10-CM | POA: Diagnosis not present

## 2018-11-24 MED ORDER — CARBIDOPA-LEVODOPA 25-100 MG PO TABS: 2.0000 | ORAL_TABLET | Freq: Three times a day (TID) | ORAL | 1 refills | Status: DC

## 2018-11-24 NOTE — Patient Instructions (Addendum)
Good to see you today!  We discussed your diagnosis of Parkinsons disease.  We are not changing your medication today.  Take your carbidopa/levodopa, 2 tablets at 7am/11am/4pm

## 2018-11-24 NOTE — Progress Notes (Addendum)
[phone] [facility] PT/ST  Fax: [phone]

## 2018-12-02 DIAGNOSIS — L570 Actinic keratosis: Secondary | ICD-10-CM | POA: Diagnosis not present

## 2018-12-02 DIAGNOSIS — L218 Other seborrheic dermatitis: Secondary | ICD-10-CM | POA: Diagnosis not present

## 2018-12-02 DIAGNOSIS — Z85828 Personal history of other malignant neoplasm of skin: Secondary | ICD-10-CM | POA: Diagnosis not present

## 2018-12-02 DIAGNOSIS — D1801 Hemangioma of skin and subcutaneous tissue: Secondary | ICD-10-CM | POA: Diagnosis not present

## 2018-12-02 DIAGNOSIS — L821 Other seborrheic keratosis: Secondary | ICD-10-CM | POA: Diagnosis not present

## 2018-12-08 ENCOUNTER — Other Ambulatory Visit: Payer: Self-pay

## 2018-12-08 DIAGNOSIS — R1313 Dysphagia, pharyngeal phase: Secondary | ICD-10-CM | POA: Diagnosis not present

## 2018-12-08 DIAGNOSIS — G20 Parkinson's disease: Secondary | ICD-10-CM | POA: Diagnosis not present

## 2018-12-08 MED ORDER — GABAPENTIN 300 MG PO CAPS: ORAL_CAPSULE | ORAL | 1 refills | Status: DC

## 2018-12-08 NOTE — Telephone Encounter (Signed)
Requested Prescriptions   Pending Prescriptions Disp Refills  . gabapentin (NEURONTIN) 300 MG capsule 180 capsule 1    Sig: TAKE 1 CAPSULE(300 MG) BY MOUTH TWICE DAILY   Rx last filled: 06/11/18 #180 1 REFILLS Pt last seen: 11/24/18  Follow up appt scheduled: 05/04/19   APPROVED

## 2018-12-09 ENCOUNTER — Other Ambulatory Visit: Payer: Self-pay | Admitting: Internal Medicine

## 2018-12-09 ENCOUNTER — Other Ambulatory Visit: Payer: Self-pay

## 2018-12-09 MED ORDER — RIVAROXABAN 20 MG PO TABS: 20.0000 mg | ORAL_TABLET | Freq: Every day | ORAL | 1 refills | Status: DC

## 2018-12-31 DIAGNOSIS — G20 Parkinson's disease: Secondary | ICD-10-CM | POA: Diagnosis not present

## 2018-12-31 DIAGNOSIS — R2689 Other abnormalities of gait and mobility: Secondary | ICD-10-CM | POA: Diagnosis not present

## 2018-12-31 DIAGNOSIS — R498 Other voice and resonance disorders: Secondary | ICD-10-CM | POA: Diagnosis not present

## 2018-12-31 DIAGNOSIS — R1313 Dysphagia, pharyngeal phase: Secondary | ICD-10-CM | POA: Diagnosis not present

## 2018-12-31 DIAGNOSIS — R531 Weakness: Secondary | ICD-10-CM | POA: Diagnosis not present

## 2019-01-05 DIAGNOSIS — R498 Other voice and resonance disorders: Secondary | ICD-10-CM | POA: Diagnosis not present

## 2019-01-05 DIAGNOSIS — R2689 Other abnormalities of gait and mobility: Secondary | ICD-10-CM | POA: Diagnosis not present

## 2019-01-05 DIAGNOSIS — G20 Parkinson's disease: Secondary | ICD-10-CM | POA: Diagnosis not present

## 2019-01-05 DIAGNOSIS — R531 Weakness: Secondary | ICD-10-CM | POA: Diagnosis not present

## 2019-01-05 DIAGNOSIS — R1313 Dysphagia, pharyngeal phase: Secondary | ICD-10-CM | POA: Diagnosis not present

## 2019-01-12 DIAGNOSIS — R498 Other voice and resonance disorders: Secondary | ICD-10-CM | POA: Diagnosis not present

## 2019-01-12 DIAGNOSIS — R1313 Dysphagia, pharyngeal phase: Secondary | ICD-10-CM | POA: Diagnosis not present

## 2019-01-12 DIAGNOSIS — G20 Parkinson's disease: Secondary | ICD-10-CM | POA: Diagnosis not present

## 2019-01-12 DIAGNOSIS — R531 Weakness: Secondary | ICD-10-CM | POA: Diagnosis not present

## 2019-01-12 DIAGNOSIS — R2689 Other abnormalities of gait and mobility: Secondary | ICD-10-CM | POA: Diagnosis not present

## 2019-01-15 ENCOUNTER — Ambulatory Visit (INDEPENDENT_AMBULATORY_CARE_PROVIDER_SITE_OTHER): Payer: Medicare Other | Admitting: *Deleted

## 2019-01-15 DIAGNOSIS — I495 Sick sinus syndrome: Secondary | ICD-10-CM

## 2019-01-15 DIAGNOSIS — I480 Paroxysmal atrial fibrillation: Secondary | ICD-10-CM | POA: Diagnosis not present

## 2019-01-17 LAB — CUP PACEART REMOTE DEVICE CHECK
Battery Remaining Longevity: 120 mo
Battery Remaining Percentage: 100 %
Brady Statistic RA Percent Paced: 30 %
Brady Statistic RV Percent Paced: 1 %
Date Time Interrogation Session: 20201024041900
Implantable Lead Implant Date: 20151013
Implantable Lead Implant Date: 20151013
Implantable Lead Location: 753859
Implantable Lead Location: 753860
Implantable Lead Model: 4135
Implantable Lead Model: 4136
Implantable Lead Serial Number: 29480373
Implantable Lead Serial Number: 29608302
Implantable Pulse Generator Implant Date: 20151013
Lead Channel Impedance Value: 515 Ohm
Lead Channel Impedance Value: 616 Ohm
Lead Channel Setting Pacing Amplitude: 2 V
Lead Channel Setting Pacing Amplitude: 4 V
Lead Channel Setting Pacing Pulse Width: 1 ms
Lead Channel Setting Sensing Sensitivity: 2.5 mV
Pulse Gen Serial Number: 702951

## 2019-01-18 ENCOUNTER — Other Ambulatory Visit: Payer: Self-pay

## 2019-01-19 ENCOUNTER — Encounter: Payer: Self-pay | Admitting: Internal Medicine

## 2019-01-19 ENCOUNTER — Other Ambulatory Visit: Payer: Self-pay

## 2019-01-19 ENCOUNTER — Ambulatory Visit (INDEPENDENT_AMBULATORY_CARE_PROVIDER_SITE_OTHER): Payer: Medicare Other | Admitting: Internal Medicine

## 2019-01-19 VITALS — BP 108/58 | HR 73 | Temp 97.5°F | Resp 16 | Ht 69.0 in | Wt 169.2 lb

## 2019-01-19 DIAGNOSIS — E785 Hyperlipidemia, unspecified: Secondary | ICD-10-CM | POA: Diagnosis not present

## 2019-01-19 DIAGNOSIS — E039 Hypothyroidism, unspecified: Secondary | ICD-10-CM

## 2019-01-19 DIAGNOSIS — I480 Paroxysmal atrial fibrillation: Secondary | ICD-10-CM

## 2019-01-19 DIAGNOSIS — E119 Type 2 diabetes mellitus without complications: Secondary | ICD-10-CM | POA: Diagnosis not present

## 2019-01-19 DIAGNOSIS — E1140 Type 2 diabetes mellitus with diabetic neuropathy, unspecified: Secondary | ICD-10-CM

## 2019-01-19 DIAGNOSIS — F329 Major depressive disorder, single episode, unspecified: Secondary | ICD-10-CM | POA: Diagnosis not present

## 2019-01-19 DIAGNOSIS — F419 Anxiety disorder, unspecified: Secondary | ICD-10-CM | POA: Diagnosis not present

## 2019-01-19 DIAGNOSIS — F32A Depression, unspecified: Secondary | ICD-10-CM

## 2019-01-19 MED ORDER — SHINGRIX 50 MCG/0.5ML IM SUSR: 0.5000 mL | Freq: Once | INTRAMUSCULAR | 1 refills | Status: AC

## 2019-01-19 MED ORDER — TETANUS-DIPHTH-ACELL PERTUSSIS 5-2.5-18.5 LF-MCG/0.5 IM SUSP: 0.5000 mL | Freq: Once | INTRAMUSCULAR | 0 refills | Status: AC

## 2019-01-19 MED ORDER — SERTRALINE HCL 50 MG PO TABS: 150.0000 mg | ORAL_TABLET | Freq: Every day | ORAL | 6 refills | Status: DC

## 2019-01-19 NOTE — Patient Instructions (Addendum)
Please schedule Medicare Wellness with Glenard Haring.   GO TO THE LAB : Get the blood work     GO TO THE FRONT DESK Schedule your next appointment   for a checkup in 4 months  Increase sertraline 50 mg to 3 tablets a day  You are getting a prescription for 2 immunizations: Shingrix   Tetanus shot

## 2019-01-19 NOTE — Progress Notes (Signed)
Pre visit review using our clinic review tool, if applicable. No additional management support is needed unless otherwise documented below in the visit note. 

## 2019-01-19 NOTE — Progress Notes (Signed)
Subjective:    Patient ID: Stephen Robbins, male    DOB: 1935-08-17, 83 y.o.   MRN: [id number]  DOS:  01/19/2019 Type of visit - description: Routine visit Stress: His wife has advanced dementia, she lives at a facility, they are talking about DNR. High cholesterol: Due for labs Diabetes: Reports good medication compliance, ambulatory CBGs in the low 100s. Constipation: On and off, currently controlled. Parkinson: Had falls and difficulty swallowing, working with PT.   Review of Systems   Denies fever chills No chest pain, difficulty breathing.  No lower extremity edema  Past Medical History:  Diagnosis Date  . Arthritis    "joints; a little" (01/04/2014)  . Asymptomatic bilateral carotid artery stenosis    per duplex  05-15-2012  left >39%/   right 40-59%  . Basal cell carcinoma    nose  . Borderline diabetes   . BPH (benign prostatic hypertrophy) with urinary obstruction   . Bradycardia   . Bronchial pneumonia 1958  . Chronotropic incompetence with sinus node dysfunction (HCC)   . Compressed cervical disc   . Compression of lumbar vertebra (HCC)    L4 -- L5  . Dizzy   . Dysmetabolic syndrome   . Fatigue   . Frequency of urination   . GERD (gastroesophageal reflux disease)    occasional  . Hemorrhoids   . Hyperlipidemia   . Hypothyroidism   . Nocturia   . NSVT (nonsustained ventricular tachycardia) [facility])    cardiologist-  dr croitoru  . Pacemaker   . Urgency of urination   . Wears glasses     Past Surgical History:  Procedure Laterality Date  . BASAL CELL CARCINOMA EXCISION     nose  . CLOSED REDUCTION NASAL FRACTURE  09-01-2007  . CYSTOSCOPY N/A 12/28/2012   Procedure: CYSTOSCOPY;  Surgeon: Bernestine Amass, MD;  Location: [facility];  Service: Urology;  Laterality: N/A;  . EXERCISE TOLERENCE TEST  12-03-2012  DR CROITURO   CHRONOTROPIC INCOMPETENCE/ NORMAL RESTING BP W/ APPROPRIATE RESPONSE/ NO CHEST PAIN/ NO ST CHANGES FROM BASELINE  .  INSERT / REPLACE / REMOVE PACEMAKER  01/04/2014   [employer] model L121 serial number [id number]  . LACERATION REPAIR Right 1978   middle finger  . NEUROPLASTY / TRANSPOSITION ULNAR NERVE AT ELBOW Left 02-09-2010  . PERMANENT PACEMAKER INSERTION N/A 01/04/2014   Procedure: PERMANENT PACEMAKER INSERTION;  Surgeon: Sanda Klein, MD;  Location: [facility];  Service: Cardiovascular;  Laterality: N/A;  . SKIN BIOPSY     scc  . TONSILLECTOMY AND ADENOIDECTOMY  1944  . TRANSURETHRAL INCISION OF PROSTATE N/A 12/28/2012   Procedure: TRANSURETHRAL INCISION OF THE PROSTATE (TUIP);  Surgeon: Bernestine Amass, MD;  Location[facility];  Service: Urology;  Laterality: N/A;  . ULNAR NERVE TRANSPOSITION      Social History   Socioeconomic History  . Marital status: Married    Spouse name: Not on file  . Number of children: 0  . Years of education: Not on file  . Highest education level: Bachelor's degree (e.g., BA, AB, BS)  Occupational History  . Occupation: retired    Fish farm manager: RETIRED    Comment: executive   Social Needs  . Financial resource strain: Not on file  . Food insecurity    Worry: Not on file    Inability: Not on file  . Transportation needs    Medical: Not on file    Non-medical: Not on file  Tobacco Use  . Smoking status: Former Smoker    Packs/day: 1.00    Years: 10.00    Pack years: 10.00    Types: Cigarettes    Quit date: 03/25/1968    Years since quitting: 50.8  . Smokeless tobacco: Never Used  Substance and Sexual Activity  . Alcohol use: Yes    Alcohol/week: 7.0 standard drinks    Types: 7 Glasses of wine per week    Comment: 1 glass wine daily  . Drug use: No  . Sexual activity: Not Currently  Lifestyle  . Physical activity    Days per week: Not on file    Minutes per session: Not on file  . Stress: Not on file  Relationships  . Social Herbalist on phone: Not on file    Gets together: Not on file    Attends  religious service: Not on file    Active member of club or organization: Not on file    Attends meetings of clubs or organizations: Not on file    Relationship status: Not on file  . Intimate partner violence    Fear of current or ex partner: Not on file    Emotionally abused: Not on file    Physically abused: Not on file    Forced sexual activity: Not on file  Other Topics Concern  . Not on file  Social History Narrative   Lives at [place] as of 01/19/2019      Reactions   Penicillins Other (See Comments)   Unknown childhood reaction      Medication List       Accurate as of January 19, 2019 11:59 PM. If you have any questions, ask your nurse or doctor.        Accu-Chek FastClix Lancets Misc Check blood sugar three times daily   Accu-Chek Guide Me w/Device Kit 1 Device by Does not apply route as directed.   bisacodyl 5 MG EC tablet Commonly known as: DULCOLAX Take 5 mg daily as needed by mouth for moderate constipation.   carbidopa-levodopa 25-100 MG tablet Commonly known as: SINEMET IR Take 2 tablets by mouth 3 (three) times daily.   Farxiga 10 MG Tabs tablet Generic drug: dapagliflozin propanediol Take 10 mg by mouth daily.   gabapentin 300 MG capsule Commonly known as: NEURONTIN TAKE 1 CAPSULE(300 MG) BY MOUTH TWICE DAILY   glucose blood test strip Commonly known as: Accu-Chek Guide Check blood sugar three times daily   hydrocortisone 2.5 % rectal cream Commonly known as: Proctozone-HC Place 1 application rectally 2 (two) times daily.   Hydrocortisone Ace-Pramoxine 2.5-1 % Crea Apply rectally twice daily as needed   hydrocortisone cream 0.5 % Apply 1 application 2 (two) times daily as needed topically. rash   ketoconazole 2 % cream Commonly known as: NIZORAL Apply 1 application topically daily.   levothyroxine 75 MCG tablet Commonly known as: SYNTHROID Take 1 tablet (75 mcg total) by mouth daily before breakfast.    metFORMIN 1000 MG tablet Commonly known as: GLUCOPHAGE Take 1 tablet (1,000 mg total) by mouth 2 (two) times daily with a meal.   Myrbetriq 25 MG Tb24 tablet Generic drug: mirabegron ER Take 25 mg by mouth daily.   polyethylene glycol powder 17 GM/SCOOP powder Commonly known as: MiraLax Take 17 g daily by mouth.   rivaroxaban 20 MG Tabs tablet Commonly known as: Xarelto Take 1 tablet (20 mg total) by mouth daily.  sertraline 50 MG tablet Commonly known as: ZOLOFT Take 3 tablets (150 mg total) by mouth daily.   Shingrix injection Generic drug: Zoster Vaccine Adjuvanted Inject 0.5 mLs into the muscle once for 1 dose. Started by: Kathlene November, MD   simvastatin 20 MG tablet Commonly known as: ZOCOR Take 1 tablet (20 mg total) by mouth at bedtime.   sitaGLIPtin 100 MG tablet Commonly known as: Januvia Take 1 tablet (100 mg total) by mouth daily.   Systane Ultra 0.4-0.3 % Soln Generic drug: Polyethyl Glycol-Propyl Glycol Place 1 drop into both eyes daily as needed (dry eyes).   Tdap 5-2.5-18.5 LF-MCG/0.5 injection Commonly known as: BOOSTRIX Inject 0.5 mLs into the muscle once for 1 dose. Started by: Kathlene November, MD   vitamin B-12 1000 MCG tablet Commonly known as: CYANOCOBALAMIN Take 1,000 mcg by mouth every morning.           Objective:   Physical Exam BP (!) 108/58 (BP Location: Left Arm, Patient Position: Sitting, Cuff Size: Small)   Pulse 73   Temp (!) 97.5 F (36.4 C) (Temporal)   Resp 16   Ht 5' 9"  (1.753 m)   Wt 169 lb 4 oz (76.8 kg)   SpO2 97%   BMI 24.99 kg/m  General:   Well developed, NAD, BMI noted.  HEENT:  Normocephalic . Face symmetric, atraumatic Lungs:  CTA B Normal respiratory effort, no intercostal retractions, no accessory muscle use. Heart: RRR,  no murmur.  no pretibial edema bilaterally  Abdomen:  Not distended, soft, non-tender. No rebound or rigidity.   Skin: Not pale. Not jaundice Neurologic:  alert & oriented X3.  Speech  slow but otherwise normal.  Gait and transferring: Affected by Parkinson, needs some help transferring. Psych--  Cognition and judgment appear intact.  Cooperative with normal attention span and concentration.  Behavior appropriate. No anxious or depressed appearing.     Assessment     Assessment. (New patient 11/18/17, transferring from Dr. Raliegh Ip) Diabetes DM Neuropathy Hypothyroidism Depression Hyperlipidemia CV: Paroxysmal A. fib, sick sinus syndrome,  pacemaker, NSVT, orthostatic hypotension, on Xarelto Hypogonadism NEURO: Dr [name] --Parkinson disease --Peripheral neuropathy Constipation (h/o impaction), alternates w/ diarrhea  Urology Dr Lovena Neighbours : urine incontinence  Caregiver fatigue  PLAN DM: On Metformin, Januvia, Farxiga.  Ambulatory CBGs in the low 100s, check a CMP, A1c. Hypothyroidism: Last TSH satisfactory Depression: We had a long discussion about his wife who has dementia.  Counseling provided.  He likes to visit her in person which is now difficult due to COVID-19.  We agreed to increase sertraline to 150 mg daily.  Will call if he needs further help. Hyperlipidemia: On simvastatin, check FLP Atrial fibrillation: Controlled, on Xarelto, check a CBC. Parkinson's: Having problems with fall and swallowing, working with PT and ST. Preventive care: Shingrix and Tdap prescription printed.  Benefit discussed.  To proceed at his convenience RTC 4 months

## 2019-01-20 LAB — CBC WITH DIFFERENTIAL/PLATELET
Basophils Absolute: 0 10*3/uL (ref 0.0–0.1)
Basophils Relative: 0.4 % (ref 0.0–3.0)
Eosinophils Absolute: 0.7 10*3/uL (ref 0.0–0.7)
Eosinophils Relative: 8.2 % — ABNORMAL HIGH (ref 0.0–5.0)
HCT: 38.9 % — ABNORMAL LOW (ref 39.0–52.0)
Hemoglobin: 13.5 g/dL (ref 13.0–17.0)
Lymphocytes Relative: 17.9 % (ref 12.0–46.0)
Lymphs Abs: 1.5 10*3/uL (ref 0.7–4.0)
MCHC: 34.7 g/dL (ref 30.0–36.0)
MCV: 100 fl (ref 78.0–100.0)
Monocytes Absolute: 0.8 10*3/uL (ref 0.1–1.0)
Monocytes Relative: 9.6 % (ref 3.0–12.0)
Neutro Abs: 5.2 10*3/uL (ref 1.4–7.7)
Neutrophils Relative %: 63.9 % (ref 43.0–77.0)
Platelets: 185 10*3/uL (ref 150.0–400.0)
RBC: 3.89 Mil/uL — ABNORMAL LOW (ref 4.22–5.81)
RDW: 14.1 % (ref 11.5–15.5)
WBC: 8.2 10*3/uL (ref 4.0–10.5)

## 2019-01-20 LAB — COMPREHENSIVE METABOLIC PANEL
ALT: 6 U/L (ref 0–53)
AST: 20 U/L (ref 0–37)
Albumin: 4.3 g/dL (ref 3.5–5.2)
Alkaline Phosphatase: 52 U/L (ref 39–117)
BUN: 19 mg/dL (ref 6–23)
CO2: 27 mEq/L (ref 19–32)
Calcium: 9.3 mg/dL (ref 8.4–10.5)
Chloride: 100 mEq/L (ref 96–112)
Creatinine, Ser: 0.77 mg/dL (ref 0.40–1.50)
GFR: 96.84 mL/min (ref >60.00)
Glucose, Bld: 133 mg/dL — ABNORMAL HIGH (ref 70–99)
Potassium: 4.5 mEq/L (ref 3.5–5.1)
Sodium: 135 mEq/L (ref 135–145)
Total Bilirubin: 0.8 mg/dL (ref 0.2–1.2)
Total Protein: 6.7 g/dL (ref 6.0–8.3)

## 2019-01-20 LAB — LIPID PANEL
Cholesterol: 104 mg/dL (ref 0–200)
HDL: 31.2 mg/dL — ABNORMAL LOW (ref >39.00)
LDL Cholesterol: 52 mg/dL (ref 0–99)
NonHDL: 72.69
Total CHOL/HDL Ratio: 3
Triglycerides: 105 mg/dL (ref 0.0–149.0)
VLDL: 21 mg/dL (ref 0.0–40.0)

## 2019-01-20 LAB — HEMOGLOBIN A1C: Hgb A1c MFr Bld: 6.5 % (ref 4.6–6.5)

## 2019-01-20 NOTE — Assessment & Plan Note (Signed)
DM: On Metformin, Januvia, Farxiga.  Ambulatory CBGs in the low 100s, check a CMP, A1c. Hypothyroidism: Last TSH satisfactory Depression: We had a long discussion about his wife who has dementia.  Counseling provided.  He likes to visit her in person which is now difficult due to COVID-19.  We agreed to increase sertraline to 150 mg daily.  Will call if he needs further help. Hyperlipidemia: On simvastatin, check FLP Atrial fibrillation: Controlled, on Xarelto, check a CBC. Parkinson's: Having problems with fall and swallowing, working with PT and ST. Preventive care: Shingrix and Tdap prescription printed.  Benefit discussed.  To proceed at his convenience RTC 4 months

## 2019-01-21 ENCOUNTER — Encounter: Payer: Self-pay | Admitting: Internal Medicine

## 2019-01-25 ENCOUNTER — Telehealth: Payer: Self-pay | Admitting: Clinical

## 2019-01-25 NOTE — Telephone Encounter (Signed)
LCSW mailed sympathy card to pt in learning of death of his wife

## 2019-01-25 NOTE — Progress Notes (Signed)
Remote pacemaker transmission.   

## 2019-02-01 ENCOUNTER — Encounter: Payer: Self-pay | Admitting: Internal Medicine

## 2019-02-01 MED ORDER — LEVOTHYROXINE SODIUM 75 MCG PO TABS: 75.0000 ug | ORAL_TABLET | Freq: Every day | ORAL | 1 refills | Status: DC

## 2019-02-10 DIAGNOSIS — R1313 Dysphagia, pharyngeal phase: Secondary | ICD-10-CM | POA: Diagnosis not present

## 2019-02-10 DIAGNOSIS — R498 Other voice and resonance disorders: Secondary | ICD-10-CM | POA: Diagnosis not present

## 2019-02-10 DIAGNOSIS — R531 Weakness: Secondary | ICD-10-CM | POA: Diagnosis not present

## 2019-02-10 DIAGNOSIS — G20 Parkinson's disease: Secondary | ICD-10-CM | POA: Diagnosis not present

## 2019-02-10 DIAGNOSIS — R2689 Other abnormalities of gait and mobility: Secondary | ICD-10-CM | POA: Diagnosis not present

## 2019-02-17 DIAGNOSIS — R531 Weakness: Secondary | ICD-10-CM | POA: Diagnosis not present

## 2019-02-17 DIAGNOSIS — R498 Other voice and resonance disorders: Secondary | ICD-10-CM | POA: Diagnosis not present

## 2019-02-17 DIAGNOSIS — G20 Parkinson's disease: Secondary | ICD-10-CM | POA: Diagnosis not present

## 2019-02-17 DIAGNOSIS — R2689 Other abnormalities of gait and mobility: Secondary | ICD-10-CM | POA: Diagnosis not present

## 2019-02-17 DIAGNOSIS — R1313 Dysphagia, pharyngeal phase: Secondary | ICD-10-CM | POA: Diagnosis not present

## 2019-02-24 ENCOUNTER — Other Ambulatory Visit: Payer: Self-pay | Admitting: Internal Medicine

## 2019-02-24 DIAGNOSIS — G20 Parkinson's disease: Secondary | ICD-10-CM | POA: Diagnosis not present

## 2019-02-24 DIAGNOSIS — R498 Other voice and resonance disorders: Secondary | ICD-10-CM | POA: Diagnosis not present

## 2019-02-24 DIAGNOSIS — R1313 Dysphagia, pharyngeal phase: Secondary | ICD-10-CM | POA: Diagnosis not present

## 2019-03-04 DIAGNOSIS — R1313 Dysphagia, pharyngeal phase: Secondary | ICD-10-CM | POA: Diagnosis not present

## 2019-03-04 DIAGNOSIS — G20 Parkinson's disease: Secondary | ICD-10-CM | POA: Diagnosis not present

## 2019-03-04 DIAGNOSIS — R498 Other voice and resonance disorders: Secondary | ICD-10-CM | POA: Diagnosis not present

## 2019-03-09 ENCOUNTER — Other Ambulatory Visit: Payer: Self-pay | Admitting: Internal Medicine

## 2019-03-11 DIAGNOSIS — R1313 Dysphagia, pharyngeal phase: Secondary | ICD-10-CM | POA: Diagnosis not present

## 2019-03-11 DIAGNOSIS — R498 Other voice and resonance disorders: Secondary | ICD-10-CM | POA: Diagnosis not present

## 2019-03-11 DIAGNOSIS — G20 Parkinson's disease: Secondary | ICD-10-CM | POA: Diagnosis not present

## 2019-03-16 ENCOUNTER — Other Ambulatory Visit: Payer: Self-pay

## 2019-03-16 ENCOUNTER — Ambulatory Visit (INDEPENDENT_AMBULATORY_CARE_PROVIDER_SITE_OTHER): Payer: Medicare Other | Admitting: Medical

## 2019-03-16 ENCOUNTER — Ambulatory Visit (INDEPENDENT_AMBULATORY_CARE_PROVIDER_SITE_OTHER): Payer: Medicare Other | Admitting: Family Medicine

## 2019-03-16 ENCOUNTER — Ambulatory Visit: Payer: Self-pay

## 2019-03-16 ENCOUNTER — Encounter: Payer: Self-pay | Admitting: Medical

## 2019-03-16 ENCOUNTER — Encounter: Payer: Self-pay | Admitting: Family Medicine

## 2019-03-16 VITALS — BP 105/57 | HR 81 | Temp 96.9°F | Resp 16 | Wt 169.6 lb

## 2019-03-16 VITALS — BP 110/68 | HR 80 | Ht 69.0 in | Wt 169.0 lb

## 2019-03-16 DIAGNOSIS — M19011 Primary osteoarthritis, right shoulder: Secondary | ICD-10-CM | POA: Diagnosis present

## 2019-03-16 DIAGNOSIS — M25511 Pain in right shoulder: Secondary | ICD-10-CM

## 2019-03-16 MED ORDER — TRIAMCINOLONE ACETONIDE 40 MG/ML IJ SUSP
40.0000 mg | Freq: Once | INTRAMUSCULAR | Status: AC
  Administered 2019-03-16: 16:00:00 40 mg via INTRA_ARTICULAR

## 2019-03-16 NOTE — Progress Notes (Signed)
Subjective:    Patient ID: Stephen Robbins, male    DOB: May 25, 1935, 83 y.o.   MRN: [id number]  HPI  Pt in with rt shoulder pain. Pain has been present for about 7-8 days now. He has history of bursitis in the past.   He states in past he got cortisone for bursitis in elbow area.  No fall or trauma. No hx of shoulder pain before.  Pt is diabetic and his sugar level average was 140 in the past.     Review of Systems  Constitutional: Negative for chills, fatigue and fever.  Respiratory: Negative for cough, chest tightness, shortness of breath and wheezing.   Cardiovascular: Negative for chest pain and palpitations.  Gastrointestinal: Negative for abdominal pain.  Musculoskeletal: Negative for back pain.       Rt shoulder pain.  Skin: Negative for rash.  Neurological: Negative for dizziness and headaches.  Hematological: Negative for adenopathy. Does not bruise/bleed easily.  Psychiatric/Behavioral: Negative for behavioral problems and confusion.    Past Medical History:  Diagnosis Date  . Arthritis    "joints; a little" (01/04/2014)  . Asymptomatic bilateral carotid artery stenosis    per duplex  05-15-2012  left >39%/   right 40-59%  . Basal cell carcinoma    nose  . Borderline diabetes   . BPH (benign prostatic hypertrophy) with urinary obstruction   . Bradycardia   . Bronchial pneumonia 1958  . Chronotropic incompetence with sinus node dysfunction (HCC)   . Compressed cervical disc   . Compression of lumbar vertebra (HCC)    L4 -- L5  . Dizzy   . Dysmetabolic syndrome   . Fatigue   . Frequency of urination   . GERD (gastroesophageal reflux disease)    occasional  . Hemorrhoids   . Hyperlipidemia   . Hypothyroidism   . Nocturia   . NSVT (nonsustained ventricular tachycardia) [facility])    cardiologist-  dr croitoru  . Pacemaker   . Urgency of urination   . Wears glasses      Social History   Socioeconomic History  . Marital status: Widowed    Spouse name:  Not on file  . Number of children: 0  . Years of education: Not on file  . Highest education level: Bachelor's degree (e.g., BA, AB, BS)  Occupational History  . Occupation: retired    Fish farm manager: RETIRED    Comment: executive   Tobacco Use  . Smoking status: Former Smoker    Packs/day: 1.00    Years: 10.00    Pack years: 10.00    Types: Cigarettes    Quit date: 03/25/1968    Years since quitting: 51.0  . Smokeless tobacco: Never Used  Substance and Sexual Activity  . Alcohol use: Yes    Alcohol/week: 7.0 standard drinks    Types: 7 Glasses of wine per week    Comment: 1 glass wine daily  . Drug use: No  . Sexual activity: Not Currently  Other Topics Concern  . Not on file  Social History Narrative   Lives at [place] Strain:   . Difficulty of Paying Living Expenses: Not on file  Food Insecurity:   . Worried About Charity fundraiser in the Last Year: Not on file  . Ran Out of Food in the Last Year: Not on file  Transportation Needs:   . Lack of Transportation (Medical): Not on file  . Lack of  Transportation (Non-Medical): Not on file  Physical Activity:   . Days of Exercise per Week: Not on file  . Minutes of Exercise per Session: Not on file  Stress:   . Feeling of Stress : Not on file  Social Connections:   . Frequency of Communication with Friends and Family: Not on file  . Frequency of Social Gatherings with Friends and Family: Not on file  . Attends Religious Services: Not on file  . Active Member of Clubs or Organizations: Not on file  . Attends Archivist Meetings: Not on file  . Marital Status: Not on file  Intimate Partner Violence:   . Fear of Current or Ex-Partner: Not on file  . Emotionally Abused: Not on file  . Physically Abused: Not on file  . Sexually Abused: Not on file    Past Surgical History:  Procedure Laterality Date  . BASAL CELL CARCINOMA EXCISION     nose  . CLOSED  REDUCTION NASAL FRACTURE  09-01-2007  . CYSTOSCOPY N/A 12/28/2012   Procedure: CYSTOSCOPY;  Surgeon: Bernestine Amass, MD;  Location[facility];  Service: Urology;  Laterality: N/A;  . EXERCISE TOLERENCE TEST  12-03-2012  DR CROITURO   CHRONOTROPIC INCOMPETENCE/ NORMAL RESTING BP W/ APPROPRIATE RESPONSE/ NO CHEST PAIN/ NO ST CHANGES FROM BASELINE  . INSERT / REPLACE / REMOVE PACEMAKER  01/04/2014   [employer] model L121 serial number [id number]  . LACERATION REPAIR Right 1978   middle finger  . NEUROPLASTY / TRANSPOSITION ULNAR NERVE AT ELBOW Left 02-09-2010  . PERMANENT PACEMAKER INSERTION N/A 01/04/2014   Procedure: PERMANENT PACEMAKER INSERTION;  Surgeon: Sanda Klein, MD;  Location: [facility];  Service: Cardiovascular;  Laterality: N/A;  . SKIN BIOPSY     scc  . TONSILLECTOMY AND ADENOIDECTOMY  1944  . TRANSURETHRAL INCISION OF PROSTATE N/A 12/28/2012   Procedure: TRANSURETHRAL INCISION OF THE PROSTATE (TUIP);  Surgeon: Bernestine Amass, MD;  Location: [facility];  Service: Urology;  Laterality: N/A;  . ULNAR NERVE TRANSPOSITION      Family History  Problem Relation Age of Onset  . Stroke Father   . Lung cancer Mother   . Parkinson's disease Brother   . Heart Problems Brother     Allergies  Allergen Reactions  . Penicillins Other (See Comments)    Unknown childhood reaction    Current Outpatient Medications on File Prior to Visit  Medication Sig Dispense Refill  . ACCU-CHEK FASTCLIX LANCETS MISC Check blood sugar three times daily 300 each 12  . bisacodyl (DULCOLAX) 5 MG EC tablet Take 5 mg daily as needed by mouth for moderate constipation.    . Blood Glucose Monitoring Suppl (ACCU-CHEK GUIDE ME) w/Device KIT 1 Device by Does not apply route as directed. 1 kit 0  . carbidopa-levodopa (SINEMET IR) 25-100 MG tablet Take 2 tablets by mouth 3 (three) times daily. 540 tablet 1  . dapagliflozin propanediol (FARXIGA) 10 MG TABS tablet  Take 10 mg by mouth daily. 30 tablet 6  . gabapentin (NEURONTIN) 300 MG capsule TAKE 1 CAPSULE(300 MG) BY MOUTH TWICE DAILY 180 capsule 1  . glucose blood (ACCU-CHEK GUIDE) test strip Check blood sugar three times daily 300 each 12  . hydrocortisone (PROCTOZONE-HC) 2.5 % rectal cream Place 1 application rectally 2 (two) times daily. 30 g 5  . hydrocortisone cream 0.5 % Apply 1 application 2 (two) times daily as needed topically. rash 30 g 3  . ketoconazole (NIZORAL)  2 % cream Apply 1 application topically daily. 30 g 0  . levothyroxine (SYNTHROID) 75 MCG tablet Take 1 tablet (75 mcg total) by mouth daily before breakfast. 90 tablet 1  . metFORMIN (GLUCOPHAGE) 1000 MG tablet Take 1 tablet (1,000 mg total) by mouth 2 (two) times daily with a meal. 180 tablet 1  . mirabegron ER (MYRBETRIQ) 25 MG TB24 tablet Take 25 mg by mouth daily.    Vladimir Faster Glycol-Propyl Glycol (SYSTANE ULTRA) 0.4-0.3 % SOLN Place 1 drop into both eyes daily as needed (dry eyes).     . polyethylene glycol powder (MIRALAX) powder Take 17 g daily by mouth. 255 g 0  . Pramoxine-HC (HYDROCORTISONE ACE-PRAMOXINE) 2.5-1 % CREA Apply rectally twice daily as needed 28.35 g 0  . rivaroxaban (XARELTO) 20 MG TABS tablet Take 1 tablet (20 mg total) by mouth daily. 90 tablet 1  . sertraline (ZOLOFT) 50 MG tablet Take 3 tablets (150 mg total) by mouth daily. 90 tablet 6  . simvastatin (ZOCOR) 20 MG tablet TAKE 1 TABLET(20 MG) BY MOUTH AT BEDTIME 90 tablet 1  . sitaGLIPtin (JANUVIA) 100 MG tablet Take 1 tablet (100 mg total) by mouth daily. 30 tablet 6  . vitamin B-12 (CYANOCOBALAMIN) 1000 MCG tablet Take 1,000 mcg by mouth every morning.      No current facility-administered medications on file prior to visit.    BP (!) 105/57 (BP Location: Left Arm, Patient Position: Sitting, Cuff Size: Normal)   Pulse 81   Temp (!) 96.9 F (36.1 C) (Temporal)   Resp 16   Wt 169 lb 9.6 oz (76.9 kg)   SpO2 95%   BMI 25.05 kg/m         Objective:   Physical Exam  General- No acute distress. Pleasant patient. Neck- Full range of motion, no jvd Lungs- Clear, even and unlabored. Heart- regular rate and rhythm. Neurologic- CNII- XII grossly intact.  Rt shoulder- very difficult to palpation anterior aspect. Can't abduct the area due to pain.       Assessment & Plan:  I did call sports medicine and they said you can come down now for evaluation. They may give you injection. Depends on What sports MD thinks.  I considered nsaid/toradol but on xarelto sports med likely better option as he is in high level pain. Follow up with [place] as needed  Mackie Pai, PA-C

## 2019-03-16 NOTE — Assessment & Plan Note (Signed)
Acute in nature.  Seems to be more an exacerbation of underlying degenerative changes as opposed to a frozen shoulder. -Glenohumeral injection. -Could consider intra-articular Toradol injection or subacromial injection. -Counseled on supportive care. -Could consider imaging.

## 2019-03-16 NOTE — Progress Notes (Signed)
Stephen Robbins - 83 y.o. male MRN [id number]  Date of birth: 1935-07-19  SUBJECTIVE:  Including CC & ROS.  Chief Complaint  Patient presents with  . Shoulder Pain    right shoulder x 1.5 weeks    Stephen Robbins is a 83 y.o. male that is presenting with acute right shoulder pain.  The pain is been ongoing for about 2 weeks.  The pain is constant and severe.  Denies any specific inciting event.  Is worse with any movement.  He has limited range of motion.  No prior history of similar symptoms.  Did have a fall a few months ago.  Does physical therapy for his Parkinson's but feels that this pain inhibits his ability to rehab.  Has not tried any home modalities or medications.  Does have some radiation down to the elbow.    Review of Systems  Constitutional: Negative for fever.  HENT: Negative for congestion.   Respiratory: Negative for cough.   Cardiovascular: Negative for chest pain.  Gastrointestinal: Negative for abdominal pain.  Musculoskeletal: Positive for arthralgias and gait problem.  Skin: Negative for color change.  Neurological: Positive for tremors.  Hematological: Negative for adenopathy.    HISTORY: Past Medical, Surgical, Social, and Family History Reviewed & Updated per EMR.   Pertinent Historical Findings include:  Past Medical History:  Diagnosis Date  . Arthritis    "joints; a little" (01/04/2014)  . Asymptomatic bilateral carotid artery stenosis    per duplex  05-15-2012  left >39%/   right 40-59%  . Basal cell carcinoma    nose  . Borderline diabetes   . BPH (benign prostatic hypertrophy) with urinary obstruction   . Bradycardia   . Bronchial pneumonia 1958  . Chronotropic incompetence with sinus node dysfunction (HCC)   . Compressed cervical disc   . Compression of lumbar vertebra (HCC)    L4 -- L5  . Dizzy   . Dysmetabolic syndrome   . Fatigue   . Frequency of urination   . GERD (gastroesophageal reflux disease)    occasional  . Hemorrhoids   .  Hyperlipidemia   . Hypothyroidism   . Nocturia   . NSVT (nonsustained ventricular tachycardia) [facility])    cardiologist-  dr croitoru  . Pacemaker   . Urgency of urination   . Wears glasses     Past Surgical History:  Procedure Laterality Date  . BASAL CELL CARCINOMA EXCISION     nose  . CLOSED REDUCTION NASAL FRACTURE  09-01-2007  . CYSTOSCOPY N/A 12/28/2012   Procedure: CYSTOSCOPY;  Surgeon: Bernestine Amass, MD;  Location: [facility];  Service: Urology;  Laterality: N/A;  . EXERCISE TOLERENCE TEST  12-03-2012  DR CROITURO   CHRONOTROPIC INCOMPETENCE/ NORMAL RESTING BP W/ APPROPRIATE RESPONSE/ NO CHEST PAIN/ NO ST CHANGES FROM BASELINE  . INSERT / REPLACE / REMOVE PACEMAKER  01/04/2014   [employer] model L121 serial number [id number]  . LACERATION REPAIR Right 1978   middle finger  . NEUROPLASTY / TRANSPOSITION ULNAR NERVE AT ELBOW Left 02-09-2010  . PERMANENT PACEMAKER INSERTION N/A 01/04/2014   Procedure: PERMANENT PACEMAKER INSERTION;  Surgeon: Sanda Klein, MD;  Location: [place] [facility];  Service: Cardiovascular;  Laterality: N/A;  . SKIN BIOPSY     scc  . TONSILLECTOMY AND ADENOIDECTOMY  1944  . TRANSURETHRAL INCISION OF PROSTATE N/A 12/28/2012   Procedure: TRANSURETHRAL INCISION OF THE PROSTATE (TUIP);  Surgeon: Bernestine Amass, MD;  Location: [place] [facility];  Service: Urology;  Laterality: N/A;  . ULNAR NERVE TRANSPOSITION      Allergies  Allergen Reactions  . Penicillins Other (See Comments)    Unknown childhood reaction    Family History  Problem Relation Age of Onset  . Stroke Father   . Lung cancer Mother   . Parkinson's disease Brother   . Heart Problems Brother      Social History   Socioeconomic History  . Marital status: Widowed    Spouse name: Not on file  . Number of children: 0  . Years of education: Not on file  . Highest education level: Bachelor's degree (e.g., BA, AB, BS)  Occupational History  .  Occupation: retired    Fish farm manager: RETIRED    Comment: executive   Tobacco Use  . Smoking status: Former Smoker    Packs/day: 1.00    Years: 10.00    Pack years: 10.00    Types: Cigarettes    Quit date: 03/25/1968    Years since quitting: 51.0  . Smokeless tobacco: Never Used  Substance and Sexual Activity  . Alcohol use: Yes    Alcohol/week: 7.0 standard drinks    Types: 7 Glasses of wine per week    Comment: 1 glass wine daily  . Drug use: No  . Sexual activity: Not Currently  Other Topics Concern  . Not on file  Social History Narrative   Lives at [facility] Strain:   . Difficulty of Paying Living Expenses: Not on file  Food Insecurity:   . Worried About Charity fundraiser in the Last Year: Not on file  . Ran Out of Food in the Last Year: Not on file  Transportation Needs:   . Lack of Transportation (Medical): Not on file  . Lack of Transportation (Non-Medical): Not on file  Physical Activity:   . Days of Exercise per Week: Not on file  . Minutes of Exercise per Session: Not on file  Stress:   . Feeling of Stress : Not on file  Social Connections:   . Frequency of Communication with Friends and Family: Not on file  . Frequency of Social Gatherings with Friends and Family: Not on file  . Attends Religious Services: Not on file  . Active Member of Clubs or Organizations: Not on file  . Attends Archivist Meetings: Not on file  . Marital Status: Not on file  Intimate Partner Violence:   . Fear of Current or Ex-Partner: Not on file  . Emotionally Abused: Not on file  . Physically Abused: Not on file  . Sexually Abused: Not on file     PHYSICAL EXAM:  VS: BP 110/68   Pulse 80   Ht 5\' 9"  (1.753 m)   Wt 169 lb (76.7 kg)   BMI 24.96 kg/m  Physical Exam Gen: NAD, alert, cooperative with exam, well-appearing ENT: normal lips, normal nasal mucosa,  Eye: normal EOM, normal conjunctiva and lids CV:  no  edema, +2 pedal pulses   Resp: no accessory muscle use, non-labored,  Skin: no rashes, no areas of induration  Neuro: normal tone, normal sensation to touch Psych:  normal insight, alert and oriented MSK:  Right shoulder:  Limited ER  Limited abduction and flexion  Pain with IR and ER  Mild pain with empty can.  NVI   Limited ultrasound: Right shoulder:  Effusion noted encircling the biceps tendon. Supraspinatus with chronic changes but  no obvious bursitis or tear. Mild effusion noted in the posterior glenohumeral joint.  Summary: Findings suggestive of degenerative change of the glenohumeral joint.  Ultrasound and interpretation by Clearance Coots, MD     Aspiration/Injection Procedure Note Stephen Robbins 1936/03/15  Procedure: Injection Indications: Right shoulder pain  Procedure Details Consent: Risks of procedure as well as the alternatives and risks of each were explained to the (patient/caregiver).  Consent for procedure obtained. Time Out: Verified patient identification, verified procedure, site/side was marked, verified correct patient position, special equipment/implants available, medications/allergies/relevent history reviewed, required imaging and test results available.  Performed.  The area was cleaned with iodine and alcohol swabs.    The right glenohumeral joint was injected using 1 cc's of 40 mg Kenalog and 4 cc's of 0.25% bupivacaine with a 22 2" needle.  Ultrasound was used. Images were obtained in short views showing the injection.     A sterile dressing was applied.  Patient did tolerate procedure well.     ASSESSMENT & PLAN:   Primary osteoarthritis of right shoulder Acute in nature.  Seems to be more an exacerbation of underlying degenerative changes as opposed to a frozen shoulder. -Glenohumeral injection. -Could consider intra-articular Toradol injection or subacromial injection. -Counseled on supportive care. -Could consider  imaging.

## 2019-03-16 NOTE — Patient Instructions (Addendum)
I did call sports medicine and they said you can come down now for evaluation. They may give you injection. Depends on What sports MD thinks.  I considered nsaid/toradol but on xarelto sports med likely better option as he is in high level pain. Follow up with [place] as needed

## 2019-03-16 NOTE — Patient Instructions (Signed)
Nice to meet you Please try ice and heat  Please try voltaren over the counter   Please send me a message in MyChart with any questions or updates.  Please see me back in 4 weeks.   --Dr. Raeford Razor

## 2019-03-23 DIAGNOSIS — Z23 Encounter for immunization: Secondary | ICD-10-CM | POA: Diagnosis not present

## 2019-03-24 DIAGNOSIS — R498 Other voice and resonance disorders: Secondary | ICD-10-CM | POA: Diagnosis not present

## 2019-03-24 DIAGNOSIS — G20 Parkinson's disease: Secondary | ICD-10-CM | POA: Diagnosis not present

## 2019-03-24 DIAGNOSIS — R1313 Dysphagia, pharyngeal phase: Secondary | ICD-10-CM | POA: Diagnosis not present

## 2019-04-14 ENCOUNTER — Ambulatory Visit: Payer: Medicare Other | Admitting: Family Medicine

## 2019-04-16 ENCOUNTER — Ambulatory Visit (INDEPENDENT_AMBULATORY_CARE_PROVIDER_SITE_OTHER): Payer: Medicare Other | Admitting: *Deleted

## 2019-04-16 DIAGNOSIS — I480 Paroxysmal atrial fibrillation: Secondary | ICD-10-CM

## 2019-04-16 LAB — CUP PACEART REMOTE DEVICE CHECK
Battery Remaining Longevity: 114 mo
Battery Remaining Percentage: 100 %
Brady Statistic RA Percent Paced: 30 %
Brady Statistic RV Percent Paced: 1 %
Date Time Interrogation Session: 20210122070300
Implantable Lead Implant Date: 20151013
Implantable Lead Implant Date: 20151013
Implantable Lead Location: 753859
Implantable Lead Location: 753860
Implantable Lead Model: 4135
Implantable Lead Model: 4136
Implantable Lead Serial Number: 29480373
Implantable Lead Serial Number: 29608302
Implantable Pulse Generator Implant Date: 20151013
Lead Channel Impedance Value: 576 Ohm
Lead Channel Impedance Value: 614 Ohm
Lead Channel Setting Pacing Amplitude: 2 V
Lead Channel Setting Pacing Amplitude: 4 V
Lead Channel Setting Pacing Pulse Width: 1 ms
Lead Channel Setting Sensing Sensitivity: 2.5 mV
Pulse Gen Serial Number: 702951

## 2019-04-20 DIAGNOSIS — Z23 Encounter for immunization: Secondary | ICD-10-CM | POA: Diagnosis not present

## 2019-04-21 DIAGNOSIS — R498 Other voice and resonance disorders: Secondary | ICD-10-CM | POA: Diagnosis not present

## 2019-04-21 DIAGNOSIS — R1313 Dysphagia, pharyngeal phase: Secondary | ICD-10-CM | POA: Diagnosis not present

## 2019-04-21 DIAGNOSIS — G20 Parkinson's disease: Secondary | ICD-10-CM | POA: Diagnosis not present

## 2019-04-22 ENCOUNTER — Telehealth: Payer: Self-pay | Admitting: Internal Medicine

## 2019-04-22 NOTE — Telephone Encounter (Signed)
Received FL2 form apparently Pt has had a reaction to the COVID-19 vaccine he received on 04/20/19 and is having trouble walking, has fallen 3 times. [name] requesting to transfer him to their rehab unit. Unfortunately I can't make out the name of the nurse on the form but can be reached at [phone]. They request this form to be completed today if possible and faxed back to [phone]. Form placed in PCP red folder for completion.

## 2019-04-22 NOTE — Telephone Encounter (Signed)
LMOM on nurse line at [facility] at [phone] we have received FL2 form for Pt and had several questions. Asked they return call at their earliest convenience.

## 2019-04-22 NOTE — Telephone Encounter (Signed)
Stephen Robbins  Will be sending over a FL2 for Stephen Robbins today she is requesting that it's filled out and faxed back today. I informed her that [name] is busy seeing patients and it was no guarantee. But I will send message back.

## 2019-04-22 NOTE — Telephone Encounter (Addendum)
Please call the facility: I do not know why he is falling, if he has any symptoms of stroke such as weakness, slurred speech or any other neurological abnormality he needs to go to the ER. Also if he has mental status changes, fever, chills. Otherwise is schedule a virtual visit for tomorrow

## 2019-04-23 DIAGNOSIS — R2681 Unsteadiness on feet: Secondary | ICD-10-CM | POA: Diagnosis not present

## 2019-04-23 DIAGNOSIS — R2689 Other abnormalities of gait and mobility: Secondary | ICD-10-CM | POA: Diagnosis not present

## 2019-04-23 DIAGNOSIS — M6281 Muscle weakness (generalized): Secondary | ICD-10-CM | POA: Diagnosis not present

## 2019-04-23 DIAGNOSIS — R531 Weakness: Secondary | ICD-10-CM | POA: Diagnosis not present

## 2019-04-23 DIAGNOSIS — I482 Chronic atrial fibrillation, unspecified: Secondary | ICD-10-CM | POA: Diagnosis not present

## 2019-04-23 DIAGNOSIS — Z5181 Encounter for therapeutic drug level monitoring: Secondary | ICD-10-CM | POA: Diagnosis not present

## 2019-04-23 DIAGNOSIS — G20 Parkinson's disease: Secondary | ICD-10-CM | POA: Diagnosis not present

## 2019-04-23 DIAGNOSIS — R296 Repeated falls: Secondary | ICD-10-CM | POA: Diagnosis not present

## 2019-04-23 DIAGNOSIS — N189 Chronic kidney disease, unspecified: Secondary | ICD-10-CM | POA: Diagnosis not present

## 2019-04-23 DIAGNOSIS — Z9181 History of falling: Secondary | ICD-10-CM | POA: Diagnosis not present

## 2019-04-23 NOTE — Telephone Encounter (Signed)
LMOM at nurse line at [facility] requesting call back.

## 2019-04-26 DIAGNOSIS — M6281 Muscle weakness (generalized): Secondary | ICD-10-CM | POA: Diagnosis not present

## 2019-04-26 DIAGNOSIS — Z9181 History of falling: Secondary | ICD-10-CM | POA: Diagnosis not present

## 2019-04-26 DIAGNOSIS — R2689 Other abnormalities of gait and mobility: Secondary | ICD-10-CM | POA: Diagnosis not present

## 2019-04-26 DIAGNOSIS — G20 Parkinson's disease: Secondary | ICD-10-CM | POA: Diagnosis not present

## 2019-04-27 DIAGNOSIS — G20 Parkinson's disease: Secondary | ICD-10-CM | POA: Diagnosis not present

## 2019-04-27 DIAGNOSIS — M6281 Muscle weakness (generalized): Secondary | ICD-10-CM | POA: Diagnosis not present

## 2019-04-27 DIAGNOSIS — R2689 Other abnormalities of gait and mobility: Secondary | ICD-10-CM | POA: Diagnosis not present

## 2019-04-27 DIAGNOSIS — Z9181 History of falling: Secondary | ICD-10-CM | POA: Diagnosis not present

## 2019-04-28 DIAGNOSIS — G20 Parkinson's disease: Secondary | ICD-10-CM | POA: Diagnosis not present

## 2019-04-28 DIAGNOSIS — Z9181 History of falling: Secondary | ICD-10-CM | POA: Diagnosis not present

## 2019-04-28 DIAGNOSIS — M6281 Muscle weakness (generalized): Secondary | ICD-10-CM | POA: Diagnosis not present

## 2019-04-28 DIAGNOSIS — R2689 Other abnormalities of gait and mobility: Secondary | ICD-10-CM | POA: Diagnosis not present

## 2019-04-28 NOTE — Telephone Encounter (Signed)
Have not received a call back- FL2 faxed back to [facility] that we have tried calling several times w/ no return call. Asked that they call to schedule appt for Pt or have Pt to call and schedule to complete FL2 form.

## 2019-04-29 ENCOUNTER — Encounter: Payer: Self-pay | Admitting: Neurology

## 2019-04-30 DIAGNOSIS — R2689 Other abnormalities of gait and mobility: Secondary | ICD-10-CM | POA: Diagnosis not present

## 2019-04-30 DIAGNOSIS — M6281 Muscle weakness (generalized): Secondary | ICD-10-CM | POA: Diagnosis not present

## 2019-04-30 DIAGNOSIS — Z9181 History of falling: Secondary | ICD-10-CM | POA: Diagnosis not present

## 2019-04-30 DIAGNOSIS — R2681 Unsteadiness on feet: Secondary | ICD-10-CM | POA: Diagnosis not present

## 2019-04-30 DIAGNOSIS — G20 Parkinson's disease: Secondary | ICD-10-CM | POA: Diagnosis not present

## 2019-05-03 ENCOUNTER — Encounter: Payer: Self-pay | Admitting: Neurology

## 2019-05-03 ENCOUNTER — Other Ambulatory Visit: Payer: Self-pay

## 2019-05-03 NOTE — Progress Notes (Signed)
Virtual Visit via Phone Note (tried video visit but unable to connect patient) The purpose of this virtual visit is to provide medical care while limiting exposure to the novel coronavirus.    Consent was obtained for telephone visit:  Yes.   Answered questions that patient had about telephone interaction:  Yes.   I discussed the limitations, risks, security and privacy concerns of performing an evaluation and management service by telemedicine. I also discussed with the patient that there may be a patient responsible charge related to this service. The patient expressed understanding and agreed to proceed.  Pt location: Home Physician Location: office Name of referring provider:  Colon Branch, MD I connected with Stephen Robbins at patients initiation/request on 05/04/2019 at  1:00 PM EST by telephone and verified that I am speaking with the correct person using two identifiers. Pt MRN:  [id number] Pt DOB:  1935/06/08 Video Participants:  Stephen Robbins;     History of Present Illness:  Patient seen today in follow-up for Parkinson's disease.  My previous records as well as any outside records made available were reviewed prior to todays visit.  Patient has been to physical and speech therapy.  Records from [facility] dated January 29 were reviewed.  Records indicated that the patient has had multiple falls after receiving the second dose of his Moderna vaccine because he became weak.  Therefore, they upgraded him to the subacute nursing facility for therapy for a few days.  He is our of rehab for a few days and they have told him to continue to use a walker.  Pt denies lightheadedness, near syncope.  No hallucinations. Unfortunately, patient's wife has passed away since our last visit.  He has been down about this - "not depressed but its a hard adjustment."  Current movement d/o meds:  Carbidopa/levodopa 25/100, 2 tablets 3 times per day (sometimes trouble remembering the middle of the day  dose)   Current Outpatient Medications on File Prior to Visit  Medication Sig Dispense Refill  . ACCU-CHEK FASTCLIX LANCETS MISC Check blood sugar three times daily 300 each 12  . bisacodyl (DULCOLAX) 5 MG EC tablet Take 5 mg daily as needed by mouth for moderate constipation.    . Blood Glucose Monitoring Suppl (ACCU-CHEK GUIDE ME) w/Device KIT 1 Device by Does not apply route as directed. 1 kit 0  . carbidopa-levodopa (SINEMET IR) 25-100 MG tablet Take 2 tablets by mouth 3 (three) times daily. 540 tablet 1  . dapagliflozin propanediol (FARXIGA) 10 MG TABS tablet Take 10 mg by mouth daily. 30 tablet 6  . gabapentin (NEURONTIN) 300 MG capsule TAKE 1 CAPSULE(300 MG) BY MOUTH TWICE DAILY 180 capsule 1  . glucose blood (ACCU-CHEK GUIDE) test strip Check blood sugar three times daily 300 each 12  . hydrocortisone (PROCTOZONE-HC) 2.5 % rectal cream Place 1 application rectally 2 (two) times daily. 30 g 5  . hydrocortisone cream 0.5 % Apply 1 application 2 (two) times daily as needed topically. rash 30 g 3  . ketoconazole (NIZORAL) 2 % cream Apply 1 application topically daily. 30 g 0  . levothyroxine (SYNTHROID) 75 MCG tablet Take 1 tablet (75 mcg total) by mouth daily before breakfast. 90 tablet 1  . metFORMIN (GLUCOPHAGE) 1000 MG tablet Take 1 tablet (1,000 mg total) by mouth 2 (two) times daily with a meal. 180 tablet 1  . mirabegron ER (MYRBETRIQ) 25 MG TB24 tablet Take 25 mg by mouth daily.    Vladimir Faster  Glycol-Propyl Glycol (SYSTANE ULTRA) 0.4-0.3 % SOLN Place 1 drop into both eyes daily as needed (dry eyes).     . polyethylene glycol powder (MIRALAX) powder Take 17 g daily by mouth. 255 g 0  . Pramoxine-HC (HYDROCORTISONE ACE-PRAMOXINE) 2.5-1 % CREA Apply rectally twice daily as needed 28.35 g 0  . rivaroxaban (XARELTO) 20 MG TABS tablet Take 1 tablet (20 mg total) by mouth daily. 90 tablet 1  . sertraline (ZOLOFT) 50 MG tablet Take 3 tablets (150 mg total) by mouth daily. 90 tablet 6  .  simvastatin (ZOCOR) 20 MG tablet TAKE 1 TABLET(20 MG) BY MOUTH AT BEDTIME 90 tablet 1  . sitaGLIPtin (JANUVIA) 100 MG tablet Take 1 tablet (100 mg total) by mouth daily. 30 tablet 6  . vitamin B-12 (CYANOCOBALAMIN) 1000 MCG tablet Take 1,000 mcg by mouth every morning.      No current facility-administered medications on file prior to visit.     Observations/Objective:   There were no vitals filed for this visit. Patient is alert and oriented x3.  Speech is fluent and clear.    Assessment and Plan:   1. Parkinsons Disease  -continue carbidopa/levodopa 25/100, 2 po tid. we talked about setting alarms to help him remember middle of the day dosages.             -has Parkinsons Disease dyskinesia but no meds needed  -transient worsening of sx's after covid vaccine.  This has been seen in other Parkinsons Disease patients but discussed that this won't be permanent and would have still recommended the vaccine.  He is in physical therapy now.  He feels much better. 2.  Peripheral neuropathy             -There is evidence of this on examination.  Likely from DM.  His workup for reversible causes was negative.  His diabetes is under much better control than in the past.             -currently on gabapentin - 1 -2 times per day.  He does wish to continue given paresthesias in the feet. 3.  Hx of transaminasemia             -These were elevated in 2012 but have improved 4.  Constipation             -doesn't want to add miralax/colace.   5.  Depression              -Sertraline dose has been increased.  Has reactive depression/adjustment disorder due to death of wife.  Encouraged him to attend support groups, and even counseling with Judson Roch.  Told him to call my office and pick up with Judson Roch, but also will have Judson Roch reach out to him  Follow Up Instructions:  5 months  -I discussed the assessment and treatment plan with the patient. The patient was provided an opportunity to ask questions and all  were answered. The patient agreed with the plan and demonstrated an understanding of the instructions.   The patient was advised to call back or seek an in-person evaluation if the symptoms worsen or if the condition fails to improve as anticipated.    Total time spent on today's visit was 81mnutes, 100% spent in counseling   RTabor DO

## 2019-05-04 ENCOUNTER — Telehealth (INDEPENDENT_AMBULATORY_CARE_PROVIDER_SITE_OTHER): Payer: Medicare Other | Admitting: Neurology

## 2019-05-04 VITALS — Ht 69.0 in | Wt 160.0 lb

## 2019-05-04 DIAGNOSIS — Z9181 History of falling: Secondary | ICD-10-CM | POA: Diagnosis not present

## 2019-05-04 DIAGNOSIS — G20 Parkinson's disease: Secondary | ICD-10-CM | POA: Diagnosis not present

## 2019-05-04 DIAGNOSIS — G249 Dystonia, unspecified: Secondary | ICD-10-CM

## 2019-05-04 DIAGNOSIS — R2689 Other abnormalities of gait and mobility: Secondary | ICD-10-CM | POA: Diagnosis not present

## 2019-05-04 DIAGNOSIS — R2681 Unsteadiness on feet: Secondary | ICD-10-CM | POA: Diagnosis not present

## 2019-05-04 DIAGNOSIS — F330 Major depressive disorder, recurrent, mild: Secondary | ICD-10-CM

## 2019-05-04 DIAGNOSIS — M6281 Muscle weakness (generalized): Secondary | ICD-10-CM | POA: Diagnosis not present

## 2019-05-04 MED ORDER — CARBIDOPA-LEVODOPA 25-100 MG PO TABS: 2.0000 | ORAL_TABLET | Freq: Three times a day (TID) | ORAL | 1 refills | Status: DC

## 2019-05-05 ENCOUNTER — Encounter: Payer: Self-pay | Admitting: Internal Medicine

## 2019-05-05 ENCOUNTER — Other Ambulatory Visit: Payer: Self-pay

## 2019-05-05 ENCOUNTER — Ambulatory Visit (INDEPENDENT_AMBULATORY_CARE_PROVIDER_SITE_OTHER): Payer: Medicare Other | Admitting: Internal Medicine

## 2019-05-05 VITALS — BP 103/61 | HR 71 | Temp 96.2°F | Resp 16 | Ht 69.0 in | Wt 164.1 lb

## 2019-05-05 DIAGNOSIS — G20 Parkinson's disease: Secondary | ICD-10-CM | POA: Diagnosis not present

## 2019-05-05 DIAGNOSIS — E119 Type 2 diabetes mellitus without complications: Secondary | ICD-10-CM

## 2019-05-05 DIAGNOSIS — I480 Paroxysmal atrial fibrillation: Secondary | ICD-10-CM | POA: Diagnosis not present

## 2019-05-05 DIAGNOSIS — E039 Hypothyroidism, unspecified: Secondary | ICD-10-CM | POA: Diagnosis not present

## 2019-05-05 DIAGNOSIS — R32 Unspecified urinary incontinence: Secondary | ICD-10-CM

## 2019-05-05 DIAGNOSIS — R296 Repeated falls: Secondary | ICD-10-CM | POA: Diagnosis not present

## 2019-05-05 LAB — COMPREHENSIVE METABOLIC PANEL
ALT: 7 U/L (ref 0–53)
AST: 21 U/L (ref 0–37)
Albumin: 4.2 g/dL (ref 3.5–5.2)
Alkaline Phosphatase: 47 U/L (ref 39–117)
BUN: 22 mg/dL (ref 6–23)
CO2: 29 mEq/L (ref 19–32)
Calcium: 9.5 mg/dL (ref 8.4–10.5)
Chloride: 102 mEq/L (ref 96–112)
Creatinine, Ser: 0.74 mg/dL (ref 0.40–1.50)
GFR: 101.32 mL/min (ref >60.00)
Glucose, Bld: 125 mg/dL — ABNORMAL HIGH (ref 70–99)
Potassium: 4.4 mEq/L (ref 3.5–5.1)
Sodium: 137 mEq/L (ref 135–145)
Total Bilirubin: 0.7 mg/dL (ref 0.2–1.2)
Total Protein: 6.9 g/dL (ref 6.0–8.3)

## 2019-05-05 LAB — TSH: TSH: 2.66 u[IU]/mL (ref 0.35–4.50)

## 2019-05-05 LAB — HEMOGLOBIN A1C: Hgb A1c MFr Bld: 7 % — ABNORMAL HIGH (ref 4.6–6.5)

## 2019-05-05 MED ORDER — BLOOD PRESSURE KIT: PACK | 0 refills | Status: DC

## 2019-05-05 NOTE — Patient Instructions (Addendum)
GO TO THE LAB : Get the blood work     GO TO [facility] back for a check up in 3-4 months , please make an appointment    Check the  blood pressure 2  times a week BP GOAL is between 110/65 and  135/85. If it is consistently higher or lower, let me know

## 2019-05-05 NOTE — Progress Notes (Signed)
Subjective:    Patient ID: Stephen Robbins, male    DOB: 11/10/1935, 84 y.o.   MRN: [id number]  DOS:  05/05/2019 Type of visit - description: Routine visit We review multiple issues including hypertension, diabetes. Note from neurology reviewed. Continue with L UTS,  a chronic issue, described as sudden urinary incontinence, no difficulty urinating or blood in the urine.  Uses a diaper chronically.  Would like to see urology Also had few falls in the last few months, the last was approximately 10 days ago, he fell on a parking lot.  Denies any major injuries such as a head injury, denies neck or hip pain.     Review of Systems See above   Past Medical History:  Diagnosis Date  . Arthritis    "joints; a little" (01/04/2014)  . Asymptomatic bilateral carotid artery stenosis    per duplex  05-15-2012  left >39%/   right 40-59%  . Basal cell carcinoma    nose  . Borderline diabetes   . BPH (benign prostatic hypertrophy) with urinary obstruction   . Bradycardia   . Bronchial pneumonia 1958  . Chronotropic incompetence with sinus node dysfunction (HCC)   . Compressed cervical disc   . Compression of lumbar vertebra (HCC)    L4 -- L5  . Dizzy   . Dysmetabolic syndrome   . Fatigue   . Frequency of urination   . GERD (gastroesophageal reflux disease)    occasional  . Hemorrhoids   . Hyperlipidemia   . Hypothyroidism   . Nocturia   . NSVT (nonsustained ventricular tachycardia) [facility])    cardiologist-  dr croitoru  . Pacemaker   . Urgency of urination   . Wears glasses     Past Surgical History:  Procedure Laterality Date  . BASAL CELL CARCINOMA EXCISION     nose  . CLOSED REDUCTION NASAL FRACTURE  09-01-2007  . CYSTOSCOPY N/A 12/28/2012   Procedure: CYSTOSCOPY;  Surgeon: Bernestine Amass, MD;  Location: [facility];  Service: Urology;  Laterality: N/A;  . EXERCISE TOLERENCE TEST  12-03-2012  DR CROITURO   CHRONOTROPIC INCOMPETENCE/ NORMAL RESTING BP W/  APPROPRIATE RESPONSE/ NO CHEST PAIN/ NO ST CHANGES FROM BASELINE  . INSERT / REPLACE / REMOVE PACEMAKER  01/04/2014   [employer] model L121 serial number [id number]  . LACERATION REPAIR Right 1978   middle finger  . NEUROPLASTY / TRANSPOSITION ULNAR NERVE AT ELBOW Left 02-09-2010  . PERMANENT PACEMAKER INSERTION N/A 01/04/2014   Procedure: PERMANENT PACEMAKER INSERTION;  Surgeon: Sanda Klein, MD;  Location: [place] CATH LAB;  Service: Cardiovascular;  Laterality: N/A;  . SKIN BIOPSY     scc  . TONSILLECTOMY AND ADENOIDECTOMY  1944  . TRANSURETHRAL INCISION OF PROSTATE N/A 12/28/2012   Procedure: TRANSURETHRAL INCISION OF THE PROSTATE (TUIP);  Surgeon: Bernestine Amass, MD;  Location: [facility];  Service: Urology;  Laterality: N/A;  . ULNAR NERVE TRANSPOSITION      Allergies as of 05/05/2019      Reactions   Penicillins Other (See Comments)   Unknown childhood reaction      Medication List       Accurate as of May 05, 2019 11:59 PM. If you have any questions, ask your nurse or doctor.        STOP taking these medications   Hydrocortisone Ace-Pramoxine 2.5-1 % Crea Stopped by: Kathlene November, MD   hydrocortisone cream 0.5 % Stopped by: Kathlene November, MD  ketoconazole 2 % cream Commonly known as: NIZORAL Stopped by: Kathlene November, MD     TAKE these medications   Accu-Chek FastClix Lancets Misc Check blood sugar three times daily   Accu-Chek Guide Me w/Device Kit 1 Device by Does not apply route as directed.   bisacodyl 5 MG EC tablet Commonly known as: DULCOLAX Take 5 mg daily as needed by mouth for moderate constipation.   Blood Pressure Kit Take blood pressure twice weekly. Started by: Kathlene November, MD   carbidopa-levodopa 25-100 MG tablet Commonly known as: SINEMET IR Take 2 tablets by mouth 3 (three) times daily.   Farxiga 10 MG Tabs tablet Generic drug: dapagliflozin propanediol Take 10 mg by mouth daily.   gabapentin 300 MG capsule Commonly  known as: NEURONTIN TAKE 1 CAPSULE(300 MG) BY MOUTH TWICE DAILY   glucose blood test strip Commonly known as: Accu-Chek Guide Check blood sugar three times daily   hydrocortisone 2.5 % rectal cream Commonly known as: Proctozone-HC Place 1 application rectally 2 (two) times daily.   levothyroxine 75 MCG tablet Commonly known as: SYNTHROID Take 1 tablet (75 mcg total) by mouth daily before breakfast.   metFORMIN 1000 MG tablet Commonly known as: GLUCOPHAGE Take 1 tablet (1,000 mg total) by mouth 2 (two) times daily with a meal.   Myrbetriq 25 MG Tb24 tablet Generic drug: mirabegron ER Take 25 mg by mouth daily.   polyethylene glycol powder 17 GM/SCOOP powder Commonly known as: MiraLax Take 17 g daily by mouth.   rivaroxaban 20 MG Tabs tablet Commonly known as: Xarelto Take 1 tablet (20 mg total) by mouth daily.   sertraline 50 MG tablet Commonly known as: ZOLOFT Take 3 tablets (150 mg total) by mouth daily.   simvastatin 20 MG tablet Commonly known as: ZOCOR TAKE 1 TABLET(20 MG) BY MOUTH AT BEDTIME   sitaGLIPtin 100 MG tablet Commonly known as: Januvia Take 1 tablet (100 mg total) by mouth daily.   Systane Ultra 0.4-0.3 % Soln Generic drug: Polyethyl Glycol-Propyl Glycol Place 1 drop into both eyes daily as needed (dry eyes).   vitamin B-12 1000 MCG tablet Commonly known as: CYANOCOBALAMIN Take 1,000 mcg by mouth every morning.           Objective:   Physical Exam BP 103/61 (BP Location: Left Arm, Patient Position: Sitting, Cuff Size: Small)   Pulse 71   Temp (!) 96.2 F (35.7 C) (Temporal)   Resp 16   Ht 5' 9"  (1.753 m)   Wt 164 lb 2 oz (74.4 kg)   SpO2 94%   BMI 24.24 kg/m  General:   Well developed, NAD, BMI noted. HEENT:  Normocephalic . Face symmetric, atraumatic Lungs:  CTA B Normal respiratory effort, no intercostal retractions, no accessory muscle use. Heart: RRR,  no murmur.  No pretibial edema bilaterally  Skin: Not pale. Not  jaundice Neurologic:  alert & oriented X3.  Speech normal, gait: Uses a walker Psych--  Cognition and judgment appear intact.  Cooperative with normal attention span and concentration.  Behavior appropriate. No anxious or depressed appearing.      Assessment      Assessment. (New patient 11/18/17, transferring from Dr. Raliegh Ip) Diabetes DM Neuropathy Hypothyroidism Depression Hyperlipidemia CV: Paroxysmal A. fib, sick sinus syndrome,  pacemaker, NSVT, orthostatic hypotension, on Xarelto Hypogonadism NEURO: Dr [name] --Parkinson disease --Peripheral neuropathy Constipation (h/o impaction), alternates w/ diarrhea  Urology , urine incontinence  Caregiver fatigue  PLAN DM: Currently on farxiga, Metformin, Januvia.  Ambulatory CBGs in  the 150s.  Check A1c and CMP Hypothyroidism: On Synthroid, check a TSH Parkinson disease, neuropathy: Last visit with neurology yesterday, note reviewed, felt to be stable. Urinary incontinence: A chronic issue, request another urology evaluation.  Will arrange. Frequent falls: Had few falls in the last few months, no apparent major consequence.  Advised patient that the most important thing is prevention, he is already using his walker and working with a physical therapist. P- atrial fibrillation: Anticoagulated, risk of anticoagulation & falls discussed. We agreed to start taking his blood pressure, a prescription for cuff was sent. Social: Lives at the independent side of [place], got a FMLA paperwork and I completed it.  He so far likes to continue living independently and  I think that is possible as long as he continue using his walker and working with PT. RTC 3 to 4 months   This visit occurred during the SARS-CoV-2 public health emergency.  Safety protocols were in place, including screening questions prior to the visit, additional usage of staff PPE, and extensive cleaning of exam room while observing appropriate contact time as indicated for  disinfecting solutions.

## 2019-05-05 NOTE — Progress Notes (Signed)
Pre visit review using our clinic review tool, if applicable. No additional management support is needed unless otherwise documented below in the visit note. 

## 2019-05-06 ENCOUNTER — Telehealth: Payer: Self-pay | Admitting: Clinical

## 2019-05-06 DIAGNOSIS — M6281 Muscle weakness (generalized): Secondary | ICD-10-CM | POA: Diagnosis not present

## 2019-05-06 DIAGNOSIS — G20 Parkinson's disease: Secondary | ICD-10-CM | POA: Diagnosis not present

## 2019-05-06 DIAGNOSIS — Z9181 History of falling: Secondary | ICD-10-CM | POA: Diagnosis not present

## 2019-05-06 DIAGNOSIS — R2689 Other abnormalities of gait and mobility: Secondary | ICD-10-CM | POA: Diagnosis not present

## 2019-05-06 DIAGNOSIS — R2681 Unsteadiness on feet: Secondary | ICD-10-CM | POA: Diagnosis not present

## 2019-05-06 NOTE — Telephone Encounter (Signed)
LCSW reached out to pt to invite him to [employer] events.

## 2019-05-06 NOTE — Assessment & Plan Note (Signed)
DM: Currently on farxiga, Metformin, Januvia.  Ambulatory CBGs in the 150s.  Check A1c and CMP Hypothyroidism: On Synthroid, check a TSH Parkinson disease, neuropathy: Last visit with neurology yesterday, note reviewed, felt to be stable. Urinary incontinence: A chronic issue, request another urology evaluation.  Will arrange. Frequent falls: Had few falls in the last few months, no apparent major consequence.  Advised patient that the most important thing is prevention, he is already using his walker and working with a physical therapist. P- atrial fibrillation: Anticoagulated, risk of anticoagulation & falls discussed. We agreed to start taking his blood pressure, a prescription for cuff was sent. Social: Lives at the independent side of [employer], got a FMLA paperwork and I completed it.  He so far likes to continue living independently and  I think that is possible as long as he continue using his walker and working with PT. RTC 3 to 4 months

## 2019-05-11 DIAGNOSIS — R2681 Unsteadiness on feet: Secondary | ICD-10-CM | POA: Diagnosis not present

## 2019-05-11 DIAGNOSIS — Z9181 History of falling: Secondary | ICD-10-CM | POA: Diagnosis not present

## 2019-05-11 DIAGNOSIS — M6281 Muscle weakness (generalized): Secondary | ICD-10-CM | POA: Diagnosis not present

## 2019-05-11 DIAGNOSIS — G20 Parkinson's disease: Secondary | ICD-10-CM | POA: Diagnosis not present

## 2019-05-11 DIAGNOSIS — R2689 Other abnormalities of gait and mobility: Secondary | ICD-10-CM | POA: Diagnosis not present

## 2019-05-13 DIAGNOSIS — R2681 Unsteadiness on feet: Secondary | ICD-10-CM | POA: Diagnosis not present

## 2019-05-13 DIAGNOSIS — M6281 Muscle weakness (generalized): Secondary | ICD-10-CM | POA: Diagnosis not present

## 2019-05-13 DIAGNOSIS — Z9181 History of falling: Secondary | ICD-10-CM | POA: Diagnosis not present

## 2019-05-13 DIAGNOSIS — G20 Parkinson's disease: Secondary | ICD-10-CM | POA: Diagnosis not present

## 2019-05-13 DIAGNOSIS — R2689 Other abnormalities of gait and mobility: Secondary | ICD-10-CM | POA: Diagnosis not present

## 2019-05-17 ENCOUNTER — Encounter: Payer: Self-pay | Admitting: Internal Medicine

## 2019-05-18 DIAGNOSIS — R2689 Other abnormalities of gait and mobility: Secondary | ICD-10-CM | POA: Diagnosis not present

## 2019-05-18 DIAGNOSIS — Z9181 History of falling: Secondary | ICD-10-CM | POA: Diagnosis not present

## 2019-05-18 DIAGNOSIS — M6281 Muscle weakness (generalized): Secondary | ICD-10-CM | POA: Diagnosis not present

## 2019-05-18 DIAGNOSIS — R2681 Unsteadiness on feet: Secondary | ICD-10-CM | POA: Diagnosis not present

## 2019-05-18 DIAGNOSIS — G20 Parkinson's disease: Secondary | ICD-10-CM | POA: Diagnosis not present

## 2019-05-19 ENCOUNTER — Ambulatory Visit: Payer: Medicare Other | Admitting: Internal Medicine

## 2019-05-20 DIAGNOSIS — R3915 Urgency of urination: Secondary | ICD-10-CM | POA: Diagnosis not present

## 2019-05-20 DIAGNOSIS — N401 Enlarged prostate with lower urinary tract symptoms: Secondary | ICD-10-CM | POA: Diagnosis not present

## 2019-05-20 DIAGNOSIS — R351 Nocturia: Secondary | ICD-10-CM | POA: Diagnosis not present

## 2019-05-21 DIAGNOSIS — R2689 Other abnormalities of gait and mobility: Secondary | ICD-10-CM | POA: Diagnosis not present

## 2019-05-21 DIAGNOSIS — R2681 Unsteadiness on feet: Secondary | ICD-10-CM | POA: Diagnosis not present

## 2019-05-21 DIAGNOSIS — M6281 Muscle weakness (generalized): Secondary | ICD-10-CM | POA: Diagnosis not present

## 2019-05-21 DIAGNOSIS — G20 Parkinson's disease: Secondary | ICD-10-CM | POA: Diagnosis not present

## 2019-05-21 DIAGNOSIS — Z9181 History of falling: Secondary | ICD-10-CM | POA: Diagnosis not present

## 2019-05-25 DIAGNOSIS — Z9181 History of falling: Secondary | ICD-10-CM | POA: Diagnosis not present

## 2019-05-25 DIAGNOSIS — R2689 Other abnormalities of gait and mobility: Secondary | ICD-10-CM | POA: Diagnosis not present

## 2019-05-25 DIAGNOSIS — M6281 Muscle weakness (generalized): Secondary | ICD-10-CM | POA: Diagnosis not present

## 2019-05-25 DIAGNOSIS — G20 Parkinson's disease: Secondary | ICD-10-CM | POA: Diagnosis not present

## 2019-05-28 DIAGNOSIS — G20 Parkinson's disease: Secondary | ICD-10-CM | POA: Diagnosis not present

## 2019-05-28 DIAGNOSIS — Z9181 History of falling: Secondary | ICD-10-CM | POA: Diagnosis not present

## 2019-05-28 DIAGNOSIS — M6281 Muscle weakness (generalized): Secondary | ICD-10-CM | POA: Diagnosis not present

## 2019-05-28 DIAGNOSIS — R2689 Other abnormalities of gait and mobility: Secondary | ICD-10-CM | POA: Diagnosis not present

## 2019-05-31 DIAGNOSIS — Z9181 History of falling: Secondary | ICD-10-CM | POA: Diagnosis not present

## 2019-05-31 DIAGNOSIS — R2689 Other abnormalities of gait and mobility: Secondary | ICD-10-CM | POA: Diagnosis not present

## 2019-05-31 DIAGNOSIS — G20 Parkinson's disease: Secondary | ICD-10-CM | POA: Diagnosis not present

## 2019-05-31 DIAGNOSIS — M6281 Muscle weakness (generalized): Secondary | ICD-10-CM | POA: Diagnosis not present

## 2019-06-04 DIAGNOSIS — M6281 Muscle weakness (generalized): Secondary | ICD-10-CM | POA: Diagnosis not present

## 2019-06-04 DIAGNOSIS — Z9181 History of falling: Secondary | ICD-10-CM | POA: Diagnosis not present

## 2019-06-04 DIAGNOSIS — G20 Parkinson's disease: Secondary | ICD-10-CM | POA: Diagnosis not present

## 2019-06-04 DIAGNOSIS — R2689 Other abnormalities of gait and mobility: Secondary | ICD-10-CM | POA: Diagnosis not present

## 2019-06-07 ENCOUNTER — Other Ambulatory Visit: Payer: Self-pay

## 2019-06-07 MED ORDER — FARXIGA 10 MG PO TABS: 10.0000 mg | ORAL_TABLET | Freq: Every day | ORAL | 1 refills | Status: DC

## 2019-06-08 ENCOUNTER — Other Ambulatory Visit: Payer: Self-pay

## 2019-06-08 MED ORDER — METFORMIN HCL 1000 MG PO TABS: 1000.0000 mg | ORAL_TABLET | Freq: Two times a day (BID) | ORAL | 1 refills | Status: DC

## 2019-06-09 DIAGNOSIS — Z9181 History of falling: Secondary | ICD-10-CM | POA: Diagnosis not present

## 2019-06-09 DIAGNOSIS — R2689 Other abnormalities of gait and mobility: Secondary | ICD-10-CM | POA: Diagnosis not present

## 2019-06-09 DIAGNOSIS — G20 Parkinson's disease: Secondary | ICD-10-CM | POA: Diagnosis not present

## 2019-06-09 DIAGNOSIS — M6281 Muscle weakness (generalized): Secondary | ICD-10-CM | POA: Diagnosis not present

## 2019-06-11 DIAGNOSIS — Z9181 History of falling: Secondary | ICD-10-CM | POA: Diagnosis not present

## 2019-06-11 DIAGNOSIS — G20 Parkinson's disease: Secondary | ICD-10-CM | POA: Diagnosis not present

## 2019-06-11 DIAGNOSIS — M6281 Muscle weakness (generalized): Secondary | ICD-10-CM | POA: Diagnosis not present

## 2019-06-11 DIAGNOSIS — R2689 Other abnormalities of gait and mobility: Secondary | ICD-10-CM | POA: Diagnosis not present

## 2019-06-14 DIAGNOSIS — G20 Parkinson's disease: Secondary | ICD-10-CM | POA: Diagnosis not present

## 2019-06-14 DIAGNOSIS — Z9181 History of falling: Secondary | ICD-10-CM | POA: Diagnosis not present

## 2019-06-14 DIAGNOSIS — M6281 Muscle weakness (generalized): Secondary | ICD-10-CM | POA: Diagnosis not present

## 2019-06-14 DIAGNOSIS — R2689 Other abnormalities of gait and mobility: Secondary | ICD-10-CM | POA: Diagnosis not present

## 2019-06-18 DIAGNOSIS — Z9181 History of falling: Secondary | ICD-10-CM | POA: Diagnosis not present

## 2019-06-18 DIAGNOSIS — R2689 Other abnormalities of gait and mobility: Secondary | ICD-10-CM | POA: Diagnosis not present

## 2019-06-18 DIAGNOSIS — G20 Parkinson's disease: Secondary | ICD-10-CM | POA: Diagnosis not present

## 2019-06-18 DIAGNOSIS — M6281 Muscle weakness (generalized): Secondary | ICD-10-CM | POA: Diagnosis not present

## 2019-06-23 DIAGNOSIS — Z9181 History of falling: Secondary | ICD-10-CM | POA: Diagnosis not present

## 2019-06-23 DIAGNOSIS — G20 Parkinson's disease: Secondary | ICD-10-CM | POA: Diagnosis not present

## 2019-06-23 DIAGNOSIS — M6281 Muscle weakness (generalized): Secondary | ICD-10-CM | POA: Diagnosis not present

## 2019-06-23 DIAGNOSIS — R2689 Other abnormalities of gait and mobility: Secondary | ICD-10-CM | POA: Diagnosis not present

## 2019-06-25 DIAGNOSIS — M6281 Muscle weakness (generalized): Secondary | ICD-10-CM | POA: Diagnosis not present

## 2019-06-25 DIAGNOSIS — Z9181 History of falling: Secondary | ICD-10-CM | POA: Diagnosis not present

## 2019-06-25 DIAGNOSIS — G20 Parkinson's disease: Secondary | ICD-10-CM | POA: Diagnosis not present

## 2019-06-25 DIAGNOSIS — R2689 Other abnormalities of gait and mobility: Secondary | ICD-10-CM | POA: Diagnosis not present

## 2019-06-26 ENCOUNTER — Other Ambulatory Visit: Payer: Self-pay | Admitting: Cardiovascular Disease

## 2019-06-29 ENCOUNTER — Other Ambulatory Visit: Payer: Self-pay

## 2019-06-29 MED ORDER — GABAPENTIN 300 MG PO CAPS: ORAL_CAPSULE | ORAL | 0 refills | Status: DC

## 2019-06-30 DIAGNOSIS — Z9181 History of falling: Secondary | ICD-10-CM | POA: Diagnosis not present

## 2019-06-30 DIAGNOSIS — R2689 Other abnormalities of gait and mobility: Secondary | ICD-10-CM | POA: Diagnosis not present

## 2019-06-30 DIAGNOSIS — M6281 Muscle weakness (generalized): Secondary | ICD-10-CM | POA: Diagnosis not present

## 2019-06-30 DIAGNOSIS — G20 Parkinson's disease: Secondary | ICD-10-CM | POA: Diagnosis not present

## 2019-07-02 DIAGNOSIS — M6281 Muscle weakness (generalized): Secondary | ICD-10-CM | POA: Diagnosis not present

## 2019-07-02 DIAGNOSIS — Z9181 History of falling: Secondary | ICD-10-CM | POA: Diagnosis not present

## 2019-07-02 DIAGNOSIS — R2689 Other abnormalities of gait and mobility: Secondary | ICD-10-CM | POA: Diagnosis not present

## 2019-07-02 DIAGNOSIS — G20 Parkinson's disease: Secondary | ICD-10-CM | POA: Diagnosis not present

## 2019-07-05 DIAGNOSIS — R2689 Other abnormalities of gait and mobility: Secondary | ICD-10-CM | POA: Diagnosis not present

## 2019-07-05 DIAGNOSIS — M6281 Muscle weakness (generalized): Secondary | ICD-10-CM | POA: Diagnosis not present

## 2019-07-05 DIAGNOSIS — Z9181 History of falling: Secondary | ICD-10-CM | POA: Diagnosis not present

## 2019-07-05 DIAGNOSIS — G20 Parkinson's disease: Secondary | ICD-10-CM | POA: Diagnosis not present

## 2019-07-07 DIAGNOSIS — Z9181 History of falling: Secondary | ICD-10-CM | POA: Diagnosis not present

## 2019-07-07 DIAGNOSIS — R2689 Other abnormalities of gait and mobility: Secondary | ICD-10-CM | POA: Diagnosis not present

## 2019-07-07 DIAGNOSIS — G20 Parkinson's disease: Secondary | ICD-10-CM | POA: Diagnosis not present

## 2019-07-07 DIAGNOSIS — M6281 Muscle weakness (generalized): Secondary | ICD-10-CM | POA: Diagnosis not present

## 2019-07-09 ENCOUNTER — Telehealth: Payer: Self-pay

## 2019-07-16 ENCOUNTER — Ambulatory Visit (INDEPENDENT_AMBULATORY_CARE_PROVIDER_SITE_OTHER): Payer: Medicare Other | Admitting: *Deleted

## 2019-07-16 DIAGNOSIS — I480 Paroxysmal atrial fibrillation: Secondary | ICD-10-CM

## 2019-07-19 LAB — CUP PACEART REMOTE DEVICE CHECK
Battery Remaining Longevity: 114 mo
Battery Remaining Percentage: 100 %
Brady Statistic RA Percent Paced: 31 %
Brady Statistic RV Percent Paced: 1 %
Date Time Interrogation Session: 20210424000500
Implantable Lead Implant Date: 20151013
Implantable Lead Implant Date: 20151013
Implantable Lead Location: 753859
Implantable Lead Location: 753860
Implantable Lead Model: 4135
Implantable Lead Model: 4136
Implantable Lead Serial Number: 29480373
Implantable Lead Serial Number: 29608302
Implantable Pulse Generator Implant Date: 20151013
Lead Channel Impedance Value: 513 Ohm
Lead Channel Impedance Value: 578 Ohm
Lead Channel Setting Pacing Amplitude: 2 V
Lead Channel Setting Pacing Amplitude: 4 V
Lead Channel Setting Pacing Pulse Width: 1 ms
Lead Channel Setting Sensing Sensitivity: 2.5 mV
Pulse Gen Serial Number: 702951

## 2019-07-19 NOTE — Progress Notes (Signed)
PPM Remote  

## 2019-07-20 DIAGNOSIS — R2689 Other abnormalities of gait and mobility: Secondary | ICD-10-CM | POA: Diagnosis not present

## 2019-07-20 DIAGNOSIS — G20 Parkinson's disease: Secondary | ICD-10-CM | POA: Diagnosis not present

## 2019-07-20 DIAGNOSIS — M6281 Muscle weakness (generalized): Secondary | ICD-10-CM | POA: Diagnosis not present

## 2019-07-20 DIAGNOSIS — Z9181 History of falling: Secondary | ICD-10-CM | POA: Diagnosis not present

## 2019-07-23 DIAGNOSIS — M6281 Muscle weakness (generalized): Secondary | ICD-10-CM | POA: Diagnosis not present

## 2019-07-23 DIAGNOSIS — G20 Parkinson's disease: Secondary | ICD-10-CM | POA: Diagnosis not present

## 2019-07-23 DIAGNOSIS — Z9181 History of falling: Secondary | ICD-10-CM | POA: Diagnosis not present

## 2019-07-23 DIAGNOSIS — R2689 Other abnormalities of gait and mobility: Secondary | ICD-10-CM | POA: Diagnosis not present

## 2019-07-26 DIAGNOSIS — R2689 Other abnormalities of gait and mobility: Secondary | ICD-10-CM | POA: Diagnosis not present

## 2019-07-26 DIAGNOSIS — Z9181 History of falling: Secondary | ICD-10-CM | POA: Diagnosis not present

## 2019-07-26 DIAGNOSIS — M6281 Muscle weakness (generalized): Secondary | ICD-10-CM | POA: Diagnosis not present

## 2019-07-26 DIAGNOSIS — G20 Parkinson's disease: Secondary | ICD-10-CM | POA: Diagnosis not present

## 2019-07-29 DIAGNOSIS — Z9181 History of falling: Secondary | ICD-10-CM | POA: Diagnosis not present

## 2019-07-29 DIAGNOSIS — G20 Parkinson's disease: Secondary | ICD-10-CM | POA: Diagnosis not present

## 2019-07-29 DIAGNOSIS — R2689 Other abnormalities of gait and mobility: Secondary | ICD-10-CM | POA: Diagnosis not present

## 2019-07-29 DIAGNOSIS — M6281 Muscle weakness (generalized): Secondary | ICD-10-CM | POA: Diagnosis not present

## 2019-08-02 DIAGNOSIS — R2689 Other abnormalities of gait and mobility: Secondary | ICD-10-CM | POA: Diagnosis not present

## 2019-08-02 DIAGNOSIS — G20 Parkinson's disease: Secondary | ICD-10-CM | POA: Diagnosis not present

## 2019-08-02 DIAGNOSIS — M6281 Muscle weakness (generalized): Secondary | ICD-10-CM | POA: Diagnosis not present

## 2019-08-02 DIAGNOSIS — Z9181 History of falling: Secondary | ICD-10-CM | POA: Diagnosis not present

## 2019-08-05 DIAGNOSIS — R2689 Other abnormalities of gait and mobility: Secondary | ICD-10-CM | POA: Diagnosis not present

## 2019-08-05 DIAGNOSIS — Z9181 History of falling: Secondary | ICD-10-CM | POA: Diagnosis not present

## 2019-08-05 DIAGNOSIS — G20 Parkinson's disease: Secondary | ICD-10-CM | POA: Diagnosis not present

## 2019-08-05 DIAGNOSIS — M6281 Muscle weakness (generalized): Secondary | ICD-10-CM | POA: Diagnosis not present

## 2019-08-09 DIAGNOSIS — G20 Parkinson's disease: Secondary | ICD-10-CM | POA: Diagnosis not present

## 2019-08-09 DIAGNOSIS — R2689 Other abnormalities of gait and mobility: Secondary | ICD-10-CM | POA: Diagnosis not present

## 2019-08-09 DIAGNOSIS — M6281 Muscle weakness (generalized): Secondary | ICD-10-CM | POA: Diagnosis not present

## 2019-08-09 DIAGNOSIS — Z9181 History of falling: Secondary | ICD-10-CM | POA: Diagnosis not present

## 2019-08-12 DIAGNOSIS — Z9181 History of falling: Secondary | ICD-10-CM | POA: Diagnosis not present

## 2019-08-12 DIAGNOSIS — M6281 Muscle weakness (generalized): Secondary | ICD-10-CM | POA: Diagnosis not present

## 2019-08-12 DIAGNOSIS — R2689 Other abnormalities of gait and mobility: Secondary | ICD-10-CM | POA: Diagnosis not present

## 2019-08-12 DIAGNOSIS — G20 Parkinson's disease: Secondary | ICD-10-CM | POA: Diagnosis not present

## 2019-08-16 DIAGNOSIS — M6281 Muscle weakness (generalized): Secondary | ICD-10-CM | POA: Diagnosis not present

## 2019-08-16 DIAGNOSIS — R2689 Other abnormalities of gait and mobility: Secondary | ICD-10-CM | POA: Diagnosis not present

## 2019-08-16 DIAGNOSIS — G20 Parkinson's disease: Secondary | ICD-10-CM | POA: Diagnosis not present

## 2019-08-16 DIAGNOSIS — Z9181 History of falling: Secondary | ICD-10-CM | POA: Diagnosis not present

## 2019-08-19 DIAGNOSIS — M6281 Muscle weakness (generalized): Secondary | ICD-10-CM | POA: Diagnosis not present

## 2019-08-19 DIAGNOSIS — G20 Parkinson's disease: Secondary | ICD-10-CM | POA: Diagnosis not present

## 2019-08-19 DIAGNOSIS — Z9181 History of falling: Secondary | ICD-10-CM | POA: Diagnosis not present

## 2019-08-19 DIAGNOSIS — R2689 Other abnormalities of gait and mobility: Secondary | ICD-10-CM | POA: Diagnosis not present

## 2019-08-25 ENCOUNTER — Telehealth: Payer: Self-pay | Admitting: Internal Medicine

## 2019-08-25 MED ORDER — LEVOTHYROXINE SODIUM 75 MCG PO TABS: 75.0000 ug | ORAL_TABLET | Freq: Every day | ORAL | 0 refills | Status: DC

## 2019-08-25 NOTE — Telephone Encounter (Signed)
Rx sent 

## 2019-08-25 NOTE — Telephone Encounter (Signed)
Patient is completely out  Medication: levothyroxine (SYNTHROID) 75 MCG tablet       Has the patient contacted their pharmacy? (If no, request that the patient contact the pharmacy for the refill.) (If yes, when and what did the pharmacy advise?)     Preferred Pharmacy (with phone number or street name): [facility] [address], [address]  [place], [place] [address]  Phone:  [phone] Fax:  [phone]      Agent: Please be advised that RX refills may take up to 3 business days. We ask that you follow-up with your pharmacy.

## 2019-09-01 ENCOUNTER — Encounter: Payer: Self-pay | Admitting: Internal Medicine

## 2019-09-01 ENCOUNTER — Other Ambulatory Visit: Payer: Self-pay

## 2019-09-01 ENCOUNTER — Ambulatory Visit (INDEPENDENT_AMBULATORY_CARE_PROVIDER_SITE_OTHER): Payer: Medicare Other | Admitting: Internal Medicine

## 2019-09-01 VITALS — BP 148/88 | HR 96 | Temp 98.1°F | Resp 18 | Ht 69.0 in | Wt 163.0 lb

## 2019-09-01 DIAGNOSIS — F419 Anxiety disorder, unspecified: Secondary | ICD-10-CM

## 2019-09-01 DIAGNOSIS — F4321 Adjustment disorder with depressed mood: Secondary | ICD-10-CM

## 2019-09-01 DIAGNOSIS — E039 Hypothyroidism, unspecified: Secondary | ICD-10-CM

## 2019-09-01 DIAGNOSIS — E119 Type 2 diabetes mellitus without complications: Secondary | ICD-10-CM

## 2019-09-01 DIAGNOSIS — F329 Major depressive disorder, single episode, unspecified: Secondary | ICD-10-CM | POA: Diagnosis not present

## 2019-09-01 DIAGNOSIS — F32A Depression, unspecified: Secondary | ICD-10-CM

## 2019-09-01 LAB — HEMOGLOBIN A1C: Hgb A1c MFr Bld: 7.8 % — ABNORMAL HIGH (ref 4.6–6.5)

## 2019-09-01 LAB — TSH: TSH: 2.62 u[IU]/mL (ref 0.35–4.50)

## 2019-09-01 MED ORDER — BLOOD PRESSURE KIT: PACK | 0 refills | Status: DC

## 2019-09-01 MED ORDER — SERTRALINE HCL 100 MG PO TABS: 100.0000 mg | ORAL_TABLET | Freq: Two times a day (BID) | ORAL | 3 refills | Status: DC

## 2019-09-01 MED ORDER — TETANUS-DIPHTH-ACELL PERTUSSIS 5-2.5-18.5 LF-MCG/0.5 IM SUSP: 0.5000 mL | Freq: Once | INTRAMUSCULAR | 0 refills | Status: AC

## 2019-09-01 MED ORDER — SITAGLIPTIN PHOSPHATE 100 MG PO TABS: 100.0000 mg | ORAL_TABLET | Freq: Every day | ORAL | 6 refills | Status: DC

## 2019-09-01 NOTE — Progress Notes (Addendum)
Subjective:    Patient ID: Stephen Robbins, male    DOB: March 08, 1936, 84 y.o.   MRN: [id number]  DOS:  09/01/2019 Type of visit - description: Routine follow-up His main concern today is grieving, he is not doing well, still very emotional and tearful. Good compliance with medications including sertraline except for the last few days when he ran out Is sleeping well. Denies suicidal ideas     Review of Systems See above   Past Medical History:  Diagnosis Date  . Arthritis    "joints; a little" (01/04/2014)  . Asymptomatic bilateral carotid artery stenosis    per duplex  05-15-2012  left >39%/   right 40-59%  . Basal cell carcinoma    nose  . Borderline diabetes   . BPH (benign prostatic hypertrophy) with urinary obstruction   . Bradycardia   . Bronchial pneumonia 1958  . Chronotropic incompetence with sinus node dysfunction (HCC)   . Compressed cervical disc   . Compression of lumbar vertebra (HCC)    L4 -- L5  . Dizzy   . Dysmetabolic syndrome   . Fatigue   . Frequency of urination   . GERD (gastroesophageal reflux disease)    occasional  . Hemorrhoids   . Hyperlipidemia   . Hypothyroidism   . Nocturia   . NSVT (nonsustained ventricular tachycardia) [facility])    cardiologist-  dr croitoru  . Pacemaker   . Urgency of urination   . Wears glasses     Past Surgical History:  Procedure Laterality Date  . BASAL CELL CARCINOMA EXCISION     nose  . CLOSED REDUCTION NASAL FRACTURE  09-01-2007  . CYSTOSCOPY N/A 12/28/2012   Procedure: CYSTOSCOPY;  Surgeon: Bernestine Amass, MD;  Location: [facility];  Service: Urology;  Laterality: N/A;  . EXERCISE TOLERENCE TEST  12-03-2012  DR CROITURO   CHRONOTROPIC INCOMPETENCE/ NORMAL RESTING BP W/ APPROPRIATE RESPONSE/ NO CHEST PAIN/ NO ST CHANGES FROM BASELINE  . INSERT / REPLACE / REMOVE PACEMAKER  01/04/2014   [employer] model L121 serial number [id number]  . LACERATION REPAIR Right 1978   middle finger   . NEUROPLASTY / TRANSPOSITION ULNAR NERVE AT ELBOW Left 02-09-2010  . PERMANENT PACEMAKER INSERTION N/A 01/04/2014   Procedure: PERMANENT PACEMAKER INSERTION;  Surgeon: Sanda Klein, MD;  Location: [place] CATH LAB;  Service: Cardiovascular;  Laterality: N/A;  . SKIN BIOPSY     scc  . TONSILLECTOMY AND ADENOIDECTOMY  1944  . TRANSURETHRAL INCISION OF PROSTATE N/A 12/28/2012   Procedure: TRANSURETHRAL INCISION OF THE PROSTATE (TUIP);  Surgeon: Bernestine Amass, MD;  Location: [facility];  Service: Urology;  Laterality: N/A;  . ULNAR NERVE TRANSPOSITION      Allergies as of 09/01/2019      Reactions   Penicillins Other (See Comments)   Unknown childhood reaction      Medication List       Accurate as of September 01, 2019 11:59 PM. If you have any questions, ask your nurse or doctor.        STOP taking these medications   tamsulosin 0.4 MG Caps capsule Commonly known as: FLOMAX Stopped by: Kathlene November, MD     TAKE these medications   Accu-Chek FastClix Lancets Misc Check blood sugar three times daily   Accu-Chek Guide Me w/Device Kit 1 Device by Does not apply route as directed.   bisacodyl 5 MG EC tablet Commonly known as: DULCOLAX Take 5 mg daily  as needed by mouth for moderate constipation.   Blood Pressure Kit Take blood pressure twice weekly.   carbidopa-levodopa 25-100 MG tablet Commonly known as: SINEMET IR Take 2 tablets by mouth 3 (three) times daily.   Farxiga 10 MG Tabs tablet Generic drug: dapagliflozin propanediol Take 10 mg by mouth daily.   gabapentin 300 MG capsule Commonly known as: NEURONTIN TAKE 1 CAPSULE(300 MG) BY MOUTH TWICE DAILY   glucose blood test strip Commonly known as: Accu-Chek Guide Check blood sugar three times daily   hydrocortisone 2.5 % rectal cream Commonly known as: Proctozone-HC Place 1 application rectally 2 (two) times daily.   levothyroxine 75 MCG tablet Commonly known as: SYNTHROID Take 1 tablet (75 mcg total)  by mouth daily before breakfast.   metFORMIN 1000 MG tablet Commonly known as: GLUCOPHAGE Take 1 tablet (1,000 mg total) by mouth 2 (two) times daily with a meal.   Myrbetriq 25 MG Tb24 tablet Generic drug: mirabegron ER Take 25 mg by mouth daily.   polyethylene glycol powder 17 GM/SCOOP powder Commonly known as: MiraLax Take 17 g daily by mouth.   sertraline 100 MG tablet Commonly known as: ZOLOFT Take 1 tablet (100 mg total) by mouth in the morning and at bedtime. What changed:   medication strength  how much to take  when to take this Changed by: Kathlene November, MD   simvastatin 20 MG tablet Commonly known as: ZOCOR TAKE 1 TABLET(20 MG) BY MOUTH AT BEDTIME   sitaGLIPtin 100 MG tablet Commonly known as: Januvia Take 1 tablet (100 mg total) by mouth daily.   Systane Ultra 0.4-0.3 % Soln Generic drug: Polyethyl Glycol-Propyl Glycol Place 1 drop into both eyes daily as needed (dry eyes).   Tdap 5-2.5-18.5 LF-MCG/0.5 injection Commonly known as: BOOSTRIX Inject 0.5 mLs into the muscle once for 1 dose. Started by: Kathlene November, MD   vitamin B-12 1000 MCG tablet Commonly known as: CYANOCOBALAMIN Take 1,000 mcg by mouth every morning.   Xarelto 20 MG Tabs tablet Generic drug: rivaroxaban TAKE 1 TABLET(20 MG) BY MOUTH DAILY          Objective:   Physical Exam BP (!) 148/88 (BP Location: Left Arm, Patient Position: Sitting, Cuff Size: Small)   Pulse 96   Temp 98.1 F (36.7 C) (Temporal)   Resp 18   Ht 5' 9"  (1.753 m)   Wt 163 lb (73.9 kg)   SpO2 97%   BMI 24.07 kg/m  General:   Well developed, NAD, BMI noted. HEENT:  Normocephalic . Face symmetric, atraumatic Skin: Not pale. Not jaundice Neurologic:  alert & oriented X3.  Speech normal Psych--  Cognition and judgment appear intact.  Cooperative with normal attention span and concentration.  Behavior appropriate. Was very emotional and at times tearful when we talk about his wife     Assessment     ASSESSMENT (New patient 11/18/17, transferring from Dr. Raliegh Ip) Diabetes DM Neuropathy Hypothyroidism Depression Hyperlipidemia CV: Paroxysmal A. fib, sick sinus syndrome,  pacemaker, NSVT, orthostatic hypotension, on Xarelto Hypogonadism NEURO: Dr Carles Collet --Parkinson disease --Peripheral neuropathy Constipation (h/o impaction), alternates w/ diarrhea  Urology , urine incontinence   PLAN Depression,  grieving. Patient lost his wife after  23 years of marriage on October 2020, still very affected by that. I provided extensive listening therapy.  No suicidal ideas. We agreed on: Reach out for a counselor, information provided Increase sertraline to 200 mg daily, see AVS. Definitely call if that is not improving gradually. DM: Continue with  Januvia, Metformin, Farxiga, check A1c. Hypothyroidism: Check TSH Parkinson disease, neuropathy: To see neurology soon.  Handicap sticker provided RTC 3 months.      This visit occurred during the SARS-CoV-2 public health emergency.  Safety protocols were in place, including screening questions prior to the visit, additional usage of staff PPE, and extensive cleaning of exam room while observing appropriate contact time as indicated for disinfecting solutions.

## 2019-09-01 NOTE — Patient Instructions (Addendum)
Please schedule Medicare Wellness with Glenard Haring.    Stop taking sertraline 50 mg tablets Take sertraline 100 mg tablets: 2 tablets daily.   If they are very large and you cannot swallow you can currently have otherwise let me know.  Please reach out to one of our counselors  You are due to get a tetanus booster, see prescription  GO TO THE LAB : Get the blood work     [place], [place] back for   for a checkup in 3 months

## 2019-09-01 NOTE — Progress Notes (Signed)
Pre visit review using our clinic review tool, if applicable. No additional management support is needed unless otherwise documented below in the visit note. 

## 2019-09-02 DIAGNOSIS — M79675 Pain in left toe(s): Secondary | ICD-10-CM | POA: Diagnosis not present

## 2019-09-02 DIAGNOSIS — M79674 Pain in right toe(s): Secondary | ICD-10-CM | POA: Diagnosis not present

## 2019-09-02 DIAGNOSIS — B351 Tinea unguium: Secondary | ICD-10-CM | POA: Diagnosis not present

## 2019-09-02 NOTE — Assessment & Plan Note (Signed)
Depression,  grieving. Patient lost his wife after  11 years of marriage on October 2020, still very affected by that. I provided extensive listening therapy.  No suicidal ideas. We agreed on: Reach out for a counselor, information provided Increase sertraline to 200 mg daily, see AVS. Definitely call if that is not improving gradually. DM: Continue with Januvia, Metformin, Farxiga, check A1c. Hypothyroidism: Check TSH Parkinson disease, neuropathy: To see neurology soon.  Handicap sticker provided RTC 3 months.

## 2019-09-17 ENCOUNTER — Telehealth: Payer: Self-pay

## 2019-09-17 MED ORDER — LEVOTHYROXINE SODIUM 75 MCG PO TABS: 75.0000 ug | ORAL_TABLET | Freq: Every day | ORAL | 1 refills | Status: DC

## 2019-09-17 NOTE — Telephone Encounter (Signed)
Patient called in to get a prescription refill for a 90 Day supply for levothyroxine (SYNTHROID) 75 MCG tablet [[id number]]    Please send it to [facility], [address]  [place], [place] [address]  Phone:  [phone] Fax:  [phone]  DEA #:  [id number]

## 2019-09-17 NOTE — Telephone Encounter (Signed)
Rx sent 

## 2019-09-29 NOTE — Progress Notes (Signed)
Assessment/Plan:   1.  Parkinsons Disease  -Continue carbidopa/levodopa 25/100, 2 tablets 3 times per day  2.  PD dyskinesia  -Mild and intermittent.  No medicine required. 3.  Diabetic peripheral neuropathy  -On gabapentin, 300 mg, 1 tablet 1-2 times per day (pt believes q day now) 4.  Depression  -On sertraline managed by primary care.  This was just increased to 200 mg on June 9.  Counseling recommended and discussed this today.  Discussed hospice grief services.  Discussed that we have people to refer to as well. He would like a referral to someone in [place] and we will ask Dr. [name] as pt stated that he had someone in [place] to refer to    Subjective:   Stephen Robbins was seen today in follow up for Parkinsons disease.  My previous records were reviewed prior to todays visit as well as outside records available to me. Pt has had falls.  The most significant was after his 2nd covid vaccine about a month ago.  He went to rehab for a week after this.  He did PT once released but he is done now.  He is involved with some exercise classes.  Pt denies lightheadedness, near syncope.  No hallucinations.  Last saw primary care on June 9.  His sertraline was increased to 200 mg daily.  Pt has not noted any difference yet. Missing his wife.   "I miss her every moment."  Current prescribed movement disorder medications: Carbidopa/levodopa 25/100, 2 tablets 3 times per day Gabapentin, 300 mg, 1 tablet 1-2 times per day (pt believes q day now)   ALLERGIES:   Allergies  Allergen Reactions  . Penicillins Other (See Comments)    Unknown childhood reaction    CURRENT MEDICATIONS:  Outpatient Encounter Medications as of 09/30/2019  Medication Sig  . ACCU-CHEK FASTCLIX LANCETS MISC Check blood sugar three times daily  . bisacodyl (DULCOLAX) 5 MG EC tablet Take 5 mg daily as needed by mouth for moderate constipation.  . Blood Glucose Monitoring Suppl (ACCU-CHEK GUIDE ME) w/Device KIT 1 Device by Does not  apply route as directed.  . Blood Pressure KIT Take blood pressure twice weekly.  . carbidopa-levodopa (SINEMET IR) 25-100 MG tablet Take 2 tablets by mouth 3 (three) times daily.  . dapagliflozin propanediol (FARXIGA) 10 MG TABS tablet Take 10 mg by mouth daily.  Marland Kitchen gabapentin (NEURONTIN) 300 MG capsule TAKE 1 CAPSULE(300 MG) BY MOUTH TWICE DAILY  . glucose blood (ACCU-CHEK GUIDE) test strip Check blood sugar three times daily  . hydrocortisone (PROCTOZONE-HC) 2.5 % rectal cream Place 1 application rectally 2 (two) times daily.  Marland Kitchen levothyroxine (SYNTHROID) 75 MCG tablet Take 1 tablet (75 mcg total) by mouth daily before breakfast.  . metFORMIN (GLUCOPHAGE) 1000 MG tablet Take 1 tablet (1,000 mg total) by mouth 2 (two) times daily with a meal.  . mirabegron ER (MYRBETRIQ) 25 MG TB24 tablet Take 25 mg by mouth daily.  Vladimir Faster Glycol-Propyl Glycol (SYSTANE ULTRA) 0.4-0.3 % SOLN Place 1 drop into both eyes daily as needed (dry eyes).   . polyethylene glycol powder (MIRALAX) powder Take 17 g daily by mouth.  . sertraline (ZOLOFT) 100 MG tablet Take 1 tablet (100 mg total) by mouth in the morning and at bedtime.  . simvastatin (ZOCOR) 20 MG tablet TAKE 1 TABLET(20 MG) BY MOUTH AT BEDTIME  . sitaGLIPtin (JANUVIA) 100 MG tablet Take 1 tablet (100 mg total) by mouth daily.  . vitamin B-12 (  CYANOCOBALAMIN) 1000 MCG tablet Take 1,000 mcg by mouth every morning.   Alveda Reasons 20 MG TABS tablet TAKE 1 TABLET(20 MG) BY MOUTH DAILY   No facility-administered encounter medications on file as of 09/30/2019.    Objective:   PHYSICAL EXAMINATION:    VITALS:   Vitals:   09/30/19 1519  BP: 99/63  Pulse: 71  SpO2: 97%  Weight: 163 lb (73.9 kg)  Height: 5' 9"  (1.753 m)    GEN:  The patient appears stated age and is in NAD. HEENT:  Normocephalic, atraumatic.  The mucous membranes are moist. The superficial temporal arteries are without ropiness or tenderness. CV:  RRR Lungs:  CTAB Neck/HEME:  There  are no carotid bruits bilaterally.  Neurological examination:  Orientation: The patient is alert and oriented x3. Cranial nerves: There is good facial symmetry with facial hypomimia. The speech is fluent and clear. Soft palate rises symmetrically and there is no tongue deviation. Hearing is intact to conversational tone. Sensation: Sensation is intact to light touch throughout Motor: Strength is at least antigravity x4.  Movement examination: Tone: There is normal tone in the upper and lower extremities Abnormal movements: there is mild L shoulder dyskinesia; there is mild RUE tremor (mostly the R hand) Coordination:  There is no decremation with RAM's,  Gait and Station: The patient has no difficulty arising out of a deep-seated chair without the use of the hands. The patient's stride length is just slightly decreased.  He carries his walking stick.    I have reviewed and interpreted the following labs independently    Chemistry      Component Value Date/Time   NA 137 05/05/2019 1201   K 4.4 05/05/2019 1201   CL 102 05/05/2019 1201   CO2 29 05/05/2019 1201   BUN 22 05/05/2019 1201   CREATININE 0.74 05/05/2019 1201   CREATININE 0.77 12/29/2013 1627      Component Value Date/Time   CALCIUM 9.5 05/05/2019 1201   ALKPHOS 47 05/05/2019 1201   AST 21 05/05/2019 1201   ALT 7 05/05/2019 1201   BILITOT 0.7 05/05/2019 1201       Lab Results  Component Value Date   WBC 8.2 01/19/2019   HGB 13.5 01/19/2019   HCT 38.9 (L) 01/19/2019   MCV 100.0 01/19/2019   PLT 185.0 01/19/2019    Lab Results  Component Value Date   TSH 2.62 09/01/2019     Total time spent on today's visit was 30 minutes, including both face-to-face time and nonface-to-face time.  Time included that spent on review of records (prior notes available to me/labs/imaging if pertinent), discussing treatment and goals, answering patient's questions and coordinating care.  Cc:  Colon Branch, MD

## 2019-09-30 ENCOUNTER — Ambulatory Visit (INDEPENDENT_AMBULATORY_CARE_PROVIDER_SITE_OTHER): Payer: Medicare Other | Admitting: Neurology

## 2019-09-30 ENCOUNTER — Encounter: Payer: Self-pay | Admitting: Neurology

## 2019-09-30 ENCOUNTER — Other Ambulatory Visit: Payer: Self-pay

## 2019-09-30 VITALS — BP 99/63 | HR 71 | Ht 69.0 in | Wt 163.0 lb

## 2019-09-30 DIAGNOSIS — G249 Dystonia, unspecified: Secondary | ICD-10-CM | POA: Diagnosis not present

## 2019-09-30 DIAGNOSIS — G20 Parkinson's disease: Secondary | ICD-10-CM

## 2019-09-30 DIAGNOSIS — F330 Major depressive disorder, recurrent, mild: Secondary | ICD-10-CM

## 2019-09-30 NOTE — Patient Instructions (Signed)
1.  Continue carbidopa/levodopa 25/100, 2 tablets three times per day 2.  I will contact Dr. [name] about the counseling.   3.  Exercise!  The physicians and staff at [facility] are committed to providing excellent care. You may receive a survey requesting feedback about your experience at our office. We strive to receive "very good" responses to the survey questions. If you feel that your experience would prevent you from giving the office a "very good " response, please contact our office to try to remedy the situation. We may be reached at [phone]. Thank you for taking the time out of your busy day to complete the survey.

## 2019-10-04 NOTE — Telephone Encounter (Signed)
Late entry- called Pt and LMOM on Friday 10/01/19 and asked for return call.

## 2019-10-08 ENCOUNTER — Encounter: Payer: Self-pay | Admitting: Internal Medicine

## 2019-10-15 ENCOUNTER — Ambulatory Visit (INDEPENDENT_AMBULATORY_CARE_PROVIDER_SITE_OTHER): Payer: Medicare Other | Admitting: *Deleted

## 2019-10-15 DIAGNOSIS — I495 Sick sinus syndrome: Secondary | ICD-10-CM

## 2019-10-15 DIAGNOSIS — I4729 Other ventricular tachycardia: Secondary | ICD-10-CM

## 2019-10-15 DIAGNOSIS — I472 Ventricular tachycardia: Secondary | ICD-10-CM

## 2019-10-15 LAB — CUP PACEART REMOTE DEVICE CHECK
Battery Remaining Longevity: 102 mo
Battery Remaining Percentage: 96 %
Brady Statistic RA Percent Paced: 31 %
Brady Statistic RV Percent Paced: 2 %
Date Time Interrogation Session: 20210723082100
Implantable Lead Implant Date: 20151013
Implantable Lead Implant Date: 20151013
Implantable Lead Location: 753859
Implantable Lead Location: 753860
Implantable Lead Model: 4135
Implantable Lead Model: 4136
Implantable Lead Serial Number: 29480373
Implantable Lead Serial Number: 29608302
Implantable Pulse Generator Implant Date: 20151013
Lead Channel Impedance Value: 547 Ohm
Lead Channel Impedance Value: 602 Ohm
Lead Channel Setting Pacing Amplitude: 2 V
Lead Channel Setting Pacing Amplitude: 4 V
Lead Channel Setting Pacing Pulse Width: 1 ms
Lead Channel Setting Sensing Sensitivity: 2.5 mV
Pulse Gen Serial Number: 702951

## 2019-10-18 NOTE — Progress Notes (Signed)
Remote pacemaker transmission.   

## 2019-10-25 ENCOUNTER — Telehealth: Payer: Self-pay | Admitting: *Deleted

## 2019-10-25 NOTE — Telephone Encounter (Signed)
-----   Message from Sanda Klein, MD sent at 10/24/2019  3:12 PM EDT ----- Remote reviewed.   Not pacemaker dependent. Battery status is good. There is R wave undersensing. Other lead measurements are stable. Heart rate histogram is favorable. No clinically significant episodes of high ventricular rate. Rare paroxysmal atrial fibrillation (burden <1%), mild RVR.  Please schedule device clinic appointment for in-office R wave check. May need to lower sensing threshold, change to unipolar V sensing or even program AAIR if appropriate. If no device clinic appt available please schedule with me on August 9 (double book if necessary).

## 2019-10-25 NOTE — Telephone Encounter (Signed)
Pt declined appointment on 10/26/19, but agreeable to appointment in [place] on 10/28/19 at 3:30pm. Office address and phone number provided for any questions or concerns in the interim. Pt denies additional questions at this time.

## 2019-10-28 ENCOUNTER — Telehealth: Payer: Self-pay

## 2019-10-28 NOTE — Telephone Encounter (Signed)
The pt called stating he is sick to his stomach and will not be able to make his appointment. I let him speak with the scheduler to reschedule his appointment.

## 2019-11-04 DIAGNOSIS — B351 Tinea unguium: Secondary | ICD-10-CM | POA: Diagnosis not present

## 2019-11-04 DIAGNOSIS — M79674 Pain in right toe(s): Secondary | ICD-10-CM | POA: Diagnosis not present

## 2019-11-04 DIAGNOSIS — M79675 Pain in left toe(s): Secondary | ICD-10-CM | POA: Diagnosis not present

## 2019-11-07 ENCOUNTER — Other Ambulatory Visit: Payer: Self-pay | Admitting: Internal Medicine

## 2019-11-07 ENCOUNTER — Other Ambulatory Visit: Payer: Self-pay | Admitting: Neurology

## 2019-11-09 ENCOUNTER — Ambulatory Visit (INDEPENDENT_AMBULATORY_CARE_PROVIDER_SITE_OTHER): Payer: Medicare Other | Admitting: Emergency Medicine

## 2019-11-09 ENCOUNTER — Other Ambulatory Visit: Payer: Self-pay

## 2019-11-09 DIAGNOSIS — I495 Sick sinus syndrome: Secondary | ICD-10-CM

## 2019-11-09 DIAGNOSIS — H5213 Myopia, bilateral: Secondary | ICD-10-CM | POA: Diagnosis not present

## 2019-11-09 DIAGNOSIS — R001 Bradycardia, unspecified: Secondary | ICD-10-CM

## 2019-11-09 DIAGNOSIS — E1136 Type 2 diabetes mellitus with diabetic cataract: Secondary | ICD-10-CM | POA: Diagnosis not present

## 2019-11-09 DIAGNOSIS — H52203 Unspecified astigmatism, bilateral: Secondary | ICD-10-CM | POA: Diagnosis not present

## 2019-11-09 DIAGNOSIS — H524 Presbyopia: Secondary | ICD-10-CM | POA: Diagnosis not present

## 2019-11-09 DIAGNOSIS — H2513 Age-related nuclear cataract, bilateral: Secondary | ICD-10-CM | POA: Diagnosis not present

## 2019-11-09 DIAGNOSIS — H25013 Cortical age-related cataract, bilateral: Secondary | ICD-10-CM | POA: Diagnosis not present

## 2019-11-17 LAB — CUP PACEART INCLINIC DEVICE CHECK
Battery Remaining Longevity: 96 mo
Brady Statistic RA Percent Paced: 31 %
Brady Statistic RV Percent Paced: 2 %
Date Time Interrogation Session: 20210817163510
Implantable Lead Implant Date: 20151013
Implantable Lead Implant Date: 20151013
Implantable Lead Location: 753859
Implantable Lead Location: 753860
Implantable Lead Model: 4135
Implantable Lead Model: 4136
Implantable Lead Serial Number: 29480373
Implantable Lead Serial Number: 29608302
Implantable Pulse Generator Implant Date: 20151013
Lead Channel Pacing Threshold Amplitude: 0.5 V
Lead Channel Pacing Threshold Amplitude: 1.5 V
Lead Channel Pacing Threshold Pulse Width: 0.4 ms
Lead Channel Pacing Threshold Pulse Width: 1 ms
Lead Channel Sensing Intrinsic Amplitude: 7.8 mV
Lead Channel Sensing Intrinsic Amplitude: 8 mV
Lead Channel Setting Pacing Amplitude: 2 V
Lead Channel Setting Pacing Amplitude: 4 V
Lead Channel Setting Pacing Pulse Width: 1 ms
Lead Channel Setting Sensing Sensitivity: 2.5 mV
Pulse Gen Serial Number: 702951

## 2019-11-17 LAB — HM DIABETES EYE EXAM

## 2019-11-17 NOTE — Progress Notes (Signed)
Pacemaker check in clinic. Normal device function. Thresholds, sensing, impedances consistent with previous measurements. Device programmed to maximize longevity. AT/AF burden < 1 %, + Xarelto. 2 high ventricular rates noted with EGMs that show NSVT. Device programmed at appropriate safety margins. Histogram distribution appropriate for patient activity level. Device programmed to optimize intrinsic conduction .Previous transmissions showed R-wave undersensing. R-wave readings of 2-4 mV when RV lead tested in Bipolar mode with R-wave readings of 7 mV when tested in unipolar mode. RV pacing programmed bipolar and v- sensing programmed unipolar per Dr Sallyanne Kuster and industry. Estimated longevity 8 years. Patient enrolled in remote follow-up 01/14/20. Patient education completed. Follow up scheduled with Dr Sallyanne Kuster 02/07/20.

## 2019-11-24 ENCOUNTER — Encounter: Payer: Self-pay | Admitting: Internal Medicine

## 2019-11-24 ENCOUNTER — Telehealth: Payer: Self-pay

## 2019-11-24 NOTE — Telephone Encounter (Signed)
Spoke w/ [name] discussed 3rd booster dosage, he will discuss further w/ PCP at visit on 9/14. Also gave him the number to [facility] to schedule appt.

## 2019-12-07 ENCOUNTER — Other Ambulatory Visit: Payer: Self-pay

## 2019-12-07 ENCOUNTER — Encounter: Payer: Self-pay | Admitting: Internal Medicine

## 2019-12-07 ENCOUNTER — Ambulatory Visit (INDEPENDENT_AMBULATORY_CARE_PROVIDER_SITE_OTHER): Payer: Medicare Other | Admitting: Internal Medicine

## 2019-12-07 VITALS — BP 124/66 | HR 82 | Temp 98.1°F | Resp 18 | Ht 69.0 in | Wt 159.0 lb

## 2019-12-07 DIAGNOSIS — F419 Anxiety disorder, unspecified: Secondary | ICD-10-CM

## 2019-12-07 DIAGNOSIS — E119 Type 2 diabetes mellitus without complications: Secondary | ICD-10-CM | POA: Diagnosis not present

## 2019-12-07 DIAGNOSIS — F4321 Adjustment disorder with depressed mood: Secondary | ICD-10-CM | POA: Diagnosis not present

## 2019-12-07 DIAGNOSIS — F329 Major depressive disorder, single episode, unspecified: Secondary | ICD-10-CM

## 2019-12-07 DIAGNOSIS — F32A Depression, unspecified: Secondary | ICD-10-CM

## 2019-12-07 DIAGNOSIS — E785 Hyperlipidemia, unspecified: Secondary | ICD-10-CM

## 2019-12-07 DIAGNOSIS — I480 Paroxysmal atrial fibrillation: Secondary | ICD-10-CM

## 2019-12-07 DIAGNOSIS — E039 Hypothyroidism, unspecified: Secondary | ICD-10-CM | POA: Diagnosis not present

## 2019-12-07 MED ORDER — LEVOTHYROXINE SODIUM 75 MCG PO TABS: 75.0000 ug | ORAL_TABLET | Freq: Every day | ORAL | 1 refills | Status: DC

## 2019-12-07 MED ORDER — FREESTYLE LIBRE 14 DAY SENSOR MISC: 12 refills | Status: DC

## 2019-12-07 MED ORDER — SITAGLIPTIN PHOSPHATE 100 MG PO TABS: 100.0000 mg | ORAL_TABLET | Freq: Every day | ORAL | 1 refills | Status: DC

## 2019-12-07 MED ORDER — SERTRALINE HCL 100 MG PO TABS: 100.0000 mg | ORAL_TABLET | Freq: Two times a day (BID) | ORAL | 1 refills | Status: DC

## 2019-12-07 MED ORDER — DAPAGLIFLOZIN PROPANEDIOL 10 MG PO TABS: 10.0000 mg | ORAL_TABLET | Freq: Every day | ORAL | 1 refills | Status: DC

## 2019-12-07 MED ORDER — FREESTYLE LIBRE 14 DAY READER DEVI: 0 refills | Status: DC

## 2019-12-07 MED ORDER — METFORMIN HCL 1000 MG PO TABS: 1000.0000 mg | ORAL_TABLET | Freq: Two times a day (BID) | ORAL | 1 refills | Status: DC

## 2019-12-07 NOTE — Patient Instructions (Signed)
  GO TO THE LAB : Get the blood work     GO TO THE FRONT DESK, PLEASE SCHEDULE YOUR APPOINTMENTS Come back for a checkup in 4 months 

## 2019-12-07 NOTE — Progress Notes (Signed)
Subjective:    Patient ID: Stephen Robbins, male    DOB: February 27, 1936, 84 y.o.   MRN: [id number]  DOS:  12/07/2019 Type of visit - description: Routine visit In general feels well. All his chronic medical problems were reviewed. Still grieving the loss of his wife. Denies any suicidal ideas. No recent falls. Still anticoagulated, no gross hematuria or blood in the stools   Review of Systems See above   Past Medical History:  Diagnosis Date  . Arthritis    "joints; a little" (01/04/2014)  . Asymptomatic bilateral carotid artery stenosis    per duplex  05-15-2012  left >39%/   right 40-59%  . Basal cell carcinoma    nose  . Borderline diabetes   . BPH (benign prostatic hypertrophy) with urinary obstruction   . Bradycardia   . Bronchial pneumonia 1958  . Chronotropic incompetence with sinus node dysfunction (HCC)   . Compressed cervical disc   . Compression of lumbar vertebra (HCC)    L4 -- L5  . Dizzy   . Dysmetabolic syndrome   . Fatigue   . Frequency of urination   . GERD (gastroesophageal reflux disease)    occasional  . Hemorrhoids   . Hyperlipidemia   . Hypothyroidism   . Nocturia   . NSVT (nonsustained ventricular tachycardia) [facility])    cardiologist-  dr croitoru  . Pacemaker   . Urgency of urination   . Wears glasses     Past Surgical History:  Procedure Laterality Date  . BASAL CELL CARCINOMA EXCISION     nose  . CLOSED REDUCTION NASAL FRACTURE  09-01-2007  . CYSTOSCOPY N/A 12/28/2012   Procedure: CYSTOSCOPY;  Surgeon: Bernestine Amass, MD;  Location: [facility];  Service: Urology;  Laterality: N/A;  . EXERCISE TOLERENCE TEST  12-03-2012  DR CROITURO   CHRONOTROPIC INCOMPETENCE/ NORMAL RESTING BP W/ APPROPRIATE RESPONSE/ NO CHEST PAIN/ NO ST CHANGES FROM BASELINE  . INSERT / REPLACE / REMOVE PACEMAKER  01/04/2014   [employer] model L121 serial number [id number]  . LACERATION REPAIR Right 1978   middle finger  . NEUROPLASTY /  TRANSPOSITION ULNAR NERVE AT ELBOW Left 02-09-2010  . PERMANENT PACEMAKER INSERTION N/A 01/04/2014   Procedure: PERMANENT PACEMAKER INSERTION;  Surgeon: Sanda Klein, MD;  Location: [place] [facility];  Service: Cardiovascular;  Laterality: N/A;  . SKIN BIOPSY     scc  . TONSILLECTOMY AND ADENOIDECTOMY  1944  . TRANSURETHRAL INCISION OF PROSTATE N/A 12/28/2012   Procedure: TRANSURETHRAL INCISION OF THE PROSTATE (TUIP);  Surgeon: Bernestine Amass, MD;  Location: [facility];  Service: Urology;  Laterality: N/A;  . ULNAR NERVE TRANSPOSITION      Allergies as of 12/07/2019      Reactions   Penicillins Other (See Comments)   Unknown childhood reaction      Medication List       Accurate as of December 07, 2019 11:59 PM. If you have any questions, ask your nurse or doctor.        STOP taking these medications   hydrocortisone 2.5 % rectal cream Commonly known as: Proctozone-HC Stopped by: Kathlene November, MD     TAKE these medications   Accu-Chek FastClix Lancets Misc Check blood sugar three times daily   Accu-Chek Guide Me w/Device Kit 1 Device by Does not apply route as directed.   bisacodyl 5 MG EC tablet Commonly known as: DULCOLAX Take 5 mg daily as needed by mouth for  moderate constipation.   Blood Pressure Kit Take blood pressure twice weekly.   carbidopa-levodopa 25-100 MG tablet Commonly known as: SINEMET IR Take 2 tablets by mouth 3 (three) times daily.   dapagliflozin propanediol 10 MG Tabs tablet Commonly known as: Farxiga Take 1 tablet (10 mg total) by mouth daily.   FreeStyle Libre 14 Day Reader Kerrin Mo Check blood sugars daily as directed Started by: Kathlene November, MD   FreeStyle Libre 14 Day Sensor Misc Check blood sugars once daily as directed Started by: Kathlene November, MD   gabapentin 300 MG capsule Commonly known as: NEURONTIN TAKE 1 CAPSULE(300 MG) BY MOUTH TWICE DAILY   glucose blood test strip Commonly known as: Accu-Chek Guide Check blood sugar  three times daily   levothyroxine 75 MCG tablet Commonly known as: SYNTHROID Take 1 tablet (75 mcg total) by mouth daily before breakfast.   metFORMIN 1000 MG tablet Commonly known as: GLUCOPHAGE Take 1 tablet (1,000 mg total) by mouth 2 (two) times daily with a meal.   Myrbetriq 25 MG Tb24 tablet Generic drug: mirabegron ER Take 25 mg by mouth daily.   polyethylene glycol powder 17 GM/SCOOP powder Commonly known as: MiraLax Take 17 g daily by mouth.   sertraline 100 MG tablet Commonly known as: ZOLOFT Take 1 tablet (100 mg total) by mouth in the morning and at bedtime.   simvastatin 20 MG tablet Commonly known as: ZOCOR Take 1 tablet (20 mg total) by mouth at bedtime.   sitaGLIPtin 100 MG tablet Commonly known as: Januvia Take 1 tablet (100 mg total) by mouth daily.   Systane Ultra 0.4-0.3 % Soln Generic drug: Polyethyl Glycol-Propyl Glycol Place 1 drop into both eyes daily as needed (dry eyes).   vitamin B-12 1000 MCG tablet Commonly known as: CYANOCOBALAMIN Take 1,000 mcg by mouth every morning.   Xarelto 20 MG Tabs tablet Generic drug: rivaroxaban TAKE 1 TABLET(20 MG) BY MOUTH DAILY          Objective:   Physical Exam BP 124/66 (BP Location: Left Arm, Patient Position: Sitting, Cuff Size: Small)   Pulse 82   Temp 98.1 F (36.7 C) (Oral)   Resp 18   Ht 5' 9"  (1.753 m)   Wt 159 lb (72.1 kg)   SpO2 95%   BMI 23.48 kg/m  General:   Well developed, NAD, BMI noted. HEENT:  Normocephalic . Face symmetric, atraumatic Lungs:  CTA B Normal respiratory effort, no intercostal retractions, no accessory muscle use. Heart: Seems regular.  Lower extremities: no pretibial edema bilaterally  Skin: Not pale. Not jaundice Neurologic:  alert & oriented X3.  Speech normal, gait a walker Psych--  Cognition and judgment appear intact.  Cooperative with normal attention span and concentration.  Behavior appropriate. No anxious or depressed appearing.       Assessment     ASSESSMENT (New patient 11/18/17, transferring from Dr. Raliegh Ip) Diabetes DM Neuropathy Hypothyroidism Depression Hyperlipidemia CV: Paroxysmal A. fib, sick sinus syndrome,  pacemaker, NSVT, orthostatic hypotension, on Xarelto Hypogonadism NEURO: Dr [name] --Parkinson disease --Peripheral neuropathy Constipation (h/o impaction), alternates w/ diarrhea  Urology , urine incontinence   PLAN DM:  Ambulatory CBGs 107, 108.  Denies symptoms of low blood sugar. Currently on Farxiga, Metformin, Januvia.  Refill Farxiga, check A1c. Request a continuous glucose monitor, prescription sent Hypothyroidism: Well-controlled Depression: Currently on sertraline 200 mg daily, no suicidal ideas, still working on getting counseling. PHQ-9 today: 10 (mild). Hyperlipidemia: On simvastatin, check FLP Atrial fibrillation: Anticoagulated, check a CBC.  Regularly check by cardiology Parkinson, T.  D. Saw neurology 09/30/2019, no changes made Preventive care: 1 day after his second dose of the Covid vaccination 04/20/2019 he had a fall, felt like he could not move, needed help to get up, he felt weak this several following days.  No rash, itching, tongue or lip swelling.  Wondering if he is okay to get a Covid booster when available, I did recommend him to proceed, no evidence of anaphylaxis or serious allergic reaction. Multiple refills sent RTC 4 months    This visit occurred during the SARS-CoV-2 public health emergency.  Safety protocols were in place, including screening questions prior to the visit, additional usage of staff PPE, and extensive cleaning of exam room while observing appropriate contact time as indicated for disinfecting solutions.

## 2019-12-07 NOTE — Progress Notes (Signed)
Pre visit review using our clinic review tool, if applicable. No additional management support is needed unless otherwise documented below in the visit note. 

## 2019-12-08 LAB — LIPID PANEL
Cholesterol: 142 mg/dL (ref <200)
HDL: 33 mg/dL — ABNORMAL LOW (ref >=40)
LDL Cholesterol (Calc): 87 mg/dL (calc)
Non-HDL Cholesterol (Calc): 109 mg/dL (calc) (ref <130)
Total CHOL/HDL Ratio: 4.3 (calc) (ref <5.0)
Triglycerides: 128 mg/dL (ref <150)

## 2019-12-08 LAB — CBC WITH DIFFERENTIAL/PLATELET
Absolute Monocytes: 752 cells/uL (ref 200–950)
Basophils Absolute: 41 cells/uL (ref 0–200)
Basophils Relative: 0.6 %
Eosinophils Absolute: 104 cells/uL (ref 15–500)
Eosinophils Relative: 1.5 %
HCT: 42.2 % (ref 38.5–50.0)
Hemoglobin: 14.7 g/dL (ref 13.2–17.1)
Lymphs Abs: 1456 cells/uL (ref 850–3900)
MCH: 34 pg — ABNORMAL HIGH (ref 27.0–33.0)
MCHC: 34.8 g/dL (ref 32.0–36.0)
MCV: 97.7 fL (ref 80.0–100.0)
MPV: 9.8 fL (ref 7.5–12.5)
Monocytes Relative: 10.9 %
Neutro Abs: 4547 cells/uL (ref 1500–7800)
Neutrophils Relative %: 65.9 %
Platelets: 183 10*3/uL (ref 140–400)
RBC: 4.32 10*6/uL (ref 4.20–5.80)
RDW: 13.1 % (ref 11.0–15.0)
Total Lymphocyte: 21.1 %
WBC: 6.9 10*3/uL (ref 3.8–10.8)

## 2019-12-08 LAB — BASIC METABOLIC PANEL
BUN: 20 mg/dL (ref 7–25)
CO2: 25 mmol/L (ref 20–32)
Calcium: 9.9 mg/dL (ref 8.6–10.3)
Chloride: 104 mmol/L (ref 98–110)
Creat: 0.93 mg/dL (ref 0.70–1.11)
Glucose, Bld: 112 mg/dL — ABNORMAL HIGH (ref 65–99)
Potassium: 4.7 mmol/L (ref 3.5–5.3)
Sodium: 140 mmol/L (ref 135–146)

## 2019-12-08 LAB — HEMOGLOBIN A1C
Hgb A1c MFr Bld: 6.2 % of total Hgb — ABNORMAL HIGH (ref <5.7)
Mean Plasma Glucose: 131 (calc)
eAG (mmol/L): 7.3 (calc)

## 2019-12-08 NOTE — Assessment & Plan Note (Signed)
DM:  Ambulatory CBGs 107, 108.  Denies symptoms of low blood sugar. Currently on Farxiga, Metformin, Januvia.  Refill Farxiga, check A1c. Request a continuous glucose monitor, prescription sent Hypothyroidism: Well-controlled Depression: Currently on sertraline 200 mg daily, no suicidal ideas, still working on getting counseling. PHQ-9 today: 10 (mild). Hyperlipidemia: On simvastatin, check FLP Atrial fibrillation: Anticoagulated, check a CBC.  Regularly check by cardiology Parkinson, T.  D. Saw neurology 09/30/2019, no changes made Preventive care: 1 day after his second dose of the Covid vaccination 04/20/2019 he had a fall, felt like he could not move, needed help to get up, he felt weak this several following days.  No rash, itching, tongue or lip swelling.  Wondering if he is okay to get a Covid booster when available, I did recommend him to proceed, no evidence of anaphylaxis or serious allergic reaction. Multiple refills sent RTC 4 months

## 2019-12-18 ENCOUNTER — Other Ambulatory Visit: Payer: Self-pay | Admitting: Neurology

## 2019-12-30 ENCOUNTER — Other Ambulatory Visit: Payer: Self-pay | Admitting: Cardiovascular Disease

## 2019-12-31 NOTE — Telephone Encounter (Signed)
Prescription refill request for Xarelto received.  Indication:  Atrial Fibrillation Last office visit: 06/2019 Croitoru Weight:72.1 kg Age: 84 Scr: 0.93 11/2019 CrCl: 62.45 ml/min  Prescription refilled

## 2020-01-07 DIAGNOSIS — B351 Tinea unguium: Secondary | ICD-10-CM | POA: Diagnosis not present

## 2020-01-07 DIAGNOSIS — M79675 Pain in left toe(s): Secondary | ICD-10-CM | POA: Diagnosis not present

## 2020-01-07 DIAGNOSIS — M79674 Pain in right toe(s): Secondary | ICD-10-CM | POA: Diagnosis not present

## 2020-01-14 ENCOUNTER — Ambulatory Visit (INDEPENDENT_AMBULATORY_CARE_PROVIDER_SITE_OTHER): Payer: Medicare Other

## 2020-01-14 DIAGNOSIS — I495 Sick sinus syndrome: Secondary | ICD-10-CM | POA: Diagnosis not present

## 2020-01-18 DIAGNOSIS — Z23 Encounter for immunization: Secondary | ICD-10-CM | POA: Diagnosis not present

## 2020-01-18 LAB — CUP PACEART REMOTE DEVICE CHECK
Battery Remaining Longevity: 96 mo
Battery Remaining Percentage: 93 %
Brady Statistic RA Percent Paced: 26 %
Brady Statistic RV Percent Paced: 0 %
Date Time Interrogation Session: 20211025085900
Implantable Lead Implant Date: 20151013
Implantable Lead Implant Date: 20151013
Implantable Lead Location: 753859
Implantable Lead Location: 753860
Implantable Lead Model: 4135
Implantable Lead Model: 4136
Implantable Lead Serial Number: 29480373
Implantable Lead Serial Number: 29608302
Implantable Pulse Generator Implant Date: 20151013
Lead Channel Impedance Value: 535 Ohm
Lead Channel Impedance Value: 591 Ohm
Lead Channel Setting Pacing Amplitude: 2 V
Lead Channel Setting Pacing Amplitude: 4 V
Lead Channel Setting Pacing Pulse Width: 1 ms
Lead Channel Setting Sensing Sensitivity: 2 mV
Pulse Gen Serial Number: 702951

## 2020-01-19 NOTE — Progress Notes (Signed)
Remote pacemaker transmission.   

## 2020-02-07 ENCOUNTER — Encounter: Payer: Self-pay | Admitting: Cardiovascular Disease

## 2020-02-07 ENCOUNTER — Ambulatory Visit (INDEPENDENT_AMBULATORY_CARE_PROVIDER_SITE_OTHER): Payer: Medicare Other | Admitting: Cardiovascular Disease

## 2020-02-07 VITALS — BP 98/60 | HR 88 | Ht 69.0 in | Wt 156.4 lb

## 2020-02-07 DIAGNOSIS — Z7901 Long term (current) use of anticoagulants: Secondary | ICD-10-CM | POA: Diagnosis not present

## 2020-02-07 DIAGNOSIS — I495 Sick sinus syndrome: Secondary | ICD-10-CM

## 2020-02-07 DIAGNOSIS — I472 Ventricular tachycardia: Secondary | ICD-10-CM | POA: Diagnosis not present

## 2020-02-07 DIAGNOSIS — E785 Hyperlipidemia, unspecified: Secondary | ICD-10-CM

## 2020-02-07 DIAGNOSIS — I480 Paroxysmal atrial fibrillation: Secondary | ICD-10-CM | POA: Diagnosis not present

## 2020-02-07 DIAGNOSIS — I951 Orthostatic hypotension: Secondary | ICD-10-CM

## 2020-02-07 DIAGNOSIS — I4729 Other ventricular tachycardia: Secondary | ICD-10-CM

## 2020-02-07 DIAGNOSIS — Z950 Presence of cardiac pacemaker: Secondary | ICD-10-CM

## 2020-02-07 DIAGNOSIS — E119 Type 2 diabetes mellitus without complications: Secondary | ICD-10-CM | POA: Diagnosis not present

## 2020-02-07 NOTE — Patient Instructions (Signed)

## 2020-02-07 NOTE — Progress Notes (Signed)
Patient ID: Stephen Robbins, male   DOB: 01/26/1936, 84 y.o.   MRN: [id number]    Cardiology Office Note    Date:  02/07/2020   ID:  Stephen Robbins, DOB April 06, 1935, MRN [id number]  PCP:  Colon Branch, MD  Cardiologist:   Sanda Klein, MD   Chief Complaint  Patient presents with  . Atrial Fibrillation  . Pacemaker Check    History of Present Illness:  Stephen Robbins is a 84 y.o. male with asymptomatic paroxysmal atrial fibrillation detected on pacemaker downloads, sinus node dysfunction with chronotropic incompetence, Parkinson's disease, diabetes mellitus, asymptomatic brief nonsustained VT detected on pacemaker.  It has been just over a year since his wife passed away after a remarkably rapid cognitive decline.  He misses her very much.  He carries her photograph in his breast pocket at all times.  Although [name] lives alone, he reports having a lot of support from the other residents at [facility] and his friends.  He has had 4 falls.  He reports that 2 each happened shortly after he received the COVID-19 first and second shots.  He is convinced there is a relationship.  On each occasion the falls are related to severe muscle weakness.  He did not have dizziness, lightheadedness, loss of consciousness or visual changes.  He is very careful when he changes positions.  He uses a walker.  The patient specifically denies any chest pain at rest exertion, dyspnea at rest or with exertion, orthopnea, paroxysmal nocturnal dyspnea, syncope, palpitations, focal neurological deficits, intermittent claudication, lower extremity edema, unexplained weight gain, cough, hemoptysis or wheezing.  His device is a [employer] EL L121 (not MRI conditional) dual-chamber pacemaker implanted in 2015. Device check today shows normal function. Estimated generator longevity is 7.5 years. There is 23 % atrial pacing and less than 1 % ventricular pacing. Heart rate histogram distribution suggests appropriate  sensor settings to compensate for his chronotropic incompetence, especially taking into account his sedentary lifestyle.  His burden of atrial fibrillation is very low (less than 1%).  In the last few months this has been limited to a 23-minute episode that occurred in October 20.  His device reports several episodes of "nonsustained ventricular tachycardia", but all the episodes for which electrograms are available appear to be related to "noise" on the ventricular channel.  The ventricular over sensing can be easily reproduced with isometric chest muscle exercises. All the episodes are very brief and do not really interfere with normal device function.  I increased the ventricular sensing threshold to 4 mV with abolition of oversensing during isometric exercises.  His sensed R waves are 8-10 mV   Past Medical History:  Diagnosis Date  . Arthritis    "joints; a little" (01/04/2014)  . Asymptomatic bilateral carotid artery stenosis    per duplex  05-15-2012  left >39%/   right 40-59%  . Basal cell carcinoma    nose  . Borderline diabetes   . BPH (benign prostatic hypertrophy) with urinary obstruction   . Bradycardia   . Bronchial pneumonia 1958  . Chronotropic incompetence with sinus node dysfunction (HCC)   . Compressed cervical disc   . Compression of lumbar vertebra (HCC)    L4 -- L5  . Dizzy   . Dysmetabolic syndrome   . Fatigue   . Frequency of urination   . GERD (gastroesophageal reflux disease)    occasional  . Hemorrhoids   . Hyperlipidemia   . Hypothyroidism   .  Nocturia   . NSVT (nonsustained ventricular tachycardia) [facility])    cardiologist-  dr Tarrah Furuta  . Pacemaker   . Urgency of urination   . Wears glasses     Past Surgical History:  Procedure Laterality Date  . BASAL CELL CARCINOMA EXCISION     nose  . CLOSED REDUCTION NASAL FRACTURE  09-01-2007  . CYSTOSCOPY N/A 12/28/2012   Procedure: CYSTOSCOPY;  Surgeon: Bernestine Amass, MD;  Location: [facility];  Service: Urology;  Laterality: N/A;  . EXERCISE TOLERENCE TEST  12-03-2012  DR CROITURO   CHRONOTROPIC INCOMPETENCE/ NORMAL RESTING BP W/ APPROPRIATE RESPONSE/ NO CHEST PAIN/ NO ST CHANGES FROM BASELINE  . INSERT / REPLACE / REMOVE PACEMAKER  01/04/2014   [employer] model L121 serial number [id number]  . LACERATION REPAIR Right 1978   middle finger  . NEUROPLASTY / TRANSPOSITION ULNAR NERVE AT ELBOW Left 02-09-2010  . PERMANENT PACEMAKER INSERTION N/A 01/04/2014   Procedure: PERMANENT PACEMAKER INSERTION;  Surgeon: Sanda Klein, MD;  Location: [facility];  Service: Cardiovascular;  Laterality: N/A;  . SKIN BIOPSY     scc  . TONSILLECTOMY AND ADENOIDECTOMY  1944  . TRANSURETHRAL INCISION OF PROSTATE N/A 12/28/2012   Procedure: TRANSURETHRAL INCISION OF THE PROSTATE (TUIP);  Surgeon: Bernestine Amass, MD;  Location: [facility];  Service: Urology;  Laterality: N/A;  . ULNAR NERVE TRANSPOSITION      Current Medications: Outpatient Medications Prior to Visit  Medication Sig Dispense Refill  . ACCU-CHEK FASTCLIX LANCETS MISC Check blood sugar three times daily 300 each 12  . bisacodyl (DULCOLAX) 5 MG EC tablet Take 5 mg daily as needed by mouth for moderate constipation.    . Blood Glucose Monitoring Suppl (ACCU-CHEK GUIDE ME) w/Device KIT 1 Device by Does not apply route as directed. 1 kit 0  . Blood Pressure KIT Take blood pressure twice weekly. 1 kit 0  . carbidopa-levodopa (SINEMET IR) 25-100 MG tablet TAKE 2 TABLETS BY MOUTH THREE TIMES DAILY 540 tablet 1  . Continuous Blood Gluc Receiver (FREESTYLE LIBRE 14 DAY READER) DEVI Check blood sugars daily as directed 1 each 0  . Continuous Blood Gluc Sensor (FREESTYLE LIBRE 14 DAY SENSOR) MISC Check blood sugars once daily as directed 1 each 12  . dapagliflozin propanediol (FARXIGA) 10 MG TABS tablet Take 1 tablet (10 mg total) by mouth daily. 90 tablet 1  . gabapentin (NEURONTIN) 300 MG capsule TAKE 1  CAPSULE(300 MG) BY MOUTH TWICE DAILY 180 capsule 0  . glucose blood (ACCU-CHEK GUIDE) test strip Check blood sugar three times daily 300 each 12  . levothyroxine (SYNTHROID) 75 MCG tablet Take 1 tablet (75 mcg total) by mouth daily before breakfast. 90 tablet 1  . metFORMIN (GLUCOPHAGE) 1000 MG tablet Take 1 tablet (1,000 mg total) by mouth 2 (two) times daily with a meal. 180 tablet 1  . mirabegron ER (MYRBETRIQ) 25 MG TB24 tablet Take 25 mg by mouth daily.    Vladimir Faster Glycol-Propyl Glycol (SYSTANE ULTRA) 0.4-0.3 % SOLN Place 1 drop into both eyes daily as needed (dry eyes).     . polyethylene glycol powder (MIRALAX) powder Take 17 g daily by mouth. 255 g 0  . sertraline (ZOLOFT) 100 MG tablet Take 1 tablet (100 mg total) by mouth in the morning and at bedtime. 180 tablet 1  . simvastatin (ZOCOR) 20 MG tablet Take 1 tablet (20 mg total) by mouth at bedtime. 90 tablet 1  . sitaGLIPtin (  JANUVIA) 100 MG tablet Take 1 tablet (100 mg total) by mouth daily. 90 tablet 1  . vitamin B-12 (CYANOCOBALAMIN) 1000 MCG tablet Take 1,000 mcg by mouth every morning.     Alveda Reasons 20 MG TABS tablet TAKE 1 TABLET(20 MG) BY MOUTH DAILY 90 tablet 1   No facility-administered medications prior to visit.     Allergies:   Penicillins   Social History   Socioeconomic History  . Marital status: Widowed    Spouse name: Not on file  . Number of children: 0  . Years of education: Not on file  . Highest education level: Bachelor's degree (e.g., BA, AB, BS)  Occupational History  . Occupation: retired    Fish farm manager: RETIRED    Comment: executive   Tobacco Use  . Smoking status: Former Smoker    Packs/day: 1.00    Years: 10.00    Pack years: 10.00    Types: Cigarettes    Quit date: 03/25/1968    Years since quitting: 51.9  . Smokeless tobacco: Never Used  Vaping Use  . Vaping Use: Never used  Substance and Sexual Activity  . Alcohol use: Yes    Alcohol/week: 7.0 standard drinks    Types: 7 Glasses of  wine per week    Comment: 1 glass wine daily  . Drug use: No  . Sexual activity: Not Currently  Other Topics Concern  . Not on file  Social History Narrative   Lives at [facility] independent living   Left handed   Lives on 2nd level   Lost wife 01/21/2019, dementia   Social Determinants of Health   Financial Resource Strain:   . Difficulty of Paying Living Expenses: Not on file  Food Insecurity:   . Worried About Charity fundraiser in the Last Year: Not on file  . Ran Out of Food in the Last Year: Not on file  Transportation Needs:   . Lack of Transportation (Medical): Not on file  . Lack of Transportation (Non-Medical): Not on file  Physical Activity:   . Days of Exercise per Week: Not on file  . Minutes of Exercise per Session: Not on file  Stress:   . Feeling of Stress : Not on file  Social Connections:   . Frequency of Communication with Friends and Family: Not on file  . Frequency of Social Gatherings with Friends and Family: Not on file  . Attends Religious Services: Not on file  . Active Member of Clubs or Organizations: Not on file  . Attends Archivist Meetings: Not on file  . Marital Status: Not on file     Family History:  The patient's family history includes Heart Problems in his brother; Lung cancer in his mother; Parkinson's disease in his brother; Stroke in his father.   ROS:   Please see the history of present illness.    Review of Systems  Skin: Suspicious lesions: .mcrpexn.   All other systems are reviewed and are negative.   PHYSICAL EXAM:   VS:  BP 98/60   Pulse 88   Ht 5' 9"  (1.753 m)   Wt 156 lb 6.4 oz (70.9 kg)   SpO2 97%   BMI 23.10 kg/m     General: Alert, oriented x3, no distress, appears elderly and rather frail; healthy left subclavian pacemaker site Head: Right eye stye, no evidence of trauma, PERRL, EOMI, no exophtalmos or lid lag, no myxedema, no xanthelasma; normal ears, nose and oropharynx Neck: normal jugular venous  pulsations and no hepatojugular reflux; brisk carotid pulses without delay and no carotid bruits Chest: clear to auscultation, no signs of consolidation by percussion or palpation, normal fremitus, symmetrical and full respiratory excursions Cardiovascular: normal position and quality of the apical impulse, regular rhythm, normal first and widely split second heart sounds, no murmurs, rubs or gallops Abdomen: no tenderness or distention, no masses by palpation, no abnormal pulsatility or arterial bruits, normal bowel sounds, no hepatosplenomegaly Extremities: no clubbing, cyanosis or edema; 2+ radial, ulnar and brachial pulses bilaterally; 2+ right femoral, posterior tibial and dorsalis pedis pulses; 2+ left femoral, posterior tibial and dorsalis pedis pulses; no subclavian or femoral bruits Neurological: grossly nonfocal Psych: Normal mood and affect   Wt Readings from Last 3 Encounters:  02/07/20 156 lb 6.4 oz (70.9 kg)  12/07/19 159 lb (72.1 kg)  09/30/19 163 lb (73.9 kg)      Studies/Labs Reviewed:   EKG:  EKG is ordered today.  It is very similar to older tracings and shows atrial paced, ventricular sensed rhythm with old right bundle branch block.  Today he also has left axis deviation/left anterior fascicular block.  The AV delay slightly prolonged at 206 ms.  No ischemic repolarization changes are seen.  QTc 471 ms. Recent Labs: 05/05/2019: ALT 7 09/01/2019: TSH 2.62 12/07/2019: BUN 20; Creat 0.93; Hemoglobin 14.7; Platelets 183; Potassium 4.7; Sodium 140   Lipid Panel    Component Value Date/Time   CHOL 142 12/07/2019 1520   TRIG 128 12/07/2019 1520   TRIG 83 02/20/2006 0838   HDL 33 (L) 12/07/2019 1520   CHOLHDL 4.3 12/07/2019 1520   VLDL 21.0 01/19/2019 1450   LDLCALC 87 12/07/2019 1520     ASSESSMENT:    1. Paroxysmal atrial fibrillation (HCC)   2. SSS (sick sinus syndrome) ([place])   3. NSVT (nonsustained ventricular tachycardia) ([place])   4. Pacemaker - [employer] serial number [id number]   5. Orthostatic hypotension   6. Long term (current) use of anticoagulants   7. Dyslipidemia (high LDL; low HDL)   8. Diabetes mellitus type 2 in nonobese [facility])      PLAN:  In order of problems listed above:  1. Atrial fibrillation: Very low prevalence, well rate controlled, asymptomatic.  On anticoagulation, but needs to reassess bleeding risk periodically.Marland Kitchen CHADSVasc score 3 (age, DM).  Antiarrhythmics are not justified 2. SSS: Considering how sedentary he has become, his heart rate histograms are actually excellent. 3. NSVT: Artifactual, related to ventricular lead oversensing. 4. PPM: Continue remote downloads every 3 months and yearly office visits.  Ventricular lead sensing changed to 4 mV today, which hopefully will prevent most of the episodes of false nonsustained high ventricular rates. 5. Orthostatic hypotension: I suspect that this may be one of the causeS of his falls, rather than the Covid vaccines.  Reminded him that he needs to change positions very gradually and carefully.  Consider having a bedside commode.  Related to peripheral neuropathy, Parkinson's disease, use of SGLT2 inhibitor, etc. of course, his falls could also be related to the neurological disorder itself and deconditioning, poor balance. 6. Xarelto: Thankfully has not had any serious injuries with his falls.  Denies any serious bleeding complications.  I do not think we have yet reached a point where at the risk of the anticoagulant exceeds its benefit. 7. HLP: On statin, with lipid profile is in desirable range.  Low HDL unlikely to improve since he is unable to exercise more. 8.  DM: Hemoglobin A1c 6.2%, excellent control.   Medication Adjustments/Labs and Tests Ordered: Current medicines are reviewed at length with the patient today.  Concerns regarding medicines are outlined above.  Medication changes, Labs and Tests ordered today are listed in the Patient  Instructions below. Patient Instructions  Medication Instructions:  No changes *If you need a refill on your cardiac medications before your next appointment, please call your pharmacy*   Lab Work: None ordered If you have labs (blood work) drawn today and your tests are completely normal, you will receive your results only by: Marland Kitchen MyChart Message (if you have MyChart) OR . A paper copy in the mail If you have any lab test that is abnormal or we need to change your treatment, we will call you to review the results.   Testing/Procedures: None ordered   Follow-Up: At [facility], you and your health needs are our priority.  As part of our continuing mission to provide you with exceptional heart care, we have created designated Provider Care Teams.  These Care Teams include your primary Cardiologist (physician) and Advanced Practice Providers (APPs -  Physician Assistants and Nurse Practitioners) who all work together to provide you with the care you need, when you need it.  We recommend signing up for the patient portal called "MyChart".  Sign up information is provided on this After Visit Summary.  MyChart is used to connect with patients for Virtual Visits (Telemedicine).  Patients are able to view lab/test results, encounter notes, upcoming appointments, etc.  Non-urgent messages can be sent to your provider as well.   To learn more about what you can do with MyChart, go to [email or web address].    Your next appointment:   12 month(s)  The format for your next appointment:   In Person  Provider:   Sanda Klein, MD       Signed, Sanda Klein, MD  02/07/2020 3:32 PM    [facility] [address], [place], [place]  [address] Phone: [phone]; Fax: [phone]

## 2020-02-20 ENCOUNTER — Other Ambulatory Visit: Payer: Self-pay | Admitting: Neurology

## 2020-03-10 DIAGNOSIS — B351 Tinea unguium: Secondary | ICD-10-CM | POA: Diagnosis not present

## 2020-03-10 DIAGNOSIS — M79675 Pain in left toe(s): Secondary | ICD-10-CM | POA: Diagnosis not present

## 2020-03-10 DIAGNOSIS — M79674 Pain in right toe(s): Secondary | ICD-10-CM | POA: Diagnosis not present

## 2020-03-28 DIAGNOSIS — R2689 Other abnormalities of gait and mobility: Secondary | ICD-10-CM | POA: Diagnosis not present

## 2020-03-28 DIAGNOSIS — G20 Parkinson's disease: Secondary | ICD-10-CM | POA: Diagnosis not present

## 2020-03-28 DIAGNOSIS — M6281 Muscle weakness (generalized): Secondary | ICD-10-CM | POA: Diagnosis not present

## 2020-03-31 DIAGNOSIS — M6281 Muscle weakness (generalized): Secondary | ICD-10-CM | POA: Diagnosis not present

## 2020-03-31 DIAGNOSIS — G20 Parkinson's disease: Secondary | ICD-10-CM | POA: Diagnosis not present

## 2020-03-31 DIAGNOSIS — R2689 Other abnormalities of gait and mobility: Secondary | ICD-10-CM | POA: Diagnosis not present

## 2020-04-03 NOTE — Progress Notes (Signed)
Virtual Visit Via Video   The purpose of this virtual visit is to provide medical care while limiting exposure to the novel coronavirus.    Consent was obtained for video visit:  Yes.   Answered questions that patient had about telehealth interaction:  Yes.   I discussed the limitations, risks, security and privacy concerns of performing an evaluation and management service by telemedicine. I also discussed with the patient that there may be a patient responsible charge related to this service. The patient expressed understanding and agreed to proceed.  Pt location: Home Physician Location: office Name of referring provider:  Colon Branch, MD I connected with Stephen Robbins at patients initiation/request on 04/06/2020 at  3:00 PM EST by video enabled telemedicine application and verified that I am speaking with the correct person using two identifiers. Pt MRN:  [id number] Pt DOB:  07-Jan-1936 Video Participants:  Stephen Robbins;    Assessment/Plan:   1.  Parkinsons Disease  -Continue carbidopa/levodopa 25/100, 2 tablets 3 times per day  -Dyskinesia mild/mod and requires no medication.  -he and I discussed covid vaccine/booster and while it may make Parkinsons Disease sx's worse immediately after for a short time, we still strongly recommend it as many Parkinsons Disease patients will otherwise die from covid if they get the ds  -asks about prognosis and discussed prognostic features  2.  Diabetic peripheral neuropathy  -On gabapentin, 300 mg.  Patient believes he is only taking this 1 time per day.  3.  Depression  -On sertraline, 200 mg daily.  -asks for referral to [facility] and will refer.  Still having trouble with grief since death of wife.   Subjective:   Stephen Robbins was seen today in follow up for Parkinsons disease.  My previous records were reviewed prior to todays visit as well as outside records available to me.  Primary care records from September are reviewed.   Cardiology records from November reviewed.  Pt with several falls over the entire year (some we discussed last visit).  Several were related to covid vaccine or booster.  Pt denies  Consistent lightheadedness, near syncope.  No hallucinations.  Mood has been depressed.  Still grieving death of wife. Would like counseling.  Okay to do zoom.  He has restarted his exercise at [employer] 1.5 weeks ago.  Current prescribed movement disorder medications: Carbidopa/levodopa 25/100, 2 tablets 3 times per day Gabapentin, 300 mg, 1 tablet 1 x per day    ALLERGIES:   Allergies  Allergen Reactions  . Penicillins Other (See Comments)    Unknown childhood reaction    CURRENT MEDICATIONS:  Outpatient Encounter Medications as of 04/06/2020  Medication Sig  . ACCU-CHEK FASTCLIX LANCETS MISC Check blood sugar three times daily  . bisacodyl (DULCOLAX) 5 MG EC tablet Take 5 mg daily as needed by mouth for moderate constipation.  . Blood Glucose Monitoring Suppl (ACCU-CHEK GUIDE ME) w/Device KIT 1 Device by Does not apply route as directed.  . Blood Pressure KIT Take blood pressure twice weekly.  . carbidopa-levodopa (SINEMET IR) 25-100 MG tablet TAKE 2 TABLETS BY MOUTH THREE TIMES DAILY  . Continuous Blood Gluc Receiver (FREESTYLE LIBRE 14 DAY READER) DEVI Check blood sugars daily as directed  . Continuous Blood Gluc Sensor (FREESTYLE LIBRE 14 DAY SENSOR) MISC Check blood sugars once daily as directed  . dapagliflozin propanediol (FARXIGA) 10 MG TABS tablet Take 1 tablet (10 mg total) by mouth daily.  Marland Kitchen gabapentin (NEURONTIN) 300 MG  capsule TAKE 1 CAPSULE(300 MG) BY MOUTH TWICE DAILY  . glucose blood (ACCU-CHEK GUIDE) test strip Check blood sugar three times daily  . levothyroxine (SYNTHROID) 75 MCG tablet Take 1 tablet (75 mcg total) by mouth daily before breakfast.  . metFORMIN (GLUCOPHAGE) 1000 MG tablet Take 1 tablet (1,000 mg total) by mouth 2 (two) times daily with a meal.  . mirabegron ER (MYRBETRIQ)  25 MG TB24 tablet Take 25 mg by mouth daily.  Vladimir Faster Glycol-Propyl Glycol (SYSTANE ULTRA) 0.4-0.3 % SOLN Place 1 drop into both eyes daily as needed (dry eyes).   . polyethylene glycol powder (MIRALAX) powder Take 17 g daily by mouth.  . sertraline (ZOLOFT) 100 MG tablet Take 1 tablet (100 mg total) by mouth in the morning and at bedtime.  . simvastatin (ZOCOR) 20 MG tablet Take 1 tablet (20 mg total) by mouth at bedtime.  . sitaGLIPtin (JANUVIA) 100 MG tablet Take 1 tablet (100 mg total) by mouth daily.  . vitamin B-12 (CYANOCOBALAMIN) 1000 MCG tablet Take 1,000 mcg by mouth every morning.   Alveda Reasons 20 MG TABS tablet TAKE 1 TABLET(20 MG) BY MOUTH DAILY   No facility-administered encounter medications on file as of 04/06/2020.    Objective:   PHYSICAL EXAMINATION:    VITALS:  There were no vitals filed for this visit.  GEN:  The patient appears stated age and is in NAD.  Neurological examination:  Orientation: The patient is alert and oriented x3. Cranial nerves: There is good facial symmetry.  The speech is fluent and clear. Soft palate rises symmetrically and there is no tongue deviation. Hearing is intact to conversational tone. Motor: Strength is at least antigravity x 4.   Shoulder shrug is equal and symmetric.  There is no pronator drift.  Movement examination: Tone: unable Abnormal movements: mod axial dyskinesia and mild in the lips Coordination:  There is no decremation with RAM's, with any form of RAMS, including alternating supination and pronation of the forearm, hand opening and closing, finger taps bilaterally Gait and Station: unable per pt in current environment   I have reviewed and interpreted the following labs independently    Chemistry      Component Value Date/Time   NA 140 12/07/2019 1520   K 4.7 12/07/2019 1520   CL 104 12/07/2019 1520   CO2 25 12/07/2019 1520   BUN 20 12/07/2019 1520   CREATININE 0.93 12/07/2019 1520      Component Value  Date/Time   CALCIUM 9.9 12/07/2019 1520   ALKPHOS 47 05/05/2019 1201   AST 21 05/05/2019 1201   ALT 7 05/05/2019 1201   BILITOT 0.7 05/05/2019 1201       Lab Results  Component Value Date   WBC 6.9 12/07/2019   HGB 14.7 12/07/2019   HCT 42.2 12/07/2019   MCV 97.7 12/07/2019   PLT 183 12/07/2019    Lab Results  Component Value Date   TSH 2.62 09/01/2019    Follow up Instructions      -I discussed the assessment and treatment plan with the patient. The patient was provided an opportunity to ask questions and all were answered. The patient agreed with the plan and demonstrated an understanding of the instructions.   The patient was advised to call back or seek an in-person evaluation if the symptoms worsen or if the condition fails to improve as anticipated.    Total time spent on today's visit was 40mnutes, including both face-to-face time and nonface-to-face time.  Time included that spent on review of records (prior notes available to me/labs/imaging if pertinent), discussing treatment and goals, answering patient's questions and coordinating   Cc:  Colon Branch, MD

## 2020-04-04 DIAGNOSIS — G20 Parkinson's disease: Secondary | ICD-10-CM | POA: Diagnosis not present

## 2020-04-04 DIAGNOSIS — R2689 Other abnormalities of gait and mobility: Secondary | ICD-10-CM | POA: Diagnosis not present

## 2020-04-04 DIAGNOSIS — M6281 Muscle weakness (generalized): Secondary | ICD-10-CM | POA: Diagnosis not present

## 2020-04-05 ENCOUNTER — Encounter: Payer: Self-pay | Admitting: Internal Medicine

## 2020-04-05 ENCOUNTER — Telehealth (INDEPENDENT_AMBULATORY_CARE_PROVIDER_SITE_OTHER): Payer: Medicare Other | Admitting: Internal Medicine

## 2020-04-05 ENCOUNTER — Other Ambulatory Visit: Payer: Self-pay

## 2020-04-05 VITALS — Ht 69.0 in | Wt 150.0 lb

## 2020-04-05 DIAGNOSIS — F4321 Adjustment disorder with depressed mood: Secondary | ICD-10-CM

## 2020-04-05 DIAGNOSIS — Z79899 Other long term (current) drug therapy: Secondary | ICD-10-CM

## 2020-04-05 DIAGNOSIS — E119 Type 2 diabetes mellitus without complications: Secondary | ICD-10-CM

## 2020-04-05 DIAGNOSIS — F32A Depression, unspecified: Secondary | ICD-10-CM

## 2020-04-05 DIAGNOSIS — R296 Repeated falls: Secondary | ICD-10-CM | POA: Diagnosis not present

## 2020-04-05 DIAGNOSIS — F419 Anxiety disorder, unspecified: Secondary | ICD-10-CM

## 2020-04-05 MED ORDER — BUPROPION HCL 75 MG PO TABS
ORAL_TABLET | ORAL | 1 refills | Status: DC
Start: 2020-04-05 — End: 2020-10-13

## 2020-04-05 NOTE — Progress Notes (Signed)
Subjective:    Patient ID: Stephen Robbins, male    DOB: 1936/02/04, 85 y.o.   MRN: [id number]  DOS:  04/05/2020 Type of visit - description: Virtual Visit via Telephone    I connected with above mentioned patient  by telephone and verified that I am speaking with the correct person using two identifiers.  THIS ENCOUNTER IS A VIRTUAL VISIT DUE TO COVID-19 - PATIENT WAS NOT SEEN IN THE OFFICE. PATIENT HAS CONSENTED TO VIRTUAL VISIT / TELEMEDICINE VISIT   Location of patient: home  Location of provider: office  Persons participating in the virtual visit: patient, provider   I discussed the limitations, risks, security and privacy concerns of performing an evaluation and management service by telephone and the availability of in person appointments. I also discussed with the patient that there may be a patient responsible charge related to this service. The patient expressed understanding and agreed to proceed.  Follow-up Today we discussed several issues including frequent falls last year, the grieving process, diabetes, polypharmacy. He is concerned about the amount of medications that takes and likes to be sure all are  actually necessary.   Review of Systems See above   Past Medical History:  Diagnosis Date  . Arthritis    "joints; a little" (01/04/2014)  . Asymptomatic bilateral carotid artery stenosis    per duplex  05-15-2012  left >39%/   right 40-59%  . Basal cell carcinoma    nose  . Borderline diabetes   . BPH (benign prostatic hypertrophy) with urinary obstruction   . Bradycardia   . Bronchial pneumonia 1958  . Chronotropic incompetence with sinus node dysfunction (HCC)   . Compressed cervical disc   . Compression of lumbar vertebra (HCC)    L4 -- L5  . Dizzy   . Dysmetabolic syndrome   . Fatigue   . Frequency of urination   . GERD (gastroesophageal reflux disease)    occasional  . Hemorrhoids   . Hyperlipidemia   . Hypothyroidism   . Nocturia   . NSVT  (nonsustained ventricular tachycardia) [facility])    cardiologist-  dr croitoru  . Pacemaker   . Urgency of urination   . Wears glasses     Past Surgical History:  Procedure Laterality Date  . BASAL CELL CARCINOMA EXCISION     nose  . CLOSED REDUCTION NASAL FRACTURE  09-01-2007  . CYSTOSCOPY N/A 12/28/2012   Procedure: CYSTOSCOPY;  Surgeon: Bernestine Amass, MD;  Location: [facility];  Service: Urology;  Laterality: N/A;  . EXERCISE TOLERENCE TEST  12-03-2012  DR CROITURO   CHRONOTROPIC INCOMPETENCE/ NORMAL RESTING BP W/ APPROPRIATE RESPONSE/ NO CHEST PAIN/ NO ST CHANGES FROM BASELINE  . INSERT / REPLACE / REMOVE PACEMAKER  01/04/2014   [employer] model L121 serial number [id number]  . LACERATION REPAIR Right 1978   middle finger  . NEUROPLASTY / TRANSPOSITION ULNAR NERVE AT ELBOW Left 02-09-2010  . PERMANENT PACEMAKER INSERTION N/A 01/04/2014   Procedure: PERMANENT PACEMAKER INSERTION;  Surgeon: Sanda Klein, MD;  Location: [facility];  Service: Cardiovascular;  Laterality: N/A;  . SKIN BIOPSY     scc  . TONSILLECTOMY AND ADENOIDECTOMY  1944  . TRANSURETHRAL INCISION OF PROSTATE N/A 12/28/2012   Procedure: TRANSURETHRAL INCISION OF THE PROSTATE (TUIP);  Surgeon: Bernestine Amass, MD;  Location: [facility];  Service: Urology;  Laterality: N/A;  . ULNAR NERVE TRANSPOSITION      Allergies as of 04/05/2020  Reactions   Penicillins Other (See Comments)   Unknown childhood reaction      Medication List       Accurate as of April 05, 2020  2:24 PM. If you have any questions, ask your nurse or doctor.        STOP taking these medications   sitaGLIPtin 100 MG tablet Commonly known as: Januvia Stopped by: Kathlene November, MD     TAKE these medications   Accu-Chek FastClix Lancets Misc Check blood sugar three times daily   Accu-Chek Guide Me w/Device Kit 1 Device by Does not apply route as directed.   bisacodyl 5 MG EC tablet Commonly  known as: DULCOLAX Take 5 mg daily as needed by mouth for moderate constipation.   Blood Pressure Kit Take blood pressure twice weekly.   buPROPion 75 MG tablet Commonly known as: WELLBUTRIN 1 tablet a day for 10 days, then 1 tablet twice a day What changed:   how much to take  how to take this  when to take this  additional instructions Changed by: Kathlene November, MD   carbidopa-levodopa 25-100 MG tablet Commonly known as: SINEMET IR TAKE 2 TABLETS BY MOUTH THREE TIMES DAILY   dapagliflozin propanediol 10 MG Tabs tablet Commonly known as: Farxiga Take 1 tablet (10 mg total) by mouth daily.   FreeStyle Libre 14 Day Reader Kerrin Mo Check blood sugars daily as directed   FreeStyle Libre 14 Day Sensor Misc Check blood sugars once daily as directed   gabapentin 300 MG capsule Commonly known as: NEURONTIN TAKE 1 CAPSULE(300 MG) BY MOUTH TWICE DAILY   glucose blood test strip Commonly known as: Accu-Chek Guide Check blood sugar three times daily   levothyroxine 75 MCG tablet Commonly known as: SYNTHROID Take 1 tablet (75 mcg total) by mouth daily before breakfast.   metFORMIN 1000 MG tablet Commonly known as: GLUCOPHAGE Take 1 tablet (1,000 mg total) by mouth 2 (two) times daily with a meal.   mirabegron ER 25 MG Tb24 tablet Commonly known as: MYRBETRIQ Take 25 mg by mouth daily.   Polyethyl Glycol-Propyl Glycol 0.4-0.3 % Soln Place 1 drop into both eyes daily as needed (dry eyes).   polyethylene glycol powder 17 GM/SCOOP powder Commonly known as: MiraLax Take 17 g daily by mouth.   sertraline 100 MG tablet Commonly known as: ZOLOFT Take 1 tablet (100 mg total) by mouth daily. What changed: when to take this Changed by: Kathlene November, MD   simvastatin 20 MG tablet Commonly known as: ZOCOR Take 1 tablet (20 mg total) by mouth at bedtime.   vitamin B-12 1000 MCG tablet Commonly known as: CYANOCOBALAMIN Take 1,000 mcg by mouth every morning.   Xarelto 20 MG Tabs  tablet Generic drug: rivaroxaban TAKE 1 TABLET(20 MG) BY MOUTH DAILY          Objective:   Physical Exam Ht _0  (1.753 m)   Wt 150 lb (68 kg)   BMI 22.15 kg/m  This is a telephone virtual visit, he sounded well, in no apparent physical or emotional distress    Assessment     ASSESSMENT (New patient 11/18/17, transferring from Dr. Raliegh Ip) Diabetes DM Neuropathy Hypothyroidism Depression Hyperlipidemia CV: Paroxysmal A. fib, sick sinus syndrome,  pacemaker, NSVT, orthostatic hypotension, on Xarelto Hypogonadism NEURO: Dr Carles Collet --Parkinson disease --Peripheral neuropathy Constipation (h/o impaction), alternates w/ diarrhea  Urology , urine incontinence   PLAN Polypharmacy: He realizes he takes a number of medicines and would like to take only what  is strictly necessary.  See comments under diabetes and depression. DM: On Farxiga, metformin, Januvia.  Last A1c 6.2, okay to stop Januvia.  Recheck A1c on RTC Depression: Related to the loss of his wife, he continue with sadness, listening therapy provided, he self decrease sertraline from 100 mg twice daily to qd b/c  he was feeling sleepy.  He plans to see a counselor but he thinks he needs more help. We agreed to start Wellbutrin 75 mg 1 tablet daily, increase to B.I.D. in 10 days, prescription sent. Frequent falls: Currently uses a walker, started doing PT.  Denies any low BP or low CBG readings.  Encouraged to stay on the walker for as long as he needs (he likes to switch to a cane). Summary of changes sent to the patient. RTC: 2 months, patient will call and arrange    I discussed the assessment and treatment plan with the patient. The patient was provided an opportunity to ask questions and all were answered. The patient agreed with the plan and demonstrated an understanding of the instructions.   The patient was advised to call back or seek an in-person evaluation if the symptoms worsen or if the condition fails to improve as  anticipated.  I provided 24 minutes of non-face-to-face time during this encounter.  Kathlene November, MD

## 2020-04-05 NOTE — Progress Notes (Signed)
Pre visit review using our clinic review tool, if applicable. No additional management support is needed unless otherwise documented below in the visit note. 

## 2020-04-06 ENCOUNTER — Encounter: Payer: Self-pay | Admitting: Neurology

## 2020-04-06 ENCOUNTER — Other Ambulatory Visit: Payer: Self-pay

## 2020-04-06 ENCOUNTER — Telehealth (INDEPENDENT_AMBULATORY_CARE_PROVIDER_SITE_OTHER): Payer: Medicare Other | Admitting: Neurology

## 2020-04-06 VITALS — Ht 69.0 in | Wt 150.0 lb

## 2020-04-06 DIAGNOSIS — F330 Major depressive disorder, recurrent, mild: Secondary | ICD-10-CM

## 2020-04-06 DIAGNOSIS — F4321 Adjustment disorder with depressed mood: Secondary | ICD-10-CM

## 2020-04-06 DIAGNOSIS — G249 Dystonia, unspecified: Secondary | ICD-10-CM | POA: Diagnosis not present

## 2020-04-06 DIAGNOSIS — G20 Parkinson's disease: Secondary | ICD-10-CM | POA: Diagnosis not present

## 2020-04-06 NOTE — Assessment & Plan Note (Signed)
Polypharmacy: He realizes he takes a number of medicines and would like to take only what is strictly necessary.  See comments under diabetes and depression. DM: On Farxiga, metformin, Januvia.  Last A1c 6.2, okay to stop Januvia.  Recheck A1c on RTC Depression: Related to the loss of his wife, he continue with sadness, listening therapy provided, he self decrease sertraline from 100 mg twice daily to qd b/c  he was feeling sleepy.  He plans to see a counselor but he thinks he needs more help. We agreed to start Wellbutrin 75 mg 1 tablet daily, increase to B.I.D. in 10 days, prescription sent. Frequent falls: Currently uses a walker, started doing PT.  Denies any low BP or low CBG readings.  Encouraged to stay on the walker for as long as he needs (he likes to switch to a cane). Summary of changes sent to the patient. RTC: 2 months, patient will call and arrange

## 2020-04-07 DIAGNOSIS — R2689 Other abnormalities of gait and mobility: Secondary | ICD-10-CM | POA: Diagnosis not present

## 2020-04-07 DIAGNOSIS — M6281 Muscle weakness (generalized): Secondary | ICD-10-CM | POA: Diagnosis not present

## 2020-04-07 DIAGNOSIS — G20 Parkinson's disease: Secondary | ICD-10-CM | POA: Diagnosis not present

## 2020-04-07 NOTE — Addendum Note (Signed)
Addended by: Ulice Brilliant T on: 04/07/2020 10:45 AM   Modules accepted: Orders

## 2020-04-14 ENCOUNTER — Ambulatory Visit (INDEPENDENT_AMBULATORY_CARE_PROVIDER_SITE_OTHER): Payer: Medicare Other

## 2020-04-14 DIAGNOSIS — I495 Sick sinus syndrome: Secondary | ICD-10-CM | POA: Diagnosis not present

## 2020-04-14 LAB — CUP PACEART REMOTE DEVICE CHECK
Battery Remaining Longevity: 90 mo
Battery Remaining Percentage: 85 %
Brady Statistic RA Percent Paced: 26 %
Brady Statistic RV Percent Paced: 2 %
Date Time Interrogation Session: 20220121052800
Implantable Lead Implant Date: 20151013
Implantable Lead Implant Date: 20151013
Implantable Lead Location: 753859
Implantable Lead Location: 753860
Implantable Lead Model: 4135
Implantable Lead Model: 4136
Implantable Lead Serial Number: 29480373
Implantable Lead Serial Number: 29608302
Implantable Pulse Generator Implant Date: 20151013
Lead Channel Impedance Value: 502 Ohm
Lead Channel Impedance Value: 613 Ohm
Lead Channel Setting Pacing Amplitude: 2 V
Lead Channel Setting Pacing Amplitude: 4 V
Lead Channel Setting Pacing Pulse Width: 1 ms
Lead Channel Setting Sensing Sensitivity: 4 mV
Pulse Gen Serial Number: 702951

## 2020-04-18 DIAGNOSIS — R2689 Other abnormalities of gait and mobility: Secondary | ICD-10-CM | POA: Diagnosis not present

## 2020-04-18 DIAGNOSIS — G20 Parkinson's disease: Secondary | ICD-10-CM | POA: Diagnosis not present

## 2020-04-18 DIAGNOSIS — M6281 Muscle weakness (generalized): Secondary | ICD-10-CM | POA: Diagnosis not present

## 2020-04-21 DIAGNOSIS — G20 Parkinson's disease: Secondary | ICD-10-CM | POA: Diagnosis not present

## 2020-04-21 DIAGNOSIS — R2689 Other abnormalities of gait and mobility: Secondary | ICD-10-CM | POA: Diagnosis not present

## 2020-04-21 DIAGNOSIS — M6281 Muscle weakness (generalized): Secondary | ICD-10-CM | POA: Diagnosis not present

## 2020-04-25 DIAGNOSIS — G20 Parkinson's disease: Secondary | ICD-10-CM | POA: Diagnosis not present

## 2020-04-25 DIAGNOSIS — R2689 Other abnormalities of gait and mobility: Secondary | ICD-10-CM | POA: Diagnosis not present

## 2020-04-25 DIAGNOSIS — M6281 Muscle weakness (generalized): Secondary | ICD-10-CM | POA: Diagnosis not present

## 2020-04-26 NOTE — Progress Notes (Signed)
Remote pacemaker transmission.   

## 2020-04-28 DIAGNOSIS — M6281 Muscle weakness (generalized): Secondary | ICD-10-CM | POA: Diagnosis not present

## 2020-04-28 DIAGNOSIS — R2689 Other abnormalities of gait and mobility: Secondary | ICD-10-CM | POA: Diagnosis not present

## 2020-04-28 DIAGNOSIS — G20 Parkinson's disease: Secondary | ICD-10-CM | POA: Diagnosis not present

## 2020-05-02 DIAGNOSIS — G20 Parkinson's disease: Secondary | ICD-10-CM | POA: Diagnosis not present

## 2020-05-02 DIAGNOSIS — R2689 Other abnormalities of gait and mobility: Secondary | ICD-10-CM | POA: Diagnosis not present

## 2020-05-02 DIAGNOSIS — M6281 Muscle weakness (generalized): Secondary | ICD-10-CM | POA: Diagnosis not present

## 2020-05-05 DIAGNOSIS — R2689 Other abnormalities of gait and mobility: Secondary | ICD-10-CM | POA: Diagnosis not present

## 2020-05-05 DIAGNOSIS — M6281 Muscle weakness (generalized): Secondary | ICD-10-CM | POA: Diagnosis not present

## 2020-05-05 DIAGNOSIS — G20 Parkinson's disease: Secondary | ICD-10-CM | POA: Diagnosis not present

## 2020-05-06 ENCOUNTER — Other Ambulatory Visit: Payer: Self-pay | Admitting: Neurology

## 2020-05-08 DIAGNOSIS — M6281 Muscle weakness (generalized): Secondary | ICD-10-CM | POA: Diagnosis not present

## 2020-05-08 DIAGNOSIS — R2689 Other abnormalities of gait and mobility: Secondary | ICD-10-CM | POA: Diagnosis not present

## 2020-05-08 DIAGNOSIS — G20 Parkinson's disease: Secondary | ICD-10-CM | POA: Diagnosis not present

## 2020-05-08 NOTE — Telephone Encounter (Signed)
Rx(s) sent to pharmacy electronically.  

## 2020-05-11 DIAGNOSIS — M6281 Muscle weakness (generalized): Secondary | ICD-10-CM | POA: Diagnosis not present

## 2020-05-11 DIAGNOSIS — R2689 Other abnormalities of gait and mobility: Secondary | ICD-10-CM | POA: Diagnosis not present

## 2020-05-11 DIAGNOSIS — G20 Parkinson's disease: Secondary | ICD-10-CM | POA: Diagnosis not present

## 2020-05-16 DIAGNOSIS — G20 Parkinson's disease: Secondary | ICD-10-CM | POA: Diagnosis not present

## 2020-05-16 DIAGNOSIS — M6281 Muscle weakness (generalized): Secondary | ICD-10-CM | POA: Diagnosis not present

## 2020-05-16 DIAGNOSIS — R2689 Other abnormalities of gait and mobility: Secondary | ICD-10-CM | POA: Diagnosis not present

## 2020-05-18 DIAGNOSIS — M79675 Pain in left toe(s): Secondary | ICD-10-CM | POA: Diagnosis not present

## 2020-05-18 DIAGNOSIS — M6281 Muscle weakness (generalized): Secondary | ICD-10-CM | POA: Diagnosis not present

## 2020-05-18 DIAGNOSIS — B351 Tinea unguium: Secondary | ICD-10-CM | POA: Diagnosis not present

## 2020-05-18 DIAGNOSIS — R2689 Other abnormalities of gait and mobility: Secondary | ICD-10-CM | POA: Diagnosis not present

## 2020-05-18 DIAGNOSIS — M79674 Pain in right toe(s): Secondary | ICD-10-CM | POA: Diagnosis not present

## 2020-05-18 DIAGNOSIS — G20 Parkinson's disease: Secondary | ICD-10-CM | POA: Diagnosis not present

## 2020-05-22 DIAGNOSIS — M6281 Muscle weakness (generalized): Secondary | ICD-10-CM | POA: Diagnosis not present

## 2020-05-22 DIAGNOSIS — G20 Parkinson's disease: Secondary | ICD-10-CM | POA: Diagnosis not present

## 2020-05-22 DIAGNOSIS — R2689 Other abnormalities of gait and mobility: Secondary | ICD-10-CM | POA: Diagnosis not present

## 2020-05-25 DIAGNOSIS — M6281 Muscle weakness (generalized): Secondary | ICD-10-CM | POA: Diagnosis not present

## 2020-05-25 DIAGNOSIS — R2689 Other abnormalities of gait and mobility: Secondary | ICD-10-CM | POA: Diagnosis not present

## 2020-05-25 DIAGNOSIS — G20 Parkinson's disease: Secondary | ICD-10-CM | POA: Diagnosis not present

## 2020-05-29 DIAGNOSIS — G20 Parkinson's disease: Secondary | ICD-10-CM | POA: Diagnosis not present

## 2020-05-29 DIAGNOSIS — R2689 Other abnormalities of gait and mobility: Secondary | ICD-10-CM | POA: Diagnosis not present

## 2020-05-29 DIAGNOSIS — M6281 Muscle weakness (generalized): Secondary | ICD-10-CM | POA: Diagnosis not present

## 2020-06-01 DIAGNOSIS — R2689 Other abnormalities of gait and mobility: Secondary | ICD-10-CM | POA: Diagnosis not present

## 2020-06-01 DIAGNOSIS — M6281 Muscle weakness (generalized): Secondary | ICD-10-CM | POA: Diagnosis not present

## 2020-06-01 DIAGNOSIS — G20 Parkinson's disease: Secondary | ICD-10-CM | POA: Diagnosis not present

## 2020-06-05 DIAGNOSIS — R2689 Other abnormalities of gait and mobility: Secondary | ICD-10-CM | POA: Diagnosis not present

## 2020-06-05 DIAGNOSIS — G20 Parkinson's disease: Secondary | ICD-10-CM | POA: Diagnosis not present

## 2020-06-05 DIAGNOSIS — M6281 Muscle weakness (generalized): Secondary | ICD-10-CM | POA: Diagnosis not present

## 2020-06-08 ENCOUNTER — Encounter: Payer: Self-pay | Admitting: Internal Medicine

## 2020-06-08 DIAGNOSIS — M6281 Muscle weakness (generalized): Secondary | ICD-10-CM | POA: Diagnosis not present

## 2020-06-08 DIAGNOSIS — R2689 Other abnormalities of gait and mobility: Secondary | ICD-10-CM | POA: Diagnosis not present

## 2020-06-08 DIAGNOSIS — G20 Parkinson's disease: Secondary | ICD-10-CM | POA: Diagnosis not present

## 2020-06-12 DIAGNOSIS — R2689 Other abnormalities of gait and mobility: Secondary | ICD-10-CM | POA: Diagnosis not present

## 2020-06-12 DIAGNOSIS — G20 Parkinson's disease: Secondary | ICD-10-CM | POA: Diagnosis not present

## 2020-06-12 DIAGNOSIS — M6281 Muscle weakness (generalized): Secondary | ICD-10-CM | POA: Diagnosis not present

## 2020-06-13 ENCOUNTER — Ambulatory Visit: Payer: Medicare Other | Admitting: Internal Medicine

## 2020-06-13 DIAGNOSIS — Z0289 Encounter for other administrative examinations: Secondary | ICD-10-CM

## 2020-06-15 DIAGNOSIS — H25013 Cortical age-related cataract, bilateral: Secondary | ICD-10-CM | POA: Diagnosis not present

## 2020-06-15 DIAGNOSIS — H2513 Age-related nuclear cataract, bilateral: Secondary | ICD-10-CM | POA: Diagnosis not present

## 2020-06-16 DIAGNOSIS — G20 Parkinson's disease: Secondary | ICD-10-CM | POA: Diagnosis not present

## 2020-06-16 DIAGNOSIS — M6281 Muscle weakness (generalized): Secondary | ICD-10-CM | POA: Diagnosis not present

## 2020-06-16 DIAGNOSIS — R2689 Other abnormalities of gait and mobility: Secondary | ICD-10-CM | POA: Diagnosis not present

## 2020-06-19 DIAGNOSIS — M6281 Muscle weakness (generalized): Secondary | ICD-10-CM | POA: Diagnosis not present

## 2020-06-19 DIAGNOSIS — G20 Parkinson's disease: Secondary | ICD-10-CM | POA: Diagnosis not present

## 2020-06-19 DIAGNOSIS — R2689 Other abnormalities of gait and mobility: Secondary | ICD-10-CM | POA: Diagnosis not present

## 2020-06-22 DIAGNOSIS — R2689 Other abnormalities of gait and mobility: Secondary | ICD-10-CM | POA: Diagnosis not present

## 2020-06-22 DIAGNOSIS — M6281 Muscle weakness (generalized): Secondary | ICD-10-CM | POA: Diagnosis not present

## 2020-06-22 DIAGNOSIS — G20 Parkinson's disease: Secondary | ICD-10-CM | POA: Diagnosis not present

## 2020-06-26 DIAGNOSIS — M6281 Muscle weakness (generalized): Secondary | ICD-10-CM | POA: Diagnosis not present

## 2020-06-26 DIAGNOSIS — G20 Parkinson's disease: Secondary | ICD-10-CM | POA: Diagnosis not present

## 2020-06-26 DIAGNOSIS — R2689 Other abnormalities of gait and mobility: Secondary | ICD-10-CM | POA: Diagnosis not present

## 2020-06-29 DIAGNOSIS — R2689 Other abnormalities of gait and mobility: Secondary | ICD-10-CM | POA: Diagnosis not present

## 2020-06-29 DIAGNOSIS — G20 Parkinson's disease: Secondary | ICD-10-CM | POA: Diagnosis not present

## 2020-06-29 DIAGNOSIS — M6281 Muscle weakness (generalized): Secondary | ICD-10-CM | POA: Diagnosis not present

## 2020-07-04 DIAGNOSIS — G20 Parkinson's disease: Secondary | ICD-10-CM | POA: Diagnosis not present

## 2020-07-04 DIAGNOSIS — R2689 Other abnormalities of gait and mobility: Secondary | ICD-10-CM | POA: Diagnosis not present

## 2020-07-04 DIAGNOSIS — M6281 Muscle weakness (generalized): Secondary | ICD-10-CM | POA: Diagnosis not present

## 2020-07-06 DIAGNOSIS — M6281 Muscle weakness (generalized): Secondary | ICD-10-CM | POA: Diagnosis not present

## 2020-07-06 DIAGNOSIS — G20 Parkinson's disease: Secondary | ICD-10-CM | POA: Diagnosis not present

## 2020-07-06 DIAGNOSIS — R2689 Other abnormalities of gait and mobility: Secondary | ICD-10-CM | POA: Diagnosis not present

## 2020-07-07 ENCOUNTER — Other Ambulatory Visit: Payer: Self-pay | Admitting: Internal Medicine

## 2020-07-07 ENCOUNTER — Other Ambulatory Visit: Payer: Self-pay | Admitting: Cardiovascular Disease

## 2020-07-07 ENCOUNTER — Other Ambulatory Visit: Payer: Self-pay | Admitting: Neurology

## 2020-07-11 DIAGNOSIS — G20 Parkinson's disease: Secondary | ICD-10-CM | POA: Diagnosis not present

## 2020-07-11 DIAGNOSIS — M6281 Muscle weakness (generalized): Secondary | ICD-10-CM | POA: Diagnosis not present

## 2020-07-11 DIAGNOSIS — R2689 Other abnormalities of gait and mobility: Secondary | ICD-10-CM | POA: Diagnosis not present

## 2020-07-14 ENCOUNTER — Ambulatory Visit (INDEPENDENT_AMBULATORY_CARE_PROVIDER_SITE_OTHER): Payer: Medicare Other

## 2020-07-14 DIAGNOSIS — I495 Sick sinus syndrome: Secondary | ICD-10-CM

## 2020-07-14 DIAGNOSIS — G20 Parkinson's disease: Secondary | ICD-10-CM | POA: Diagnosis not present

## 2020-07-14 DIAGNOSIS — M6281 Muscle weakness (generalized): Secondary | ICD-10-CM | POA: Diagnosis not present

## 2020-07-14 DIAGNOSIS — R2689 Other abnormalities of gait and mobility: Secondary | ICD-10-CM | POA: Diagnosis not present

## 2020-07-17 DIAGNOSIS — G20 Parkinson's disease: Secondary | ICD-10-CM | POA: Diagnosis not present

## 2020-07-17 DIAGNOSIS — R2689 Other abnormalities of gait and mobility: Secondary | ICD-10-CM | POA: Diagnosis not present

## 2020-07-17 DIAGNOSIS — M6281 Muscle weakness (generalized): Secondary | ICD-10-CM | POA: Diagnosis not present

## 2020-07-17 LAB — CUP PACEART REMOTE DEVICE CHECK
Battery Remaining Longevity: 84 mo
Battery Remaining Percentage: 83 %
Brady Statistic RA Percent Paced: 31 %
Brady Statistic RV Percent Paced: 2 %
Date Time Interrogation Session: 20220423081500
Implantable Lead Implant Date: 20151013
Implantable Lead Implant Date: 20151013
Implantable Lead Location: 753859
Implantable Lead Location: 753860
Implantable Lead Model: 4135
Implantable Lead Model: 4136
Implantable Lead Serial Number: 29480373
Implantable Lead Serial Number: 29608302
Implantable Pulse Generator Implant Date: 20151013
Lead Channel Impedance Value: 495 Ohm
Lead Channel Impedance Value: 591 Ohm
Lead Channel Setting Pacing Amplitude: 2 V
Lead Channel Setting Pacing Amplitude: 4 V
Lead Channel Setting Pacing Pulse Width: 1 ms
Lead Channel Setting Sensing Sensitivity: 4 mV
Pulse Gen Serial Number: 702951

## 2020-07-18 DIAGNOSIS — M79675 Pain in left toe(s): Secondary | ICD-10-CM | POA: Diagnosis not present

## 2020-07-18 DIAGNOSIS — M79674 Pain in right toe(s): Secondary | ICD-10-CM | POA: Diagnosis not present

## 2020-07-18 DIAGNOSIS — B351 Tinea unguium: Secondary | ICD-10-CM | POA: Diagnosis not present

## 2020-07-24 DIAGNOSIS — R2689 Other abnormalities of gait and mobility: Secondary | ICD-10-CM | POA: Diagnosis not present

## 2020-07-24 DIAGNOSIS — G20 Parkinson's disease: Secondary | ICD-10-CM | POA: Diagnosis not present

## 2020-07-24 DIAGNOSIS — M6281 Muscle weakness (generalized): Secondary | ICD-10-CM | POA: Diagnosis not present

## 2020-07-27 DIAGNOSIS — G20 Parkinson's disease: Secondary | ICD-10-CM | POA: Diagnosis not present

## 2020-07-27 DIAGNOSIS — R2689 Other abnormalities of gait and mobility: Secondary | ICD-10-CM | POA: Diagnosis not present

## 2020-07-27 DIAGNOSIS — M6281 Muscle weakness (generalized): Secondary | ICD-10-CM | POA: Diagnosis not present

## 2020-07-31 DIAGNOSIS — M6281 Muscle weakness (generalized): Secondary | ICD-10-CM | POA: Diagnosis not present

## 2020-07-31 DIAGNOSIS — G20 Parkinson's disease: Secondary | ICD-10-CM | POA: Diagnosis not present

## 2020-07-31 DIAGNOSIS — R2689 Other abnormalities of gait and mobility: Secondary | ICD-10-CM | POA: Diagnosis not present

## 2020-08-01 NOTE — Progress Notes (Signed)
Remote pacemaker transmission.   

## 2020-08-03 DIAGNOSIS — R2689 Other abnormalities of gait and mobility: Secondary | ICD-10-CM | POA: Diagnosis not present

## 2020-08-03 DIAGNOSIS — G20 Parkinson's disease: Secondary | ICD-10-CM | POA: Diagnosis not present

## 2020-08-03 DIAGNOSIS — M6281 Muscle weakness (generalized): Secondary | ICD-10-CM | POA: Diagnosis not present

## 2020-08-04 DIAGNOSIS — H40003 Preglaucoma, unspecified, bilateral: Secondary | ICD-10-CM | POA: Diagnosis not present

## 2020-08-04 DIAGNOSIS — E119 Type 2 diabetes mellitus without complications: Secondary | ICD-10-CM | POA: Diagnosis not present

## 2020-08-04 DIAGNOSIS — H2513 Age-related nuclear cataract, bilateral: Secondary | ICD-10-CM | POA: Diagnosis not present

## 2020-08-04 DIAGNOSIS — H25013 Cortical age-related cataract, bilateral: Secondary | ICD-10-CM | POA: Diagnosis not present

## 2020-08-04 DIAGNOSIS — H5703 Miosis: Secondary | ICD-10-CM | POA: Diagnosis not present

## 2020-08-04 DIAGNOSIS — Z7984 Long term (current) use of oral hypoglycemic drugs: Secondary | ICD-10-CM | POA: Diagnosis not present

## 2020-08-04 DIAGNOSIS — G20 Parkinson's disease: Secondary | ICD-10-CM | POA: Diagnosis not present

## 2020-08-04 DIAGNOSIS — H04123 Dry eye syndrome of bilateral lacrimal glands: Secondary | ICD-10-CM | POA: Diagnosis not present

## 2020-08-10 DIAGNOSIS — G20 Parkinson's disease: Secondary | ICD-10-CM | POA: Diagnosis not present

## 2020-08-10 DIAGNOSIS — R2689 Other abnormalities of gait and mobility: Secondary | ICD-10-CM | POA: Diagnosis not present

## 2020-08-10 DIAGNOSIS — M6281 Muscle weakness (generalized): Secondary | ICD-10-CM | POA: Diagnosis not present

## 2020-08-15 DIAGNOSIS — R2689 Other abnormalities of gait and mobility: Secondary | ICD-10-CM | POA: Diagnosis not present

## 2020-08-15 DIAGNOSIS — G20 Parkinson's disease: Secondary | ICD-10-CM | POA: Diagnosis not present

## 2020-08-15 DIAGNOSIS — M6281 Muscle weakness (generalized): Secondary | ICD-10-CM | POA: Diagnosis not present

## 2020-08-17 DIAGNOSIS — R2689 Other abnormalities of gait and mobility: Secondary | ICD-10-CM | POA: Diagnosis not present

## 2020-08-17 DIAGNOSIS — G20 Parkinson's disease: Secondary | ICD-10-CM | POA: Diagnosis not present

## 2020-08-17 DIAGNOSIS — M6281 Muscle weakness (generalized): Secondary | ICD-10-CM | POA: Diagnosis not present

## 2020-08-17 NOTE — Progress Notes (Signed)
Assessment/Plan:   1.  Parkinsons Disease  -continue carbidopa/levodopa 25/100, 2 tablets 3 times per day  -Has some dyskinesia, but not that bothersome and does not require medication.  -met with my lcsw today  2.  Diabetic peripheral neuropathy  -On gabapentin, 300 mg bid  3.  Depression  -On sertraline, 200 mg daily   Subjective:   Stephen Robbins was seen today in follow up for Parkinsons disease.  My previous records were reviewed prior to todays visit as well as outside records available to me. Pt with a few falls - one in a parking lot - didn't have walker and fell on knee.  He is doing PT 2 days per week.  The gym is back open at [employer] and he is using that.  Pt with one episode of lightheadedness but no near syncope.  No hallucinations.  Mood has been fair.  Ophthalmology notes are reviewed.  Saw ophthalmology on May 13.  Discussed removal of cataract.  This is scheduled for June 13 and June 27.   ALLERGIES:   Allergies  Allergen Reactions  . Penicillins Other (See Comments)    Unknown childhood reaction    CURRENT MEDICATIONS:  Outpatient Encounter Medications as of 08/18/2020  Medication Sig  . ACCU-CHEK FASTCLIX LANCETS MISC Check blood sugar three times daily  . bisacodyl (DULCOLAX) 5 MG EC tablet Take 5 mg daily as needed by mouth for moderate constipation.  . Blood Glucose Monitoring Suppl (ACCU-CHEK GUIDE ME) w/Device KIT 1 Device by Does not apply route as directed.  . Blood Pressure KIT Take blood pressure twice weekly.  Marland Kitchen buPROPion (WELLBUTRIN) 75 MG tablet 1 tablet a day for 10 days, then 1 tablet twice a day (Patient taking differently: 1 tablet a day for 10 days)  . carbidopa-levodopa (SINEMET IR) 25-100 MG tablet TAKE 2 TABLETS BY MOUTH THREE TIMES DAILY  . Continuous Blood Gluc Receiver (FREESTYLE LIBRE 14 DAY READER) DEVI Check blood sugars daily as directed  . Continuous Blood Gluc Sensor (FREESTYLE LIBRE 14 DAY SENSOR) MISC Check blood sugars once  daily as directed  . dapagliflozin propanediol (FARXIGA) 10 MG TABS tablet Take 1 tablet (10 mg total) by mouth daily.  Marland Kitchen gabapentin (NEURONTIN) 300 MG capsule TAKE 1 CAPSULE(300 MG) BY MOUTH TWICE DAILY  . glucose blood (ACCU-CHEK GUIDE) test strip Check blood sugar three times daily  . levothyroxine (SYNTHROID) 75 MCG tablet Take 1 tablet (75 mcg total) by mouth daily before breakfast.  . metFORMIN (GLUCOPHAGE) 1000 MG tablet Take 1 tablet (1,000 mg total) by mouth 2 (two) times daily with a meal.  . mirabegron ER (MYRBETRIQ) 25 MG TB24 tablet Take 25 mg by mouth daily.  Vladimir Faster Glycol-Propyl Glycol 0.4-0.3 % SOLN Place 1 drop into both eyes daily as needed (dry eyes).  . polyethylene glycol powder (MIRALAX) powder Take 17 g daily by mouth.  . sertraline (ZOLOFT) 100 MG tablet Take 1 tablet (100 mg total) by mouth daily.  . simvastatin (ZOCOR) 20 MG tablet Take 1 tablet (20 mg total) by mouth at bedtime.  . tamsulosin (FLOMAX) 0.4 MG CAPS capsule Take 0.4 mg by mouth daily.  . vitamin B-12 (CYANOCOBALAMIN) 1000 MCG tablet Take 1,000 mcg by mouth every morning.   Alveda Reasons 20 MG TABS tablet TAKE 1 TABLET(20 MG) BY MOUTH DAILY   No facility-administered encounter medications on file as of 08/18/2020.    Objective:   PHYSICAL EXAMINATION:    VITALS:   Vitals:  08/18/20 1417  BP: 112/64  Pulse: 68  SpO2: 97%  Weight: 154 lb (69.9 kg)  Height: _0  (1.753 m)   Wt Readings from Last 3 Encounters:  08/18/20 154 lb (69.9 kg)  04/06/20 150 lb (68 kg)  04/05/20 150 lb (68 kg)     GEN:  The patient appears stated age and is in NAD. HEENT:  Normocephalic, atraumatic.  The mucous membranes are moist. The superficial temporal arteries are without ropiness or tenderness. CV:  RRR Lungs:  CTAB Neck/HEME:  There are no carotid bruits bilaterally.  Neurological examination:  Orientation: The patient is alert and oriented x3. Cranial nerves: There is good facial symmetry with  facial hypomimia. The speech is fluent and clear but hypophonic. Soft palate rises symmetrically and there is no tongue deviation. Hearing is intact to conversational tone. Sensation: Sensation is intact to light touch throughout Motor: Strength is at least antigravity x4.  Movement examination: Tone: There is nl tone in the UE/LE Abnormal movements: there is LUE rest tremor. Coordination:  There is no decremation with RAM's Gait and Station: The patient has no difficulty arising out of a deep-seated chair without the use of the hands. The patient's stride length is good with the walker.    I have reviewed and interpreted the following labs independently    Chemistry      Component Value Date/Time   NA 140 12/07/2019 1520   K 4.7 12/07/2019 1520   CL 104 12/07/2019 1520   CO2 25 12/07/2019 1520   BUN 20 12/07/2019 1520   CREATININE 0.93 12/07/2019 1520      Component Value Date/Time   CALCIUM 9.9 12/07/2019 1520   ALKPHOS 47 05/05/2019 1201   AST 21 05/05/2019 1201   ALT 7 05/05/2019 1201   BILITOT 0.7 05/05/2019 1201       Lab Results  Component Value Date   WBC 6.9 12/07/2019   HGB 14.7 12/07/2019   HCT 42.2 12/07/2019   MCV 97.7 12/07/2019   PLT 183 12/07/2019    Lab Results  Component Value Date   TSH 2.62 09/01/2019     Total time spent on today's visit was 20 minutes, including both face-to-face time and nonface-to-face time.  Time included that spent on review of records (prior notes available to me/labs/imaging if pertinent), discussing treatment and goals, answering patient's questions and coordinating care.  Cc:  Colon Branch, MD

## 2020-08-18 ENCOUNTER — Other Ambulatory Visit: Payer: Self-pay

## 2020-08-18 ENCOUNTER — Encounter: Payer: Self-pay | Admitting: Neurology

## 2020-08-18 ENCOUNTER — Ambulatory Visit (INDEPENDENT_AMBULATORY_CARE_PROVIDER_SITE_OTHER): Payer: Medicare Other | Admitting: Neurology

## 2020-08-18 VITALS — BP 112/64 | HR 68 | Ht 69.0 in | Wt 154.0 lb

## 2020-08-18 DIAGNOSIS — G20 Parkinson's disease: Secondary | ICD-10-CM | POA: Diagnosis not present

## 2020-08-18 NOTE — Patient Instructions (Signed)
Online Resources for Power over Parkinson's Group May 2022  . Local [place] Online Groups  o Power over Parkinson's Group :   - Power Over Parkinson's Patient Education Group will be Wednesday, May 11th at 2pm via Zoom.   - Upcoming Power over Parkinson's Meetings:  2nd Wednesdays of the month at 2 pm:  June 8th, July 13th - Contact Amy Marriott at [email or web address] if interested in participating in this online group o Parkinson's Care Partners Group:    3rd Mondays, Contact Misty Paladino o Atypical Parkinsonian Patient Group:   4th Wednesdays, Contact Misty Paladino o If you are interested in participating in these online groups with Misty, please contact her directly for how to join those meetings.  Her contact information is [email or web address].   . Parkinson Foundation:  www.parkinson.org o PD Health at Home continues:  Mindfulness Mondays, Expert Briefing Tuesdays, Wellness Wednesdays, Take Time Thursdays, Fitness Fridays -Listings for May 2022 are on the website o Upcoming Webinar:  Newly Diagnosed Building a Better Life with Parkinson   Wednesday, May 18th @ 1 pm o Register for expert briefings (webinars) at [email or web address] o  Please check out their website to sign up for emails and see their full online offerings  . [employer] Foundation:  www.[email or web address]  o Upcoming Webinar:   What's in your DNA, understanding Parkinson's genetics.  Thursday, May 19th @ 12 noon o Check out additional information on their website to see their full online offerings  . [place] Phinney Foundation:  www.[email or web address].org o Upcoming Webinar:  Stay tuned o Care Partner Monthly Meetup.  With Connie Carpenter Phinney.  First Tuesday of each month, 2 pm o Joy Breaks:  First Wednesday of each month, 2-3 pm. There will be art, doodling, making, crafting, listening, laughing, stories, and everything in between. No art experience necessary. No supplies  required. Just show up for joy!  Register on their website. o Check out additional information to Live Well Today on their website  . Parkinson and Movement Disorders (PMD) Alliance:  www.pmdalliance.org o [employer] Online:  Online Education Events o Sign up for emails, which are sent weekly to give you updates on programming and online offerings     . Parkinson's Association of [place]:  www.parkinsonassociation.org o Information on online support groups, education events, and online exercises including Yoga, Parkinson's exercises and more-LOTS of information on links to PD resources and online events o Virtual Support Group through Parkinson's Association of [place]; next one is scheduled for Wednesday, May 4th, 2022 at 2 pm. (These are typically scheduled for the 1st Wednesday of the month at 2 pm).  Visit website for details.  . Additional links for movement activities: o PWR! Moves Classes at [facility] Exercise Room HAVE RESUMED!  Wednesdays 10 and 11 am.  Contact Amy Marriott, PT [email or web address] or [phone] if interested o Here is a link to the PWR!Moves classes on Zoom from [place] Parkinson's Foundation - Daily Mon-Sat at 10:00. Via Zoom, FREE and open to all.  There is also a link below via Facebook if you use that platform. - https://www.[email or web address]/mpf-programs/exercise-and-movement-activities - https://www.facebook.com/[email or web address]/posts/pwr-moves-exercise-class-parkinson-wellness-recovery-online-with-angee-ludwa-pt-/10156827878021813/ o Parkinson's Wellness Recovery (PWR! Moves)  www.pwr4life.org - Info on the PWR! Virtual Experience:  You will have access to our expertise through self-assessment, guided plans that start with the PD-specific fundamentals, educational content, tips, Q&A with an expert, and a growing library of PD-specific pre-recorded and live exercise classes of varying types and intensity - both physical and cognitive! If  that is not enough, we   offer 1:1 wellness consultations (in-person or virtual) to personalize your PWR! Research scientist (medical).  - Check out the PWR! Move of the month on the [place] [facility] website:  [email or web address].[email or web address]-[email or web address]/ o [employer] Fridays:  - As part of the PD Health @ Home program, this free video series focuses each week on one aspect of fitness designed to support people living with Parkinson's.  These weekly videos highlight the [place] recent fitness guidelines for people with Parkinson's disease. -  HollywoodSale.dk o Dance for PD website is offering free, live-stream classes throughout the week, as well as links to [employer] of classes:  https://danceforparkinsons.org/ o Dance for Parkinson's Class:  [place].  Free offering for people with Parkinson's and care partners; virtual class.  o For more information, contact [phone] or email Ruffin Frederick at [email or web address] o Virtual dance and Pilates for Parkinson's classes: Click on the Community Tab> Parkinson's Movement Initiative Tab.  To register for classes and for more information, visit www.[email or web address].co.za and click the "community" tab.     o [employer] Parkinson's Cycling Classes  - [facility]: 1pm on Fridays-Live classes at [employer] ([employer] at [email or web address] or [phone]) [facility] [employer]: Virtual Classes Mondays and Thursdays Jeanette Caprice classes Tuesday, Wednesday and Thursday (contact [place] at [email or web address]  or [phone])  o Alfred levels of classes are offered Tuesdays and Thursdays:  10:30 am,  12 noon & 1:45 pm at [facility].  - Active Stretching with Paula Compton Class starting in March, on Fridays - To observe a class or for  more information,  call [phone] or email [email or web address] . [employer]: o Chief Technology Officer Opportunities:  www.well-springsolutions.org/caregiver-education/caregiver-support-group.  You may also contact Vickki Muff at [email or web address] or [phone].   o [facility] for Family Caregivers! Thursday, May 12th 10:00a-1:30p [place] 04-21-1996, [employer], [place], [place] You may contact [address] at [email or web address] or [phone].   o Well-Spring Navigator:  Just1Navigator program, a free service to help individuals and families through the journey of determining care for older adults.  The "Navigator" is a 05-25-1986, [phone], who will speak with a prospective client and/or loved ones to provide an assessment of the situation and a set of recommendations for a personalized care plan -- all free of charge, and whether [employer] offers the needed service or not. If the need is not a service we provide, we are well-connected with reputable programs in town that we can refer you to.  www.well-springsolutions.org or to speak with the Navigator, call [phone].     T-Shirt design contest   SUBMIT UNIQUE DESIGN TO WIN!  Winning design will be featured on T-shirts to support our local Parkinson's patients.   Contest rules and information in on link below .   Contest ends on August 31,2022    How to [phone] your own unique quote or a design that will stand out and support the ideas behind Parkinson's Awareness  This contest is open to everyone. The winning design or quote will be selected by a group of unbiased judges. Judges will vote based on quality, relevance, and aesthetic. Judges will not have access to the names associated with the submission. Submissions accepted until: November 22, 2020 What You Win Your own quote or design will be printed on a t-shirt that will support Parkinson's Awareness!!  Feel good knowing your quote/artwork will help spread awareness for Parkinson's contribute and 100%  of profits to the local area . Tips for Submissions We will accept submissions in the form of quotes, Financial risk analyst, or hand drawn artwork.  Mission/Theme: The Power of One ! Working together to support and treat to improve quality of life for Parkinson's Patients Rules & Regulations All designs and quotes must be original and not subject to copyright of another person or entity. Distribution and Reproduction Rights for all quotes and artwork will be transferred to [facility] [employer] upon submission to the contest via the [employer] form on November 22, 2020

## 2020-08-21 DIAGNOSIS — M6281 Muscle weakness (generalized): Secondary | ICD-10-CM | POA: Diagnosis not present

## 2020-08-21 DIAGNOSIS — R2689 Other abnormalities of gait and mobility: Secondary | ICD-10-CM | POA: Diagnosis not present

## 2020-08-21 DIAGNOSIS — G20 Parkinson's disease: Secondary | ICD-10-CM | POA: Diagnosis not present

## 2020-08-22 DIAGNOSIS — H04123 Dry eye syndrome of bilateral lacrimal glands: Secondary | ICD-10-CM | POA: Diagnosis not present

## 2020-08-22 DIAGNOSIS — H2513 Age-related nuclear cataract, bilateral: Secondary | ICD-10-CM | POA: Diagnosis not present

## 2020-08-22 DIAGNOSIS — G20 Parkinson's disease: Secondary | ICD-10-CM | POA: Diagnosis not present

## 2020-08-22 DIAGNOSIS — H5703 Miosis: Secondary | ICD-10-CM | POA: Diagnosis not present

## 2020-08-22 DIAGNOSIS — H2512 Age-related nuclear cataract, left eye: Secondary | ICD-10-CM | POA: Diagnosis not present

## 2020-08-22 DIAGNOSIS — H25042 Posterior subcapsular polar age-related cataract, left eye: Secondary | ICD-10-CM | POA: Diagnosis not present

## 2020-08-22 DIAGNOSIS — H25013 Cortical age-related cataract, bilateral: Secondary | ICD-10-CM | POA: Diagnosis not present

## 2020-08-24 DIAGNOSIS — G20 Parkinson's disease: Secondary | ICD-10-CM | POA: Diagnosis not present

## 2020-08-24 DIAGNOSIS — R2689 Other abnormalities of gait and mobility: Secondary | ICD-10-CM | POA: Diagnosis not present

## 2020-08-24 DIAGNOSIS — M6281 Muscle weakness (generalized): Secondary | ICD-10-CM | POA: Diagnosis not present

## 2020-08-28 DIAGNOSIS — G20 Parkinson's disease: Secondary | ICD-10-CM | POA: Diagnosis not present

## 2020-08-28 DIAGNOSIS — M6281 Muscle weakness (generalized): Secondary | ICD-10-CM | POA: Diagnosis not present

## 2020-08-28 DIAGNOSIS — R2689 Other abnormalities of gait and mobility: Secondary | ICD-10-CM | POA: Diagnosis not present

## 2020-08-31 DIAGNOSIS — R2689 Other abnormalities of gait and mobility: Secondary | ICD-10-CM | POA: Diagnosis not present

## 2020-08-31 DIAGNOSIS — M6281 Muscle weakness (generalized): Secondary | ICD-10-CM | POA: Diagnosis not present

## 2020-08-31 DIAGNOSIS — G20 Parkinson's disease: Secondary | ICD-10-CM | POA: Diagnosis not present

## 2020-09-01 ENCOUNTER — Other Ambulatory Visit: Payer: Self-pay | Admitting: Neurology

## 2020-09-04 DIAGNOSIS — H2512 Age-related nuclear cataract, left eye: Secondary | ICD-10-CM | POA: Diagnosis not present

## 2020-09-04 DIAGNOSIS — H268 Other specified cataract: Secondary | ICD-10-CM | POA: Diagnosis not present

## 2020-09-04 DIAGNOSIS — H5703 Miosis: Secondary | ICD-10-CM | POA: Diagnosis not present

## 2020-09-04 DIAGNOSIS — H25012 Cortical age-related cataract, left eye: Secondary | ICD-10-CM | POA: Diagnosis not present

## 2020-09-04 DIAGNOSIS — H278 Other specified disorders of lens: Secondary | ICD-10-CM | POA: Diagnosis not present

## 2020-09-04 DIAGNOSIS — H25013 Cortical age-related cataract, bilateral: Secondary | ICD-10-CM | POA: Diagnosis not present

## 2020-09-07 ENCOUNTER — Other Ambulatory Visit: Payer: Self-pay | Admitting: Internal Medicine

## 2020-09-12 DIAGNOSIS — H2511 Age-related nuclear cataract, right eye: Secondary | ICD-10-CM | POA: Diagnosis not present

## 2020-09-12 DIAGNOSIS — H25011 Cortical age-related cataract, right eye: Secondary | ICD-10-CM | POA: Diagnosis not present

## 2020-09-26 LAB — HM DIABETES EYE EXAM

## 2020-09-27 DIAGNOSIS — M79674 Pain in right toe(s): Secondary | ICD-10-CM | POA: Diagnosis not present

## 2020-09-27 DIAGNOSIS — B351 Tinea unguium: Secondary | ICD-10-CM | POA: Diagnosis not present

## 2020-09-27 DIAGNOSIS — M79675 Pain in left toe(s): Secondary | ICD-10-CM | POA: Diagnosis not present

## 2020-10-09 DIAGNOSIS — N3946 Mixed incontinence: Secondary | ICD-10-CM | POA: Diagnosis not present

## 2020-10-09 DIAGNOSIS — R3915 Urgency of urination: Secondary | ICD-10-CM | POA: Diagnosis not present

## 2020-10-09 DIAGNOSIS — N401 Enlarged prostate with lower urinary tract symptoms: Secondary | ICD-10-CM | POA: Diagnosis not present

## 2020-10-10 ENCOUNTER — Other Ambulatory Visit: Payer: Self-pay | Admitting: Internal Medicine

## 2020-10-13 ENCOUNTER — Other Ambulatory Visit: Payer: Self-pay | Admitting: Internal Medicine

## 2020-10-13 ENCOUNTER — Ambulatory Visit (INDEPENDENT_AMBULATORY_CARE_PROVIDER_SITE_OTHER): Payer: Medicare Other

## 2020-10-13 DIAGNOSIS — I495 Sick sinus syndrome: Secondary | ICD-10-CM

## 2020-10-16 LAB — CUP PACEART REMOTE DEVICE CHECK
Battery Remaining Longevity: 78 mo
Battery Remaining Percentage: 77 %
Brady Statistic RA Percent Paced: 34 %
Brady Statistic RV Percent Paced: 2 %
Date Time Interrogation Session: 20220723042400
Implantable Lead Implant Date: 20151013
Implantable Lead Implant Date: 20151013
Implantable Lead Location: 753859
Implantable Lead Location: 753860
Implantable Lead Model: 4135
Implantable Lead Model: 4136
Implantable Lead Serial Number: 29480373
Implantable Lead Serial Number: 29608302
Implantable Pulse Generator Implant Date: 20151013
Lead Channel Impedance Value: 490 Ohm
Lead Channel Impedance Value: 548 Ohm
Lead Channel Setting Pacing Amplitude: 2 V
Lead Channel Setting Pacing Amplitude: 4 V
Lead Channel Setting Pacing Pulse Width: 1 ms
Lead Channel Setting Sensing Sensitivity: 4 mV
Pulse Gen Serial Number: 702951

## 2020-10-17 ENCOUNTER — Encounter: Payer: Self-pay | Admitting: Internal Medicine

## 2020-10-17 ENCOUNTER — Ambulatory Visit (INDEPENDENT_AMBULATORY_CARE_PROVIDER_SITE_OTHER): Payer: Medicare Other | Admitting: Internal Medicine

## 2020-10-17 ENCOUNTER — Other Ambulatory Visit: Payer: Self-pay

## 2020-10-17 VITALS — BP 116/74 | HR 77 | Temp 97.5°F | Resp 18 | Ht 69.0 in | Wt 155.2 lb

## 2020-10-17 DIAGNOSIS — F419 Anxiety disorder, unspecified: Secondary | ICD-10-CM

## 2020-10-17 DIAGNOSIS — Z79899 Other long term (current) drug therapy: Secondary | ICD-10-CM

## 2020-10-17 DIAGNOSIS — E785 Hyperlipidemia, unspecified: Secondary | ICD-10-CM

## 2020-10-17 DIAGNOSIS — E119 Type 2 diabetes mellitus without complications: Secondary | ICD-10-CM

## 2020-10-17 DIAGNOSIS — I480 Paroxysmal atrial fibrillation: Secondary | ICD-10-CM | POA: Diagnosis not present

## 2020-10-17 DIAGNOSIS — E039 Hypothyroidism, unspecified: Secondary | ICD-10-CM

## 2020-10-17 DIAGNOSIS — F32A Depression, unspecified: Secondary | ICD-10-CM | POA: Diagnosis not present

## 2020-10-17 NOTE — Progress Notes (Signed)
Subjective:    Patient ID: Stephen Robbins, male    DOB: 12-30-1935, 85 y.o.   MRN: [id number]  DOS:  10/17/2020 Type of visit - description: ROV  Here for follow-up Today with talk about depression, recent falls, diabetes, high cholesterol. Recently had left cataract surgery, not recovering as quickly as he would like to. Last week had 2 incidents when he fall asleep inappropriately (while listening a talk and while eating). No episodes this week.  Review of Systems Denies nausea, vomiting.  No diarrhea or blood in the stools. No gross blood in the urine.  Past Medical History:  Diagnosis Date   Arthritis    "joints; a little" (01/04/2014)   Asymptomatic bilateral carotid artery stenosis    per duplex  05-15-2012  left >39%/   right 40-59%   Basal cell carcinoma    nose   Borderline diabetes    BPH (benign prostatic hypertrophy) with urinary obstruction    Bradycardia    Bronchial pneumonia 1958   Chronotropic incompetence with sinus node dysfunction (HCC)    Compressed cervical disc    Compression of lumbar vertebra (HCC)    L4 -- L5   Dizzy    Dysmetabolic syndrome    Fatigue    Frequency of urination    GERD (gastroesophageal reflux disease)    occasional   Hemorrhoids    Hyperlipidemia    Hypothyroidism    Nocturia    NSVT (nonsustained ventricular tachycardia) [facility])    cardiologist-  dr croitoru   Pacemaker    Urgency of urination    Wears glasses     Past Surgical History:  Procedure Laterality Date   BASAL CELL CARCINOMA EXCISION     nose   CLOSED REDUCTION NASAL FRACTURE  09-01-2007   CYSTOSCOPY N/A 12/28/2012   Procedure: CYSTOSCOPY;  Surgeon: Bernestine Amass, MD;  Location: [facility];  Service: Urology;  Laterality: N/A;   EXERCISE TOLERENCE TEST  12-03-2012  DR CROITURO   CHRONOTROPIC INCOMPETENCE/ NORMAL RESTING BP W/ APPROPRIATE RESPONSE/ NO CHEST PAIN/ NO ST CHANGES FROM BASELINE   INSERT / REPLACE / REMOVE PACEMAKER  01/04/2014    [employer] model L121 serial number [id number]   LACERATION REPAIR Right 1978   middle finger   NEUROPLASTY / TRANSPOSITION ULNAR NERVE AT ELBOW Left 02-09-2010   PERMANENT PACEMAKER INSERTION N/A 01/04/2014   Procedure: PERMANENT PACEMAKER INSERTION;  Surgeon: Sanda Klein, MD;  Location: [place] [facility];  Service: Cardiovascular;  Laterality: N/A;   SKIN BIOPSY     scc   TONSILLECTOMY AND ADENOIDECTOMY  1944   TRANSURETHRAL INCISION OF PROSTATE N/A 12/28/2012   Procedure: TRANSURETHRAL INCISION OF THE PROSTATE (TUIP);  Surgeon: Bernestine Amass, MD;  Location: [facility];  Service: Urology;  Laterality: N/A;   ULNAR NERVE TRANSPOSITION      Allergies as of 10/17/2020       Reactions   Penicillins Other (See Comments)   Unknown childhood reaction        Medication List        Accurate as of October 17, 2020  2:26 PM. If you have any questions, ask your nurse or doctor.          Accu-Chek FastClix Lancets Misc Check blood sugar three times daily   Accu-Chek Guide Me w/Device Kit 1 Device by Does not apply route as directed.   bisacodyl 5 MG EC tablet Commonly known as: DULCOLAX Take 5 mg daily as  needed by mouth for moderate constipation.   Blood Pressure Kit Take blood pressure twice weekly.   buPROPion 75 MG tablet Commonly known as: WELLBUTRIN Take 1 tablet (75 mg total) by mouth 2 (two) times daily.   carbidopa-levodopa 25-100 MG tablet Commonly known as: SINEMET IR TAKE 2 TABLETS BY MOUTH THREE TIMES DAILY   dapagliflozin propanediol 10 MG Tabs tablet Commonly known as: Farxiga Take 1 tablet (10 mg total) by mouth daily.   FreeStyle Libre 14 Day Reader Kerrin Mo Check blood sugars daily as directed   FreeStyle Libre 14 Day Sensor Misc Check blood sugars once daily as directed   gabapentin 300 MG capsule Commonly known as: NEURONTIN TAKE 1 CAPSULE(300 MG) BY MOUTH TWICE DAILY   glucose blood test strip Commonly known as:  Accu-Chek Guide Check blood sugar three times daily   levothyroxine 75 MCG tablet Commonly known as: SYNTHROID Take 1 tablet (75 mcg total) by mouth daily before breakfast.   metFORMIN 1000 MG tablet Commonly known as: GLUCOPHAGE TAKE 1 TABLET(1000 MG) BY MOUTH TWICE DAILY WITH A MEAL   mirabegron ER 25 MG Tb24 tablet Commonly known as: MYRBETRIQ Take 25 mg by mouth daily.   Polyethyl Glycol-Propyl Glycol 0.4-0.3 % Soln Place 1 drop into both eyes daily as needed (dry eyes).   polyethylene glycol powder 17 GM/SCOOP powder Commonly known as: MiraLax Take 17 g daily by mouth.   sertraline 100 MG tablet Commonly known as: ZOLOFT Take 1 tablet (100 mg total) by mouth daily.   simvastatin 20 MG tablet Commonly known as: ZOCOR Take 1 tablet (20 mg total) by mouth at bedtime.   tamsulosin 0.4 MG Caps capsule Commonly known as: FLOMAX Take 0.4 mg by mouth daily.   vitamin B-12 1000 MCG tablet Commonly known as: CYANOCOBALAMIN Take 1,000 mcg by mouth every morning.   VITAMIN D (ERGOCALCIFEROL) PO Take by mouth.   Xarelto 20 MG Tabs tablet Generic drug: rivaroxaban TAKE 1 TABLET(20 MG) BY MOUTH DAILY           Objective:   Physical Exam BP 116/74 (BP Location: Left Arm, Patient Position: Sitting, Cuff Size: Small)   Pulse 77   Temp (!) 97.5 F (36.4 C) (Oral)   Resp 18   Ht $R'5\' 9"'Vy$  (1.753 m)   Wt 155 lb 4 oz (70.4 kg)   SpO2 97%   BMI 22.93 kg/m  General:   Well developed, NAD, BMI noted. HEENT:  Normocephalic . Face symmetric, atraumatic Lungs:  CTA B Normal respiratory effort, no intercostal retractions, no accessory muscle use. Heart: Seems regular,  no murmur.  Lower extremities: no pretibial edema bilaterally  Skin: Not pale. Not jaundice Neurologic:  alert & oriented X3.  Speech normal, gait assisted by walker.   Psych--  Cognition and judgment appear intact.  Cooperative with normal attention span and concentration.  Behavior appropriate. No  anxious or depressed appearing.      Assessment       ASSESSMENT (New patient 11/18/17, transferring from Dr. Raliegh Ip) Diabetes DM Neuropathy Hypothyroidism Depression Hyperlipidemia CV: Paroxysmal A. fib, sick sinus syndrome,  pacemaker, NSVT, orthostatic hypotension, on Xarelto Hypogonadism NEURO: Dr Carles Collet --Parkinson disease --Peripheral neuropathy Constipation (h/o impaction), alternates w/ diarrhea  Urology , urine incontinence   PLAN DM: At the last visit, Januvia was stopped due to polypharmacy, currently on metformin, Farxiga, no ambulatory CBGs.  Check CMP, A1c. Neuropathy: Reports he is taking gabapentin Hypothyroidism: On Synthroid, check a TSH. Depression: Ongoing issue, taking sertraline regularly but  bupropion only "as needed". List of counselors provided, strongly encouraged to reach out to them.  Continue sertraline, recommend bupropion B.I.D. every day. A. fib: Anticoagulated, check a CBC.  Last visit with cardiology 01-2020 Parkinson: Saw neurology 08/18/2020.  Note reviewed, no changes made Falling asleep: As described above, had 2 incidents last week, no this week.  For now rec observation, he is not driving currently.  To call if that happens again. Polypharmacy: Referral to our clinical pharmacist. Frequent falls: Fortunately no major incidents since the last visit, uses walker appropriately. Vaccines: Had a reaction to the second COVID-vaccine. L cataract surgery: Slowly recovering. RTC 4 months      This visit occurred during the SARS-CoV-2 public health emergency.  Safety protocols were in place, including screening questions prior to the visit, additional usage of staff PPE, and extensive cleaning of exam room while observing appropriate contact time as indicated for disinfecting solutions.

## 2020-10-17 NOTE — Patient Instructions (Signed)
For depression: Strongly recommend you to reach out to a counselor Also take bupropion twice daily every day. Continue sertraline 100 mg 1 tablet daily  If you continue to feel sleepy, please let me know  Will arrange a referral for you to see our clinical pharmacist    [place] [facility] : Get the blood work     [place] back for   a checkup in 4 months

## 2020-10-18 LAB — CBC WITH DIFFERENTIAL/PLATELET
Basophils Absolute: 0 10*3/uL (ref 0.0–0.1)
Basophils Relative: 0.7 % (ref 0.0–3.0)
Eosinophils Absolute: 0.1 10*3/uL (ref 0.0–0.7)
Eosinophils Relative: 1.1 % (ref 0.0–5.0)
HCT: 41.6 % (ref 39.0–52.0)
Hemoglobin: 14.5 g/dL (ref 13.0–17.0)
Lymphocytes Relative: 23.1 % (ref 12.0–46.0)
Lymphs Abs: 1.5 10*3/uL (ref 0.7–4.0)
MCHC: 34.7 g/dL (ref 30.0–36.0)
MCV: 100.1 fl — ABNORMAL HIGH (ref 78.0–100.0)
Monocytes Absolute: 0.7 10*3/uL (ref 0.1–1.0)
Monocytes Relative: 9.9 % (ref 3.0–12.0)
Neutro Abs: 4.3 10*3/uL (ref 1.4–7.7)
Neutrophils Relative %: 65.2 % (ref 43.0–77.0)
Platelets: 197 10*3/uL (ref 150.0–400.0)
RBC: 4.16 Mil/uL — ABNORMAL LOW (ref 4.22–5.81)
RDW: 13.9 % (ref 11.5–15.5)
WBC: 6.6 10*3/uL (ref 4.0–10.5)

## 2020-10-18 LAB — LIPID PANEL
Cholesterol: 202 mg/dL — ABNORMAL HIGH (ref 0–200)
HDL: 39.5 mg/dL (ref >39.00)
LDL Cholesterol: 134 mg/dL — ABNORMAL HIGH (ref 0–99)
NonHDL: 162.71
Total CHOL/HDL Ratio: 5
Triglycerides: 146 mg/dL (ref 0.0–149.0)
VLDL: 29.2 mg/dL (ref 0.0–40.0)

## 2020-10-18 LAB — COMPREHENSIVE METABOLIC PANEL
ALT: 10 U/L (ref 0–53)
AST: 18 U/L (ref 0–37)
Albumin: 4.3 g/dL (ref 3.5–5.2)
Alkaline Phosphatase: 48 U/L (ref 39–117)
BUN: 22 mg/dL (ref 6–23)
CO2: 30 mEq/L (ref 19–32)
Calcium: 9.9 mg/dL (ref 8.4–10.5)
Chloride: 102 mEq/L (ref 96–112)
Creatinine, Ser: 0.96 mg/dL (ref 0.40–1.50)
GFR: 73.23 mL/min (ref >60.00)
Glucose, Bld: 145 mg/dL — ABNORMAL HIGH (ref 70–99)
Potassium: 4.5 mEq/L (ref 3.5–5.1)
Sodium: 138 mEq/L (ref 135–145)
Total Bilirubin: 0.6 mg/dL (ref 0.2–1.2)
Total Protein: 7.1 g/dL (ref 6.0–8.3)

## 2020-10-18 LAB — HEMOGLOBIN A1C: Hgb A1c MFr Bld: 6.8 % — ABNORMAL HIGH (ref 4.6–6.5)

## 2020-10-18 LAB — TSH: TSH: 3.31 u[IU]/mL (ref 0.35–5.50)

## 2020-10-18 NOTE — Assessment & Plan Note (Signed)
DM: At the last visit, Januvia was stopped due to polypharmacy, currently on metformin, Farxiga, no ambulatory CBGs.  Check CMP, A1c. Neuropathy: Reports he is taking gabapentin Hypothyroidism: On Synthroid, check a TSH. Depression: Ongoing issue, taking sertraline regularly but bupropion only "as needed". List of counselors provided, strongly encouraged to reach out to them.  Continue sertraline, recommend bupropion B.I.D. every day. A. fib: Anticoagulated, check a CBC.  Last visit with cardiology 01-2020 Parkinson: Saw neurology 08/18/2020.  Note reviewed, no changes made Falling asleep: As described above, had 2 incidents last week, no this week.  For now rec observation, he is not driving currently.  To call if that happens again. Polypharmacy: Referral to our clinical pharmacist. Frequent falls: Fortunately no major incidents since the last visit, uses walker appropriately. Vaccines: Had a reaction to the second COVID-vaccine. L cataract surgery: Slowly recovering. RTC 4 months

## 2020-10-23 MED ORDER — SIMVASTATIN 40 MG PO TABS: 40.0000 mg | ORAL_TABLET | Freq: Every day | ORAL | 1 refills | Status: DC

## 2020-10-23 NOTE — Addendum Note (Signed)
Addended byDamita Dunnings D on: 10/23/2020 07:39 AM   Modules accepted: Orders

## 2020-11-06 NOTE — Progress Notes (Signed)
Remote pacemaker transmission.   

## 2020-11-09 ENCOUNTER — Other Ambulatory Visit: Payer: Self-pay | Admitting: Internal Medicine

## 2020-11-13 ENCOUNTER — Telehealth: Payer: Self-pay

## 2020-11-13 NOTE — Telephone Encounter (Signed)
Okay, thank you!

## 2020-11-13 NOTE — Telephone Encounter (Signed)
Nurse Assessment Nurse: Hassell Done, RN, Joelene Millin Date/Time Eilene Ghazi Time): 11/10/2020 5:14:05 PM Confirm and document reason for call. If symptomatic, describe symptoms. ---caller states he has parkinsons and his legs are starting to give him trouble. he states he is unable to walk easily and not able to cross his legs. no fever. Does the patient have any new or worsening symptoms? ---Yes Will a triage be completed? ---Yes Related visit to physician within the last 2 weeks? ---No Does the PT have any chronic conditions? (i.e. diabetes, asthma, this includes High risk factors for pregnancy, etc.) ---Yes List chronic conditions. ---parkinsons, diabetes, incontinence Is this a behavioral health or substance abuse call? ---No Guidelines Guideline Title Affirmed Question Affirmed Notes Nurse Date/Time (Eastern Time) Weakness (Generalized) and Fatigue [1] MODERATE weakness (i.e., interferes with work, school, normal activities) AND [2] cause unknown (Exceptions: weakness with acute minor illness, or weakness from poor fluid intake) Hassell Done, RN, Joelene Millin 11/10/2020 5:16:35 PM PLEASE NOTE: All timestamps contained within this report are represented as [place] Standard Time. CONFIDENTIALTY NOTICE: This fax transmission is intended only for the addressee. It contains information that is legally privileged, confidential or otherwise protected from use or disclosure. If you are not the intended recipient, you are strictly prohibited from reviewing, disclosing, copying using or disseminating any of this information or taking any action in reliance on or regarding this information. If you have received this fax in error, please notify us immediately by telephone so that we can arrange for its return to [place]. Phone: [phone], Toll-Free: [phone], Fax: [phone] Page: 2 of 2 Call Id: [id number] [place]. Time Eilene Ghazi Time) Disposition Final User 11/10/2020 5:26:27 PM See HCP within 4 Hours  (or PCP triage) Yes Hassell Done, RN, Renea Ee Disagree/Comply Comply Caller Understands Yes PreDisposition Call Doctor Care Advice Given Per Guideline SEE HCP (OR PCP TRIAGE) WITHIN 4 HOURS: * IF OFFICE WILL BE CLOSED AND NO PCP (PRIMARY CARE PROVIDER) SECOND-LEVEL TRIAGE: You need to be seen within the next 3 or 4 hours. A nearby Urgent Care Center [facility]) is often a good source of care. Another choice is to go to the ED. Go sooner if you become worse. BRING MEDICINES: * Please bring a list of your current medicines when you go to see the doctor. * It is also a good idea to bring the pill bottles too. This will help the doctor to make certain you are taking the right medicines and the right dose. CALL BACK IF: * You become worse CARE ADVICE given per Weakness and Fatigue (Adult) guideline. Referrals GO TO FACILITY UNDECIDED

## 2020-11-13 NOTE — Telephone Encounter (Signed)
Pt scheduled for appt on 11/15/20 at 1:20pm.

## 2020-11-13 NOTE — Telephone Encounter (Signed)
FYI, after hours call. I do not see where he has reached out to his neurologist.

## 2020-11-15 ENCOUNTER — Encounter: Payer: Self-pay | Admitting: Internal Medicine

## 2020-11-15 ENCOUNTER — Ambulatory Visit (INDEPENDENT_AMBULATORY_CARE_PROVIDER_SITE_OTHER): Payer: Medicare Other | Admitting: Internal Medicine

## 2020-11-15 ENCOUNTER — Other Ambulatory Visit: Payer: Self-pay

## 2020-11-15 VITALS — BP 98/60 | HR 71 | Temp 97.7°F | Resp 18 | Ht 69.0 in | Wt 151.1 lb

## 2020-11-15 DIAGNOSIS — Z79899 Other long term (current) drug therapy: Secondary | ICD-10-CM

## 2020-11-15 DIAGNOSIS — G20 Parkinson's disease: Secondary | ICD-10-CM

## 2020-11-15 DIAGNOSIS — I480 Paroxysmal atrial fibrillation: Secondary | ICD-10-CM

## 2020-11-15 DIAGNOSIS — E119 Type 2 diabetes mellitus without complications: Secondary | ICD-10-CM

## 2020-11-15 NOTE — Progress Notes (Signed)
Subjective:    Patient ID: Stephen Robbins, male    DOB: [birth year], 85 y.o.   MRN: [id number]  DOS:  11/15/2020 Type of visit - description: Acute  The patient is here today regards Parkinson. He feels his overall condition has deteriorated lately. Mobility is more impaired, affecting activities of daily living such as putting his socks on. He has several "close calls" but no actual fall. Has noted that he has a tendency to tilt to the left. This is going on for about a year, does not recall any recent episodes of slurred speech, face numbness or face paresis. He is also very sleepy. Feels quite fatigued   Review of Systems See above   Past Medical History:  Diagnosis Date   Arthritis    "joints; a little" (01/04/2014)   Asymptomatic bilateral carotid artery stenosis    per duplex  05-15-2012  left >39%/   right 40-59%   Basal cell carcinoma    nose   Borderline diabetes    BPH (benign prostatic hypertrophy) with urinary obstruction    Bradycardia    Bronchial pneumonia 1958   Chronotropic incompetence with sinus node dysfunction (HCC)    Compressed cervical disc    Compression of lumbar vertebra (HCC)    L4 -- L5   Dizzy    Dysmetabolic syndrome    Fatigue    Frequency of urination    GERD (gastroesophageal reflux disease)    occasional   Hemorrhoids    Hyperlipidemia    Hypothyroidism    Nocturia    NSVT (nonsustained ventricular tachycardia) [facility])    cardiologist-  dr croitoru   Pacemaker    Urgency of urination    Wears glasses     Past Surgical History:  Procedure Laterality Date   BASAL CELL CARCINOMA EXCISION     nose   CLOSED REDUCTION NASAL FRACTURE  09-01-2007   CYSTOSCOPY N/A 12/28/2012   Procedure: CYSTOSCOPY;  Surgeon: Bernestine Amass, MD;  Location: [facility];  Service: Urology;  Laterality: N/A;   EXERCISE TOLERENCE TEST  12-03-2012  DR CROITURO   CHRONOTROPIC INCOMPETENCE/ NORMAL RESTING BP W/ APPROPRIATE RESPONSE/ NO CHEST  PAIN/ NO ST CHANGES FROM BASELINE   INSERT / REPLACE / REMOVE PACEMAKER  01/04/2014   [employer] model L121 serial number [id number]   LACERATION REPAIR Right 1978   middle finger   NEUROPLASTY / TRANSPOSITION ULNAR NERVE AT ELBOW Left 02-09-2010   PERMANENT PACEMAKER INSERTION N/A 01/04/2014   Procedure: PERMANENT PACEMAKER INSERTION;  Surgeon: Sanda Klein, MD;  Location: [place] [facility];  Service: Cardiovascular;  Laterality: N/A;   SKIN BIOPSY     scc   TONSILLECTOMY AND ADENOIDECTOMY  1944   TRANSURETHRAL INCISION OF PROSTATE N/A 12/28/2012   Procedure: TRANSURETHRAL INCISION OF THE PROSTATE (TUIP);  Surgeon: Bernestine Amass, MD;  Location: [facility];  Service: Urology;  Laterality: N/A;   ULNAR NERVE TRANSPOSITION      Allergies as of 11/15/2020       Reactions   Covid-19 Mrna Vacc (moderna)    "Couldn't move". No rash, no itching    Penicillins Other (See Comments)   Unknown childhood reaction        Medication List        Accurate as of November 15, 2020 11:59 PM. If you have any questions, ask your nurse or doctor.          STOP taking these medications  mirabegron ER 25 MG Tb24 tablet Commonly known as: MYRBETRIQ Stopped by: Kathlene November, MD       TAKE these medications    Accu-Chek FastClix Lancets Misc Check blood sugar three times daily   Accu-Chek Guide Me w/Device Kit 1 Device by Does not apply route as directed.   bisacodyl 5 MG EC tablet Commonly known as: DULCOLAX Take 5 mg daily as needed by mouth for moderate constipation.   Blood Pressure Kit Take blood pressure twice weekly.   buPROPion 75 MG tablet Commonly known as: WELLBUTRIN Take 1 tablet (75 mg total) by mouth 2 (two) times daily.   carbidopa-levodopa 25-100 MG tablet Commonly known as: SINEMET IR TAKE 2 TABLETS BY MOUTH THREE TIMES DAILY   dapagliflozin propanediol 10 MG Tabs tablet Commonly known as: Farxiga Take 1 tablet (10 mg total) by mouth  daily.   FreeStyle Libre 14 Day Reader Kerrin Mo Check blood sugars daily as directed   FreeStyle Libre 14 Day Sensor Misc Check blood sugars once daily as directed   gabapentin 300 MG capsule Commonly known as: NEURONTIN TAKE 1 CAPSULE(300 MG) BY MOUTH TWICE DAILY   glucose blood test strip Commonly known as: Accu-Chek Guide Check blood sugar three times daily   levothyroxine 75 MCG tablet Commonly known as: SYNTHROID Take 1 tablet (75 mcg total) by mouth daily before breakfast.   metFORMIN 1000 MG tablet Commonly known as: GLUCOPHAGE TAKE 1 TABLET(1000 MG) BY MOUTH TWICE DAILY WITH A MEAL   polyethylene glycol powder 17 GM/SCOOP powder Commonly known as: MiraLax Take 17 g daily by mouth.   sertraline 100 MG tablet Commonly known as: ZOLOFT Take 1 tablet (100 mg total) by mouth daily.   simvastatin 40 MG tablet Commonly known as: ZOCOR Take 1 tablet (40 mg total) by mouth at bedtime.   tamsulosin 0.4 MG Caps capsule Commonly known as: FLOMAX Take 0.4 mg by mouth daily.   vitamin B-12 1000 MCG tablet Commonly known as: CYANOCOBALAMIN Take 1,000 mcg by mouth every morning.   VITAMIN D (ERGOCALCIFEROL) PO Take by mouth.   Xarelto 20 MG Tabs tablet Generic drug: rivaroxaban TAKE 1 TABLET(20 MG) BY MOUTH DAILY           Objective:   Physical Exam BP 98/60 (BP Location: Right Arm, Patient Position: Sitting, Cuff Size: Small)   Pulse 71   Temp 97.7 F (36.5 C) (Oral)   Resp 18   Ht 5' 9"  (1.753 m)   Wt 151 lb 2 oz (68.5 kg)   SpO2 98%   BMI 22.32 kg/m  General:   Well developed, NAD, BMI noted. HEENT:  Normocephalic . Face symmetric, atraumatic Lungs:  CTA B Normal respiratory effort, no intercostal retractions, no accessory muscle use. Heart: RRR,  no murmur.  Lower extremities: no pretibial edema bilaterally  Skin: Eczematous changes at the face  neurologic:  alert & oriented X3.  Speech is slow, face masked, motor seems symmetric. Walk:  Assisted by a walker, very slow Posture: He is indeed leaning to the left but able to correct his posture by himself. Psych--  Cognition and judgment appear intact.  Cooperative with normal attention span and concentration.  Behavior appropriate. No anxious or depressed appearing.      Assessment       ASSESSMENT (New patient 11/18/17, transferring from Dr. Raliegh Ip) Diabetes DM Neuropathy Hypothyroidism Depression Hyperlipidemia CV: Paroxysmal A. fib, sick sinus syndrome,  pacemaker, NSVT, orthostatic hypotension, on Xarelto Hypogonadism NEURO: Dr [name] --Parkinson disease --Peripheral neuropathy  Constipation (h/o impaction), alternates w/ diarrhea  Urology , urine incontinence   PLAN Parkinson's: Presents today with decreased mobility, somnolence, fatigue. Labs from the last visit were satisfactory and I found nothing to account for his symptoms thus I think the sxs are  Parkinson's related. He asked me about Parkinson prognosis, I made clear this is not my specialty but PD is a slowly progressive disease, recommend to see neurology, will arrange a early f/u w/ them for further explanation of prognosis and management options. Mildly concerns of fall, he had physical therapy I will refer him again. I suggested to transfer to assisted living facility at [facility] burn as he is getting less independent. Palliative care could be an option. Patient is concerned about "tilting to the left", moderate symmetric on exam, no recent stroke symptoms.  Recommend observation.  Time spent 28 minutes  This visit occurred during the SARS-CoV-2 public health emergency.  Safety protocols were in place, including screening questions prior to the visit, additional usage of staff PPE, and extensive cleaning of exam room while observing appropriate contact time as indicated for disinfecting solutions.

## 2020-11-15 NOTE — Patient Instructions (Addendum)
We will ask neurology to see you  We will refer you to physical therapy  Consider transfer to the assisted living facility area at [facility]

## 2020-11-16 ENCOUNTER — Telehealth: Payer: Self-pay

## 2020-11-16 NOTE — Telephone Encounter (Signed)
Pt called- he had several questions regarding his visit yesterday- he was able to review OV note via Mychart and didn't see any mention of possible stroke or TIA as discussed yesterday- w/ PCP and wants to know if this can be added. He also wanted to know if he should have a work-up to see if he has truly had a stroke or not. Informed this would likely be done via MRI/MRA but he has a pacemaker and informed I don't believe we would be able to do that. He wanted to know if any other means if testing can be done. Also, he is scheduled to have eye surgery with Dr. Annia Belt on Monday 11/20/20 and wanted to know if he should cancel it, (they are not putting him to sleep only numbing his eye), he is seeing her later this afternoon at 3:30pm and I recommended he discuss with her. I informed him I'd find out more information on above and call him back; informed I was leaving office early today at 1pm I may not call back until tomorrow. Pt verbalized understanding.

## 2020-11-16 NOTE — Telephone Encounter (Signed)
My note was not complete, I just finished it.   I did advise him to discuss with neurology further questions.  I do not think he needs a stroke work-up urgently

## 2020-11-16 NOTE — Assessment & Plan Note (Signed)
PLAN Parkinson's: Presents today with decreased mobility, somnolence, fatigue. Labs from the last visit were satisfactory and I found nothing to account for his symptoms thus I think the sxs are  Parkinson's related. He asked me about Parkinson prognosis, I made clear this is not my specialty but PD is a slowly progressive disease, recommend to see neurology, will arrange a early f/u w/ them for further explanation of prognosis and management options. Mildly concerns of fall, he had physical therapy I will refer him again. I suggested to transfer to assisted living facility at [facility] burn as he is getting less independent. Palliative care could be an option. Patient is concerned about "tilting to the left", moderate symmetric on exam, no recent stroke symptoms.  Recommend observation.

## 2020-11-17 ENCOUNTER — Telehealth: Payer: Self-pay | Admitting: Neurology

## 2020-11-17 NOTE — Telephone Encounter (Signed)
Spoke w/ [name] informed that ov note is complete now w/ mention of possible stroke. He did hear from Dr. Doristine Devoid office about a sooner appt- Dr. Carles Collet is supposed to be in communication w/ Dr. [name] soon. I informed him I'd make Dr. [name] aware to be on look out for communication from her. He was able to visit the assisted living at [facility] yesterday and is still unsure if he wants to move there as there was no one out in the social rooms/tv rooms, he didn't this but will still keep it in mind. He thanked me for calling him back.

## 2020-11-17 NOTE — Telephone Encounter (Signed)
Dr. Carles Collet, our mutual patient feels like he is "tilting to the left" whenever he sits down. He thought he could have a recent stroke. That was not my impression, I recommended observation.  He did have a lot of concerns about Parkinson prognosis, I answer him the best I could but recommended to see you for a more detailed discussion.  Thank you

## 2020-11-17 NOTE — Telephone Encounter (Signed)
On phone, patient stated that PCP thought he had stroke 2 weeks ago.  Reviewed PCP notes.  Don't see that noted at all.  See notes about Parkinsons Disease.  Will ask PCP.

## 2020-11-17 NOTE — Telephone Encounter (Signed)
Based on his symptoms, I did not think he had a stroke and recommended observation. He remained quite concerned about a possible stroke, I told him that he does not need a urgent work-up at this point

## 2020-11-20 ENCOUNTER — Emergency Department (HOSPITAL_COMMUNITY)
Admission: EM | Admit: 2020-11-20 | Discharge: 2020-11-21 | Disposition: A | Discharge status: Home / Self Care | Payer: Medicare Other | Attending: Emergency Medicine | Admitting: Emergency Medicine

## 2020-11-20 ENCOUNTER — Telehealth: Payer: Self-pay

## 2020-11-20 ENCOUNTER — Other Ambulatory Visit: Payer: Self-pay

## 2020-11-20 ENCOUNTER — Encounter (HOSPITAL_COMMUNITY): Payer: Self-pay

## 2020-11-20 ENCOUNTER — Emergency Department (HOSPITAL_COMMUNITY): Payer: Medicare Other

## 2020-11-20 DIAGNOSIS — Z85828 Personal history of other malignant neoplasm of skin: Secondary | ICD-10-CM | POA: Insufficient documentation

## 2020-11-20 DIAGNOSIS — R410 Disorientation, unspecified: Secondary | ICD-10-CM | POA: Diagnosis not present

## 2020-11-20 DIAGNOSIS — G20 Parkinson's disease: Secondary | ICD-10-CM | POA: Diagnosis not present

## 2020-11-20 DIAGNOSIS — Z79899 Other long term (current) drug therapy: Secondary | ICD-10-CM | POA: Insufficient documentation

## 2020-11-20 DIAGNOSIS — E1140 Type 2 diabetes mellitus with diabetic neuropathy, unspecified: Secondary | ICD-10-CM | POA: Insufficient documentation

## 2020-11-20 DIAGNOSIS — Z950 Presence of cardiac pacemaker: Secondary | ICD-10-CM | POA: Insufficient documentation

## 2020-11-20 DIAGNOSIS — I4891 Unspecified atrial fibrillation: Secondary | ICD-10-CM | POA: Diagnosis not present

## 2020-11-20 DIAGNOSIS — M545 Low back pain, unspecified: Secondary | ICD-10-CM | POA: Diagnosis not present

## 2020-11-20 DIAGNOSIS — R531 Weakness: Secondary | ICD-10-CM | POA: Insufficient documentation

## 2020-11-20 DIAGNOSIS — Z7984 Long term (current) use of oral hypoglycemic drugs: Secondary | ICD-10-CM | POA: Diagnosis not present

## 2020-11-20 DIAGNOSIS — Z87891 Personal history of nicotine dependence: Secondary | ICD-10-CM | POA: Insufficient documentation

## 2020-11-20 DIAGNOSIS — G319 Degenerative disease of nervous system, unspecified: Secondary | ICD-10-CM | POA: Diagnosis not present

## 2020-11-20 DIAGNOSIS — I6782 Cerebral ischemia: Secondary | ICD-10-CM | POA: Diagnosis not present

## 2020-11-20 DIAGNOSIS — Z7901 Long term (current) use of anticoagulants: Secondary | ICD-10-CM | POA: Insufficient documentation

## 2020-11-20 DIAGNOSIS — W19XXXA Unspecified fall, initial encounter: Secondary | ICD-10-CM | POA: Diagnosis not present

## 2020-11-20 DIAGNOSIS — E039 Hypothyroidism, unspecified: Secondary | ICD-10-CM | POA: Insufficient documentation

## 2020-11-20 DIAGNOSIS — R202 Paresthesia of skin: Secondary | ICD-10-CM | POA: Diagnosis not present

## 2020-11-20 LAB — BASIC METABOLIC PANEL
Anion gap: 8 (ref 5–15)
BUN: 16 mg/dL (ref 8–23)
CO2: 26 mmol/L (ref 22–32)
Calcium: 9.4 mg/dL (ref 8.9–10.3)
Chloride: 103 mmol/L (ref 98–111)
Creatinine, Ser: 0.73 mg/dL (ref 0.61–1.24)
GFR, Estimated: >60 mL/min (ref >60)
Glucose, Bld: 130 mg/dL — ABNORMAL HIGH (ref 70–99)
Potassium: 3.6 mmol/L (ref 3.5–5.1)
Sodium: 137 mmol/L (ref 135–145)

## 2020-11-20 LAB — CBC WITH DIFFERENTIAL/PLATELET
Abs Immature Granulocytes: 0.02 10*3/uL (ref 0.00–0.07)
Basophils Absolute: 0 10*3/uL (ref 0.0–0.1)
Basophils Relative: 1 %
Eosinophils Absolute: 0.1 10*3/uL (ref 0.0–0.5)
Eosinophils Relative: 1 %
HCT: 41 % (ref 39.0–52.0)
Hemoglobin: 14.6 g/dL (ref 13.0–17.0)
Immature Granulocytes: 0 %
Lymphocytes Relative: 19 %
Lymphs Abs: 1.2 10*3/uL (ref 0.7–4.0)
MCH: 34.4 pg — ABNORMAL HIGH (ref 26.0–34.0)
MCHC: 35.6 g/dL (ref 30.0–36.0)
MCV: 96.5 fL (ref 80.0–100.0)
Monocytes Absolute: 0.7 10*3/uL (ref 0.1–1.0)
Monocytes Relative: 11 %
Neutro Abs: 4.3 10*3/uL (ref 1.7–7.7)
Neutrophils Relative %: 68 %
Platelets: 208 10*3/uL (ref 150–400)
RBC: 4.25 MIL/uL (ref 4.22–5.81)
RDW: 12.8 % (ref 11.5–15.5)
WBC: 6.3 10*3/uL (ref 4.0–10.5)
nRBC: 0 % (ref 0.0–0.2)

## 2020-11-20 NOTE — ED Triage Notes (Signed)
Pt bib [facility]. Pt lives in [facility] [place], security at facility states that pt has had increasing weakness and was told by his PCP last week he should moved to assisted living facility. Pt states he is here because he is scared. Pt feels weak and feels like he may fall.  Pt able to move all extremities, states he has had some confusion and is unsure if he took his gabapentin today.

## 2020-11-20 NOTE — Telephone Encounter (Signed)
Received form from nurse/wellness clinic at [facility]- they are trying to help w/ Pt's medications- form faxed back w/ current med list at [phone] (fax failed at this number) so form was resent to [phone]. Fax confirmation received. Form sent for scanning.

## 2020-11-20 NOTE — ED Provider Notes (Addendum)
Emergency Medicine Provider Triage Evaluation Note  Stephen Robbins , a 85 y.o. male  was evaluated in triage.  Pt here by pcp suggestion to evaluate for a potential stroke due to confusion and weakness. Has been confused with his medication and is not sure how adherent he has been. Unable to have MRI due to heart monitor.   Review of Systems  Positive: Left leg weakness, confusion  Negative: Other extremity weakness, falls, facial droop  Physical Exam  BP (!) 111/59 (BP Location: Right Arm)   Pulse 70   Temp 97.6 F (36.4 C) (Oral)   Resp 20   Ht '5\' 9"'$  (1.753 m)   Wt 68 kg   SpO2 99%   BMI 22.15 kg/m  Gen:   Awake, no distress   Resp:  Normal effort  MSK:   Slow movement with left leg. All other extremities normal Other:  No facial droop, normal strength  Medical Decision Making  Medically screening exam initiated at 7:38 PM.  Appropriate orders placed.  [name] was informed that the remainder of the evaluation will be completed by another provider, this initial triage assessment does not replace that evaluation, and the importance of remaining in the ED until their evaluation is complete.     Rhae Hammock, PA-C 11/20/20 1942    Darliss Ridgel 11/20/20 1944    Teressa Lower, MD 11/21/20 [phone]

## 2020-11-21 ENCOUNTER — Encounter (HOSPITAL_COMMUNITY): Payer: Self-pay | Admitting: Student

## 2020-11-21 ENCOUNTER — Telehealth: Payer: Self-pay | Admitting: Neurology

## 2020-11-21 DIAGNOSIS — R531 Weakness: Secondary | ICD-10-CM | POA: Diagnosis not present

## 2020-11-21 LAB — URINALYSIS, ROUTINE W REFLEX MICROSCOPIC
Bacteria, UA: NONE SEEN
Bilirubin Urine: NEGATIVE
Glucose, UA: >=500 mg/dL — AB
Hgb urine dipstick: NEGATIVE
Ketones, ur: NEGATIVE mg/dL
Leukocytes,Ua: NEGATIVE
Nitrite: NEGATIVE
Protein, ur: NEGATIVE mg/dL
Specific Gravity, Urine: 1.032 — ABNORMAL HIGH (ref 1.005–1.030)
pH: 5 (ref 5.0–8.0)

## 2020-11-21 LAB — TSH: TSH: 3.879 u[IU]/mL (ref 0.350–4.500)

## 2020-11-21 NOTE — ED Notes (Signed)
Pt was assisted changing clothes d/t incontinence episode.

## 2020-11-21 NOTE — ED Provider Notes (Signed)
[facility] EMERGENCY DEPARTMENT Provider Note   CSN: [id number] Arrival date & time: 11/20/20  1906     History Chief Complaint  Patient presents with   Weakness    Stephen Robbins is a 85 y.o. male with a hx of parkinson's disease, paroxysmal afib on xarelto, T2DM, hypothyroidism, hyperlipidemia, & SSS S/p pacemaker placement who presents to the ED with complaints of difficulty ambulating and episodes of confusion x 2-3 weeks. Patient reports that he has a hard time walking and that it seems that his legs are weak and he has trouble controlling his motion in them, this has been fairly constant with some episodes during which he feels he cannot move his legs at all. These episodes are typically brief, he has been ambulating with a walker. He further describes his confusion as forgetting to take his medicines appropriately or losing his train of thought. He lives in an apartment at [place] and is trying to avoid moving into assisted living if possible. He has a hx of Parkinson's disease which he suspects may be playing a factor, he is concerned about progression of disease, he is followed by neurologist Dr. Carles Collet, has tremors in the Ues- more so to the LUE, unchanged. Reports his synthroid was recently increased from 50 to 75 mcg, no other med changes. He also mentions at times he gets pain in his left lower back, but this has been going on awhile now. He does have issues controlling his bladder which is also not a new problem. He denies fever, chills, headache, loss of vision, slurred speech, chest pain, abdominal pain, vomiting, melena, dysuria, or syncope. Has baseline neuropathy.   HPI     Past Medical History:  Diagnosis Date   Arthritis    "joints; a little" (01/04/2014)   Asymptomatic bilateral carotid artery stenosis    per duplex  05-15-2012  left >39%/   right 40-59%   Basal cell carcinoma    nose   Borderline diabetes    BPH (benign prostatic hypertrophy) with  urinary obstruction    Bradycardia    Bronchial pneumonia 1958   Chronotropic incompetence with sinus node dysfunction (HCC)    Compressed cervical disc    Compression of lumbar vertebra (HCC)    L4 -- L5   Dizzy    Dysmetabolic syndrome    Fatigue    Frequency of urination    GERD (gastroesophageal reflux disease)    occasional   Hemorrhoids    Hyperlipidemia    Hypothyroidism    Nocturia    NSVT (nonsustained ventricular tachycardia) [facility])    cardiologist-  dr croitoru   Pacemaker    Urgency of urination    Wears glasses     Patient Active Problem List   Diagnosis Date Noted   Primary osteoarthritis of right shoulder 03/16/2019   Diabetes mellitus type 2 in nonobese ([place]) 10/05/2018   PCP NOTES >>>>>>>>. 02/08/2018   Annual physical exam 02/06/2018   Paroxysmal atrial fibrillation ([place]) 02/23/2015   Chronotropic incompetence with sinus node dysfunction [facility])    Pacemaker - [employer] model L121 serial number [id number] 01/04/2014   SSS (sick sinus syndrome) ([place]) 12/20/2013   Chronotropic incompetence with autonomic dysfunction 12/20/2013   Diabetic peripheral neuropathy ([place]) 06/17/2013   Parkinson's disease ([place]) 06/17/2013   NSVT (nonsustained ventricular tachycardia) ([place]) 12/18/2012   Bradycardia 11/26/2012   Dizziness, nonspecific 11/26/2012   CARPAL TUNNEL SYNDROME, BILATERAL 01/23/2010   HEARING LOSS 12/18/2009   Seasonal and  perennial allergic rhinitis 03/26/2008   UNS ADVRS EFF UNS RX MEDICINAL&BIOLOGICAL SBSTNC 09/17/2007   HYPOGONADISM, MALE 04/23/2007   Anxiety and depression 02/10/2007   BENIGN PROSTATIC HYPERTROPHY, WITH OBSTRUCTION 02/10/2007   OSTEOARTHRITIS 02/10/2007   LOW BACK PAIN 02/10/2007   HYPERGLYCEMIA, BORDERLINE 02/10/2007   Hypothyroidism 10/14/2006   Dyslipidemia 09/18/2006    Past Surgical History:  Procedure Laterality Date   BASAL CELL CARCINOMA EXCISION     nose   CLOSED REDUCTION NASAL FRACTURE  09-01-2007    CYSTOSCOPY N/A 12/28/2012   Procedure: CYSTOSCOPY;  Surgeon: Bernestine Amass, MD;  Location: [facility];  Service: Urology;  Laterality: N/A;   EXERCISE TOLERENCE TEST  12-03-2012  DR CROITURO   CHRONOTROPIC INCOMPETENCE/ NORMAL RESTING BP W/ APPROPRIATE RESPONSE/ NO CHEST PAIN/ NO ST CHANGES FROM BASELINE   INSERT / REPLACE / REMOVE PACEMAKER  01/04/2014   [employer] model L121 serial number [id number]   LACERATION REPAIR Right 1978   middle finger   NEUROPLASTY / TRANSPOSITION ULNAR NERVE AT ELBOW Left 02-09-2010   PERMANENT PACEMAKER INSERTION N/A 01/04/2014   Procedure: PERMANENT PACEMAKER INSERTION;  Surgeon: Sanda Klein, MD;  Location: [facility]ATH [facility];  Service: Cardiovascular;  Laterality: N/A;   SKIN BIOPSY     scc   TONSILLECTOMY AND ADENOIDECTOMY  1944   TRANSURETHRAL INCISION OF PROSTATE N/A 12/28/2012   Procedure: TRANSURETHRAL INCISION OF THE PROSTATE (TUIP);  Surgeon: Bernestine Amass, MD;  Location: [facility];  Service: Urology;  Laterality: N/A;   ULNAR NERVE TRANSPOSITION         Family History  Problem Relation Age of Onset   Stroke Father    Lung cancer Mother    Parkinson's disease Brother    Heart Problems Brother     Social History   Tobacco Use   Smoking status: Former    Packs/day: 1.00    Years: 10.00    Pack years: 10.00    Types: Cigarettes    Quit date: 03/25/1968    Years since quitting: 52.6   Smokeless tobacco: Never  Vaping Use   Vaping Use: Never used  Substance Use Topics   Alcohol use: Yes    Alcohol/week: 7.0 standard drinks    Types: 7 Glasses of wine per week    Comment: 1 glass wine daily   Drug use: No    Home Medications Prior to Admission medications   Medication Sig Start Date End Date Taking? Authorizing Provider  ACCU-CHEK FASTCLIX LANCETS MISC Check blood sugar three times daily Patient not taking: Reported on 11/15/2020 02/12/18   Colon Branch, MD  bisacodyl (DULCOLAX) 5 MG EC  tablet Take 5 mg daily as needed by mouth for moderate constipation. Patient not taking: No sig reported    [provider]  Blood Glucose Monitoring Suppl (ACCU-CHEK GUIDE ME) w/Device KIT 1 Device by Does not apply route as directed. Patient not taking: Reported on 11/15/2020 02/09/18   Colon Branch, MD  Blood Pressure KIT Take blood pressure twice weekly. Patient not taking: Reported on 11/15/2020 09/01/19   Colon Branch, MD  buPROPion [facility]) 75 MG tablet Take 1 tablet (75 mg total) by mouth 2 (two) times daily. 11/09/20   Colon Branch, MD  carbidopa-levodopa (SINEMET IR) 25-100 MG tablet TAKE 2 TABLETS BY MOUTH THREE TIMES DAILY 09/01/20   [name], [name], DO  Continuous Blood Gluc Receiver (FREESTYLE LIBRE 14 DAY READER) DEVI Check blood sugars daily as directed  Patient not taking: Reported on 11/15/2020 12/07/19   Colon Branch, MD  Continuous Blood Gluc Sensor (FREESTYLE LIBRE 14 DAY SENSOR) MISC Check blood sugars once daily as directed Patient not taking: Reported on 11/15/2020 12/07/19   Colon Branch, MD  dapagliflozin propanediol (FARXIGA) 10 MG TABS tablet Take 1 tablet (10 mg total) by mouth daily. 07/10/20   Colon Branch, MD  gabapentin (NEURONTIN) 300 MG capsule TAKE 1 CAPSULE(300 MG) BY MOUTH TWICE DAILY 07/10/20   [name], [name], DO  GEMTESA 75 MG TABS Take 1 tablet by mouth daily. 11/02/20   [provider]  glucose blood (ACCU-CHEK GUIDE) test strip Check blood sugar three times daily Patient not taking: Reported on 11/15/2020 02/09/18   Colon Branch, MD  levothyroxine (SYNTHROID) 75 MCG tablet Take 1 tablet (75 mcg total) by mouth daily before breakfast. 07/10/20   Colon Branch, MD  metFORMIN (GLUCOPHAGE) 1000 MG tablet TAKE 1 TABLET(1000 MG) BY MOUTH TWICE DAILY WITH A MEAL 10/10/20   Paz, [name], MD  polyethylene glycol powder [facility]) powder Take 17 g daily by mouth. 02/04/17   Marletta Lor, MD  sertraline (ZOLOFT) 100 MG tablet Take 1 tablet (100 mg total) by mouth  daily. 04/05/20   Colon Branch, MD  simvastatin (ZOCOR) 40 MG tablet Take 1 tablet (40 mg total) by mouth at bedtime. 10/23/20   Colon Branch, MD  tamsulosin (FLOMAX) 0.4 MG CAPS capsule Take 0.4 mg by mouth daily. 04/03/20   [provider]  vitamin B-12 (CYANOCOBALAMIN) 1000 MCG tablet Take 1,000 mcg by mouth every morning.     [provider]  VITAMIN D, ERGOCALCIFEROL, PO Take by mouth.    [provider]  XARELTO 20 MG TABS tablet TAKE 1 TABLET(20 MG) BY MOUTH DAILY 07/10/20   Croitoru, [name], MD    Allergies    Covid-19 mrna vacc (moderna) and Penicillins  Review of Systems   Review of Systems  Constitutional:  Negative for chills and fever.  HENT:  Negative for congestion, ear pain and sore throat.   Respiratory:  Positive for cough (occasional, dry). Negative for shortness of breath.   Cardiovascular:  Negative for chest pain and leg swelling.  Gastrointestinal:  Negative for abdominal pain, blood in stool and vomiting.  Genitourinary:  Negative for dysuria.  Musculoskeletal:  Positive for back pain and gait problem.  Neurological:  Positive for weakness. Negative for syncope.  Psychiatric/Behavioral:  Positive for confusion.   All other systems reviewed and are negative.  Physical Exam Updated Vital Signs BP 123/74 (BP Location: Left Arm)   Pulse (!) 59   Temp 98 F (36.7 C) (Oral)   Resp 14   Ht _0  (1.753 m)   Wt 68 kg   SpO2 100%   BMI 22.15 kg/m   Physical Exam Vitals and nursing note reviewed.  Constitutional:      General: He is not in acute distress.    Appearance: He is not toxic-appearing.  HENT:     Head: Normocephalic and atraumatic.  Eyes:     Extraocular Movements: Extraocular movements intact.     Pupils: Pupils are equal, round, and reactive to light.  Cardiovascular:     Rate and Rhythm: Normal rate and regular rhythm.     Pulses:          Dorsalis pedis pulses are 2+ on the right side and 2+ on the left side.  Posterior tibial pulses are 2+ on the right side and 2+ on the left side.  Pulmonary:     Effort: Pulmonary effort is normal. No respiratory distress.     Breath sounds: No stridor. No wheezing, rhonchi or rales.  Abdominal:     General: There is no distension.     Palpations: Abdomen is soft.     Tenderness: There is no abdominal tenderness. There is no guarding or rebound.  Musculoskeletal:     Cervical back: Neck supple. No rigidity.     Comments: Left lumbar paraspinal muscle tenderness. No midline spinal tenderness.   Skin:    General: Skin is warm and dry.  Neurological:     Mental Status: He is alert.     Comments: Clear speech. Oriented x 4. Cn III-XII grossly intact. Sensation grossly intact to bilateral upper/lower extremities with the exception of bilateral feet with decreased sensation which patient reports is consistent w/ his neuropathy. He has some global weakness, but has good grip strength and is able to flex/extend the knees and plantar/dorsiflex the ankles against resistance LUE is tremulous at times.   Psychiatric:        Mood and Affect: Mood normal.        Behavior: Behavior normal.    ED Results / Procedures / Treatments   Labs (all labs ordered are listed, but only abnormal results are displayed) Labs Reviewed  BASIC METABOLIC PANEL - Abnormal; Notable for the following components:      Result Value   Glucose, Bld 130 (*)    All other components within normal limits  CBC WITH DIFFERENTIAL/PLATELET - Abnormal; Notable for the following components:   MCH 34.4 (*)    All other components within normal limits  URINALYSIS, ROUTINE W REFLEX MICROSCOPIC - Abnormal; Notable for the following components:   APPearance HAZY (*)    Specific Gravity, Urine 1.032 (*)    Glucose, UA >=500 (*)    All other components within normal limits    EKG None  Radiology CT HEAD WO CONTRAST (5MM)  Result Date: 11/20/2020 CLINICAL DATA:  Delirium. EXAM: CT HEAD WITHOUT  CONTRAST TECHNIQUE: Contiguous axial images were obtained from the base of the skull through the vertex without intravenous contrast. COMPARISON:  Head CT dated 08/22/2007. FINDINGS: Brain: Mild age-related atrophy and chronic microvascular ischemic changes. There is no acute intracranial hemorrhage. No mass effect or midline shift. No extra-axial fluid collection. Vascular: No hyperdense vessel or unexpected calcification. Skull: Normal. Negative for fracture or focal lesion. Sinuses/Orbits: No acute finding. Other: None IMPRESSION: 1. No acute intracranial pathology. 2. Mild age-related atrophy and chronic microvascular ischemic changes. Electronically Signed   By: Anner Crete M.D.   On: 11/20/2020 21:10    Procedures Procedures   Medications Ordered in ED Medications - No data to display  ED Course  I have reviewed the triage vital signs and the nursing notes.  Pertinent labs & imaging results that were available during my care of the patient were reviewed by me and considered in my medical decision making (see chart for details).    MDM Rules/Calculators/A&P                           Patient presents to the ED with complaints of problems with gait over the past few weeks. Nontoxic, vitals without significant abnormality. On exam patient is globally weak, LUE tremor noted, and left lumbar paraspinal tenderness noted, no midline spinal  tenderness.   Additional history obtained:  Additional history obtained from chart review & nursing note review.   Lab Tests:  I reviewed and interpreted labs, which included:  CBC: Unremarakble.  BMP: mild hyperglycemia no significant metabolic derangement.  UA: Glucosuria, no ketonuria, no UTI.   Imaging Studies ordered:  CT head ordered in triage, I independently reviewed, formal radiology impression shows: 1. No acute intracranial pathology. 2. Mild age-related atrophy and chronic microvascular ischemic changes  ED Course:  Overall  reassuring ED work-up. No significant electrolyte derangement or thyroid dysfunction, no obvious signs of infection, and no head bleed or acute process on CT head. Patient does not have clear unilateral deficits and sxs have been going on for a few weeks now without acute infarct on CT therefore feel that CVA is unlikely.  Mentions some back pain for awhile now, exam does not seem consistent w/ acute cauda equina syndrome given intact strength, baseline sensation and able to ambulate. The patient is ambulatory with his walker in the ED. Overall favor patient's ambulatory sxs to be parkinson's disease related.   09:24: CONSULT: Discussed with neurologist Dr. Quinn Axe given concern for parkinson progression, would not recommend medication adjustment from the emergency dept, close outpatient follow up.   Case management team was involved in care- orders placed to assist with home health needs as patient would prefer to avoid assisted living transition. Patient overall states he is ready to go home and would like to be discharged which seems reasonable at this time. I discussed results, treatment plan, need for follow-up, and return precautions with the patient. Provided opportunity for questions, patient confirmed understanding and is in agreement with plan.   This is a shared visit with supervising physician Dr. Armandina Gemma who has independently evaluated patient & provided guidance in evaluation/management/disposition, in agreement with care   Portions of this note were generated with Dragon dictation software. Dictation errors may occur despite best attempts at proofreading.  Final Clinical Impression(s) / ED Diagnoses Final diagnoses:  Weakness    Rx / DC Orders ED Discharge Orders     None        Amaryllis Dyke, PA-C 11/21/20 1255    Regan Lemming, MD 11/21/20 2115

## 2020-11-21 NOTE — Discharge Instructions (Addendum)
You were seen in the emergency department today due to difficulty walking and weakness.  Your blood work was overall reassuring.  Your CT scan of your head did not show any acute problems.  We have placed a consult to our case management team to try to help with home health for you which will be coordinated by Rhodia Albright.  Please call your neurologist to schedule an appointment for follow-up as soon as possible. Return to the emergency department for any new or worsening symptoms including but not limited to new or worsening weakness, new or worsening pain,  falls, chest pain, trouble breathing, fever, passing out, abdominal pain, or any other concerns   Results for orders placed or performed during the hospital encounter of [id number]  Basic metabolic panel  Result Value Ref Range   Sodium 137 135 - 145 mmol/L   Potassium 3.6 3.5 - 5.1 mmol/L   Chloride 103 98 - 111 mmol/L   CO2 26 22 - 32 mmol/L   Glucose, Bld 130 (H) 70 - 99 mg/dL   BUN 16 8 - 23 mg/dL   Creatinine, Ser 0.73 0.61 - 1.24 mg/dL   Calcium 9.4 8.9 - 10.3 mg/dL   GFR, Estimated >60 >60 mL/min   Anion gap 8 5 - 15  CBC with Differential  Result Value Ref Range   WBC 6.3 4.0 - 10.5 K/uL   RBC 4.25 4.22 - 5.81 MIL/uL   Hemoglobin 14.6 13.0 - 17.0 g/dL   HCT 41.0 39.0 - 52.0 %   MCV 96.5 80.0 - 100.0 fL   MCH 34.4 (H) 26.0 - 34.0 pg   MCHC 35.6 30.0 - 36.0 g/dL   RDW 12.8 11.5 - 15.5 %   Platelets 208 150 - 400 K/uL   nRBC 0.0 0.0 - 0.2 %   Neutrophils Relative % 68 %   Neutro Abs 4.3 1.7 - 7.7 K/uL   Lymphocytes Relative 19 %   Lymphs Abs 1.2 0.7 - 4.0 K/uL   Monocytes Relative 11 %   Monocytes Absolute 0.7 0.1 - 1.0 K/uL   Eosinophils Relative 1 %   Eosinophils Absolute 0.1 0.0 - 0.5 K/uL   Basophils Relative 1 %   Basophils Absolute 0.0 0.0 - 0.1 K/uL   Immature Granulocytes 0 %   Abs Immature Granulocytes 0.02 0.00 - 0.07 K/uL  Urinalysis, Routine w reflex microscopic Urine, Clean Catch  Result Value Ref  Range   Color, Urine YELLOW YELLOW   APPearance HAZY (A) CLEAR   Specific Gravity, Urine 1.032 (H) 1.005 - 1.030   pH 5.0 5.0 - 8.0   Glucose, UA >=500 (A) NEGATIVE mg/dL   Hgb urine dipstick NEGATIVE NEGATIVE   Bilirubin Urine NEGATIVE NEGATIVE   Ketones, ur NEGATIVE NEGATIVE mg/dL   Protein, ur NEGATIVE NEGATIVE mg/dL   Nitrite NEGATIVE NEGATIVE   Leukocytes,Ua NEGATIVE NEGATIVE   RBC / HPF 0-5 0 - 5 RBC/hpf   WBC, UA 0-5 0 - 5 WBC/hpf   Bacteria, UA NONE SEEN NONE SEEN   Mucus PRESENT    Ca Oxalate Crys, UA PRESENT   TSH  Result Value Ref Range   TSH 3.879 0.350 - 4.500 uIU/mL   CT HEAD WO CONTRAST (5MM)  Result Date: 11/20/2020 CLINICAL DATA:  Delirium. EXAM: CT HEAD WITHOUT CONTRAST TECHNIQUE: Contiguous axial images were obtained from the base of the skull through the vertex without intravenous contrast. COMPARISON:  Head CT dated 08/22/2007. FINDINGS: Brain: Mild age-related atrophy and chronic microvascular ischemic changes.  There is no acute intracranial hemorrhage. No mass effect or midline shift. No extra-axial fluid collection. Vascular: No hyperdense vessel or unexpected calcification. Skull: Normal. Negative for fracture or focal lesion. Sinuses/Orbits: No acute finding. Other: None IMPRESSION: 1. No acute intracranial pathology. 2. Mild age-related atrophy and chronic microvascular ischemic changes. Electronically Signed   By: Anner Crete M.D.   On: 11/20/2020 21:10

## 2020-11-21 NOTE — Telephone Encounter (Signed)
PCP asked for earlier appt but said no concern for stroke - concern for progression of Parkinsons Disease.  Pt subsequently went to ED yesterday with c/o weakness and said that was concerned about Parkinsons Disease.  ED did CT brain (neg) and sent me message to work patient in earlier.  Pt offered appt tomorrow but declined that.  Stated he was too tired after ED visit.

## 2020-11-21 NOTE — Discharge Planning (Signed)
RNCM consulted regarding home health needs.  Pt resides at [place] [facility] reached out to medical team there to find that they are arranging home health medical services and will need order for any therapy.

## 2020-11-21 NOTE — ED Notes (Signed)
Pt ambulated with walker & was steady on feet

## 2020-11-21 NOTE — ED Notes (Signed)
Kathrine Haddock niece [phone] would like an update on the patient

## 2020-11-22 ENCOUNTER — Telehealth: Payer: Self-pay | Admitting: Internal Medicine

## 2020-11-22 NOTE — Telephone Encounter (Signed)
Forms received earlier this morning, they are in PCP red folder to be signed.

## 2020-11-22 NOTE — Telephone Encounter (Signed)
Samantha from [facility] (the place where the patient lives) called in regard of a fax sent over to [place] requiring a signature. She stated the form is called  FL2  and it's 25 pages, but just the first 2 needed to be sent back signed. She wants to know the status of those papers since they are trying to move the patient. Please advice.

## 2020-11-23 ENCOUNTER — Telehealth: Payer: Self-pay

## 2020-11-23 DIAGNOSIS — R2681 Unsteadiness on feet: Secondary | ICD-10-CM | POA: Diagnosis not present

## 2020-11-23 DIAGNOSIS — G20 Parkinson's disease: Secondary | ICD-10-CM | POA: Diagnosis not present

## 2020-11-23 DIAGNOSIS — D649 Anemia, unspecified: Secondary | ICD-10-CM | POA: Diagnosis not present

## 2020-11-23 DIAGNOSIS — I10 Essential (primary) hypertension: Secondary | ICD-10-CM | POA: Diagnosis not present

## 2020-11-23 DIAGNOSIS — R269 Unspecified abnormalities of gait and mobility: Secondary | ICD-10-CM | POA: Diagnosis not present

## 2020-11-23 MED ORDER — GABAPENTIN 300 MG PO CAPS: ORAL_CAPSULE | ORAL | 0 refills | Status: DC

## 2020-11-23 NOTE — Telephone Encounter (Signed)
Refill sent to last until follow up 01/16/21

## 2020-11-24 DIAGNOSIS — G20 Parkinson's disease: Secondary | ICD-10-CM | POA: Diagnosis not present

## 2020-11-24 DIAGNOSIS — Z7409 Other reduced mobility: Secondary | ICD-10-CM | POA: Diagnosis not present

## 2020-11-24 DIAGNOSIS — Z7984 Long term (current) use of oral hypoglycemic drugs: Secondary | ICD-10-CM | POA: Diagnosis not present

## 2020-11-24 DIAGNOSIS — R531 Weakness: Secondary | ICD-10-CM | POA: Diagnosis not present

## 2020-11-24 DIAGNOSIS — R2681 Unsteadiness on feet: Secondary | ICD-10-CM | POA: Diagnosis not present

## 2020-11-24 DIAGNOSIS — E039 Hypothyroidism, unspecified: Secondary | ICD-10-CM | POA: Diagnosis not present

## 2020-11-24 DIAGNOSIS — E119 Type 2 diabetes mellitus without complications: Secondary | ICD-10-CM | POA: Diagnosis not present

## 2020-11-24 DIAGNOSIS — I482 Chronic atrial fibrillation, unspecified: Secondary | ICD-10-CM | POA: Diagnosis not present

## 2020-11-24 DIAGNOSIS — R269 Unspecified abnormalities of gait and mobility: Secondary | ICD-10-CM | POA: Diagnosis not present

## 2020-11-24 DIAGNOSIS — F339 Major depressive disorder, recurrent, unspecified: Secondary | ICD-10-CM | POA: Diagnosis not present

## 2020-11-24 DIAGNOSIS — N400 Enlarged prostate without lower urinary tract symptoms: Secondary | ICD-10-CM | POA: Diagnosis not present

## 2020-11-26 DIAGNOSIS — R2681 Unsteadiness on feet: Secondary | ICD-10-CM | POA: Diagnosis not present

## 2020-11-26 DIAGNOSIS — R269 Unspecified abnormalities of gait and mobility: Secondary | ICD-10-CM | POA: Diagnosis not present

## 2020-11-26 DIAGNOSIS — G20 Parkinson's disease: Secondary | ICD-10-CM | POA: Diagnosis not present

## 2020-11-27 DIAGNOSIS — G20 Parkinson's disease: Secondary | ICD-10-CM | POA: Diagnosis not present

## 2020-11-27 DIAGNOSIS — R269 Unspecified abnormalities of gait and mobility: Secondary | ICD-10-CM | POA: Diagnosis not present

## 2020-11-27 DIAGNOSIS — R2681 Unsteadiness on feet: Secondary | ICD-10-CM | POA: Diagnosis not present

## 2020-11-28 DIAGNOSIS — R498 Other voice and resonance disorders: Secondary | ICD-10-CM | POA: Diagnosis not present

## 2020-11-28 DIAGNOSIS — I480 Paroxysmal atrial fibrillation: Secondary | ICD-10-CM | POA: Diagnosis not present

## 2020-11-28 DIAGNOSIS — M6281 Muscle weakness (generalized): Secondary | ICD-10-CM | POA: Diagnosis not present

## 2020-11-28 DIAGNOSIS — R2681 Unsteadiness on feet: Secondary | ICD-10-CM | POA: Diagnosis not present

## 2020-11-28 DIAGNOSIS — R41841 Cognitive communication deficit: Secondary | ICD-10-CM | POA: Diagnosis not present

## 2020-11-28 DIAGNOSIS — F419 Anxiety disorder, unspecified: Secondary | ICD-10-CM | POA: Diagnosis not present

## 2020-11-28 DIAGNOSIS — R269 Unspecified abnormalities of gait and mobility: Secondary | ICD-10-CM | POA: Diagnosis not present

## 2020-11-28 DIAGNOSIS — G20 Parkinson's disease: Secondary | ICD-10-CM | POA: Diagnosis not present

## 2020-11-28 NOTE — Telephone Encounter (Signed)
Form faxed back to [place] at clinic fax [phone]).

## 2020-11-28 NOTE — Telephone Encounter (Signed)
Fax failed to clinic fax. Form faxed to main fax line at [phone].

## 2020-11-28 NOTE — Telephone Encounter (Signed)
Both fax numbers have failed x2. Copy of form made and will mail form back to [place] at [address] [place] [address].

## 2020-11-29 DIAGNOSIS — R2681 Unsteadiness on feet: Secondary | ICD-10-CM | POA: Diagnosis not present

## 2020-11-29 DIAGNOSIS — R269 Unspecified abnormalities of gait and mobility: Secondary | ICD-10-CM | POA: Diagnosis not present

## 2020-11-29 DIAGNOSIS — G20 Parkinson's disease: Secondary | ICD-10-CM | POA: Diagnosis not present

## 2020-11-29 DIAGNOSIS — M6281 Muscle weakness (generalized): Secondary | ICD-10-CM | POA: Diagnosis not present

## 2020-11-29 DIAGNOSIS — F419 Anxiety disorder, unspecified: Secondary | ICD-10-CM | POA: Diagnosis not present

## 2020-11-29 DIAGNOSIS — I480 Paroxysmal atrial fibrillation: Secondary | ICD-10-CM | POA: Diagnosis not present

## 2020-11-30 DIAGNOSIS — F419 Anxiety disorder, unspecified: Secondary | ICD-10-CM | POA: Diagnosis not present

## 2020-11-30 DIAGNOSIS — G20 Parkinson's disease: Secondary | ICD-10-CM | POA: Diagnosis not present

## 2020-11-30 DIAGNOSIS — M6281 Muscle weakness (generalized): Secondary | ICD-10-CM | POA: Diagnosis not present

## 2020-11-30 DIAGNOSIS — R269 Unspecified abnormalities of gait and mobility: Secondary | ICD-10-CM | POA: Diagnosis not present

## 2020-11-30 DIAGNOSIS — I480 Paroxysmal atrial fibrillation: Secondary | ICD-10-CM | POA: Diagnosis not present

## 2020-11-30 DIAGNOSIS — R2681 Unsteadiness on feet: Secondary | ICD-10-CM | POA: Diagnosis not present

## 2020-12-01 DIAGNOSIS — R269 Unspecified abnormalities of gait and mobility: Secondary | ICD-10-CM | POA: Diagnosis not present

## 2020-12-01 DIAGNOSIS — F419 Anxiety disorder, unspecified: Secondary | ICD-10-CM | POA: Diagnosis not present

## 2020-12-01 DIAGNOSIS — R2681 Unsteadiness on feet: Secondary | ICD-10-CM | POA: Diagnosis not present

## 2020-12-01 DIAGNOSIS — I480 Paroxysmal atrial fibrillation: Secondary | ICD-10-CM | POA: Diagnosis not present

## 2020-12-01 DIAGNOSIS — M6281 Muscle weakness (generalized): Secondary | ICD-10-CM | POA: Diagnosis not present

## 2020-12-01 DIAGNOSIS — G20 Parkinson's disease: Secondary | ICD-10-CM | POA: Diagnosis not present

## 2020-12-02 ENCOUNTER — Other Ambulatory Visit: Payer: Self-pay | Admitting: Neurology

## 2020-12-02 DIAGNOSIS — G20 Parkinson's disease: Secondary | ICD-10-CM

## 2020-12-04 ENCOUNTER — Other Ambulatory Visit: Payer: Self-pay

## 2020-12-04 DIAGNOSIS — M6281 Muscle weakness (generalized): Secondary | ICD-10-CM | POA: Diagnosis not present

## 2020-12-04 DIAGNOSIS — F419 Anxiety disorder, unspecified: Secondary | ICD-10-CM | POA: Diagnosis not present

## 2020-12-04 DIAGNOSIS — I480 Paroxysmal atrial fibrillation: Secondary | ICD-10-CM | POA: Diagnosis not present

## 2020-12-04 DIAGNOSIS — R2681 Unsteadiness on feet: Secondary | ICD-10-CM | POA: Diagnosis not present

## 2020-12-04 DIAGNOSIS — G20 Parkinson's disease: Secondary | ICD-10-CM | POA: Diagnosis not present

## 2020-12-04 DIAGNOSIS — R269 Unspecified abnormalities of gait and mobility: Secondary | ICD-10-CM | POA: Diagnosis not present

## 2020-12-05 DIAGNOSIS — R269 Unspecified abnormalities of gait and mobility: Secondary | ICD-10-CM | POA: Diagnosis not present

## 2020-12-05 DIAGNOSIS — F419 Anxiety disorder, unspecified: Secondary | ICD-10-CM | POA: Diagnosis not present

## 2020-12-05 DIAGNOSIS — I480 Paroxysmal atrial fibrillation: Secondary | ICD-10-CM | POA: Diagnosis not present

## 2020-12-05 DIAGNOSIS — G20 Parkinson's disease: Secondary | ICD-10-CM | POA: Diagnosis not present

## 2020-12-05 DIAGNOSIS — M6281 Muscle weakness (generalized): Secondary | ICD-10-CM | POA: Diagnosis not present

## 2020-12-05 DIAGNOSIS — R2681 Unsteadiness on feet: Secondary | ICD-10-CM | POA: Diagnosis not present

## 2020-12-06 DIAGNOSIS — I480 Paroxysmal atrial fibrillation: Secondary | ICD-10-CM | POA: Diagnosis not present

## 2020-12-06 DIAGNOSIS — R269 Unspecified abnormalities of gait and mobility: Secondary | ICD-10-CM | POA: Diagnosis not present

## 2020-12-06 DIAGNOSIS — F419 Anxiety disorder, unspecified: Secondary | ICD-10-CM | POA: Diagnosis not present

## 2020-12-06 DIAGNOSIS — R2681 Unsteadiness on feet: Secondary | ICD-10-CM | POA: Diagnosis not present

## 2020-12-06 DIAGNOSIS — G20 Parkinson's disease: Secondary | ICD-10-CM | POA: Diagnosis not present

## 2020-12-06 DIAGNOSIS — M6281 Muscle weakness (generalized): Secondary | ICD-10-CM | POA: Diagnosis not present

## 2020-12-07 ENCOUNTER — Other Ambulatory Visit: Payer: Self-pay | Admitting: Internal Medicine

## 2020-12-07 DIAGNOSIS — R269 Unspecified abnormalities of gait and mobility: Secondary | ICD-10-CM | POA: Diagnosis not present

## 2020-12-07 DIAGNOSIS — R2681 Unsteadiness on feet: Secondary | ICD-10-CM | POA: Diagnosis not present

## 2020-12-07 DIAGNOSIS — G20 Parkinson's disease: Secondary | ICD-10-CM | POA: Diagnosis not present

## 2020-12-07 DIAGNOSIS — F419 Anxiety disorder, unspecified: Secondary | ICD-10-CM | POA: Diagnosis not present

## 2020-12-07 DIAGNOSIS — I480 Paroxysmal atrial fibrillation: Secondary | ICD-10-CM | POA: Diagnosis not present

## 2020-12-07 DIAGNOSIS — M6281 Muscle weakness (generalized): Secondary | ICD-10-CM | POA: Diagnosis not present

## 2020-12-08 DIAGNOSIS — R269 Unspecified abnormalities of gait and mobility: Secondary | ICD-10-CM | POA: Diagnosis not present

## 2020-12-08 DIAGNOSIS — F419 Anxiety disorder, unspecified: Secondary | ICD-10-CM | POA: Diagnosis not present

## 2020-12-08 DIAGNOSIS — I480 Paroxysmal atrial fibrillation: Secondary | ICD-10-CM | POA: Diagnosis not present

## 2020-12-08 DIAGNOSIS — G20 Parkinson's disease: Secondary | ICD-10-CM | POA: Diagnosis not present

## 2020-12-08 DIAGNOSIS — M6281 Muscle weakness (generalized): Secondary | ICD-10-CM | POA: Diagnosis not present

## 2020-12-08 DIAGNOSIS — R2681 Unsteadiness on feet: Secondary | ICD-10-CM | POA: Diagnosis not present

## 2020-12-09 DIAGNOSIS — R269 Unspecified abnormalities of gait and mobility: Secondary | ICD-10-CM | POA: Diagnosis not present

## 2020-12-09 DIAGNOSIS — M6281 Muscle weakness (generalized): Secondary | ICD-10-CM | POA: Diagnosis not present

## 2020-12-09 DIAGNOSIS — F419 Anxiety disorder, unspecified: Secondary | ICD-10-CM | POA: Diagnosis not present

## 2020-12-09 DIAGNOSIS — I480 Paroxysmal atrial fibrillation: Secondary | ICD-10-CM | POA: Diagnosis not present

## 2020-12-09 DIAGNOSIS — G20 Parkinson's disease: Secondary | ICD-10-CM | POA: Diagnosis not present

## 2020-12-09 DIAGNOSIS — R2681 Unsteadiness on feet: Secondary | ICD-10-CM | POA: Diagnosis not present

## 2020-12-11 DIAGNOSIS — M6281 Muscle weakness (generalized): Secondary | ICD-10-CM | POA: Diagnosis not present

## 2020-12-11 DIAGNOSIS — G20 Parkinson's disease: Secondary | ICD-10-CM | POA: Diagnosis not present

## 2020-12-11 DIAGNOSIS — I480 Paroxysmal atrial fibrillation: Secondary | ICD-10-CM | POA: Diagnosis not present

## 2020-12-11 DIAGNOSIS — R2681 Unsteadiness on feet: Secondary | ICD-10-CM | POA: Diagnosis not present

## 2020-12-11 DIAGNOSIS — R269 Unspecified abnormalities of gait and mobility: Secondary | ICD-10-CM | POA: Diagnosis not present

## 2020-12-11 DIAGNOSIS — F419 Anxiety disorder, unspecified: Secondary | ICD-10-CM | POA: Diagnosis not present

## 2020-12-12 DIAGNOSIS — F419 Anxiety disorder, unspecified: Secondary | ICD-10-CM | POA: Diagnosis not present

## 2020-12-12 DIAGNOSIS — I480 Paroxysmal atrial fibrillation: Secondary | ICD-10-CM | POA: Diagnosis not present

## 2020-12-12 DIAGNOSIS — G20 Parkinson's disease: Secondary | ICD-10-CM | POA: Diagnosis not present

## 2020-12-12 DIAGNOSIS — R2681 Unsteadiness on feet: Secondary | ICD-10-CM | POA: Diagnosis not present

## 2020-12-12 DIAGNOSIS — M6281 Muscle weakness (generalized): Secondary | ICD-10-CM | POA: Diagnosis not present

## 2020-12-12 DIAGNOSIS — R269 Unspecified abnormalities of gait and mobility: Secondary | ICD-10-CM | POA: Diagnosis not present

## 2020-12-13 DIAGNOSIS — R2681 Unsteadiness on feet: Secondary | ICD-10-CM | POA: Diagnosis not present

## 2020-12-13 DIAGNOSIS — R269 Unspecified abnormalities of gait and mobility: Secondary | ICD-10-CM | POA: Diagnosis not present

## 2020-12-13 DIAGNOSIS — F419 Anxiety disorder, unspecified: Secondary | ICD-10-CM | POA: Diagnosis not present

## 2020-12-13 DIAGNOSIS — M6281 Muscle weakness (generalized): Secondary | ICD-10-CM | POA: Diagnosis not present

## 2020-12-13 DIAGNOSIS — G20 Parkinson's disease: Secondary | ICD-10-CM | POA: Diagnosis not present

## 2020-12-13 DIAGNOSIS — I480 Paroxysmal atrial fibrillation: Secondary | ICD-10-CM | POA: Diagnosis not present

## 2020-12-14 DIAGNOSIS — G20 Parkinson's disease: Secondary | ICD-10-CM | POA: Diagnosis not present

## 2020-12-14 DIAGNOSIS — I480 Paroxysmal atrial fibrillation: Secondary | ICD-10-CM | POA: Diagnosis not present

## 2020-12-14 DIAGNOSIS — R2681 Unsteadiness on feet: Secondary | ICD-10-CM | POA: Diagnosis not present

## 2020-12-14 DIAGNOSIS — M6281 Muscle weakness (generalized): Secondary | ICD-10-CM | POA: Diagnosis not present

## 2020-12-14 DIAGNOSIS — F419 Anxiety disorder, unspecified: Secondary | ICD-10-CM | POA: Diagnosis not present

## 2020-12-14 DIAGNOSIS — R269 Unspecified abnormalities of gait and mobility: Secondary | ICD-10-CM | POA: Diagnosis not present

## 2020-12-15 DIAGNOSIS — F419 Anxiety disorder, unspecified: Secondary | ICD-10-CM | POA: Diagnosis not present

## 2020-12-15 DIAGNOSIS — I480 Paroxysmal atrial fibrillation: Secondary | ICD-10-CM | POA: Diagnosis not present

## 2020-12-15 DIAGNOSIS — R269 Unspecified abnormalities of gait and mobility: Secondary | ICD-10-CM | POA: Diagnosis not present

## 2020-12-15 DIAGNOSIS — G20 Parkinson's disease: Secondary | ICD-10-CM | POA: Diagnosis not present

## 2020-12-15 DIAGNOSIS — R2681 Unsteadiness on feet: Secondary | ICD-10-CM | POA: Diagnosis not present

## 2020-12-15 DIAGNOSIS — M6281 Muscle weakness (generalized): Secondary | ICD-10-CM | POA: Diagnosis not present

## 2020-12-18 DIAGNOSIS — G20 Parkinson's disease: Secondary | ICD-10-CM | POA: Diagnosis not present

## 2020-12-18 DIAGNOSIS — R2681 Unsteadiness on feet: Secondary | ICD-10-CM | POA: Diagnosis not present

## 2020-12-18 DIAGNOSIS — F419 Anxiety disorder, unspecified: Secondary | ICD-10-CM | POA: Diagnosis not present

## 2020-12-18 DIAGNOSIS — I480 Paroxysmal atrial fibrillation: Secondary | ICD-10-CM | POA: Diagnosis not present

## 2020-12-18 DIAGNOSIS — R269 Unspecified abnormalities of gait and mobility: Secondary | ICD-10-CM | POA: Diagnosis not present

## 2020-12-18 DIAGNOSIS — M6281 Muscle weakness (generalized): Secondary | ICD-10-CM | POA: Diagnosis not present

## 2020-12-21 DIAGNOSIS — R260 Ataxic gait: Secondary | ICD-10-CM | POA: Diagnosis not present

## 2020-12-21 DIAGNOSIS — G20 Parkinson's disease: Secondary | ICD-10-CM | POA: Diagnosis not present

## 2020-12-21 DIAGNOSIS — R2681 Unsteadiness on feet: Secondary | ICD-10-CM | POA: Diagnosis not present

## 2020-12-21 DIAGNOSIS — M6281 Muscle weakness (generalized): Secondary | ICD-10-CM | POA: Diagnosis not present

## 2020-12-21 DIAGNOSIS — R2689 Other abnormalities of gait and mobility: Secondary | ICD-10-CM | POA: Diagnosis not present

## 2020-12-22 DIAGNOSIS — R2681 Unsteadiness on feet: Secondary | ICD-10-CM | POA: Diagnosis not present

## 2020-12-22 DIAGNOSIS — M6281 Muscle weakness (generalized): Secondary | ICD-10-CM | POA: Diagnosis not present

## 2020-12-22 DIAGNOSIS — R2689 Other abnormalities of gait and mobility: Secondary | ICD-10-CM | POA: Diagnosis not present

## 2020-12-22 DIAGNOSIS — G20 Parkinson's disease: Secondary | ICD-10-CM | POA: Diagnosis not present

## 2020-12-22 DIAGNOSIS — R260 Ataxic gait: Secondary | ICD-10-CM | POA: Diagnosis not present

## 2020-12-26 DIAGNOSIS — R2689 Other abnormalities of gait and mobility: Secondary | ICD-10-CM | POA: Diagnosis not present

## 2020-12-26 DIAGNOSIS — R260 Ataxic gait: Secondary | ICD-10-CM | POA: Diagnosis not present

## 2020-12-26 DIAGNOSIS — G20 Parkinson's disease: Secondary | ICD-10-CM | POA: Diagnosis not present

## 2020-12-26 DIAGNOSIS — R2681 Unsteadiness on feet: Secondary | ICD-10-CM | POA: Diagnosis not present

## 2020-12-26 DIAGNOSIS — M6281 Muscle weakness (generalized): Secondary | ICD-10-CM | POA: Diagnosis not present

## 2020-12-27 DIAGNOSIS — R260 Ataxic gait: Secondary | ICD-10-CM | POA: Diagnosis not present

## 2020-12-27 DIAGNOSIS — M6281 Muscle weakness (generalized): Secondary | ICD-10-CM | POA: Diagnosis not present

## 2020-12-27 DIAGNOSIS — R2689 Other abnormalities of gait and mobility: Secondary | ICD-10-CM | POA: Diagnosis not present

## 2020-12-27 DIAGNOSIS — R2681 Unsteadiness on feet: Secondary | ICD-10-CM | POA: Diagnosis not present

## 2020-12-27 DIAGNOSIS — G20 Parkinson's disease: Secondary | ICD-10-CM | POA: Diagnosis not present

## 2020-12-29 DIAGNOSIS — M6281 Muscle weakness (generalized): Secondary | ICD-10-CM | POA: Diagnosis not present

## 2020-12-29 DIAGNOSIS — R2689 Other abnormalities of gait and mobility: Secondary | ICD-10-CM | POA: Diagnosis not present

## 2020-12-29 DIAGNOSIS — R260 Ataxic gait: Secondary | ICD-10-CM | POA: Diagnosis not present

## 2020-12-29 DIAGNOSIS — G20 Parkinson's disease: Secondary | ICD-10-CM | POA: Diagnosis not present

## 2020-12-29 DIAGNOSIS — R2681 Unsteadiness on feet: Secondary | ICD-10-CM | POA: Diagnosis not present

## 2021-01-01 ENCOUNTER — Other Ambulatory Visit: Payer: Self-pay | Admitting: Cardiovascular Disease

## 2021-01-01 ENCOUNTER — Other Ambulatory Visit: Payer: Self-pay | Admitting: Internal Medicine

## 2021-01-02 DIAGNOSIS — R260 Ataxic gait: Secondary | ICD-10-CM | POA: Diagnosis not present

## 2021-01-02 DIAGNOSIS — G20 Parkinson's disease: Secondary | ICD-10-CM | POA: Diagnosis not present

## 2021-01-02 DIAGNOSIS — M6281 Muscle weakness (generalized): Secondary | ICD-10-CM | POA: Diagnosis not present

## 2021-01-02 DIAGNOSIS — R2689 Other abnormalities of gait and mobility: Secondary | ICD-10-CM | POA: Diagnosis not present

## 2021-01-02 DIAGNOSIS — R2681 Unsteadiness on feet: Secondary | ICD-10-CM | POA: Diagnosis not present

## 2021-01-05 DIAGNOSIS — R2681 Unsteadiness on feet: Secondary | ICD-10-CM | POA: Diagnosis not present

## 2021-01-05 DIAGNOSIS — R2689 Other abnormalities of gait and mobility: Secondary | ICD-10-CM | POA: Diagnosis not present

## 2021-01-05 DIAGNOSIS — M6281 Muscle weakness (generalized): Secondary | ICD-10-CM | POA: Diagnosis not present

## 2021-01-05 DIAGNOSIS — G20 Parkinson's disease: Secondary | ICD-10-CM | POA: Diagnosis not present

## 2021-01-05 DIAGNOSIS — R260 Ataxic gait: Secondary | ICD-10-CM | POA: Diagnosis not present

## 2021-01-07 ENCOUNTER — Other Ambulatory Visit: Payer: Self-pay | Admitting: Internal Medicine

## 2021-01-08 ENCOUNTER — Other Ambulatory Visit: Payer: Self-pay | Admitting: Internal Medicine

## 2021-01-09 DIAGNOSIS — Z85828 Personal history of other malignant neoplasm of skin: Secondary | ICD-10-CM | POA: Diagnosis not present

## 2021-01-09 DIAGNOSIS — D044 Carcinoma in situ of skin of scalp and neck: Secondary | ICD-10-CM | POA: Diagnosis not present

## 2021-01-09 DIAGNOSIS — D485 Neoplasm of uncertain behavior of skin: Secondary | ICD-10-CM | POA: Diagnosis not present

## 2021-01-10 DIAGNOSIS — G20 Parkinson's disease: Secondary | ICD-10-CM | POA: Diagnosis not present

## 2021-01-10 DIAGNOSIS — R2681 Unsteadiness on feet: Secondary | ICD-10-CM | POA: Diagnosis not present

## 2021-01-10 DIAGNOSIS — R2689 Other abnormalities of gait and mobility: Secondary | ICD-10-CM | POA: Diagnosis not present

## 2021-01-10 DIAGNOSIS — M6281 Muscle weakness (generalized): Secondary | ICD-10-CM | POA: Diagnosis not present

## 2021-01-10 DIAGNOSIS — R260 Ataxic gait: Secondary | ICD-10-CM | POA: Diagnosis not present

## 2021-01-11 DIAGNOSIS — N401 Enlarged prostate with lower urinary tract symptoms: Secondary | ICD-10-CM | POA: Diagnosis not present

## 2021-01-11 DIAGNOSIS — N3946 Mixed incontinence: Secondary | ICD-10-CM | POA: Diagnosis not present

## 2021-01-11 DIAGNOSIS — R3915 Urgency of urination: Secondary | ICD-10-CM | POA: Diagnosis not present

## 2021-01-11 DIAGNOSIS — G20 Parkinson's disease: Secondary | ICD-10-CM | POA: Diagnosis not present

## 2021-01-11 DIAGNOSIS — R81 Glycosuria: Secondary | ICD-10-CM | POA: Diagnosis not present

## 2021-01-12 ENCOUNTER — Ambulatory Visit (INDEPENDENT_AMBULATORY_CARE_PROVIDER_SITE_OTHER): Payer: Medicare Other

## 2021-01-12 DIAGNOSIS — R2689 Other abnormalities of gait and mobility: Secondary | ICD-10-CM | POA: Diagnosis not present

## 2021-01-12 DIAGNOSIS — G20 Parkinson's disease: Secondary | ICD-10-CM | POA: Diagnosis not present

## 2021-01-12 DIAGNOSIS — R2681 Unsteadiness on feet: Secondary | ICD-10-CM | POA: Diagnosis not present

## 2021-01-12 DIAGNOSIS — M6281 Muscle weakness (generalized): Secondary | ICD-10-CM | POA: Diagnosis not present

## 2021-01-12 DIAGNOSIS — I495 Sick sinus syndrome: Secondary | ICD-10-CM | POA: Diagnosis not present

## 2021-01-12 DIAGNOSIS — R260 Ataxic gait: Secondary | ICD-10-CM | POA: Diagnosis not present

## 2021-01-15 LAB — CUP PACEART REMOTE DEVICE CHECK
Battery Remaining Longevity: 78 mo
Battery Remaining Percentage: 75 %
Brady Statistic RA Percent Paced: 32 %
Brady Statistic RV Percent Paced: 2 %
Date Time Interrogation Session: 20221021102900
Implantable Lead Implant Date: 20151013
Implantable Lead Implant Date: 20151013
Implantable Lead Location: 753859
Implantable Lead Location: 753860
Implantable Lead Model: 4135
Implantable Lead Model: 4136
Implantable Lead Serial Number: 29480373
Implantable Lead Serial Number: 29608302
Implantable Pulse Generator Implant Date: 20151013
Lead Channel Impedance Value: 503 Ohm
Lead Channel Impedance Value: 558 Ohm
Lead Channel Setting Pacing Amplitude: 2 V
Lead Channel Setting Pacing Amplitude: 4 V
Lead Channel Setting Pacing Pulse Width: 1 ms
Lead Channel Setting Sensing Sensitivity: 4 mV
Pulse Gen Serial Number: 702951

## 2021-01-15 NOTE — Progress Notes (Deleted)
Assessment/Plan:   1.  Parkinsons Disease  -continue carbidopa/levodopa 25/100, 2 tablets 3 times per day  -Has some dyskinesia, but not that bothersome and does not require medication.  -met with my lcsw today  2.  Diabetic peripheral neuropathy  -On gabapentin, 300 mg bid  3.  Depression  -On sertraline, 200 mg daily   Subjective:   Stephen Robbins was seen today in follow up for Parkinsons disease.  My previous records were reviewed prior to todays visit as well as outside records available to me. Pt was in the emergency room in August with complaints of weakness.  CT brain was performed and was nonacute.  Emergency room thought that patient's weakness was a result of his Parkinson's progression.  Patient was offered an appointment the following day, but declined that, stating that he was too tired after his emergency room visit.  He did go to [name] following this.   ALLERGIES:   Allergies  Allergen Reactions   Covid-19 Mrna Vacc (Moderna)     "Couldn't move". No rash, no itching    Penicillins Other (See Comments)    Unknown childhood reaction    CURRENT MEDICATIONS:  Outpatient Encounter Medications as of 01/16/2021  Medication Sig   ACCU-CHEK FASTCLIX LANCETS MISC Check blood sugar three times daily (Patient not taking: Reported on 11/15/2020)   bisacodyl (DULCOLAX) 5 MG EC tablet Take 5 mg daily as needed by mouth for moderate constipation. (Patient not taking: No sig reported)   Blood Glucose Monitoring Suppl (ACCU-CHEK GUIDE ME) w/Device KIT 1 Device by Does not apply route as directed. (Patient not taking: Reported on 11/15/2020)   Blood Pressure KIT Take blood pressure twice weekly. (Patient not taking: Reported on 11/15/2020)   buPROPion (WELLBUTRIN) 75 MG tablet Take 1 tablet (75 mg total) by mouth 2 (two) times daily.   carbidopa-levodopa (SINEMET IR) 25-100 MG tablet TAKE 2 TABLETS BY MOUTH THREE TIMES DAILY   Continuous Blood Gluc Receiver (FREESTYLE  LIBRE 14 DAY READER) DEVI Check blood sugars daily as directed (Patient not taking: Reported on 11/15/2020)   Continuous Blood Gluc Sensor (FREESTYLE LIBRE 14 DAY SENSOR) MISC Check blood sugars once daily as directed (Patient not taking: Reported on 11/15/2020)   FARXIGA 10 MG TABS tablet TAKE 1 TABLET(10 MG) BY MOUTH DAILY   gabapentin (NEURONTIN) 300 MG capsule TAKE 1 CAPSULE(300 MG) BY MOUTH TWICE DAILY   GEMTESA 75 MG TABS Take 1 tablet by mouth daily.   glucose blood (ACCU-CHEK GUIDE) test strip Check blood sugar three times daily (Patient not taking: Reported on 11/15/2020)   levothyroxine (SYNTHROID) 75 MCG tablet TAKE 1 TABLET(75 MCG) BY MOUTH DAILY BEFORE AND BREAKFAST   metFORMIN (GLUCOPHAGE) 1000 MG tablet TAKE 1 TABLET(1000 MG) BY MOUTH TWICE DAILY WITH A MEAL   polyethylene glycol powder (MIRALAX) powder Take 17 g daily by mouth.   sertraline (ZOLOFT) 100 MG tablet Take 1 tablet (100 mg total) by mouth daily.   simvastatin (ZOCOR) 40 MG tablet Take 1 tablet (40 mg total) by mouth at bedtime.   tamsulosin (FLOMAX) 0.4 MG CAPS capsule Take 0.4 mg by mouth daily.   vitamin B-12 (CYANOCOBALAMIN) 1000 MCG tablet Take 1,000 mcg by mouth every morning.    VITAMIN D, ERGOCALCIFEROL, PO Take by mouth.   XARELTO 20 MG TABS tablet TAKE 1 TABLET(20 MG) BY MOUTH DAILY   No facility-administered encounter medications on file as of 01/16/2021.    Objective:   PHYSICAL EXAMINATION:  VITALS:   There were no vitals filed for this visit.  Wt Readings from Last 3 Encounters:  11/20/20 150 lb (68 kg)  11/15/20 151 lb 2 oz (68.5 kg)  10/17/20 155 lb 4 oz (70.4 kg)     GEN:  The patient appears stated age and is in NAD. HEENT:  Normocephalic, atraumatic.  The mucous membranes are moist. The superficial temporal arteries are without ropiness or tenderness. CV:  RRR Lungs:  CTAB Neck/HEME:  There are no carotid bruits bilaterally.  Neurological examination:  Orientation: The patient is  alert and oriented x3. Cranial nerves: There is good facial symmetry with facial hypomimia. The speech is fluent and clear but hypophonic. Soft palate rises symmetrically and there is no tongue deviation. Hearing is intact to conversational tone. Sensation: Sensation is intact to light touch throughout Motor: Strength is at least antigravity x4.  Movement examination: Tone: There is nl tone in the UE/LE Abnormal movements: there is LUE rest tremor. Coordination:  There is no decremation with RAM's Gait and Station: The patient has no difficulty arising out of a deep-seated chair without the use of the hands. The patient's stride length is good with the walker.    I have reviewed and interpreted the following labs independently    Chemistry      Component Value Date/Time   NA 137 11/20/2020 1956   K 3.6 11/20/2020 1956   CL 103 11/20/2020 1956   CO2 26 11/20/2020 1956   BUN 16 11/20/2020 1956   CREATININE 0.73 11/20/2020 1956   CREATININE 0.93 12/07/2019 1520      Component Value Date/Time   CALCIUM 9.4 11/20/2020 1956   ALKPHOS 48 10/17/2020 1456   AST 18 10/17/2020 1456   ALT 10 10/17/2020 1456   BILITOT 0.6 10/17/2020 1456       Lab Results  Component Value Date   WBC 6.3 11/20/2020   HGB 14.6 11/20/2020   HCT 41.0 11/20/2020   MCV 96.5 11/20/2020   PLT 208 11/20/2020    Lab Results  Component Value Date   TSH 3.879 11/21/2020     Total time spent on today's visit was *** minutes, including both face-to-face time and nonface-to-face time.  Time included that spent on review of records (prior notes available to me/labs/imaging if pertinent), discussing treatment and goals, answering patient's questions and coordinating care.  Cc:  Colon Branch, MD

## 2021-01-16 ENCOUNTER — Ambulatory Visit: Payer: Medicare Other | Admitting: Neurology

## 2021-01-17 ENCOUNTER — Telehealth: Payer: Self-pay | Admitting: Neurology

## 2021-01-17 DIAGNOSIS — G20 Parkinson's disease: Secondary | ICD-10-CM | POA: Diagnosis not present

## 2021-01-17 DIAGNOSIS — R2689 Other abnormalities of gait and mobility: Secondary | ICD-10-CM | POA: Diagnosis not present

## 2021-01-17 DIAGNOSIS — M6281 Muscle weakness (generalized): Secondary | ICD-10-CM | POA: Diagnosis not present

## 2021-01-17 DIAGNOSIS — R260 Ataxic gait: Secondary | ICD-10-CM | POA: Diagnosis not present

## 2021-01-17 DIAGNOSIS — R2681 Unsteadiness on feet: Secondary | ICD-10-CM | POA: Diagnosis not present

## 2021-01-17 NOTE — Telephone Encounter (Deleted)
Called patient and left her voicemail. She is at work and told her to call me in the morning to discuss further

## 2021-01-17 NOTE — Telephone Encounter (Signed)
Pt called, he just got out of health care last week. His appt is 02/27/21. He wants to know if tat will fit him in sooner than that.

## 2021-01-17 NOTE — Telephone Encounter (Signed)
Called and LVM about no appts available right now until December

## 2021-01-19 DIAGNOSIS — H25011 Cortical age-related cataract, right eye: Secondary | ICD-10-CM | POA: Diagnosis not present

## 2021-01-19 DIAGNOSIS — Z961 Presence of intraocular lens: Secondary | ICD-10-CM | POA: Diagnosis not present

## 2021-01-19 DIAGNOSIS — R2681 Unsteadiness on feet: Secondary | ICD-10-CM | POA: Diagnosis not present

## 2021-01-19 DIAGNOSIS — R260 Ataxic gait: Secondary | ICD-10-CM | POA: Diagnosis not present

## 2021-01-19 DIAGNOSIS — H5703 Miosis: Secondary | ICD-10-CM | POA: Diagnosis not present

## 2021-01-19 DIAGNOSIS — M6281 Muscle weakness (generalized): Secondary | ICD-10-CM | POA: Diagnosis not present

## 2021-01-19 DIAGNOSIS — R2689 Other abnormalities of gait and mobility: Secondary | ICD-10-CM | POA: Diagnosis not present

## 2021-01-19 DIAGNOSIS — G20 Parkinson's disease: Secondary | ICD-10-CM | POA: Diagnosis not present

## 2021-01-19 DIAGNOSIS — H04123 Dry eye syndrome of bilateral lacrimal glands: Secondary | ICD-10-CM | POA: Diagnosis not present

## 2021-01-19 DIAGNOSIS — H2511 Age-related nuclear cataract, right eye: Secondary | ICD-10-CM | POA: Diagnosis not present

## 2021-01-19 DIAGNOSIS — H18523 Epithelial (juvenile) corneal dystrophy, bilateral: Secondary | ICD-10-CM | POA: Diagnosis not present

## 2021-01-19 DIAGNOSIS — E119 Type 2 diabetes mellitus without complications: Secondary | ICD-10-CM | POA: Diagnosis not present

## 2021-01-19 NOTE — Progress Notes (Signed)
Remote pacemaker transmission.   

## 2021-01-24 DIAGNOSIS — R2681 Unsteadiness on feet: Secondary | ICD-10-CM | POA: Diagnosis not present

## 2021-01-24 DIAGNOSIS — R2689 Other abnormalities of gait and mobility: Secondary | ICD-10-CM | POA: Diagnosis not present

## 2021-01-24 DIAGNOSIS — R260 Ataxic gait: Secondary | ICD-10-CM | POA: Diagnosis not present

## 2021-01-24 DIAGNOSIS — G20 Parkinson's disease: Secondary | ICD-10-CM | POA: Diagnosis not present

## 2021-01-24 DIAGNOSIS — M6281 Muscle weakness (generalized): Secondary | ICD-10-CM | POA: Diagnosis not present

## 2021-01-26 DIAGNOSIS — R2681 Unsteadiness on feet: Secondary | ICD-10-CM | POA: Diagnosis not present

## 2021-01-26 DIAGNOSIS — R2689 Other abnormalities of gait and mobility: Secondary | ICD-10-CM | POA: Diagnosis not present

## 2021-01-26 DIAGNOSIS — R260 Ataxic gait: Secondary | ICD-10-CM | POA: Diagnosis not present

## 2021-01-26 DIAGNOSIS — G20 Parkinson's disease: Secondary | ICD-10-CM | POA: Diagnosis not present

## 2021-01-26 DIAGNOSIS — M6281 Muscle weakness (generalized): Secondary | ICD-10-CM | POA: Diagnosis not present

## 2021-01-31 DIAGNOSIS — R2681 Unsteadiness on feet: Secondary | ICD-10-CM | POA: Diagnosis not present

## 2021-01-31 DIAGNOSIS — G20 Parkinson's disease: Secondary | ICD-10-CM | POA: Diagnosis not present

## 2021-01-31 DIAGNOSIS — R2689 Other abnormalities of gait and mobility: Secondary | ICD-10-CM | POA: Diagnosis not present

## 2021-01-31 DIAGNOSIS — R260 Ataxic gait: Secondary | ICD-10-CM | POA: Diagnosis not present

## 2021-01-31 DIAGNOSIS — M6281 Muscle weakness (generalized): Secondary | ICD-10-CM | POA: Diagnosis not present

## 2021-02-02 DIAGNOSIS — R260 Ataxic gait: Secondary | ICD-10-CM | POA: Diagnosis not present

## 2021-02-02 DIAGNOSIS — R2681 Unsteadiness on feet: Secondary | ICD-10-CM | POA: Diagnosis not present

## 2021-02-02 DIAGNOSIS — M6281 Muscle weakness (generalized): Secondary | ICD-10-CM | POA: Diagnosis not present

## 2021-02-02 DIAGNOSIS — R2689 Other abnormalities of gait and mobility: Secondary | ICD-10-CM | POA: Diagnosis not present

## 2021-02-02 DIAGNOSIS — G20 Parkinson's disease: Secondary | ICD-10-CM | POA: Diagnosis not present

## 2021-02-05 DIAGNOSIS — R260 Ataxic gait: Secondary | ICD-10-CM | POA: Diagnosis not present

## 2021-02-05 DIAGNOSIS — R2681 Unsteadiness on feet: Secondary | ICD-10-CM | POA: Diagnosis not present

## 2021-02-05 DIAGNOSIS — G20 Parkinson's disease: Secondary | ICD-10-CM | POA: Diagnosis not present

## 2021-02-05 DIAGNOSIS — M6281 Muscle weakness (generalized): Secondary | ICD-10-CM | POA: Diagnosis not present

## 2021-02-05 DIAGNOSIS — R2689 Other abnormalities of gait and mobility: Secondary | ICD-10-CM | POA: Diagnosis not present

## 2021-02-07 DIAGNOSIS — M6281 Muscle weakness (generalized): Secondary | ICD-10-CM | POA: Diagnosis not present

## 2021-02-07 DIAGNOSIS — R2689 Other abnormalities of gait and mobility: Secondary | ICD-10-CM | POA: Diagnosis not present

## 2021-02-07 DIAGNOSIS — R2681 Unsteadiness on feet: Secondary | ICD-10-CM | POA: Diagnosis not present

## 2021-02-07 DIAGNOSIS — G20 Parkinson's disease: Secondary | ICD-10-CM | POA: Diagnosis not present

## 2021-02-07 DIAGNOSIS — R260 Ataxic gait: Secondary | ICD-10-CM | POA: Diagnosis not present

## 2021-02-08 DIAGNOSIS — H04123 Dry eye syndrome of bilateral lacrimal glands: Secondary | ICD-10-CM | POA: Diagnosis not present

## 2021-02-08 DIAGNOSIS — H18523 Epithelial (juvenile) corneal dystrophy, bilateral: Secondary | ICD-10-CM | POA: Diagnosis not present

## 2021-02-08 DIAGNOSIS — G20 Parkinson's disease: Secondary | ICD-10-CM | POA: Diagnosis not present

## 2021-02-12 DIAGNOSIS — R2681 Unsteadiness on feet: Secondary | ICD-10-CM | POA: Diagnosis not present

## 2021-02-12 DIAGNOSIS — R2689 Other abnormalities of gait and mobility: Secondary | ICD-10-CM | POA: Diagnosis not present

## 2021-02-12 DIAGNOSIS — R260 Ataxic gait: Secondary | ICD-10-CM | POA: Diagnosis not present

## 2021-02-12 DIAGNOSIS — G20 Parkinson's disease: Secondary | ICD-10-CM | POA: Diagnosis not present

## 2021-02-12 DIAGNOSIS — M6281 Muscle weakness (generalized): Secondary | ICD-10-CM | POA: Diagnosis not present

## 2021-02-14 DIAGNOSIS — R260 Ataxic gait: Secondary | ICD-10-CM | POA: Diagnosis not present

## 2021-02-14 DIAGNOSIS — R2681 Unsteadiness on feet: Secondary | ICD-10-CM | POA: Diagnosis not present

## 2021-02-14 DIAGNOSIS — R2689 Other abnormalities of gait and mobility: Secondary | ICD-10-CM | POA: Diagnosis not present

## 2021-02-14 DIAGNOSIS — M6281 Muscle weakness (generalized): Secondary | ICD-10-CM | POA: Diagnosis not present

## 2021-02-14 DIAGNOSIS — G20 Parkinson's disease: Secondary | ICD-10-CM | POA: Diagnosis not present

## 2021-02-19 DIAGNOSIS — H25811 Combined forms of age-related cataract, right eye: Secondary | ICD-10-CM | POA: Diagnosis not present

## 2021-02-19 DIAGNOSIS — H25011 Cortical age-related cataract, right eye: Secondary | ICD-10-CM | POA: Diagnosis not present

## 2021-02-19 DIAGNOSIS — H2511 Age-related nuclear cataract, right eye: Secondary | ICD-10-CM | POA: Diagnosis not present

## 2021-02-19 DIAGNOSIS — H5703 Miosis: Secondary | ICD-10-CM | POA: Diagnosis not present

## 2021-02-20 ENCOUNTER — Encounter: Payer: Self-pay | Admitting: Internal Medicine

## 2021-02-20 ENCOUNTER — Ambulatory Visit: Payer: Medicare Other | Admitting: Internal Medicine

## 2021-02-26 DIAGNOSIS — G20 Parkinson's disease: Secondary | ICD-10-CM | POA: Diagnosis not present

## 2021-02-26 DIAGNOSIS — R2681 Unsteadiness on feet: Secondary | ICD-10-CM | POA: Diagnosis not present

## 2021-02-26 DIAGNOSIS — R2689 Other abnormalities of gait and mobility: Secondary | ICD-10-CM | POA: Diagnosis not present

## 2021-02-26 DIAGNOSIS — R260 Ataxic gait: Secondary | ICD-10-CM | POA: Diagnosis not present

## 2021-02-26 DIAGNOSIS — M6281 Muscle weakness (generalized): Secondary | ICD-10-CM | POA: Diagnosis not present

## 2021-02-26 NOTE — Progress Notes (Signed)
Assessment/Plan:   1.  Parkinsons Disease  -continue carbidopa/levodopa 25/100, 2 tablets 3 times per day.  Currently taking it with protein.  Discussed interaction with protein.  -Has some dyskinesia, but not that bothersome and does not require medication.  He and I discussed nature of dyskinesia today.  Patient does not even notice the dyskinesia.  -Patient is starting physical therapy at [facility].   2.  Diabetic peripheral neuropathy  -On gabapentin, 300 mg bid  3.  Depression  -On sertraline, 100 mg daily.  Used to be on 100 mg bid.  Unclear why changed  -pcp managing  -Patient continues to have very significant depression associated with the loss of his wife.  He has to meet with our LCSW today, which he did.  4.  Weight loss  -discussed protein and while I want him to avoid protein with levodopa, I still want him to take in protein.  We discussed boost, Ensure, halo top and other good sources of protein.  5.  Visual hallucinations  -This really only happens with his wife, and I suspect that this is depression related psychosis as opposed to Parkinson's related psychosis.   -The caregiver states that she has only been back with him this week, and there was another caregiver there previously.  That company just came back into the patient's care.  I asked her to watch this, and if she notes consistent hallucinations (not just associated with the wife) to call me and we can potentially start Nuplazid.  Subjective:   Stephen Robbins was seen today in follow up for Parkinsons disease.  My previous records were reviewed prior to todays visit as well as outside records available to me. Pt with caregiver who supplements hx.   I last saw the patient in May.  I have tried to get the patient in sooner for appointment, but unfortunately he no showed the appointment with me at the end of October (and declined another before that because he had just gotten home from rehab).  He was in the  emergency room at the end of August with generalized weakness.  Work-up was negative.  He ended up going to rehab at [facility] after that just for rehab but Dr. [name] has suggessted assisted living at [facility].  On sertraline.  Used to be on 100 mg bid - now on 100 q day - unclear why change.   States that some of falls happened after covid booster because it made him very weak.  Patient admits to what he calls hallucinations, but when he describes them very vivid dreams at night.  He states that he does not have them during the day.  It was not until after the visit was over that his caregiver came out to the hallway and stated that patient is seeing his wife in another room.  She is not aware of anything else he may see.  Current movement disorder medications: Carbidopa/levodopa 25/100, 2 tablets 3 times per day (states taking breakfast, lunch dinner) Gabapentin, 300 mg twice per day Sertraline, 100 mg daily   ALLERGIES:   Allergies  Allergen Reactions   Covid-19 Mrna Vacc (Moderna)     "Couldn't move". No rash, no itching    Penicillins Other (See Comments)    Unknown childhood reaction    CURRENT MEDICATIONS:  Outpatient Encounter Medications as of 02/27/2021  Medication Sig   ACCU-CHEK FASTCLIX LANCETS MISC Check blood sugar three times daily (Patient not taking: Reported on 11/15/2020)  bisacodyl (DULCOLAX) 5 MG EC tablet Take 5 mg daily as needed by mouth for moderate constipation. (Patient not taking: No sig reported)   Blood Glucose Monitoring Suppl (ACCU-CHEK GUIDE ME) w/Device KIT 1 Device by Does not apply route as directed. (Patient not taking: Reported on 11/15/2020)   Blood Pressure KIT Take blood pressure twice weekly. (Patient not taking: Reported on 11/15/2020)   buPROPion (WELLBUTRIN) 75 MG tablet Take 1 tablet (75 mg total) by mouth 2 (two) times daily.   carbidopa-levodopa (SINEMET IR) 25-100 MG tablet TAKE 2 TABLETS BY MOUTH THREE TIMES DAILY   Continuous Blood Gluc  Receiver (FREESTYLE LIBRE 14 DAY READER) DEVI Check blood sugars daily as directed (Patient not taking: Reported on 11/15/2020)   Continuous Blood Gluc Sensor (FREESTYLE LIBRE 14 DAY SENSOR) MISC Check blood sugars once daily as directed (Patient not taking: Reported on 11/15/2020)   FARXIGA 10 MG TABS tablet TAKE 1 TABLET(10 MG) BY MOUTH DAILY   gabapentin (NEURONTIN) 300 MG capsule TAKE 1 CAPSULE(300 MG) BY MOUTH TWICE DAILY   GEMTESA 75 MG TABS Take 1 tablet by mouth daily.   glucose blood (ACCU-CHEK GUIDE) test strip Check blood sugar three times daily (Patient not taking: Reported on 11/15/2020)   levothyroxine (SYNTHROID) 75 MCG tablet TAKE 1 TABLET(75 MCG) BY MOUTH DAILY BEFORE AND BREAKFAST   metFORMIN (GLUCOPHAGE) 1000 MG tablet TAKE 1 TABLET(1000 MG) BY MOUTH TWICE DAILY WITH A MEAL   polyethylene glycol powder (MIRALAX) powder Take 17 g daily by mouth.   sertraline (ZOLOFT) 100 MG tablet Take 1 tablet (100 mg total) by mouth daily.   simvastatin (ZOCOR) 40 MG tablet Take 1 tablet (40 mg total) by mouth at bedtime.   tamsulosin (FLOMAX) 0.4 MG CAPS capsule Take 0.4 mg by mouth daily.   vitamin B-12 (CYANOCOBALAMIN) 1000 MCG tablet Take 1,000 mcg by mouth every morning.    VITAMIN D, ERGOCALCIFEROL, PO Take by mouth.   XARELTO 20 MG TABS tablet TAKE 1 TABLET(20 MG) BY MOUTH DAILY   No facility-administered encounter medications on file as of 02/27/2021.    Objective:   PHYSICAL EXAMINATION:    VITALS:   Vitals:   02/27/21 1452  BP: (!) 112/56  Pulse: 73  SpO2: 96%  Weight: 148 lb (67.1 kg)  Height: 5' 6" (1.676 m)    Wt Readings from Last 3 Encounters:  02/27/21 148 lb (67.1 kg)  11/20/20 150 lb (68 kg)  11/15/20 151 lb 2 oz (68.5 kg)     GEN:  The patient appears stated age and is in NAD.  He appears frail HEENT:  Normocephalic, atraumatic.  Right eye is just slightly irritated, as he just had cataract surgery.  The mucous membranes are moist. The superficial temporal  arteries are without ropiness or tenderness. CV:  RRR Lungs:  CTAB Neck/HEME:  There are no carotid bruits bilaterally.  Neurological examination:  Orientation: The patient is alert and oriented x3.  He asks appropriate questions (remembers examiner's daughters and asks about them). Cranial nerves: There is good facial symmetry with facial hypomimia. The speech is fluent and clear but hypophonic. Soft palate rises symmetrically and there is no tongue deviation. Hearing is intact to conversational tone. Sensation: Sensation is intact to light touch throughout Motor: Strength is at least antigravity x4.  Movement examination: Tone: There is nl tone in the UE/LE Abnormal movements: there is no tremor today, but he does have right arm/shoulder dyskinesia Coordination:  There is no decremation with RAM's Gait and  Station: pt is short stepped and shuffling without the walker.  He is better with the walker.  I have reviewed and interpreted the following labs independently    Chemistry      Component Value Date/Time   NA 137 11/20/2020 1956   K 3.6 11/20/2020 1956   CL 103 11/20/2020 1956   CO2 26 11/20/2020 1956   BUN 16 11/20/2020 1956   CREATININE 0.73 11/20/2020 1956   CREATININE 0.93 12/07/2019 1520      Component Value Date/Time   CALCIUM 9.4 11/20/2020 1956   ALKPHOS 48 10/17/2020 1456   AST 18 10/17/2020 1456   ALT 10 10/17/2020 1456   BILITOT 0.6 10/17/2020 1456       Lab Results  Component Value Date   WBC 6.3 11/20/2020   HGB 14.6 11/20/2020   HCT 41.0 11/20/2020   MCV 96.5 11/20/2020   PLT 208 11/20/2020    Lab Results  Component Value Date   TSH 3.879 11/21/2020     Total time spent on today's visit was 30 minutes, including both face-to-face time and nonface-to-face time.  Time included that spent on review of records (prior notes available to me/labs/imaging if pertinent), discussing treatment and goals, answering patient's questions and coordinating  care.  Cc:  Colon Branch, MD

## 2021-02-27 ENCOUNTER — Ambulatory Visit (INDEPENDENT_AMBULATORY_CARE_PROVIDER_SITE_OTHER): Payer: Medicare Other | Admitting: Neurology

## 2021-02-27 ENCOUNTER — Other Ambulatory Visit: Payer: Self-pay

## 2021-02-27 VITALS — BP 112/56 | HR 73 | Ht 66.0 in | Wt 148.0 lb

## 2021-02-27 DIAGNOSIS — F331 Major depressive disorder, recurrent, moderate: Secondary | ICD-10-CM | POA: Diagnosis not present

## 2021-02-27 DIAGNOSIS — G20 Parkinson's disease: Secondary | ICD-10-CM | POA: Diagnosis not present

## 2021-02-27 DIAGNOSIS — R441 Visual hallucinations: Secondary | ICD-10-CM

## 2021-02-27 MED ORDER — CARBIDOPA-LEVODOPA 25-100 MG PO TABS: 2.0000 | ORAL_TABLET | Freq: Three times a day (TID) | ORAL | 1 refills | Status: DC

## 2021-02-27 NOTE — Patient Instructions (Signed)
Take carbidopa/levodopa 25/100 at 9am/1pm/5pm    As a reminder, carbidopa/levodopa can be taken at the same time as a carbohydrate, but we like to have you take your pill either 30 minutes before a protein source or 1 hour after as protein can interfere with carbidopa/levodopa absorption.  I do want you to take protein but not at same time as levodopa.  Ensure/boost is a good source but try Halo Top ice cream as well.  Its high protein and delicious!  Online Resources for Power over Parkinson's Group November 2022  Local [place] Online Groups  Power over [employer] Group :   Power Over Parkinson's Patient Education Group will be Wednesday, November 9th-*Hybrid meting*- in person at [place] location and via [facility] at 2:00 pm.   Upcoming Power over Parkinson's Meetings:  2nd Wednesdays of the month at 2 pm:  November 9th, December 14th [place] at [email or web address] if interested in participating in this online group Parkinson's Care Partners Group:    3rd Mondays, Contact Misty Paladino Atypical Parkinsonian Patient Group:   4th Wednesdays, [place] If you are interested in participating in these online groups with Misty, please contact her directly for how to join those meetings.  Her contact information is [email or web address].   [place]:  [email or web address] PD Health at Home continues:  Mindfulness Mondays, Expert Briefing Tuesdays, Wellness Wednesdays, Take Time Thursdays, Fitness Fridays -Listings for June 2022 are on the website Upcoming Webinar:  Expert Briefing:  Let's Talk about Dementia.  Wednesday, November 2nd  at 1 pm. Register for Armed forces operational officer) at [email or web address]  Please check out their website to sign up for emails and see their full online offerings  [place]:  [email or web address]  Upcoming Webinar:    2022 in Review:  Progress Toward Better Treatment and Prevention.  Thursday, November 17th at 12 noon Check out additional information on their website to see their full online offerings  [place]:  [email or web address] Upcoming Webinar:  NOH (Neurogenic Orthostatic Hypotension):  What it is, How to Know if you Have it, and How to Manage it.  Tuesday, November 15th at 3 pm.  Webinar Series:  Living with Parkinson's Meetup.   Third Thursdays of each month at 3 pm; next one is November 17th Care Partner Monthly Meetup.  With Robin Searing Phinney.  First Tuesday of each month, 2 pm Joy Breaks:  First Wednesday of each month, 2-3 pm. There will be art, doodling, making, crafting, listening, laughing, stories, and everything in between. No art experience necessary. No supplies required. Just show up for joy!  Register on their website. Check out additional information to Live Well Today on their website  Parkinson and Movement Disorders (PMD) Alliance:  www.pmdalliance.org NeuroLife Online:  Online Education Events Sign up for emails, which are sent weekly to give you updates on programming and online offerings  Parkinson's Association of the [place]:  www.[email or web address] Information on online support groups, education events, and online exercises including Yoga, Parkinson's exercises and more-LOTS of information on links to PD resources and online events Virtual Support Group through Parkinson's Association of the [place]; next one is scheduled for Wednesday, November 2nd  at 2 pm.  (These are typically scheduled for the 1st Wednesday of the month at 2 pm).  Visit website for details.  Additional links for movement activities: Parkinson's DRUMMING Classes/Music Therapy with Doylene Canning:  This is a returning class and it's FREE!  2nd Mondays, continuing November 14th.  Contact *Misty  Taylor-Paladino at [email or web address] or Doylene Canning at  [phone] or [email or web address]  PWR! Moves Classes at [place].  Wednesdays 10 and 11 am.  Contact Amy Marriott, PT [email or web address] if interested. Here is a link to the PWR!Moves classes on Zoom from [place] - Daily Mon-Sat at 10:00. Via Zoom, FREE and open to all.  There is also a link below via Facebook if you use that platform. [email or web address] Parkinson's Wellness Recovery (PWR! Moves)  www.[email or web address] Info on the PWR! Virtual Experience:  You will have access to our expertise through self-assessment, guided plans that start with the PD-specific fundamentals, educational content, tips, Q&A with an expert, and a growing Art therapist of PD-specific pre-recorded and live exercise classes of varying types and intensity - both physical and cognitive! If that is not enough, we offer 1:1 wellness consultations (in-person or virtual) to personalize your PWR! Research scientist (medical).  [place] Fridays:  As part of the PD Health @ Home program, this free video series focuses each week on one aspect of fitness designed to support people living with Parkinson's.  These weekly videos highlight the [place] recent fitness guidelines for people with Parkinson's disease.  HollywoodSale.dk Dance for PD website is offering free, live-stream classes throughout the week, as well as links to [employer] of classes:  [email or web address] Dance for Parkinson's Class:  [place].  Free offering for people with Parkinson's and care partners; virtual class.  For more information, contact [phone] or email Ruffin Frederick at  [email or web address] Virtual dance and Pilates for Parkinson's classes: Click on the Community Tab> Parkinson's Movement Initiative Tab.  To register for classes and for more information, visit www.[email or web address].co.za and click the "community" tab.  [employer] Parkinson's Cycling Classes  [employer]: 1pm on Fridays-Live classes at [employer] ([employer] at [email or web address] or [phone]) [employer]: Virtual Classes Mondays and Thursdays Jeanette Caprice classes Tuesday, Wednesday and Thursday (contact [place] at [email or web address]  or [phone]) [place] Varied levels of classes are offered Mondays, Tuesdays and Thursdays at [employer].  To observe a class or for more information, call [phone] or email [email or web address] Well[employer]: Online Caregiver Education Opportunities:  www.well-[email or web address]/caregiver-education/[email or web address]-support-group.  You may also contact Vickki Muff at [email or web address] or [phone].   *Multiple opportunities below, as November is Caregiver Month!* A Guide to Physical and Mental Fitness for Family Caregivers.  Wednesday, November 9th, 12:30-2 pm at [facility] of You!  Tuesday, November 15th, 11:30-12:45.  [facility], [place] 09-28-1991 Understanding Care Options and Advanced Care Planning.  Monday, November 21st, 4:30-5:45.  [place], [place] above to register. Well-Spring Navigator:  [address] program, a free service to help individuals and families through the journey of determining care for older adults.  The "Navigator" is a [employer], Education officer, museum, who will speak with a prospective client and/or loved ones to provide an assessment of the situation and a set of recommendations for a personalized care plan -- all free of charge, and whether Well-Spring  Solutions offers the needed service or not. If the need is not a service we provide, we are well-connected with reputable programs in town that we can refer you to.  www.well-springsolutions.org or to speak with the Navigator, call [phone].

## 2021-03-01 DIAGNOSIS — R2689 Other abnormalities of gait and mobility: Secondary | ICD-10-CM | POA: Diagnosis not present

## 2021-03-01 DIAGNOSIS — G20 Parkinson's disease: Secondary | ICD-10-CM | POA: Diagnosis not present

## 2021-03-01 DIAGNOSIS — R2681 Unsteadiness on feet: Secondary | ICD-10-CM | POA: Diagnosis not present

## 2021-03-01 DIAGNOSIS — R260 Ataxic gait: Secondary | ICD-10-CM | POA: Diagnosis not present

## 2021-03-01 DIAGNOSIS — M6281 Muscle weakness (generalized): Secondary | ICD-10-CM | POA: Diagnosis not present

## 2021-03-05 DIAGNOSIS — R2689 Other abnormalities of gait and mobility: Secondary | ICD-10-CM | POA: Diagnosis not present

## 2021-03-05 DIAGNOSIS — R260 Ataxic gait: Secondary | ICD-10-CM | POA: Diagnosis not present

## 2021-03-05 DIAGNOSIS — M6281 Muscle weakness (generalized): Secondary | ICD-10-CM | POA: Diagnosis not present

## 2021-03-05 DIAGNOSIS — G20 Parkinson's disease: Secondary | ICD-10-CM | POA: Diagnosis not present

## 2021-03-05 DIAGNOSIS — R2681 Unsteadiness on feet: Secondary | ICD-10-CM | POA: Diagnosis not present

## 2021-03-12 DIAGNOSIS — G20 Parkinson's disease: Secondary | ICD-10-CM | POA: Diagnosis not present

## 2021-03-12 DIAGNOSIS — R2681 Unsteadiness on feet: Secondary | ICD-10-CM | POA: Diagnosis not present

## 2021-03-12 DIAGNOSIS — R2689 Other abnormalities of gait and mobility: Secondary | ICD-10-CM | POA: Diagnosis not present

## 2021-03-12 DIAGNOSIS — M6281 Muscle weakness (generalized): Secondary | ICD-10-CM | POA: Diagnosis not present

## 2021-03-12 DIAGNOSIS — R260 Ataxic gait: Secondary | ICD-10-CM | POA: Diagnosis not present

## 2021-03-14 ENCOUNTER — Telehealth: Payer: Self-pay

## 2021-03-14 ENCOUNTER — Other Ambulatory Visit: Payer: Self-pay

## 2021-03-14 DIAGNOSIS — M6281 Muscle weakness (generalized): Secondary | ICD-10-CM | POA: Diagnosis not present

## 2021-03-14 DIAGNOSIS — R2681 Unsteadiness on feet: Secondary | ICD-10-CM | POA: Diagnosis not present

## 2021-03-14 DIAGNOSIS — R2689 Other abnormalities of gait and mobility: Secondary | ICD-10-CM | POA: Diagnosis not present

## 2021-03-14 DIAGNOSIS — R260 Ataxic gait: Secondary | ICD-10-CM | POA: Diagnosis not present

## 2021-03-14 DIAGNOSIS — G20 Parkinson's disease: Secondary | ICD-10-CM | POA: Diagnosis not present

## 2021-03-14 MED ORDER — BLOOD PRESSURE KIT: PACK | 0 refills | Status: DC

## 2021-03-14 MED ORDER — BLOOD GLUCOSE METER KIT: PACK | 0 refills | Status: DC

## 2021-03-14 NOTE — Telephone Encounter (Signed)
Received fax from [facility]- requesting glucometer and bp monitor for [name] sent to [facility] in [place]. Rx's sent.

## 2021-03-19 ENCOUNTER — Emergency Department (HOSPITAL_BASED_OUTPATIENT_CLINIC_OR_DEPARTMENT_OTHER): Payer: Medicare Other

## 2021-03-19 ENCOUNTER — Emergency Department (HOSPITAL_BASED_OUTPATIENT_CLINIC_OR_DEPARTMENT_OTHER)
Admission: EM | Admit: 2021-03-19 | Discharge: 2021-03-19 | Disposition: A | Discharge status: Home / Self Care | Payer: Medicare Other | Attending: Emergency Medicine | Admitting: Emergency Medicine

## 2021-03-19 ENCOUNTER — Encounter (HOSPITAL_BASED_OUTPATIENT_CLINIC_OR_DEPARTMENT_OTHER): Payer: Self-pay | Admitting: *Deleted

## 2021-03-19 ENCOUNTER — Other Ambulatory Visit: Payer: Self-pay

## 2021-03-19 DIAGNOSIS — S6992XA Unspecified injury of left wrist, hand and finger(s), initial encounter: Secondary | ICD-10-CM | POA: Diagnosis present

## 2021-03-19 DIAGNOSIS — Z7901 Long term (current) use of anticoagulants: Secondary | ICD-10-CM | POA: Insufficient documentation

## 2021-03-19 DIAGNOSIS — Z85828 Personal history of other malignant neoplasm of skin: Secondary | ICD-10-CM | POA: Insufficient documentation

## 2021-03-19 DIAGNOSIS — Z950 Presence of cardiac pacemaker: Secondary | ICD-10-CM | POA: Diagnosis not present

## 2021-03-19 DIAGNOSIS — Z7984 Long term (current) use of oral hypoglycemic drugs: Secondary | ICD-10-CM | POA: Insufficient documentation

## 2021-03-19 DIAGNOSIS — E119 Type 2 diabetes mellitus without complications: Secondary | ICD-10-CM | POA: Insufficient documentation

## 2021-03-19 DIAGNOSIS — Z4789 Encounter for other orthopedic aftercare: Secondary | ICD-10-CM | POA: Diagnosis not present

## 2021-03-19 DIAGNOSIS — Y92009 Unspecified place in unspecified non-institutional (private) residence as the place of occurrence of the external cause: Secondary | ICD-10-CM | POA: Diagnosis not present

## 2021-03-19 DIAGNOSIS — M19042 Primary osteoarthritis, left hand: Secondary | ICD-10-CM | POA: Diagnosis not present

## 2021-03-19 DIAGNOSIS — W01198A Fall on same level from slipping, tripping and stumbling with subsequent striking against other object, initial encounter: Secondary | ICD-10-CM | POA: Insufficient documentation

## 2021-03-19 DIAGNOSIS — S63295A Dislocation of distal interphalangeal joint of left ring finger, initial encounter: Secondary | ICD-10-CM

## 2021-03-19 DIAGNOSIS — Z87891 Personal history of nicotine dependence: Secondary | ICD-10-CM | POA: Diagnosis not present

## 2021-03-19 DIAGNOSIS — S63285A Dislocation of proximal interphalangeal joint of left ring finger, initial encounter: Secondary | ICD-10-CM | POA: Diagnosis not present

## 2021-03-19 DIAGNOSIS — G20 Parkinson's disease: Secondary | ICD-10-CM | POA: Insufficient documentation

## 2021-03-19 DIAGNOSIS — E039 Hypothyroidism, unspecified: Secondary | ICD-10-CM | POA: Diagnosis not present

## 2021-03-19 MED ORDER — LIDOCAINE HCL 2 % IJ SOLN
10.0000 mL | Freq: Once | INTRAMUSCULAR | Status: AC
  Administered 2021-03-19: 16:00:00 200 mg via INTRADERMAL
  Filled 2021-03-19: qty 20

## 2021-03-19 MED ORDER — LIDOCAINE HCL (PF) 1 % IJ SOLN
INTRAMUSCULAR | Status: AC
  Filled 2021-03-19: qty 5

## 2021-03-19 NOTE — ED Triage Notes (Signed)
Injury to his left ring finger today. Deformity noted.

## 2021-03-19 NOTE — ED Notes (Signed)
Pts pull up changed as well as skin care provided.

## 2021-03-19 NOTE — ED Notes (Addendum)
Became unbalanced in his office and fell, striking left hand on a box, has brusing/ discoloration of left 4th (ring) digit. Ice pack currently in place. States has normal sensation, capillary refill WNL

## 2021-03-19 NOTE — ED Provider Notes (Signed)
[facility] EMERGENCY DEPARTMENT Provider Note   CSN: [id number] Arrival date & time: 03/19/21  1317     History Chief Complaint  Patient presents with   Finger Injury    Stephen Robbins is a 85 y.o. male.  HPI Patient presents for left ring finger injury.  He had a mechanical fall at home and fell forward striking his left hand.  This resulted in acute onset of finger pain and deformity.  He has had decreased energy motion to this finger as well.  He had a friend who is a trained doctor attempt to reduce it at home.  This was unsuccessful.  This also caused significant pain to the patient.  He presents to the ED for further evaluation and management.  He denies any other areas of pain, discomfort, or suspected injury.    Past Medical History:  Diagnosis Date   Arthritis    "joints; a little" (01/04/2014)   Asymptomatic bilateral carotid artery stenosis    per duplex  05-15-2012  left >39%/   right 40-59%   Basal cell carcinoma    nose   Borderline diabetes    BPH (benign prostatic hypertrophy) with urinary obstruction    Bradycardia    Bronchial pneumonia 1958   Chronotropic incompetence with sinus node dysfunction (HCC)    Compressed cervical disc    Compression of lumbar vertebra (HCC)    L4 -- L5   Dizzy    Dysmetabolic syndrome    Fatigue    Frequency of urination    GERD (gastroesophageal reflux disease)    occasional   Hemorrhoids    Hyperlipidemia    Hypothyroidism    Nocturia    NSVT (nonsustained ventricular tachycardia)    cardiologist-  dr croitoru   Pacemaker    Urgency of urination    Wears glasses     Patient Active Problem List   Diagnosis Date Noted   Primary osteoarthritis of right shoulder 03/16/2019   Diabetes mellitus type 2 in nonobese ([place]) 10/05/2018   PCP NOTES >>>>>>>>. 02/08/2018   Annual physical exam 02/06/2018   Paroxysmal atrial fibrillation ([place]) 02/23/2015   Chronotropic incompetence with sinus node dysfunction  [facility])    Pacemaker - [employer] model L121 serial number [id number] 01/04/2014   SSS (sick sinus syndrome) ([place]) 12/20/2013   Chronotropic incompetence with autonomic dysfunction 12/20/2013   Diabetic peripheral neuropathy ([place]) 06/17/2013   Parkinson's disease ([place]) 06/17/2013   NSVT (nonsustained ventricular tachycardia) 12/18/2012   Bradycardia 11/26/2012   Dizziness, nonspecific 11/26/2012   CARPAL TUNNEL SYNDROME, BILATERAL 01/23/2010   HEARING LOSS 12/18/2009   Seasonal and perennial allergic rhinitis 03/26/2008   UNS ADVRS EFF UNS RX MEDICINAL&BIOLOGICAL SBSTNC 09/17/2007   HYPOGONADISM, MALE 04/23/2007   Anxiety and depression 02/10/2007   BENIGN PROSTATIC HYPERTROPHY, WITH OBSTRUCTION 02/10/2007   OSTEOARTHRITIS 02/10/2007   LOW BACK PAIN 02/10/2007   HYPERGLYCEMIA, BORDERLINE 02/10/2007   Hypothyroidism 10/14/2006   Dyslipidemia 09/18/2006    Past Surgical History:  Procedure Laterality Date   BASAL CELL CARCINOMA EXCISION     nose   CLOSED REDUCTION NASAL FRACTURE  09-01-2007   CYSTOSCOPY N/A 12/28/2012   Procedure: CYSTOSCOPY;  Surgeon: Bernestine Amass, MD;  Location: [facility];  Service: Urology;  Laterality: N/A;   EXERCISE TOLERENCE TEST  12-03-2012  DR CROITURO   CHRONOTROPIC INCOMPETENCE/ NORMAL RESTING BP W/ APPROPRIATE RESPONSE/ NO CHEST PAIN/ NO ST CHANGES FROM BASELINE   INSERT / REPLACE / REMOVE  PACEMAKER  01/04/2014   [employer] model L121 serial number [id number]   LACERATION REPAIR Right 1978   middle finger   NEUROPLASTY / TRANSPOSITION ULNAR NERVE AT ELBOW Left 02-09-2010   PERMANENT PACEMAKER INSERTION N/A 01/04/2014   Procedure: PERMANENT PACEMAKER INSERTION;  Surgeon: Sanda Klein, MD;  Location: [facility];  Service: Cardiovascular;  Laterality: N/A;   SKIN BIOPSY     scc   TONSILLECTOMY AND ADENOIDECTOMY  1944   TRANSURETHRAL INCISION OF PROSTATE N/A 12/28/2012   Procedure: TRANSURETHRAL INCISION OF  THE PROSTATE (TUIP);  Surgeon: Bernestine Amass, MD;  Location: [facility];  Service: Urology;  Laterality: N/A;   ULNAR NERVE TRANSPOSITION         Family History  Problem Relation Age of Onset   Stroke Father    Lung cancer Mother    Parkinson's disease Brother    Heart Problems Brother     Social History   Tobacco Use   Smoking status: Former    Packs/day: 1.00    Years: 10.00    Pack years: 10.00    Types: Cigarettes    Quit date: 03/25/1968    Years since quitting: 53.0   Smokeless tobacco: Never  Vaping Use   Vaping Use: Never used  Substance Use Topics   Alcohol use: Yes    Alcohol/week: 7.0 standard drinks    Types: 7 Glasses of wine per week    Comment: 1 glass wine daily   Drug use: No    Home Medications Prior to Admission medications   Medication Sig Start Date End Date Taking? Authorizing Provider  blood glucose meter kit and supplies Dispense based on patient and insurance preference. Check blood sugars daily and as needed Dx: e11.9 03/14/21   Colon Branch, MD  ACCU-CHEK FASTCLIX LANCETS MISC Check blood sugar three times daily Patient not taking: Reported on 11/15/2020 02/12/18   Colon Branch, MD  bisacodyl (DULCOLAX) 5 MG EC tablet Take 5 mg daily as needed by mouth for moderate constipation. Patient not taking: No sig reported    [provider]  Blood Pressure KIT Take blood pressure twice weekly. 03/14/21   Colon Branch, MD  buPROPion (WELLBUTRIN) 75 MG tablet Take 1 tablet (75 mg total) by mouth 2 (two) times daily. 11/09/20   Colon Branch, MD  carbidopa-levodopa (SINEMET IR) 25-100 MG tablet Take 2 tablets by mouth 3 (three) times daily. 02/27/21   [name], [name], DO  FARXIGA 10 MG TABS tablet TAKE 1 TABLET(10 MG) BY MOUTH DAILY 01/08/21   Colon Branch, MD  gabapentin (NEURONTIN) 300 MG capsule TAKE 1 CAPSULE(300 MG) BY MOUTH TWICE DAILY 11/23/20   [name], [name], DO  GEMTESA 75 MG TABS Take 1 tablet by mouth daily. 11/02/20   [provider]  levothyroxine (SYNTHROID) 75 MCG tablet TAKE 1 TABLET(75 MCG) BY MOUTH DAILY BEFORE AND BREAKFAST 01/01/21   Colon Branch, MD  metFORMIN (GLUCOPHAGE) 1000 MG tablet TAKE 1 TABLET(1000 MG) BY MOUTH TWICE DAILY WITH A MEAL 10/10/20   Paz, [name], MD  polyethylene glycol powder [facility]) powder Take 17 g daily by mouth. 02/04/17   Marletta Lor, MD  sertraline (ZOLOFT) 100 MG tablet Take 1 tablet (100 mg total) by mouth daily. 12/07/20   Colon Branch, MD  simvastatin (ZOCOR) 40 MG tablet Take 1 tablet (40 mg total) by mouth at bedtime. 10/23/20   Colon Branch, MD  tamsulosin (  FLOMAX) 0.4 MG CAPS capsule Take 0.4 mg by mouth daily. 04/03/20   [provider]  vitamin B-12 (CYANOCOBALAMIN) 1000 MCG tablet Take 1,000 mcg by mouth every morning.     [provider]  VITAMIN D, ERGOCALCIFEROL, PO Take by mouth.    [provider]  XARELTO 20 MG TABS tablet TAKE 1 TABLET(20 MG) BY MOUTH DAILY 01/01/21   Croitoru, [name], MD    Allergies    Covid-19 mrna vacc (moderna) and Penicillins  Review of Systems   Review of Systems  Constitutional:  Negative for activity change, chills and fever.  HENT:  Negative for ear pain and sore throat.   Eyes:  Negative for pain and visual disturbance.  Respiratory:  Negative for cough and shortness of breath.   Cardiovascular:  Negative for chest pain and palpitations.  Gastrointestinal:  Negative for abdominal pain, nausea and vomiting.  Genitourinary:  Negative for dysuria, flank pain and hematuria.  Musculoskeletal:  Positive for arthralgias. Negative for back pain, gait problem, myalgias and neck pain.  Skin:  Negative for color change and rash.  Neurological:  Negative for dizziness, seizures, syncope, weakness and numbness.  Psychiatric/Behavioral:  Negative for confusion and decreased concentration.   All other systems reviewed and are negative.  Physical Exam Updated Vital Signs BP 105/62 (BP Location: Right Arm)     Pulse 65    Temp 97.6 F (36.4 C) (Oral)    Resp 18    Ht 5' 6" (1.676 m)    Wt 67.1 kg    SpO2 98%    BMI 23.88 kg/m   Physical Exam Vitals and nursing note reviewed.  Constitutional:      General: He is not in acute distress.    Appearance: Normal appearance. He is well-developed and normal weight. He is not ill-appearing, toxic-appearing or diaphoretic.  HENT:     Head: Normocephalic and atraumatic.     Right Ear: External ear normal.     Left Ear: External ear normal.     Nose: Nose normal.  Eyes:     Extraocular Movements: Extraocular movements intact.     Conjunctiva/sclera: Conjunctivae normal.  Cardiovascular:     Rate and Rhythm: Normal rate and regular rhythm.     Heart sounds: No murmur heard. Pulmonary:     Effort: Pulmonary effort is normal. No respiratory distress.     Breath sounds: Normal breath sounds.  Abdominal:     Palpations: Abdomen is soft.     Tenderness: There is no abdominal tenderness.  Musculoskeletal:        General: Tenderness, deformity and signs of injury present.     Cervical back: Normal range of motion and neck supple. No rigidity.  Skin:    General: Skin is warm and dry.     Capillary Refill: Capillary refill takes less than 2 seconds.     Coloration: Skin is not jaundiced or pale.  Neurological:     General: No focal deficit present.     Mental Status: He is alert and oriented to person, place, and time.     Cranial Nerves: No cranial nerve deficit.     Sensory: No sensory deficit.     Motor: No weakness.  Psychiatric:        Mood and Affect: Mood normal.        Behavior: Behavior normal.        Thought Content: Thought content normal.        Judgment: Judgment normal.  ED Results / Procedures / Treatments   Labs (all labs ordered are listed, but only abnormal results are displayed) Labs Reviewed - No data to display  EKG None  Radiology DG Finger Ring Left  Result Date: 03/19/2021 CLINICAL DATA:  Postreduction left  ring finger. EXAM: LEFT RING FINGER 2+V COMPARISON:  03/19/2021 FINDINGS: Intact alignment of the left fourth finger post reduction. The previous distal interphalangeal joint dislocation has resolved. Diffuse degenerative changes in the interphalangeal joints. No acute fractures are identified. Soft tissues are unremarkable. IMPRESSION: Anatomic alignment of the left fourth finger post reduction. No acute fractures identified. Degenerative changes in the interphalangeal joints. Electronically Signed   By: Lucienne Capers M.D.   On: 03/19/2021 17:33   DG Finger Ring Left  Result Date: 03/19/2021 CLINICAL DATA:  Injury, pain and deformity EXAM: LEFT RING FINGER 2+V COMPARISON:  None. FINDINGS: There is dorsal dislocation of the left fourth distal phalanx. No associated fracture. Moderate to severe osteoarthritic pattern arthrosis of the remaining included digits. Soft tissue edema of the digit. IMPRESSION: Dorsal dislocation of the left fourth distal phalanx. No associated fracture. Electronically Signed   By: Delanna Ahmadi M.D.   On: 03/19/2021 14:03    Procedures .Nerve Block  Date/Time: 03/19/2021 5:07 PM Performed by: Godfrey Pick, MD Authorized by: Godfrey Pick, MD   Consent:    Consent obtained:  Verbal   Consent given by:  Patient   Risks, benefits, and alternatives were discussed: yes     Risks discussed:  Pain, unsuccessful block and intravenous injection   Alternatives discussed:  No treatment Universal protocol:    Patient identity confirmed:  Verbally with patient Indications:    Indications:  Pain relief and procedural anesthesia Location:    Body area:  Upper extremity   Upper extremity nerve blocked: digital.   Laterality:  Left Pre-procedure details:    Skin preparation:  Povidone-iodine   Preparation: Patient was prepped and draped in usual sterile fashion   Skin anesthesia:    Skin anesthesia method:  None Procedure details:    Block needle gauge:  25 G   Anesthetic  injected:  Lidocaine 2% w/o epi   Steroid injected:  None   Additive injected:  None   Injection procedure:  Anatomic landmarks identified   Paresthesia:  None Post-procedure details:    Dressing:  None   Outcome:  Anesthesia achieved   Procedure completion:  Tolerated well, no immediate complications .Ortho Injury Treatment  Date/Time: 03/19/2021 5:08 PM Performed by: Godfrey Pick, MD Authorized by: Godfrey Pick, MD   Consent:    Consent obtained:  Verbal   Consent given by:  Patient   Risks discussed:  Irreducible dislocation and stiffness   Alternatives discussed:  No treatment and delayed treatmentInjury location: finger Location details: left ring finger Injury type: dislocation Dislocation type: DIP Pre-procedure neurovascular assessment: neurovascularly intact Pre-procedure distal perfusion: normal Pre-procedure neurological function: normal Pre-procedure range of motion: reduced Anesthesia: digital block  Anesthesia: Local anesthesia used: yes Local Anesthetic: lidocaine 2% without epinephrine Anesthetic total: 1.5 mL  Patient sedated: NoManipulation performed: yes Reduction successful: yes X-ray confirmed reduction: yes Immobilization: splint Splint type: static finger Splint Applied by: ED Provider Supplies used: aluminum splint Post-procedure neurovascular assessment: post-procedure neurovascularly intact Post-procedure distal perfusion: normal Post-procedure neurological function: normal Post-procedure range of motion: improved     Medications Ordered in ED Medications  lidocaine (XYLOCAINE) 2 % (with pres) injection 200 mg (200 mg Intradermal Given 03/19/21 1624)    ED  Course  I have reviewed the triage vital signs and the nursing notes.  Pertinent labs & imaging results that were available during my care of the patient were reviewed by me and considered in my medical decision making (see chart for details).    MDM Rules/Calculators/A&P                          85 year old male presenting for left ring finger injury.  Prior to being bedded in the ED, x-ray imaging was obtained.  X-ray imaging showed dorsal dislocation of the DIP with no associated fracture.  She decision-making discussion was had with the patient.  Patient did have a friend who attempted reduction at home.  This was unsuccessful and very painful to the patient.  Digital block was performed.  Successful reduction of DIP was performed.  Static finger splint was placed.  Patient was advised to follow-up with hand specialist.  He was discharged in good condition.  Final Clinical Impression(s) / ED Diagnoses Final diagnoses:  Closed traumatic dislocation of distal interphalangeal (DIP) joint of left ring finger    Rx / DC Orders ED Discharge Orders     None        Godfrey Pick, MD 03/19/21 1738

## 2021-03-20 ENCOUNTER — Other Ambulatory Visit: Payer: Self-pay

## 2021-03-20 MED ORDER — GABAPENTIN 300 MG PO CAPS: ORAL_CAPSULE | ORAL | 0 refills | Status: DC

## 2021-03-21 DIAGNOSIS — R260 Ataxic gait: Secondary | ICD-10-CM | POA: Diagnosis not present

## 2021-03-21 DIAGNOSIS — R2689 Other abnormalities of gait and mobility: Secondary | ICD-10-CM | POA: Diagnosis not present

## 2021-03-21 DIAGNOSIS — R2681 Unsteadiness on feet: Secondary | ICD-10-CM | POA: Diagnosis not present

## 2021-03-21 DIAGNOSIS — M6281 Muscle weakness (generalized): Secondary | ICD-10-CM | POA: Diagnosis not present

## 2021-03-21 DIAGNOSIS — G20 Parkinson's disease: Secondary | ICD-10-CM | POA: Diagnosis not present

## 2021-03-23 DIAGNOSIS — M79644 Pain in right finger(s): Secondary | ICD-10-CM | POA: Diagnosis not present

## 2021-03-24 DIAGNOSIS — S0001XA Abrasion of scalp, initial encounter: Secondary | ICD-10-CM | POA: Diagnosis not present

## 2021-03-24 DIAGNOSIS — S080XXA Avulsion of scalp, initial encounter: Secondary | ICD-10-CM | POA: Diagnosis not present

## 2021-03-24 DIAGNOSIS — M79605 Pain in left leg: Secondary | ICD-10-CM | POA: Diagnosis not present

## 2021-03-24 DIAGNOSIS — Z7901 Long term (current) use of anticoagulants: Secondary | ICD-10-CM | POA: Diagnosis not present

## 2021-03-24 DIAGNOSIS — M549 Dorsalgia, unspecified: Secondary | ICD-10-CM | POA: Diagnosis not present

## 2021-03-24 DIAGNOSIS — T1490XA Injury, unspecified, initial encounter: Secondary | ICD-10-CM | POA: Diagnosis not present

## 2021-03-24 DIAGNOSIS — R9082 White matter disease, unspecified: Secondary | ICD-10-CM | POA: Diagnosis not present

## 2021-03-24 DIAGNOSIS — M25561 Pain in right knee: Secondary | ICD-10-CM | POA: Diagnosis not present

## 2021-03-24 DIAGNOSIS — W19XXXA Unspecified fall, initial encounter: Secondary | ICD-10-CM | POA: Diagnosis not present

## 2021-03-24 DIAGNOSIS — Z87891 Personal history of nicotine dependence: Secondary | ICD-10-CM | POA: Diagnosis not present

## 2021-03-24 DIAGNOSIS — R531 Weakness: Secondary | ICD-10-CM | POA: Diagnosis not present

## 2021-03-24 DIAGNOSIS — S80212A Abrasion, left knee, initial encounter: Secondary | ICD-10-CM | POA: Diagnosis not present

## 2021-03-24 DIAGNOSIS — I252 Old myocardial infarction: Secondary | ICD-10-CM | POA: Diagnosis not present

## 2021-03-24 DIAGNOSIS — I452 Bifascicular block: Secondary | ICD-10-CM | POA: Diagnosis not present

## 2021-03-24 DIAGNOSIS — R930 Abnormal findings on diagnostic imaging of skull and head, not elsewhere classified: Secondary | ICD-10-CM | POA: Diagnosis not present

## 2021-03-24 DIAGNOSIS — Z743 Need for continuous supervision: Secondary | ICD-10-CM | POA: Diagnosis not present

## 2021-03-24 DIAGNOSIS — I10 Essential (primary) hypertension: Secondary | ICD-10-CM | POA: Diagnosis not present

## 2021-03-24 DIAGNOSIS — Y999 Unspecified external cause status: Secondary | ICD-10-CM | POA: Diagnosis not present

## 2021-03-26 DIAGNOSIS — G20 Parkinson's disease: Secondary | ICD-10-CM | POA: Diagnosis not present

## 2021-03-26 DIAGNOSIS — R2681 Unsteadiness on feet: Secondary | ICD-10-CM | POA: Diagnosis not present

## 2021-03-26 DIAGNOSIS — S63275A Dislocation of unspecified interphalangeal joint of left ring finger, initial encounter: Secondary | ICD-10-CM | POA: Diagnosis not present

## 2021-03-26 DIAGNOSIS — R2689 Other abnormalities of gait and mobility: Secondary | ICD-10-CM | POA: Diagnosis not present

## 2021-03-26 DIAGNOSIS — M6281 Muscle weakness (generalized): Secondary | ICD-10-CM | POA: Diagnosis not present

## 2021-03-26 DIAGNOSIS — R260 Ataxic gait: Secondary | ICD-10-CM | POA: Diagnosis not present

## 2021-03-27 DIAGNOSIS — I452 Bifascicular block: Secondary | ICD-10-CM | POA: Diagnosis not present

## 2021-03-27 DIAGNOSIS — I4510 Unspecified right bundle-branch block: Secondary | ICD-10-CM | POA: Diagnosis not present

## 2021-03-27 DIAGNOSIS — I444 Left anterior fascicular block: Secondary | ICD-10-CM | POA: Diagnosis not present

## 2021-03-28 DIAGNOSIS — M6281 Muscle weakness (generalized): Secondary | ICD-10-CM | POA: Diagnosis not present

## 2021-03-28 DIAGNOSIS — R2681 Unsteadiness on feet: Secondary | ICD-10-CM | POA: Diagnosis not present

## 2021-03-28 DIAGNOSIS — G20 Parkinson's disease: Secondary | ICD-10-CM | POA: Diagnosis not present

## 2021-03-28 DIAGNOSIS — R260 Ataxic gait: Secondary | ICD-10-CM | POA: Diagnosis not present

## 2021-03-28 DIAGNOSIS — S63275A Dislocation of unspecified interphalangeal joint of left ring finger, initial encounter: Secondary | ICD-10-CM | POA: Diagnosis not present

## 2021-03-28 DIAGNOSIS — R2689 Other abnormalities of gait and mobility: Secondary | ICD-10-CM | POA: Diagnosis not present

## 2021-03-30 DIAGNOSIS — S63275A Dislocation of unspecified interphalangeal joint of left ring finger, initial encounter: Secondary | ICD-10-CM | POA: Diagnosis not present

## 2021-03-30 DIAGNOSIS — R2689 Other abnormalities of gait and mobility: Secondary | ICD-10-CM | POA: Diagnosis not present

## 2021-03-30 DIAGNOSIS — M6281 Muscle weakness (generalized): Secondary | ICD-10-CM | POA: Diagnosis not present

## 2021-03-30 DIAGNOSIS — G20 Parkinson's disease: Secondary | ICD-10-CM | POA: Diagnosis not present

## 2021-03-30 DIAGNOSIS — R2681 Unsteadiness on feet: Secondary | ICD-10-CM | POA: Diagnosis not present

## 2021-03-30 DIAGNOSIS — R260 Ataxic gait: Secondary | ICD-10-CM | POA: Diagnosis not present

## 2021-04-02 DIAGNOSIS — R2689 Other abnormalities of gait and mobility: Secondary | ICD-10-CM | POA: Diagnosis not present

## 2021-04-02 DIAGNOSIS — G20 Parkinson's disease: Secondary | ICD-10-CM | POA: Diagnosis not present

## 2021-04-02 DIAGNOSIS — M6281 Muscle weakness (generalized): Secondary | ICD-10-CM | POA: Diagnosis not present

## 2021-04-02 DIAGNOSIS — S63275A Dislocation of unspecified interphalangeal joint of left ring finger, initial encounter: Secondary | ICD-10-CM | POA: Diagnosis not present

## 2021-04-02 DIAGNOSIS — R260 Ataxic gait: Secondary | ICD-10-CM | POA: Diagnosis not present

## 2021-04-02 DIAGNOSIS — R2681 Unsteadiness on feet: Secondary | ICD-10-CM | POA: Diagnosis not present

## 2021-04-03 DIAGNOSIS — R2689 Other abnormalities of gait and mobility: Secondary | ICD-10-CM | POA: Diagnosis not present

## 2021-04-03 DIAGNOSIS — R2681 Unsteadiness on feet: Secondary | ICD-10-CM | POA: Diagnosis not present

## 2021-04-03 DIAGNOSIS — M6281 Muscle weakness (generalized): Secondary | ICD-10-CM | POA: Diagnosis not present

## 2021-04-03 DIAGNOSIS — S63275A Dislocation of unspecified interphalangeal joint of left ring finger, initial encounter: Secondary | ICD-10-CM | POA: Diagnosis not present

## 2021-04-03 DIAGNOSIS — G20 Parkinson's disease: Secondary | ICD-10-CM | POA: Diagnosis not present

## 2021-04-03 DIAGNOSIS — R260 Ataxic gait: Secondary | ICD-10-CM | POA: Diagnosis not present

## 2021-04-04 DIAGNOSIS — M6281 Muscle weakness (generalized): Secondary | ICD-10-CM | POA: Diagnosis not present

## 2021-04-04 DIAGNOSIS — S63275A Dislocation of unspecified interphalangeal joint of left ring finger, initial encounter: Secondary | ICD-10-CM | POA: Diagnosis not present

## 2021-04-04 DIAGNOSIS — R2681 Unsteadiness on feet: Secondary | ICD-10-CM | POA: Diagnosis not present

## 2021-04-04 DIAGNOSIS — R2689 Other abnormalities of gait and mobility: Secondary | ICD-10-CM | POA: Diagnosis not present

## 2021-04-04 DIAGNOSIS — R260 Ataxic gait: Secondary | ICD-10-CM | POA: Diagnosis not present

## 2021-04-04 DIAGNOSIS — G20 Parkinson's disease: Secondary | ICD-10-CM | POA: Diagnosis not present

## 2021-04-05 DIAGNOSIS — S63275A Dislocation of unspecified interphalangeal joint of left ring finger, initial encounter: Secondary | ICD-10-CM | POA: Diagnosis not present

## 2021-04-05 DIAGNOSIS — R2689 Other abnormalities of gait and mobility: Secondary | ICD-10-CM | POA: Diagnosis not present

## 2021-04-05 DIAGNOSIS — R2681 Unsteadiness on feet: Secondary | ICD-10-CM | POA: Diagnosis not present

## 2021-04-05 DIAGNOSIS — M6281 Muscle weakness (generalized): Secondary | ICD-10-CM | POA: Diagnosis not present

## 2021-04-05 DIAGNOSIS — R260 Ataxic gait: Secondary | ICD-10-CM | POA: Diagnosis not present

## 2021-04-05 DIAGNOSIS — G20 Parkinson's disease: Secondary | ICD-10-CM | POA: Diagnosis not present

## 2021-04-06 ENCOUNTER — Other Ambulatory Visit: Payer: Self-pay | Admitting: Internal Medicine

## 2021-04-06 DIAGNOSIS — S63275A Dislocation of unspecified interphalangeal joint of left ring finger, initial encounter: Secondary | ICD-10-CM | POA: Diagnosis not present

## 2021-04-06 DIAGNOSIS — G20 Parkinson's disease: Secondary | ICD-10-CM | POA: Diagnosis not present

## 2021-04-06 DIAGNOSIS — R2689 Other abnormalities of gait and mobility: Secondary | ICD-10-CM | POA: Diagnosis not present

## 2021-04-06 DIAGNOSIS — M6281 Muscle weakness (generalized): Secondary | ICD-10-CM | POA: Diagnosis not present

## 2021-04-06 DIAGNOSIS — R2681 Unsteadiness on feet: Secondary | ICD-10-CM | POA: Diagnosis not present

## 2021-04-06 DIAGNOSIS — R260 Ataxic gait: Secondary | ICD-10-CM | POA: Diagnosis not present

## 2021-04-09 DIAGNOSIS — G20 Parkinson's disease: Secondary | ICD-10-CM | POA: Diagnosis not present

## 2021-04-09 DIAGNOSIS — M6281 Muscle weakness (generalized): Secondary | ICD-10-CM | POA: Diagnosis not present

## 2021-04-09 DIAGNOSIS — R2681 Unsteadiness on feet: Secondary | ICD-10-CM | POA: Diagnosis not present

## 2021-04-09 DIAGNOSIS — S63275A Dislocation of unspecified interphalangeal joint of left ring finger, initial encounter: Secondary | ICD-10-CM | POA: Diagnosis not present

## 2021-04-09 DIAGNOSIS — R2689 Other abnormalities of gait and mobility: Secondary | ICD-10-CM | POA: Diagnosis not present

## 2021-04-09 DIAGNOSIS — R260 Ataxic gait: Secondary | ICD-10-CM | POA: Diagnosis not present

## 2021-04-10 DIAGNOSIS — S63275A Dislocation of unspecified interphalangeal joint of left ring finger, initial encounter: Secondary | ICD-10-CM | POA: Diagnosis not present

## 2021-04-10 DIAGNOSIS — M6281 Muscle weakness (generalized): Secondary | ICD-10-CM | POA: Diagnosis not present

## 2021-04-10 DIAGNOSIS — R2689 Other abnormalities of gait and mobility: Secondary | ICD-10-CM | POA: Diagnosis not present

## 2021-04-10 DIAGNOSIS — R2681 Unsteadiness on feet: Secondary | ICD-10-CM | POA: Diagnosis not present

## 2021-04-10 DIAGNOSIS — G20 Parkinson's disease: Secondary | ICD-10-CM | POA: Diagnosis not present

## 2021-04-10 DIAGNOSIS — R260 Ataxic gait: Secondary | ICD-10-CM | POA: Diagnosis not present

## 2021-04-11 DIAGNOSIS — R2681 Unsteadiness on feet: Secondary | ICD-10-CM | POA: Diagnosis not present

## 2021-04-11 DIAGNOSIS — G20 Parkinson's disease: Secondary | ICD-10-CM | POA: Diagnosis not present

## 2021-04-11 DIAGNOSIS — S63275A Dislocation of unspecified interphalangeal joint of left ring finger, initial encounter: Secondary | ICD-10-CM | POA: Diagnosis not present

## 2021-04-11 DIAGNOSIS — R2689 Other abnormalities of gait and mobility: Secondary | ICD-10-CM | POA: Diagnosis not present

## 2021-04-11 DIAGNOSIS — M6281 Muscle weakness (generalized): Secondary | ICD-10-CM | POA: Diagnosis not present

## 2021-04-11 DIAGNOSIS — R260 Ataxic gait: Secondary | ICD-10-CM | POA: Diagnosis not present

## 2021-04-12 DIAGNOSIS — R260 Ataxic gait: Secondary | ICD-10-CM | POA: Diagnosis not present

## 2021-04-12 DIAGNOSIS — R2681 Unsteadiness on feet: Secondary | ICD-10-CM | POA: Diagnosis not present

## 2021-04-12 DIAGNOSIS — S63275A Dislocation of unspecified interphalangeal joint of left ring finger, initial encounter: Secondary | ICD-10-CM | POA: Diagnosis not present

## 2021-04-12 DIAGNOSIS — R2689 Other abnormalities of gait and mobility: Secondary | ICD-10-CM | POA: Diagnosis not present

## 2021-04-12 DIAGNOSIS — G20 Parkinson's disease: Secondary | ICD-10-CM | POA: Diagnosis not present

## 2021-04-12 DIAGNOSIS — M6281 Muscle weakness (generalized): Secondary | ICD-10-CM | POA: Diagnosis not present

## 2021-04-13 ENCOUNTER — Ambulatory Visit (INDEPENDENT_AMBULATORY_CARE_PROVIDER_SITE_OTHER): Payer: Medicare Other

## 2021-04-13 DIAGNOSIS — I495 Sick sinus syndrome: Secondary | ICD-10-CM

## 2021-04-13 LAB — CUP PACEART REMOTE DEVICE CHECK
Battery Remaining Longevity: 66 mo
Battery Remaining Percentage: 66 %
Brady Statistic RA Percent Paced: 29 %
Brady Statistic RV Percent Paced: 2 %
Date Time Interrogation Session: 20230120085800
Implantable Lead Implant Date: 20151013
Implantable Lead Implant Date: 20151013
Implantable Lead Location: 753859
Implantable Lead Location: 753860
Implantable Lead Model: 4135
Implantable Lead Model: 4136
Implantable Lead Serial Number: 29480373
Implantable Lead Serial Number: 29608302
Implantable Pulse Generator Implant Date: 20151013
Lead Channel Impedance Value: 495 Ohm
Lead Channel Impedance Value: 535 Ohm
Lead Channel Setting Pacing Amplitude: 2 V
Lead Channel Setting Pacing Amplitude: 4 V
Lead Channel Setting Pacing Pulse Width: 1 ms
Lead Channel Setting Sensing Sensitivity: 4 mV
Pulse Gen Serial Number: 702951

## 2021-04-16 ENCOUNTER — Other Ambulatory Visit: Payer: Self-pay | Admitting: Internal Medicine

## 2021-04-16 DIAGNOSIS — R260 Ataxic gait: Secondary | ICD-10-CM | POA: Diagnosis not present

## 2021-04-16 DIAGNOSIS — S63275A Dislocation of unspecified interphalangeal joint of left ring finger, initial encounter: Secondary | ICD-10-CM | POA: Diagnosis not present

## 2021-04-16 DIAGNOSIS — R2689 Other abnormalities of gait and mobility: Secondary | ICD-10-CM | POA: Diagnosis not present

## 2021-04-16 DIAGNOSIS — R2681 Unsteadiness on feet: Secondary | ICD-10-CM | POA: Diagnosis not present

## 2021-04-16 DIAGNOSIS — M6281 Muscle weakness (generalized): Secondary | ICD-10-CM | POA: Diagnosis not present

## 2021-04-16 DIAGNOSIS — G20 Parkinson's disease: Secondary | ICD-10-CM | POA: Diagnosis not present

## 2021-04-17 DIAGNOSIS — R2681 Unsteadiness on feet: Secondary | ICD-10-CM | POA: Diagnosis not present

## 2021-04-17 DIAGNOSIS — R260 Ataxic gait: Secondary | ICD-10-CM | POA: Diagnosis not present

## 2021-04-17 DIAGNOSIS — S63275A Dislocation of unspecified interphalangeal joint of left ring finger, initial encounter: Secondary | ICD-10-CM | POA: Diagnosis not present

## 2021-04-17 DIAGNOSIS — G20 Parkinson's disease: Secondary | ICD-10-CM | POA: Diagnosis not present

## 2021-04-17 DIAGNOSIS — M6281 Muscle weakness (generalized): Secondary | ICD-10-CM | POA: Diagnosis not present

## 2021-04-17 DIAGNOSIS — R2689 Other abnormalities of gait and mobility: Secondary | ICD-10-CM | POA: Diagnosis not present

## 2021-04-18 DIAGNOSIS — R260 Ataxic gait: Secondary | ICD-10-CM | POA: Diagnosis not present

## 2021-04-18 DIAGNOSIS — G20 Parkinson's disease: Secondary | ICD-10-CM | POA: Diagnosis not present

## 2021-04-18 DIAGNOSIS — R2681 Unsteadiness on feet: Secondary | ICD-10-CM | POA: Diagnosis not present

## 2021-04-18 DIAGNOSIS — R2689 Other abnormalities of gait and mobility: Secondary | ICD-10-CM | POA: Diagnosis not present

## 2021-04-18 DIAGNOSIS — M6281 Muscle weakness (generalized): Secondary | ICD-10-CM | POA: Diagnosis not present

## 2021-04-18 DIAGNOSIS — S63275A Dislocation of unspecified interphalangeal joint of left ring finger, initial encounter: Secondary | ICD-10-CM | POA: Diagnosis not present

## 2021-04-19 DIAGNOSIS — R2689 Other abnormalities of gait and mobility: Secondary | ICD-10-CM | POA: Diagnosis not present

## 2021-04-19 DIAGNOSIS — M6281 Muscle weakness (generalized): Secondary | ICD-10-CM | POA: Diagnosis not present

## 2021-04-19 DIAGNOSIS — S63275A Dislocation of unspecified interphalangeal joint of left ring finger, initial encounter: Secondary | ICD-10-CM | POA: Diagnosis not present

## 2021-04-19 DIAGNOSIS — R2681 Unsteadiness on feet: Secondary | ICD-10-CM | POA: Diagnosis not present

## 2021-04-19 DIAGNOSIS — G20 Parkinson's disease: Secondary | ICD-10-CM | POA: Diagnosis not present

## 2021-04-19 DIAGNOSIS — R260 Ataxic gait: Secondary | ICD-10-CM | POA: Diagnosis not present

## 2021-04-20 DIAGNOSIS — S63275A Dislocation of unspecified interphalangeal joint of left ring finger, initial encounter: Secondary | ICD-10-CM | POA: Diagnosis not present

## 2021-04-20 DIAGNOSIS — R2681 Unsteadiness on feet: Secondary | ICD-10-CM | POA: Diagnosis not present

## 2021-04-20 DIAGNOSIS — R2689 Other abnormalities of gait and mobility: Secondary | ICD-10-CM | POA: Diagnosis not present

## 2021-04-20 DIAGNOSIS — G20 Parkinson's disease: Secondary | ICD-10-CM | POA: Diagnosis not present

## 2021-04-20 DIAGNOSIS — R260 Ataxic gait: Secondary | ICD-10-CM | POA: Diagnosis not present

## 2021-04-20 DIAGNOSIS — M6281 Muscle weakness (generalized): Secondary | ICD-10-CM | POA: Diagnosis not present

## 2021-04-23 DIAGNOSIS — S63275A Dislocation of unspecified interphalangeal joint of left ring finger, initial encounter: Secondary | ICD-10-CM | POA: Diagnosis not present

## 2021-04-23 DIAGNOSIS — M6281 Muscle weakness (generalized): Secondary | ICD-10-CM | POA: Diagnosis not present

## 2021-04-23 DIAGNOSIS — R2689 Other abnormalities of gait and mobility: Secondary | ICD-10-CM | POA: Diagnosis not present

## 2021-04-23 DIAGNOSIS — G20 Parkinson's disease: Secondary | ICD-10-CM | POA: Diagnosis not present

## 2021-04-23 DIAGNOSIS — R2681 Unsteadiness on feet: Secondary | ICD-10-CM | POA: Diagnosis not present

## 2021-04-23 DIAGNOSIS — R260 Ataxic gait: Secondary | ICD-10-CM | POA: Diagnosis not present

## 2021-04-24 DIAGNOSIS — M6281 Muscle weakness (generalized): Secondary | ICD-10-CM | POA: Diagnosis not present

## 2021-04-24 DIAGNOSIS — R2681 Unsteadiness on feet: Secondary | ICD-10-CM | POA: Diagnosis not present

## 2021-04-24 DIAGNOSIS — S63275A Dislocation of unspecified interphalangeal joint of left ring finger, initial encounter: Secondary | ICD-10-CM | POA: Diagnosis not present

## 2021-04-24 DIAGNOSIS — R2689 Other abnormalities of gait and mobility: Secondary | ICD-10-CM | POA: Diagnosis not present

## 2021-04-24 DIAGNOSIS — R260 Ataxic gait: Secondary | ICD-10-CM | POA: Diagnosis not present

## 2021-04-24 DIAGNOSIS — G20 Parkinson's disease: Secondary | ICD-10-CM | POA: Diagnosis not present

## 2021-04-25 DIAGNOSIS — R2681 Unsteadiness on feet: Secondary | ICD-10-CM | POA: Diagnosis not present

## 2021-04-25 DIAGNOSIS — S63275A Dislocation of unspecified interphalangeal joint of left ring finger, initial encounter: Secondary | ICD-10-CM | POA: Diagnosis not present

## 2021-04-25 DIAGNOSIS — R260 Ataxic gait: Secondary | ICD-10-CM | POA: Diagnosis not present

## 2021-04-25 DIAGNOSIS — R2689 Other abnormalities of gait and mobility: Secondary | ICD-10-CM | POA: Diagnosis not present

## 2021-04-25 DIAGNOSIS — M6281 Muscle weakness (generalized): Secondary | ICD-10-CM | POA: Diagnosis not present

## 2021-04-25 DIAGNOSIS — G20 Parkinson's disease: Secondary | ICD-10-CM | POA: Diagnosis not present

## 2021-04-25 NOTE — Progress Notes (Signed)
Remote pacemaker transmission.   

## 2021-04-26 DIAGNOSIS — R2689 Other abnormalities of gait and mobility: Secondary | ICD-10-CM | POA: Diagnosis not present

## 2021-04-26 DIAGNOSIS — S63275A Dislocation of unspecified interphalangeal joint of left ring finger, initial encounter: Secondary | ICD-10-CM | POA: Diagnosis not present

## 2021-04-26 DIAGNOSIS — M6281 Muscle weakness (generalized): Secondary | ICD-10-CM | POA: Diagnosis not present

## 2021-04-26 DIAGNOSIS — R260 Ataxic gait: Secondary | ICD-10-CM | POA: Diagnosis not present

## 2021-04-26 DIAGNOSIS — G20 Parkinson's disease: Secondary | ICD-10-CM | POA: Diagnosis not present

## 2021-04-26 DIAGNOSIS — R2681 Unsteadiness on feet: Secondary | ICD-10-CM | POA: Diagnosis not present

## 2021-04-27 DIAGNOSIS — R2681 Unsteadiness on feet: Secondary | ICD-10-CM | POA: Diagnosis not present

## 2021-04-27 DIAGNOSIS — R260 Ataxic gait: Secondary | ICD-10-CM | POA: Diagnosis not present

## 2021-04-27 DIAGNOSIS — R2689 Other abnormalities of gait and mobility: Secondary | ICD-10-CM | POA: Diagnosis not present

## 2021-04-27 DIAGNOSIS — S63275A Dislocation of unspecified interphalangeal joint of left ring finger, initial encounter: Secondary | ICD-10-CM | POA: Diagnosis not present

## 2021-04-27 DIAGNOSIS — M6281 Muscle weakness (generalized): Secondary | ICD-10-CM | POA: Diagnosis not present

## 2021-04-27 DIAGNOSIS — G20 Parkinson's disease: Secondary | ICD-10-CM | POA: Diagnosis not present

## 2021-04-30 DIAGNOSIS — R2681 Unsteadiness on feet: Secondary | ICD-10-CM | POA: Diagnosis not present

## 2021-04-30 DIAGNOSIS — R2689 Other abnormalities of gait and mobility: Secondary | ICD-10-CM | POA: Diagnosis not present

## 2021-04-30 DIAGNOSIS — M6281 Muscle weakness (generalized): Secondary | ICD-10-CM | POA: Diagnosis not present

## 2021-04-30 DIAGNOSIS — S63275A Dislocation of unspecified interphalangeal joint of left ring finger, initial encounter: Secondary | ICD-10-CM | POA: Diagnosis not present

## 2021-04-30 DIAGNOSIS — G20 Parkinson's disease: Secondary | ICD-10-CM | POA: Diagnosis not present

## 2021-04-30 DIAGNOSIS — R260 Ataxic gait: Secondary | ICD-10-CM | POA: Diagnosis not present

## 2021-05-01 DIAGNOSIS — R260 Ataxic gait: Secondary | ICD-10-CM | POA: Diagnosis not present

## 2021-05-01 DIAGNOSIS — R2689 Other abnormalities of gait and mobility: Secondary | ICD-10-CM | POA: Diagnosis not present

## 2021-05-01 DIAGNOSIS — M6281 Muscle weakness (generalized): Secondary | ICD-10-CM | POA: Diagnosis not present

## 2021-05-01 DIAGNOSIS — R2681 Unsteadiness on feet: Secondary | ICD-10-CM | POA: Diagnosis not present

## 2021-05-01 DIAGNOSIS — S63275A Dislocation of unspecified interphalangeal joint of left ring finger, initial encounter: Secondary | ICD-10-CM | POA: Diagnosis not present

## 2021-05-01 DIAGNOSIS — G20 Parkinson's disease: Secondary | ICD-10-CM | POA: Diagnosis not present

## 2021-05-02 DIAGNOSIS — R2689 Other abnormalities of gait and mobility: Secondary | ICD-10-CM | POA: Diagnosis not present

## 2021-05-02 DIAGNOSIS — M6281 Muscle weakness (generalized): Secondary | ICD-10-CM | POA: Diagnosis not present

## 2021-05-02 DIAGNOSIS — R2681 Unsteadiness on feet: Secondary | ICD-10-CM | POA: Diagnosis not present

## 2021-05-02 DIAGNOSIS — S63275A Dislocation of unspecified interphalangeal joint of left ring finger, initial encounter: Secondary | ICD-10-CM | POA: Diagnosis not present

## 2021-05-02 DIAGNOSIS — R260 Ataxic gait: Secondary | ICD-10-CM | POA: Diagnosis not present

## 2021-05-02 DIAGNOSIS — G20 Parkinson's disease: Secondary | ICD-10-CM | POA: Diagnosis not present

## 2021-05-03 ENCOUNTER — Other Ambulatory Visit: Payer: Self-pay | Admitting: Internal Medicine

## 2021-05-03 DIAGNOSIS — R2689 Other abnormalities of gait and mobility: Secondary | ICD-10-CM | POA: Diagnosis not present

## 2021-05-03 DIAGNOSIS — G20 Parkinson's disease: Secondary | ICD-10-CM | POA: Diagnosis not present

## 2021-05-03 DIAGNOSIS — R2681 Unsteadiness on feet: Secondary | ICD-10-CM | POA: Diagnosis not present

## 2021-05-03 DIAGNOSIS — R260 Ataxic gait: Secondary | ICD-10-CM | POA: Diagnosis not present

## 2021-05-03 DIAGNOSIS — M6281 Muscle weakness (generalized): Secondary | ICD-10-CM | POA: Diagnosis not present

## 2021-05-03 DIAGNOSIS — M79674 Pain in right toe(s): Secondary | ICD-10-CM | POA: Diagnosis not present

## 2021-05-03 DIAGNOSIS — B351 Tinea unguium: Secondary | ICD-10-CM | POA: Diagnosis not present

## 2021-05-03 DIAGNOSIS — M79675 Pain in left toe(s): Secondary | ICD-10-CM | POA: Diagnosis not present

## 2021-05-03 DIAGNOSIS — S63275A Dislocation of unspecified interphalangeal joint of left ring finger, initial encounter: Secondary | ICD-10-CM | POA: Diagnosis not present

## 2021-05-04 DIAGNOSIS — R2689 Other abnormalities of gait and mobility: Secondary | ICD-10-CM | POA: Diagnosis not present

## 2021-05-04 DIAGNOSIS — R260 Ataxic gait: Secondary | ICD-10-CM | POA: Diagnosis not present

## 2021-05-04 DIAGNOSIS — R2681 Unsteadiness on feet: Secondary | ICD-10-CM | POA: Diagnosis not present

## 2021-05-04 DIAGNOSIS — G20 Parkinson's disease: Secondary | ICD-10-CM | POA: Diagnosis not present

## 2021-05-04 DIAGNOSIS — S63275A Dislocation of unspecified interphalangeal joint of left ring finger, initial encounter: Secondary | ICD-10-CM | POA: Diagnosis not present

## 2021-05-04 DIAGNOSIS — M6281 Muscle weakness (generalized): Secondary | ICD-10-CM | POA: Diagnosis not present

## 2021-05-07 DIAGNOSIS — R2681 Unsteadiness on feet: Secondary | ICD-10-CM | POA: Diagnosis not present

## 2021-05-07 DIAGNOSIS — R2689 Other abnormalities of gait and mobility: Secondary | ICD-10-CM | POA: Diagnosis not present

## 2021-05-07 DIAGNOSIS — M6281 Muscle weakness (generalized): Secondary | ICD-10-CM | POA: Diagnosis not present

## 2021-05-07 DIAGNOSIS — S63275A Dislocation of unspecified interphalangeal joint of left ring finger, initial encounter: Secondary | ICD-10-CM | POA: Diagnosis not present

## 2021-05-07 DIAGNOSIS — G20 Parkinson's disease: Secondary | ICD-10-CM | POA: Diagnosis not present

## 2021-05-07 DIAGNOSIS — R260 Ataxic gait: Secondary | ICD-10-CM | POA: Diagnosis not present

## 2021-05-08 DIAGNOSIS — S63275A Dislocation of unspecified interphalangeal joint of left ring finger, initial encounter: Secondary | ICD-10-CM | POA: Diagnosis not present

## 2021-05-08 DIAGNOSIS — G20 Parkinson's disease: Secondary | ICD-10-CM | POA: Diagnosis not present

## 2021-05-08 DIAGNOSIS — R260 Ataxic gait: Secondary | ICD-10-CM | POA: Diagnosis not present

## 2021-05-08 DIAGNOSIS — M6281 Muscle weakness (generalized): Secondary | ICD-10-CM | POA: Diagnosis not present

## 2021-05-08 DIAGNOSIS — R2689 Other abnormalities of gait and mobility: Secondary | ICD-10-CM | POA: Diagnosis not present

## 2021-05-08 DIAGNOSIS — R2681 Unsteadiness on feet: Secondary | ICD-10-CM | POA: Diagnosis not present

## 2021-05-10 DIAGNOSIS — G20 Parkinson's disease: Secondary | ICD-10-CM | POA: Diagnosis not present

## 2021-05-10 DIAGNOSIS — S63275A Dislocation of unspecified interphalangeal joint of left ring finger, initial encounter: Secondary | ICD-10-CM | POA: Diagnosis not present

## 2021-05-10 DIAGNOSIS — R296 Repeated falls: Secondary | ICD-10-CM | POA: Diagnosis not present

## 2021-05-10 DIAGNOSIS — M6281 Muscle weakness (generalized): Secondary | ICD-10-CM | POA: Diagnosis not present

## 2021-05-10 DIAGNOSIS — R260 Ataxic gait: Secondary | ICD-10-CM | POA: Diagnosis not present

## 2021-05-10 DIAGNOSIS — Z9181 History of falling: Secondary | ICD-10-CM | POA: Diagnosis not present

## 2021-05-10 DIAGNOSIS — M25551 Pain in right hip: Secondary | ICD-10-CM | POA: Diagnosis not present

## 2021-05-10 DIAGNOSIS — R2689 Other abnormalities of gait and mobility: Secondary | ICD-10-CM | POA: Diagnosis not present

## 2021-05-10 DIAGNOSIS — R2681 Unsteadiness on feet: Secondary | ICD-10-CM | POA: Diagnosis not present

## 2021-05-11 DIAGNOSIS — I482 Chronic atrial fibrillation, unspecified: Secondary | ICD-10-CM | POA: Diagnosis not present

## 2021-05-11 DIAGNOSIS — E039 Hypothyroidism, unspecified: Secondary | ICD-10-CM | POA: Diagnosis not present

## 2021-05-11 DIAGNOSIS — R296 Repeated falls: Secondary | ICD-10-CM | POA: Diagnosis not present

## 2021-05-11 DIAGNOSIS — Z7409 Other reduced mobility: Secondary | ICD-10-CM | POA: Diagnosis not present

## 2021-05-11 DIAGNOSIS — E1142 Type 2 diabetes mellitus with diabetic polyneuropathy: Secondary | ICD-10-CM | POA: Diagnosis not present

## 2021-05-11 DIAGNOSIS — I10 Essential (primary) hypertension: Secondary | ICD-10-CM | POA: Diagnosis not present

## 2021-05-11 DIAGNOSIS — Z7984 Long term (current) use of oral hypoglycemic drugs: Secondary | ICD-10-CM | POA: Diagnosis not present

## 2021-05-11 DIAGNOSIS — E119 Type 2 diabetes mellitus without complications: Secondary | ICD-10-CM | POA: Diagnosis not present

## 2021-05-11 DIAGNOSIS — G20 Parkinson's disease: Secondary | ICD-10-CM | POA: Diagnosis not present

## 2021-05-14 DIAGNOSIS — G20 Parkinson's disease: Secondary | ICD-10-CM | POA: Diagnosis not present

## 2021-05-14 DIAGNOSIS — Z9181 History of falling: Secondary | ICD-10-CM | POA: Diagnosis not present

## 2021-05-14 DIAGNOSIS — R2681 Unsteadiness on feet: Secondary | ICD-10-CM | POA: Diagnosis not present

## 2021-05-14 DIAGNOSIS — M6281 Muscle weakness (generalized): Secondary | ICD-10-CM | POA: Diagnosis not present

## 2021-05-14 DIAGNOSIS — R2689 Other abnormalities of gait and mobility: Secondary | ICD-10-CM | POA: Diagnosis not present

## 2021-05-14 DIAGNOSIS — R296 Repeated falls: Secondary | ICD-10-CM | POA: Diagnosis not present

## 2021-05-15 DIAGNOSIS — M6281 Muscle weakness (generalized): Secondary | ICD-10-CM | POA: Diagnosis not present

## 2021-05-15 DIAGNOSIS — R296 Repeated falls: Secondary | ICD-10-CM | POA: Diagnosis not present

## 2021-05-15 DIAGNOSIS — G20 Parkinson's disease: Secondary | ICD-10-CM | POA: Diagnosis not present

## 2021-05-15 DIAGNOSIS — R2689 Other abnormalities of gait and mobility: Secondary | ICD-10-CM | POA: Diagnosis not present

## 2021-05-15 DIAGNOSIS — R2681 Unsteadiness on feet: Secondary | ICD-10-CM | POA: Diagnosis not present

## 2021-05-15 DIAGNOSIS — Z9181 History of falling: Secondary | ICD-10-CM | POA: Diagnosis not present

## 2021-05-16 ENCOUNTER — Telehealth: Payer: Self-pay | Admitting: Neurology

## 2021-05-16 DIAGNOSIS — G20 Parkinson's disease: Secondary | ICD-10-CM | POA: Diagnosis not present

## 2021-05-16 DIAGNOSIS — Z9181 History of falling: Secondary | ICD-10-CM | POA: Diagnosis not present

## 2021-05-16 DIAGNOSIS — R296 Repeated falls: Secondary | ICD-10-CM | POA: Diagnosis not present

## 2021-05-16 DIAGNOSIS — R2689 Other abnormalities of gait and mobility: Secondary | ICD-10-CM | POA: Diagnosis not present

## 2021-05-16 DIAGNOSIS — R2681 Unsteadiness on feet: Secondary | ICD-10-CM | POA: Diagnosis not present

## 2021-05-16 DIAGNOSIS — M6281 Muscle weakness (generalized): Secondary | ICD-10-CM | POA: Diagnosis not present

## 2021-05-16 NOTE — Telephone Encounter (Signed)
Pt's Aid Danae Chen called in stating the patient has fallen 3 times this past week and some confusion. She would like to see if he could get a sooner appt. He is on the wait list, but says it's not easy for him to be on the wait list due to needing 2-3 days notice to get transportation set up for him. She says she will be with the patient until 4:00 today.

## 2021-05-16 NOTE — Telephone Encounter (Signed)
Called patients caregiver Danae Chen back. I asked if patient had been screened for UTI she said she had tried but had not been able to get it done. Caregiver put me on the phone with nurse on the floor Veverly Fells and I asked if they could screen patient so we could be sure that wasn't the issue and I would send notes back for Dr,.[name] to determine if we need to get appointment in ASAP

## 2021-05-17 ENCOUNTER — Other Ambulatory Visit: Payer: Self-pay

## 2021-05-17 DIAGNOSIS — R296 Repeated falls: Secondary | ICD-10-CM | POA: Diagnosis not present

## 2021-05-17 DIAGNOSIS — R2689 Other abnormalities of gait and mobility: Secondary | ICD-10-CM | POA: Diagnosis not present

## 2021-05-17 DIAGNOSIS — M6281 Muscle weakness (generalized): Secondary | ICD-10-CM | POA: Diagnosis not present

## 2021-05-17 DIAGNOSIS — Z9181 History of falling: Secondary | ICD-10-CM | POA: Diagnosis not present

## 2021-05-17 DIAGNOSIS — R2681 Unsteadiness on feet: Secondary | ICD-10-CM | POA: Diagnosis not present

## 2021-05-17 DIAGNOSIS — G20 Parkinson's disease: Secondary | ICD-10-CM | POA: Diagnosis not present

## 2021-05-17 MED ORDER — GABAPENTIN 300 MG PO CAPS: ORAL_CAPSULE | ORAL | 0 refills | Status: DC

## 2021-05-17 NOTE — Telephone Encounter (Signed)
Called caregiver Danae Chen and she was with patient . I was able to communicate with hr to get patient an appointment with PCP He has been having hip pain as well and to get that UA and stop the gabapentin. Patient was moved last Thursday to a higher level at [facility] and he is in skilled nursing at this time

## 2021-05-17 NOTE — Telephone Encounter (Signed)
Called Erica back and let her know we will be sending the order to discontinue the patients Gabapentin. Her agency is asking for orders for urinalysis and orders to see PCP as well as orders to have patient evaluated at his already existing physical therapy for a higher therapy level. I let her know these are recommendations from our office before we see patient to contact PCP to get a UA we do not treat UA and are not ordering a UA we recommend they do this to rule out other issues with patient as well as they do not need orders from our office to contact PCP. As far as evaluating patient at [facility] burn for PT he is already enrolled in PT and they only need to evaluate him for the recent falls because patient has hip and knee problems already.

## 2021-05-17 NOTE — Telephone Encounter (Signed)
Called to give the fax number and where to send papers to.  [facility] Attn: Ericka Pontiff Fax: [phone]

## 2021-05-18 DIAGNOSIS — M6281 Muscle weakness (generalized): Secondary | ICD-10-CM | POA: Diagnosis not present

## 2021-05-18 DIAGNOSIS — D649 Anemia, unspecified: Secondary | ICD-10-CM | POA: Diagnosis not present

## 2021-05-18 DIAGNOSIS — G20 Parkinson's disease: Secondary | ICD-10-CM | POA: Diagnosis not present

## 2021-05-18 DIAGNOSIS — R296 Repeated falls: Secondary | ICD-10-CM | POA: Diagnosis not present

## 2021-05-18 DIAGNOSIS — R2689 Other abnormalities of gait and mobility: Secondary | ICD-10-CM | POA: Diagnosis not present

## 2021-05-18 DIAGNOSIS — R2681 Unsteadiness on feet: Secondary | ICD-10-CM | POA: Diagnosis not present

## 2021-05-18 DIAGNOSIS — Z9181 History of falling: Secondary | ICD-10-CM | POA: Diagnosis not present

## 2021-05-21 DIAGNOSIS — Z9181 History of falling: Secondary | ICD-10-CM | POA: Diagnosis not present

## 2021-05-21 DIAGNOSIS — R2681 Unsteadiness on feet: Secondary | ICD-10-CM | POA: Diagnosis not present

## 2021-05-21 DIAGNOSIS — R296 Repeated falls: Secondary | ICD-10-CM | POA: Diagnosis not present

## 2021-05-21 DIAGNOSIS — M6281 Muscle weakness (generalized): Secondary | ICD-10-CM | POA: Diagnosis not present

## 2021-05-21 DIAGNOSIS — R2689 Other abnormalities of gait and mobility: Secondary | ICD-10-CM | POA: Diagnosis not present

## 2021-05-21 DIAGNOSIS — G20 Parkinson's disease: Secondary | ICD-10-CM | POA: Diagnosis not present

## 2021-05-22 DIAGNOSIS — R2689 Other abnormalities of gait and mobility: Secondary | ICD-10-CM | POA: Diagnosis not present

## 2021-05-22 DIAGNOSIS — G20 Parkinson's disease: Secondary | ICD-10-CM | POA: Diagnosis not present

## 2021-05-22 DIAGNOSIS — R296 Repeated falls: Secondary | ICD-10-CM | POA: Diagnosis not present

## 2021-05-22 DIAGNOSIS — M6281 Muscle weakness (generalized): Secondary | ICD-10-CM | POA: Diagnosis not present

## 2021-05-22 DIAGNOSIS — Z9181 History of falling: Secondary | ICD-10-CM | POA: Diagnosis not present

## 2021-05-22 DIAGNOSIS — R2681 Unsteadiness on feet: Secondary | ICD-10-CM | POA: Diagnosis not present

## 2021-05-23 DIAGNOSIS — R2681 Unsteadiness on feet: Secondary | ICD-10-CM | POA: Diagnosis not present

## 2021-05-23 DIAGNOSIS — M6281 Muscle weakness (generalized): Secondary | ICD-10-CM | POA: Diagnosis not present

## 2021-05-23 DIAGNOSIS — Z9181 History of falling: Secondary | ICD-10-CM | POA: Diagnosis not present

## 2021-05-23 DIAGNOSIS — R296 Repeated falls: Secondary | ICD-10-CM | POA: Diagnosis not present

## 2021-05-23 DIAGNOSIS — M25551 Pain in right hip: Secondary | ICD-10-CM | POA: Diagnosis not present

## 2021-05-23 DIAGNOSIS — S63275A Dislocation of unspecified interphalangeal joint of left ring finger, initial encounter: Secondary | ICD-10-CM | POA: Diagnosis not present

## 2021-05-23 DIAGNOSIS — R2689 Other abnormalities of gait and mobility: Secondary | ICD-10-CM | POA: Diagnosis not present

## 2021-05-23 DIAGNOSIS — G20 Parkinson's disease: Secondary | ICD-10-CM | POA: Diagnosis not present

## 2021-05-24 DIAGNOSIS — M6281 Muscle weakness (generalized): Secondary | ICD-10-CM | POA: Diagnosis not present

## 2021-05-24 DIAGNOSIS — M25551 Pain in right hip: Secondary | ICD-10-CM | POA: Diagnosis not present

## 2021-05-24 DIAGNOSIS — R2681 Unsteadiness on feet: Secondary | ICD-10-CM | POA: Diagnosis not present

## 2021-05-24 DIAGNOSIS — R2689 Other abnormalities of gait and mobility: Secondary | ICD-10-CM | POA: Diagnosis not present

## 2021-05-24 DIAGNOSIS — S63275A Dislocation of unspecified interphalangeal joint of left ring finger, initial encounter: Secondary | ICD-10-CM | POA: Diagnosis not present

## 2021-05-24 DIAGNOSIS — Z9181 History of falling: Secondary | ICD-10-CM | POA: Diagnosis not present

## 2021-05-25 DIAGNOSIS — R2689 Other abnormalities of gait and mobility: Secondary | ICD-10-CM | POA: Diagnosis not present

## 2021-05-25 DIAGNOSIS — M25551 Pain in right hip: Secondary | ICD-10-CM | POA: Diagnosis not present

## 2021-05-25 DIAGNOSIS — Z9181 History of falling: Secondary | ICD-10-CM | POA: Diagnosis not present

## 2021-05-25 DIAGNOSIS — S63275A Dislocation of unspecified interphalangeal joint of left ring finger, initial encounter: Secondary | ICD-10-CM | POA: Diagnosis not present

## 2021-05-25 DIAGNOSIS — M6281 Muscle weakness (generalized): Secondary | ICD-10-CM | POA: Diagnosis not present

## 2021-05-25 DIAGNOSIS — R2681 Unsteadiness on feet: Secondary | ICD-10-CM | POA: Diagnosis not present

## 2021-05-28 ENCOUNTER — Other Ambulatory Visit: Payer: Self-pay

## 2021-05-28 DIAGNOSIS — M6281 Muscle weakness (generalized): Secondary | ICD-10-CM | POA: Diagnosis not present

## 2021-05-28 DIAGNOSIS — M25551 Pain in right hip: Secondary | ICD-10-CM | POA: Diagnosis not present

## 2021-05-28 DIAGNOSIS — S63275A Dislocation of unspecified interphalangeal joint of left ring finger, initial encounter: Secondary | ICD-10-CM | POA: Diagnosis not present

## 2021-05-28 DIAGNOSIS — G20 Parkinson's disease: Secondary | ICD-10-CM

## 2021-05-28 DIAGNOSIS — Z9181 History of falling: Secondary | ICD-10-CM | POA: Diagnosis not present

## 2021-05-28 DIAGNOSIS — R2681 Unsteadiness on feet: Secondary | ICD-10-CM | POA: Diagnosis not present

## 2021-05-28 DIAGNOSIS — R2689 Other abnormalities of gait and mobility: Secondary | ICD-10-CM | POA: Diagnosis not present

## 2021-05-28 MED ORDER — CARBIDOPA-LEVODOPA 25-100 MG PO TABS: 2.0000 | ORAL_TABLET | Freq: Three times a day (TID) | ORAL | 0 refills | Status: DC

## 2021-05-29 DIAGNOSIS — R2681 Unsteadiness on feet: Secondary | ICD-10-CM | POA: Diagnosis not present

## 2021-05-29 DIAGNOSIS — F4323 Adjustment disorder with mixed anxiety and depressed mood: Secondary | ICD-10-CM | POA: Diagnosis not present

## 2021-05-29 DIAGNOSIS — F331 Major depressive disorder, recurrent, moderate: Secondary | ICD-10-CM | POA: Diagnosis not present

## 2021-05-29 DIAGNOSIS — M6281 Muscle weakness (generalized): Secondary | ICD-10-CM | POA: Diagnosis not present

## 2021-05-29 DIAGNOSIS — R4189 Other symptoms and signs involving cognitive functions and awareness: Secondary | ICD-10-CM | POA: Diagnosis not present

## 2021-05-29 DIAGNOSIS — M25551 Pain in right hip: Secondary | ICD-10-CM | POA: Diagnosis not present

## 2021-05-29 DIAGNOSIS — L821 Other seborrheic keratosis: Secondary | ICD-10-CM | POA: Diagnosis not present

## 2021-05-29 DIAGNOSIS — S63275A Dislocation of unspecified interphalangeal joint of left ring finger, initial encounter: Secondary | ICD-10-CM | POA: Diagnosis not present

## 2021-05-29 DIAGNOSIS — L570 Actinic keratosis: Secondary | ICD-10-CM | POA: Diagnosis not present

## 2021-05-29 DIAGNOSIS — Z9181 History of falling: Secondary | ICD-10-CM | POA: Diagnosis not present

## 2021-05-29 DIAGNOSIS — L814 Other melanin hyperpigmentation: Secondary | ICD-10-CM | POA: Diagnosis not present

## 2021-05-29 DIAGNOSIS — R2689 Other abnormalities of gait and mobility: Secondary | ICD-10-CM | POA: Diagnosis not present

## 2021-05-29 DIAGNOSIS — D1801 Hemangioma of skin and subcutaneous tissue: Secondary | ICD-10-CM | POA: Diagnosis not present

## 2021-05-29 DIAGNOSIS — F411 Generalized anxiety disorder: Secondary | ICD-10-CM | POA: Diagnosis not present

## 2021-05-29 DIAGNOSIS — Z85828 Personal history of other malignant neoplasm of skin: Secondary | ICD-10-CM | POA: Diagnosis not present

## 2021-05-30 DIAGNOSIS — M6281 Muscle weakness (generalized): Secondary | ICD-10-CM | POA: Diagnosis not present

## 2021-05-30 DIAGNOSIS — S63275A Dislocation of unspecified interphalangeal joint of left ring finger, initial encounter: Secondary | ICD-10-CM | POA: Diagnosis not present

## 2021-05-30 DIAGNOSIS — R2689 Other abnormalities of gait and mobility: Secondary | ICD-10-CM | POA: Diagnosis not present

## 2021-05-30 DIAGNOSIS — Z9181 History of falling: Secondary | ICD-10-CM | POA: Diagnosis not present

## 2021-05-30 DIAGNOSIS — R2681 Unsteadiness on feet: Secondary | ICD-10-CM | POA: Diagnosis not present

## 2021-05-30 DIAGNOSIS — M25551 Pain in right hip: Secondary | ICD-10-CM | POA: Diagnosis not present

## 2021-05-31 DIAGNOSIS — S63275A Dislocation of unspecified interphalangeal joint of left ring finger, initial encounter: Secondary | ICD-10-CM | POA: Diagnosis not present

## 2021-05-31 DIAGNOSIS — M25551 Pain in right hip: Secondary | ICD-10-CM | POA: Diagnosis not present

## 2021-05-31 DIAGNOSIS — Z9181 History of falling: Secondary | ICD-10-CM | POA: Diagnosis not present

## 2021-05-31 DIAGNOSIS — M6281 Muscle weakness (generalized): Secondary | ICD-10-CM | POA: Diagnosis not present

## 2021-05-31 DIAGNOSIS — R2689 Other abnormalities of gait and mobility: Secondary | ICD-10-CM | POA: Diagnosis not present

## 2021-05-31 DIAGNOSIS — R2681 Unsteadiness on feet: Secondary | ICD-10-CM | POA: Diagnosis not present

## 2021-06-01 DIAGNOSIS — Z9181 History of falling: Secondary | ICD-10-CM | POA: Diagnosis not present

## 2021-06-01 DIAGNOSIS — R2689 Other abnormalities of gait and mobility: Secondary | ICD-10-CM | POA: Diagnosis not present

## 2021-06-01 DIAGNOSIS — M6281 Muscle weakness (generalized): Secondary | ICD-10-CM | POA: Diagnosis not present

## 2021-06-01 DIAGNOSIS — M25551 Pain in right hip: Secondary | ICD-10-CM | POA: Diagnosis not present

## 2021-06-01 DIAGNOSIS — S63275A Dislocation of unspecified interphalangeal joint of left ring finger, initial encounter: Secondary | ICD-10-CM | POA: Diagnosis not present

## 2021-06-01 DIAGNOSIS — R2681 Unsteadiness on feet: Secondary | ICD-10-CM | POA: Diagnosis not present

## 2021-06-04 ENCOUNTER — Other Ambulatory Visit: Payer: Self-pay | Admitting: Internal Medicine

## 2021-06-04 DIAGNOSIS — M25551 Pain in right hip: Secondary | ICD-10-CM | POA: Diagnosis not present

## 2021-06-04 DIAGNOSIS — S63275A Dislocation of unspecified interphalangeal joint of left ring finger, initial encounter: Secondary | ICD-10-CM | POA: Diagnosis not present

## 2021-06-04 DIAGNOSIS — R2689 Other abnormalities of gait and mobility: Secondary | ICD-10-CM | POA: Diagnosis not present

## 2021-06-04 DIAGNOSIS — R2681 Unsteadiness on feet: Secondary | ICD-10-CM | POA: Diagnosis not present

## 2021-06-04 DIAGNOSIS — M6281 Muscle weakness (generalized): Secondary | ICD-10-CM | POA: Diagnosis not present

## 2021-06-04 DIAGNOSIS — Z9181 History of falling: Secondary | ICD-10-CM | POA: Diagnosis not present

## 2021-06-05 DIAGNOSIS — M6281 Muscle weakness (generalized): Secondary | ICD-10-CM | POA: Diagnosis not present

## 2021-06-05 DIAGNOSIS — R2681 Unsteadiness on feet: Secondary | ICD-10-CM | POA: Diagnosis not present

## 2021-06-05 DIAGNOSIS — M25551 Pain in right hip: Secondary | ICD-10-CM | POA: Diagnosis not present

## 2021-06-05 DIAGNOSIS — R2689 Other abnormalities of gait and mobility: Secondary | ICD-10-CM | POA: Diagnosis not present

## 2021-06-05 DIAGNOSIS — Z9181 History of falling: Secondary | ICD-10-CM | POA: Diagnosis not present

## 2021-06-05 DIAGNOSIS — S63275A Dislocation of unspecified interphalangeal joint of left ring finger, initial encounter: Secondary | ICD-10-CM | POA: Diagnosis not present

## 2021-06-06 DIAGNOSIS — S63275A Dislocation of unspecified interphalangeal joint of left ring finger, initial encounter: Secondary | ICD-10-CM | POA: Diagnosis not present

## 2021-06-06 DIAGNOSIS — Z9181 History of falling: Secondary | ICD-10-CM | POA: Diagnosis not present

## 2021-06-06 DIAGNOSIS — R2689 Other abnormalities of gait and mobility: Secondary | ICD-10-CM | POA: Diagnosis not present

## 2021-06-06 DIAGNOSIS — M25551 Pain in right hip: Secondary | ICD-10-CM | POA: Diagnosis not present

## 2021-06-06 DIAGNOSIS — M6281 Muscle weakness (generalized): Secondary | ICD-10-CM | POA: Diagnosis not present

## 2021-06-06 DIAGNOSIS — R2681 Unsteadiness on feet: Secondary | ICD-10-CM | POA: Diagnosis not present

## 2021-06-07 DIAGNOSIS — M25551 Pain in right hip: Secondary | ICD-10-CM | POA: Diagnosis not present

## 2021-06-07 DIAGNOSIS — Z9181 History of falling: Secondary | ICD-10-CM | POA: Diagnosis not present

## 2021-06-07 DIAGNOSIS — M6281 Muscle weakness (generalized): Secondary | ICD-10-CM | POA: Diagnosis not present

## 2021-06-07 DIAGNOSIS — R2689 Other abnormalities of gait and mobility: Secondary | ICD-10-CM | POA: Diagnosis not present

## 2021-06-07 DIAGNOSIS — R2681 Unsteadiness on feet: Secondary | ICD-10-CM | POA: Diagnosis not present

## 2021-06-07 DIAGNOSIS — S63275A Dislocation of unspecified interphalangeal joint of left ring finger, initial encounter: Secondary | ICD-10-CM | POA: Diagnosis not present

## 2021-06-09 DIAGNOSIS — M6281 Muscle weakness (generalized): Secondary | ICD-10-CM | POA: Diagnosis not present

## 2021-06-09 DIAGNOSIS — M25551 Pain in right hip: Secondary | ICD-10-CM | POA: Diagnosis not present

## 2021-06-09 DIAGNOSIS — R2681 Unsteadiness on feet: Secondary | ICD-10-CM | POA: Diagnosis not present

## 2021-06-09 DIAGNOSIS — Z9181 History of falling: Secondary | ICD-10-CM | POA: Diagnosis not present

## 2021-06-09 DIAGNOSIS — S63275A Dislocation of unspecified interphalangeal joint of left ring finger, initial encounter: Secondary | ICD-10-CM | POA: Diagnosis not present

## 2021-06-09 DIAGNOSIS — R2689 Other abnormalities of gait and mobility: Secondary | ICD-10-CM | POA: Diagnosis not present

## 2021-06-11 DIAGNOSIS — M25551 Pain in right hip: Secondary | ICD-10-CM | POA: Diagnosis not present

## 2021-06-11 DIAGNOSIS — R2689 Other abnormalities of gait and mobility: Secondary | ICD-10-CM | POA: Diagnosis not present

## 2021-06-11 DIAGNOSIS — Z9181 History of falling: Secondary | ICD-10-CM | POA: Diagnosis not present

## 2021-06-11 DIAGNOSIS — R2681 Unsteadiness on feet: Secondary | ICD-10-CM | POA: Diagnosis not present

## 2021-06-11 DIAGNOSIS — S63275A Dislocation of unspecified interphalangeal joint of left ring finger, initial encounter: Secondary | ICD-10-CM | POA: Diagnosis not present

## 2021-06-11 DIAGNOSIS — M6281 Muscle weakness (generalized): Secondary | ICD-10-CM | POA: Diagnosis not present

## 2021-06-12 DIAGNOSIS — M6281 Muscle weakness (generalized): Secondary | ICD-10-CM | POA: Diagnosis not present

## 2021-06-12 DIAGNOSIS — R2689 Other abnormalities of gait and mobility: Secondary | ICD-10-CM | POA: Diagnosis not present

## 2021-06-12 DIAGNOSIS — M25551 Pain in right hip: Secondary | ICD-10-CM | POA: Diagnosis not present

## 2021-06-12 DIAGNOSIS — Z9181 History of falling: Secondary | ICD-10-CM | POA: Diagnosis not present

## 2021-06-12 DIAGNOSIS — R2681 Unsteadiness on feet: Secondary | ICD-10-CM | POA: Diagnosis not present

## 2021-06-12 DIAGNOSIS — S63275A Dislocation of unspecified interphalangeal joint of left ring finger, initial encounter: Secondary | ICD-10-CM | POA: Diagnosis not present

## 2021-06-13 DIAGNOSIS — R2689 Other abnormalities of gait and mobility: Secondary | ICD-10-CM | POA: Diagnosis not present

## 2021-06-13 DIAGNOSIS — S63275A Dislocation of unspecified interphalangeal joint of left ring finger, initial encounter: Secondary | ICD-10-CM | POA: Diagnosis not present

## 2021-06-13 DIAGNOSIS — Z9181 History of falling: Secondary | ICD-10-CM | POA: Diagnosis not present

## 2021-06-13 DIAGNOSIS — R2681 Unsteadiness on feet: Secondary | ICD-10-CM | POA: Diagnosis not present

## 2021-06-13 DIAGNOSIS — M25551 Pain in right hip: Secondary | ICD-10-CM | POA: Diagnosis not present

## 2021-06-13 DIAGNOSIS — M6281 Muscle weakness (generalized): Secondary | ICD-10-CM | POA: Diagnosis not present

## 2021-06-14 DIAGNOSIS — R2689 Other abnormalities of gait and mobility: Secondary | ICD-10-CM | POA: Diagnosis not present

## 2021-06-14 DIAGNOSIS — R634 Abnormal weight loss: Secondary | ICD-10-CM | POA: Diagnosis not present

## 2021-06-14 DIAGNOSIS — E785 Hyperlipidemia, unspecified: Secondary | ICD-10-CM | POA: Diagnosis not present

## 2021-06-14 DIAGNOSIS — Z9181 History of falling: Secondary | ICD-10-CM | POA: Diagnosis not present

## 2021-06-14 DIAGNOSIS — R2681 Unsteadiness on feet: Secondary | ICD-10-CM | POA: Diagnosis not present

## 2021-06-14 DIAGNOSIS — M25551 Pain in right hip: Secondary | ICD-10-CM | POA: Diagnosis not present

## 2021-06-14 DIAGNOSIS — I480 Paroxysmal atrial fibrillation: Secondary | ICD-10-CM | POA: Diagnosis not present

## 2021-06-14 DIAGNOSIS — M6281 Muscle weakness (generalized): Secondary | ICD-10-CM | POA: Diagnosis not present

## 2021-06-14 DIAGNOSIS — G20 Parkinson's disease: Secondary | ICD-10-CM | POA: Diagnosis not present

## 2021-06-14 DIAGNOSIS — E119 Type 2 diabetes mellitus without complications: Secondary | ICD-10-CM | POA: Diagnosis not present

## 2021-06-14 DIAGNOSIS — N400 Enlarged prostate without lower urinary tract symptoms: Secondary | ICD-10-CM | POA: Diagnosis not present

## 2021-06-14 DIAGNOSIS — E039 Hypothyroidism, unspecified: Secondary | ICD-10-CM | POA: Diagnosis not present

## 2021-06-14 DIAGNOSIS — S63275A Dislocation of unspecified interphalangeal joint of left ring finger, initial encounter: Secondary | ICD-10-CM | POA: Diagnosis not present

## 2021-06-14 DIAGNOSIS — F418 Other specified anxiety disorders: Secondary | ICD-10-CM | POA: Diagnosis not present

## 2021-06-14 DIAGNOSIS — R296 Repeated falls: Secondary | ICD-10-CM | POA: Diagnosis not present

## 2021-06-14 DIAGNOSIS — Z7984 Long term (current) use of oral hypoglycemic drugs: Secondary | ICD-10-CM | POA: Diagnosis not present

## 2021-06-15 DIAGNOSIS — R2689 Other abnormalities of gait and mobility: Secondary | ICD-10-CM | POA: Diagnosis not present

## 2021-06-15 DIAGNOSIS — M25551 Pain in right hip: Secondary | ICD-10-CM | POA: Diagnosis not present

## 2021-06-15 DIAGNOSIS — M6281 Muscle weakness (generalized): Secondary | ICD-10-CM | POA: Diagnosis not present

## 2021-06-15 DIAGNOSIS — Z9181 History of falling: Secondary | ICD-10-CM | POA: Diagnosis not present

## 2021-06-15 DIAGNOSIS — S63275A Dislocation of unspecified interphalangeal joint of left ring finger, initial encounter: Secondary | ICD-10-CM | POA: Diagnosis not present

## 2021-06-15 DIAGNOSIS — R2681 Unsteadiness on feet: Secondary | ICD-10-CM | POA: Diagnosis not present

## 2021-06-16 ENCOUNTER — Other Ambulatory Visit: Payer: Self-pay | Admitting: Neurology

## 2021-06-18 DIAGNOSIS — R2689 Other abnormalities of gait and mobility: Secondary | ICD-10-CM | POA: Diagnosis not present

## 2021-06-18 DIAGNOSIS — M25551 Pain in right hip: Secondary | ICD-10-CM | POA: Diagnosis not present

## 2021-06-18 DIAGNOSIS — M6281 Muscle weakness (generalized): Secondary | ICD-10-CM | POA: Diagnosis not present

## 2021-06-18 DIAGNOSIS — S63275A Dislocation of unspecified interphalangeal joint of left ring finger, initial encounter: Secondary | ICD-10-CM | POA: Diagnosis not present

## 2021-06-18 DIAGNOSIS — Z9181 History of falling: Secondary | ICD-10-CM | POA: Diagnosis not present

## 2021-06-18 DIAGNOSIS — R2681 Unsteadiness on feet: Secondary | ICD-10-CM | POA: Diagnosis not present

## 2021-06-19 DIAGNOSIS — R2689 Other abnormalities of gait and mobility: Secondary | ICD-10-CM | POA: Diagnosis not present

## 2021-06-19 DIAGNOSIS — M6281 Muscle weakness (generalized): Secondary | ICD-10-CM | POA: Diagnosis not present

## 2021-06-19 DIAGNOSIS — Z9181 History of falling: Secondary | ICD-10-CM | POA: Diagnosis not present

## 2021-06-19 DIAGNOSIS — S63275A Dislocation of unspecified interphalangeal joint of left ring finger, initial encounter: Secondary | ICD-10-CM | POA: Diagnosis not present

## 2021-06-19 DIAGNOSIS — R2681 Unsteadiness on feet: Secondary | ICD-10-CM | POA: Diagnosis not present

## 2021-06-19 DIAGNOSIS — M25551 Pain in right hip: Secondary | ICD-10-CM | POA: Diagnosis not present

## 2021-06-20 DIAGNOSIS — R2689 Other abnormalities of gait and mobility: Secondary | ICD-10-CM | POA: Diagnosis not present

## 2021-06-20 DIAGNOSIS — S63275A Dislocation of unspecified interphalangeal joint of left ring finger, initial encounter: Secondary | ICD-10-CM | POA: Diagnosis not present

## 2021-06-20 DIAGNOSIS — R2681 Unsteadiness on feet: Secondary | ICD-10-CM | POA: Diagnosis not present

## 2021-06-20 DIAGNOSIS — M6281 Muscle weakness (generalized): Secondary | ICD-10-CM | POA: Diagnosis not present

## 2021-06-20 DIAGNOSIS — M25551 Pain in right hip: Secondary | ICD-10-CM | POA: Diagnosis not present

## 2021-06-20 DIAGNOSIS — Z9181 History of falling: Secondary | ICD-10-CM | POA: Diagnosis not present

## 2021-06-21 DIAGNOSIS — Z9181 History of falling: Secondary | ICD-10-CM | POA: Diagnosis not present

## 2021-06-21 DIAGNOSIS — M25551 Pain in right hip: Secondary | ICD-10-CM | POA: Diagnosis not present

## 2021-06-21 DIAGNOSIS — M6281 Muscle weakness (generalized): Secondary | ICD-10-CM | POA: Diagnosis not present

## 2021-06-21 DIAGNOSIS — S63275A Dislocation of unspecified interphalangeal joint of left ring finger, initial encounter: Secondary | ICD-10-CM | POA: Diagnosis not present

## 2021-06-21 DIAGNOSIS — R2689 Other abnormalities of gait and mobility: Secondary | ICD-10-CM | POA: Diagnosis not present

## 2021-06-21 DIAGNOSIS — R2681 Unsteadiness on feet: Secondary | ICD-10-CM | POA: Diagnosis not present

## 2021-06-22 DIAGNOSIS — M25551 Pain in right hip: Secondary | ICD-10-CM | POA: Diagnosis not present

## 2021-06-22 DIAGNOSIS — M6281 Muscle weakness (generalized): Secondary | ICD-10-CM | POA: Diagnosis not present

## 2021-06-22 DIAGNOSIS — S63275A Dislocation of unspecified interphalangeal joint of left ring finger, initial encounter: Secondary | ICD-10-CM | POA: Diagnosis not present

## 2021-06-22 DIAGNOSIS — Z9181 History of falling: Secondary | ICD-10-CM | POA: Diagnosis not present

## 2021-06-22 DIAGNOSIS — R2689 Other abnormalities of gait and mobility: Secondary | ICD-10-CM | POA: Diagnosis not present

## 2021-06-22 DIAGNOSIS — R2681 Unsteadiness on feet: Secondary | ICD-10-CM | POA: Diagnosis not present

## 2021-06-25 ENCOUNTER — Other Ambulatory Visit: Payer: Self-pay | Admitting: Cardiovascular Disease

## 2021-06-25 ENCOUNTER — Other Ambulatory Visit: Payer: Self-pay | Admitting: Internal Medicine

## 2021-06-25 DIAGNOSIS — M25551 Pain in right hip: Secondary | ICD-10-CM | POA: Diagnosis not present

## 2021-06-25 DIAGNOSIS — G20 Parkinson's disease: Secondary | ICD-10-CM | POA: Diagnosis not present

## 2021-06-25 DIAGNOSIS — R2689 Other abnormalities of gait and mobility: Secondary | ICD-10-CM | POA: Diagnosis not present

## 2021-06-25 DIAGNOSIS — Z9181 History of falling: Secondary | ICD-10-CM | POA: Diagnosis not present

## 2021-06-25 DIAGNOSIS — S63275A Dislocation of unspecified interphalangeal joint of left ring finger, initial encounter: Secondary | ICD-10-CM | POA: Diagnosis not present

## 2021-06-25 DIAGNOSIS — M6281 Muscle weakness (generalized): Secondary | ICD-10-CM | POA: Diagnosis not present

## 2021-06-25 DIAGNOSIS — R2681 Unsteadiness on feet: Secondary | ICD-10-CM | POA: Diagnosis not present

## 2021-06-25 DIAGNOSIS — R296 Repeated falls: Secondary | ICD-10-CM | POA: Diagnosis not present

## 2021-06-25 NOTE — Telephone Encounter (Signed)
Prescription refill request for Xarelto received.  ?Indication:Afib ?Last office visit:Needs appointment ?Weight:67.1 kg ?Age:86 ?Scr:0.6 ?CrCl:88.53 ? ?Prescription refilled ? ?

## 2021-06-26 DIAGNOSIS — M6281 Muscle weakness (generalized): Secondary | ICD-10-CM | POA: Diagnosis not present

## 2021-06-26 DIAGNOSIS — L821 Other seborrheic keratosis: Secondary | ICD-10-CM | POA: Diagnosis not present

## 2021-06-26 DIAGNOSIS — L570 Actinic keratosis: Secondary | ICD-10-CM | POA: Diagnosis not present

## 2021-06-26 DIAGNOSIS — Z9181 History of falling: Secondary | ICD-10-CM | POA: Diagnosis not present

## 2021-06-26 DIAGNOSIS — R296 Repeated falls: Secondary | ICD-10-CM | POA: Diagnosis not present

## 2021-06-26 DIAGNOSIS — R2681 Unsteadiness on feet: Secondary | ICD-10-CM | POA: Diagnosis not present

## 2021-06-26 DIAGNOSIS — Z85828 Personal history of other malignant neoplasm of skin: Secondary | ICD-10-CM | POA: Diagnosis not present

## 2021-06-26 DIAGNOSIS — L814 Other melanin hyperpigmentation: Secondary | ICD-10-CM | POA: Diagnosis not present

## 2021-06-26 DIAGNOSIS — G20 Parkinson's disease: Secondary | ICD-10-CM | POA: Diagnosis not present

## 2021-06-26 DIAGNOSIS — S63275A Dislocation of unspecified interphalangeal joint of left ring finger, initial encounter: Secondary | ICD-10-CM | POA: Diagnosis not present

## 2021-06-27 DIAGNOSIS — G20 Parkinson's disease: Secondary | ICD-10-CM | POA: Diagnosis not present

## 2021-06-27 DIAGNOSIS — M6281 Muscle weakness (generalized): Secondary | ICD-10-CM | POA: Diagnosis not present

## 2021-06-27 DIAGNOSIS — R296 Repeated falls: Secondary | ICD-10-CM | POA: Diagnosis not present

## 2021-06-27 DIAGNOSIS — Z9181 History of falling: Secondary | ICD-10-CM | POA: Diagnosis not present

## 2021-06-27 DIAGNOSIS — S63275A Dislocation of unspecified interphalangeal joint of left ring finger, initial encounter: Secondary | ICD-10-CM | POA: Diagnosis not present

## 2021-06-27 DIAGNOSIS — R2681 Unsteadiness on feet: Secondary | ICD-10-CM | POA: Diagnosis not present

## 2021-06-28 DIAGNOSIS — R296 Repeated falls: Secondary | ICD-10-CM | POA: Diagnosis not present

## 2021-06-28 DIAGNOSIS — G20 Parkinson's disease: Secondary | ICD-10-CM | POA: Diagnosis not present

## 2021-06-28 DIAGNOSIS — M6281 Muscle weakness (generalized): Secondary | ICD-10-CM | POA: Diagnosis not present

## 2021-06-28 DIAGNOSIS — Z9181 History of falling: Secondary | ICD-10-CM | POA: Diagnosis not present

## 2021-06-28 DIAGNOSIS — R2681 Unsteadiness on feet: Secondary | ICD-10-CM | POA: Diagnosis not present

## 2021-06-28 DIAGNOSIS — S63275A Dislocation of unspecified interphalangeal joint of left ring finger, initial encounter: Secondary | ICD-10-CM | POA: Diagnosis not present

## 2021-06-29 DIAGNOSIS — M6281 Muscle weakness (generalized): Secondary | ICD-10-CM | POA: Diagnosis not present

## 2021-06-29 DIAGNOSIS — R2681 Unsteadiness on feet: Secondary | ICD-10-CM | POA: Diagnosis not present

## 2021-06-29 DIAGNOSIS — Z9181 History of falling: Secondary | ICD-10-CM | POA: Diagnosis not present

## 2021-06-29 DIAGNOSIS — G20 Parkinson's disease: Secondary | ICD-10-CM | POA: Diagnosis not present

## 2021-06-29 DIAGNOSIS — R296 Repeated falls: Secondary | ICD-10-CM | POA: Diagnosis not present

## 2021-06-29 DIAGNOSIS — S63275A Dislocation of unspecified interphalangeal joint of left ring finger, initial encounter: Secondary | ICD-10-CM | POA: Diagnosis not present

## 2021-07-02 ENCOUNTER — Other Ambulatory Visit: Payer: Self-pay | Admitting: Internal Medicine

## 2021-07-02 DIAGNOSIS — Z9181 History of falling: Secondary | ICD-10-CM | POA: Diagnosis not present

## 2021-07-02 DIAGNOSIS — S63275A Dislocation of unspecified interphalangeal joint of left ring finger, initial encounter: Secondary | ICD-10-CM | POA: Diagnosis not present

## 2021-07-02 DIAGNOSIS — R2681 Unsteadiness on feet: Secondary | ICD-10-CM | POA: Diagnosis not present

## 2021-07-02 DIAGNOSIS — R296 Repeated falls: Secondary | ICD-10-CM | POA: Diagnosis not present

## 2021-07-02 DIAGNOSIS — G20 Parkinson's disease: Secondary | ICD-10-CM | POA: Diagnosis not present

## 2021-07-02 DIAGNOSIS — M6281 Muscle weakness (generalized): Secondary | ICD-10-CM | POA: Diagnosis not present

## 2021-07-03 DIAGNOSIS — S63275A Dislocation of unspecified interphalangeal joint of left ring finger, initial encounter: Secondary | ICD-10-CM | POA: Diagnosis not present

## 2021-07-03 DIAGNOSIS — M6281 Muscle weakness (generalized): Secondary | ICD-10-CM | POA: Diagnosis not present

## 2021-07-03 DIAGNOSIS — Z9181 History of falling: Secondary | ICD-10-CM | POA: Diagnosis not present

## 2021-07-03 DIAGNOSIS — G20 Parkinson's disease: Secondary | ICD-10-CM | POA: Diagnosis not present

## 2021-07-03 DIAGNOSIS — R296 Repeated falls: Secondary | ICD-10-CM | POA: Diagnosis not present

## 2021-07-03 DIAGNOSIS — R2681 Unsteadiness on feet: Secondary | ICD-10-CM | POA: Diagnosis not present

## 2021-07-04 DIAGNOSIS — M6281 Muscle weakness (generalized): Secondary | ICD-10-CM | POA: Diagnosis not present

## 2021-07-04 DIAGNOSIS — Z9181 History of falling: Secondary | ICD-10-CM | POA: Diagnosis not present

## 2021-07-04 DIAGNOSIS — G20 Parkinson's disease: Secondary | ICD-10-CM | POA: Diagnosis not present

## 2021-07-04 DIAGNOSIS — R2681 Unsteadiness on feet: Secondary | ICD-10-CM | POA: Diagnosis not present

## 2021-07-04 DIAGNOSIS — R296 Repeated falls: Secondary | ICD-10-CM | POA: Diagnosis not present

## 2021-07-04 DIAGNOSIS — S63275A Dislocation of unspecified interphalangeal joint of left ring finger, initial encounter: Secondary | ICD-10-CM | POA: Diagnosis not present

## 2021-07-05 DIAGNOSIS — S63275A Dislocation of unspecified interphalangeal joint of left ring finger, initial encounter: Secondary | ICD-10-CM | POA: Diagnosis not present

## 2021-07-05 DIAGNOSIS — G20 Parkinson's disease: Secondary | ICD-10-CM | POA: Diagnosis not present

## 2021-07-05 DIAGNOSIS — M6281 Muscle weakness (generalized): Secondary | ICD-10-CM | POA: Diagnosis not present

## 2021-07-05 DIAGNOSIS — R296 Repeated falls: Secondary | ICD-10-CM | POA: Diagnosis not present

## 2021-07-05 DIAGNOSIS — Z9181 History of falling: Secondary | ICD-10-CM | POA: Diagnosis not present

## 2021-07-05 DIAGNOSIS — R2681 Unsteadiness on feet: Secondary | ICD-10-CM | POA: Diagnosis not present

## 2021-07-06 DIAGNOSIS — M6281 Muscle weakness (generalized): Secondary | ICD-10-CM | POA: Diagnosis not present

## 2021-07-06 DIAGNOSIS — G20 Parkinson's disease: Secondary | ICD-10-CM | POA: Diagnosis not present

## 2021-07-06 DIAGNOSIS — R296 Repeated falls: Secondary | ICD-10-CM | POA: Diagnosis not present

## 2021-07-06 DIAGNOSIS — S63275A Dislocation of unspecified interphalangeal joint of left ring finger, initial encounter: Secondary | ICD-10-CM | POA: Diagnosis not present

## 2021-07-06 DIAGNOSIS — R2681 Unsteadiness on feet: Secondary | ICD-10-CM | POA: Diagnosis not present

## 2021-07-06 DIAGNOSIS — Z9181 History of falling: Secondary | ICD-10-CM | POA: Diagnosis not present

## 2021-07-10 DIAGNOSIS — Z9181 History of falling: Secondary | ICD-10-CM | POA: Diagnosis not present

## 2021-07-10 DIAGNOSIS — S63275A Dislocation of unspecified interphalangeal joint of left ring finger, initial encounter: Secondary | ICD-10-CM | POA: Diagnosis not present

## 2021-07-10 DIAGNOSIS — M6281 Muscle weakness (generalized): Secondary | ICD-10-CM | POA: Diagnosis not present

## 2021-07-10 DIAGNOSIS — I10 Essential (primary) hypertension: Secondary | ICD-10-CM | POA: Diagnosis not present

## 2021-07-10 DIAGNOSIS — G20 Parkinson's disease: Secondary | ICD-10-CM | POA: Diagnosis not present

## 2021-07-10 DIAGNOSIS — R296 Repeated falls: Secondary | ICD-10-CM | POA: Diagnosis not present

## 2021-07-10 DIAGNOSIS — R946 Abnormal results of thyroid function studies: Secondary | ICD-10-CM | POA: Diagnosis not present

## 2021-07-10 DIAGNOSIS — R7309 Other abnormal glucose: Secondary | ICD-10-CM | POA: Diagnosis not present

## 2021-07-10 DIAGNOSIS — R2681 Unsteadiness on feet: Secondary | ICD-10-CM | POA: Diagnosis not present

## 2021-07-11 DIAGNOSIS — S63275A Dislocation of unspecified interphalangeal joint of left ring finger, initial encounter: Secondary | ICD-10-CM | POA: Diagnosis not present

## 2021-07-11 DIAGNOSIS — Z9181 History of falling: Secondary | ICD-10-CM | POA: Diagnosis not present

## 2021-07-11 DIAGNOSIS — M6281 Muscle weakness (generalized): Secondary | ICD-10-CM | POA: Diagnosis not present

## 2021-07-11 DIAGNOSIS — R2681 Unsteadiness on feet: Secondary | ICD-10-CM | POA: Diagnosis not present

## 2021-07-11 DIAGNOSIS — R296 Repeated falls: Secondary | ICD-10-CM | POA: Diagnosis not present

## 2021-07-11 DIAGNOSIS — G20 Parkinson's disease: Secondary | ICD-10-CM | POA: Diagnosis not present

## 2021-07-12 DIAGNOSIS — M6281 Muscle weakness (generalized): Secondary | ICD-10-CM | POA: Diagnosis not present

## 2021-07-12 DIAGNOSIS — G20 Parkinson's disease: Secondary | ICD-10-CM | POA: Diagnosis not present

## 2021-07-12 DIAGNOSIS — R296 Repeated falls: Secondary | ICD-10-CM | POA: Diagnosis not present

## 2021-07-12 DIAGNOSIS — S63275A Dislocation of unspecified interphalangeal joint of left ring finger, initial encounter: Secondary | ICD-10-CM | POA: Diagnosis not present

## 2021-07-12 DIAGNOSIS — R2681 Unsteadiness on feet: Secondary | ICD-10-CM | POA: Diagnosis not present

## 2021-07-12 DIAGNOSIS — Z9181 History of falling: Secondary | ICD-10-CM | POA: Diagnosis not present

## 2021-07-20 DIAGNOSIS — I480 Paroxysmal atrial fibrillation: Secondary | ICD-10-CM | POA: Diagnosis not present

## 2021-07-20 DIAGNOSIS — E039 Hypothyroidism, unspecified: Secondary | ICD-10-CM | POA: Diagnosis not present

## 2021-07-20 DIAGNOSIS — Z7984 Long term (current) use of oral hypoglycemic drugs: Secondary | ICD-10-CM | POA: Diagnosis not present

## 2021-07-20 DIAGNOSIS — R296 Repeated falls: Secondary | ICD-10-CM | POA: Diagnosis not present

## 2021-07-20 DIAGNOSIS — E119 Type 2 diabetes mellitus without complications: Secondary | ICD-10-CM | POA: Diagnosis not present

## 2021-07-20 DIAGNOSIS — N400 Enlarged prostate without lower urinary tract symptoms: Secondary | ICD-10-CM | POA: Diagnosis not present

## 2021-07-20 DIAGNOSIS — G20 Parkinson's disease: Secondary | ICD-10-CM | POA: Diagnosis not present

## 2021-07-20 DIAGNOSIS — F418 Other specified anxiety disorders: Secondary | ICD-10-CM | POA: Diagnosis not present

## 2021-07-24 DIAGNOSIS — L821 Other seborrheic keratosis: Secondary | ICD-10-CM | POA: Diagnosis not present

## 2021-07-24 DIAGNOSIS — L570 Actinic keratosis: Secondary | ICD-10-CM | POA: Diagnosis not present

## 2021-07-24 DIAGNOSIS — L814 Other melanin hyperpigmentation: Secondary | ICD-10-CM | POA: Diagnosis not present

## 2021-07-24 DIAGNOSIS — Z85828 Personal history of other malignant neoplasm of skin: Secondary | ICD-10-CM | POA: Diagnosis not present

## 2021-08-21 DIAGNOSIS — L570 Actinic keratosis: Secondary | ICD-10-CM | POA: Diagnosis not present

## 2021-08-21 DIAGNOSIS — Z85828 Personal history of other malignant neoplasm of skin: Secondary | ICD-10-CM | POA: Diagnosis not present

## 2021-08-22 DIAGNOSIS — N400 Enlarged prostate without lower urinary tract symptoms: Secondary | ICD-10-CM | POA: Diagnosis not present

## 2021-08-22 DIAGNOSIS — I10 Essential (primary) hypertension: Secondary | ICD-10-CM | POA: Diagnosis not present

## 2021-08-22 DIAGNOSIS — R296 Repeated falls: Secondary | ICD-10-CM | POA: Diagnosis not present

## 2021-08-22 DIAGNOSIS — G20 Parkinson's disease: Secondary | ICD-10-CM | POA: Diagnosis not present

## 2021-08-22 DIAGNOSIS — E785 Hyperlipidemia, unspecified: Secondary | ICD-10-CM | POA: Diagnosis not present

## 2021-08-22 DIAGNOSIS — F418 Other specified anxiety disorders: Secondary | ICD-10-CM | POA: Diagnosis not present

## 2021-08-22 DIAGNOSIS — E039 Hypothyroidism, unspecified: Secondary | ICD-10-CM | POA: Diagnosis not present

## 2021-08-22 DIAGNOSIS — Z7984 Long term (current) use of oral hypoglycemic drugs: Secondary | ICD-10-CM | POA: Diagnosis not present

## 2021-08-22 DIAGNOSIS — I480 Paroxysmal atrial fibrillation: Secondary | ICD-10-CM | POA: Diagnosis not present

## 2021-08-22 DIAGNOSIS — E119 Type 2 diabetes mellitus without complications: Secondary | ICD-10-CM | POA: Diagnosis not present

## 2021-08-27 DIAGNOSIS — R110 Nausea: Secondary | ICD-10-CM | POA: Diagnosis not present

## 2021-08-27 DIAGNOSIS — R197 Diarrhea, unspecified: Secondary | ICD-10-CM | POA: Diagnosis not present

## 2021-08-28 DIAGNOSIS — F331 Major depressive disorder, recurrent, moderate: Secondary | ICD-10-CM | POA: Diagnosis not present

## 2021-08-28 DIAGNOSIS — F411 Generalized anxiety disorder: Secondary | ICD-10-CM | POA: Diagnosis not present

## 2021-08-28 DIAGNOSIS — F4323 Adjustment disorder with mixed anxiety and depressed mood: Secondary | ICD-10-CM | POA: Diagnosis not present

## 2021-08-28 NOTE — Progress Notes (Deleted)
Assessment/Plan:   1.  Parkinsons Disease  -continue carbidopa/levodopa 25/100, 2 tablets 3 times per day.  Currently taking it with protein.  Discussed interaction with protein.  -Has some dyskinesia, but not that bothersome and does not require medication.  He and I discussed nature of dyskinesia today.  Patient does not even notice the dyskinesia.  -Patient is now in SNF/full time care   2.  Diabetic peripheral neuropathy  -Off of gabapentin because of confusion/falls  3.  Depression  -On sertraline, 100 mg daily.  Used to be on 100 mg bid.  Unclear why changed  -pcp managing  4.  Visual hallucinations  -This really only happens with his wife, and I suspect that this is depression related psychosis as opposed to Parkinson's related psychosis.   -The caregiver states that she has only been back with him this week, and there was another caregiver there previously.  That company just came back into the patient's care.  I asked her to watch this, and if she notes consistent hallucinations (not just associated with the wife) to call me and we can potentially start Nuplazid.  Subjective:   Stephen Robbins was seen today in follow up for Parkinsons disease.  My previous records were reviewed prior to todays visit as well as outside records available to me. Pt with caregiver who supplements hx. I last saw the patient in December.  A sooner appointment was requested by patient and primary care doctor.  We called the patient many, many times for sooner appointments but those appointments were either declined or we could not get a hold of the patient.  Records from nursing facility are reviewed. In February, we stopped his gabapentin because of multiple falls, and I recommended that he likely needed full-time SNF care.  He now has that.  Therapies are completed per last notes.  Patient is frustrated that he can no longer live independently.    Current movement disorder  medications: Carbidopa/levodopa 25/100, 2 tablets 3 times per day (states taking breakfast, lunch dinner) Gabapentin, 300 mg twice per day Sertraline, 100 mg daily   ALLERGIES:   Allergies  Allergen Reactions   Covid-19 Mrna Vacc (Moderna)     "Couldn't move". No rash, no itching    Penicillins Other (See Comments)    Unknown childhood reaction    CURRENT MEDICATIONS:  Outpatient Encounter Medications as of 08/30/2021  Medication Sig   blood glucose meter kit and supplies Dispense based on patient and insurance preference. Check blood sugars daily and as needed Dx: e11.9   ACCU-CHEK FASTCLIX LANCETS MISC Check blood sugar three times daily (Patient not taking: Reported on 11/15/2020)   bisacodyl (DULCOLAX) 5 MG EC tablet Take 5 mg daily as needed by mouth for moderate constipation. (Patient not taking: No sig reported)   Blood Pressure KIT Take blood pressure twice weekly.   buPROPion (WELLBUTRIN) 75 MG tablet TAKE 1 TABLET(75 MG) BY MOUTH TWICE DAILY   carbidopa-levodopa (SINEMET IR) 25-100 MG tablet Take 2 tablets by mouth 3 (three) times daily.   FARXIGA 10 MG TABS tablet TAKE 1 TABLET(10 MG) BY MOUTH DAILY   gabapentin (NEURONTIN) 300 MG capsule TAKE 1 CAPSULE(300 MG) BY MOUTH TWICE DAILY   GEMTESA 75 MG TABS Take 1 tablet by mouth daily.   levothyroxine (SYNTHROID) 75 MCG tablet TAKE 1 TABLET(75 MCG) BY MOUTH DAILY BEFORE AND BREAKFAST   metFORMIN (GLUCOPHAGE) 1000 MG tablet TAKE 1 TABLET(1000 MG) BY MOUTH TWICE DAILY WITH A MEAL  polyethylene glycol powder (MIRALAX) powder Take 17 g daily by mouth.   rivaroxaban (XARELTO) 20 MG TABS tablet Take 1 tablet (20 mg total) by mouth daily with supper. NEEDS CARDIOLOGY APPOINTMENT, PLEASE CALL OFFICE   sertraline (ZOLOFT) 100 MG tablet Take 1 tablet (100 mg total) by mouth daily.   simvastatin (ZOCOR) 40 MG tablet Take 1 tablet (40 mg total) by mouth at bedtime.   tamsulosin (FLOMAX) 0.4 MG CAPS capsule Take 0.4 mg by mouth daily.    vitamin B-12 (CYANOCOBALAMIN) 1000 MCG tablet Take 1,000 mcg by mouth every morning.    VITAMIN D, ERGOCALCIFEROL, PO Take by mouth.   No facility-administered encounter medications on file as of 08/30/2021.    Objective:   PHYSICAL EXAMINATION:    VITALS:   There were no vitals filed for this visit.   Wt Readings from Last 3 Encounters:  03/19/21 147 lb 14.9 oz (67.1 kg)  02/27/21 148 lb (67.1 kg)  11/20/20 150 lb (68 kg)     GEN:  The patient appears stated age and is in NAD.  He appears frail HEENT:  Normocephalic, atraumatic.  Right eye is just slightly irritated, as he just had cataract surgery.  The mucous membranes are moist. The superficial temporal arteries are without ropiness or tenderness. CV:  RRR Lungs:  CTAB Neck/HEME:  There are no carotid bruits bilaterally.  Neurological examination:  Orientation: The patient is alert and oriented x3.  He asks appropriate questions (remembers examiner's daughters and asks about them). Cranial nerves: There is good facial symmetry with facial hypomimia. The speech is fluent and clear but hypophonic. Soft palate rises symmetrically and there is no tongue deviation. Hearing is intact to conversational tone. Sensation: Sensation is intact to light touch throughout Motor: Strength is at least antigravity x4.  Movement examination: Tone: There is nl tone in the UE/LE Abnormal movements: there is no tremor today, but he does have right arm/shoulder dyskinesia Coordination:  There is no decremation with RAM's Gait and Station: pt is short stepped and shuffling without the walker.  He is better with the walker.  I have reviewed and interpreted the following labs independently    Chemistry      Component Value Date/Time   NA 137 11/20/2020 1956   K 3.6 11/20/2020 1956   CL 103 11/20/2020 1956   CO2 26 11/20/2020 1956   BUN 16 11/20/2020 1956   CREATININE 0.73 11/20/2020 1956   CREATININE 0.93 12/07/2019 1520      Component  Value Date/Time   CALCIUM 9.4 11/20/2020 1956   ALKPHOS 48 10/17/2020 1456   AST 18 10/17/2020 1456   ALT 10 10/17/2020 1456   BILITOT 0.6 10/17/2020 1456       Lab Results  Component Value Date   WBC 6.3 11/20/2020   HGB 14.6 11/20/2020   HCT 41.0 11/20/2020   MCV 96.5 11/20/2020   PLT 208 11/20/2020    Lab Results  Component Value Date   TSH 3.879 11/21/2020     Total time spent on today's visit was 30 minutes, including both face-to-face time and nonface-to-face time.  Time included that spent on review of records (prior notes available to me/labs/imaging if pertinent), discussing treatment and goals, answering patient's questions and coordinating care.  Cc:  Javier Glazier, MD

## 2021-08-30 ENCOUNTER — Ambulatory Visit: Payer: Medicare Other | Admitting: Neurology

## 2021-08-30 ENCOUNTER — Encounter: Payer: Self-pay | Admitting: Neurology

## 2021-08-30 DIAGNOSIS — Z029 Encounter for administrative examinations, unspecified: Secondary | ICD-10-CM

## 2021-09-19 DIAGNOSIS — I10 Essential (primary) hypertension: Secondary | ICD-10-CM | POA: Diagnosis not present

## 2021-10-04 DIAGNOSIS — L570 Actinic keratosis: Secondary | ICD-10-CM | POA: Diagnosis not present

## 2021-10-04 DIAGNOSIS — Z85828 Personal history of other malignant neoplasm of skin: Secondary | ICD-10-CM | POA: Diagnosis not present

## 2021-10-04 DIAGNOSIS — L821 Other seborrheic keratosis: Secondary | ICD-10-CM | POA: Diagnosis not present

## 2021-10-04 DIAGNOSIS — D1801 Hemangioma of skin and subcutaneous tissue: Secondary | ICD-10-CM | POA: Diagnosis not present

## 2021-10-04 DIAGNOSIS — L814 Other melanin hyperpigmentation: Secondary | ICD-10-CM | POA: Diagnosis not present

## 2021-10-12 DIAGNOSIS — H5703 Miosis: Secondary | ICD-10-CM | POA: Diagnosis not present

## 2021-10-12 DIAGNOSIS — E119 Type 2 diabetes mellitus without complications: Secondary | ICD-10-CM | POA: Diagnosis not present

## 2021-10-12 DIAGNOSIS — H18523 Epithelial (juvenile) corneal dystrophy, bilateral: Secondary | ICD-10-CM | POA: Diagnosis not present

## 2021-10-12 DIAGNOSIS — H40003 Preglaucoma, unspecified, bilateral: Secondary | ICD-10-CM | POA: Diagnosis not present

## 2021-10-12 DIAGNOSIS — Z7984 Long term (current) use of oral hypoglycemic drugs: Secondary | ICD-10-CM | POA: Diagnosis not present

## 2021-10-12 DIAGNOSIS — Z961 Presence of intraocular lens: Secondary | ICD-10-CM | POA: Diagnosis not present

## 2021-10-12 DIAGNOSIS — G20 Parkinson's disease: Secondary | ICD-10-CM | POA: Diagnosis not present

## 2021-10-12 DIAGNOSIS — H04123 Dry eye syndrome of bilateral lacrimal glands: Secondary | ICD-10-CM | POA: Diagnosis not present

## 2021-10-17 DIAGNOSIS — G20 Parkinson's disease: Secondary | ICD-10-CM | POA: Diagnosis not present

## 2021-10-17 DIAGNOSIS — W19XXXD Unspecified fall, subsequent encounter: Secondary | ICD-10-CM | POA: Diagnosis not present

## 2021-10-17 DIAGNOSIS — R2681 Unsteadiness on feet: Secondary | ICD-10-CM | POA: Diagnosis not present

## 2021-10-17 DIAGNOSIS — R2689 Other abnormalities of gait and mobility: Secondary | ICD-10-CM | POA: Diagnosis not present

## 2021-10-17 DIAGNOSIS — R278 Other lack of coordination: Secondary | ICD-10-CM | POA: Diagnosis not present

## 2021-10-17 DIAGNOSIS — M545 Low back pain, unspecified: Secondary | ICD-10-CM | POA: Diagnosis not present

## 2021-10-18 DIAGNOSIS — M5459 Other low back pain: Secondary | ICD-10-CM | POA: Diagnosis not present

## 2021-10-18 DIAGNOSIS — M5136 Other intervertebral disc degeneration, lumbar region: Secondary | ICD-10-CM | POA: Diagnosis not present

## 2021-10-19 DIAGNOSIS — M545 Low back pain, unspecified: Secondary | ICD-10-CM | POA: Diagnosis not present

## 2021-10-19 DIAGNOSIS — R278 Other lack of coordination: Secondary | ICD-10-CM | POA: Diagnosis not present

## 2021-10-19 DIAGNOSIS — E039 Hypothyroidism, unspecified: Secondary | ICD-10-CM | POA: Diagnosis not present

## 2021-10-19 DIAGNOSIS — N3281 Overactive bladder: Secondary | ICD-10-CM | POA: Diagnosis not present

## 2021-10-19 DIAGNOSIS — F419 Anxiety disorder, unspecified: Secondary | ICD-10-CM | POA: Diagnosis not present

## 2021-10-19 DIAGNOSIS — I10 Essential (primary) hypertension: Secondary | ICD-10-CM | POA: Diagnosis not present

## 2021-10-19 DIAGNOSIS — Z7902 Long term (current) use of antithrombotics/antiplatelets: Secondary | ICD-10-CM | POA: Diagnosis not present

## 2021-10-19 DIAGNOSIS — R2689 Other abnormalities of gait and mobility: Secondary | ICD-10-CM | POA: Diagnosis not present

## 2021-10-19 DIAGNOSIS — W19XXXD Unspecified fall, subsequent encounter: Secondary | ICD-10-CM | POA: Diagnosis not present

## 2021-10-19 DIAGNOSIS — R2681 Unsteadiness on feet: Secondary | ICD-10-CM | POA: Diagnosis not present

## 2021-10-19 DIAGNOSIS — R296 Repeated falls: Secondary | ICD-10-CM | POA: Diagnosis not present

## 2021-10-19 DIAGNOSIS — E119 Type 2 diabetes mellitus without complications: Secondary | ICD-10-CM | POA: Diagnosis not present

## 2021-10-19 DIAGNOSIS — I4891 Unspecified atrial fibrillation: Secondary | ICD-10-CM | POA: Diagnosis not present

## 2021-10-19 DIAGNOSIS — E785 Hyperlipidemia, unspecified: Secondary | ICD-10-CM | POA: Diagnosis not present

## 2021-10-19 DIAGNOSIS — G20 Parkinson's disease: Secondary | ICD-10-CM | POA: Diagnosis not present

## 2021-10-19 DIAGNOSIS — Z7984 Long term (current) use of oral hypoglycemic drugs: Secondary | ICD-10-CM | POA: Diagnosis not present

## 2021-10-19 DIAGNOSIS — D649 Anemia, unspecified: Secondary | ICD-10-CM | POA: Diagnosis not present

## 2021-10-19 DIAGNOSIS — F32A Depression, unspecified: Secondary | ICD-10-CM | POA: Diagnosis not present

## 2021-10-23 DIAGNOSIS — W19XXXD Unspecified fall, subsequent encounter: Secondary | ICD-10-CM | POA: Diagnosis not present

## 2021-10-23 DIAGNOSIS — G20 Parkinson's disease: Secondary | ICD-10-CM | POA: Diagnosis not present

## 2021-10-23 DIAGNOSIS — R2689 Other abnormalities of gait and mobility: Secondary | ICD-10-CM | POA: Diagnosis not present

## 2021-10-23 DIAGNOSIS — R1312 Dysphagia, oropharyngeal phase: Secondary | ICD-10-CM | POA: Diagnosis not present

## 2021-10-23 DIAGNOSIS — M545 Low back pain, unspecified: Secondary | ICD-10-CM | POA: Diagnosis not present

## 2021-10-23 DIAGNOSIS — R499 Unspecified voice and resonance disorder: Secondary | ICD-10-CM | POA: Diagnosis not present

## 2021-10-23 DIAGNOSIS — R41841 Cognitive communication deficit: Secondary | ICD-10-CM | POA: Diagnosis not present

## 2021-10-23 DIAGNOSIS — R2681 Unsteadiness on feet: Secondary | ICD-10-CM | POA: Diagnosis not present

## 2021-10-23 DIAGNOSIS — R278 Other lack of coordination: Secondary | ICD-10-CM | POA: Diagnosis not present

## 2021-10-25 DIAGNOSIS — R2689 Other abnormalities of gait and mobility: Secondary | ICD-10-CM | POA: Diagnosis not present

## 2021-10-25 DIAGNOSIS — R2681 Unsteadiness on feet: Secondary | ICD-10-CM | POA: Diagnosis not present

## 2021-10-25 DIAGNOSIS — W19XXXD Unspecified fall, subsequent encounter: Secondary | ICD-10-CM | POA: Diagnosis not present

## 2021-10-25 DIAGNOSIS — R278 Other lack of coordination: Secondary | ICD-10-CM | POA: Diagnosis not present

## 2021-10-25 DIAGNOSIS — G20 Parkinson's disease: Secondary | ICD-10-CM | POA: Diagnosis not present

## 2021-10-25 DIAGNOSIS — M545 Low back pain, unspecified: Secondary | ICD-10-CM | POA: Diagnosis not present

## 2021-10-26 DIAGNOSIS — R2681 Unsteadiness on feet: Secondary | ICD-10-CM | POA: Diagnosis not present

## 2021-10-26 DIAGNOSIS — M545 Low back pain, unspecified: Secondary | ICD-10-CM | POA: Diagnosis not present

## 2021-10-26 DIAGNOSIS — R2689 Other abnormalities of gait and mobility: Secondary | ICD-10-CM | POA: Diagnosis not present

## 2021-10-26 DIAGNOSIS — R278 Other lack of coordination: Secondary | ICD-10-CM | POA: Diagnosis not present

## 2021-10-26 DIAGNOSIS — W19XXXD Unspecified fall, subsequent encounter: Secondary | ICD-10-CM | POA: Diagnosis not present

## 2021-10-26 DIAGNOSIS — G20 Parkinson's disease: Secondary | ICD-10-CM | POA: Diagnosis not present

## 2021-10-29 DIAGNOSIS — M545 Low back pain, unspecified: Secondary | ICD-10-CM | POA: Diagnosis not present

## 2021-10-29 DIAGNOSIS — R2681 Unsteadiness on feet: Secondary | ICD-10-CM | POA: Diagnosis not present

## 2021-10-29 DIAGNOSIS — G20 Parkinson's disease: Secondary | ICD-10-CM | POA: Diagnosis not present

## 2021-10-29 DIAGNOSIS — R2689 Other abnormalities of gait and mobility: Secondary | ICD-10-CM | POA: Diagnosis not present

## 2021-10-29 DIAGNOSIS — R278 Other lack of coordination: Secondary | ICD-10-CM | POA: Diagnosis not present

## 2021-10-29 DIAGNOSIS — W19XXXD Unspecified fall, subsequent encounter: Secondary | ICD-10-CM | POA: Diagnosis not present

## 2021-10-31 DIAGNOSIS — R2689 Other abnormalities of gait and mobility: Secondary | ICD-10-CM | POA: Diagnosis not present

## 2021-10-31 DIAGNOSIS — R278 Other lack of coordination: Secondary | ICD-10-CM | POA: Diagnosis not present

## 2021-10-31 DIAGNOSIS — M545 Low back pain, unspecified: Secondary | ICD-10-CM | POA: Diagnosis not present

## 2021-10-31 DIAGNOSIS — R2681 Unsteadiness on feet: Secondary | ICD-10-CM | POA: Diagnosis not present

## 2021-10-31 DIAGNOSIS — W19XXXD Unspecified fall, subsequent encounter: Secondary | ICD-10-CM | POA: Diagnosis not present

## 2021-10-31 DIAGNOSIS — G20 Parkinson's disease: Secondary | ICD-10-CM | POA: Diagnosis not present

## 2021-11-01 DIAGNOSIS — M545 Low back pain, unspecified: Secondary | ICD-10-CM | POA: Diagnosis not present

## 2021-11-01 DIAGNOSIS — R278 Other lack of coordination: Secondary | ICD-10-CM | POA: Diagnosis not present

## 2021-11-01 DIAGNOSIS — R2681 Unsteadiness on feet: Secondary | ICD-10-CM | POA: Diagnosis not present

## 2021-11-01 DIAGNOSIS — W19XXXD Unspecified fall, subsequent encounter: Secondary | ICD-10-CM | POA: Diagnosis not present

## 2021-11-01 DIAGNOSIS — G20 Parkinson's disease: Secondary | ICD-10-CM | POA: Diagnosis not present

## 2021-11-01 DIAGNOSIS — R2689 Other abnormalities of gait and mobility: Secondary | ICD-10-CM | POA: Diagnosis not present

## 2021-11-06 DIAGNOSIS — M545 Low back pain, unspecified: Secondary | ICD-10-CM | POA: Diagnosis not present

## 2021-11-06 DIAGNOSIS — R278 Other lack of coordination: Secondary | ICD-10-CM | POA: Diagnosis not present

## 2021-11-06 DIAGNOSIS — G20 Parkinson's disease: Secondary | ICD-10-CM | POA: Diagnosis not present

## 2021-11-06 DIAGNOSIS — R2689 Other abnormalities of gait and mobility: Secondary | ICD-10-CM | POA: Diagnosis not present

## 2021-11-06 DIAGNOSIS — W19XXXD Unspecified fall, subsequent encounter: Secondary | ICD-10-CM | POA: Diagnosis not present

## 2021-11-06 DIAGNOSIS — R2681 Unsteadiness on feet: Secondary | ICD-10-CM | POA: Diagnosis not present

## 2021-11-07 DIAGNOSIS — R2681 Unsteadiness on feet: Secondary | ICD-10-CM | POA: Diagnosis not present

## 2021-11-07 DIAGNOSIS — R2689 Other abnormalities of gait and mobility: Secondary | ICD-10-CM | POA: Diagnosis not present

## 2021-11-07 DIAGNOSIS — G20 Parkinson's disease: Secondary | ICD-10-CM | POA: Diagnosis not present

## 2021-11-07 DIAGNOSIS — M545 Low back pain, unspecified: Secondary | ICD-10-CM | POA: Diagnosis not present

## 2021-11-07 DIAGNOSIS — W19XXXD Unspecified fall, subsequent encounter: Secondary | ICD-10-CM | POA: Diagnosis not present

## 2021-11-07 DIAGNOSIS — R278 Other lack of coordination: Secondary | ICD-10-CM | POA: Diagnosis not present

## 2021-11-09 DIAGNOSIS — R2689 Other abnormalities of gait and mobility: Secondary | ICD-10-CM | POA: Diagnosis not present

## 2021-11-09 DIAGNOSIS — R278 Other lack of coordination: Secondary | ICD-10-CM | POA: Diagnosis not present

## 2021-11-09 DIAGNOSIS — G20 Parkinson's disease: Secondary | ICD-10-CM | POA: Diagnosis not present

## 2021-11-09 DIAGNOSIS — W19XXXD Unspecified fall, subsequent encounter: Secondary | ICD-10-CM | POA: Diagnosis not present

## 2021-11-09 DIAGNOSIS — R2681 Unsteadiness on feet: Secondary | ICD-10-CM | POA: Diagnosis not present

## 2021-11-09 DIAGNOSIS — M545 Low back pain, unspecified: Secondary | ICD-10-CM | POA: Diagnosis not present

## 2021-11-12 DIAGNOSIS — R2681 Unsteadiness on feet: Secondary | ICD-10-CM | POA: Diagnosis not present

## 2021-11-12 DIAGNOSIS — G20 Parkinson's disease: Secondary | ICD-10-CM | POA: Diagnosis not present

## 2021-11-12 DIAGNOSIS — R2689 Other abnormalities of gait and mobility: Secondary | ICD-10-CM | POA: Diagnosis not present

## 2021-11-12 DIAGNOSIS — R278 Other lack of coordination: Secondary | ICD-10-CM | POA: Diagnosis not present

## 2021-11-12 DIAGNOSIS — W19XXXD Unspecified fall, subsequent encounter: Secondary | ICD-10-CM | POA: Diagnosis not present

## 2021-11-12 DIAGNOSIS — M545 Low back pain, unspecified: Secondary | ICD-10-CM | POA: Diagnosis not present

## 2021-11-13 DIAGNOSIS — G20 Parkinson's disease: Secondary | ICD-10-CM | POA: Diagnosis not present

## 2021-11-13 DIAGNOSIS — R2689 Other abnormalities of gait and mobility: Secondary | ICD-10-CM | POA: Diagnosis not present

## 2021-11-13 DIAGNOSIS — W19XXXD Unspecified fall, subsequent encounter: Secondary | ICD-10-CM | POA: Diagnosis not present

## 2021-11-13 DIAGNOSIS — R278 Other lack of coordination: Secondary | ICD-10-CM | POA: Diagnosis not present

## 2021-11-13 DIAGNOSIS — R2681 Unsteadiness on feet: Secondary | ICD-10-CM | POA: Diagnosis not present

## 2021-11-13 DIAGNOSIS — M545 Low back pain, unspecified: Secondary | ICD-10-CM | POA: Diagnosis not present

## 2021-11-14 DIAGNOSIS — W19XXXD Unspecified fall, subsequent encounter: Secondary | ICD-10-CM | POA: Diagnosis not present

## 2021-11-14 DIAGNOSIS — R278 Other lack of coordination: Secondary | ICD-10-CM | POA: Diagnosis not present

## 2021-11-14 DIAGNOSIS — R2689 Other abnormalities of gait and mobility: Secondary | ICD-10-CM | POA: Diagnosis not present

## 2021-11-14 DIAGNOSIS — M545 Low back pain, unspecified: Secondary | ICD-10-CM | POA: Diagnosis not present

## 2021-11-14 DIAGNOSIS — R2681 Unsteadiness on feet: Secondary | ICD-10-CM | POA: Diagnosis not present

## 2021-11-14 DIAGNOSIS — G20 Parkinson's disease: Secondary | ICD-10-CM | POA: Diagnosis not present

## 2021-11-16 DIAGNOSIS — R2689 Other abnormalities of gait and mobility: Secondary | ICD-10-CM | POA: Diagnosis not present

## 2021-11-16 DIAGNOSIS — R2681 Unsteadiness on feet: Secondary | ICD-10-CM | POA: Diagnosis not present

## 2021-11-16 DIAGNOSIS — M545 Low back pain, unspecified: Secondary | ICD-10-CM | POA: Diagnosis not present

## 2021-11-16 DIAGNOSIS — G20 Parkinson's disease: Secondary | ICD-10-CM | POA: Diagnosis not present

## 2021-11-16 DIAGNOSIS — R278 Other lack of coordination: Secondary | ICD-10-CM | POA: Diagnosis not present

## 2021-11-16 DIAGNOSIS — W19XXXD Unspecified fall, subsequent encounter: Secondary | ICD-10-CM | POA: Diagnosis not present

## 2021-11-19 DIAGNOSIS — G20 Parkinson's disease: Secondary | ICD-10-CM | POA: Diagnosis not present

## 2021-11-19 DIAGNOSIS — R278 Other lack of coordination: Secondary | ICD-10-CM | POA: Diagnosis not present

## 2021-11-19 DIAGNOSIS — W19XXXD Unspecified fall, subsequent encounter: Secondary | ICD-10-CM | POA: Diagnosis not present

## 2021-11-19 DIAGNOSIS — R2689 Other abnormalities of gait and mobility: Secondary | ICD-10-CM | POA: Diagnosis not present

## 2021-11-19 DIAGNOSIS — M545 Low back pain, unspecified: Secondary | ICD-10-CM | POA: Diagnosis not present

## 2021-11-19 DIAGNOSIS — R2681 Unsteadiness on feet: Secondary | ICD-10-CM | POA: Diagnosis not present

## 2021-11-20 DIAGNOSIS — R2689 Other abnormalities of gait and mobility: Secondary | ICD-10-CM | POA: Diagnosis not present

## 2021-11-20 DIAGNOSIS — R278 Other lack of coordination: Secondary | ICD-10-CM | POA: Diagnosis not present

## 2021-11-20 DIAGNOSIS — G20 Parkinson's disease: Secondary | ICD-10-CM | POA: Diagnosis not present

## 2021-11-20 DIAGNOSIS — R2681 Unsteadiness on feet: Secondary | ICD-10-CM | POA: Diagnosis not present

## 2021-11-20 DIAGNOSIS — W19XXXD Unspecified fall, subsequent encounter: Secondary | ICD-10-CM | POA: Diagnosis not present

## 2021-11-20 DIAGNOSIS — M545 Low back pain, unspecified: Secondary | ICD-10-CM | POA: Diagnosis not present

## 2021-11-21 DIAGNOSIS — G20 Parkinson's disease: Secondary | ICD-10-CM | POA: Diagnosis not present

## 2021-11-21 DIAGNOSIS — D649 Anemia, unspecified: Secondary | ICD-10-CM | POA: Diagnosis not present

## 2021-11-21 DIAGNOSIS — R2689 Other abnormalities of gait and mobility: Secondary | ICD-10-CM | POA: Diagnosis not present

## 2021-11-21 DIAGNOSIS — M545 Low back pain, unspecified: Secondary | ICD-10-CM | POA: Diagnosis not present

## 2021-11-21 DIAGNOSIS — W19XXXD Unspecified fall, subsequent encounter: Secondary | ICD-10-CM | POA: Diagnosis not present

## 2021-11-21 DIAGNOSIS — E785 Hyperlipidemia, unspecified: Secondary | ICD-10-CM | POA: Diagnosis not present

## 2021-11-21 DIAGNOSIS — R2681 Unsteadiness on feet: Secondary | ICD-10-CM | POA: Diagnosis not present

## 2021-11-21 DIAGNOSIS — Z13220 Encounter for screening for lipoid disorders: Secondary | ICD-10-CM | POA: Diagnosis not present

## 2021-11-21 DIAGNOSIS — R278 Other lack of coordination: Secondary | ICD-10-CM | POA: Diagnosis not present

## 2021-11-22 DIAGNOSIS — W19XXXD Unspecified fall, subsequent encounter: Secondary | ICD-10-CM | POA: Diagnosis not present

## 2021-11-22 DIAGNOSIS — M545 Low back pain, unspecified: Secondary | ICD-10-CM | POA: Diagnosis not present

## 2021-11-22 DIAGNOSIS — R278 Other lack of coordination: Secondary | ICD-10-CM | POA: Diagnosis not present

## 2021-11-22 DIAGNOSIS — G20 Parkinson's disease: Secondary | ICD-10-CM | POA: Diagnosis not present

## 2021-11-22 DIAGNOSIS — R2681 Unsteadiness on feet: Secondary | ICD-10-CM | POA: Diagnosis not present

## 2021-11-22 DIAGNOSIS — R2689 Other abnormalities of gait and mobility: Secondary | ICD-10-CM | POA: Diagnosis not present

## 2021-11-22 DIAGNOSIS — R197 Diarrhea, unspecified: Secondary | ICD-10-CM | POA: Diagnosis not present

## 2021-11-27 DIAGNOSIS — M545 Low back pain, unspecified: Secondary | ICD-10-CM | POA: Diagnosis not present

## 2021-11-27 DIAGNOSIS — R2689 Other abnormalities of gait and mobility: Secondary | ICD-10-CM | POA: Diagnosis not present

## 2021-11-27 DIAGNOSIS — R41841 Cognitive communication deficit: Secondary | ICD-10-CM | POA: Diagnosis not present

## 2021-11-27 DIAGNOSIS — R1312 Dysphagia, oropharyngeal phase: Secondary | ICD-10-CM | POA: Diagnosis not present

## 2021-11-27 DIAGNOSIS — R499 Unspecified voice and resonance disorder: Secondary | ICD-10-CM | POA: Diagnosis not present

## 2021-11-27 DIAGNOSIS — R2681 Unsteadiness on feet: Secondary | ICD-10-CM | POA: Diagnosis not present

## 2021-11-27 DIAGNOSIS — G20 Parkinson's disease: Secondary | ICD-10-CM | POA: Diagnosis not present

## 2021-11-27 DIAGNOSIS — R278 Other lack of coordination: Secondary | ICD-10-CM | POA: Diagnosis not present

## 2021-11-27 DIAGNOSIS — W19XXXD Unspecified fall, subsequent encounter: Secondary | ICD-10-CM | POA: Diagnosis not present

## 2021-11-28 DIAGNOSIS — I480 Paroxysmal atrial fibrillation: Secondary | ICD-10-CM | POA: Diagnosis not present

## 2021-11-28 DIAGNOSIS — W19XXXD Unspecified fall, subsequent encounter: Secondary | ICD-10-CM | POA: Diagnosis not present

## 2021-11-28 DIAGNOSIS — D649 Anemia, unspecified: Secondary | ICD-10-CM | POA: Diagnosis not present

## 2021-11-28 DIAGNOSIS — R197 Diarrhea, unspecified: Secondary | ICD-10-CM | POA: Diagnosis not present

## 2021-11-28 DIAGNOSIS — F32A Depression, unspecified: Secondary | ICD-10-CM | POA: Diagnosis not present

## 2021-11-28 DIAGNOSIS — F419 Anxiety disorder, unspecified: Secondary | ICD-10-CM | POA: Diagnosis not present

## 2021-11-28 DIAGNOSIS — E785 Hyperlipidemia, unspecified: Secondary | ICD-10-CM | POA: Diagnosis not present

## 2021-11-28 DIAGNOSIS — Z7984 Long term (current) use of oral hypoglycemic drugs: Secondary | ICD-10-CM | POA: Diagnosis not present

## 2021-11-28 DIAGNOSIS — E119 Type 2 diabetes mellitus without complications: Secondary | ICD-10-CM | POA: Diagnosis not present

## 2021-11-28 DIAGNOSIS — N400 Enlarged prostate without lower urinary tract symptoms: Secondary | ICD-10-CM | POA: Diagnosis not present

## 2021-11-28 DIAGNOSIS — R140 Abdominal distension (gaseous): Secondary | ICD-10-CM | POA: Diagnosis not present

## 2021-11-28 DIAGNOSIS — R296 Repeated falls: Secondary | ICD-10-CM | POA: Diagnosis not present

## 2021-11-28 DIAGNOSIS — R2681 Unsteadiness on feet: Secondary | ICD-10-CM | POA: Diagnosis not present

## 2021-11-28 DIAGNOSIS — G20 Parkinson's disease: Secondary | ICD-10-CM | POA: Diagnosis not present

## 2021-11-28 DIAGNOSIS — R2689 Other abnormalities of gait and mobility: Secondary | ICD-10-CM | POA: Diagnosis not present

## 2021-11-28 DIAGNOSIS — E039 Hypothyroidism, unspecified: Secondary | ICD-10-CM | POA: Diagnosis not present

## 2021-11-28 DIAGNOSIS — M545 Low back pain, unspecified: Secondary | ICD-10-CM | POA: Diagnosis not present

## 2021-11-28 DIAGNOSIS — R0989 Other specified symptoms and signs involving the circulatory and respiratory systems: Secondary | ICD-10-CM | POA: Diagnosis not present

## 2021-11-28 DIAGNOSIS — R278 Other lack of coordination: Secondary | ICD-10-CM | POA: Diagnosis not present

## 2021-11-29 DIAGNOSIS — R2681 Unsteadiness on feet: Secondary | ICD-10-CM | POA: Diagnosis not present

## 2021-11-29 DIAGNOSIS — G20 Parkinson's disease: Secondary | ICD-10-CM | POA: Diagnosis not present

## 2021-11-29 DIAGNOSIS — W19XXXD Unspecified fall, subsequent encounter: Secondary | ICD-10-CM | POA: Diagnosis not present

## 2021-11-29 DIAGNOSIS — R278 Other lack of coordination: Secondary | ICD-10-CM | POA: Diagnosis not present

## 2021-11-29 DIAGNOSIS — R2689 Other abnormalities of gait and mobility: Secondary | ICD-10-CM | POA: Diagnosis not present

## 2021-11-29 DIAGNOSIS — M545 Low back pain, unspecified: Secondary | ICD-10-CM | POA: Diagnosis not present

## 2021-12-03 DIAGNOSIS — W19XXXD Unspecified fall, subsequent encounter: Secondary | ICD-10-CM | POA: Diagnosis not present

## 2021-12-03 DIAGNOSIS — R278 Other lack of coordination: Secondary | ICD-10-CM | POA: Diagnosis not present

## 2021-12-03 DIAGNOSIS — R2689 Other abnormalities of gait and mobility: Secondary | ICD-10-CM | POA: Diagnosis not present

## 2021-12-03 DIAGNOSIS — G20 Parkinson's disease: Secondary | ICD-10-CM | POA: Diagnosis not present

## 2021-12-03 DIAGNOSIS — R2681 Unsteadiness on feet: Secondary | ICD-10-CM | POA: Diagnosis not present

## 2021-12-03 DIAGNOSIS — M545 Low back pain, unspecified: Secondary | ICD-10-CM | POA: Diagnosis not present

## 2021-12-04 DIAGNOSIS — W19XXXD Unspecified fall, subsequent encounter: Secondary | ICD-10-CM | POA: Diagnosis not present

## 2021-12-04 DIAGNOSIS — G20 Parkinson's disease: Secondary | ICD-10-CM | POA: Diagnosis not present

## 2021-12-04 DIAGNOSIS — R2689 Other abnormalities of gait and mobility: Secondary | ICD-10-CM | POA: Diagnosis not present

## 2021-12-04 DIAGNOSIS — R2681 Unsteadiness on feet: Secondary | ICD-10-CM | POA: Diagnosis not present

## 2021-12-04 DIAGNOSIS — M545 Low back pain, unspecified: Secondary | ICD-10-CM | POA: Diagnosis not present

## 2021-12-04 DIAGNOSIS — R278 Other lack of coordination: Secondary | ICD-10-CM | POA: Diagnosis not present

## 2021-12-05 DIAGNOSIS — R2689 Other abnormalities of gait and mobility: Secondary | ICD-10-CM | POA: Diagnosis not present

## 2021-12-05 DIAGNOSIS — M545 Low back pain, unspecified: Secondary | ICD-10-CM | POA: Diagnosis not present

## 2021-12-05 DIAGNOSIS — W19XXXD Unspecified fall, subsequent encounter: Secondary | ICD-10-CM | POA: Diagnosis not present

## 2021-12-05 DIAGNOSIS — R278 Other lack of coordination: Secondary | ICD-10-CM | POA: Diagnosis not present

## 2021-12-05 DIAGNOSIS — G20 Parkinson's disease: Secondary | ICD-10-CM | POA: Diagnosis not present

## 2021-12-05 DIAGNOSIS — R2681 Unsteadiness on feet: Secondary | ICD-10-CM | POA: Diagnosis not present

## 2021-12-06 ENCOUNTER — Other Ambulatory Visit: Payer: Self-pay

## 2021-12-06 ENCOUNTER — Encounter (HOSPITAL_COMMUNITY): Payer: Self-pay | Admitting: Internal Medicine

## 2021-12-06 ENCOUNTER — Emergency Department (HOSPITAL_COMMUNITY): Payer: Medicare Other

## 2021-12-06 ENCOUNTER — Inpatient Hospital Stay (HOSPITAL_COMMUNITY)
Admission: EM | Admit: 2021-12-06 | Discharge: 2021-12-09 | DRG: 872 | Disposition: A | Discharge status: Skilled Nursing Facility | Payer: Medicare Other | Source: Skilled Nursing Facility | Attending: Internal Medicine | Admitting: Internal Medicine

## 2021-12-06 DIAGNOSIS — E872 Acidosis, unspecified: Secondary | ICD-10-CM | POA: Diagnosis present

## 2021-12-06 DIAGNOSIS — R531 Weakness: Secondary | ICD-10-CM | POA: Diagnosis not present

## 2021-12-06 DIAGNOSIS — Z7901 Long term (current) use of anticoagulants: Secondary | ICD-10-CM | POA: Diagnosis not present

## 2021-12-06 DIAGNOSIS — N179 Acute kidney failure, unspecified: Secondary | ICD-10-CM | POA: Diagnosis present

## 2021-12-06 DIAGNOSIS — R54 Age-related physical debility: Secondary | ICD-10-CM | POA: Diagnosis present

## 2021-12-06 DIAGNOSIS — I361 Nonrheumatic tricuspid (valve) insufficiency: Secondary | ICD-10-CM | POA: Diagnosis not present

## 2021-12-06 DIAGNOSIS — I248 Other forms of acute ischemic heart disease: Secondary | ICD-10-CM | POA: Diagnosis present

## 2021-12-06 DIAGNOSIS — N401 Enlarged prostate with lower urinary tract symptoms: Secondary | ICD-10-CM | POA: Diagnosis present

## 2021-12-06 DIAGNOSIS — N3000 Acute cystitis without hematuria: Secondary | ICD-10-CM

## 2021-12-06 DIAGNOSIS — Z20822 Contact with and (suspected) exposure to covid-19: Secondary | ICD-10-CM | POA: Diagnosis present

## 2021-12-06 DIAGNOSIS — R9431 Abnormal electrocardiogram [ECG] [EKG]: Secondary | ICD-10-CM

## 2021-12-06 DIAGNOSIS — M1990 Unspecified osteoarthritis, unspecified site: Secondary | ICD-10-CM | POA: Diagnosis present

## 2021-12-06 DIAGNOSIS — I480 Paroxysmal atrial fibrillation: Secondary | ICD-10-CM | POA: Diagnosis present

## 2021-12-06 DIAGNOSIS — Z85828 Personal history of other malignant neoplasm of skin: Secondary | ICD-10-CM | POA: Diagnosis not present

## 2021-12-06 DIAGNOSIS — N400 Enlarged prostate without lower urinary tract symptoms: Secondary | ICD-10-CM | POA: Diagnosis not present

## 2021-12-06 DIAGNOSIS — A401 Sepsis due to streptococcus, group B: Secondary | ICD-10-CM | POA: Diagnosis not present

## 2021-12-06 DIAGNOSIS — G20 Parkinson's disease: Secondary | ICD-10-CM | POA: Diagnosis present

## 2021-12-06 DIAGNOSIS — I495 Sick sinus syndrome: Secondary | ICD-10-CM | POA: Diagnosis present

## 2021-12-06 DIAGNOSIS — F32A Depression, unspecified: Secondary | ICD-10-CM | POA: Diagnosis present

## 2021-12-06 DIAGNOSIS — E860 Dehydration: Secondary | ICD-10-CM

## 2021-12-06 DIAGNOSIS — Z7984 Long term (current) use of oral hypoglycemic drugs: Secondary | ICD-10-CM | POA: Diagnosis not present

## 2021-12-06 DIAGNOSIS — E039 Hypothyroidism, unspecified: Secondary | ICD-10-CM | POA: Diagnosis present

## 2021-12-06 DIAGNOSIS — Z880 Allergy status to penicillin: Secondary | ICD-10-CM

## 2021-12-06 DIAGNOSIS — N390 Urinary tract infection, site not specified: Secondary | ICD-10-CM | POA: Diagnosis present

## 2021-12-06 DIAGNOSIS — E782 Mixed hyperlipidemia: Secondary | ICD-10-CM | POA: Diagnosis present

## 2021-12-06 DIAGNOSIS — Z87891 Personal history of nicotine dependence: Secondary | ICD-10-CM | POA: Diagnosis not present

## 2021-12-06 DIAGNOSIS — I452 Bifascicular block: Secondary | ICD-10-CM | POA: Diagnosis present

## 2021-12-06 DIAGNOSIS — R000 Tachycardia, unspecified: Secondary | ICD-10-CM | POA: Diagnosis not present

## 2021-12-06 DIAGNOSIS — Z7401 Bed confinement status: Secondary | ICD-10-CM | POA: Diagnosis not present

## 2021-12-06 DIAGNOSIS — R1084 Generalized abdominal pain: Secondary | ICD-10-CM | POA: Diagnosis present

## 2021-12-06 DIAGNOSIS — I700 Atherosclerosis of aorta: Secondary | ICD-10-CM | POA: Diagnosis not present

## 2021-12-06 DIAGNOSIS — Z801 Family history of malignant neoplasm of trachea, bronchus and lung: Secondary | ICD-10-CM

## 2021-12-06 DIAGNOSIS — Z79899 Other long term (current) drug therapy: Secondary | ICD-10-CM

## 2021-12-06 DIAGNOSIS — R109 Unspecified abdominal pain: Secondary | ICD-10-CM | POA: Diagnosis not present

## 2021-12-06 DIAGNOSIS — Z887 Allergy status to serum and vaccine status: Secondary | ICD-10-CM

## 2021-12-06 DIAGNOSIS — E876 Hypokalemia: Secondary | ICD-10-CM | POA: Diagnosis not present

## 2021-12-06 DIAGNOSIS — Z820 Family history of epilepsy and other diseases of the nervous system: Secondary | ICD-10-CM

## 2021-12-06 DIAGNOSIS — R778 Other specified abnormalities of plasma proteins: Secondary | ICD-10-CM | POA: Diagnosis not present

## 2021-12-06 DIAGNOSIS — Z823 Family history of stroke: Secondary | ICD-10-CM

## 2021-12-06 DIAGNOSIS — Z79890 Hormone replacement therapy (postmenopausal): Secondary | ICD-10-CM

## 2021-12-06 DIAGNOSIS — R197 Diarrhea, unspecified: Secondary | ICD-10-CM | POA: Diagnosis not present

## 2021-12-06 DIAGNOSIS — E119 Type 2 diabetes mellitus without complications: Secondary | ICD-10-CM | POA: Diagnosis present

## 2021-12-06 DIAGNOSIS — Z950 Presence of cardiac pacemaker: Secondary | ICD-10-CM | POA: Diagnosis not present

## 2021-12-06 LAB — CBC WITH DIFFERENTIAL/PLATELET
Abs Immature Granulocytes: 0.06 10*3/uL (ref 0.00–0.07)
Basophils Absolute: 0 10*3/uL (ref 0.0–0.1)
Basophils Relative: 0 %
Eosinophils Absolute: 0 10*3/uL (ref 0.0–0.5)
Eosinophils Relative: 0 %
HCT: 34.5 % — ABNORMAL LOW (ref 39.0–52.0)
Hemoglobin: 12.3 g/dL — ABNORMAL LOW (ref 13.0–17.0)
Immature Granulocytes: 1 %
Lymphocytes Relative: 7 %
Lymphs Abs: 0.8 10*3/uL (ref 0.7–4.0)
MCH: 33.2 pg (ref 26.0–34.0)
MCHC: 35.7 g/dL (ref 30.0–36.0)
MCV: 93 fL (ref 80.0–100.0)
Monocytes Absolute: 0.8 10*3/uL (ref 0.1–1.0)
Monocytes Relative: 7 %
Neutro Abs: 9.4 10*3/uL — ABNORMAL HIGH (ref 1.7–7.7)
Neutrophils Relative %: 85 %
Platelets: 296 10*3/uL (ref 150–400)
RBC: 3.71 MIL/uL — ABNORMAL LOW (ref 4.22–5.81)
RDW: 15.3 % (ref 11.5–15.5)
WBC: 11.1 10*3/uL — ABNORMAL HIGH (ref 4.0–10.5)
nRBC: 0 % (ref 0.0–0.2)

## 2021-12-06 LAB — COMPREHENSIVE METABOLIC PANEL
ALT: 9 U/L (ref 0–44)
AST: 21 U/L (ref 15–41)
Albumin: 3.7 g/dL (ref 3.5–5.0)
Alkaline Phosphatase: 59 U/L (ref 38–126)
Anion gap: 14 (ref 5–15)
BUN: 46 mg/dL — ABNORMAL HIGH (ref 8–23)
CO2: 21 mmol/L — ABNORMAL LOW (ref 22–32)
Calcium: 9.5 mg/dL (ref 8.9–10.3)
Chloride: 106 mmol/L (ref 98–111)
Creatinine, Ser: 1.66 mg/dL — ABNORMAL HIGH (ref 0.61–1.24)
GFR, Estimated: 40 mL/min — ABNORMAL LOW (ref >60)
Glucose, Bld: 139 mg/dL — ABNORMAL HIGH (ref 70–99)
Potassium: 2.6 mmol/L — CL (ref 3.5–5.1)
Sodium: 141 mmol/L (ref 135–145)
Total Bilirubin: 1.1 mg/dL (ref 0.3–1.2)
Total Protein: 7.7 g/dL (ref 6.5–8.1)

## 2021-12-06 LAB — URINALYSIS, ROUTINE W REFLEX MICROSCOPIC
Bilirubin Urine: NEGATIVE
Glucose, UA: 150 mg/dL — AB
Ketones, ur: NEGATIVE mg/dL
Nitrite: NEGATIVE
Protein, ur: 30 mg/dL — AB
Specific Gravity, Urine: 1.015 (ref 1.005–1.030)
WBC, UA: >50 WBC/hpf — ABNORMAL HIGH (ref 0–5)
pH: 5 (ref 5.0–8.0)

## 2021-12-06 LAB — CBC
HCT: 30.1 % — ABNORMAL LOW (ref 39.0–52.0)
Hemoglobin: 10.8 g/dL — ABNORMAL LOW (ref 13.0–17.0)
MCH: 33.1 pg (ref 26.0–34.0)
MCHC: 35.9 g/dL (ref 30.0–36.0)
MCV: 92.3 fL (ref 80.0–100.0)
Platelets: 256 10*3/uL (ref 150–400)
RBC: 3.26 MIL/uL — ABNORMAL LOW (ref 4.22–5.81)
RDW: 15.3 % (ref 11.5–15.5)
WBC: 8.1 10*3/uL (ref 4.0–10.5)
nRBC: 0 % (ref 0.0–0.2)

## 2021-12-06 LAB — TROPONIN I (HIGH SENSITIVITY)
Troponin I (High Sensitivity): 29 ng/L — ABNORMAL HIGH (ref <18)
Troponin I (High Sensitivity): 109 ng/L (ref <18)
Troponin I (High Sensitivity): 245 ng/L (ref <18)

## 2021-12-06 LAB — LACTIC ACID, PLASMA
Lactic Acid, Venous: 3.2 mmol/L (ref 0.5–1.9)
Lactic Acid, Venous: 2.7 mmol/L (ref 0.5–1.9)
Lactic Acid, Venous: 1.4 mmol/L (ref 0.5–1.9)

## 2021-12-06 LAB — CREATININE, SERUM
Creatinine, Ser: 1.01 mg/dL (ref 0.61–1.24)
GFR, Estimated: >60 mL/min (ref >60)

## 2021-12-06 LAB — HEMOGLOBIN A1C
Hgb A1c MFr Bld: 6.2 % — ABNORMAL HIGH (ref 4.8–5.6)
Mean Plasma Glucose: 131.24 mg/dL

## 2021-12-06 LAB — LIPASE, BLOOD: Lipase: 18 U/L (ref 11–51)

## 2021-12-06 LAB — CBG MONITORING, ED: Glucose-Capillary: 93 mg/dL (ref 70–99)

## 2021-12-06 LAB — MAGNESIUM: Magnesium: 1.8 mg/dL (ref 1.7–2.4)

## 2021-12-06 MED ORDER — HEPARIN SODIUM (PORCINE) 5000 UNIT/ML IJ SOLN: 5000.0000 [IU] | Freq: Three times a day (TID) | INTRAMUSCULAR | Status: DC

## 2021-12-06 MED ORDER — SODIUM CHLORIDE 0.9 % IV BOLUS
500.0000 mL | Freq: Once | INTRAVENOUS | Status: AC
  Administered 2021-12-06: 500 mL via INTRAVENOUS

## 2021-12-06 MED ORDER — LEVOTHYROXINE SODIUM 75 MCG PO TABS
75.0000 ug | ORAL_TABLET | Freq: Every day | ORAL | Status: DC
  Administered 2021-12-07 – 2021-12-09 (×3): 75 ug via ORAL
  Filled 2021-12-06 (×3): qty 1

## 2021-12-06 MED ORDER — CIPROFLOXACIN IN D5W 400 MG/200ML IV SOLN: 400.0000 mg | Freq: Two times a day (BID) | INTRAVENOUS | Status: DC

## 2021-12-06 MED ORDER — SODIUM CHLORIDE 0.9 % IV SOLN
1.0000 g | INTRAVENOUS | Status: DC
  Administered 2021-12-06: 1 g via INTRAVENOUS
  Filled 2021-12-06: qty 10

## 2021-12-06 MED ORDER — SERTRALINE HCL 100 MG PO TABS
100.0000 mg | ORAL_TABLET | Freq: Every day | ORAL | Status: DC
  Administered 2021-12-07 – 2021-12-09 (×3): 100 mg via ORAL
  Filled 2021-12-06 (×3): qty 1

## 2021-12-06 MED ORDER — INSULIN ASPART 100 UNIT/ML IJ SOLN: 0.0000 [IU] | Freq: Three times a day (TID) | INTRAMUSCULAR | Status: DC

## 2021-12-06 MED ORDER — MAGNESIUM SULFATE 2 GM/50ML IV SOLN
2.0000 g | Freq: Once | INTRAVENOUS | Status: AC
  Administered 2021-12-06: 2 g via INTRAVENOUS
  Filled 2021-12-06: qty 50

## 2021-12-06 MED ORDER — RIVAROXABAN 20 MG PO TABS
20.0000 mg | ORAL_TABLET | Freq: Every day | ORAL | Status: DC
  Administered 2021-12-07 – 2021-12-08 (×2): 20 mg via ORAL
  Filled 2021-12-06 (×2): qty 1

## 2021-12-06 MED ORDER — SIMVASTATIN 20 MG PO TABS
40.0000 mg | ORAL_TABLET | Freq: Every day | ORAL | Status: DC
  Administered 2021-12-06 – 2021-12-08 (×3): 40 mg via ORAL
  Filled 2021-12-06 (×3): qty 2

## 2021-12-06 MED ORDER — CARBIDOPA-LEVODOPA 25-100 MG PO TABS
2.0000 | ORAL_TABLET | Freq: Three times a day (TID) | ORAL | Status: DC
  Administered 2021-12-06 – 2021-12-09 (×8): 2 via ORAL
  Filled 2021-12-06 (×8): qty 2

## 2021-12-06 MED ORDER — POTASSIUM CHLORIDE 10 MEQ/100ML IV SOLN
10.0000 meq | INTRAVENOUS | Status: AC
  Administered 2021-12-06 (×3): 10 meq via INTRAVENOUS
  Filled 2021-12-06 (×3): qty 100

## 2021-12-06 MED ORDER — TAMSULOSIN HCL 0.4 MG PO CAPS
0.4000 mg | ORAL_CAPSULE | Freq: Every day | ORAL | Status: DC
  Administered 2021-12-07 – 2021-12-09 (×3): 0.4 mg via ORAL
  Filled 2021-12-06 (×3): qty 1

## 2021-12-06 MED ORDER — ONDANSETRON HCL 4 MG/2ML IJ SOLN
4.0000 mg | Freq: Four times a day (QID) | INTRAMUSCULAR | Status: DC | PRN
  Filled 2021-12-06: qty 2

## 2021-12-06 MED ORDER — SODIUM CHLORIDE 0.9 % IV SOLN: INTRAVENOUS | Status: AC

## 2021-12-06 MED ORDER — POTASSIUM CHLORIDE CRYS ER 20 MEQ PO TBCR
40.0000 meq | EXTENDED_RELEASE_TABLET | Freq: Once | ORAL | Status: AC
  Administered 2021-12-06: 40 meq via ORAL
  Filled 2021-12-06: qty 2

## 2021-12-06 MED ORDER — ONDANSETRON HCL 4 MG/2ML IJ SOLN
4.0000 mg | Freq: Once | INTRAMUSCULAR | Status: AC
  Administered 2021-12-06: 4 mg via INTRAVENOUS
  Filled 2021-12-06: qty 2

## 2021-12-06 NOTE — ED Triage Notes (Signed)
PT BIB EMS from [facility] for weakness and GI upset x1 week with increase past 2 days. Tested negative for Covid.C/o General ABD pai. Near syncapal episode while ambulating this morning   A&Ox4 baseline    102/74 99% RA  80 HR  20 RR  CBG 151 97.7 temp

## 2021-12-06 NOTE — ED Provider Notes (Signed)
[facility] EMERGENCY DEPARTMENT Provider Note   CSN: [id number] Arrival date & time: 12/06/21  1243     History {Add pertinent medical, surgical, social history, OB history to HPI:1} Chief Complaint  Patient presents with   Weakness   Abdominal Pain    Stephen Robbins is a 86 y.o. male.   Weakness Associated symptoms: abdominal pain   Abdominal Pain      Home Medications Prior to Admission medications   Medication Sig Start Date End Date Taking? Authorizing Provider  blood glucose meter kit and supplies Dispense based on patient and insurance preference. Check blood sugars daily and as needed Dx: e11.9 03/14/21   Colon Branch, MD  ACCU-CHEK FASTCLIX LANCETS MISC Check blood sugar three times daily Patient not taking: Reported on 11/15/2020 02/12/18   Colon Branch, MD  bisacodyl (DULCOLAX) 5 MG EC tablet Take 5 mg daily as needed by mouth for moderate constipation. Patient not taking: No sig reported    [provider]  Blood Pressure KIT Take blood pressure twice weekly. 03/14/21   Colon Branch, MD  buPROPion (WELLBUTRIN) 75 MG tablet TAKE 1 TABLET(75 MG) BY MOUTH TWICE DAILY 05/03/21   Colon Branch, MD  carbidopa-levodopa (SINEMET IR) 25-100 MG tablet Take 2 tablets by mouth 3 (three) times daily. 05/28/21   [name], [name], DO  FARXIGA 10 MG TABS tablet TAKE 1 TABLET(10 MG) BY MOUTH DAILY 01/08/21   Colon Branch, MD  gabapentin (NEURONTIN) 300 MG capsule TAKE 1 CAPSULE(300 MG) BY MOUTH TWICE DAILY 06/18/21   [name], [name], DO  GEMTESA 75 MG TABS Take 1 tablet by mouth daily. 11/02/20   [provider]  levothyroxine (SYNTHROID) 75 MCG tablet TAKE 1 TABLET(75 MCG) BY MOUTH DAILY BEFORE AND BREAKFAST 01/01/21   Colon Branch, MD  metFORMIN (GLUCOPHAGE) 1000 MG tablet TAKE 1 TABLET(1000 MG) BY MOUTH TWICE DAILY WITH A MEAL 10/10/20   Paz, [name], MD  polyethylene glycol powder [facility]) powder Take 17 g daily by mouth. 02/04/17   Marletta Lor, MD   rivaroxaban (XARELTO) 20 MG TABS tablet Take 1 tablet (20 mg total) by mouth daily with supper. NEEDS CARDIOLOGY APPOINTMENT, PLEASE CALL OFFICE 06/25/21   Croitoru, [name], MD  sertraline (ZOLOFT) 100 MG tablet Take 1 tablet (100 mg total) by mouth daily. 12/07/20   Colon Branch, MD  simvastatin (ZOCOR) 40 MG tablet Take 1 tablet (40 mg total) by mouth at bedtime. 10/23/20   Colon Branch, MD  tamsulosin (FLOMAX) 0.4 MG CAPS capsule Take 0.4 mg by mouth daily. 04/03/20   [provider]  vitamin B-12 (CYANOCOBALAMIN) 1000 MCG tablet Take 1,000 mcg by mouth every morning.     [provider]  VITAMIN D, ERGOCALCIFEROL, PO Take by mouth.    [provider]      Allergies    Covid-19 mrna vacc (moderna) and Penicillins    Review of Systems   Review of Systems  Gastrointestinal:  Positive for abdominal pain.  Neurological:  Positive for weakness.    Physical Exam Updated Vital Signs BP 113/72   Pulse 72   Temp 97.7 F (36.5 C) (Oral)   Resp (!) 26   SpO2 100%  Physical Exam  ED Results / Procedures / Treatments   Labs (all labs ordered are listed, but only abnormal results are displayed) Labs Reviewed  COMPREHENSIVE METABOLIC PANEL - Abnormal; Notable for the following components:  Result Value   Potassium 2.6 (*)    CO2 21 (*)    Glucose, Bld 139 (*)    BUN 46 (*)    Creatinine, Ser 1.66 (*)    GFR, Estimated 40 (*)    All other components within normal limits  CBC WITH DIFFERENTIAL/PLATELET - Abnormal; Notable for the following components:   WBC 11.1 (*)    RBC 3.71 (*)    Hemoglobin 12.3 (*)    HCT 34.5 (*)    Neutro Abs 9.4 (*)    All other components within normal limits  LACTIC ACID, PLASMA - Abnormal; Notable for the following components:   Lactic Acid, Venous 3.2 (*)    All other components within normal limits  LACTIC ACID, PLASMA - Abnormal; Notable for the following components:   Lactic Acid, Venous 2.7 (*)    All other components  within normal limits  URINALYSIS, ROUTINE W REFLEX MICROSCOPIC - Abnormal; Notable for the following components:   APPearance CLOUDY (*)    Glucose, UA 150 (*)    Hgb urine dipstick MODERATE (*)    Protein, ur 30 (*)    Leukocytes,Ua LARGE (*)    WBC, UA >50 (*)    Bacteria, UA MANY (*)    All other components within normal limits  TROPONIN I (HIGH SENSITIVITY) - Abnormal; Notable for the following components:   Troponin I (High Sensitivity) 29 (*)    All other components within normal limits  TROPONIN I (HIGH SENSITIVITY) - Abnormal; Notable for the following components:   Troponin I (High Sensitivity) 109 (*)    All other components within normal limits  CULTURE, BLOOD (ROUTINE X 2)  CULTURE, BLOOD (ROUTINE X 2)  URINE CULTURE  LIPASE, BLOOD  MAGNESIUM  POC OCCULT BLOOD, ED    EKG EKG Interpretation  Date/Time:  Thursday December 06 2021 12:50:05 EDT Ventricular Rate:  82 PR Interval:  163 QRS Duration: 166 QT Interval:  431 QTC Calculation: 504 R Axis:   -62 Text Interpretation: Sinus rhythm RBBB and LAFB when compared to prior, appears paced with slightly different morphology. No STEMI Confirmed by Antony Blackbird [phone]) on 12/06/2021 12:52:51 PM  Radiology CT ABDOMEN PELVIS WO CONTRAST  Result Date: 12/06/2021 CLINICAL DATA:  Abdominal pain, weakness, in GI upset for 1 week increased in past 2 days, near syncopal episode this morning while ambulating. Past history diabetes mellitus, former smoker, arrhythmia EXAM: CT ABDOMEN AND PELVIS WITHOUT CONTRAST TECHNIQUE: Multidetector CT imaging of the abdomen and pelvis was performed following the standard protocol without IV contrast. RADIATION DOSE REDUCTION: This exam was performed according to the departmental dose-optimization program which includes automated exposure control, adjustment of the mA and/or kV according to patient size and/or use of iterative reconstruction technique. COMPARISON:  None Available. FINDINGS:  Lower chest: Pacemaker leads RIGHT atrium and RIGHT ventricle. Several foci of minimal ground-glass opacity centrally in LEFT lower lobe, largest 6 mm. RIGHT lung base clear. Hepatobiliary: Gallbladder and liver normal appearance Pancreas: Fatty replacement of pancreas. Few calcifications at pancreatic head and uncinate. Spleen: Normal appearance Adrenals/Urinary Tract: Adrenal glands normal appearance. Tiny nonobstructing renal calculi. No renal mass, hydronephrosis or hydroureter. Bladder wall thickening question chronic outlet obstruction. Stomach/Bowel: Appendix not definitely visualized. Small amount of fluid in rectum. Stomach and bowel loops otherwise normal appearance. Vascular/Lymphatic: Atherosclerotic calcifications aorta and iliac arteries without aneurysm. No adenopathy. Reproductive: Mild prostatic enlargement Other: No free air or free fluid. No hernia or inflammatory process. Musculoskeletal: Unremarkable IMPRESSION: Tiny nonobstructing  renal calculi. Bladder wall thickening question chronic outlet obstruction secondary to mild prostatic enlargement. Several foci of minimal ground-glass opacity centrally in LEFT lower lobe, largest 6 mm, nonspecific; follow-up CT imaging is recommended at 3-6 months to determine persistence. Aortic Atherosclerosis (ICD10-I70.0). Electronically Signed   By: Lavonia Dana M.D.   On: 12/06/2021 17:35   DG Chest Port 1 View  Result Date: 12/06/2021 CLINICAL DATA:  Weakness EXAM: PORTABLE CHEST 1 VIEW COMPARISON:  02/08/2014 FINDINGS: No focal consolidation. No pleural effusion or pneumothorax. Heart and mediastinal contours are unremarkable. Dual lead cardiac pacemaker. No acute osseous abnormality. IMPRESSION: No active disease. Electronically Signed   By: Kathreen Devoid M.D.   On: 12/06/2021 13:50    Procedures Procedures  {Document cardiac monitor, telemetry assessment procedure when appropriate:1}  Medications Ordered in ED Medications  sodium chloride 0.9 %  bolus 500 mL (0 mLs Intravenous Stopped 12/06/21 1519)  ondansetron (ZOFRAN) injection 4 mg (4 mg Intravenous Given 12/06/21 1326)  magnesium sulfate IVPB 2 g 50 mL (0 g Intravenous Stopped 12/06/21 1638)  potassium chloride 10 mEq in 100 mL IVPB (0 mEq Intravenous Stopped 12/06/21 1916)  sodium chloride 0.9 % bolus 500 mL (0 mLs Intravenous Stopped 12/06/21 1638)    ED Course/ Medical Decision Making/ A&P                           Medical Decision Making Amount and/or Complexity of Data Reviewed Labs: ordered. Radiology: ordered.  Risk Prescription drug management.   ***  {Document critical care time when appropriate:1} {Document review of labs and clinical decision tools ie heart score, Chads2Vasc2 etc:1}  {Document your independent review of radiology images, and any outside records:1} {Document your discussion with family members, caretakers, and with consultants:1} {Document social determinants of health affecting pt's care:1} {Document your decision making why or why not admission, treatments were needed:1} Final Clinical Impression(s) / ED Diagnoses Final diagnoses:  None    Rx / DC Orders ED Discharge Orders     None

## 2021-12-06 NOTE — H&P (Signed)
History and Physical    Patient: Stephen Robbins:[id number] DOB: 01-Sep-1935 DOA: 12/06/2021 DOS: the patient was seen and examined on 12/06/2021 PCP: Stephen Glazier, MD  Patient coming from: ALF/ILF  Chief Complaint:  Chief Complaint  Patient presents with   Weakness   Abdominal Pain   HPI: Stephen Robbins is a 86 y.o. male with medical history significant of Parkinson's disease, sick sinus node dysfunction s/p cardiac pacemaker, hypothyroidism, T2DM, hyperlipidemia, BPH, paroxysmal A-fib on Xarelto who presents to the emergency department via EMS from [facility] facility due to abdominal pain and weakness which has been going on for about a week but worsened within the past 2 days.  He endorsed nonbloody but watery diarrhea that has been ongoing for more than 1 week, last bowel movement was yesterday and patient has had decreased food and fluid intake within this period and now complaining of weakness.  Patient states that he has been having increased urinary frequency within the past 1 to 2 weeks, but denies burning sensation on urination.  Patient denies chest pain, shortness of breath, fever, chills, headache, sick contacts.  ED Course:  In the emergency department, he was tachypneic, but other vital signs were within normal range.  Work-up in the ED showed WBC 11.1, hemoglobin 12.3, hematocrit 34.5, MCV 93.0, platelets 296.  BMP showed sodium 141, potassium 2.6, chloride 106, bicarb 21, glucose 139, BUN 46, creatinine 1.66 (baseline creatinine at 0.7-1.0), troponin x 2 - 29 > 109, lactic acid 3.2 > 2.7, magnesium 1.8, urinalysis was positive for moderate urine hemoglobin, large leukocytes and > 50 WBC.  Lipase 18. Chest x-ray showed no active disease CT abdomen and pelvis without contrast showed: Tiny nonobstructing renal calculi.  Bladder wall thickening question chronic outlet obstruction secondary to mild prostatic enlargement.  Several foci of minimal ground-glass opacity  centrally in LEFT lower lobe, largest 6 mm, nonspecific; follow-up CT imaging is recommended at 3-6 months to determine persistence. Magnesium was replenished, Zofran was given, potassium was replenished and IV hydration was provided.  Hospitalist was asked to admit patient for further evaluation and management.  Review of Systems: Review of systems as noted in the HPI. All other systems reviewed and are negative.   Past Medical History:  Diagnosis Date   Arthritis    "joints; a little" (01/04/2014)   Asymptomatic bilateral carotid artery stenosis    per duplex  05-15-2012  left >39%/   right 40-59%   Basal cell carcinoma    nose   Borderline diabetes    BPH (benign prostatic hypertrophy) with urinary obstruction    Bradycardia    Bronchial pneumonia 1958   Chronotropic incompetence with sinus node dysfunction (HCC)    Compressed cervical disc    Compression of lumbar vertebra (HCC)    L4 -- L5   Dizzy    Dysmetabolic syndrome    Fatigue    Frequency of urination    GERD (gastroesophageal reflux disease)    occasional   Hemorrhoids    Hyperlipidemia    Hypothyroidism    Nocturia    NSVT (nonsustained ventricular tachycardia) [facility])    cardiologist-  dr croitoru   Pacemaker    Urgency of urination    Wears glasses    Past Surgical History:  Procedure Laterality Date   BASAL CELL CARCINOMA EXCISION     nose   CLOSED REDUCTION NASAL FRACTURE  09-01-2007   CYSTOSCOPY N/A 12/28/2012   Procedure: CYSTOSCOPY;  Surgeon: Bernestine Amass, MD;  Location: [place];  Service: Urology;  Laterality: N/A;   EXERCISE TOLERENCE TEST  12-03-2012  DR CROITURO   CHRONOTROPIC INCOMPETENCE/ NORMAL RESTING BP W/ APPROPRIATE RESPONSE/ NO CHEST PAIN/ NO ST CHANGES FROM BASELINE   INSERT / REPLACE / REMOVE PACEMAKER  01/04/2014   [employer] model L121 serial number [id number]   LACERATION REPAIR Right 1978   middle finger   NEUROPLASTY / TRANSPOSITION ULNAR NERVE  AT ELBOW Left 02-09-2010   PERMANENT PACEMAKER INSERTION N/A 01/04/2014   Procedure: PERMANENT PACEMAKER INSERTION;  Surgeon: Sanda Klein, MD;  Location: [facility] C[facility];  Service: Cardiovascular;  Laterality: N/A;   SKIN BIOPSY     scc   TONSILLECTOMY AND ADENOIDECTOMY  1944   TRANSURETHRAL INCISION OF PROSTATE N/A 12/28/2012   Procedure: TRANSURETHRAL INCISION OF THE PROSTATE (TUIP);  Surgeon: Bernestine Amass, MD;  Location: [facility];  Service: Urology;  Laterality: N/A;   ULNAR NERVE TRANSPOSITION      Social History:  reports that he quit smoking about 53 years ago. His smoking use included cigarettes. He has a 10.00 pack-year smoking history. He has never used smokeless tobacco. He reports current alcohol use of about 7.0 standard drinks of alcohol per week. He reports that he does not use drugs.   Allergies  Allergen Reactions   Covid-19 Mrna Vacc (Moderna)     "Couldn't move". No rash, no itching    Penicillins Other (See Comments)    Unknown childhood reaction    Family History  Problem Relation Age of Onset   Stroke Father    Lung cancer Mother    Parkinson's disease Brother    Heart Problems Brother      Prior to Admission medications   Medication Sig Start Date End Date Taking? Authorizing Provider  blood glucose meter kit and supplies Dispense based on patient and insurance preference. Check blood sugars daily and as needed Dx: e11.9 03/14/21   Colon Branch, MD  ACCU-CHEK FASTCLIX LANCETS MISC Check blood sugar three times daily Patient not taking: Reported on 11/15/2020 02/12/18   Colon Branch, MD  bisacodyl (DULCOLAX) 5 MG EC tablet Take 5 mg daily as needed by mouth for moderate constipation. Patient not taking: No sig reported    [provider]  Blood Pressure KIT Take blood pressure twice weekly. 03/14/21   Colon Branch, MD  buPROPion (WELLBUTRIN) 75 MG tablet TAKE 1 TABLET(75 MG) BY MOUTH TWICE DAILY 05/03/21   Colon Branch, MD   carbidopa-levodopa (SINEMET IR) 25-100 MG tablet Take 2 tablets by mouth 3 (three) times daily. 05/28/21   [name], [name], DO  FARXIGA 10 MG TABS tablet TAKE 1 TABLET(10 MG) BY MOUTH DAILY 01/08/21   Colon Branch, MD  gabapentin (NEURONTIN) 300 MG capsule TAKE 1 CAPSULE(300 MG) BY MOUTH TWICE DAILY 06/18/21   [name], [name], DO  GEMTESA 75 MG TABS Take 1 tablet by mouth daily. 11/02/20   [provider]  levothyroxine (SYNTHROID) 75 MCG tablet TAKE 1 TABLET(75 MCG) BY MOUTH DAILY BEFORE AND BREAKFAST 01/01/21   Colon Branch, MD  metFORMIN (GLUCOPHAGE) 1000 MG tablet TAKE 1 TABLET(1000 MG) BY MOUTH TWICE DAILY WITH A MEAL 10/10/20   Paz, [name], MD  polyethylene glycol powder [facility]) powder Take 17 g daily by mouth. 02/04/17   Marletta Lor, MD  rivaroxaban (XARELTO) 20 MG TABS tablet Take 1 tablet (20 mg total) by mouth daily with supper. NEEDS CARDIOLOGY  APPOINTMENT, PLEASE CALL OFFICE 06/25/21   Croitoru, [name], MD  sertraline (ZOLOFT) 100 MG tablet Take 1 tablet (100 mg total) by mouth daily. 12/07/20   Colon Branch, MD  simvastatin (ZOCOR) 40 MG tablet Take 1 tablet (40 mg total) by mouth at bedtime. 10/23/20   Colon Branch, MD  tamsulosin (FLOMAX) 0.4 MG CAPS capsule Take 0.4 mg by mouth daily. 04/03/20   [provider]  vitamin B-12 (CYANOCOBALAMIN) 1000 MCG tablet Take 1,000 mcg by mouth every morning.     [provider]  VITAMIN D, ERGOCALCIFEROL, PO Take by mouth.    [provider]    Physical Exam: BP 94/80   Pulse 88   Temp 97.7 F (36.5 C) (Oral)   Resp (!) 24   SpO2 100%   General: 86 y.o. year-old male ill appearing, but in no acute distress.  Alert and oriented x3. HEENT: NCAT, EOMI, dry mucous membrane Neck: Supple, trachea medial Cardiovascular: Regular rate and rhythm with no rubs or gallops.  No thyromegaly or JVD noted.  No lower extremity edema. 2/4 pulses in all 4 extremities. Respiratory: Tachypnea.  Clear to auscultation with no  wheezes or rales.  Abdomen: Soft, nontender nondistended with normal bowel sounds x4 quadrants. Muskuloskeletal: No cyanosis, clubbing or edema noted bilaterally Neuro: CN II-XII intact, sensation, reflexes intact Skin: No ulcerative lesions noted or rashes Psychiatry: Judgement and insight appear normal. Mood is appropriate for condition and setting          Labs on Admission:  Basic Metabolic Panel: Recent Labs  Lab 12/06/21 1257 12/06/21 1536  NA 141  --   K 2.6*  --   CL 106  --   CO2 21*  --   GLUCOSE 139*  --   BUN 46*  --   CREATININE 1.66*  --   CALCIUM 9.5  --   MG  --  1.8   Liver Function Tests: Recent Labs  Lab 12/06/21 1257  AST 21  ALT 9  ALKPHOS 59  BILITOT 1.1  PROT 7.7  ALBUMIN 3.7   Recent Labs  Lab 12/06/21 1257  LIPASE 18   No results for input(s): "AMMONIA" in the last 168 hours. CBC: Recent Labs  Lab 12/06/21 1257  WBC 11.1*  NEUTROABS 9.4*  HGB 12.3*  HCT 34.5*  MCV 93.0  PLT 296   Cardiac Enzymes: No results for input(s): "CKTOTAL", "CKMB", "CKMBINDEX", "TROPONINI" in the last 168 hours.  BNP (last 3 results) No results for input(s): "BNP" in the last 8760 hours.  ProBNP (last 3 results) No results for input(s): "PROBNP" in the last 8760 hours.  CBG: No results for input(s): "GLUCAP" in the last 168 hours.  Radiological Exams on Admission: CT ABDOMEN PELVIS WO CONTRAST  Result Date: 12/06/2021 CLINICAL DATA:  Abdominal pain, weakness, in GI upset for 1 week increased in past 2 days, near syncopal episode this morning while ambulating. Past history diabetes mellitus, former smoker, arrhythmia EXAM: CT ABDOMEN AND PELVIS WITHOUT CONTRAST TECHNIQUE: Multidetector CT imaging of the abdomen and pelvis was performed following the standard protocol without IV contrast. RADIATION DOSE REDUCTION: This exam was performed according to the departmental dose-optimization program which includes automated exposure control, adjustment of the  mA and/or kV according to patient size and/or use of iterative reconstruction technique. COMPARISON:  None Available. FINDINGS: Lower chest: Pacemaker leads RIGHT atrium and RIGHT ventricle. Several foci of minimal ground-glass opacity centrally in LEFT lower lobe, largest 6 mm. RIGHT lung  base clear. Hepatobiliary: Gallbladder and liver normal appearance Pancreas: Fatty replacement of pancreas. Few calcifications at pancreatic head and uncinate. Spleen: Normal appearance Adrenals/Urinary Tract: Adrenal glands normal appearance. Tiny nonobstructing renal calculi. No renal mass, hydronephrosis or hydroureter. Bladder wall thickening question chronic outlet obstruction. Stomach/Bowel: Appendix not definitely visualized. Small amount of fluid in rectum. Stomach and bowel loops otherwise normal appearance. Vascular/Lymphatic: Atherosclerotic calcifications aorta and iliac arteries without aneurysm. No adenopathy. Reproductive: Mild prostatic enlargement Other: No free air or free fluid. No hernia or inflammatory process. Musculoskeletal: Unremarkable IMPRESSION: Tiny nonobstructing renal calculi. Bladder wall thickening question chronic outlet obstruction secondary to mild prostatic enlargement. Several foci of minimal ground-glass opacity centrally in LEFT lower lobe, largest 6 mm, nonspecific; follow-up CT imaging is recommended at 3-6 months to determine persistence. Aortic Atherosclerosis (ICD10-I70.0). Electronically Signed   By: Lavonia Dana M.D.   On: 12/06/2021 17:35   DG Chest Port 1 View  Result Date: 12/06/2021 CLINICAL DATA:  Weakness EXAM: PORTABLE CHEST 1 VIEW COMPARISON:  02/08/2014 FINDINGS: No focal consolidation. No pleural effusion or pneumothorax. Heart and mediastinal contours are unremarkable. Dual lead cardiac pacemaker. No acute osseous abnormality. IMPRESSION: No active disease. Electronically Signed   By: Kathreen Devoid M.D.   On: 12/06/2021 13:50    EKG: I independently viewed the EKG done  and my findings are as followed: Normal sinus rhythm at a rate of 82 bpm with prolonged QTc 504 ms  Assessment/Plan Present on Admission:  Generalized abdominal pain  Parkinson's disease [facility])  Pacemaker - [employer] model L121 serial number [id number]  Paroxysmal atrial fibrillation (HCC)  SSS (sick sinus syndrome) ([place])  Principal Problem:   Generalized abdominal pain Active Problems:   BPH (benign prostatic hyperplasia)   Parkinson's disease (HCC)   SSS (sick sinus syndrome) ([place])   Pacemaker - [employer] model L121 serial number [id number]   Paroxysmal atrial fibrillation (HCC)   Diabetes mellitus type 2 in nonobese (HCC)   Generalized weakness   Dehydration   Hypokalemia   AKI (acute kidney injury) ([place])   UTI (urinary tract infection)   Prolonged QT interval   Lactic acidosis   Mixed hyperlipidemia   Diarrhea  Abdominal pain and Generalized weakness Continue IV NS at 75 mLs/Hr Abdominal pain has since improved Continue IV Zofran p.r.n. Continue clear liquid diet with plan to advanced diet as tolerated Continue fall precaution and neurochecks  Dehydration Acute kidney injury Creatinine 1.66 (baseline creatinine at 0.7-1.0) Continue IV hydration Renally adjust medications, avoid nephrotoxic agents/dehydration/hypotension  Hypokalemia K+ is 2.6 K+ will be replenished Please monitor for AM K+ for further replenishmemnt  Prolonged QTc- 504 ms Avoid QT prolonging drugs Magnesium level was 1.8 and was replenished Potassium was 2.6 and was replenished Continue telemetry and repeat EKG  Lactic acidosis possibly secondary to multifactorial Lactic acid 3.2 > 2.7; this may be due to dehydration, acute kidney injury Continue IV hydration and continue to trend lactic acid  Presumed UTI POA Patient complained of increased urinary frequency within last 1 to 2 weeks Urinalysis was suspicious for UTI He was started on IV ceftriaxone Urine  culture pending  Elevated troponin possibly due to type II demand ischemia Troponin x 2- 29 > 109 Dr. Percival Spanish (cardiology) thinks it's nonspecific, we shall continue to trend troponin and consult if clinically indicated  Acute diarrhea This appeared to have resolved yesterday Patient has not had any bowel movement since yesterday Continue to monitor for recurrence and consider C. diff and  GI stool panel test  Parkinson's disease Continue Sinemet  Sick sinus node dysfunction s/p cardiac pacemaker Stable, patient has blood pacemaker which was last reviewed on 04/13/2021 and showed normal functioning of the device  Hypothyroidism Continue Synthroid  T2DM Continue ISS and hypoglycemia protocol Metformin will be held at this time  Mixed hyperlipidemia Continue Zocor  BPH Continue Flomax  Paroxysmal A-fib on Xarelto Continue Xarelto Patient has a pacemaker  Depression Continue Zoloft   DVT prophylaxis: Xarelto  Code Status: Full code  Consults: None  Family Communication: Health aide at bedside (all questions answered to satisfaction)  Severity of Illness: The appropriate patient status for this patient is INPATIENT. Inpatient status is judged to be reasonable and necessary in order to provide the required intensity of service to ensure the patient's safety. The patient's presenting symptoms, physical exam findings, and initial radiographic and laboratory data in the context of their chronic comorbidities is felt to place them at high risk for further clinical deterioration. Furthermore, it is not anticipated that the patient will be medically stable for discharge from the hospital within 2 midnights of admission.   * I certify that at the point of admission it is my clinical judgment that the patient will require inpatient hospital care spanning beyond 2 midnights from the point of admission due to high intensity of service, high risk for further deterioration and high  frequency of surveillance required.*  Author: Bernadette Hoit, DO 12/06/2021 9:26 PM  For on call review [email or web address].

## 2021-12-07 ENCOUNTER — Inpatient Hospital Stay (HOSPITAL_COMMUNITY): Payer: Medicare Other

## 2021-12-07 DIAGNOSIS — N400 Enlarged prostate without lower urinary tract symptoms: Secondary | ICD-10-CM | POA: Diagnosis not present

## 2021-12-07 DIAGNOSIS — R778 Other specified abnormalities of plasma proteins: Secondary | ICD-10-CM | POA: Diagnosis not present

## 2021-12-07 DIAGNOSIS — I361 Nonrheumatic tricuspid (valve) insufficiency: Secondary | ICD-10-CM | POA: Diagnosis not present

## 2021-12-07 DIAGNOSIS — R1084 Generalized abdominal pain: Secondary | ICD-10-CM | POA: Diagnosis not present

## 2021-12-07 DIAGNOSIS — N179 Acute kidney failure, unspecified: Secondary | ICD-10-CM | POA: Diagnosis not present

## 2021-12-07 DIAGNOSIS — N390 Urinary tract infection, site not specified: Secondary | ICD-10-CM | POA: Diagnosis not present

## 2021-12-07 LAB — CBC
HCT: 29.2 % — ABNORMAL LOW (ref 39.0–52.0)
Hemoglobin: 10.4 g/dL — ABNORMAL LOW (ref 13.0–17.0)
MCH: 33.2 pg (ref 26.0–34.0)
MCHC: 35.6 g/dL (ref 30.0–36.0)
MCV: 93.3 fL (ref 80.0–100.0)
Platelets: 250 10*3/uL (ref 150–400)
RBC: 3.13 MIL/uL — ABNORMAL LOW (ref 4.22–5.81)
RDW: 15.4 % (ref 11.5–15.5)
WBC: 9.3 10*3/uL (ref 4.0–10.5)
nRBC: 0 % (ref 0.0–0.2)

## 2021-12-07 LAB — BASIC METABOLIC PANEL
Anion gap: 9 (ref 5–15)
BUN: 19 mg/dL (ref 8–23)
CO2: 21 mmol/L — ABNORMAL LOW (ref 22–32)
Calcium: 7.8 mg/dL — ABNORMAL LOW (ref 8.9–10.3)
Chloride: 110 mmol/L (ref 98–111)
Creatinine, Ser: 0.79 mg/dL (ref 0.61–1.24)
GFR, Estimated: >60 mL/min (ref >60)
Glucose, Bld: 174 mg/dL — ABNORMAL HIGH (ref 70–99)
Potassium: 3.5 mmol/L (ref 3.5–5.1)
Sodium: 140 mmol/L (ref 135–145)

## 2021-12-07 LAB — COMPREHENSIVE METABOLIC PANEL
ALT: 6 U/L (ref 0–44)
AST: 19 U/L (ref 15–41)
Albumin: 2.9 g/dL — ABNORMAL LOW (ref 3.5–5.0)
Alkaline Phosphatase: 48 U/L (ref 38–126)
Anion gap: 10 (ref 5–15)
BUN: 34 mg/dL — ABNORMAL HIGH (ref 8–23)
CO2: 22 mmol/L (ref 22–32)
Calcium: 8.4 mg/dL — ABNORMAL LOW (ref 8.9–10.3)
Chloride: 109 mmol/L (ref 98–111)
Creatinine, Ser: 0.97 mg/dL (ref 0.61–1.24)
GFR, Estimated: >60 mL/min (ref >60)
Glucose, Bld: 93 mg/dL (ref 70–99)
Potassium: 2.8 mmol/L — ABNORMAL LOW (ref 3.5–5.1)
Sodium: 141 mmol/L (ref 135–145)
Total Bilirubin: 0.4 mg/dL (ref 0.3–1.2)
Total Protein: 5.9 g/dL — ABNORMAL LOW (ref 6.5–8.1)

## 2021-12-07 LAB — URINE CULTURE: Culture: >=100000 — AB

## 2021-12-07 LAB — GLUCOSE, CAPILLARY
Glucose-Capillary: 75 mg/dL (ref 70–99)
Glucose-Capillary: 140 mg/dL — ABNORMAL HIGH (ref 70–99)

## 2021-12-07 LAB — MAGNESIUM: Magnesium: 2 mg/dL (ref 1.7–2.4)

## 2021-12-07 LAB — LACTIC ACID, PLASMA: Lactic Acid, Venous: 1.2 mmol/L (ref 0.5–1.9)

## 2021-12-07 LAB — ECHOCARDIOGRAM COMPLETE
Area-P 1/2: 2.68 cm2
S' Lateral: 3.7 cm

## 2021-12-07 LAB — PHOSPHORUS: Phosphorus: 1.7 mg/dL — ABNORMAL LOW (ref 2.5–4.6)

## 2021-12-07 LAB — TROPONIN I (HIGH SENSITIVITY): Troponin I (High Sensitivity): 340 ng/L (ref <18)

## 2021-12-07 LAB — CBG MONITORING, ED
Glucose-Capillary: 82 mg/dL (ref 70–99)
Glucose-Capillary: 154 mg/dL — ABNORMAL HIGH (ref 70–99)

## 2021-12-07 LAB — MRSA NEXT GEN BY PCR, NASAL: MRSA by PCR Next Gen: NOT DETECTED

## 2021-12-07 MED ORDER — IBUPROFEN 400 MG PO TABS
400.0000 mg | ORAL_TABLET | ORAL | Status: DC | PRN
  Administered 2021-12-07: 400 mg via ORAL
  Filled 2021-12-07: qty 1

## 2021-12-07 MED ORDER — POTASSIUM PHOSPHATES 15 MMOLE/5ML IV SOLN
30.0000 mmol | Freq: Once | INTRAVENOUS | Status: AC
  Administered 2021-12-07: 30 mmol via INTRAVENOUS
  Filled 2021-12-07: qty 10

## 2021-12-07 MED ORDER — LORAZEPAM 2 MG/ML IJ SOLN: 0.5000 mg | Freq: Four times a day (QID) | INTRAMUSCULAR | Status: DC | PRN

## 2021-12-07 MED ORDER — MELATONIN 5 MG PO TABS
10.0000 mg | ORAL_TABLET | Freq: Every day | ORAL | Status: DC
  Administered 2021-12-07 – 2021-12-08 (×2): 10 mg via ORAL
  Filled 2021-12-07 (×2): qty 2

## 2021-12-07 MED ORDER — POTASSIUM CHLORIDE CRYS ER 20 MEQ PO TBCR
40.0000 meq | EXTENDED_RELEASE_TABLET | ORAL | Status: AC
  Administered 2021-12-07 (×2): 40 meq via ORAL
  Filled 2021-12-07 (×2): qty 2

## 2021-12-07 MED ORDER — ACETAMINOPHEN 325 MG PO TABS: 650.0000 mg | ORAL_TABLET | Freq: Four times a day (QID) | ORAL | Status: DC | PRN

## 2021-12-07 MED ORDER — SODIUM CHLORIDE 0.9 % IV SOLN
2.0000 g | INTRAVENOUS | Status: DC
  Administered 2021-12-07 – 2021-12-08 (×2): 2 g via INTRAVENOUS
  Filled 2021-12-07 (×2): qty 20

## 2021-12-07 NOTE — Progress Notes (Signed)
PROGRESS NOTE        PATIENT DETAILS Name: Stephen Robbins Age: 86 y.o. Sex: male Date of Birth: 19-Mar-1936 Admit Date: 12/06/2021 Admitting Physician Bernadette Hoit, DO KGU:RKYHCW, Christian Mate, MD  Brief Summary: Patient is a 86 y.o.  male with history of Parkinson's disease-sick sinus syndrome s/p PPM implantation, DM-2, HLD, BPH, PAF on Xarelto who presented from his assisted living facility with weakness/confusion and lower abdominal pain-patient was found to have complicated UTI and subsequently admitted to the hospitalist service.  See below for further details.  Significant events: 9/14>> admit to [facility]-complicated UTI.  Significant studies: 9/14>> CXR: No PNA 9/14>> CT abdomen/pelvis: Tiny nonobstructing renal calculi, bladder wall thickening, several 6 mm groundglass opacity left lower lobe  Significant microbiology data: 9/14>> blood culture: No growth 9/14>> urine culture: Pending  Procedures: None  Consults: None  Subjective: Frail-appearing but feels better than yesterday.  No abdominal pain.  Objective: Vitals: Blood pressure 113/64, pulse 73, temperature 98.1 F (36.7 C), temperature source Oral, resp. rate (!) 21, SpO2 100 %.   Exam: Gen Exam: Frail-but not in distress. HEENT:atraumatic, normocephalic Chest: B/L clear to auscultation anteriorly CVS:S1S2 regular Abdomen:soft non tender, non distended Extremities:no edema Neurology: Non focal-but with generalized weakness. Skin: no rash  Pertinent Labs/Radiology:    Latest Ref Rng & Units 12/07/2021   12:00 AM 12/06/2021    9:09 PM 12/06/2021   12:57 PM  CBC  WBC 4.0 - 10.5 K/uL 9.3  8.1  11.1   Hemoglobin 13.0 - 17.0 g/dL 10.4  10.8  12.3   Hematocrit 39.0 - 52.0 % 29.2  30.1  34.5   Platelets 150 - 400 K/uL 250  256  296     Lab Results  Component Value Date   NA 141 12/07/2021   K 2.8 (L) 12/07/2021   CL 109 12/07/2021   CO2 22 12/07/2021       Assessment/Plan: Sepsis due to complicated UTI (POA): Sepsis physiology improved-continue IV Rocephin-follow cultures.  Frequent bladder scans to ensure he is not retaining.  Lower abdominal pain: Suspect due to UTI-need to ensure he is not retaining urine-frequent bladder scans.  CT abdomen nonacute.  AKI: Hemodynamically mediated-resolved.  Hypokalemia: Replete and recheck.  Hypophosphatemia: Replete and recheck.  Minimally elevated troponin: Admitting MD discussed with cardiology-nonspecific elevation-unclear significance.  He has no chest pain.  Suspect this is demand ischemia.  Check echo.  Given advanced age/frailty-we will manage this medically.  PAF: Continue Xarelto-monitor on telemetry-PPM in place.  History of sick sinus syndrome: PPM in place.  Prolonged QTc: Wide bundle at baseline-continue to replete potassium-monitor on telemetry-recheck twelve-lead EKG tomorrow morning.  Parkinson's disease: Continue Sinemet.  Hypothyroidism: Continue Synthroid.  Recent TSH on 8/30 stable.  DM-2 (A1c 6.19/14): Continue SSI-hold all oral hypoglycemics.  HLD: Continue Zocor  BPH: Continue Flomax  Depression: Stable-continue Zoloft  Chronic debility/deconditioning: Lives at [facility] 24/7 caregiver-walks with cane at baseline-obtaining PT/OT eval.  6 mm left lower lobe groundglass opacity/nodule: Incidental finding on CT abdomen-repeat CT imaging in 3 to 6 months.  BMI: Estimated body mass index is 23.88 kg/m as calculated from the following:   Height as of 03/19/21: '5\' 6"'$  (1.676 m).   Weight as of 03/19/21: 67.1 kg.   Code status:   Code Status: Full Code   DVT Prophylaxis: rivaroxaban (XARELTO) tablet 20 mg Start: 12/07/21  1700 SCDs Start: 12/06/21 2001 rivaroxaban (XARELTO) tablet 20 mg     Family Communication: Niece-Lisa-[phone]-updated over the phone 9/15   Disposition Plan: Status is: Inpatient Remains inpatient appropriate because: Severity of  illness-sepsis due to complicated UTI-not yet stable for discharge.   Planned Discharge Destination: ALF versus SNF   Diet: Diet Order             Diet clear liquid Room service appropriate? Yes; Fluid consistency: Thin  Diet effective now                     Antimicrobial agents: Anti-infectives (From admission, onward)    Start     Dose/Rate Route Frequency Ordered Stop   12/07/21 2115  cefTRIAXone (ROCEPHIN) 2 g in sodium chloride 0.9 % 100 mL IVPB        2 g 200 mL/hr over 30 Minutes Intravenous Every 24 hours 12/07/21 0651     12/06/21 2115  cefTRIAXone (ROCEPHIN) 1 g in sodium chloride 0.9 % 100 mL IVPB  Status:  Discontinued        1 g 200 mL/hr over 30 Minutes Intravenous Every 24 hours 12/06/21 2102 12/07/21 0651   12/06/21 2100  ciprofloxacin (CIPRO) IVPB 400 mg  Status:  Discontinued        400 mg 200 mL/hr over 60 Minutes Intravenous Every 12 hours 12/06/21 2047 12/06/21 2102        MEDICATIONS: Scheduled Meds:  carbidopa-levodopa  2 tablet Oral TID   insulin aspart  0-9 Units Subcutaneous TID WC   levothyroxine  75 mcg Oral Q0600   potassium chloride  40 mEq Oral Q4H   rivaroxaban  20 mg Oral Q supper   sertraline  100 mg Oral Daily   simvastatin  40 mg Oral QHS   tamsulosin  0.4 mg Oral Daily   Continuous Infusions:  cefTRIAXone (ROCEPHIN)  IV     potassium PHOSPHATE IVPB (in mmol) 30 mmol (12/07/21 0850)   PRN Meds:.ondansetron (ZOFRAN) IV   I have personally reviewed following labs and imaging studies  LABORATORY DATA: CBC: Recent Labs  Lab 12/06/21 1257 12/06/21 2109 12/07/21 0000  WBC 11.1* 8.1 9.3  NEUTROABS 9.4*  --   --   HGB 12.3* 10.8* 10.4*  HCT 34.5* 30.1* 29.2*  MCV 93.0 92.3 93.3  PLT 296 256 010    Basic Metabolic Panel: Recent Labs  Lab 12/06/21 1257 12/06/21 1536 12/06/21 2109 12/07/21 0000  NA 141  --   --  141  K 2.6*  --   --  2.8*  CL 106  --   --  109  CO2 21*  --   --  22  GLUCOSE 139*  --   --  93   BUN 46*  --   --  34*  CREATININE 1.66*  --  1.01 0.97  CALCIUM 9.5  --   --  8.4*  MG  --  1.8  --  2.0  PHOS  --   --   --  1.7*    GFR: CrCl cannot be calculated (Unknown ideal weight.).  Liver Function Tests: Recent Labs  Lab 12/06/21 1257 12/07/21 0000  AST 21 19  ALT 9 6  ALKPHOS 59 48  BILITOT 1.1 0.4  PROT 7.7 5.9*  ALBUMIN 3.7 2.9*   Recent Labs  Lab 12/06/21 1257  LIPASE 18   No results for input(s): "AMMONIA" in the last 168 hours.  Coagulation Profile: No results for input(s): "  INR", "PROTIME" in the last 168 hours.  Cardiac Enzymes: No results for input(s): "CKTOTAL", "CKMB", "CKMBINDEX", "TROPONINI" in the last 168 hours.  BNP (last 3 results) No results for input(s): "PROBNP" in the last 8760 hours.  Lipid Profile: No results for input(s): "CHOL", "HDL", "LDLCALC", "TRIG", "CHOLHDL", "LDLDIRECT" in the last 72 hours.  Thyroid Function Tests: No results for input(s): "TSH", "T4TOTAL", "FREET4", "T3FREE", "THYROIDAB" in the last 72 hours.  Anemia Panel: No results for input(s): "VITAMINB12", "FOLATE", "FERRITIN", "TIBC", "IRON", "RETICCTPCT" in the last 72 hours.  Urine analysis:    Component Value Date/Time   COLORURINE YELLOW 12/06/2021 1537   APPEARANCEUR CLOUDY (A) 12/06/2021 1537   LABSPEC 1.015 12/06/2021 1537   PHURINE 5.0 12/06/2021 1537   GLUCOSEU 150 (A) 12/06/2021 1537   GLUCOSEU NEGATIVE 09/26/2009 0859   HGBUR MODERATE (A) 12/06/2021 1537   HGBUR negative 02/03/2007 0755   BILIRUBINUR NEGATIVE 12/06/2021 1537   BILIRUBINUR n 11/19/2012 1026   KETONESUR NEGATIVE 12/06/2021 1537   PROTEINUR 30 (A) 12/06/2021 1537   UROBILINOGEN 0.2 11/19/2012 1026   UROBILINOGEN 2.0 (H) 08/02/2012 1503   NITRITE NEGATIVE 12/06/2021 1537   LEUKOCYTESUR LARGE (A) 12/06/2021 1537    Sepsis Labs: Lactic Acid, Venous    Component Value Date/Time   LATICACIDVEN 1.2 12/07/2021 0000    MICROBIOLOGY: Recent Results (from the past 240  hour(s))  Culture, blood (routine x 2)     Status: None (Preliminary result)   Collection Time: 12/06/21 12:59 PM   Specimen: BLOOD  Result Value Ref Range Status   Specimen Description BLOOD SITE NOT SPECIFIED  Final   Special Requests   Final    BOTTLES DRAWN AEROBIC AND ANAEROBIC Blood Culture adequate volume   Culture   Final    NO GROWTH < 12 HOURS Performed at [facility], [address] [place], [place] [address]    Report Status PENDING  Incomplete    RADIOLOGY STUDIES/RESULTS: CT ABDOMEN PELVIS WO CONTRAST  Result Date: 12/06/2021 CLINICAL DATA:  Abdominal pain, weakness, in GI upset for 1 week increased in past 2 days, near syncopal episode this morning while ambulating. Past history diabetes mellitus, former smoker, arrhythmia EXAM: CT ABDOMEN AND PELVIS WITHOUT CONTRAST TECHNIQUE: Multidetector CT imaging of the abdomen and pelvis was performed following the standard protocol without IV contrast. RADIATION DOSE REDUCTION: This exam was performed according to the departmental dose-optimization program which includes automated exposure control, adjustment of the mA and/or kV according to patient size and/or use of iterative reconstruction technique. COMPARISON:  None Available. FINDINGS: Lower chest: Pacemaker leads RIGHT atrium and RIGHT ventricle. Several foci of minimal ground-glass opacity centrally in LEFT lower lobe, largest 6 mm. RIGHT lung base clear. Hepatobiliary: Gallbladder and liver normal appearance Pancreas: Fatty replacement of pancreas. Few calcifications at pancreatic head and uncinate. Spleen: Normal appearance Adrenals/Urinary Tract: Adrenal glands normal appearance. Tiny nonobstructing renal calculi. No renal mass, hydronephrosis or hydroureter. Bladder wall thickening question chronic outlet obstruction. Stomach/Bowel: Appendix not definitely visualized. Small amount of fluid in rectum. Stomach and bowel loops otherwise normal appearance. Vascular/Lymphatic:  Atherosclerotic calcifications aorta and iliac arteries without aneurysm. No adenopathy. Reproductive: Mild prostatic enlargement Other: No free air or free fluid. No hernia or inflammatory process. Musculoskeletal: Unremarkable IMPRESSION: Tiny nonobstructing renal calculi. Bladder wall thickening question chronic outlet obstruction secondary to mild prostatic enlargement. Several foci of minimal ground-glass opacity centrally in LEFT lower lobe, largest 6 mm, nonspecific; follow-up CT imaging is recommended at 3-6 months to determine  persistence. Aortic Atherosclerosis (ICD10-I70.0). Electronically Signed   By: Lavonia Dana M.D.   On: 12/06/2021 17:35   DG Chest Port 1 View  Result Date: 12/06/2021 CLINICAL DATA:  Weakness EXAM: PORTABLE CHEST 1 VIEW COMPARISON:  02/08/2014 FINDINGS: No focal consolidation. No pleural effusion or pneumothorax. Heart and mediastinal contours are unremarkable. Dual lead cardiac pacemaker. No acute osseous abnormality. IMPRESSION: No active disease. Electronically Signed   By: Kathreen Devoid M.D.   On: 12/06/2021 13:50     LOS: 1 day   Oren Binet, MD  [facility]    To contact the attending provider between 7A-7P or the covering provider during after hours 7P-7A, please log into the web site [email or web address] and access using universal [place] password for that web site. If you do not have the password, please call the hospital operator.  12/07/2021, 11:08 AM

## 2021-12-07 NOTE — ED Notes (Signed)
Full linen change provided at this time, patient rotated onto left hip to alleviate pressure on sacrum

## 2021-12-07 NOTE — Evaluation (Signed)
Occupational Therapy Evaluation Patient Details Name: Stephen Robbins MRN: [id number] DOB: 1935-10-18 Today's Date: 12/07/2021   History of Present Illness 86 yo male admitted with abdominal pain and weakness  pMH Parkinson, sick sinus node dysfunction s/p cardiac pacemaker, Dm2, HLD BPH Afib on xarelto   Clinical Impression   Pt from [facility] with paid caregivers at bedside that (A) at baseline. Pt currently stand pivots to w/c daily with caregivers. Pt with increased weakness making transfers increased difficult compared to baseline. Recommend use of stedy or hoyer with RN staff due to fall risk. Pt noted to have redness at sacrum at this time with barrier cream and sacral pad placed. Caregiver present with supplied diapers from home. Pt unaware of incontinence. Recommendation for [facility] pending facility level to provide care upon d/c. If paid caregivers can continue to manage can return to prior setup.     *call me Stephen Robbins   Recommendations for follow up therapy are one component of a multi-disciplinary discharge planning process, led by the attending physician.  Recommendations may be updated based on patient status, additional functional criteria and insurance authorization.   Follow Up Recommendations  Skilled nursing-short term rehab (<3 hours/day)    Assistance Recommended at Discharge Intermittent Supervision/Assistance  Patient can return home with the following Two people to help with bathing/dressing/bathroom    Functional Status Assessment  Patient has had a recent decline in their functional status and demonstrates the ability to make significant improvements in function in a reasonable and predictable amount of time.  Equipment Recommendations  None recommended by OT (has DME)    Recommendations for Other Services       Precautions / Restrictions Precautions Precautions: Fall      Mobility Bed Mobility Overal bed mobility: Needs Assistance Bed Mobility: Rolling, Supine to  Sit, Sit to Supine Rolling: Max assist   Supine to sit: Max assist Sit to supine: Max assist   General bed mobility comments: pt unable to sustain sitting eob without mod (A) . pt with strong posterior lean .    Transfers Overall transfer level: Needs assistance Equipment used: 1 person hand held assist Transfers: Sit to/from Stand Sit to Stand: Max assist           General transfer comment: pt with bil LE in extension and posterior lean. pt could benefit from use of stedy to prevent fall. pt required bil le blocking due to fall risk      Balance Overall balance assessment: Needs assistance Sitting-balance support: Bilateral upper extremity supported, Feet supported Sitting balance-Leahy Scale: Poor     Standing balance support: Bilateral upper extremity supported, During functional activity Standing balance-Leahy Scale: Zero                             ADL either performed or assessed with clinical judgement   ADL Overall ADL's : Needs assistance/impaired Eating/Feeding: Maximal assistance   Grooming: Maximal assistance       Lower Body Bathing: Maximal assistance Lower Body Bathing Details (indicate cue type and reason): incontinence of bladder with new brief applied Upper Body Dressing : Maximal assistance   Lower Body Dressing: Total assistance                 General ADL Comments: pt noted to have red buttocks, hygiene provided due to incontnence of bladder wtih barrier cream and foam pad applied. Caregiver reports diarrhea and redness from that prior to this admission  Vision Patient Visual Report: No change from baseline       Perception     Praxis      Pertinent Vitals/Pain Pain Assessment Pain Assessment: No/denies pain     Hand Dominance Right   Extremity/Trunk Assessment Upper Extremity Assessment Upper Extremity Assessment: Generalized weakness   Lower Extremity Assessment Lower Extremity Assessment: Defer to PT  evaluation   Cervical / Trunk Assessment Cervical / Trunk Assessment: Kyphotic   Communication Communication Communication: No difficulties   Cognition Arousal/Alertness: Awake/alert Behavior During Therapy: Flat affect Overall Cognitive Status: Impaired/Different from baseline Area of Impairment: Orientation, Memory                 Orientation Level: Disoriented to, Situation, Time   Memory: Decreased short-term memory         General Comments: caregiver reports not back to baseline yet with cognition. pt oriented to self and location at Hospital. pt reports being here because he is sick but unable to give further details. pt vague in responses.     General Comments  HR 120 with sit<>stand    Exercises     Shoulder Instructions      Home Living Family/patient expects to be discharged to:: Assisted living                                 Additional Comments: Noted from [facility] with paid 24/7 caregivers at facility. pt normally oob to w/c with stand pivot. pt requires (A) for balance with static sitting.      Prior Functioning/Environment               Mobility Comments: w/c only ADLs Comments: total (A)        OT Problem List: Decreased strength;Decreased activity tolerance;Impaired balance (sitting and/or standing);Decreased coordination;Decreased cognition;Decreased safety awareness;Decreased knowledge of use of DME or AE;Decreased knowledge of precautions;Cardiopulmonary status limiting activity      OT Treatment/Interventions: Self-care/ADL training;Therapeutic exercise;Neuromuscular education;Energy conservation;DME and/or AE instruction;Manual therapy;Modalities;Therapeutic activities;Cognitive remediation/compensation;Patient/family education;Balance training    OT Goals(Current goals can be found in the care plan section) Acute Rehab OT Goals Patient Stated Goal: to talk to his friend Stephen Robbins OT Goal Formulation: Patient unable to  participate in goal setting Time For Goal Achievement: 12/21/21 Potential to Achieve Goals: Good  OT Frequency: Min 2X/week    Co-evaluation              AM-PAC OT "6 Clicks" Daily Activity     Outcome Measure Help from another person eating meals?: A Lot Help from another person taking care of personal grooming?: A Lot Help from another person toileting, which includes using toliet, bedpan, or urinal?: Total Help from another person bathing (including washing, rinsing, drying)?: Total Help from another person to put on and taking off regular upper body clothing?: A Lot Help from another person to put on and taking off regular lower body clothing?: Total 6 Click Score: 9   End of Session Equipment Utilized During Treatment: Gait belt Nurse Communication: Mobility status;Precautions;Other (comment) (stedy or hoyer)  Activity Tolerance: Patient tolerated treatment well Patient left: in bed;with call bell/phone within reach;with family/visitor present  OT Visit Diagnosis: Unsteadiness on feet (R26.81);Muscle weakness (generalized) (M62.81)                Time: 6045-4098 OT Time Calculation (min): 21 min Charges:  OT General Charges $OT Visit: 1 Visit OT Evaluation $OT Eval Moderate Complexity:  1 Mod   Brynn, OTR/L  Acute Rehabilitation Services Office: [phone] .   Jeri Modena 12/07/2021, 10:40 AM

## 2021-12-07 NOTE — Evaluation (Signed)
Clinical/Bedside Swallow Evaluation Patient Details  Name: Stephen Robbins MRN: [id number] Date of Birth: [birth year]  Today's Date: 12/07/2021 Time: SLP Start Time (ACUTE ONLY): 7169 SLP Stop Time (ACUTE ONLY): 6789 SLP Time Calculation (min) (ACUTE ONLY): 14 min  Past Medical History:  Past Medical History:  Diagnosis Date   Arthritis    "joints; a little" (01/04/2014)   Asymptomatic bilateral carotid artery stenosis    per duplex  05-15-2012  left >39%/   right 40-59%   Basal cell carcinoma    nose   Borderline diabetes    BPH (benign prostatic hypertrophy) with urinary obstruction    Bradycardia    Bronchial pneumonia 1958   Chronotropic incompetence with sinus node dysfunction (HCC)    Compressed cervical disc    Compression of lumbar vertebra (HCC)    L4 -- L5   Dizzy    Dysmetabolic syndrome    Fatigue    Frequency of urination    GERD (gastroesophageal reflux disease)    occasional   Hemorrhoids    Hyperlipidemia    Hypothyroidism    Nocturia    NSVT (nonsustained ventricular tachycardia) [facility])    cardiologist-  dr croitoru   Pacemaker    Urgency of urination    Wears glasses    Past Surgical History:  Past Surgical History:  Procedure Laterality Date   BASAL CELL CARCINOMA EXCISION     nose   CLOSED REDUCTION NASAL FRACTURE  09-01-2007   CYSTOSCOPY N/A 12/28/2012   Procedure: CYSTOSCOPY;  Surgeon: Bernestine Amass, MD;  Location: [facility];  Service: Urology;  Laterality: N/A;   EXERCISE TOLERENCE TEST  12-03-2012  DR CROITURO   CHRONOTROPIC INCOMPETENCE/ NORMAL RESTING BP W/ APPROPRIATE RESPONSE/ NO CHEST PAIN/ NO ST CHANGES FROM BASELINE   INSERT / REPLACE / REMOVE PACEMAKER  01/04/2014   [employer] model L121 serial number [id number]   LACERATION REPAIR Right 1978   middle finger   NEUROPLASTY / TRANSPOSITION ULNAR NERVE AT ELBOW Left 02-09-2010   PERMANENT PACEMAKER INSERTION N/A 01/04/2014   Procedure: PERMANENT PACEMAKER  INSERTION;  Surgeon: Sanda Klein, MD;  Location: [facility];  Service: Cardiovascular;  Laterality: N/A;   SKIN BIOPSY     scc   TONSILLECTOMY AND ADENOIDECTOMY  1944   TRANSURETHRAL INCISION OF PROSTATE N/A 12/28/2012   Procedure: TRANSURETHRAL INCISION OF THE PROSTATE (TUIP);  Surgeon: Bernestine Amass, MD;  Location: [facility];  Service: Urology;  Laterality: N/A;   ULNAR NERVE TRANSPOSITION     HPI:  86 yo male admitted with abdominal pain and weakness  pMH Parkinson, sick sinus node dysfunction s/p cardiac pacemaker, Dm2, HLD BPH Afib on xarelto    Assessment / Plan / Recommendation  Clinical Impression  Pt seen for swallow assessment with caregiver at bedside who reports coughing with po's has increased past 2 weeks. His vocal intensity is low with general facial/lingual weakness. Indications of compromised airway include immediate cough and throat clear following straw sips thin liquid and with jello. He would benefit from instrumental assessment of swallow ability. Recommend, per GI, clear liquids with liquids thickened to nectar consistency, crush meds, full supervision and MBS. SLP Visit Diagnosis: Dysphagia, unspecified (R13.10)    Aspiration Risk  Mild aspiration risk;Moderate aspiration risk    Diet Recommendation Nectar-thick liquid;Other (Comment) (clear liquid per MD)   Liquid Administration via: Straw;Cup Medication Administration: Crushed with puree Supervision: Staff to assist with self feeding;Full supervision/cueing for compensatory strategies Compensations:  Slow rate;Small sips/bites Postural Changes: Seated upright at 90 degrees    Other  Recommendations Oral Care Recommendations: Oral care BID    Recommendations for follow up therapy are one component of a multi-disciplinary discharge planning process, led by the attending physician.  Recommendations may be updated based on patient status, additional functional criteria and insurance  authorization.  Follow up Recommendations  (TBD)      Assistance Recommended at Discharge    Functional Status Assessment Patient has had a recent decline in their functional status and demonstrates the ability to make significant improvements in function in a reasonable and predictable amount of time.  Frequency and Duration min 2x/week  2 weeks       Prognosis Prognosis for Safe Diet Advancement: Fair      Swallow Study   General Date of Onset: 12/06/21 HPI: 86 yo male admitted with abdominal pain and weakness  pMH Parkinson, sick sinus node dysfunction s/p cardiac pacemaker, Dm2, HLD BPH Afib on xarelto Type of Study: Bedside Swallow Evaluation Previous Swallow Assessment:  (not for swallow) Diet Prior to this Study: Thin liquids (clear liquids) Temperature Spikes Noted: No Respiratory Status: Room air History of Recent Intubation: No Behavior/Cognition: Alert;Cooperative;Requires cueing Oral Cavity Assessment: Within Functional Limits Oral Care Completed by SLP: No Oral Cavity - Dentition: Adequate natural dentition Vision: Functional for self-feeding Self-Feeding Abilities: Needs assist Patient Positioning: Partially reclined (after repositioning) Baseline Vocal Quality: Low vocal intensity Volitional Cough: Weak    Oral/Motor/Sensory Function Overall Oral Motor/Sensory Function: Generalized oral weakness   Ice Chips Ice chips: Not tested   Thin Liquid Thin Liquid: Impaired Presentation: Straw Pharyngeal  Phase Impairments: Cough - Immediate;Throat Clearing - Immediate    Nectar Thick Nectar Thick Liquid: Not tested   Honey Thick Honey Thick Liquid: Not tested   Puree Puree: Within functional limits   Solid     Solid: Not tested (on clear liquids)      [name], Orbie Pyo 12/07/2021,2:35 PM

## 2021-12-07 NOTE — Progress Notes (Signed)
  Echocardiogram 2D Echocardiogram has been performed.  Stephen Robbins 12/07/2021, 3:08 PM

## 2021-12-08 ENCOUNTER — Inpatient Hospital Stay (HOSPITAL_COMMUNITY): Payer: Medicare Other

## 2021-12-08 DIAGNOSIS — R1084 Generalized abdominal pain: Secondary | ICD-10-CM | POA: Diagnosis not present

## 2021-12-08 DIAGNOSIS — N400 Enlarged prostate without lower urinary tract symptoms: Secondary | ICD-10-CM | POA: Diagnosis not present

## 2021-12-08 DIAGNOSIS — N179 Acute kidney failure, unspecified: Secondary | ICD-10-CM | POA: Diagnosis not present

## 2021-12-08 DIAGNOSIS — N390 Urinary tract infection, site not specified: Secondary | ICD-10-CM | POA: Diagnosis not present

## 2021-12-08 LAB — PHOSPHORUS: Phosphorus: 2.2 mg/dL — ABNORMAL LOW (ref 2.5–4.6)

## 2021-12-08 LAB — CBC
HCT: 30.4 % — ABNORMAL LOW (ref 39.0–52.0)
Hemoglobin: 10.7 g/dL — ABNORMAL LOW (ref 13.0–17.0)
MCH: 33 pg (ref 26.0–34.0)
MCHC: 35.2 g/dL (ref 30.0–36.0)
MCV: 93.8 fL (ref 80.0–100.0)
Platelets: 236 10*3/uL (ref 150–400)
RBC: 3.24 MIL/uL — ABNORMAL LOW (ref 4.22–5.81)
RDW: 15.8 % — ABNORMAL HIGH (ref 11.5–15.5)
WBC: 9.2 10*3/uL (ref 4.0–10.5)
nRBC: 0 % (ref 0.0–0.2)

## 2021-12-08 LAB — BASIC METABOLIC PANEL
Anion gap: 10 (ref 5–15)
BUN: 11 mg/dL (ref 8–23)
CO2: 22 mmol/L (ref 22–32)
Calcium: 8.4 mg/dL — ABNORMAL LOW (ref 8.9–10.3)
Chloride: 111 mmol/L (ref 98–111)
Creatinine, Ser: 0.55 mg/dL — ABNORMAL LOW (ref 0.61–1.24)
GFR, Estimated: >60 mL/min (ref >60)
Glucose, Bld: 132 mg/dL — ABNORMAL HIGH (ref 70–99)
Potassium: 4.2 mmol/L (ref 3.5–5.1)
Sodium: 143 mmol/L (ref 135–145)

## 2021-12-08 LAB — GLUCOSE, CAPILLARY
Glucose-Capillary: 94 mg/dL (ref 70–99)
Glucose-Capillary: 115 mg/dL — ABNORMAL HIGH (ref 70–99)
Glucose-Capillary: 113 mg/dL — ABNORMAL HIGH (ref 70–99)
Glucose-Capillary: 142 mg/dL — ABNORMAL HIGH (ref 70–99)

## 2021-12-08 LAB — MAGNESIUM: Magnesium: 1.3 mg/dL — ABNORMAL LOW (ref 1.7–2.4)

## 2021-12-08 NOTE — TOC Progression Note (Signed)
Transition of Care [facility]) - Progression Note    Patient Details  Name: Stephen Robbins MRN: [id number] Date of Birth: 1936/03/09  Transition of Care [facility]) CM/SW Contact  Bartholomew Crews, RN Phone Number: [phone] 12/08/2021, 11:33 AM  Clinical Narrative:     Received call from Patrick Jupiter at [facility]. Patient is from SNF area of facility, and bed is available for him when medical ready.        Expected Discharge Plan and Services                                                 Social Determinants of Health (SDOH) Interventions    Readmission Risk Interventions     No data to display

## 2021-12-08 NOTE — Evaluation (Signed)
Physical Therapy Evaluation Patient Details Name: Stephen Robbins MRN: [id number] DOB: 08-03-1935 Today's Date: 12/08/2021  History of Present Illness  86 yo male admitted with abdominal pain and weakness  pMH Parkinson, sick sinus node dysfunction s/p cardiac pacemaker, Dm2, HLD BPH Afib on xarelto  Clinical Impression  Pt admitted with above diagnosis. Pt received in bed, writhing and saying he wanted to get up. Pt required max A to come to EOB and min to mod A to maintain sitting EOB with LOB in all directions. Pt stood with stedy and mod A for transfer to recliner. Caregiver reports that current function is a decline for him and he usually only needs min A to pivot to w/c. Will follow acutely.  Pt currently with functional limitations due to the deficits listed below (see PT Problem List). Pt will benefit from skilled PT to increase their independence and safety with mobility to allow discharge to the venue listed below.          Recommendations for follow up therapy are one component of a multi-disciplinary discharge planning process, led by the attending physician.  Recommendations may be updated based on patient status, additional functional criteria and insurance authorization.  Follow Up Recommendations Home health PT      Assistance Recommended at Discharge Frequent or constant Supervision/Assistance  Patient can return home with the following  A lot of help with bathing/dressing/bathroom;A lot of help with walking and/or transfers;Direct supervision/assist for medications management;Direct supervision/assist for financial management;Assist for transportation    Equipment Recommendations None recommended by PT  Recommendations for Other Services       Functional Status Assessment Patient has had a recent decline in their functional status and demonstrates the ability to make significant improvements in function in a reasonable and predictable amount of time.     Precautions /  Restrictions Precautions Precautions: Fall Restrictions Weight Bearing Restrictions: No      Mobility  Bed Mobility Overal bed mobility: Needs Assistance Bed Mobility: Supine to Sit     Supine to sit: Max assist     General bed mobility comments: max to LE's as well as trunk for elevation into sitting    Transfers Overall transfer level: Needs assistance Equipment used: Ambulation equipment used Transfers: Sit to/from Stand, Bed to chair/wheelchair/BSC Sit to Stand: Mod assist           General transfer comment: mod A for sit>stand with pt pulling on bar of stedy which is familiar to him as he has a bar in his bathroom. Transfer via Lift Equipment: Stedy  Ambulation/Gait               General Gait Details: pt w/c bound  Financial trader Rankin (Stroke Patients Only)       Balance Overall balance assessment: Needs assistance Sitting-balance support: Bilateral upper extremity supported, Feet supported Sitting balance-Leahy Scale: Poor Sitting balance - Comments: needed min to mod A to maintain sitting EOB, LOB in all directions   Standing balance support: Bilateral upper extremity supported, During functional activity Standing balance-Leahy Scale: Zero Standing balance comment: mod A to maintain standing                             Pertinent Vitals/Pain Pain Assessment Pain Assessment: Faces Faces Pain Scale: Hurts little more Pain Location: generalized to touch Pain Descriptors / Indicators:  Sore Pain Intervention(s): Limited activity within patient's tolerance, Monitored during session    Home Living Family/patient expects to be discharged to:: Assisted living                   Additional Comments: Noted from [facility] with paid 24/7 caregivers at facility. pt normally oob to w/c with stand pivot. pt requires (A) for balance with static sitting.    Prior Function Prior Level of  Function : Needs assist             Mobility Comments: w/c only due to falls but caregiver reports min A with transfers to w/c ADLs Comments: total (A)     Hand Dominance   Dominant Hand: Right    Extremity/Trunk Assessment   Upper Extremity Assessment Upper Extremity Assessment: Defer to OT evaluation    Lower Extremity Assessment Lower Extremity Assessment: Generalized weakness;RLE deficits/detail RLE Deficits / Details: increased stiffness BLE's as well as writhing motion in bed and chair, caregiver reports this is close to his baseline but worse    Cervical / Trunk Assessment Cervical / Trunk Assessment: Kyphotic  Communication   Communication: Expressive difficulties  Cognition Arousal/Alertness: Awake/alert Behavior During Therapy: Flat affect Overall Cognitive Status: Difficult to assess                                 General Comments: difficult to assess today due to minimal verbalization of pt. Caregiver reports this is not his basline        General Comments General comments (skin integrity, edema, etc.): worked on stepping feet in place in standing as well as sit>stand from flaps of stedy    Exercises     Assessment/Plan    PT Assessment Patient needs continued PT services  PT Problem List Decreased strength;Decreased activity tolerance;Decreased balance;Decreased mobility;Decreased cognition;Decreased coordination;Decreased knowledge of use of DME;Decreased safety awareness;Decreased knowledge of precautions;Impaired tone       PT Treatment Interventions DME instruction;Functional mobility training;Therapeutic activities;Therapeutic exercise;Balance training;Patient/family education;Cognitive remediation;Neuromuscular re-education    PT Goals (Current goals can be found in the Care Plan section)  Acute Rehab PT Goals Patient Stated Goal: none stated PT Goal Formulation: Patient unable to participate in goal setting Time For Goal  Achievement: 12/22/21 Potential to Achieve Goals: Fair    Frequency Min 2X/week     Co-evaluation               AM-PAC PT "6 Clicks" Mobility  Outcome Measure Help needed turning from your back to your side while in a flat bed without using bedrails?: A Little Help needed moving from lying on your back to sitting on the side of a flat bed without using bedrails?: A Little Help needed moving to and from a bed to a chair (including a wheelchair)?: A Lot Help needed standing up from a chair using your arms (e.g., wheelchair or bedside chair)?: A Lot Help needed to walk in hospital room?: Total Help needed climbing 3-5 steps with a railing? : Total 6 Click Score: 12    End of Session Equipment Utilized During Treatment: Gait belt Activity Tolerance: Patient tolerated treatment well Patient left: in chair;with call bell/phone within reach;with chair alarm set;with family/visitor present Nurse Communication: Mobility status PT Visit Diagnosis: Unsteadiness on feet (R26.81);Muscle weakness (generalized) (M62.81)    Time: 0092-3300 PT Time Calculation (min) (ACUTE ONLY): 21 min   Charges:   PT Evaluation $PT Eval Moderate  Complexity: [place] chat preferred Office [place] 12/08/2021, 4:16 PM

## 2021-12-08 NOTE — Progress Notes (Signed)
Modified Barium Swallow Progress Note  Patient Details  Name: Stephen Robbins MRN: [id number] Date of Birth: 20-Nov-1935  Today's Date: 12/08/2021  Modified Barium Swallow completed.  Full report located under Chart Review in the Imaging Section.  Brief recommendations include the following:  Clinical Impression  Pt presents with a mild oropharyngeal dysphagia. Lingual propulsion is mildly reduced and mastication is a little slow and effortful. Mild oral residue is noted, but pt manages this well with second swallows that he performs with Mod I. Premature spillage occurs primarily with liquids, with thin and nectar thick liquids sitting as far as in the pyriform sinuses before the swallow. He also does not have quite full epiglottic inversion, pharyngeal squeeze, and laryngeal vestibule closure so that when thin liquids spill this far, he does have episodes of aspiration and penetration. Aspiration is sensed sometimes, but not necessarily when it is smaller amounts of penetration that end up falling past the vocal folds. Penetration with nectar thick liquids is trace, shallow, and clears upon subsequent swallows. Mild residue is noted around the level of the pyriform sinuses/UES, increasing as boluses become more solid. When also considering pt's subjective report of not feeling well and as if he can't swallow over the last few weeks, recommend trial of Dys 2 diet and nectar thick liquids with SLP f/u acutely.   Swallow Evaluation Recommendations       SLP Diet Recommendations: Dysphagia 2 (Fine chop) solids;Nectar thick liquid   Liquid Administration via: Straw   Medication Administration: Whole meds with puree   Supervision: Staff to assist with self feeding;Full supervision/cueing for compensatory strategies   Compensations: Slow rate;Small sips/bites   Postural Changes: Seated upright at 90 degrees;Remain semi-upright after after feeds/meals (Comment)   Oral Care Recommendations: Oral  care BID        Osie Bond., M.A. [place] Office [phone]  Secure chat preferred  12/08/2021,12:47 PM

## 2021-12-08 NOTE — Progress Notes (Signed)
PROGRESS NOTE        PATIENT DETAILS Name: Stephen Robbins Age: 86 y.o. Sex: male Date of Birth: 1935/12/09 Admit Date: 12/06/2021 Admitting Physician Bernadette Hoit, DO KWI:OXBDZH, Christian Mate, MD  Brief Summary: Patient is a 86 y.o.  male with history of Parkinson's disease-sick sinus syndrome s/p PPM implantation, DM-2, HLD, BPH, PAF on Xarelto who presented from his assisted living facility with weakness/confusion and lower abdominal pain-patient was found to have complicated UTI and subsequently admitted to the hospitalist service.  See below for further details.  Significant events: 9/14>> admit to [facility]-complicated UTI.  Significant studies: 9/14>> CXR: No PNA 9/14>> CT abdomen/pelvis: Tiny nonobstructing renal calculi, bladder wall thickening, several 6 mm groundglass opacity left lower lobe 9/15>> Echo: EF 55-60%, no regional wall motion abnormality.  Significant microbiology data: 9/14>> blood culture: No growth 9/14>> urine culture: Group B strep.  Procedures: None  Consults: None  Subjective: Improved-hardly any pelvic pain.  Relatively awake and alert-just appears frail.  Objective: Vitals: Blood pressure 131/66, pulse 60, temperature 98 F (36.7 C), temperature source Oral, resp. rate 19, SpO2 100 %.   Exam: Gen Exam:Alert awake-not in any distress.  Appears frail. HEENT:atraumatic, normocephalic Chest: B/L clear to auscultation anteriorly CVS:S1S2 regular Abdomen:soft non tender, non distended Extremities:no edema Neurology: Non focal Skin: no rash   Pertinent Labs/Radiology:    Latest Ref Rng & Units 12/07/2021   12:00 AM 12/06/2021    9:09 PM 12/06/2021   12:57 PM  CBC  WBC 4.0 - 10.5 K/uL 9.3  8.1  11.1   Hemoglobin 13.0 - 17.0 g/dL 10.4  10.8  12.3   Hematocrit 39.0 - 52.0 % 29.2  30.1  34.5   Platelets 150 - 400 K/uL 250  256  296     Lab Results  Component Value Date   NA 140 12/07/2021   K 3.5 12/07/2021   CL 110  12/07/2021   CO2 21 (L) 12/07/2021      Assessment/Plan: Sepsis due to complicated UTI (POA): Sepsis physiology has resolved-culture data as above-continue Rocephin.  No significant residual urine noted on bladder scans.  Lower abdominal pain: Suspect due to UTI-no evidence of urinary retention on bladder scans.   AKI: Hemodynamically mediated-resolved.  Hypokalemia: Repleted-awaiting repeat labs this morning.  Hypophosphatemia: Repleted-awaiting repeat labs this morning.  Minimally elevated troponin: No chest pain-Echo with stable EF and without any regional wall motion abnormality.  Given advanced age/frailty-plan to manage this medically.  Suspect this is not ACS-but rather demand ischemia.   PAF: Continue Xarelto-monitor on telemetry-PPM in place.  History of sick sinus syndrome: PPM in place.  Prolonged QTc: Resolved as of twelve-lead EKG on 9/16.    Parkinson's disease: Continue Sinemet.  Hypothyroidism: Continue Synthroid.  Recent TSH on 8/30 stable.  DM-2 (A1c 6.19/14): Continue SSI-hold all oral hypoglycemics.  Recent Labs    12/07/21 1656 12/07/21 2026 12/08/21 0712  GLUCAP 75 140* 94     HLD: Continue Zocor  BPH: Continue Flomax  Depression: Stable-continue Zoloft  Chronic debility/deconditioning: Lives at [facility] caregiver-walks with cane at baseline-obtaining PT/OT eval.  6 mm left lower lobe groundglass opacity/nodule: Incidental finding on CT abdomen-repeat CT imaging in 3 to 6 months.  BMI: Estimated body mass index is 23.88 kg/m as calculated from the following:   Height as of 03/19/21: '5\' 6"'$  (1.676 m).  Weight as of 03/19/21: 67.1 kg.   Code status:   Code Status: Full Code   DVT Prophylaxis: SCDs Start: 12/06/21 2001 rivaroxaban (XARELTO) tablet 20 mg     Family Communication: Niece-Lisa-[phone]-updated over the phone 9/16   Disposition Plan: Status is: Inpatient Remains inpatient appropriate because: Severity of  illness-sepsis due to complicated UTI-not yet stable for discharge.   Planned Discharge Destination: ALF versus SNF   Diet: Diet Order             Diet clear liquid Room service appropriate? Yes; Fluid consistency: Nectar Thick  Diet effective now                     Antimicrobial agents: Anti-infectives (From admission, onward)    Start     Dose/Rate Route Frequency Ordered Stop   12/07/21 2115  cefTRIAXone (ROCEPHIN) 2 g in sodium chloride 0.9 % 100 mL IVPB        2 g 200 mL/hr over 30 Minutes Intravenous Every 24 hours 12/07/21 0651     12/06/21 2115  cefTRIAXone (ROCEPHIN) 1 g in sodium chloride 0.9 % 100 mL IVPB  Status:  Discontinued        1 g 200 mL/hr over 30 Minutes Intravenous Every 24 hours 12/06/21 2102 12/07/21 0651   12/06/21 2100  ciprofloxacin (CIPRO) IVPB 400 mg  Status:  Discontinued        400 mg 200 mL/hr over 60 Minutes Intravenous Every 12 hours 12/06/21 2047 12/06/21 2102        MEDICATIONS: Scheduled Meds:  carbidopa-levodopa  2 tablet Oral TID   insulin aspart  0-9 Units Subcutaneous TID WC   levothyroxine  75 mcg Oral Q0600   melatonin  10 mg Oral QHS   rivaroxaban  20 mg Oral Q supper   sertraline  100 mg Oral Daily   simvastatin  40 mg Oral QHS   tamsulosin  0.4 mg Oral Daily   Continuous Infusions:  cefTRIAXone (ROCEPHIN)  IV 2 g (12/07/21 2159)   PRN Meds:.ibuprofen, ondansetron (ZOFRAN) IV   I have personally reviewed following labs and imaging studies  LABORATORY DATA: CBC: Recent Labs  Lab 12/06/21 1257 12/06/21 2109 12/07/21 0000  WBC 11.1* 8.1 9.3  NEUTROABS 9.4*  --   --   HGB 12.3* 10.8* 10.4*  HCT 34.5* 30.1* 29.2*  MCV 93.0 92.3 93.3  PLT 296 256 250     Basic Metabolic Panel: Recent Labs  Lab 12/06/21 1257 12/06/21 1536 12/06/21 2109 12/07/21 0000 12/07/21 1335  NA 141  --   --  141 140  K 2.6*  --   --  2.8* 3.5  CL 106  --   --  109 110  CO2 21*  --   --  22 21*  GLUCOSE 139*  --   --  93  174*  BUN 46*  --   --  34* 19  CREATININE 1.66*  --  1.01 0.97 0.79  CALCIUM 9.5  --   --  8.4* 7.8*  MG  --  1.8  --  2.0  --   PHOS  --   --   --  1.7*  --      GFR: CrCl cannot be calculated (Unknown ideal weight.).  Liver Function Tests: Recent Labs  Lab 12/06/21 1257 12/07/21 0000  AST 21 19  ALT 9 6  ALKPHOS 59 48  BILITOT 1.1 0.4  PROT 7.7 5.9*  ALBUMIN 3.7 2.9*  Recent Labs  Lab 12/06/21 1257  LIPASE 18    No results for input(s): "AMMONIA" in the last 168 hours.  Coagulation Profile: No results for input(s): "INR", "PROTIME" in the last 168 hours.  Cardiac Enzymes: No results for input(s): "CKTOTAL", "CKMB", "CKMBINDEX", "TROPONINI" in the last 168 hours.  BNP (last 3 results) No results for input(s): "PROBNP" in the last 8760 hours.  Lipid Profile: No results for input(s): "CHOL", "HDL", "LDLCALC", "TRIG", "CHOLHDL", "LDLDIRECT" in the last 72 hours.  Thyroid Function Tests: No results for input(s): "TSH", "T4TOTAL", "FREET4", "T3FREE", "THYROIDAB" in the last 72 hours.  Anemia Panel: No results for input(s): "VITAMINB12", "FOLATE", "FERRITIN", "TIBC", "IRON", "RETICCTPCT" in the last 72 hours.  Urine analysis:    Component Value Date/Time   COLORURINE YELLOW 12/06/2021 1537   APPEARANCEUR CLOUDY (A) 12/06/2021 1537   LABSPEC 1.015 12/06/2021 1537   PHURINE 5.0 12/06/2021 1537   GLUCOSEU 150 (A) 12/06/2021 1537   GLUCOSEU NEGATIVE 09/26/2009 0859   HGBUR MODERATE (A) 12/06/2021 1537   HGBUR negative 02/03/2007 0755   BILIRUBINUR NEGATIVE 12/06/2021 1537   BILIRUBINUR n 11/19/2012 1026   KETONESUR NEGATIVE 12/06/2021 1537   PROTEINUR 30 (A) 12/06/2021 1537   UROBILINOGEN 0.2 11/19/2012 1026   UROBILINOGEN 2.0 (H) 08/02/2012 1503   NITRITE NEGATIVE 12/06/2021 1537   LEUKOCYTESUR LARGE (A) 12/06/2021 1537    Sepsis Labs: Lactic Acid, Venous    Component Value Date/Time   LATICACIDVEN 1.2 12/07/2021 0000    MICROBIOLOGY: Recent  Results (from the past 240 hour(s))  Culture, blood (routine x 2)     Status: None (Preliminary result)   Collection Time: 12/06/21 12:59 PM   Specimen: BLOOD  Result Value Ref Range Status   Specimen Description BLOOD SITE NOT SPECIFIED  Final   Special Requests   Final    BOTTLES DRAWN AEROBIC AND ANAEROBIC Blood Culture adequate volume   Culture   Final    NO GROWTH 2 DAYS Performed at [facility], [address] [place], [place] [address]    Report Status PENDING  Incomplete  Urine Culture     Status: Abnormal   Collection Time: 12/06/21  4:23 PM   Specimen: Urine, Clean Catch  Result Value Ref Range Status   Specimen Description URINE, CLEAN CATCH  Final   Special Requests NONE  Final   Culture (A)  Final    >=100,000 COLONIES/mL GROUP B STREP(S.AGALACTIAE)ISOLATED TESTING AGAINST S. AGALACTIAE NOT ROUTINELY PERFORMED DUE TO PREDICTABILITY OF AMP/PEN/VAN SUSCEPTIBILITY. Performed at [facility], [place] [address], [place], [place] [address]    Report Status 12/07/2021 FINAL  Final  Culture, blood (routine x 2)     Status: None (Preliminary result)   Collection Time: 12/06/21  4:28 PM   Specimen: BLOOD LEFT FOREARM  Result Value Ref Range Status   Specimen Description BLOOD LEFT FOREARM  Final   Special Requests   Final    BOTTLES DRAWN AEROBIC AND ANAEROBIC Blood Culture adequate volume   Culture   Final    NO GROWTH < 24 HOURS Performed at [facility], [place] [address] [place], [place] [address]    Report Status PENDING  Incomplete  MRSA Next Gen by PCR, Nasal     Status: None   Collection Time: 12/07/21  7:45 PM   Specimen: Nasal Mucosa; Nasal Swab  Result Value Ref Range Status   MRSA by PCR Next Gen NOT DETECTED NOT DETECTED Final    Comment: (NOTE)  The GeneXpert MRSA Assay (FDA approved for NASAL specimens only), is one component of a comprehensive MRSA colonization surveillance program. It is not intended to diagnose MRSA infection nor to  guide or monitor treatment for MRSA infections. Test performance is not FDA approved in patients less than 53 years old. Performed at [facility], [place] [address] [place], [place] [address]     RADIOLOGY STUDIES/RESULTS: ECHOCARDIOGRAM COMPLETE  Result Date: 12/07/2021    ECHOCARDIOGRAM REPORT   Patient Name:   BRAYON BIELEFELD Date of Exam: 12/07/2021 Medical Rec #:  [id number]     Height:       66.0 in Accession #:    [id number]    Weight:       147.9 lb Date of Birth:  Apr 02, 1935      BSA:          1.759 m Patient Age:    66 years      BP:           115/61 mmHg Patient Gender: M             HR:           75 bpm. Exam Location:  Inpatient Procedure: 2D Echo, Cardiac Doppler and Color Doppler Indications:    Elevated troponin  History:        Patient has prior history of Echocardiogram examinations, most                 recent 01/06/2013. Pacemaker, Arrythmias:Atrial Fibrillation;                 Risk Factors:Diabetes and Dyslipidemia. Parkinson's.  Sonographer:    Eartha Inch Referring Phys: Holcomb  Sonographer Comments: Technically difficult study due to poor echo windows. Image acquisition challenging due to respiratory motion and Image acquisition challenging due to patient body habitus. Image acquisition difficult due to patient having Parkinson's. IMPRESSIONS  1. Left ventricular ejection fraction, by estimation, is 55 to 60%. The left ventricle has normal function. The left ventricle has no regional wall motion abnormalities. Left ventricular diastolic parameters were normal.  2. Right ventricular systolic function is normal. The right ventricular size is normal. There is normal pulmonary artery systolic pressure. The estimated right ventricular systolic pressure is 93.2 mmHg.  3. The mitral valve is normal in structure. Trivial mitral valve regurgitation. No evidence of mitral stenosis.  4. The aortic valve is tricuspid. There is mild calcification of the aortic valve. There  is mild thickening of the aortic valve. Aortic valve regurgitation is not visualized. No aortic stenosis is present. Comparison(s): No significant change from prior study. Conclusion(s)/Recommendation(s): Otherwise normal echocardiogram, with minor abnormalities described in the report. Technically difficult images, but overall LVEF is normal without severe wall motion abnormalities. Reduced sensitivity for minor changes given image quality. FINDINGS  Left Ventricle: Left ventricular ejection fraction, by estimation, is 55 to 60%. The left ventricle has normal function. The left ventricle has no regional wall motion abnormalities. The left ventricular internal cavity size was normal in size. There is  no left ventricular hypertrophy. Left ventricular diastolic parameters were normal. Right Ventricle: The right ventricular size is normal. Right vetricular wall thickness was not well visualized. Right ventricular systolic function is normal. There is normal pulmonary artery systolic pressure. The tricuspid regurgitant velocity is 2.32 m/s, and with an assumed right atrial pressure of 8 mmHg, the estimated right ventricular systolic pressure is 67.1 mmHg. Left Atrium: Left atrial size was normal  in size. Right Atrium: Right atrial size was not well visualized. Pericardium: There is no evidence of pericardial effusion. Mitral Valve: The mitral valve is normal in structure. Trivial mitral valve regurgitation. No evidence of mitral valve stenosis. Tricuspid Valve: The tricuspid valve is normal in structure. Tricuspid valve regurgitation is mild . No evidence of tricuspid stenosis. Aortic Valve: The aortic valve is tricuspid. There is mild calcification of the aortic valve. There is mild thickening of the aortic valve. Aortic valve regurgitation is not visualized. No aortic stenosis is present. Pulmonic Valve: The pulmonic valve was not well visualized. Pulmonic valve regurgitation is not visualized. No evidence of  pulmonic stenosis. Aorta: The aortic root, ascending aorta and aortic arch are all structurally normal, with no evidence of dilitation or obstruction. Venous: The inferior vena cava was not well visualized. IAS/Shunts: The interatrial septum was not well visualized. Additional Comments: A device lead is visualized in the right atrium and right ventricle.  LEFT VENTRICLE PLAX 2D LVIDd:         4.60 cm   Diastology LVIDs:         3.70 cm   LV e' medial:    8.49 cm/s LV PW:         0.70 cm   LV E/e' medial:  8.1 LV IVS:        0.80 cm   LV e' lateral:   10.10 cm/s LVOT diam:     2.10 cm   LV E/e' lateral: 6.8 LVOT Area:     3.46 cm  RIGHT VENTRICLE RV S prime:     11.60 cm/s TAPSE (M-mode): 1.5 cm LEFT ATRIUM         Index LA diam:    2.60 cm 1.48 cm/m   AORTA Ao Root diam: 3.00 cm Ao Asc diam:  3.50 cm MITRAL VALVE               TRICUSPID VALVE MV Area (PHT): 2.68 cm    TR Peak grad:   21.5 mmHg MV Decel Time: 283 msec    TR Mean grad:   15.0 mmHg MV E velocity: 69.00 cm/s  TR Vmax:        232.00 cm/s MV A velocity: 70.90 cm/s  TR Vmean:       184.0 cm/s MV E/A ratio:  0.97                            SHUNTS                            Systemic Diam: 2.10 cm Buford Dresser MD Electronically signed by Buford Dresser MD Signature Date/Time: 12/07/2021/5:21:42 PM    Final    CT ABDOMEN PELVIS WO CONTRAST  Result Date: 12/06/2021 CLINICAL DATA:  Abdominal pain, weakness, in GI upset for 1 week increased in past 2 days, near syncopal episode this morning while ambulating. Past history diabetes mellitus, former smoker, arrhythmia EXAM: CT ABDOMEN AND PELVIS WITHOUT CONTRAST TECHNIQUE: Multidetector CT imaging of the abdomen and pelvis was performed following the standard protocol without IV contrast. RADIATION DOSE REDUCTION: This exam was performed according to the departmental dose-optimization program which includes automated exposure control, adjustment of the mA and/or kV according to patient size  and/or use of iterative reconstruction technique. COMPARISON:  None Available. FINDINGS: Lower chest: Pacemaker leads RIGHT atrium and RIGHT ventricle. Several foci of minimal ground-glass opacity  centrally in LEFT lower lobe, largest 6 mm. RIGHT lung base clear. Hepatobiliary: Gallbladder and liver normal appearance Pancreas: Fatty replacement of pancreas. Few calcifications at pancreatic head and uncinate. Spleen: Normal appearance Adrenals/Urinary Tract: Adrenal glands normal appearance. Tiny nonobstructing renal calculi. No renal mass, hydronephrosis or hydroureter. Bladder wall thickening question chronic outlet obstruction. Stomach/Bowel: Appendix not definitely visualized. Small amount of fluid in rectum. Stomach and bowel loops otherwise normal appearance. Vascular/Lymphatic: Atherosclerotic calcifications aorta and iliac arteries without aneurysm. No adenopathy. Reproductive: Mild prostatic enlargement Other: No free air or free fluid. No hernia or inflammatory process. Musculoskeletal: Unremarkable IMPRESSION: Tiny nonobstructing renal calculi. Bladder wall thickening question chronic outlet obstruction secondary to mild prostatic enlargement. Several foci of minimal ground-glass opacity centrally in LEFT lower lobe, largest 6 mm, nonspecific; follow-up CT imaging is recommended at 3-6 months to determine persistence. Aortic Atherosclerosis (ICD10-I70.0). Electronically Signed   By: Lavonia Dana M.D.   On: 12/06/2021 17:35   DG Chest Port 1 View  Result Date: 12/06/2021 CLINICAL DATA:  Weakness EXAM: PORTABLE CHEST 1 VIEW COMPARISON:  02/08/2014 FINDINGS: No focal consolidation. No pleural effusion or pneumothorax. Heart and mediastinal contours are unremarkable. Dual lead cardiac pacemaker. No acute osseous abnormality. IMPRESSION: No active disease. Electronically Signed   By: Kathreen Devoid M.D.   On: 12/06/2021 13:50     LOS: 2 days   Oren Binet, MD  [facility]    To contact the  attending provider between 7A-7P or the covering provider during after hours 7P-7A, please log into the web site [email or web address] and access using universal [place] password for that web site. If you do not have the password, please call the hospital operator.  12/08/2021, 10:34 AM

## 2021-12-09 DIAGNOSIS — N400 Enlarged prostate without lower urinary tract symptoms: Secondary | ICD-10-CM | POA: Diagnosis not present

## 2021-12-09 DIAGNOSIS — R1084 Generalized abdominal pain: Secondary | ICD-10-CM | POA: Diagnosis not present

## 2021-12-09 DIAGNOSIS — N390 Urinary tract infection, site not specified: Secondary | ICD-10-CM | POA: Diagnosis not present

## 2021-12-09 DIAGNOSIS — N179 Acute kidney failure, unspecified: Secondary | ICD-10-CM | POA: Diagnosis not present

## 2021-12-09 LAB — MAGNESIUM: Magnesium: 1.3 mg/dL — ABNORMAL LOW (ref 1.7–2.4)

## 2021-12-09 LAB — BASIC METABOLIC PANEL
Anion gap: 11 (ref 5–15)
BUN: 5 mg/dL — ABNORMAL LOW (ref 8–23)
CO2: 21 mmol/L — ABNORMAL LOW (ref 22–32)
Calcium: 8.1 mg/dL — ABNORMAL LOW (ref 8.9–10.3)
Chloride: 111 mmol/L (ref 98–111)
Creatinine, Ser: 0.59 mg/dL — ABNORMAL LOW (ref 0.61–1.24)
GFR, Estimated: >60 mL/min (ref >60)
Glucose, Bld: 135 mg/dL — ABNORMAL HIGH (ref 70–99)
Potassium: 4 mmol/L (ref 3.5–5.1)
Sodium: 143 mmol/L (ref 135–145)

## 2021-12-09 LAB — GLUCOSE, CAPILLARY: Glucose-Capillary: 103 mg/dL — ABNORMAL HIGH (ref 70–99)

## 2021-12-09 LAB — PHOSPHORUS: Phosphorus: 2.6 mg/dL (ref 2.5–4.6)

## 2021-12-09 MED ORDER — CEFDINIR 300 MG PO CAPS: 300.0000 mg | ORAL_CAPSULE | Freq: Two times a day (BID) | ORAL | 0 refills | Status: AC

## 2021-12-09 NOTE — Discharge Summary (Signed)
PATIENT DETAILS Name: Stephen Robbins Age: 86 y.o. Sex: male Date of Birth: [birth year] MRN: [id number]. Admitting Physician: Bernadette Hoit, DO RZN:BVAPOL, Christian Mate, MD  Admit Date: 12/06/2021 Discharge date: 12/09/2021  Recommendations for Outpatient Follow-up:  Follow up with PCP in 1-2 weeks Please obtain CMP/CBC/magnesium/phosphorus in one week Incidental finding-lung nodule-see below-needs repeat imaging in 3 to 6 months. Would benefit from palliative care follow-up at SNF  Admitted From:  SNF  Disposition: Skilled nursing facility   Discharge Condition: fair  CODE STATUS:   Code Status: Full Code   Diet recommendation:  Diet Order             Diet - low sodium heart healthy           Diet Carb Modified           DIET DYS 2 Room service appropriate? Yes with Assist; Fluid consistency: Nectar Thick  Diet effective now                    Brief Summary: Patient is a 86 y.o.  male with history of Parkinson's disease-sick sinus syndrome s/p PPM implantation, DM-2, HLD, BPH, PAF on Xarelto who presented from his assisted living facility with weakness/confusion and lower abdominal pain-patient was found to have complicated UTI and subsequently admitted to the hospitalist service.  See below for further details.   Significant events: 9/14>> admit to [facility]-complicated UTI.   Significant studies: 9/14>> CXR: No PNA 9/14>> CT abdomen/pelvis: Tiny nonobstructing renal calculi, bladder wall thickening, several 6 mm groundglass opacity left lower lobe 9/15>> Echo: EF 55-60%, no regional wall motion abnormality.   Significant microbiology data: 9/14>> blood culture: No growth 9/14>> urine culture: Group B strep.   Procedures: None   Consults: None  Brief Hospital Course: Sepsis due to complicated UTI (POA): Sepsis physiology has resolved-culture data as above-treated with Rocephin-we will transition to oral antibiotics on discharge.  No significant residual  urine noted on bladder scans.   Lower abdominal pain: Suspect due to UTI-no evidence of urinary retention on bladder scans.    AKI: Hemodynamically mediated-resolved.   Hypokalemia: Repleted-recheck labs in 1 week   Hypophosphatemia: Repleted-recheck labs in 1 week  Hypomagnesemia: Repleted-recheck labs in 1 week.   Minimally elevated troponin: No chest pain-Echo with stable EF and without any regional wall motion abnormality.  Given advanced age/frailty-plan to manage this medically.  Suspect this is not ACS-but rather demand ischemia.    PAF: Continue Xarelto-monitor on telemetry-PPM in place.   History of sick sinus syndrome: PPM in place.   Prolonged QTc: Resolved as of twelve-lead EKG on 9/16.     Parkinson's disease: Continue Sinemet.   Hypothyroidism: Continue Synthroid.  Recent TSH on 8/30 stable.   DM-2 (A1c 6.19/14): CBG stable with SSI-resume oral hypoglycemics on discharge.    HLD: Continue Zocor   BPH: Continue Flomax   Depression: Stable-continue Zoloft   Chronic debility/deconditioning: Lives at ALF/SNF-has 24/7 caregiver-walks with cane at baseline-appreciate PT/OT eval.   6 mm left lower lobe groundglass opacity/nodule: Incidental finding on CT abdomen-repeat CT imaging in 3 to 6 months.   BMI: Estimated body mass index is 23.88 kg/m as calculated from the following:   Height as of 03/19/21: 5' 6"  (1.676 m).   Weight as of 03/19/21: 67.1 kg.   Note-POA-niece Lattie Haw updated on the day of discharge.   Discharge Diagnoses:  Principal Problem:   Generalized abdominal pain Active Problems:   BPH (benign prostatic hyperplasia)  Parkinson's disease ([place])   SSS (sick sinus syndrome) [facility])   Pacemaker - [employer] model L121 serial number [id number]   Paroxysmal atrial fibrillation (HCC)   Diabetes mellitus type 2 in nonobese (HCC)   Generalized weakness   Dehydration   Hypokalemia   AKI (acute kidney injury) ([place])   UTI (urinary tract  infection)   Prolonged QT interval   Lactic acidosis   Mixed hyperlipidemia   Diarrhea   Discharge Instructions:  Activity:  As tolerated with Full fall precautions use walker/cane & assistance as needed  Discharge Instructions     Call MD for:  persistant nausea and vomiting   Complete by: As directed    Call MD for:  temperature >100.4   Complete by: As directed    Diet - low sodium heart healthy   Complete by: As directed    Diet Carb Modified   Complete by: As directed    Discharge instructions   Complete by: As directed    Follow with Primary MD  Javier Glazier, MD in 1-2 weeks  Please get a complete blood count and chemistry panel checked by your Primary MD at your next visit, and again as instructed by your Primary MD.  Get Medicines reviewed and adjusted: Please take all your medications with you for your next visit with your Primary MD  Laboratory/radiological data: Please request your Primary MD to go over all hospital tests and procedure/radiological results at the follow up, please ask your Primary MD to get all Hospital records sent to his/her office.  In some cases, they will be blood work, cultures and biopsy results pending at the time of your discharge. Please request that your primary care M.D. follows up on these results.  Also Note the following: If you experience worsening of your admission symptoms, develop shortness of breath, life threatening emergency, suicidal or homicidal thoughts you must seek medical attention immediately by calling 911 or calling your MD immediately  if symptoms less severe.  You must read complete instructions/literature along with all the possible adverse reactions/side effects for all the Medicines you take and that have been prescribed to you. Take any new Medicines after you have completely understood and accpet all the possible adverse reactions/side effects.   Do not drive when taking Pain medications or sleeping  medications (Benzodaizepines)  Do not take more than prescribed Pain, Sleep and Anxiety Medications. It is not advisable to combine anxiety,sleep and pain medications without talking with your primary care practitioner  Special Instructions: If you have smoked or chewed Tobacco  in the last 2 yrs please stop smoking, stop any regular Alcohol  and or any Recreational drug use.  Wear Seat belts while driving.  Please note: You were cared for by a hospitalist during your hospital stay. Once you are discharged, your primary care physician will handle any further medical issues. Please note that NO REFILLS for any discharge medications will be authorized once you are discharged, as it is imperative that you return to your primary care physician (or establish a relationship with a primary care physician if you do not have one) for your post hospital discharge needs so that they can reassess your need for medications and monitor your lab values.   Increase activity slowly   Complete by: As directed       Allergies as of 12/09/2021       Reactions   Covid-19 Mrna Vacc (moderna)    "Couldn't move". No rash, no itching  Penicillins Other (See Comments)   Unknown childhood reaction        Medication List     TAKE these medications    Accu-Chek FastClix Lancets Misc Check blood sugar three times daily   B12 FOLATE PO Take 2 tablets by mouth daily at 12 noon.   blood glucose meter kit and supplies Dispense based on patient and insurance preference. Check blood sugars daily and as needed Dx: e11.9   Blood Pressure Kit Take blood pressure twice weekly.   buPROPion 75 MG tablet Commonly known as: WELLBUTRIN TAKE 1 TABLET(75 MG) BY MOUTH TWICE DAILY What changed: See the new instructions.   carbidopa-levodopa 25-100 MG tablet Commonly known as: SINEMET IR Take 2 tablets by mouth 3 (three) times daily.   cefdinir 300 MG capsule Commonly known as: OMNICEF Take 1 capsule (300 mg  total) by mouth 2 (two) times daily for 3 days.   Farxiga 10 MG Tabs tablet Generic drug: dapagliflozin propanediol TAKE 1 TABLET(10 MG) BY MOUTH DAILY What changed: See the new instructions.   Gemtesa 75 MG Tabs Generic drug: Vibegron Take 75 mg by mouth daily.   ibuprofen 400 MG tablet Commonly known as: ADVIL Take 400 mg by mouth every 6 (six) hours as needed for mild pain.   levothyroxine 75 MCG tablet Commonly known as: SYNTHROID TAKE 1 TABLET(75 MCG) BY MOUTH DAILY BEFORE AND BREAKFAST What changed: See the new instructions.   loperamide 2 MG tablet Commonly known as: IMODIUM A-D Take 4 mg by mouth daily.   Melatonin 10 MG Tabs Take 10 mg by mouth at bedtime.   metFORMIN 1000 MG tablet Commonly known as: GLUCOPHAGE TAKE 1 TABLET(1000 MG) BY MOUTH TWICE DAILY WITH A MEAL What changed: See the new instructions.   psyllium 0.52 g capsule Commonly known as: REGULOID Take 0.52 g by mouth daily.   rivaroxaban 20 MG Tabs tablet Commonly known as: Xarelto Take 1 tablet (20 mg total) by mouth daily with supper. NEEDS CARDIOLOGY APPOINTMENT, PLEASE CALL OFFICE   sertraline 100 MG tablet Commonly known as: ZOLOFT Take 1 tablet (100 mg total) by mouth daily.   simvastatin 40 MG tablet Commonly known as: ZOCOR Take 1 tablet (40 mg total) by mouth at bedtime.   tamsulosin 0.4 MG Caps capsule Commonly known as: FLOMAX Take 0.4 mg by mouth at bedtime.   traZODone 50 MG tablet Commonly known as: DESYREL Take 50 mg by mouth at bedtime.        Follow-up Information     Javier Glazier, MD Follow up in 1 week(s).   Specialty: Internal Medicine Contact information: [place] [address] [phone]         Croitoru, [name], MD Follow up in 1 month(s).   Specialty: Cardiology Contact information: [address] [place] [address] [phone]                Allergies  Allergen Reactions   Covid-19 Mrna Vacc  (Moderna)     "Couldn't move". No rash, no itching    Penicillins Other (See Comments)    Unknown childhood reaction     Other Procedures/Studies: DG Swallowing Func-Speech Pathology  Result Date: 12/08/2021 Table formatting from the original result was not included. Objective Swallowing Evaluation: Type of Study: MBS-Modified Barium Swallow Study  Patient Details Name: ARREN LAMINACK MRN: [id number] Date of Birth: 15-Nov-1935 Today's Date: 12/08/2021 Time: SLP Start Time (ACUTE ONLY): 7366 -SLP Stop Time (ACUTE ONLY): 8159 SLP  Time Calculation (min) (ACUTE ONLY): 25 min Past Medical History: Past Medical History: Diagnosis Date  Arthritis   "joints; a little" (01/04/2014)  Asymptomatic bilateral carotid artery stenosis   per duplex  05-15-2012  left >39%/   right 40-59%  Basal cell carcinoma   nose  Borderline diabetes   BPH (benign prostatic hypertrophy) with urinary obstruction   Bradycardia   Bronchial pneumonia 1958  Chronotropic incompetence with sinus node dysfunction (HCC)   Compressed cervical disc   Compression of lumbar vertebra (HCC)   L4 -- L5  Dizzy   Dysmetabolic syndrome   Fatigue   Frequency of urination   GERD (gastroesophageal reflux disease)   occasional  Hemorrhoids   Hyperlipidemia   Hypothyroidism   Nocturia   NSVT (nonsustained ventricular tachycardia) [facility])   cardiologist-  dr croitoru  Pacemaker   Urgency of urination   Wears glasses  Past Surgical History: Past Surgical History: Procedure Laterality Date  BASAL CELL CARCINOMA EXCISION    nose  CLOSED REDUCTION NASAL FRACTURE  09-01-2007  CYSTOSCOPY N/A 12/28/2012  Procedure: CYSTOSCOPY;  Surgeon: Bernestine Amass, MD;  [facility];  Service: Urology;  Laterality: N/A;  EXERCISE TOLERENCE TEST  12-03-2012  DR CROITURO  CHRONOTROPIC INCOMPETENCE/ NORMAL RESTING BP W/ APPROPRIATE RESPONSE/ NO CHEST PAIN/ NO ST CHANGES FROM BASELINE  INSERT / REPLACE / REMOVE PACEMAKER  01/04/2014  [employer] model  L121 serial number [id number]  LACERATION REPAIR Right 1978  middle finger  NEUROPLASTY / TRANSPOSITION ULNAR NERVE AT ELBOW Left 02-09-2010  PERMANENT PACEMAKER INSERTION N/A 01/04/2014  Procedure: PERMANENT PACEMAKER INSERTION;  Surgeon: Sanda Klein, MD;  Location: [place] CATH [facility];  Service: Cardiovascular;  Laterality: N/A;  SKIN BIOPSY    scc  TONSILLECTOMY AND ADENOIDECTOMY  1944  TRANSURETHRAL INCISION OF PROSTATE N/A 12/28/2012  Procedure: TRANSURETHRAL INCISION OF THE PROSTATE (TUIP);  Surgeon: Bernestine Amass, MD;  Location: [facility];  Service: Urology;  Laterality: N/A;  ULNAR NERVE TRANSPOSITION   HPI: 86 yo male admitted with abdominal pain and weakness, found to have sepsis from UTI. Pt reports difficulty swallowing for a few weeks PTA. CXR without acute findings. MBS in 2013 with normal appearing oropharyngeal swallow but with concern for a primary esophageal component. PMH: Parkinson's disease, sick sinus node dysfunction s/p cardiac pacemaker, DM2, HLD, BPH, Afib on xarelto  Subjective: pt says he's been having increased trouble swallowing over the last few weeks  Recommendations for follow up therapy are one component of a multi-disciplinary discharge planning process, led by the attending physician.  Recommendations may be updated based on patient status, additional functional criteria and insurance authorization. Assessment / Plan / Recommendation   12/08/2021  11:00 AM Clinical Impressions Clinical Impression Pt presents with a mild oropharyngeal dysphagia. Lingual propulsion is mildly reduced and mastication is a little slow and effortful. Mild oral residue is noted, but pt manages this well with second swallows that he performs with Mod I. Premature spillage occurs primarily with liquids, with thin and nectar thick liquids sitting as far as in the pyriform sinuses before the swallow. He also does not have quite full epiglottic inversion, pharyngeal squeeze, and laryngeal vestibule  closure so that when thin liquids spill this far, he does have episodes of aspiration and penetration. Aspiration is sensed sometimes, but not necessarily when it is smaller amounts of penetration that end up falling past the vocal folds. Penetration with nectar thick liquids is trace, shallow, and clears upon subsequent swallows.  Mild residue is noted around the level of the pyriform sinuses/UES, increasing as boluses become more solid. When also considering pt's subjective report of not feeling well and as if he can't swallow over the last few weeks, recommend trial of Dys 2 diet and nectar thick liquids with SLP f/u acutely. SLP Visit Diagnosis Dysphagia, oropharyngeal phase (R13.12) Impact on safety and function Mild aspiration risk;Moderate aspiration risk;Risk for inadequate nutrition/hydration     12/08/2021  11:00 AM Treatment Recommendations Treatment Recommendations Therapy as outlined in treatment plan below     12/08/2021  11:00 AM Prognosis Prognosis for Safe Diet Advancement Good   12/08/2021  11:00 AM Diet Recommendations SLP Diet Recommendations Dysphagia 2 (Fine chop) solids;Nectar thick liquid Liquid Administration via Straw Medication Administration Whole meds with puree Compensations Slow rate;Small sips/bites Postural Changes Seated upright at 90 degrees;Remain semi-upright after after feeds/meals (Comment)     12/08/2021  11:00 AM Other Recommendations Oral Care Recommendations Oral care BID Follow Up Recommendations Skilled nursing-short term rehab (<3 hours/day) Assistance recommended at discharge Frequent or constant Supervision/Assistance Functional Status Assessment Patient has had a recent decline in their functional status and demonstrates the ability to make significant improvements in function in a reasonable and predictable amount of time.   12/08/2021  11:00 AM Frequency and Duration  Speech Therapy Frequency (ACUTE ONLY) min 2x/week Treatment Duration 2 weeks     12/08/2021  11:00 AM Oral  Phase Oral Phase Impaired Oral - Nectar Straw Premature spillage;Lingual/palatal residue Oral - Thin Straw Premature spillage Oral - Puree Delayed oral transit Oral - Regular Impaired mastication;Delayed oral transit Oral - Pill Delayed oral transit    12/08/2021  11:00 AM Pharyngeal Phase Pharyngeal Phase Impaired Pharyngeal- Nectar Straw Delayed swallow initiation-pyriform sinuses;Reduced airway/laryngeal closure;Reduced epiglottic inversion;Reduced pharyngeal peristalsis;Pharyngeal residue - pyriform;Penetration/Aspiration before swallow;Penetration/Aspiration during swallow Pharyngeal Material enters airway, remains ABOVE vocal cords then ejected out Pharyngeal- Thin Straw Delayed swallow initiation-pyriform sinuses;Reduced airway/laryngeal closure;Penetration/Aspiration before swallow;Reduced epiglottic inversion;Reduced pharyngeal peristalsis;Pharyngeal residue - pyriform Pharyngeal Material enters airway, passes BELOW cords and not ejected out despite cough attempt by patient Pharyngeal- Puree Reduced airway/laryngeal closure;Reduced epiglottic inversion;Reduced pharyngeal peristalsis;Pharyngeal residue - pyriform Pharyngeal- Regular Reduced airway/laryngeal closure;Reduced epiglottic inversion;Reduced pharyngeal peristalsis;Pharyngeal residue - pyriform Pharyngeal- Pill Reduced airway/laryngeal closure;Reduced epiglottic inversion;Reduced pharyngeal peristalsis;Delayed swallow initiation-vallecula    12/08/2021  11:00 AM Cervical Esophageal Phase  Cervical Esophageal Phase Impaired Osie Bond., M.A. [place] Office [phone] Secure chat preferred 12/08/2021, 12:48 PM                     ECHOCARDIOGRAM COMPLETE  Result Date: 12/07/2021    ECHOCARDIOGRAM REPORT   Patient Name:   BILLYE NYDAM Date of Exam: 12/07/2021 Medical Rec #:  [id number]     Height:       66.0 in Accession #:    [id number]    Weight:       147.9 lb Date of Birth:  [birth year]      BSA:          1.759 m Patient  Age:    66 years      BP:           115/61 mmHg Patient Gender: M             HR:           75 bpm. Exam Location:  Inpatient Procedure: 2D Echo, Cardiac Doppler and Color Doppler Indications:    Elevated troponin  History:  Patient has prior history of Echocardiogram examinations, most                 recent 01/06/2013. Pacemaker, Arrythmias:Atrial Fibrillation;                 Risk Factors:Diabetes and Dyslipidemia. Parkinson's.  Sonographer:    Eartha Inch Referring Phys: [place]  Sonographer Comments: Technically difficult study due to poor echo windows. Image acquisition challenging due to respiratory motion and Image acquisition challenging due to patient body habitus. Image acquisition difficult due to patient having Parkinson's. IMPRESSIONS  1. Left ventricular ejection fraction, by estimation, is 55 to 60%. The left ventricle has normal function. The left ventricle has no regional wall motion abnormalities. Left ventricular diastolic parameters were normal.  2. Right ventricular systolic function is normal. The right ventricular size is normal. There is normal pulmonary artery systolic pressure. The estimated right ventricular systolic pressure is 16.1 mmHg.  3. The mitral valve is normal in structure. Trivial mitral valve regurgitation. No evidence of mitral stenosis.  4. The aortic valve is tricuspid. There is mild calcification of the aortic valve. There is mild thickening of the aortic valve. Aortic valve regurgitation is not visualized. No aortic stenosis is present. Comparison(s): No significant change from prior study. Conclusion(s)/Recommendation(s): Otherwise normal echocardiogram, with minor abnormalities described in the report. Technically difficult images, but overall LVEF is normal without severe wall motion abnormalities. Reduced sensitivity for minor changes given image quality. FINDINGS  Left Ventricle: Left ventricular ejection fraction, by estimation, is 55 to  60%. The left ventricle has normal function. The left ventricle has no regional wall motion abnormalities. The left ventricular internal cavity size was normal in size. There is  no left ventricular hypertrophy. Left ventricular diastolic parameters were normal. Right Ventricle: The right ventricular size is normal. Right vetricular wall thickness was not well visualized. Right ventricular systolic function is normal. There is normal pulmonary artery systolic pressure. The tricuspid regurgitant velocity is 2.32 m/s, and with an assumed right atrial pressure of 8 mmHg, the estimated right ventricular systolic pressure is 09.6 mmHg. Left Atrium: Left atrial size was normal in size. Right Atrium: Right atrial size was not well visualized. Pericardium: There is no evidence of pericardial effusion. Mitral Valve: The mitral valve is normal in structure. Trivial mitral valve regurgitation. No evidence of mitral valve stenosis. Tricuspid Valve: The tricuspid valve is normal in structure. Tricuspid valve regurgitation is mild . No evidence of tricuspid stenosis. Aortic Valve: The aortic valve is tricuspid. There is mild calcification of the aortic valve. There is mild thickening of the aortic valve. Aortic valve regurgitation is not visualized. No aortic stenosis is present. Pulmonic Valve: The pulmonic valve was not well visualized. Pulmonic valve regurgitation is not visualized. No evidence of pulmonic stenosis. Aorta: The aortic root, ascending aorta and aortic arch are all structurally normal, with no evidence of dilitation or obstruction. Venous: The inferior vena cava was not well visualized. IAS/Shunts: The interatrial septum was not well visualized. Additional Comments: A device lead is visualized in the right atrium and right ventricle.  LEFT VENTRICLE PLAX 2D LVIDd:         4.60 cm   Diastology LVIDs:         3.70 cm   LV e' medial:    8.49 cm/s LV PW:         0.70 cm   LV E/e' medial:  8.1 LV IVS:        0.80  cm    LV e' lateral:   10.10 cm/s LVOT diam:     2.10 cm   LV E/e' lateral: 6.8 LVOT Area:     3.46 cm  RIGHT VENTRICLE RV S prime:     11.60 cm/s TAPSE (M-mode): 1.5 cm LEFT ATRIUM         Index LA diam:    2.60 cm 1.48 cm/m   AORTA Ao Root diam: 3.00 cm Ao Asc diam:  3.50 cm MITRAL VALVE               TRICUSPID VALVE MV Area (PHT): 2.68 cm    TR Peak grad:   21.5 mmHg MV Decel Time: 283 msec    TR Mean grad:   15.0 mmHg MV E velocity: 69.00 cm/s  TR Vmax:        232.00 cm/s MV A velocity: 70.90 cm/s  TR Vmean:       184.0 cm/s MV E/A ratio:  0.97                            SHUNTS                            Systemic Diam: 2.10 cm Buford Dresser MD Electronically signed by Buford Dresser MD Signature Date/Time: 12/07/2021/5:21:42 PM    Final    CT ABDOMEN PELVIS WO CONTRAST  Result Date: 12/06/2021 CLINICAL DATA:  Abdominal pain, weakness, in GI upset for 1 week increased in past 2 days, near syncopal episode this morning while ambulating. Past history diabetes mellitus, former smoker, arrhythmia EXAM: CT ABDOMEN AND PELVIS WITHOUT CONTRAST TECHNIQUE: Multidetector CT imaging of the abdomen and pelvis was performed following the standard protocol without IV contrast. RADIATION DOSE REDUCTION: This exam was performed according to the departmental dose-optimization program which includes automated exposure control, adjustment of the mA and/or kV according to patient size and/or use of iterative reconstruction technique. COMPARISON:  None Available. FINDINGS: Lower chest: Pacemaker leads RIGHT atrium and RIGHT ventricle. Several foci of minimal ground-glass opacity centrally in LEFT lower lobe, largest 6 mm. RIGHT lung base clear. Hepatobiliary: Gallbladder and liver normal appearance Pancreas: Fatty replacement of pancreas. Few calcifications at pancreatic head and uncinate. Spleen: Normal appearance Adrenals/Urinary Tract: Adrenal glands normal appearance. Tiny nonobstructing renal calculi. No renal  mass, hydronephrosis or hydroureter. Bladder wall thickening question chronic outlet obstruction. Stomach/Bowel: Appendix not definitely visualized. Small amount of fluid in rectum. Stomach and bowel loops otherwise normal appearance. Vascular/Lymphatic: Atherosclerotic calcifications aorta and iliac arteries without aneurysm. No adenopathy. Reproductive: Mild prostatic enlargement Other: No free air or free fluid. No hernia or inflammatory process. Musculoskeletal: Unremarkable IMPRESSION: Tiny nonobstructing renal calculi. Bladder wall thickening question chronic outlet obstruction secondary to mild prostatic enlargement. Several foci of minimal ground-glass opacity centrally in LEFT lower lobe, largest 6 mm, nonspecific; follow-up CT imaging is recommended at 3-6 months to determine persistence. Aortic Atherosclerosis (ICD10-I70.0). Electronically Signed   By: [place] Dana M.D.   On: 12/06/2021 17:35   DG Chest Port 1 View  Result Date: 12/06/2021 CLINICAL DATA:  Weakness EXAM: PORTABLE CHEST 1 VIEW COMPARISON:  02/08/2014 FINDINGS: No focal consolidation. No pleural effusion or pneumothorax. Heart and mediastinal contours are unremarkable. Dual lead cardiac pacemaker. No acute osseous abnormality. IMPRESSION: No active disease. Electronically Signed   By: Kathreen Devoid M.D.   On: 12/06/2021 13:50     TODAY-DAY  OF DISCHARGE:  Subjective:   Stephen Robbins today has no headache,no chest abdominal pain,no new weakness tingling or numbness, feels much better wants to go home today.   Objective:   Blood pressure 98/67, pulse 77, temperature 97.8 F (36.6 C), resp. rate 16, SpO2 99 %.  Intake/Output Summary (Last 24 hours) at 12/09/2021 0900 Last data filed at 12/09/2021 0800 Gross per 24 hour  Intake 860 ml  Output 1400 ml  Net -540 ml   There were no vitals filed for this visit.  Exam: Awake Alert, Oriented *3, No new F.N deficits, Normal affect [place].AT,PERRAL Supple Neck,No JVD, No cervical  lymphadenopathy appriciated.  Symmetrical Chest wall movement, Good air movement bilaterally, CTAB RRR,No Gallops,Rubs or new Murmurs, No Parasternal Heave +ve B.Sounds, Abd Soft, Non tender, No organomegaly appriciated, No rebound -guarding or rigidity. No Cyanosis, Clubbing or edema, No new Rash or bruise   PERTINENT RADIOLOGIC STUDIES: DG Swallowing Func-Speech Pathology  Result Date: 12/08/2021 Table formatting from the original result was not included. Objective Swallowing Evaluation: Type of Study: MBS-Modified Barium Swallow Study  Patient Details Name: DUGLAS HEIER MRN: [id number] Date of Birth: Jul 22, 1935 Today's Date: 12/08/2021 Time: SLP Start Time (ACUTE ONLY): 1059 -SLP Stop Time (ACUTE ONLY): 7169 SLP Time Calculation (min) (ACUTE ONLY): 25 min Past Medical History: Past Medical History: Diagnosis Date  Arthritis   "joints; a little" (01/04/2014)  Asymptomatic bilateral carotid artery stenosis   per duplex  05-15-2012  left >39%/   right 40-59%  Basal cell carcinoma   nose  Borderline diabetes   BPH (benign prostatic hypertrophy) with urinary obstruction   Bradycardia   Bronchial pneumonia 1958  Chronotropic incompetence with sinus node dysfunction (HCC)   Compressed cervical disc   Compression of lumbar vertebra (HCC)   L4 -- L5  Dizzy   Dysmetabolic syndrome   Fatigue   Frequency of urination   GERD (gastroesophageal reflux disease)   occasional  Hemorrhoids   Hyperlipidemia   Hypothyroidism   Nocturia   NSVT (nonsustained ventricular tachycardia) [facility])   cardiologist-  dr croitoru  Pacemaker   Urgency of urination   Wears glasses  Past Surgical History: Past Surgical History: Procedure Laterality Date  BASAL CELL CARCINOMA EXCISION    nose  CLOSED REDUCTION NASAL FRACTURE  09-01-2007  CYSTOSCOPY N/A 12/28/2012  Procedure: CYSTOSCOPY;  Surgeon: Bernestine Amass, MD;  Location: [facility];  Service: Urology;  Laterality: N/A;  EXERCISE TOLERENCE TEST  12-03-2012  DR CROITURO   CHRONOTROPIC INCOMPETENCE/ NORMAL RESTING BP W/ APPROPRIATE RESPONSE/ NO CHEST PAIN/ NO ST CHANGES FROM BASELINE  INSERT / REPLACE / REMOVE PACEMAKER  01/04/2014  [employer] model L121 serial number [id number]  LACERATION REPAIR Right 1978  middle finger  NEUROPLASTY / TRANSPOSITION ULNAR NERVE AT ELBOW Left 02-09-2010  PERMANENT PACEMAKER INSERTION N/A 01/04/2014  Procedure: PERMANENT PACEMAKER INSERTION;  Surgeon: Sanda Klein, MD;  Location: [place] CATH LAB;  Service: Cardiovascular;  Laterality: N/A;  SKIN BIOPSY    scc  TONSILLECTOMY AND ADENOIDECTOMY  1944  TRANSURETHRAL INCISION OF PROSTATE N/A 12/28/2012  Procedure: TRANSURETHRAL INCISION OF THE PROSTATE (TUIP);  Surgeon: Bernestine Amass, MD;  Location: [facility];  Service: Urology;  Laterality: N/A;  ULNAR NERVE TRANSPOSITION   HPI: 86 yo male admitted with abdominal pain and weakness, found to have sepsis from UTI. Pt reports difficulty swallowing for a few weeks PTA. CXR without acute findings. MBS in 2013 with normal appearing oropharyngeal swallow but with concern  for a primary esophageal component. PMH: Parkinson's disease, sick sinus node dysfunction s/p cardiac pacemaker, DM2, HLD, BPH, Afib on xarelto  Subjective: pt says he's been having increased trouble swallowing over the last few weeks  Recommendations for follow up therapy are one component of a multi-disciplinary discharge planning process, led by the attending physician.  Recommendations may be updated based on patient status, additional functional criteria and insurance authorization. Assessment / Plan / Recommendation   12/08/2021  11:00 AM Clinical Impressions Clinical Impression Pt presents with a mild oropharyngeal dysphagia. Lingual propulsion is mildly reduced and mastication is a little slow and effortful. Mild oral residue is noted, but pt manages this well with second swallows that he performs with Mod I. Premature spillage occurs primarily with liquids,  with thin and nectar thick liquids sitting as far as in the pyriform sinuses before the swallow. He also does not have quite full epiglottic inversion, pharyngeal squeeze, and laryngeal vestibule closure so that when thin liquids spill this far, he does have episodes of aspiration and penetration. Aspiration is sensed sometimes, but not necessarily when it is smaller amounts of penetration that end up falling past the vocal folds. Penetration with nectar thick liquids is trace, shallow, and clears upon subsequent swallows. Mild residue is noted around the level of the pyriform sinuses/UES, increasing as boluses become more solid. When also considering pt's subjective report of not feeling well and as if he can't swallow over the last few weeks, recommend trial of Dys 2 diet and nectar thick liquids with SLP f/u acutely. SLP Visit Diagnosis Dysphagia, oropharyngeal phase (R13.12) Impact on safety and function Mild aspiration risk;Moderate aspiration risk;Risk for inadequate nutrition/hydration     12/08/2021  11:00 AM Treatment Recommendations Treatment Recommendations Therapy as outlined in treatment plan below     12/08/2021  11:00 AM Prognosis Prognosis for Safe Diet Advancement Good   12/08/2021  11:00 AM Diet Recommendations SLP Diet Recommendations Dysphagia 2 (Fine chop) solids;Nectar thick liquid Liquid Administration via Straw Medication Administration Whole meds with puree Compensations Slow rate;Small sips/bites Postural Changes Seated upright at 90 degrees;Remain semi-upright after after feeds/meals (Comment)     12/08/2021  11:00 AM Other Recommendations Oral Care Recommendations Oral care BID Follow Up Recommendations Skilled nursing-short term rehab (<3 hours/day) Assistance recommended at discharge Frequent or constant Supervision/Assistance Functional Status Assessment Patient has had a recent decline in their functional status and demonstrates the ability to make significant improvements in function in  a reasonable and predictable amount of time.   12/08/2021  11:00 AM Frequency and Duration  Speech Therapy Frequency (ACUTE ONLY) min 2x/week Treatment Duration 2 weeks     12/08/2021  11:00 AM Oral Phase Oral Phase Impaired Oral - Nectar Straw Premature spillage;Lingual/palatal residue Oral - Thin Straw Premature spillage Oral - Puree Delayed oral transit Oral - Regular Impaired mastication;Delayed oral transit Oral - Pill Delayed oral transit    12/08/2021  11:00 AM Pharyngeal Phase Pharyngeal Phase Impaired Pharyngeal- Nectar Straw Delayed swallow initiation-pyriform sinuses;Reduced airway/laryngeal closure;Reduced epiglottic inversion;Reduced pharyngeal peristalsis;Pharyngeal residue - pyriform;Penetration/Aspiration before swallow;Penetration/Aspiration during swallow Pharyngeal Material enters airway, remains ABOVE vocal cords then ejected out Pharyngeal- Thin Straw Delayed swallow initiation-pyriform sinuses;Reduced airway/laryngeal closure;Penetration/Aspiration before swallow;Reduced epiglottic inversion;Reduced pharyngeal peristalsis;Pharyngeal residue - pyriform Pharyngeal Material enters airway, passes BELOW cords and not ejected out despite cough attempt by patient Pharyngeal- Puree Reduced airway/laryngeal closure;Reduced epiglottic inversion;Reduced pharyngeal peristalsis;Pharyngeal residue - pyriform Pharyngeal- Regular Reduced airway/laryngeal closure;Reduced epiglottic inversion;Reduced pharyngeal peristalsis;Pharyngeal residue - pyriform Pharyngeal- Pill Reduced  airway/laryngeal closure;Reduced epiglottic inversion;Reduced pharyngeal peristalsis;Delayed swallow initiation-vallecula    12/08/2021  11:00 AM Cervical Esophageal Phase  Cervical Esophageal Phase Impaired Osie Bond., M.A. [place] Office [phone] Secure chat preferred 12/08/2021, 12:48 PM                     ECHOCARDIOGRAM COMPLETE  Result Date: 12/07/2021    ECHOCARDIOGRAM REPORT   Patient Name:   AVELARDO REESMAN Date of Exam: 12/07/2021 Medical Rec #:  [id number]     Height:       66.0 in Accession #:    [id number]    Weight:       147.9 lb Date of Birth:  04-24-1935      BSA:          1.759 m Patient Age:    26 years      BP:           115/61 mmHg Patient Gender: M             HR:           75 bpm. Exam Location:  Inpatient Procedure: 2D Echo, Cardiac Doppler and Color Doppler Indications:    Elevated troponin  History:        Patient has prior history of Echocardiogram examinations, most                 recent 01/06/2013. Pacemaker, Arrythmias:Atrial Fibrillation;                 Risk Factors:Diabetes and Dyslipidemia. Parkinson's.  Sonographer:    Eartha Inch Referring Phys: [place]  Sonographer Comments: Technically difficult study due to poor echo windows. Image acquisition challenging due to respiratory motion and Image acquisition challenging due to patient body habitus. Image acquisition difficult due to patient having Parkinson's. IMPRESSIONS  1. Left ventricular ejection fraction, by estimation, is 55 to 60%. The left ventricle has normal function. The left ventricle has no regional wall motion abnormalities. Left ventricular diastolic parameters were normal.  2. Right ventricular systolic function is normal. The right ventricular size is normal. There is normal pulmonary artery systolic pressure. The estimated right ventricular systolic pressure is 65.7 mmHg.  3. The mitral valve is normal in structure. Trivial mitral valve regurgitation. No evidence of mitral stenosis.  4. The aortic valve is tricuspid. There is mild calcification of the aortic valve. There is mild thickening of the aortic valve. Aortic valve regurgitation is not visualized. No aortic stenosis is present. Comparison(s): No significant change from prior study. Conclusion(s)/Recommendation(s): Otherwise normal echocardiogram, with minor abnormalities described in the report. Technically difficult images, but overall LVEF is  normal without severe wall motion abnormalities. Reduced sensitivity for minor changes given image quality. FINDINGS  Left Ventricle: Left ventricular ejection fraction, by estimation, is 55 to 60%. The left ventricle has normal function. The left ventricle has no regional wall motion abnormalities. The left ventricular internal cavity size was normal in size. There is  no left ventricular hypertrophy. Left ventricular diastolic parameters were normal. Right Ventricle: The right ventricular size is normal. Right vetricular wall thickness was not well visualized. Right ventricular systolic function is normal. There is normal pulmonary artery systolic pressure. The tricuspid regurgitant velocity is 2.32 m/s, and with an assumed right atrial pressure of 8 mmHg, the estimated right ventricular systolic pressure is 84.6 mmHg. Left Atrium: Left atrial size was normal in size. Right Atrium: Right atrial size was not well visualized.  Pericardium: There is no evidence of pericardial effusion. Mitral Valve: The mitral valve is normal in structure. Trivial mitral valve regurgitation. No evidence of mitral valve stenosis. Tricuspid Valve: The tricuspid valve is normal in structure. Tricuspid valve regurgitation is mild . No evidence of tricuspid stenosis. Aortic Valve: The aortic valve is tricuspid. There is mild calcification of the aortic valve. There is mild thickening of the aortic valve. Aortic valve regurgitation is not visualized. No aortic stenosis is present. Pulmonic Valve: The pulmonic valve was not well visualized. Pulmonic valve regurgitation is not visualized. No evidence of pulmonic stenosis. Aorta: The aortic root, ascending aorta and aortic arch are all structurally normal, with no evidence of dilitation or obstruction. Venous: The inferior vena cava was not well visualized. IAS/Shunts: The interatrial septum was not well visualized. Additional Comments: A device lead is visualized in the right atrium and right  ventricle.  LEFT VENTRICLE PLAX 2D LVIDd:         4.60 cm   Diastology LVIDs:         3.70 cm   LV e' medial:    8.49 cm/s LV PW:         0.70 cm   LV E/e' medial:  8.1 LV IVS:        0.80 cm   LV e' lateral:   10.10 cm/s LVOT diam:     2.10 cm   LV E/e' lateral: 6.8 LVOT Area:     3.46 cm  RIGHT VENTRICLE RV S prime:     11.60 cm/s TAPSE (M-mode): 1.5 cm LEFT ATRIUM         Index LA diam:    2.60 cm 1.48 cm/m   AORTA Ao Root diam: 3.00 cm Ao Asc diam:  3.50 cm MITRAL VALVE               TRICUSPID VALVE MV Area (PHT): 2.68 cm    TR Peak grad:   21.5 mmHg MV Decel Time: 283 msec    TR Mean grad:   15.0 mmHg MV E velocity: 69.00 cm/s  TR Vmax:        232.00 cm/s MV A velocity: 70.90 cm/s  TR Vmean:       184.0 cm/s MV E/A ratio:  0.97                            SHUNTS                            Systemic Diam: 2.10 cm Buford Dresser MD Electronically signed by Buford Dresser MD Signature Date/Time: 12/07/2021/5:21:42 PM    Final      PERTINENT LAB RESULTS: CBC: Recent Labs    12/07/21 0000 12/08/21 1229  WBC 9.3 9.2  HGB 10.4* 10.7*  HCT 29.2* 30.4*  PLT 250 236   CMET CMP     Component Value Date/Time   NA 143 12/08/2021 1229   K 4.2 12/08/2021 1229   CL 111 12/08/2021 1229   CO2 22 12/08/2021 1229   GLUCOSE 132 (H) 12/08/2021 1229   GLUCOSE 108 (H) 02/20/2006 0838   BUN 11 12/08/2021 1229   CREATININE 0.55 (L) 12/08/2021 1229   CREATININE 0.93 12/07/2019 1520   CALCIUM 8.4 (L) 12/08/2021 1229   PROT 5.9 (L) 12/07/2021 0000   ALBUMIN 2.9 (L) 12/07/2021 0000   AST 19 12/07/2021 0000   ALT 6 12/07/2021  0000   ALKPHOS 48 12/07/2021 0000   BILITOT 0.4 12/07/2021 0000   GFRNONAA >60 12/08/2021 1229   GFRAA 123 09/10/2007 0832    GFR CrCl cannot be calculated (Unknown ideal weight.). Recent Labs    12/06/21 1257  LIPASE 18   No results for input(s): "CKTOTAL", "CKMB", "CKMBINDEX", "TROPONINI" in the last 72 hours. Invalid input(s): "POCBNP" No results for  input(s): "DDIMER" in the last 72 hours. Recent Labs    12/06/21 2109  HGBA1C 6.2*   No results for input(s): "CHOL", "HDL", "LDLCALC", "TRIG", "CHOLHDL", "LDLDIRECT" in the last 72 hours. No results for input(s): "TSH", "T4TOTAL", "T3FREE", "THYROIDAB" in the last 72 hours.  Invalid input(s): "FREET3" No results for input(s): "VITAMINB12", "FOLATE", "FERRITIN", "TIBC", "IRON", "RETICCTPCT" in the last 72 hours. Coags: No results for input(s): "INR" in the last 72 hours.  Invalid input(s): "PT" Microbiology: Recent Results (from the past 240 hour(s))  Culture, blood (routine x 2)     Status: None (Preliminary result)   Collection Time: 12/06/21 12:59 PM   Specimen: BLOOD  Result Value Ref Range Status   Specimen Description BLOOD SITE NOT SPECIFIED  Final   Special Requests   Final    BOTTLES DRAWN AEROBIC AND ANAEROBIC Blood Culture adequate volume   Culture   Final    NO GROWTH 2 DAYS Performed at [facility], [address] [place], [place] [address]    Report Status PENDING  Incomplete  Urine Culture     Status: Abnormal   Collection Time: 12/06/21  4:23 PM   Specimen: Urine, Clean Catch  Result Value Ref Range Status   Specimen Description URINE, CLEAN CATCH  Final   Special Requests NONE  Final   Culture (A)  Final    >=100,000 COLONIES/mL GROUP B STREP(S.AGALACTIAE)ISOLATED TESTING AGAINST S. AGALACTIAE NOT ROUTINELY PERFORMED DUE TO PREDICTABILITY OF AMP/PEN/VAN SUSCEPTIBILITY. Performed at [facility], [place] [address], [place], [place] [address]    Report Status 12/07/2021 FINAL  Final  Culture, blood (routine x 2)     Status: None (Preliminary result)   Collection Time: 12/06/21  4:28 PM   Specimen: BLOOD LEFT FOREARM  Result Value Ref Range Status   Specimen Description BLOOD LEFT FOREARM  Final   Special Requests   Final    BOTTLES DRAWN AEROBIC AND ANAEROBIC Blood Culture adequate volume   Culture   Final    NO GROWTH < 24 HOURS Performed  at [facility], [place] [address], [place], [place] [address]    Report Status PENDING  Incomplete  MRSA Next Gen by PCR, Nasal     Status: None   Collection Time: 12/07/21  7:45 PM   Specimen: Nasal Mucosa; Nasal Swab  Result Value Ref Range Status   MRSA by PCR Next Gen NOT DETECTED NOT DETECTED Final    Comment: (NOTE) The GeneXpert MRSA Assay (FDA approved for NASAL specimens only), is one component of a comprehensive MRSA colonization surveillance program. It is not intended to diagnose MRSA infection nor to guide or monitor treatment for MRSA infections. Test performance is not FDA approved in patients less than 2 years old. Performed at [facility], [place] [address], [place], [place] [address]     FURTHER DISCHARGE INSTRUCTIONS:  Get Medicines reviewed and adjusted: Please take all your medications with you for your next visit with your Primary MD  Laboratory/radiological data: Please request your Primary MD to go over all hospital tests and  procedure/radiological results at the follow up, please ask your Primary MD to get all Hospital records sent to his/her office.  In some cases, they will be blood work, cultures and biopsy results pending at the time of your discharge. Please request that your primary care M.D. goes through all the records of your hospital data and follows up on these results.  Also Note the following: If you experience worsening of your admission symptoms, develop shortness of breath, life threatening emergency, suicidal or homicidal thoughts you must seek medical attention immediately by calling 911 or calling your MD immediately  if symptoms less severe.  You must read complete instructions/literature along with all the possible adverse reactions/side effects for all the Medicines you take and that have been prescribed to you. Take any new Medicines after you have completely understood and accpet all the possible adverse reactions/side effects.    Do not drive when taking Pain medications or sleeping medications (Benzodaizepines)  Do not take more than prescribed Pain, Sleep and Anxiety Medications. It is not advisable to combine anxiety,sleep and pain medications without talking with your primary care practitioner  Special Instructions: If you have smoked or chewed Tobacco  in the last 2 yrs please stop smoking, stop any regular Alcohol  and or any Recreational drug use.  Wear Seat belts while driving.  Please note: You were cared for by a hospitalist during your hospital stay. Once you are discharged, your primary care physician will handle any further medical issues. Please note that NO REFILLS for any discharge medications will be authorized once you are discharged, as it is imperative that you return to your primary care physician (or establish a relationship with a primary care physician if you do not have one) for your post hospital discharge needs so that they can reassess your need for medications and monitor your lab values.  Total Time spent coordinating discharge including counseling, education and face to face time equals greater than 30 minutes.  Signed: Camyah Pultz 12/09/2021 9:00 AM

## 2021-12-09 NOTE — Progress Notes (Signed)
DISCHARGE NOTE [name] to be discharged Skilled nursing facility per MD order. Patient verbalized understanding.  Skin clean, dry and intact without evidence of skin break down, no evidence of skin tears noted. IV catheter discontinued intact. Site without signs and symptoms of complications. Dressing and pressure applied. Pt denies pain at the site currently. No complaints noted.  Patient free of lines, drains, and wounds.   Discharge packet assembled. An After Visit Summary (AVS) was printed and given to the EMS personnel. Patient escorted via stretcher and discharged to [facility] via ambulance. Report called to accepting facility; all questions and concerns addressed.   Arlyss Repress, RN

## 2021-12-09 NOTE — TOC Initial Note (Signed)
Transition of Care [facility]) - Initial/Assessment Note    Patient Details  Name: Stephen Robbins MRN: [id number] Date of Birth: Apr 21, 1935  [facility] CM/SW Contact:    Amador Cunas, [place] Phone Number: 12/09/2021, 10:56 AM  Clinical Narrative: Pt for dc back to [name] today where he is a LTC/SNF resident [facility]). Spoke to [facility] in admissions who confirmed they are prepared to admit pt today. Pt's niece aware of dc and reports agreeable. RN provided with number for report and PTAR arranged for transport. SW signing off at dc.   Wandra Feinstein, MSW, LCSW [phone] (coverage)                  Barriers to Discharge: No Barriers Identified   Patient Goals and CMS Choice        Expected Discharge Plan and Services           Expected Discharge Date: 12/09/21                                    Prior Living Arrangements/Services                       Activities of Daily Living      Permission Sought/Granted                  Emotional Assessment              Admission diagnosis:  Hypokalemia [E87.6] Generalized abdominal pain [R10.84] Acute UTI [N39.0] AKI (acute kidney injury) [facility]) [N17.9] Patient Active Problem List   Diagnosis Date Noted   Generalized abdominal pain 12/06/2021   Generalized weakness 12/06/2021   Dehydration 12/06/2021   Hypokalemia 12/06/2021   AKI (acute kidney injury) ([place]) 12/06/2021   UTI (urinary tract infection) 12/06/2021   Prolonged QT interval 12/06/2021   Lactic acidosis 12/06/2021   Mixed hyperlipidemia 12/06/2021   Diarrhea 12/06/2021   Primary osteoarthritis of right shoulder 03/16/2019   Diabetes mellitus type 2 in nonobese ([place]) 10/05/2018   PCP NOTES >>>>>>>>. 02/08/2018   Annual physical exam 02/06/2018   Paroxysmal atrial fibrillation ([place]) 02/23/2015   Chronotropic incompetence with sinus node dysfunction [facility])    Pacemaker - [employer] model  L121 serial number [id number] 01/04/2014   SSS (sick sinus syndrome) ([place]) 12/20/2013   Chronotropic incompetence with autonomic dysfunction 12/20/2013   Diabetic peripheral neuropathy ([place]) 06/17/2013   Parkinson's disease ([place]) 06/17/2013   NSVT (nonsustained ventricular tachycardia) ([place]) 12/18/2012   Bradycardia 11/26/2012   Dizziness, nonspecific 11/26/2012   CARPAL TUNNEL SYNDROME, BILATERAL 01/23/2010   HEARING LOSS 12/18/2009   Seasonal and perennial allergic rhinitis 03/26/2008   UNS ADVRS EFF UNS RX MEDICINAL&BIOLOGICAL SBSTNC 09/17/2007   HYPOGONADISM, MALE 04/23/2007   Anxiety and depression 02/10/2007   BPH (benign prostatic hyperplasia) 02/10/2007   OSTEOARTHRITIS 02/10/2007   LOW BACK PAIN 02/10/2007   HYPERGLYCEMIA, BORDERLINE 02/10/2007   Hypothyroidism 10/14/2006   Dyslipidemia 09/18/2006   PCP:  Javier Glazier, MD Pharmacy:   [id number], [place]. [name] [address] Phone: [phone] Fax: [facility], [address] [name] [address] Phone: [phone] Fax: [phone]     Social Determinants of Health (SDOH) Interventions    Readmission Risk  Interventions     No data to display

## 2021-12-10 DIAGNOSIS — R2689 Other abnormalities of gait and mobility: Secondary | ICD-10-CM | POA: Diagnosis not present

## 2021-12-10 DIAGNOSIS — M6281 Muscle weakness (generalized): Secondary | ICD-10-CM | POA: Diagnosis not present

## 2021-12-10 DIAGNOSIS — G20 Parkinson's disease: Secondary | ICD-10-CM | POA: Diagnosis not present

## 2021-12-10 DIAGNOSIS — R41841 Cognitive communication deficit: Secondary | ICD-10-CM | POA: Diagnosis not present

## 2021-12-10 DIAGNOSIS — R1312 Dysphagia, oropharyngeal phase: Secondary | ICD-10-CM | POA: Diagnosis not present

## 2021-12-10 DIAGNOSIS — N390 Urinary tract infection, site not specified: Secondary | ICD-10-CM | POA: Diagnosis not present

## 2021-12-10 DIAGNOSIS — I480 Paroxysmal atrial fibrillation: Secondary | ICD-10-CM | POA: Diagnosis not present

## 2021-12-10 DIAGNOSIS — R2681 Unsteadiness on feet: Secondary | ICD-10-CM | POA: Diagnosis not present

## 2021-12-11 DIAGNOSIS — G20 Parkinson's disease: Secondary | ICD-10-CM | POA: Diagnosis not present

## 2021-12-11 DIAGNOSIS — I480 Paroxysmal atrial fibrillation: Secondary | ICD-10-CM | POA: Diagnosis not present

## 2021-12-11 DIAGNOSIS — N390 Urinary tract infection, site not specified: Secondary | ICD-10-CM | POA: Diagnosis not present

## 2021-12-11 DIAGNOSIS — R2689 Other abnormalities of gait and mobility: Secondary | ICD-10-CM | POA: Diagnosis not present

## 2021-12-11 DIAGNOSIS — R2681 Unsteadiness on feet: Secondary | ICD-10-CM | POA: Diagnosis not present

## 2021-12-11 DIAGNOSIS — M6281 Muscle weakness (generalized): Secondary | ICD-10-CM | POA: Diagnosis not present

## 2021-12-11 LAB — CULTURE, BLOOD (ROUTINE X 2)
Culture: NO GROWTH
Special Requests: ADEQUATE

## 2021-12-12 DIAGNOSIS — R2689 Other abnormalities of gait and mobility: Secondary | ICD-10-CM | POA: Diagnosis not present

## 2021-12-12 DIAGNOSIS — R2681 Unsteadiness on feet: Secondary | ICD-10-CM | POA: Diagnosis not present

## 2021-12-12 DIAGNOSIS — I480 Paroxysmal atrial fibrillation: Secondary | ICD-10-CM | POA: Diagnosis not present

## 2021-12-12 DIAGNOSIS — F5101 Primary insomnia: Secondary | ICD-10-CM | POA: Diagnosis not present

## 2021-12-12 DIAGNOSIS — F411 Generalized anxiety disorder: Secondary | ICD-10-CM | POA: Diagnosis not present

## 2021-12-12 DIAGNOSIS — N390 Urinary tract infection, site not specified: Secondary | ICD-10-CM | POA: Diagnosis not present

## 2021-12-12 DIAGNOSIS — F331 Major depressive disorder, recurrent, moderate: Secondary | ICD-10-CM | POA: Diagnosis not present

## 2021-12-12 DIAGNOSIS — M6281 Muscle weakness (generalized): Secondary | ICD-10-CM | POA: Diagnosis not present

## 2021-12-12 DIAGNOSIS — G20 Parkinson's disease: Secondary | ICD-10-CM | POA: Diagnosis not present

## 2021-12-12 LAB — CULTURE, BLOOD (ROUTINE X 2)
Culture: NO GROWTH
Special Requests: ADEQUATE

## 2021-12-13 DIAGNOSIS — R2681 Unsteadiness on feet: Secondary | ICD-10-CM | POA: Diagnosis not present

## 2021-12-13 DIAGNOSIS — I480 Paroxysmal atrial fibrillation: Secondary | ICD-10-CM | POA: Diagnosis not present

## 2021-12-13 DIAGNOSIS — R2689 Other abnormalities of gait and mobility: Secondary | ICD-10-CM | POA: Diagnosis not present

## 2021-12-13 DIAGNOSIS — M6281 Muscle weakness (generalized): Secondary | ICD-10-CM | POA: Diagnosis not present

## 2021-12-13 DIAGNOSIS — N390 Urinary tract infection, site not specified: Secondary | ICD-10-CM | POA: Diagnosis not present

## 2021-12-13 DIAGNOSIS — G20 Parkinson's disease: Secondary | ICD-10-CM | POA: Diagnosis not present

## 2021-12-14 DIAGNOSIS — M6281 Muscle weakness (generalized): Secondary | ICD-10-CM | POA: Diagnosis not present

## 2021-12-14 DIAGNOSIS — I480 Paroxysmal atrial fibrillation: Secondary | ICD-10-CM | POA: Diagnosis not present

## 2021-12-14 DIAGNOSIS — G20 Parkinson's disease: Secondary | ICD-10-CM | POA: Diagnosis not present

## 2021-12-14 DIAGNOSIS — R2689 Other abnormalities of gait and mobility: Secondary | ICD-10-CM | POA: Diagnosis not present

## 2021-12-14 DIAGNOSIS — R2681 Unsteadiness on feet: Secondary | ICD-10-CM | POA: Diagnosis not present

## 2021-12-14 DIAGNOSIS — N390 Urinary tract infection, site not specified: Secondary | ICD-10-CM | POA: Diagnosis not present

## 2021-12-17 ENCOUNTER — Ambulatory Visit: Payer: Medicare Other | Admitting: Physician Assistant

## 2021-12-17 ENCOUNTER — Encounter: Payer: Self-pay | Admitting: Physician Assistant

## 2021-12-17 ENCOUNTER — Ambulatory Visit: Payer: Medicare Other | Attending: Physician Assistant | Admitting: Physician Assistant

## 2021-12-17 VITALS — BP 110/52 | HR 78 | Ht 69.0 in | Wt 130.2 lb

## 2021-12-17 DIAGNOSIS — G20 Parkinson's disease: Secondary | ICD-10-CM | POA: Insufficient documentation

## 2021-12-17 DIAGNOSIS — Z950 Presence of cardiac pacemaker: Secondary | ICD-10-CM | POA: Insufficient documentation

## 2021-12-17 DIAGNOSIS — N390 Urinary tract infection, site not specified: Secondary | ICD-10-CM | POA: Diagnosis not present

## 2021-12-17 DIAGNOSIS — I480 Paroxysmal atrial fibrillation: Secondary | ICD-10-CM | POA: Insufficient documentation

## 2021-12-17 DIAGNOSIS — E119 Type 2 diabetes mellitus without complications: Secondary | ICD-10-CM | POA: Diagnosis not present

## 2021-12-17 DIAGNOSIS — M6281 Muscle weakness (generalized): Secondary | ICD-10-CM | POA: Diagnosis not present

## 2021-12-17 DIAGNOSIS — R911 Solitary pulmonary nodule: Secondary | ICD-10-CM | POA: Diagnosis not present

## 2021-12-17 DIAGNOSIS — R2681 Unsteadiness on feet: Secondary | ICD-10-CM | POA: Diagnosis not present

## 2021-12-17 DIAGNOSIS — R2689 Other abnormalities of gait and mobility: Secondary | ICD-10-CM | POA: Diagnosis not present

## 2021-12-17 DIAGNOSIS — R531 Weakness: Secondary | ICD-10-CM | POA: Insufficient documentation

## 2021-12-17 NOTE — Progress Notes (Signed)
Cardiology Office Note:    Date:  12/17/2021   ID:  [name], DOB Jun 08, 1935, MRN [id number]  PCP:  Javier Glazier, MD   [facility] Cardiologist:  Sanda Klein, MD     Referring MD: Javier Glazier, MD   Chief Complaint  Patient presents with   Hospitalization Follow-up    Recent admission for urinary tract infection.  Overdue for follow-up.    History of Present Illness:    Stephen Robbins is a 86 y.o. male with a hx of PAF on Xarelto detected on pacemaker interrogation, SSS s/p [employer] dual-chamber PPM platlet 06/02/13, chronotropic incompetence, Parkinson's disease, DM 2, asymptomatic brief nonsustained VT detected on pacemaker.  Patient resides at [facility].  Unfortunately his wife passed away in June 02, 2018.  Patient was last seen by Dr. Sallyanne Kuster in November 2022, device interrogation at the time showed a very low A-fib burden of less than 1%.  Dr. Sallyanne Kuster was concerned that some of his falling episode may be related to orthostatic dizziness.  Last device interrogation in January 2023 showed only 3-minute episode of A-fib.  More recently, patient was admitted from 9/14 until 9/17 with weakness and confusion the lower abdominal pain.  Patient was found to have complicated urinary tract infection and was treated with antibiotic.  Echocardiogram obtained on 12/07/2021 showed EF 55 to 60%, no regional wall motion abnormality.  CT of abdomen and pelvis revealed tiny nonobstructive renal calculi, bladder thickening, several 6 mm groundglass opacity in the left lower lobe.  It was recommended for repeat CT imaging in 3 to 6 months to follow-up.  He was treated for sepsis with Rocephin.  AKI resolved.  Troponin was mildly elevated (29-->109-->245-->340) likely due to demand ischemia.  Patient presents today for follow-up.  He denies any chest pain.  Talking with his caretaker, patient is largely wheelchair-bound at this point.  He is able to get up and exercise  with physical therapy, however spends most of the time in the wheelchair.  His functional ability is extremely limited.  I asked him why he has not had any remote transmission from his pacemaker since January.  According to his caretaker, he moved out of from his old apartment in 06/02/22, everything is currently sealed in boxes, including the remote transmitter for his pacemaker.  He remains extremely weak since discharge.  He is voices very soft.  He appears to be euvolemic on exam.  Her lung is clear.  He has not had any falling episode especially since he is now wheelchair-bound.  Discharge paperwork from the hospital mentioned that patient may benefit from palliative care follow-up at skilled nursing facility.  It is also recommended for the patient to follow-up with PCP in 1 to 2 weeks to have blood work and also repeat CT image on the left lower lobe lung nodule.  I am going to bring the patient back in 3 months for follow-up with Dr. Sallyanne Kuster or with me on a day Dr. Sallyanne Kuster is also in the office so he can get the device interrogation.  Unfortunately when he upgraded from independent living facility to skilled nursing side at [facility], everything in the old apartment was packing boxes and the it would be difficult for him to open everything up to find the pacemaker transmitter.  I will discuss this with Dr. Sallyanne Kuster.  Past Medical History:  Diagnosis Date   Arthritis    "joints; a little" (01/04/2014)   Asymptomatic bilateral carotid artery  stenosis    per duplex  05-15-2012  left >39%/   right 40-59%   Basal cell carcinoma    nose   Borderline diabetes    BPH (benign prostatic hypertrophy) with urinary obstruction    Bradycardia    Bronchial pneumonia 1958   Chronotropic incompetence with sinus node dysfunction (HCC)    Compressed cervical disc    Compression of lumbar vertebra (HCC)    L4 -- L5   Dizzy    Dysmetabolic syndrome    Fatigue    Frequency of urination    GERD  (gastroesophageal reflux disease)    occasional   Hemorrhoids    Hyperlipidemia    Hypothyroidism    Nocturia    NSVT (nonsustained ventricular tachycardia) [facility])    cardiologist-  dr croitoru   Pacemaker    Urgency of urination    Wears glasses     Past Surgical History:  Procedure Laterality Date   BASAL CELL CARCINOMA EXCISION     nose   CLOSED REDUCTION NASAL FRACTURE  09-01-2007   CYSTOSCOPY N/A 12/28/2012   Procedure: CYSTOSCOPY;  Surgeon: Bernestine Amass, MD;  [facility];  Service: Urology;  Laterality: N/A;   EXERCISE TOLERENCE TEST  12-03-2012  DR CROITURO   CHRONOTROPIC INCOMPETENCE/ NORMAL RESTING BP W/ APPROPRIATE RESPONSE/ NO CHEST PAIN/ NO ST CHANGES FROM BASELINE   INSERT / REPLACE / REMOVE PACEMAKER  01/04/2014   [employer] model L121 serial number [id number]   LACERATION REPAIR Right 1978   middle finger   NEUROPLASTY / TRANSPOSITION ULNAR NERVE AT ELBOW Left 02-09-2010   PERMANENT PACEMAKER INSERTION N/A 01/04/2014   Procedure: PERMANENT PACEMAKER INSERTION;  Surgeon: Sanda Klein, MD;  Location: [place] [facility];  Service: Cardiovascular;  Laterality: N/A;   SKIN BIOPSY     scc   TONSILLECTOMY AND ADENOIDECTOMY  1944   TRANSURETHRAL INCISION OF PROSTATE N/A 12/28/2012   Procedure: TRANSURETHRAL INCISION OF THE PROSTATE (TUIP);  Surgeon: Bernestine Amass, MD;  Location: [facility];  Service: Urology;  Laterality: N/A;   ULNAR NERVE TRANSPOSITION      Current Medications: Current Meds  Medication Sig   ACCU-CHEK FASTCLIX LANCETS MISC Check blood sugar three times daily   blood glucose meter kit and supplies Dispense based on patient and insurance preference. Check blood sugars daily and as needed Dx: e11.9   Blood Pressure KIT Take blood pressure twice weekly.   buPROPion (WELLBUTRIN) 75 MG tablet TAKE 1 TABLET(75 MG) BY MOUTH TWICE DAILY (Patient taking differently: Take 75 mg by mouth 2 (two) times daily.)    carbidopa-levodopa (SINEMET IR) 25-100 MG tablet Take 2 tablets by mouth 3 (three) times daily.   Cobalamin Combinations (B12 FOLATE PO) Take 2 tablets by mouth daily at 12 noon.   FARXIGA 10 MG TABS tablet TAKE 1 TABLET(10 MG) BY MOUTH DAILY (Patient taking differently: Take 10 mg by mouth daily.)   GEMTESA 75 MG TABS Take 75 mg by mouth daily.   ibuprofen (ADVIL) 400 MG tablet Take 400 mg by mouth every 6 (six) hours as needed for mild pain.   levothyroxine (SYNTHROID) 75 MCG tablet TAKE 1 TABLET(75 MCG) BY MOUTH DAILY BEFORE AND BREAKFAST (Patient taking differently: Take 75 mcg by mouth daily before breakfast.)   loperamide (IMODIUM A-D) 2 MG tablet Take 4 mg by mouth daily.   Melatonin 10 MG TABS Take 10 mg by mouth at bedtime.   metFORMIN (GLUCOPHAGE) 1000 MG tablet TAKE  1 TABLET(1000 MG) BY MOUTH TWICE DAILY WITH A MEAL (Patient taking differently: Take 1,000 mg by mouth 2 (two) times daily with a meal. Take 1 tablet daily at 0600 and 1 tablet daily at 1400)   psyllium (REGULOID) 0.52 g capsule Take 0.52 g by mouth daily.   rivaroxaban (XARELTO) 20 MG TABS tablet Take 1 tablet (20 mg total) by mouth daily with supper. NEEDS CARDIOLOGY APPOINTMENT, PLEASE CALL OFFICE   sertraline (ZOLOFT) 100 MG tablet Take 1 tablet (100 mg total) by mouth daily.   simvastatin (ZOCOR) 40 MG tablet Take 1 tablet (40 mg total) by mouth at bedtime.   tamsulosin (FLOMAX) 0.4 MG CAPS capsule Take 0.4 mg by mouth at bedtime.   traZODone (DESYREL) 50 MG tablet Take 50 mg by mouth at bedtime.     Allergies:   Covid-19 mrna vacc (moderna) and Penicillins   Social History   Socioeconomic History   Marital status: Widowed    Spouse name: Not on file   Number of children: 0   Years of education: Not on file   Highest education level: Bachelor's degree (e.g., BA, AB, BS)  Occupational History   Occupation: retired    Fish farm manager: RETIRED    Comment: executive   Tobacco Use   Smoking status: Former     Packs/day: 1.00    Years: 10.00    Total pack years: 10.00    Types: Cigarettes    Quit date: 03/25/1968    Years since quitting: 53.7   Smokeless tobacco: Never  Vaping Use   Vaping Use: Never used  Substance and Sexual Activity   Alcohol use: Yes    Alcohol/week: 7.0 standard drinks of alcohol    Types: 7 Glasses of wine per week    Comment: 1 glass wine daily   Drug use: No   Sexual activity: Not Currently  Other Topics Concern   Not on file  Social History Narrative   Lives at [facility] independent living   Left handed   Lives on 2nd level   Lost wife 01/21/2019, dementia      Social Determinants of Health   Financial Resource Strain: Not on file  Food Insecurity: Not on file  Transportation Needs: Not on file  Physical Activity: Not on file  Stress: Not on file  Social Connections: Not on file     Family History: The patient's family history includes Heart Problems in his brother; Lung cancer in his mother; Parkinson's disease in his brother; Stroke in his father.  ROS:   Please see the history of present illness.     All other systems reviewed and are negative.  EKGs/Labs/Other Studies Reviewed:    The following studies were reviewed today:  Echo 12/07/2021 1. Left ventricular ejection fraction, by estimation, is 55 to 60%. The  left ventricle has normal function. The left ventricle has no regional  wall motion abnormalities. Left ventricular diastolic parameters were  normal.   2. Right ventricular systolic function is normal. The right ventricular  size is normal. There is normal pulmonary artery systolic pressure. The  estimated right ventricular systolic pressure is 43.3 mmHg.   3. The mitral valve is normal in structure. Trivial mitral valve  regurgitation. No evidence of mitral stenosis.   4. The aortic valve is tricuspid. There is mild calcification of the  aortic valve. There is mild thickening of the aortic valve. Aortic valve  regurgitation is  not visualized. No aortic stenosis is present.   Comparison(s): No  significant change from prior study.   Conclusion(s)/Recommendation(s): Otherwise normal echocardiogram, with  minor abnormalities described in the report. Technically difficult images,  but overall LVEF is normal without severe wall motion abnormalities.  Reduced sensitivity for minor changes  given image quality.   EKG:  EKG is not ordered today.   Recent Labs: 12/07/2021: ALT 6 12/08/2021: Hemoglobin 10.7; Platelets 236 12/09/2021: BUN 5; Creatinine, Ser 0.59; Magnesium 1.3; Potassium 4.0; Sodium 143  Recent Lipid Panel    Component Value Date/Time   CHOL 202 (H) 10/17/2020 1456   TRIG 146.0 10/17/2020 1456   TRIG 83 02/20/2006 0838   HDL 39.50 10/17/2020 1456   CHOLHDL 5 10/17/2020 1456   VLDL 29.2 10/17/2020 1456   LDLCALC 134 (H) 10/17/2020 1456   LDLCALC 87 12/07/2019 1520     Risk Assessment/Calculations:    CHA2DS2-VASc Score = 3   This indicates a 3.2% annual risk of stroke. The patient's score is based upon: CHF History: 0 HTN History: 0 Diabetes History: 1 Stroke History: 0 Vascular Disease History: 0 Age Score: 2 Gender Score: 0          Physical Exam:    VS:  BP (!) 110/52   Pulse 78   Ht 5' 9"  (1.753 m)   Wt 130 lb 3.2 oz (59.1 kg)   SpO2 98%   BMI 19.23 kg/m         Wt Readings from Last 3 Encounters:  12/17/21 130 lb 3.2 oz (59.1 kg)  03/19/21 147 lb 14.9 oz (67.1 kg)  02/27/21 148 lb (67.1 kg)     GEN: Weak, sitting in wheelchair. HEENT: Normal NECK: No JVD; No carotid bruits LYMPHATICS: No lymphadenopathy CARDIAC: RRR, no murmurs, rubs, gallops RESPIRATORY:  Clear to auscultation without rales, wheezing or rhonchi  ABDOMEN: Soft, non-tender, non-distended MUSCULOSKELETAL:  No edema; No deformity  SKIN: Warm and dry NEUROLOGIC:  Alert and oriented x 3 PSYCHIATRIC:  Normal affect   ASSESSMENT:    1. Weakness   2. PAF (paroxysmal atrial fibrillation) ([place])    3. Pacemaker - [employer] serial number [id number]   4. Parkinson's disease ([place])   5. Controlled type 2 diabetes mellitus without complication, without long-term current use of insulin (HCC)   6. Lung nodule    PLAN:    In order of problems listed above:  Weakness: Recently admitted to the hospital with complicated UTI.  Echocardiogram was normal.  Patient was treated with antibiotic.  Due for repeat blood work including CMP, CBC and magnesium.  Patient was supposed to follow-up with PCP afterward.  Talk with caretaker, patient is largely dependent on wheelchair at this time.  His functional ability is quite limited.  He has been transition from independent living to skilled [place].  PAF: Seen on previous device interrogation.  A-fib burden has always been very low.  No recent falls.  On Xarelto  History of sinus node dysfunction status post [employer] PPM: He has not had a device interrogation since January.  Talking with the patient, it appears patient was moved from independent living to skilled nursing facility at [facility] since February.  Everything in his old apartment was him boxes at the he has no axis to the device transmitter.  Parkinson's disease: Wheelchair-bound at this time  DM2: Managed by primary care provider  Lung nodule: Seen on recent CT image.  Discharge paperwork from the hospital recommended 3 to 73-monthfollow-up image for the left lower lobe lung  nodule.           Medication Adjustments/Labs and Tests Ordered: Current medicines are reviewed at length with the patient today.  Concerns regarding medicines are outlined above.  Orders Placed This Encounter  Procedures   CBC   Comprehensive Metabolic Panel (CMET)   Magnesium   No orders of the defined types were placed in this encounter.   Patient Instructions  Medication Instructions:  Your physician recommends that you continue on your current  medications as directed. Please refer to the Current Medication list given to you today.  *If you need a refill on your cardiac medications before your next appointment, please call your pharmacy*   Lab Work: Your physician recommends that you have the following lab drawn: CBC, CMP, and Magnesium  If you have labs (blood work) drawn today and your tests are completely normal, you will receive your results only by: [place] (if you have MyChart) OR A paper copy in the mail If you have any lab test that is abnormal or we need to change your treatment, we will call you to review the results.   Testing/Procedures: NONE ordered at this time of appointment     Follow-Up: At [facility], you and your health needs are our priority.  As part of our continuing mission to provide you with exceptional heart care, we have created designated Provider Care Teams.  These Care Teams include your primary Cardiologist (physician) and Advanced Practice Providers (APPs -  Physician Assistants and Nurse Practitioners) who all work together to provide you with the care you need, when you need it.  We recommend signing up for the patient portal called "MyChart".  Sign up information is provided on this After Visit Summary.  MyChart is used to connect with patients for Virtual Visits (Telemedicine).  Patients are able to view lab/test results, encounter notes, upcoming appointments, etc.  Non-urgent messages can be sent to your provider as well.   To learn more about what you can do with MyChart, go to [email or web address].    Your next appointment:   3 month(s)  The format for your next appointment:   In Person  Provider:   Sanda Klein, MD Or Almyra Deforest, PA on a day that Dr. Sallyanne Kuster is I the office.    Hilbert Corrigan, [place]  12/17/2021 3:17 PM    Tea

## 2021-12-17 NOTE — Patient Instructions (Signed)
Medication Instructions:  Your physician recommends that you continue on your current medications as directed. Please refer to the Current Medication list given to you today.  *If you need a refill on your cardiac medications before your next appointment, please call your pharmacy*   Lab Work: Your physician recommends that you have the following lab drawn: CBC, CMP, and Magnesium  If you have labs (blood work) drawn today and your tests are completely normal, you will receive your results only by: [place] (if you have MyChart) OR A paper copy in the mail If you have any lab test that is abnormal or we need to change your treatment, we will call you to review the results.   Testing/Procedures: NONE ordered at this time of appointment     Follow-Up: At [facility], you and your health needs are our priority.  As part of our continuing mission to provide you with exceptional heart care, we have created designated Provider Care Teams.  These Care Teams include your primary Cardiologist (physician) and Advanced Practice Providers (APPs -  Physician Assistants and Nurse Practitioners) who all work together to provide you with the care you need, when you need it.  We recommend signing up for the patient portal called "MyChart".  Sign up information is provided on this After Visit Summary.  MyChart is used to connect with patients for Virtual Visits (Telemedicine).  Patients are able to view lab/test results, encounter notes, upcoming appointments, etc.  Non-urgent messages can be sent to your provider as well.   To learn more about what you can do with MyChart, go to [email or web address].    Your next appointment:   3 month(s)  The format for your next appointment:   In Person  Provider:   Sanda Klein, MD Or Almyra Deforest, PA on a day that Dr. Sallyanne Kuster is I the office.

## 2021-12-18 DIAGNOSIS — I480 Paroxysmal atrial fibrillation: Secondary | ICD-10-CM | POA: Diagnosis not present

## 2021-12-18 DIAGNOSIS — N390 Urinary tract infection, site not specified: Secondary | ICD-10-CM | POA: Diagnosis not present

## 2021-12-18 DIAGNOSIS — M6281 Muscle weakness (generalized): Secondary | ICD-10-CM | POA: Diagnosis not present

## 2021-12-18 DIAGNOSIS — G20 Parkinson's disease: Secondary | ICD-10-CM | POA: Diagnosis not present

## 2021-12-18 DIAGNOSIS — R2689 Other abnormalities of gait and mobility: Secondary | ICD-10-CM | POA: Diagnosis not present

## 2021-12-18 DIAGNOSIS — R2681 Unsteadiness on feet: Secondary | ICD-10-CM | POA: Diagnosis not present

## 2021-12-18 LAB — CBC
Hematocrit: 30.9 % — ABNORMAL LOW (ref 37.5–51.0)
Hemoglobin: 10.6 g/dL — ABNORMAL LOW (ref 13.0–17.7)
MCH: 33.2 pg — ABNORMAL HIGH (ref 26.6–33.0)
MCHC: 34.3 g/dL (ref 31.5–35.7)
MCV: 97 fL (ref 79–97)
Platelets: 228 10*3/uL (ref 150–450)
RBC: 3.19 x10E6/uL — ABNORMAL LOW (ref 4.14–5.80)
RDW: 15.6 % — ABNORMAL HIGH (ref 11.6–15.4)
WBC: 7.4 10*3/uL (ref 3.4–10.8)

## 2021-12-18 LAB — COMPREHENSIVE METABOLIC PANEL
ALT: 11 IU/L (ref 0–44)
AST: 20 IU/L (ref 0–40)
Albumin/Globulin Ratio: 1.4 (ref 1.2–2.2)
Albumin: 3.6 g/dL — ABNORMAL LOW (ref 3.7–4.7)
Alkaline Phosphatase: 75 IU/L (ref 44–121)
BUN/Creatinine Ratio: 14 (ref 10–24)
BUN: 8 mg/dL (ref 8–27)
Bilirubin Total: 0.4 mg/dL (ref 0.0–1.2)
CO2: 23 mmol/L (ref 20–29)
Calcium: 8.8 mg/dL (ref 8.6–10.2)
Chloride: 97 mmol/L (ref 96–106)
Creatinine, Ser: 0.58 mg/dL — ABNORMAL LOW (ref 0.76–1.27)
Globulin, Total: 2.5 g/dL (ref 1.5–4.5)
Glucose: 107 mg/dL — ABNORMAL HIGH (ref 70–99)
Potassium: 4.7 mmol/L (ref 3.5–5.2)
Sodium: 136 mmol/L (ref 134–144)
Total Protein: 6.1 g/dL (ref 6.0–8.5)
eGFR: 96 mL/min/{1.73_m2} (ref >59)

## 2021-12-18 LAB — MAGNESIUM: Magnesium: 1.6 mg/dL (ref 1.6–2.3)

## 2021-12-19 ENCOUNTER — Ambulatory Visit (INDEPENDENT_AMBULATORY_CARE_PROVIDER_SITE_OTHER): Payer: Medicare Other

## 2021-12-19 DIAGNOSIS — M6281 Muscle weakness (generalized): Secondary | ICD-10-CM | POA: Diagnosis not present

## 2021-12-19 DIAGNOSIS — I495 Sick sinus syndrome: Secondary | ICD-10-CM | POA: Diagnosis not present

## 2021-12-19 DIAGNOSIS — R2689 Other abnormalities of gait and mobility: Secondary | ICD-10-CM | POA: Diagnosis not present

## 2021-12-19 DIAGNOSIS — G20 Parkinson's disease: Secondary | ICD-10-CM | POA: Diagnosis not present

## 2021-12-19 DIAGNOSIS — R2681 Unsteadiness on feet: Secondary | ICD-10-CM | POA: Diagnosis not present

## 2021-12-19 DIAGNOSIS — I480 Paroxysmal atrial fibrillation: Secondary | ICD-10-CM | POA: Diagnosis not present

## 2021-12-19 DIAGNOSIS — N390 Urinary tract infection, site not specified: Secondary | ICD-10-CM | POA: Diagnosis not present

## 2021-12-19 LAB — CUP PACEART REMOTE DEVICE CHECK
Battery Remaining Longevity: 54 mo
Battery Remaining Percentage: 51 %
Brady Statistic RA Percent Paced: 25 %
Brady Statistic RV Percent Paced: 3 %
Date Time Interrogation Session: 20230926141200
Implantable Lead Implant Date: 20151013
Implantable Lead Implant Date: 20151013
Implantable Lead Location: 753859
Implantable Lead Location: 753860
Implantable Lead Model: 4135
Implantable Lead Model: 4136
Implantable Lead Serial Number: 29480373
Implantable Lead Serial Number: 29608302
Implantable Pulse Generator Implant Date: 20151013
Lead Channel Impedance Value: 497 Ohm
Lead Channel Impedance Value: 515 Ohm
Lead Channel Setting Pacing Amplitude: 2 V
Lead Channel Setting Pacing Amplitude: 4 V
Lead Channel Setting Pacing Pulse Width: 1 ms
Lead Channel Setting Sensing Sensitivity: 4 mV
Pulse Gen Serial Number: 702951

## 2021-12-20 DIAGNOSIS — I480 Paroxysmal atrial fibrillation: Secondary | ICD-10-CM | POA: Diagnosis not present

## 2021-12-20 DIAGNOSIS — N390 Urinary tract infection, site not specified: Secondary | ICD-10-CM | POA: Diagnosis not present

## 2021-12-20 DIAGNOSIS — R2689 Other abnormalities of gait and mobility: Secondary | ICD-10-CM | POA: Diagnosis not present

## 2021-12-20 DIAGNOSIS — R2681 Unsteadiness on feet: Secondary | ICD-10-CM | POA: Diagnosis not present

## 2021-12-20 DIAGNOSIS — G20 Parkinson's disease: Secondary | ICD-10-CM | POA: Diagnosis not present

## 2021-12-20 DIAGNOSIS — M6281 Muscle weakness (generalized): Secondary | ICD-10-CM | POA: Diagnosis not present

## 2021-12-24 DIAGNOSIS — R1312 Dysphagia, oropharyngeal phase: Secondary | ICD-10-CM | POA: Diagnosis not present

## 2021-12-24 DIAGNOSIS — I480 Paroxysmal atrial fibrillation: Secondary | ICD-10-CM | POA: Diagnosis not present

## 2021-12-24 DIAGNOSIS — R2681 Unsteadiness on feet: Secondary | ICD-10-CM | POA: Diagnosis not present

## 2021-12-24 DIAGNOSIS — R2689 Other abnormalities of gait and mobility: Secondary | ICD-10-CM | POA: Diagnosis not present

## 2021-12-24 DIAGNOSIS — M6281 Muscle weakness (generalized): Secondary | ICD-10-CM | POA: Diagnosis not present

## 2021-12-24 DIAGNOSIS — G20B2 Parkinson's disease with dyskinesia, with fluctuations: Secondary | ICD-10-CM | POA: Diagnosis not present

## 2021-12-24 DIAGNOSIS — R41841 Cognitive communication deficit: Secondary | ICD-10-CM | POA: Diagnosis not present

## 2021-12-24 DIAGNOSIS — N390 Urinary tract infection, site not specified: Secondary | ICD-10-CM | POA: Diagnosis not present

## 2021-12-25 DIAGNOSIS — N390 Urinary tract infection, site not specified: Secondary | ICD-10-CM | POA: Diagnosis not present

## 2021-12-25 DIAGNOSIS — R2689 Other abnormalities of gait and mobility: Secondary | ICD-10-CM | POA: Diagnosis not present

## 2021-12-25 DIAGNOSIS — I480 Paroxysmal atrial fibrillation: Secondary | ICD-10-CM | POA: Diagnosis not present

## 2021-12-25 DIAGNOSIS — R1312 Dysphagia, oropharyngeal phase: Secondary | ICD-10-CM | POA: Diagnosis not present

## 2021-12-25 DIAGNOSIS — G20B2 Parkinson's disease with dyskinesia, with fluctuations: Secondary | ICD-10-CM | POA: Diagnosis not present

## 2021-12-25 DIAGNOSIS — M6281 Muscle weakness (generalized): Secondary | ICD-10-CM | POA: Diagnosis not present

## 2021-12-26 DIAGNOSIS — G20B2 Parkinson's disease with dyskinesia, with fluctuations: Secondary | ICD-10-CM | POA: Diagnosis not present

## 2021-12-26 DIAGNOSIS — R2689 Other abnormalities of gait and mobility: Secondary | ICD-10-CM | POA: Diagnosis not present

## 2021-12-26 DIAGNOSIS — I480 Paroxysmal atrial fibrillation: Secondary | ICD-10-CM | POA: Diagnosis not present

## 2021-12-26 DIAGNOSIS — N390 Urinary tract infection, site not specified: Secondary | ICD-10-CM | POA: Diagnosis not present

## 2021-12-26 DIAGNOSIS — R1312 Dysphagia, oropharyngeal phase: Secondary | ICD-10-CM | POA: Diagnosis not present

## 2021-12-26 DIAGNOSIS — M6281 Muscle weakness (generalized): Secondary | ICD-10-CM | POA: Diagnosis not present

## 2021-12-26 NOTE — Progress Notes (Deleted)
Assessment/Plan:   1.  Parkinsons Disease  -continue carbidopa/levodopa 25/100, 2 tablets 3 times per day.  Currently taking it with protein.  Discussed interaction with protein.  -Has some dyskinesia, but not that bothersome and does not require medication.  He and I discussed nature of dyskinesia today.  Patient does not even notice the dyskinesia.  -Patient is starting physical therapy at [facility].   2.  Diabetic peripheral neuropathy  -On gabapentin, 300 mg bid  3.  Depression  -On sertraline, 100 mg daily.  Used to be on 100 mg bid.  Unclear why changed  -pcp managing  -Patient continues to have very significant depression associated with the loss of his wife.    4.  Visual hallucinations  -This really only happens with his wife, and I suspect that this is depression related psychosis as opposed to Parkinson's related psychosis.   -The caregiver states that she has only been back with him this week, and there was another caregiver there previously.  That company just came back into the patient's care.  I asked her to watch this, and if she notes consistent hallucinations (not just associated with the wife) to call me and we can potentially start Nuplazid.  Discussed follow-up.  Discussed whether or not to continue to follow-up here.  It has been difficult getting him into the office.  He has had several no-shows, presumably due to transportation difficulties (these were after work-in appointments were requested).  Subjective:   Stephen Robbins was seen today in follow up for Parkinsons disease.  My previous records were reviewed prior to todays visit as well as outside records available to me. Pt with caregiver who supplements hx.   I have not seen him in almost a year.  He had an appointment in June, but no showed that.  Medical records made available to me are reviewed.  Patient is in full-time SNF care at [facility].  Notes are reviewed.  Patient was in the hospital from September  14 until December 09, 2021.  Patient presented with weakness, confusion and lower abdominal pain.  Patient with complicated UTI.  Patient admitted to hospital and noted to have nonobstructing renal calculus.  Patient also had hypokalemia, hypophosphatemia, hypomagnesemia.  Those were repleted in the hospital.  Patient is mostly wheelchair-bound, but does walk some per caregiver.  This was the case even prior to hospitalization.  Current movement disorder medications: Carbidopa/levodopa 25/100, 2 tablets 3 times per day (states taking breakfast, lunch dinner) Gabapentin, 300 mg twice per day Sertraline, 100 mg daily   ALLERGIES:   Allergies  Allergen Reactions   Covid-19 Mrna Vacc (Moderna)     "Couldn't move". No rash, no itching    Penicillins Other (See Comments)    Unknown childhood reaction    CURRENT MEDICATIONS:  Outpatient Encounter Medications as of 12/28/2021  Medication Sig   ACCU-CHEK FASTCLIX LANCETS MISC Check blood sugar three times daily   blood glucose meter kit and supplies Dispense based on patient and insurance preference. Check blood sugars daily and as needed Dx: e11.9   Blood Pressure KIT Take blood pressure twice weekly.   buPROPion (WELLBUTRIN) 75 MG tablet TAKE 1 TABLET(75 MG) BY MOUTH TWICE DAILY (Patient taking differently: Take 75 mg by mouth 2 (two) times daily.)   carbidopa-levodopa (SINEMET IR) 25-100 MG tablet Take 2 tablets by mouth 3 (three) times daily.   Cobalamin Combinations (B12 FOLATE PO) Take 2 tablets by mouth daily at 12 noon.  FARXIGA 10 MG TABS tablet TAKE 1 TABLET(10 MG) BY MOUTH DAILY (Patient taking differently: Take 10 mg by mouth daily.)   GEMTESA 75 MG TABS Take 75 mg by mouth daily.   ibuprofen (ADVIL) 400 MG tablet Take 400 mg by mouth every 6 (six) hours as needed for mild pain.   levothyroxine (SYNTHROID) 75 MCG tablet TAKE 1 TABLET(75 MCG) BY MOUTH DAILY BEFORE AND BREAKFAST (Patient taking differently: Take 75 mcg by mouth daily  before breakfast.)   loperamide (IMODIUM A-D) 2 MG tablet Take 4 mg by mouth daily.   Melatonin 10 MG TABS Take 10 mg by mouth at bedtime.   metFORMIN (GLUCOPHAGE) 1000 MG tablet TAKE 1 TABLET(1000 MG) BY MOUTH TWICE DAILY WITH A MEAL (Patient taking differently: Take 1,000 mg by mouth 2 (two) times daily with a meal. Take 1 tablet daily at 0600 and 1 tablet daily at 1400)   psyllium (REGULOID) 0.52 g capsule Take 0.52 g by mouth daily.   rivaroxaban (XARELTO) 20 MG TABS tablet Take 1 tablet (20 mg total) by mouth daily with supper. NEEDS CARDIOLOGY APPOINTMENT, PLEASE CALL OFFICE   sertraline (ZOLOFT) 100 MG tablet Take 1 tablet (100 mg total) by mouth daily.   simvastatin (ZOCOR) 40 MG tablet Take 1 tablet (40 mg total) by mouth at bedtime.   tamsulosin (FLOMAX) 0.4 MG CAPS capsule Take 0.4 mg by mouth at bedtime.   traZODone (DESYREL) 50 MG tablet Take 50 mg by mouth at bedtime.   No facility-administered encounter medications on file as of 12/28/2021.    Objective:   PHYSICAL EXAMINATION:    VITALS:   There were no vitals filed for this visit.   Wt Readings from Last 3 Encounters:  12/17/21 130 lb 3.2 oz (59.1 kg)  03/19/21 147 lb 14.9 oz (67.1 kg)  02/27/21 148 lb (67.1 kg)     GEN:  The patient appears stated age and is in NAD.  He appears frail HEENT:  Normocephalic, atraumatic.  Right eye is just slightly irritated, as he just had cataract surgery.  The mucous membranes are moist. The superficial temporal arteries are without ropiness or tenderness. CV:  RRR Lungs:  CTAB Neck/HEME:  There are no carotid bruits bilaterally.  Neurological examination:  Orientation: The patient is alert and oriented x3.  He asks appropriate questions (remembers examiner's daughters and asks about them). Cranial nerves: There is good facial symmetry with facial hypomimia. The speech is fluent and clear but hypophonic. Soft palate rises symmetrically and there is no tongue deviation. Hearing  is intact to conversational tone. Sensation: Sensation is intact to light touch throughout Motor: Strength is at least antigravity x4.  Movement examination: Tone: There is nl tone in the UE/LE Abnormal movements: there is no tremor today, but he does have right arm/shoulder dyskinesia Coordination:  There is no decremation with RAM's Gait and Station: pt is short stepped and shuffling without the walker.  He is better with the walker.  I have reviewed and interpreted the following labs independently    Chemistry      Component Value Date/Time   NA 136 12/17/2021 1529   K 4.7 12/17/2021 1529   CL 97 12/17/2021 1529   CO2 23 12/17/2021 1529   BUN 8 12/17/2021 1529   CREATININE 0.58 (L) 12/17/2021 1529   CREATININE 0.93 12/07/2019 1520      Component Value Date/Time   CALCIUM 8.8 12/17/2021 1529   ALKPHOS 75 12/17/2021 1529   AST 20 12/17/2021 1529  ALT 11 12/17/2021 1529   BILITOT 0.4 12/17/2021 1529       Lab Results  Component Value Date   WBC 7.4 12/17/2021   HGB 10.6 (L) 12/17/2021   HCT 30.9 (L) 12/17/2021   MCV 97 12/17/2021   PLT 228 12/17/2021    Lab Results  Component Value Date   TSH 3.879 11/21/2020     Total time spent on today's visit was 30 minutes, including both face-to-face time and nonface-to-face time.  Time included that spent on review of records (prior notes available to me/labs/imaging if pertinent), discussing treatment and goals, answering patient's questions and coordinating care.  Cc:  Javier Glazier, MD

## 2021-12-27 DIAGNOSIS — R1312 Dysphagia, oropharyngeal phase: Secondary | ICD-10-CM | POA: Diagnosis not present

## 2021-12-27 DIAGNOSIS — G20B2 Parkinson's disease with dyskinesia, with fluctuations: Secondary | ICD-10-CM | POA: Diagnosis not present

## 2021-12-27 DIAGNOSIS — I480 Paroxysmal atrial fibrillation: Secondary | ICD-10-CM | POA: Diagnosis not present

## 2021-12-27 DIAGNOSIS — M6281 Muscle weakness (generalized): Secondary | ICD-10-CM | POA: Diagnosis not present

## 2021-12-27 DIAGNOSIS — N390 Urinary tract infection, site not specified: Secondary | ICD-10-CM | POA: Diagnosis not present

## 2021-12-27 DIAGNOSIS — R2689 Other abnormalities of gait and mobility: Secondary | ICD-10-CM | POA: Diagnosis not present

## 2021-12-27 NOTE — Progress Notes (Signed)
Remote pacemaker transmission.   

## 2021-12-28 ENCOUNTER — Ambulatory Visit: Payer: Medicare Other | Admitting: Neurology

## 2021-12-31 DIAGNOSIS — M6281 Muscle weakness (generalized): Secondary | ICD-10-CM | POA: Diagnosis not present

## 2021-12-31 DIAGNOSIS — R2689 Other abnormalities of gait and mobility: Secondary | ICD-10-CM | POA: Diagnosis not present

## 2021-12-31 DIAGNOSIS — N390 Urinary tract infection, site not specified: Secondary | ICD-10-CM | POA: Diagnosis not present

## 2021-12-31 DIAGNOSIS — R1312 Dysphagia, oropharyngeal phase: Secondary | ICD-10-CM | POA: Diagnosis not present

## 2021-12-31 DIAGNOSIS — I480 Paroxysmal atrial fibrillation: Secondary | ICD-10-CM | POA: Diagnosis not present

## 2021-12-31 DIAGNOSIS — G20B2 Parkinson's disease with dyskinesia, with fluctuations: Secondary | ICD-10-CM | POA: Diagnosis not present

## 2022-01-01 DIAGNOSIS — R2689 Other abnormalities of gait and mobility: Secondary | ICD-10-CM | POA: Diagnosis not present

## 2022-01-01 DIAGNOSIS — G20B2 Parkinson's disease with dyskinesia, with fluctuations: Secondary | ICD-10-CM | POA: Diagnosis not present

## 2022-01-01 DIAGNOSIS — M6281 Muscle weakness (generalized): Secondary | ICD-10-CM | POA: Diagnosis not present

## 2022-01-01 DIAGNOSIS — I480 Paroxysmal atrial fibrillation: Secondary | ICD-10-CM | POA: Diagnosis not present

## 2022-01-01 DIAGNOSIS — N390 Urinary tract infection, site not specified: Secondary | ICD-10-CM | POA: Diagnosis not present

## 2022-01-01 DIAGNOSIS — R1312 Dysphagia, oropharyngeal phase: Secondary | ICD-10-CM | POA: Diagnosis not present

## 2022-01-02 DIAGNOSIS — R7309 Other abnormal glucose: Secondary | ICD-10-CM | POA: Diagnosis not present

## 2022-01-02 DIAGNOSIS — R946 Abnormal results of thyroid function studies: Secondary | ICD-10-CM | POA: Diagnosis not present

## 2022-01-02 DIAGNOSIS — I10 Essential (primary) hypertension: Secondary | ICD-10-CM | POA: Diagnosis not present

## 2022-01-03 DIAGNOSIS — G20B2 Parkinson's disease with dyskinesia, with fluctuations: Secondary | ICD-10-CM | POA: Diagnosis not present

## 2022-01-03 DIAGNOSIS — N390 Urinary tract infection, site not specified: Secondary | ICD-10-CM | POA: Diagnosis not present

## 2022-01-03 DIAGNOSIS — I480 Paroxysmal atrial fibrillation: Secondary | ICD-10-CM | POA: Diagnosis not present

## 2022-01-03 DIAGNOSIS — R2689 Other abnormalities of gait and mobility: Secondary | ICD-10-CM | POA: Diagnosis not present

## 2022-01-03 DIAGNOSIS — M6281 Muscle weakness (generalized): Secondary | ICD-10-CM | POA: Diagnosis not present

## 2022-01-03 DIAGNOSIS — R1312 Dysphagia, oropharyngeal phase: Secondary | ICD-10-CM | POA: Diagnosis not present

## 2022-01-04 DIAGNOSIS — M6281 Muscle weakness (generalized): Secondary | ICD-10-CM | POA: Diagnosis not present

## 2022-01-04 DIAGNOSIS — R2689 Other abnormalities of gait and mobility: Secondary | ICD-10-CM | POA: Diagnosis not present

## 2022-01-04 DIAGNOSIS — R1312 Dysphagia, oropharyngeal phase: Secondary | ICD-10-CM | POA: Diagnosis not present

## 2022-01-04 DIAGNOSIS — I480 Paroxysmal atrial fibrillation: Secondary | ICD-10-CM | POA: Diagnosis not present

## 2022-01-04 DIAGNOSIS — G20B2 Parkinson's disease with dyskinesia, with fluctuations: Secondary | ICD-10-CM | POA: Diagnosis not present

## 2022-01-04 DIAGNOSIS — N390 Urinary tract infection, site not specified: Secondary | ICD-10-CM | POA: Diagnosis not present

## 2022-01-07 DIAGNOSIS — N390 Urinary tract infection, site not specified: Secondary | ICD-10-CM | POA: Diagnosis not present

## 2022-01-07 DIAGNOSIS — R2689 Other abnormalities of gait and mobility: Secondary | ICD-10-CM | POA: Diagnosis not present

## 2022-01-07 DIAGNOSIS — I480 Paroxysmal atrial fibrillation: Secondary | ICD-10-CM | POA: Diagnosis not present

## 2022-01-07 DIAGNOSIS — R1312 Dysphagia, oropharyngeal phase: Secondary | ICD-10-CM | POA: Diagnosis not present

## 2022-01-07 DIAGNOSIS — M6281 Muscle weakness (generalized): Secondary | ICD-10-CM | POA: Diagnosis not present

## 2022-01-07 DIAGNOSIS — G20B2 Parkinson's disease with dyskinesia, with fluctuations: Secondary | ICD-10-CM | POA: Diagnosis not present

## 2022-01-08 DIAGNOSIS — I10 Essential (primary) hypertension: Secondary | ICD-10-CM | POA: Diagnosis not present

## 2022-01-08 DIAGNOSIS — I480 Paroxysmal atrial fibrillation: Secondary | ICD-10-CM | POA: Diagnosis not present

## 2022-01-08 DIAGNOSIS — R2689 Other abnormalities of gait and mobility: Secondary | ICD-10-CM | POA: Diagnosis not present

## 2022-01-08 DIAGNOSIS — M6281 Muscle weakness (generalized): Secondary | ICD-10-CM | POA: Diagnosis not present

## 2022-01-08 DIAGNOSIS — G20B2 Parkinson's disease with dyskinesia, with fluctuations: Secondary | ICD-10-CM | POA: Diagnosis not present

## 2022-01-08 DIAGNOSIS — R1312 Dysphagia, oropharyngeal phase: Secondary | ICD-10-CM | POA: Diagnosis not present

## 2022-01-08 DIAGNOSIS — N390 Urinary tract infection, site not specified: Secondary | ICD-10-CM | POA: Diagnosis not present

## 2022-01-09 DIAGNOSIS — G20B2 Parkinson's disease with dyskinesia, with fluctuations: Secondary | ICD-10-CM | POA: Diagnosis not present

## 2022-01-09 DIAGNOSIS — R1312 Dysphagia, oropharyngeal phase: Secondary | ICD-10-CM | POA: Diagnosis not present

## 2022-01-09 DIAGNOSIS — R2689 Other abnormalities of gait and mobility: Secondary | ICD-10-CM | POA: Diagnosis not present

## 2022-01-09 DIAGNOSIS — I480 Paroxysmal atrial fibrillation: Secondary | ICD-10-CM | POA: Diagnosis not present

## 2022-01-09 DIAGNOSIS — M6281 Muscle weakness (generalized): Secondary | ICD-10-CM | POA: Diagnosis not present

## 2022-01-09 DIAGNOSIS — N390 Urinary tract infection, site not specified: Secondary | ICD-10-CM | POA: Diagnosis not present

## 2022-01-10 DIAGNOSIS — I480 Paroxysmal atrial fibrillation: Secondary | ICD-10-CM | POA: Diagnosis not present

## 2022-01-10 DIAGNOSIS — M6281 Muscle weakness (generalized): Secondary | ICD-10-CM | POA: Diagnosis not present

## 2022-01-10 DIAGNOSIS — G20B2 Parkinson's disease with dyskinesia, with fluctuations: Secondary | ICD-10-CM | POA: Diagnosis not present

## 2022-01-10 DIAGNOSIS — R2689 Other abnormalities of gait and mobility: Secondary | ICD-10-CM | POA: Diagnosis not present

## 2022-01-10 DIAGNOSIS — R1312 Dysphagia, oropharyngeal phase: Secondary | ICD-10-CM | POA: Diagnosis not present

## 2022-01-10 DIAGNOSIS — N390 Urinary tract infection, site not specified: Secondary | ICD-10-CM | POA: Diagnosis not present

## 2022-01-14 DIAGNOSIS — R1312 Dysphagia, oropharyngeal phase: Secondary | ICD-10-CM | POA: Diagnosis not present

## 2022-01-14 DIAGNOSIS — G20B2 Parkinson's disease with dyskinesia, with fluctuations: Secondary | ICD-10-CM | POA: Diagnosis not present

## 2022-01-14 DIAGNOSIS — N390 Urinary tract infection, site not specified: Secondary | ICD-10-CM | POA: Diagnosis not present

## 2022-01-14 DIAGNOSIS — M6281 Muscle weakness (generalized): Secondary | ICD-10-CM | POA: Diagnosis not present

## 2022-01-14 DIAGNOSIS — I480 Paroxysmal atrial fibrillation: Secondary | ICD-10-CM | POA: Diagnosis not present

## 2022-01-14 DIAGNOSIS — R2689 Other abnormalities of gait and mobility: Secondary | ICD-10-CM | POA: Diagnosis not present

## 2022-01-15 DIAGNOSIS — I480 Paroxysmal atrial fibrillation: Secondary | ICD-10-CM | POA: Diagnosis not present

## 2022-01-15 DIAGNOSIS — N390 Urinary tract infection, site not specified: Secondary | ICD-10-CM | POA: Diagnosis not present

## 2022-01-15 DIAGNOSIS — R1310 Dysphagia, unspecified: Secondary | ICD-10-CM | POA: Diagnosis not present

## 2022-01-15 DIAGNOSIS — M6281 Muscle weakness (generalized): Secondary | ICD-10-CM | POA: Diagnosis not present

## 2022-01-15 DIAGNOSIS — G20B2 Parkinson's disease with dyskinesia, with fluctuations: Secondary | ICD-10-CM | POA: Diagnosis not present

## 2022-01-15 DIAGNOSIS — R1312 Dysphagia, oropharyngeal phase: Secondary | ICD-10-CM | POA: Diagnosis not present

## 2022-01-15 DIAGNOSIS — R2689 Other abnormalities of gait and mobility: Secondary | ICD-10-CM | POA: Diagnosis not present

## 2022-01-16 DIAGNOSIS — G20B2 Parkinson's disease with dyskinesia, with fluctuations: Secondary | ICD-10-CM | POA: Diagnosis not present

## 2022-01-16 DIAGNOSIS — M6281 Muscle weakness (generalized): Secondary | ICD-10-CM | POA: Diagnosis not present

## 2022-01-16 DIAGNOSIS — N390 Urinary tract infection, site not specified: Secondary | ICD-10-CM | POA: Diagnosis not present

## 2022-01-16 DIAGNOSIS — R2689 Other abnormalities of gait and mobility: Secondary | ICD-10-CM | POA: Diagnosis not present

## 2022-01-16 DIAGNOSIS — I480 Paroxysmal atrial fibrillation: Secondary | ICD-10-CM | POA: Diagnosis not present

## 2022-01-16 DIAGNOSIS — R1312 Dysphagia, oropharyngeal phase: Secondary | ICD-10-CM | POA: Diagnosis not present

## 2022-01-17 DIAGNOSIS — R1312 Dysphagia, oropharyngeal phase: Secondary | ICD-10-CM | POA: Diagnosis not present

## 2022-01-17 DIAGNOSIS — N390 Urinary tract infection, site not specified: Secondary | ICD-10-CM | POA: Diagnosis not present

## 2022-01-17 DIAGNOSIS — G20B2 Parkinson's disease with dyskinesia, with fluctuations: Secondary | ICD-10-CM | POA: Diagnosis not present

## 2022-01-17 DIAGNOSIS — M6281 Muscle weakness (generalized): Secondary | ICD-10-CM | POA: Diagnosis not present

## 2022-01-17 DIAGNOSIS — R2689 Other abnormalities of gait and mobility: Secondary | ICD-10-CM | POA: Diagnosis not present

## 2022-01-17 DIAGNOSIS — I480 Paroxysmal atrial fibrillation: Secondary | ICD-10-CM | POA: Diagnosis not present

## 2022-01-18 DIAGNOSIS — N390 Urinary tract infection, site not specified: Secondary | ICD-10-CM | POA: Diagnosis not present

## 2022-01-18 DIAGNOSIS — R2689 Other abnormalities of gait and mobility: Secondary | ICD-10-CM | POA: Diagnosis not present

## 2022-01-18 DIAGNOSIS — M6281 Muscle weakness (generalized): Secondary | ICD-10-CM | POA: Diagnosis not present

## 2022-01-18 DIAGNOSIS — R1312 Dysphagia, oropharyngeal phase: Secondary | ICD-10-CM | POA: Diagnosis not present

## 2022-01-18 DIAGNOSIS — G20B2 Parkinson's disease with dyskinesia, with fluctuations: Secondary | ICD-10-CM | POA: Diagnosis not present

## 2022-01-18 DIAGNOSIS — I480 Paroxysmal atrial fibrillation: Secondary | ICD-10-CM | POA: Diagnosis not present

## 2022-01-21 ENCOUNTER — Encounter: Payer: Self-pay | Admitting: Neurology

## 2022-01-21 DIAGNOSIS — R296 Repeated falls: Secondary | ICD-10-CM | POA: Diagnosis not present

## 2022-01-21 DIAGNOSIS — M6281 Muscle weakness (generalized): Secondary | ICD-10-CM | POA: Diagnosis not present

## 2022-01-21 DIAGNOSIS — E785 Hyperlipidemia, unspecified: Secondary | ICD-10-CM | POA: Diagnosis not present

## 2022-01-21 DIAGNOSIS — F419 Anxiety disorder, unspecified: Secondary | ICD-10-CM | POA: Diagnosis not present

## 2022-01-21 DIAGNOSIS — F32A Depression, unspecified: Secondary | ICD-10-CM | POA: Diagnosis not present

## 2022-01-21 DIAGNOSIS — E119 Type 2 diabetes mellitus without complications: Secondary | ICD-10-CM | POA: Diagnosis not present

## 2022-01-21 DIAGNOSIS — G20B1 Parkinson's disease with dyskinesia, without mention of fluctuations: Secondary | ICD-10-CM | POA: Diagnosis not present

## 2022-01-21 DIAGNOSIS — G20B2 Parkinson's disease with dyskinesia, with fluctuations: Secondary | ICD-10-CM | POA: Diagnosis not present

## 2022-01-21 DIAGNOSIS — I480 Paroxysmal atrial fibrillation: Secondary | ICD-10-CM | POA: Diagnosis not present

## 2022-01-21 DIAGNOSIS — R2689 Other abnormalities of gait and mobility: Secondary | ICD-10-CM | POA: Diagnosis not present

## 2022-01-21 DIAGNOSIS — R1312 Dysphagia, oropharyngeal phase: Secondary | ICD-10-CM | POA: Diagnosis not present

## 2022-01-21 DIAGNOSIS — E039 Hypothyroidism, unspecified: Secondary | ICD-10-CM | POA: Diagnosis not present

## 2022-01-21 DIAGNOSIS — N390 Urinary tract infection, site not specified: Secondary | ICD-10-CM | POA: Diagnosis not present

## 2022-01-22 DIAGNOSIS — G20B2 Parkinson's disease with dyskinesia, with fluctuations: Secondary | ICD-10-CM | POA: Diagnosis not present

## 2022-01-22 DIAGNOSIS — M6281 Muscle weakness (generalized): Secondary | ICD-10-CM | POA: Diagnosis not present

## 2022-01-22 DIAGNOSIS — R2689 Other abnormalities of gait and mobility: Secondary | ICD-10-CM | POA: Diagnosis not present

## 2022-01-22 DIAGNOSIS — I480 Paroxysmal atrial fibrillation: Secondary | ICD-10-CM | POA: Diagnosis not present

## 2022-01-22 DIAGNOSIS — R1312 Dysphagia, oropharyngeal phase: Secondary | ICD-10-CM | POA: Diagnosis not present

## 2022-01-22 DIAGNOSIS — N390 Urinary tract infection, site not specified: Secondary | ICD-10-CM | POA: Diagnosis not present

## 2022-01-23 DIAGNOSIS — N390 Urinary tract infection, site not specified: Secondary | ICD-10-CM | POA: Diagnosis not present

## 2022-01-23 DIAGNOSIS — I480 Paroxysmal atrial fibrillation: Secondary | ICD-10-CM | POA: Diagnosis not present

## 2022-01-23 DIAGNOSIS — R2681 Unsteadiness on feet: Secondary | ICD-10-CM | POA: Diagnosis not present

## 2022-01-23 DIAGNOSIS — R2689 Other abnormalities of gait and mobility: Secondary | ICD-10-CM | POA: Diagnosis not present

## 2022-01-23 DIAGNOSIS — M6281 Muscle weakness (generalized): Secondary | ICD-10-CM | POA: Diagnosis not present

## 2022-01-23 DIAGNOSIS — G20B2 Parkinson's disease with dyskinesia, with fluctuations: Secondary | ICD-10-CM | POA: Diagnosis not present

## 2022-01-24 DIAGNOSIS — G20B2 Parkinson's disease with dyskinesia, with fluctuations: Secondary | ICD-10-CM | POA: Diagnosis not present

## 2022-01-24 DIAGNOSIS — N390 Urinary tract infection, site not specified: Secondary | ICD-10-CM | POA: Diagnosis not present

## 2022-01-24 DIAGNOSIS — M6281 Muscle weakness (generalized): Secondary | ICD-10-CM | POA: Diagnosis not present

## 2022-01-24 DIAGNOSIS — R2689 Other abnormalities of gait and mobility: Secondary | ICD-10-CM | POA: Diagnosis not present

## 2022-01-24 DIAGNOSIS — I480 Paroxysmal atrial fibrillation: Secondary | ICD-10-CM | POA: Diagnosis not present

## 2022-01-24 DIAGNOSIS — R2681 Unsteadiness on feet: Secondary | ICD-10-CM | POA: Diagnosis not present

## 2022-01-28 DIAGNOSIS — I480 Paroxysmal atrial fibrillation: Secondary | ICD-10-CM | POA: Diagnosis not present

## 2022-01-28 DIAGNOSIS — R2681 Unsteadiness on feet: Secondary | ICD-10-CM | POA: Diagnosis not present

## 2022-01-28 DIAGNOSIS — R2689 Other abnormalities of gait and mobility: Secondary | ICD-10-CM | POA: Diagnosis not present

## 2022-01-28 DIAGNOSIS — N390 Urinary tract infection, site not specified: Secondary | ICD-10-CM | POA: Diagnosis not present

## 2022-01-28 DIAGNOSIS — M6281 Muscle weakness (generalized): Secondary | ICD-10-CM | POA: Diagnosis not present

## 2022-01-28 DIAGNOSIS — G20B2 Parkinson's disease with dyskinesia, with fluctuations: Secondary | ICD-10-CM | POA: Diagnosis not present

## 2022-01-29 DIAGNOSIS — M6281 Muscle weakness (generalized): Secondary | ICD-10-CM | POA: Diagnosis not present

## 2022-01-29 DIAGNOSIS — R2689 Other abnormalities of gait and mobility: Secondary | ICD-10-CM | POA: Diagnosis not present

## 2022-01-29 DIAGNOSIS — N390 Urinary tract infection, site not specified: Secondary | ICD-10-CM | POA: Diagnosis not present

## 2022-01-29 DIAGNOSIS — R2681 Unsteadiness on feet: Secondary | ICD-10-CM | POA: Diagnosis not present

## 2022-01-29 DIAGNOSIS — G20B2 Parkinson's disease with dyskinesia, with fluctuations: Secondary | ICD-10-CM | POA: Diagnosis not present

## 2022-01-29 DIAGNOSIS — I480 Paroxysmal atrial fibrillation: Secondary | ICD-10-CM | POA: Diagnosis not present

## 2022-01-30 DIAGNOSIS — R2689 Other abnormalities of gait and mobility: Secondary | ICD-10-CM | POA: Diagnosis not present

## 2022-01-30 DIAGNOSIS — N390 Urinary tract infection, site not specified: Secondary | ICD-10-CM | POA: Diagnosis not present

## 2022-01-30 DIAGNOSIS — I480 Paroxysmal atrial fibrillation: Secondary | ICD-10-CM | POA: Diagnosis not present

## 2022-01-30 DIAGNOSIS — R2681 Unsteadiness on feet: Secondary | ICD-10-CM | POA: Diagnosis not present

## 2022-01-30 DIAGNOSIS — G20B2 Parkinson's disease with dyskinesia, with fluctuations: Secondary | ICD-10-CM | POA: Diagnosis not present

## 2022-01-30 DIAGNOSIS — M6281 Muscle weakness (generalized): Secondary | ICD-10-CM | POA: Diagnosis not present

## 2022-02-01 DIAGNOSIS — N390 Urinary tract infection, site not specified: Secondary | ICD-10-CM | POA: Diagnosis not present

## 2022-02-01 DIAGNOSIS — M6281 Muscle weakness (generalized): Secondary | ICD-10-CM | POA: Diagnosis not present

## 2022-02-01 DIAGNOSIS — R2689 Other abnormalities of gait and mobility: Secondary | ICD-10-CM | POA: Diagnosis not present

## 2022-02-01 DIAGNOSIS — G20B2 Parkinson's disease with dyskinesia, with fluctuations: Secondary | ICD-10-CM | POA: Diagnosis not present

## 2022-02-01 DIAGNOSIS — I480 Paroxysmal atrial fibrillation: Secondary | ICD-10-CM | POA: Diagnosis not present

## 2022-02-01 DIAGNOSIS — R2681 Unsteadiness on feet: Secondary | ICD-10-CM | POA: Diagnosis not present

## 2022-02-04 DIAGNOSIS — N390 Urinary tract infection, site not specified: Secondary | ICD-10-CM | POA: Diagnosis not present

## 2022-02-04 DIAGNOSIS — R2689 Other abnormalities of gait and mobility: Secondary | ICD-10-CM | POA: Diagnosis not present

## 2022-02-04 DIAGNOSIS — R2681 Unsteadiness on feet: Secondary | ICD-10-CM | POA: Diagnosis not present

## 2022-02-04 DIAGNOSIS — M6281 Muscle weakness (generalized): Secondary | ICD-10-CM | POA: Diagnosis not present

## 2022-02-04 DIAGNOSIS — G20B2 Parkinson's disease with dyskinesia, with fluctuations: Secondary | ICD-10-CM | POA: Diagnosis not present

## 2022-02-04 DIAGNOSIS — I480 Paroxysmal atrial fibrillation: Secondary | ICD-10-CM | POA: Diagnosis not present

## 2022-02-05 DIAGNOSIS — N390 Urinary tract infection, site not specified: Secondary | ICD-10-CM | POA: Diagnosis not present

## 2022-02-05 DIAGNOSIS — R2689 Other abnormalities of gait and mobility: Secondary | ICD-10-CM | POA: Diagnosis not present

## 2022-02-05 DIAGNOSIS — M6281 Muscle weakness (generalized): Secondary | ICD-10-CM | POA: Diagnosis not present

## 2022-02-05 DIAGNOSIS — I480 Paroxysmal atrial fibrillation: Secondary | ICD-10-CM | POA: Diagnosis not present

## 2022-02-05 DIAGNOSIS — R2681 Unsteadiness on feet: Secondary | ICD-10-CM | POA: Diagnosis not present

## 2022-02-05 DIAGNOSIS — G20B2 Parkinson's disease with dyskinesia, with fluctuations: Secondary | ICD-10-CM | POA: Diagnosis not present

## 2022-02-07 DIAGNOSIS — M6281 Muscle weakness (generalized): Secondary | ICD-10-CM | POA: Diagnosis not present

## 2022-02-07 DIAGNOSIS — G20B2 Parkinson's disease with dyskinesia, with fluctuations: Secondary | ICD-10-CM | POA: Diagnosis not present

## 2022-02-07 DIAGNOSIS — I480 Paroxysmal atrial fibrillation: Secondary | ICD-10-CM | POA: Diagnosis not present

## 2022-02-07 DIAGNOSIS — R2689 Other abnormalities of gait and mobility: Secondary | ICD-10-CM | POA: Diagnosis not present

## 2022-02-07 DIAGNOSIS — R2681 Unsteadiness on feet: Secondary | ICD-10-CM | POA: Diagnosis not present

## 2022-02-07 DIAGNOSIS — N390 Urinary tract infection, site not specified: Secondary | ICD-10-CM | POA: Diagnosis not present

## 2022-02-11 DIAGNOSIS — N390 Urinary tract infection, site not specified: Secondary | ICD-10-CM | POA: Diagnosis not present

## 2022-02-11 DIAGNOSIS — R2681 Unsteadiness on feet: Secondary | ICD-10-CM | POA: Diagnosis not present

## 2022-02-11 DIAGNOSIS — I480 Paroxysmal atrial fibrillation: Secondary | ICD-10-CM | POA: Diagnosis not present

## 2022-02-11 DIAGNOSIS — G20B2 Parkinson's disease with dyskinesia, with fluctuations: Secondary | ICD-10-CM | POA: Diagnosis not present

## 2022-02-11 DIAGNOSIS — R2689 Other abnormalities of gait and mobility: Secondary | ICD-10-CM | POA: Diagnosis not present

## 2022-02-11 DIAGNOSIS — M6281 Muscle weakness (generalized): Secondary | ICD-10-CM | POA: Diagnosis not present

## 2022-02-12 DIAGNOSIS — G20B2 Parkinson's disease with dyskinesia, with fluctuations: Secondary | ICD-10-CM | POA: Diagnosis not present

## 2022-02-12 DIAGNOSIS — M6281 Muscle weakness (generalized): Secondary | ICD-10-CM | POA: Diagnosis not present

## 2022-02-12 DIAGNOSIS — R2681 Unsteadiness on feet: Secondary | ICD-10-CM | POA: Diagnosis not present

## 2022-02-12 DIAGNOSIS — R2689 Other abnormalities of gait and mobility: Secondary | ICD-10-CM | POA: Diagnosis not present

## 2022-02-12 DIAGNOSIS — I480 Paroxysmal atrial fibrillation: Secondary | ICD-10-CM | POA: Diagnosis not present

## 2022-02-12 DIAGNOSIS — N390 Urinary tract infection, site not specified: Secondary | ICD-10-CM | POA: Diagnosis not present

## 2022-02-13 DIAGNOSIS — G20B2 Parkinson's disease with dyskinesia, with fluctuations: Secondary | ICD-10-CM | POA: Diagnosis not present

## 2022-02-13 DIAGNOSIS — N390 Urinary tract infection, site not specified: Secondary | ICD-10-CM | POA: Diagnosis not present

## 2022-02-13 DIAGNOSIS — R2681 Unsteadiness on feet: Secondary | ICD-10-CM | POA: Diagnosis not present

## 2022-02-13 DIAGNOSIS — M6281 Muscle weakness (generalized): Secondary | ICD-10-CM | POA: Diagnosis not present

## 2022-02-13 DIAGNOSIS — R2689 Other abnormalities of gait and mobility: Secondary | ICD-10-CM | POA: Diagnosis not present

## 2022-02-13 DIAGNOSIS — I480 Paroxysmal atrial fibrillation: Secondary | ICD-10-CM | POA: Diagnosis not present

## 2022-02-19 DIAGNOSIS — R2681 Unsteadiness on feet: Secondary | ICD-10-CM | POA: Diagnosis not present

## 2022-02-19 DIAGNOSIS — G20B2 Parkinson's disease with dyskinesia, with fluctuations: Secondary | ICD-10-CM | POA: Diagnosis not present

## 2022-02-19 DIAGNOSIS — R2689 Other abnormalities of gait and mobility: Secondary | ICD-10-CM | POA: Diagnosis not present

## 2022-02-19 DIAGNOSIS — I480 Paroxysmal atrial fibrillation: Secondary | ICD-10-CM | POA: Diagnosis not present

## 2022-02-19 DIAGNOSIS — N390 Urinary tract infection, site not specified: Secondary | ICD-10-CM | POA: Diagnosis not present

## 2022-02-19 DIAGNOSIS — M6281 Muscle weakness (generalized): Secondary | ICD-10-CM | POA: Diagnosis not present

## 2022-02-22 DIAGNOSIS — G20B2 Parkinson's disease with dyskinesia, with fluctuations: Secondary | ICD-10-CM | POA: Diagnosis not present

## 2022-02-22 DIAGNOSIS — I480 Paroxysmal atrial fibrillation: Secondary | ICD-10-CM | POA: Diagnosis not present

## 2022-02-22 DIAGNOSIS — R2681 Unsteadiness on feet: Secondary | ICD-10-CM | POA: Diagnosis not present

## 2022-02-22 DIAGNOSIS — M6281 Muscle weakness (generalized): Secondary | ICD-10-CM | POA: Diagnosis not present

## 2022-02-25 DIAGNOSIS — I480 Paroxysmal atrial fibrillation: Secondary | ICD-10-CM | POA: Diagnosis not present

## 2022-02-25 DIAGNOSIS — M6281 Muscle weakness (generalized): Secondary | ICD-10-CM | POA: Diagnosis not present

## 2022-02-25 DIAGNOSIS — G20B2 Parkinson's disease with dyskinesia, with fluctuations: Secondary | ICD-10-CM | POA: Diagnosis not present

## 2022-02-25 DIAGNOSIS — R2681 Unsteadiness on feet: Secondary | ICD-10-CM | POA: Diagnosis not present

## 2022-02-28 DIAGNOSIS — I480 Paroxysmal atrial fibrillation: Secondary | ICD-10-CM | POA: Diagnosis not present

## 2022-02-28 DIAGNOSIS — G20B2 Parkinson's disease with dyskinesia, with fluctuations: Secondary | ICD-10-CM | POA: Diagnosis not present

## 2022-02-28 DIAGNOSIS — R2681 Unsteadiness on feet: Secondary | ICD-10-CM | POA: Diagnosis not present

## 2022-02-28 DIAGNOSIS — M6281 Muscle weakness (generalized): Secondary | ICD-10-CM | POA: Diagnosis not present

## 2022-03-01 DIAGNOSIS — R2681 Unsteadiness on feet: Secondary | ICD-10-CM | POA: Diagnosis not present

## 2022-03-01 DIAGNOSIS — I480 Paroxysmal atrial fibrillation: Secondary | ICD-10-CM | POA: Diagnosis not present

## 2022-03-01 DIAGNOSIS — M6281 Muscle weakness (generalized): Secondary | ICD-10-CM | POA: Diagnosis not present

## 2022-03-01 DIAGNOSIS — G20B2 Parkinson's disease with dyskinesia, with fluctuations: Secondary | ICD-10-CM | POA: Diagnosis not present

## 2022-03-01 IMAGING — CT CT HEAD W/O CM
4 series · 17 of 47 positions shown, 19 images · non-contrast
Comparison: Head CT dated 08/22/2007.

CLINICAL DATA: Delirium.

EXAM:
CT HEAD WITHOUT CONTRAST
TECHNIQUE: Contiguous axial images were obtained from the base of the skull
through the vertex without intravenous contrast.

[Series 3: head bone · axial · 0.43mm/px · z∈[-63,-3]mm · 4 of 87 slices shown]
[im 9/87  bone]
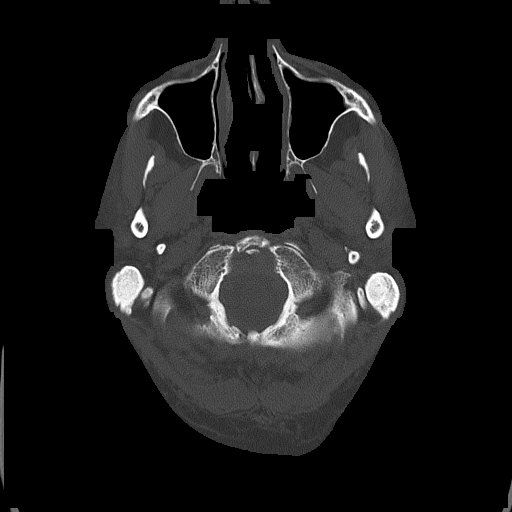
[im 18/87  bone]
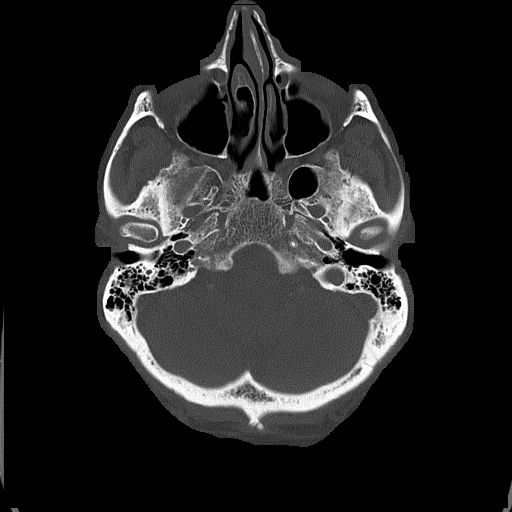
[im 26/87  bone]
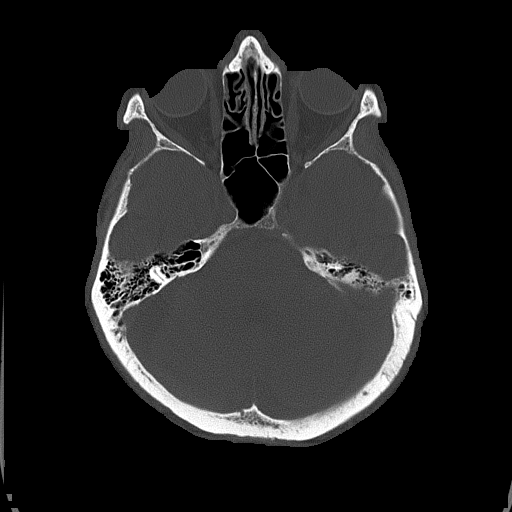
[im 39/87  bone]
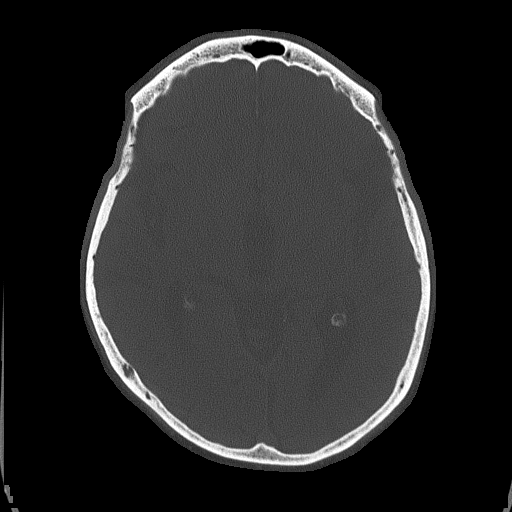

[Series 4: head without · axial · non-contrast · 0.43mm/px · z∈[-59,+66]mm · 7 of 35 slices shown, 9 images]
[im 5/35  brain]
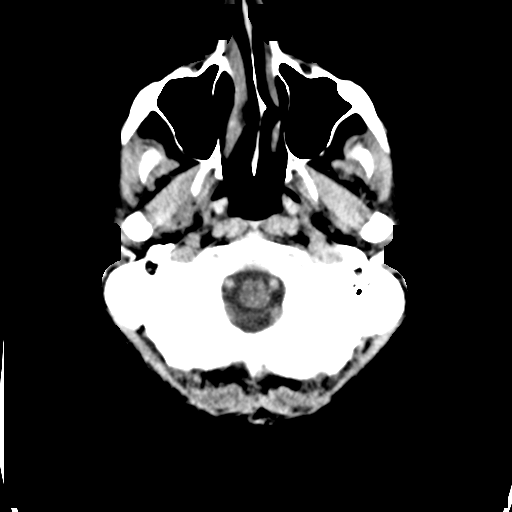
[im 5/35  bone]
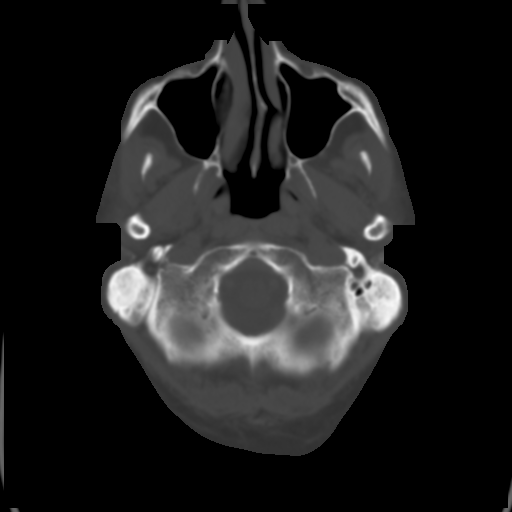
[im 9/35  brain]
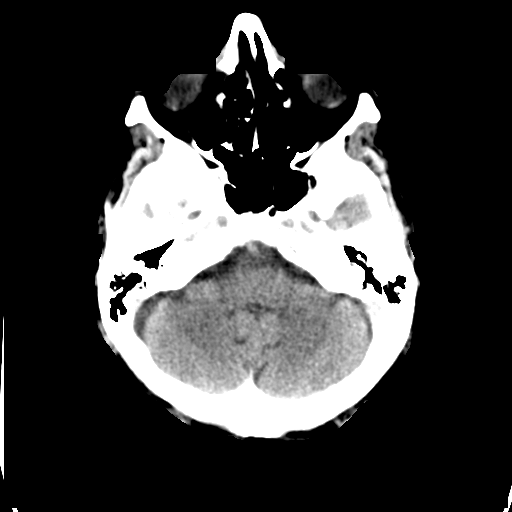
[im 13/35  brain]
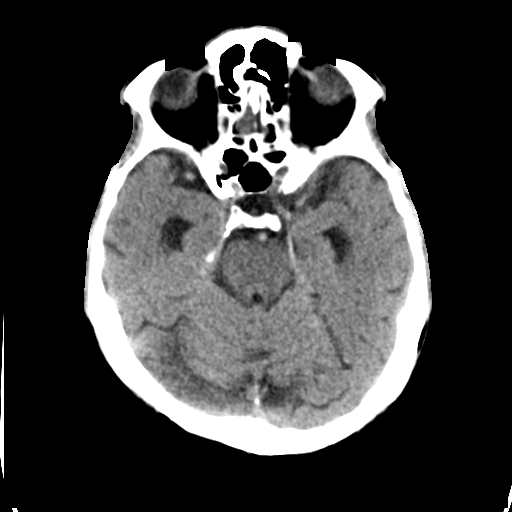
[im 18/35  brain]
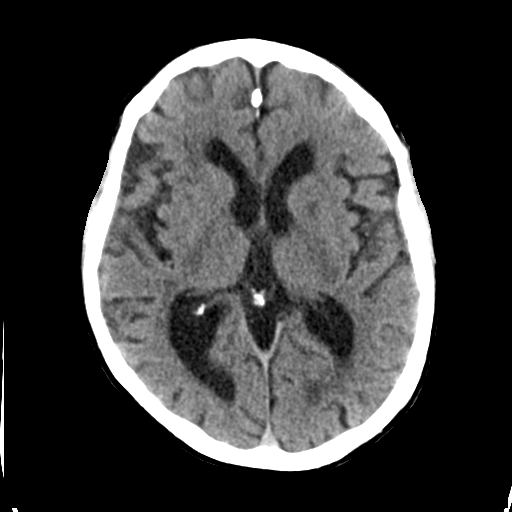
[im 22/35  brain]
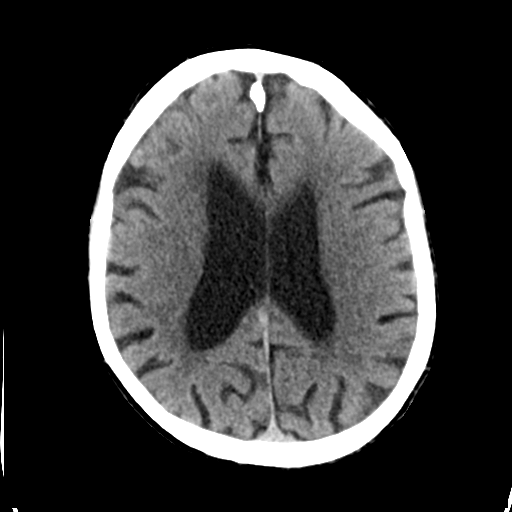
[im 22/35  bone]
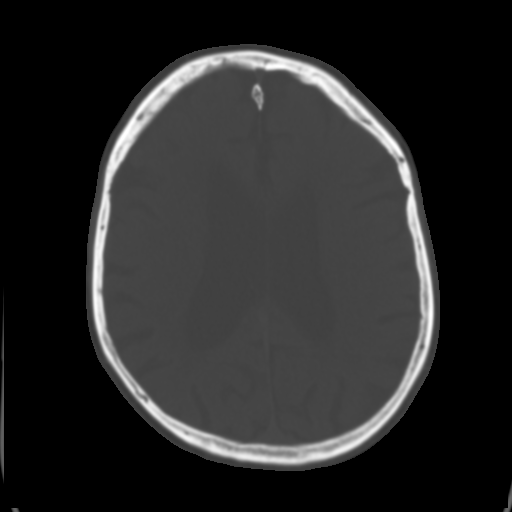
[im 26/35  brain]
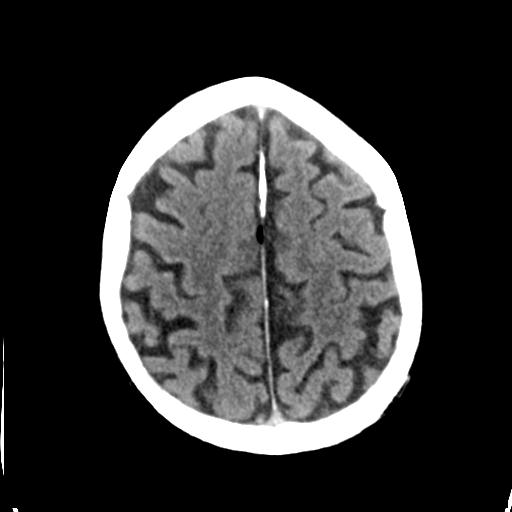
[im 30/35  brain]
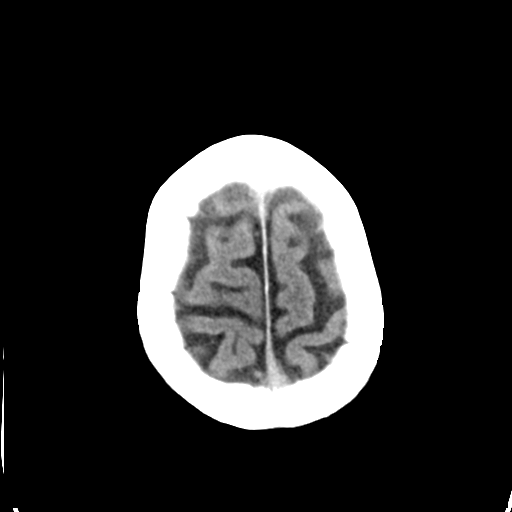

[Series 5: head without cor · coronal · non-contrast · 0.33mm/px · 3 of 72 slices shown]
[im 24/72  brain]
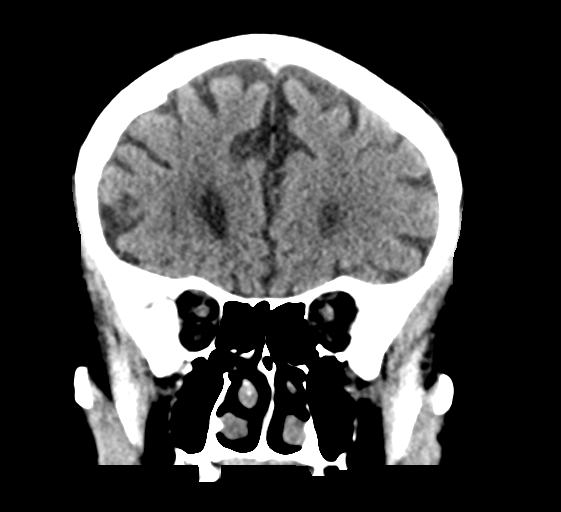
[im 32/72  brain]
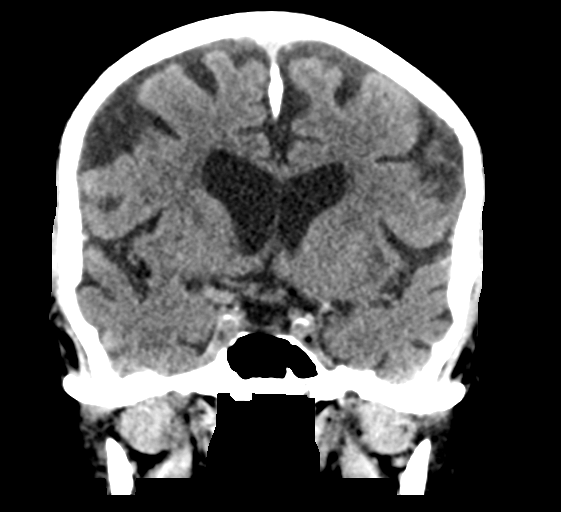
[im 40/72  brain]
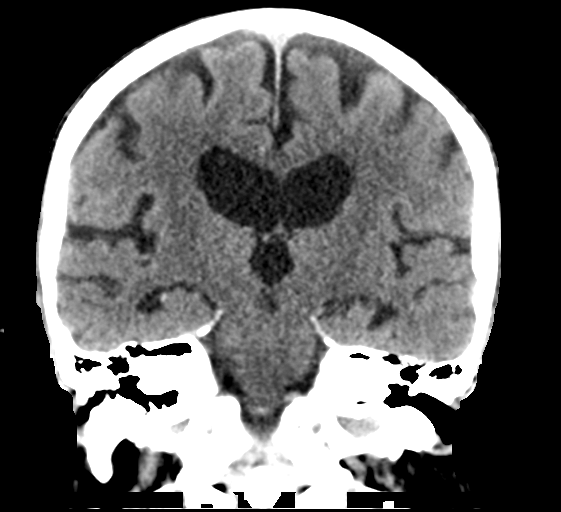

[Series 6: head without sag · sagittal · non-contrast · 0.36mm/px · 3 of 65 slices shown]
[im 22/65  brain]
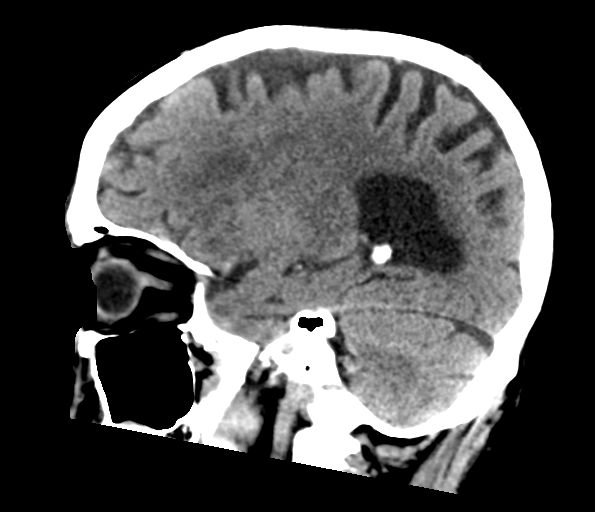
[im 33/65  brain]
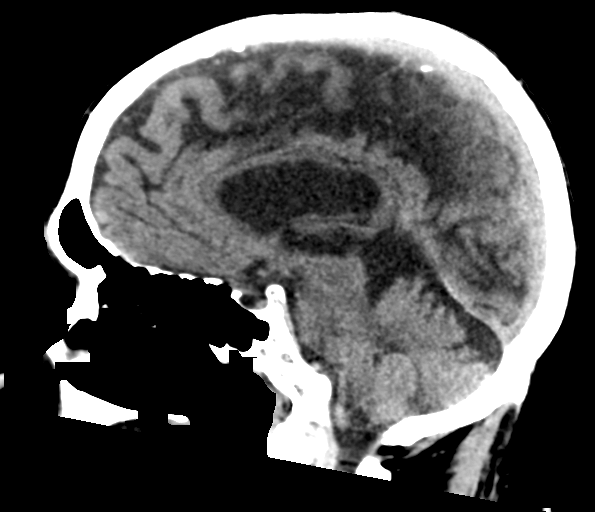
[im 43/65  brain]
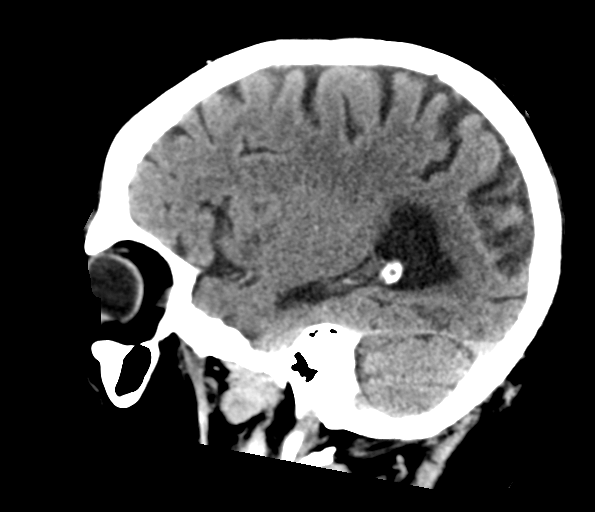

[17 of 47 positions shown; findings below may reference images not displayed]

FINDINGS: Brain: Mild age-related atrophy and chronic microvascular ischemic
changes. There is no acute intracranial hemorrhage. No mass effect
or midline shift. No extra-axial fluid collection.

Vascular: No hyperdense vessel or unexpected calcification.

Skull: Normal. Negative for fracture or focal lesion.

Sinuses/Orbits: No acute finding.

Other: None
IMPRESSION: 1. No acute intracranial pathology.
2. Mild age-related atrophy and chronic microvascular ischemic
changes.

## 2022-03-05 DIAGNOSIS — I480 Paroxysmal atrial fibrillation: Secondary | ICD-10-CM | POA: Diagnosis not present

## 2022-03-05 DIAGNOSIS — M6281 Muscle weakness (generalized): Secondary | ICD-10-CM | POA: Diagnosis not present

## 2022-03-05 DIAGNOSIS — G20B2 Parkinson's disease with dyskinesia, with fluctuations: Secondary | ICD-10-CM | POA: Diagnosis not present

## 2022-03-05 DIAGNOSIS — R2681 Unsteadiness on feet: Secondary | ICD-10-CM | POA: Diagnosis not present

## 2022-03-06 DIAGNOSIS — F411 Generalized anxiety disorder: Secondary | ICD-10-CM | POA: Diagnosis not present

## 2022-03-06 DIAGNOSIS — F5101 Primary insomnia: Secondary | ICD-10-CM | POA: Diagnosis not present

## 2022-03-06 DIAGNOSIS — I480 Paroxysmal atrial fibrillation: Secondary | ICD-10-CM | POA: Diagnosis not present

## 2022-03-06 DIAGNOSIS — F331 Major depressive disorder, recurrent, moderate: Secondary | ICD-10-CM | POA: Diagnosis not present

## 2022-03-06 DIAGNOSIS — M6281 Muscle weakness (generalized): Secondary | ICD-10-CM | POA: Diagnosis not present

## 2022-03-06 DIAGNOSIS — G20B2 Parkinson's disease with dyskinesia, with fluctuations: Secondary | ICD-10-CM | POA: Diagnosis not present

## 2022-03-06 DIAGNOSIS — R2681 Unsteadiness on feet: Secondary | ICD-10-CM | POA: Diagnosis not present

## 2022-03-20 ENCOUNTER — Ambulatory Visit (INDEPENDENT_AMBULATORY_CARE_PROVIDER_SITE_OTHER): Payer: Medicare Other

## 2022-03-20 DIAGNOSIS — I480 Paroxysmal atrial fibrillation: Secondary | ICD-10-CM | POA: Diagnosis not present

## 2022-03-20 DIAGNOSIS — F32A Depression, unspecified: Secondary | ICD-10-CM | POA: Diagnosis not present

## 2022-03-20 DIAGNOSIS — G20B1 Parkinson's disease with dyskinesia, without mention of fluctuations: Secondary | ICD-10-CM | POA: Diagnosis not present

## 2022-03-20 DIAGNOSIS — R296 Repeated falls: Secondary | ICD-10-CM | POA: Diagnosis not present

## 2022-03-20 DIAGNOSIS — F419 Anxiety disorder, unspecified: Secondary | ICD-10-CM | POA: Diagnosis not present

## 2022-03-20 DIAGNOSIS — I495 Sick sinus syndrome: Secondary | ICD-10-CM | POA: Diagnosis not present

## 2022-03-20 DIAGNOSIS — E039 Hypothyroidism, unspecified: Secondary | ICD-10-CM | POA: Diagnosis not present

## 2022-03-20 DIAGNOSIS — E119 Type 2 diabetes mellitus without complications: Secondary | ICD-10-CM | POA: Diagnosis not present

## 2022-03-21 LAB — CUP PACEART REMOTE DEVICE CHECK
Battery Remaining Longevity: 42 mo
Battery Remaining Percentage: 40 %
Brady Statistic RA Percent Paced: 26 %
Brady Statistic RV Percent Paced: 3 %
Date Time Interrogation Session: 20231227021100
Implantable Lead Connection Status: 753985
Implantable Lead Connection Status: 753985
Implantable Lead Implant Date: 20151013
Implantable Lead Implant Date: 20151013
Implantable Lead Location: 753859
Implantable Lead Location: 753860
Implantable Lead Model: 4135
Implantable Lead Model: 4136
Implantable Lead Serial Number: 29480373
Implantable Lead Serial Number: 29608302
Implantable Pulse Generator Implant Date: 20151013
Lead Channel Impedance Value: 502 Ohm
Lead Channel Impedance Value: 532 Ohm
Lead Channel Setting Pacing Amplitude: 2 V
Lead Channel Setting Pacing Amplitude: 4 V
Lead Channel Setting Pacing Pulse Width: 1 ms
Lead Channel Setting Sensing Sensitivity: 4 mV
Pulse Gen Serial Number: 702951
Zone Setting Status: 755011

## 2022-03-27 DIAGNOSIS — R296 Repeated falls: Secondary | ICD-10-CM | POA: Diagnosis not present

## 2022-03-27 DIAGNOSIS — F32A Depression, unspecified: Secondary | ICD-10-CM | POA: Diagnosis not present

## 2022-03-27 DIAGNOSIS — N400 Enlarged prostate without lower urinary tract symptoms: Secondary | ICD-10-CM | POA: Diagnosis not present

## 2022-03-27 DIAGNOSIS — E119 Type 2 diabetes mellitus without complications: Secondary | ICD-10-CM | POA: Diagnosis not present

## 2022-03-27 DIAGNOSIS — E039 Hypothyroidism, unspecified: Secondary | ICD-10-CM | POA: Diagnosis not present

## 2022-03-27 DIAGNOSIS — I480 Paroxysmal atrial fibrillation: Secondary | ICD-10-CM | POA: Diagnosis not present

## 2022-03-27 DIAGNOSIS — E785 Hyperlipidemia, unspecified: Secondary | ICD-10-CM | POA: Diagnosis not present

## 2022-03-27 DIAGNOSIS — F419 Anxiety disorder, unspecified: Secondary | ICD-10-CM | POA: Diagnosis not present

## 2022-03-27 DIAGNOSIS — G20B1 Parkinson's disease with dyskinesia, without mention of fluctuations: Secondary | ICD-10-CM | POA: Diagnosis not present

## 2022-03-27 DIAGNOSIS — Z7984 Long term (current) use of oral hypoglycemic drugs: Secondary | ICD-10-CM | POA: Diagnosis not present

## 2022-04-04 ENCOUNTER — Encounter: Payer: Self-pay | Admitting: Physician Assistant

## 2022-04-04 ENCOUNTER — Ambulatory Visit: Payer: Medicare Other | Attending: Physician Assistant | Admitting: Physician Assistant

## 2022-04-04 VITALS — BP 90/50 | HR 92 | Ht 69.0 in | Wt 136.6 lb

## 2022-04-04 DIAGNOSIS — I959 Hypotension, unspecified: Secondary | ICD-10-CM | POA: Diagnosis not present

## 2022-04-04 DIAGNOSIS — I480 Paroxysmal atrial fibrillation: Secondary | ICD-10-CM | POA: Insufficient documentation

## 2022-04-04 DIAGNOSIS — E119 Type 2 diabetes mellitus without complications: Secondary | ICD-10-CM | POA: Insufficient documentation

## 2022-04-04 DIAGNOSIS — R0989 Other specified symptoms and signs involving the circulatory and respiratory systems: Secondary | ICD-10-CM | POA: Diagnosis not present

## 2022-04-04 DIAGNOSIS — R058 Other specified cough: Secondary | ICD-10-CM | POA: Diagnosis not present

## 2022-04-04 DIAGNOSIS — Z950 Presence of cardiac pacemaker: Secondary | ICD-10-CM | POA: Diagnosis not present

## 2022-04-04 LAB — CBC

## 2022-04-04 MED ORDER — MIDODRINE HCL 2.5 MG PO TABS: ORAL_TABLET | ORAL | 1 refills | Status: DC

## 2022-04-04 NOTE — Progress Notes (Signed)
Cardiology Office Note:    Date:  04/06/2022   ID:  Stephen Robbins, DOB June 08, 1935, MRN [id number]  PCP:  Javier Glazier, MD   [facility] Cardiologist:  Sanda Klein, MD     Referring MD: Javier Glazier, MD   Chief Complaint  Patient presents with   Follow-up    Seen for Dr. Sallyanne Kuster    History of Present Illness:    Stephen Robbins is a 87 y.o. male with a hx of PAF on Xarelto detected on pacemaker interrogation, SSS s/p [employer] dual-chamber PPM 22-Jun-2013, chronotropic incompetence, Parkinson's disease, DM 2 and asymptomatic brief nonsustained VT detected on pacemaker.  Patient resides at [facility].  Unfortunately his wife passed away in June 23, 2018.  Patient was last seen by Dr. Sallyanne Kuster in November 2022, device interrogation at the time showed a very low A-fib burden of less than 1%.  Dr. Sallyanne Kuster was concerned that some of his falling episode may be related to orthostatic dizziness. Device interrogation in January 2023 showed only 3-minute episode of A-fib.   Patient was admitted from 9/14 until 12/09/2021 with weakness, confusion and lower abdominal pain.  Patient was found to have complicated urinary tract infection and was treated with antibiotic.  Echocardiogram obtained on 12/07/2021 showed EF 55 to 60%, no regional wall motion abnormality.  CT of abdomen and pelvis revealed tiny nonobstructive renal calculi, bladder thickening, several 6 mm groundglass opacity in the left lower lobe.  It was recommended for repeat CT imaging in 3 to 6 months to follow-up.  He was treated for sepsis with Rocephin.  AKI resolved.  Troponin was mildly elevated (29-->109-->245-->340) likely due to demand ischemia.   I last saw the patient on 12/17/2021 for posthospital follow-up.  According to his her caretaker, he was largely wheelchair-bound at that point.  He was able to get up and exercise with physical therapy however spent majority of the time in the wheelchair.  His  functional ability was extremely limited.  According to the patient, he moved out from his old apartment in February and everything was still in boxes including the remote transmitter, I discussed this with Dr. Sallyanne Kuster.  He was still very weak at the time and his voice was very soft.  Discharge paperwork from the hospital mention patient may benefit from palliative care follow-up at the skilled nursing facility.  I asked him to follow-up with his PCP to have blood work and has repeat CT to follow-up on the lung nodule.  Since the last visit, he has been able to get a remote transmitter, last device interrogation was 03/20/2022 which showed very low A-fib burden, 1 episode of atrial arrhythmia lasted 54 seconds.  Normal device function.  Patient presents today for follow-up.  He continues to have weakness and his functional ability has not changed.  His blood pressure on arrival was 99 systolic, however when I checked it myself it was actually 90/50.  He denies any significant dizziness.  Since last night, he has had productive cough with brownish discoloration.  I recommend a CBC, basic metabolic panel and also a chest x-ray.  Given hypotension, I decided to stop his Wilder Glade and add midodrine 2.5 mg twice a day as needed for systolic blood pressures lower than 100 mmHg.  I recommended 4 weeks follow-up for close monitoring.  Past Medical History:  Diagnosis Date   Arthritis    "joints; a little" (01/04/2014)   Asymptomatic bilateral carotid artery stenosis  per duplex  05-15-2012  left >39%/   right 40-59%   Basal cell carcinoma    nose   Borderline diabetes    BPH (benign prostatic hypertrophy) with urinary obstruction    Bradycardia    Bronchial pneumonia 1958   Chronotropic incompetence with sinus node dysfunction (HCC)    Compressed cervical disc    Compression of lumbar vertebra (HCC)    L4 -- L5   Dizzy    Dysmetabolic syndrome    Fatigue    Frequency of urination    GERD  (gastroesophageal reflux disease)    occasional   Hemorrhoids    Hyperlipidemia    Hypothyroidism    Nocturia    NSVT (nonsustained ventricular tachycardia) [facility])    cardiologist-  dr croitoru   Pacemaker    Urgency of urination    Wears glasses     Past Surgical History:  Procedure Laterality Date   BASAL CELL CARCINOMA EXCISION     nose   CLOSED REDUCTION NASAL FRACTURE  09-01-2007   CYSTOSCOPY N/A 12/28/2012   Procedure: CYSTOSCOPY;  Surgeon: Bernestine Amass, MD;  Location[facility];  Service: Urology;  Laterality: N/A;   EXERCISE TOLERENCE TEST  12-03-2012  DR CROITURO   CHRONOTROPIC INCOMPETENCE/ NORMAL RESTING BP W/ APPROPRIATE RESPONSE/ NO CHEST PAIN/ NO ST CHANGES FROM BASELINE   INSERT / REPLACE / REMOVE PACEMAKER  01/04/2014   [employer] model L121 serial number [id number]   LACERATION REPAIR Right 1978   middle finger   NEUROPLASTY / TRANSPOSITION ULNAR NERVE AT ELBOW Left 02-09-2010   PERMANENT PACEMAKER INSERTION N/A 01/04/2014   Procedure: PERMANENT PACEMAKER INSERTION;  Surgeon: Sanda Klein, MD;  Location: [facility] CATH LAB;  Service: Cardiovascular;  Laterality: N/A;   SKIN BIOPSY     scc   TONSILLECTOMY AND ADENOIDECTOMY  1944   TRANSURETHRAL INCISION OF PROSTATE N/A 12/28/2012   Procedure: TRANSURETHRAL INCISION OF THE PROSTATE (TUIP);  Surgeon: Bernestine Amass, MD;  Location: [facility];  Service: Urology;  Laterality: N/A;   ULNAR NERVE TRANSPOSITION      Current Medications: Current Meds  Medication Sig   ACCU-CHEK FASTCLIX LANCETS MISC Check blood sugar three times daily   blood glucose meter kit and supplies Dispense based on patient and insurance preference. Check blood sugars daily and as needed Dx: e11.9   Blood Pressure KIT Take blood pressure twice weekly.   buPROPion (WELLBUTRIN) 75 MG tablet TAKE 1 TABLET(75 MG) BY MOUTH TWICE DAILY (Patient taking differently: Take 75 mg by mouth 2 (two) times daily.)    carbidopa-levodopa (SINEMET IR) 25-100 MG tablet Take 2 tablets by mouth 3 (three) times daily.   Cobalamin Combinations (B12 FOLATE PO) Take 2 tablets by mouth daily at 12 noon.   GEMTESA 75 MG TABS Take 75 mg by mouth daily.   ibuprofen (ADVIL) 400 MG tablet Take 400 mg by mouth every 6 (six) hours as needed for mild pain.   levothyroxine (SYNTHROID) 75 MCG tablet TAKE 1 TABLET(75 MCG) BY MOUTH DAILY BEFORE AND BREAKFAST (Patient taking differently: Take 75 mcg by mouth daily before breakfast.)   loperamide (IMODIUM A-D) 2 MG tablet Take 4 mg by mouth daily.   Melatonin 10 MG TABS Take 10 mg by mouth at bedtime.   metFORMIN (GLUCOPHAGE) 1000 MG tablet TAKE 1 TABLET(1000 MG) BY MOUTH TWICE DAILY WITH A MEAL (Patient taking differently: Take 1,000 mg by mouth 2 (two) times daily with a meal. Take 1  tablet daily at 0600 and 1 tablet daily at 1400)   midodrine (PROAMATINE) 2.5 MG tablet Take 1 tablet (2.5 mg) as needed for systolic (top number) of blood pressure is 100 or less   psyllium (REGULOID) 0.52 g capsule Take 0.52 g by mouth daily.   rivaroxaban (XARELTO) 20 MG TABS tablet Take 1 tablet (20 mg total) by mouth daily with supper. NEEDS CARDIOLOGY APPOINTMENT, PLEASE CALL OFFICE   sertraline (ZOLOFT) 100 MG tablet Take 1 tablet (100 mg total) by mouth daily.   simvastatin (ZOCOR) 40 MG tablet Take 1 tablet (40 mg total) by mouth at bedtime.   tamsulosin (FLOMAX) 0.4 MG CAPS capsule Take 0.4 mg by mouth at bedtime.   traZODone (DESYREL) 50 MG tablet Take 50 mg by mouth at bedtime.   [DISCONTINUED] FARXIGA 10 MG TABS tablet TAKE 1 TABLET(10 MG) BY MOUTH DAILY (Patient taking differently: Take 10 mg by mouth daily.)     Allergies:   Covid-19 mrna vacc (moderna) and Penicillins   Social History   Socioeconomic History   Marital status: Widowed    Spouse name: Not on file   Number of children: 0   Years of education: Not on file   Highest education level: Bachelor's degree (e.g., BA, AB,  BS)  Occupational History   Occupation: retired    Fish farm manager: RETIRED    Comment: executive   Tobacco Use   Smoking status: Former    Packs/day: 1.00    Years: 10.00    Total pack years: 10.00    Types: Cigarettes    Quit date: 03/25/1968    Years since quitting: 54.0   Smokeless tobacco: Never  Vaping Use   Vaping Use: Never used  Substance and Sexual Activity   Alcohol use: Yes    Alcohol/week: 7.0 standard drinks of alcohol    Types: 7 Glasses of wine per week    Comment: 1 glass wine daily   Drug use: No   Sexual activity: Not Currently  Other Topics Concern   Not on file  Social History Narrative   Lives at [facility] independent living   Left handed   Lives on 2nd level   Lost wife 01/21/2019, dementia      Social Determinants of Health   Financial Resource Strain: Not on file  Food Insecurity: Not on file  Transportation Needs: Not on file  Physical Activity: Not on file  Stress: Not on file  Social Connections: Not on file     Family History: The patient's family history includes Heart Problems in his brother; Lung cancer in his mother; Parkinson's disease in his brother; Stroke in his father.  ROS:   Please see the history of present illness.     All other systems reviewed and are negative.  EKGs/Labs/Other Studies Reviewed:    The following studies were reviewed today:  Echo 12/07/2021  1. Left ventricular ejection fraction, by estimation, is 55 to 60%. The  left ventricle has normal function. The left ventricle has no regional  wall motion abnormalities. Left ventricular diastolic parameters were  normal.   2. Right ventricular systolic function is normal. The right ventricular  size is normal. There is normal pulmonary artery systolic pressure. The  estimated right ventricular systolic pressure is 16.1 mmHg.   3. The mitral valve is normal in structure. Trivial mitral valve  regurgitation. No evidence of mitral stenosis.   4. The aortic valve is  tricuspid. There is mild calcification of the  aortic valve. There  is mild thickening of the aortic valve. Aortic valve  regurgitation is not visualized. No aortic stenosis is present.   Comparison(s): No significant change from prior study.   Conclusion(s)/Recommendation(s): Otherwise normal echocardiogram, with  minor abnormalities described in the report. Technically difficult images,  but overall LVEF is normal without severe wall motion abnormalities.  Reduced sensitivity for minor changes given image quality.   EKG:  EKG is not ordered today.    Recent Labs: 12/17/2021: ALT 11; Magnesium 1.6 04/04/2022: BUN 17; Creatinine, Ser 0.80; Hemoglobin 10.7; Platelets 218; Potassium 3.3; Sodium 138  Recent Lipid Panel    Component Value Date/Time   CHOL 202 (H) 10/17/2020 1456   TRIG 146.0 10/17/2020 1456   TRIG 83 02/20/2006 0838   HDL 39.50 10/17/2020 1456   CHOLHDL 5 10/17/2020 1456   VLDL 29.2 10/17/2020 1456   LDLCALC 134 (H) 10/17/2020 1456   LDLCALC 87 12/07/2019 1520     Risk Assessment/Calculations:    CHA2DS2-VASc Score = 3   This indicates a 3.2% annual risk of stroke. The patient's score is based upon: CHF History: 0 HTN History: 0 Diabetes History: 1 Stroke History: 0 Vascular Disease History: 0 Age Score: 2 Gender Score: 0          Physical Exam:    VS:  BP (!) 90/50   Pulse 92   Ht '5\' 9"'$  (1.753 m)   Wt 136 lb 9.6 oz (62 kg)   SpO2 99%   BMI 20.17 kg/m         Wt Readings from Last 3 Encounters:  04/04/22 136 lb 9.6 oz (62 kg)  12/17/21 130 lb 3.2 oz (59.1 kg)  03/19/21 147 lb 14.9 oz (67.1 kg)     GEN:  Well nourished, well developed in no acute distress HEENT: Normal NECK: No JVD; No carotid bruits LYMPHATICS: No lymphadenopathy CARDIAC: RRR, no murmurs, rubs, gallops RESPIRATORY:  Clear to auscultation without rales, wheezing or rhonchi  ABDOMEN: Soft, non-tender, non-distended MUSCULOSKELETAL:  No edema; No deformity  SKIN: Warm  and dry NEUROLOGIC:  Alert and oriented x 3 PSYCHIATRIC:  Normal affect   ASSESSMENT:    1. Productive cough   2. Hypotension, unspecified hypotension type   3. PAF (paroxysmal atrial fibrillation) ([place])   4. Pacemaker   5. Controlled type 2 diabetes mellitus without complication, without long-term current use of insulin (HCC)    PLAN:    In order of problems listed above:  Productive cough: Symptoms started since last night.  I recommended a CBC with differential and chest x-ray  Hypotension: Blood pressure low.  I have decided to stop his Wilder Glade and add low-dose midodrine for systolic blood pressure lower than 100 mmHg.  Will follow basic metabolic panel and CBC to make sure there is no sign of infection.  PAF: Low A-fib burden on last pacemaker interrogation.  On Xarelto  History of pacemaker: Recent interrogation by Dr. Sallyanne Kuster  DM2: Managed by primary care provider.           Medication Adjustments/Labs and Tests Ordered: Current medicines are reviewed at length with the patient today.  Concerns regarding medicines are outlined above.  Orders Placed This Encounter  Procedures   DG Chest 1 View   Basic metabolic panel   CBC   Meds ordered this encounter  Medications   midodrine (PROAMATINE) 2.5 MG tablet    Sig: Take 1 tablet (2.5 mg) as needed for systolic (top number) of blood pressure is 100 or less  Dispense:  60 tablet    Refill:  1    Patient Instructions  Medication Instructions:  STOP Farxiga  START Midodrine 2.5 mg times a day as needed for top number of blood pressure 100 or less  *If you need a refill on your cardiac medications before your next appointment, please call your pharmacy*  Lab Work: Your physician recommends that you return for lab work TODAY:  BMP CBC  If you have labs (blood work) drawn today and your tests are completely normal, you will receive your results only by: [place] (if you have MyChart) OR A paper copy  in the mail If you have any lab test that is abnormal or we need to change your treatment, we will call you to review the results.  Testing/Procedures: A chest x-ray takes a picture of the organs and structures inside the chest, including the heart, lungs, and blood vessels. This test can show several things, including, whether the heart is enlarges; whether fluid is building up in the lungs; and whether pacemaker / defibrillator leads are still in place. This test is performed at [facility] [address], [place] [address]  Follow-Up: At [facility], you and your health needs are our priority.  As part of our continuing mission to provide you with exceptional heart care, we have created designated Provider Care Teams.  These Care Teams include your primary Cardiologist (physician) and Advanced Practice Providers (APPs -  Physician Assistants and Nurse Practitioners) who all work together to provide you with the care you need, when you need it.   Your next appointment:   4 week(s)  Provider:   Almyra Deforest, PA-C or APP        Other Instructions    Signed, Almyra Deforest, [place]  04/06/2022 9:52 PM    [place]

## 2022-04-04 NOTE — Patient Instructions (Signed)
Medication Instructions:  STOP Farxiga  START Midodrine 2.5 mg times a day as needed for top number of blood pressure 100 or less  *If you need a refill on your cardiac medications before your next appointment, please call your pharmacy*  Lab Work: Your physician recommends that you return for lab work TODAY:  BMP CBC  If you have labs (blood work) drawn today and your tests are completely normal, you will receive your results only by: [place] (if you have MyChart) OR A paper copy in the mail If you have any lab test that is abnormal or we need to change your treatment, we will call you to review the results.  Testing/Procedures: A chest x-ray takes a picture of the organs and structures inside the chest, including the heart, lungs, and blood vessels. This test can show several things, including, whether the heart is enlarges; whether fluid is building up in the lungs; and whether pacemaker / defibrillator leads are still in place. This test is performed at [facility] [address], [place] [address]  Follow-Up: At [facility], you and your health needs are our priority.  As part of our continuing mission to provide you with exceptional heart care, we have created designated Provider Care Teams.  These Care Teams include your primary Cardiologist (physician) and Advanced Practice Providers (APPs -  Physician Assistants and Nurse Practitioners) who all work together to provide you with the care you need, when you need it.   Your next appointment:   4 week(s)  Provider:   Almyra Deforest, PA-C or APP        Other Instructions

## 2022-04-05 LAB — CBC
Hematocrit: 32.3 % — ABNORMAL LOW (ref 37.5–51.0)
Hemoglobin: 10.7 g/dL — ABNORMAL LOW (ref 13.0–17.7)
MCH: 32.3 pg (ref 26.6–33.0)
MCHC: 33.1 g/dL (ref 31.5–35.7)
MCV: 98 fL — ABNORMAL HIGH (ref 79–97)
Platelets: 218 10*3/uL (ref 150–450)
RBC: 3.31 x10E6/uL — ABNORMAL LOW (ref 4.14–5.80)
RDW: 13.4 % (ref 11.6–15.4)
WBC: 12.9 10*3/uL — ABNORMAL HIGH (ref 3.4–10.8)

## 2022-04-05 LAB — BASIC METABOLIC PANEL
BUN/Creatinine Ratio: 21 (ref 10–24)
BUN: 17 mg/dL (ref 8–27)
CO2: 20 mmol/L (ref 20–29)
Calcium: 9.1 mg/dL (ref 8.6–10.2)
Chloride: 101 mmol/L (ref 96–106)
Creatinine, Ser: 0.8 mg/dL (ref 0.76–1.27)
Glucose: 204 mg/dL — ABNORMAL HIGH (ref 70–99)
Potassium: 3.3 mmol/L — ABNORMAL LOW (ref 3.5–5.2)
Sodium: 138 mmol/L (ref 134–144)
eGFR: 87 mL/min/{1.73_m2} (ref >59)

## 2022-04-06 ENCOUNTER — Encounter: Payer: Self-pay | Admitting: Physician Assistant

## 2022-04-09 ENCOUNTER — Telehealth: Payer: Self-pay

## 2022-04-09 NOTE — Telephone Encounter (Addendum)
Tried calling patient and the number listed a recording says " We're sorry you call can not be completed at this time. Please hang up and try your call again later." Will try calling again   ----- Message from [place], [place] sent at 04/05/2022  1:44 PM EST ----- Stable renal function, potassium borderline low, recommend eat more potassium rich food including banana, broccoli and spinach. Red blood cell count stable. White blood cell count high which could be a sign of infection. Monitor for fever, chill or cough. Please have the patient also see his PCP for additional evaluation of elevated white blood cell count. Forward copy of lab result to PCP.

## 2022-04-11 NOTE — Progress Notes (Signed)
Remote pacemaker transmission.   

## 2022-04-30 DIAGNOSIS — D1801 Hemangioma of skin and subcutaneous tissue: Secondary | ICD-10-CM | POA: Diagnosis not present

## 2022-04-30 DIAGNOSIS — L814 Other melanin hyperpigmentation: Secondary | ICD-10-CM | POA: Diagnosis not present

## 2022-04-30 DIAGNOSIS — Z85828 Personal history of other malignant neoplasm of skin: Secondary | ICD-10-CM | POA: Diagnosis not present

## 2022-04-30 DIAGNOSIS — L570 Actinic keratosis: Secondary | ICD-10-CM | POA: Diagnosis not present

## 2022-04-30 DIAGNOSIS — L821 Other seborrheic keratosis: Secondary | ICD-10-CM | POA: Diagnosis not present

## 2022-05-02 NOTE — Progress Notes (Signed)
Cardiology Clinic Note   Patient Name: Stephen Robbins Date of Encounter: 05/03/2022  Primary Care Provider:  Javier Glazier, MD Primary Cardiologist:  Sanda Klein, MD  Patient Profile    Stephen Robbins 87 year old male presents the clinic today for follow-up evaluation of his NSVT and paroxysmal atrial fibrillation.  Past Medical History    Past Medical History:  Diagnosis Date   Arthritis    "joints; a little" (01/04/2014)   Asymptomatic bilateral carotid artery stenosis    per duplex  05-15-2012  left >39%/   right 40-59%   Basal cell carcinoma    nose   Borderline diabetes    BPH (benign prostatic hypertrophy) with urinary obstruction    Bradycardia    Bronchial pneumonia 1958   Chronotropic incompetence with sinus node dysfunction (HCC)    Compressed cervical disc    Compression of lumbar vertebra (HCC)    L4 -- L5   Dizzy    Dysmetabolic syndrome    Fatigue    Frequency of urination    GERD (gastroesophageal reflux disease)    occasional   Hemorrhoids    Hyperlipidemia    Hypothyroidism    Nocturia    NSVT (nonsustained ventricular tachycardia) [facility])    cardiologist-  dr croitoru   Pacemaker    Urgency of urination    Wears glasses    Past Surgical History:  Procedure Laterality Date   BASAL CELL CARCINOMA EXCISION     nose   CLOSED REDUCTION NASAL FRACTURE  09-01-2007   CYSTOSCOPY N/A 12/28/2012   Procedure: CYSTOSCOPY;  Surgeon: Bernestine Amass, MD;  Location: [facility];  Service: Urology;  Laterality: N/A;   EXERCISE TOLERENCE TEST  12-03-2012  DR CROITURO   CHRONOTROPIC INCOMPETENCE/ NORMAL RESTING BP W/ APPROPRIATE RESPONSE/ NO CHEST PAIN/ NO ST CHANGES FROM BASELINE   INSERT / REPLACE / REMOVE PACEMAKER  01/04/2014   [employer] model L121 serial number [id number]   LACERATION REPAIR Right 1978   middle finger   NEUROPLASTY / TRANSPOSITION ULNAR NERVE AT ELBOW Left 02-09-2010   PERMANENT PACEMAKER INSERTION N/A  01/04/2014   Procedure: PERMANENT PACEMAKER INSERTION;  Surgeon: Sanda Klein, MD;  Location: [place] [facility];  Service: Cardiovascular;  Laterality: N/A;   SKIN BIOPSY     scc   TONSILLECTOMY AND ADENOIDECTOMY  1944   TRANSURETHRAL INCISION OF PROSTATE N/A 12/28/2012   Procedure: TRANSURETHRAL INCISION OF THE PROSTATE (TUIP);  Surgeon: Bernestine Amass, MD;  Location: [facility];  Service: Urology;  Laterality: N/A;   ULNAR NERVE TRANSPOSITION      Allergies  Allergies  Allergen Reactions   Covid-19 Mrna Vacc (Moderna)     "Couldn't move". No rash, no itching    Penicillins Other (See Comments)    Unknown childhood reaction    History of Present Illness    Stephen Robbins has a PMH of paroxysmal atrial fibrillation on Xarelto.  Was detected on PPM interrogation, sick sinus syndrome status post [facility] Scientific dual-chamber PPM 2013-06-22, chronotropic incompetence, Parkinson's disease, type 2 diabetes, and asymptomatic brief nonsustained VT detected on pacemaker.  He is a resident at [place].  His wife passed away in 06/23/2018.  CHA2DS2-VASc score 3 (age x 2, diabetes)  He was seen in follow-up by Dr. Sallyanne Kuster 11/22.  His device interrogation at that time showed very low A-fib burden less than 1%.  Dr. Sallyanne Kuster was concerned of falling episodes and orthostatic dizziness.  His  device interrogation 1/23 showed only 3 minutes of A-fib.  He was seen in follow-up by Almyra Deforest PA-C on 04/04/2022.  During that time he noted weakness and denied change in his functional ability.  His blood pressure on arrival was 99 systolic.  On recheck it was 90/50.  He did note slight dizziness.  He reported a productive cough that produced brownish sputum.  A CBC and BMP were ordered as well as a chest x-ray.  His CBC showed elevated white count.  Due to his hypotension his Wilder Glade was stopped and he was prescribed midodrine 2.5 mg twice daily for systolic blood pressure less than 100.  4-week follow-up was  recommended.  Due to his elevated white count he was encouraged to follow-up with his PCP.  He presents to the clinic today for follow-up evaluation and states he feels fairly well.  He does note that he has trouble occasionally with swallowing.  He has had trouble in the past with choking on food.  He has been evaluated both at [facility] and at [facility] by speech therapy.  He continues on thin liquids and regular diet.  She presents with an aide today.  She reports that he has been receiving his medications but she does not know if his blood pressure is being checked each time before midodrine dosing.  I reinforced the importance of blood pressure checks prior to administering medication.  His blood pressure today is 96/64.  We will continue medication for systolic blood pressure less than 100 twice daily.  We reviewed his most recent pacemaker interrogation.  He expressed understanding.  Will plan follow-up in 3 to 4 months.  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  Today he denies chest pain, shortness of breath, lower extremity edema, fatigue, palpitations, melena, hematuria, hemoptysis, diaphoresis, weakness, presyncope, syncope, orthopnea, and PND.     Home Medications    Prior to Admission medications   Medication Sig Start Date End Date Taking? Authorizing Provider  ACCU-CHEK FASTCLIX LANCETS MISC Check blood sugar three times daily 02/12/18   Colon Branch, MD  blood glucose meter kit and supplies Dispense based on patient and insurance preference. Check blood sugars daily and as needed Dx: e11.9 03/14/21   Colon Branch, MD  Blood Pressure KIT Take blood pressure twice weekly. 03/14/21   Colon Branch, MD  buPROPion (WELLBUTRIN) 75 MG tablet TAKE 1 TABLET(75 MG) BY MOUTH TWICE DAILY Patient taking differently: Take 75 mg by mouth 2 (two) times daily. 05/03/21   Colon Branch, MD  carbidopa-levodopa (SINEMET IR) 25-100 MG tablet Take 2 tablets by mouth 3 (three) times daily.  05/28/21   [name], [name], DO  Cobalamin Combinations (B12 FOLATE PO) Take 2 tablets by mouth daily at 12 noon.    [provider]  GEMTESA 75 MG TABS Take 75 mg by mouth daily. 11/02/20   [provider]  ibuprofen (ADVIL) 400 MG tablet Take 400 mg by mouth every 6 (six) hours as needed for mild pain.    [provider]  levothyroxine (SYNTHROID) 75 MCG tablet TAKE 1 TABLET(75 MCG) BY MOUTH DAILY BEFORE AND BREAKFAST Patient taking differently: Take 75 mcg by mouth daily before breakfast. 01/01/21   Colon Branch, MD  loperamide (IMODIUM A-D) 2 MG tablet Take 4 mg by mouth daily.    [provider]  Melatonin 10 MG TABS Take 10 mg by mouth at bedtime.    [provider]  metFORMIN (GLUCOPHAGE) 1000 MG tablet TAKE 1 TABLET(1000 MG) BY MOUTH TWICE DAILY WITH A MEAL Patient taking differently: Take 1,000 mg by mouth 2 (two) times daily with a meal. Take 1 tablet daily at 0600 and 1 tablet daily at 1400 10/10/20   Colon Branch, MD  midodrine (PROAMATINE) 2.5 MG tablet Take 1 tablet (2.5 mg) as needed for systolic (top number) of blood pressure is 100 or less 04/04/22   Almyra Deforest, PA  psyllium (REGULOID) 0.52 g capsule Take 0.52 g by mouth daily.    [provider]  rivaroxaban (XARELTO) 20 MG TABS tablet Take 1 tablet (20 mg total) by mouth daily with supper. NEEDS CARDIOLOGY APPOINTMENT, PLEASE CALL OFFICE 06/25/21   Croitoru, [name], MD  sertraline (ZOLOFT) 100 MG tablet Take 1 tablet (100 mg total) by mouth daily. 12/07/20   Colon Branch, MD  simvastatin (ZOCOR) 40 MG tablet Take 1 tablet (40 mg total) by mouth at bedtime. 10/23/20   Colon Branch, MD  tamsulosin (FLOMAX) 0.4 MG CAPS capsule Take 0.4 mg by mouth at bedtime. 04/03/20   [provider]  traZODone (DESYREL) 50 MG tablet Take 50 mg by mouth at bedtime.    [provider]    Family History    Family History  Problem Relation Age of Onset   Stroke Father    Lung cancer Mother     Parkinson's disease Brother    Heart Problems Brother    He indicated that his mother is deceased. He indicated that his father is deceased. He indicated that his brother is alive.  Social History    Social History   Socioeconomic History   Marital status: Widowed    Spouse name: Not on file   Number of children: 0   Years of education: Not on file   Highest education level: Bachelor's degree (e.g., BA, AB, BS)  Occupational History   Occupation: retired    Fish farm manager: RETIRED    Comment: executive   Tobacco Use   Smoking status: Former    Packs/day: 1.00    Years: 10.00    Total pack years: 10.00    Types: Cigarettes    Quit date: 03/25/1968    Years since quitting: 54.1   Smokeless tobacco: Never  Vaping Use   Vaping Use: Never used  Substance and Sexual Activity   Alcohol use: Yes    Alcohol/week: 7.0 standard drinks of alcohol    Types: 7 Glasses of wine per week    Comment: 1 glass wine daily   Drug use: No   Sexual activity: Not Currently  Other Topics Concern   Not on file  Social History Narrative   Lives at [facility]   Left handed   Lives on 2nd level   Lost wife 01/21/2019, dementia      Social Determinants of Health   Financial Resource Strain: Not on file  Food Insecurity: Not on file  Transportation Needs: Not on file  Physical Activity: Not on file  Stress: Not on file  Social Connections: Not on file  Intimate Partner Violence: Not on file     Review of Systems    General:  No chills, fever, night sweats or weight changes.  Cardiovascular:  No chest pain, dyspnea on exertion, edema, orthopnea, palpitations, paroxysmal nocturnal dyspnea. Dermatological: No rash, lesions/masses Respiratory: No cough, dyspnea Urologic: No hematuria, dysuria Abdominal:   No nausea, vomiting, diarrhea, bright red blood per rectum, melena, or  hematemesis Neurologic:  No visual changes, wkns, changes in mental status. All other systems  reviewed and are otherwise negative except as noted above.  Physical Exam    VS:  BP 96/64   Pulse 98   Ht 5' 9"$  (1.753 m)   Wt 137 lb 6.4 oz (62.3 kg)   SpO2 97%   BMI 20.29 kg/m  , BMI Body mass index is 20.29 kg/m. GEN: Well nourished, well developed, in no acute distress. HEENT: normal. Neck: Supple, no JVD, carotid bruits, or masses. Cardiac: RRR, no murmurs, rubs, or gallops. No clubbing, cyanosis, edema.  Radials/DP/PT 2+ and equal bilaterally.  Respiratory:  Respirations regular and unlabored, clear to auscultation bilaterally. GI: Soft, nontender, nondistended, BS + x 4. MS: no deformity or atrophy. Skin: warm and dry, no rash. Neuro:  Strength and sensation are intact. Psych: Normal affect.  Accessory Clinical Findings    Recent Labs: 12/17/2021: ALT 11; Magnesium 1.6 04/04/2022: BUN 17; Creatinine, Ser 0.80; Hemoglobin 10.7; Platelets 218; Potassium 3.3; Sodium 138   Recent Lipid Panel    Component Value Date/Time   CHOL 202 (H) 10/17/2020 1456   TRIG 146.0 10/17/2020 1456   TRIG 83 02/20/2006 0838   HDL 39.50 10/17/2020 1456   CHOLHDL 5 10/17/2020 1456   VLDL 29.2 10/17/2020 1456   LDLCALC 134 (H) 10/17/2020 1456   LDLCALC 87 12/07/2019 1520         ECG personally reviewed by me today-none today.   Echocardiogram 12/07/2021   1. Left ventricular ejection fraction, by estimation, is 55 to 60%. The  left ventricle has normal function. The left ventricle has no regional  wall motion abnormalities. Left ventricular diastolic parameters were  normal.   2. Right ventricular systolic function is normal. The right ventricular  size is normal. There is normal pulmonary artery systolic pressure. The  estimated right ventricular systolic pressure is 99991111 mmHg.   3. The mitral valve is normal in structure. Trivial mitral valve  regurgitation. No evidence of mitral stenosis.   4. The aortic valve is tricuspid. There is mild calcification of the  aortic valve.  There is mild thickening of the aortic valve. Aortic valve  regurgitation is not visualized. No aortic stenosis is present.   Comparison(s): No significant change from prior study.   Conclusion(s)/Recommendation(s): Otherwise normal echocardiogram, with  minor abnormalities described in the report. Technically difficult images,  but overall LVEF is normal without severe wall motion abnormalities.  Reduced sensitivity for minor changes given image quality.  Assessment & Plan   1.  Hypotension-BP today 96/64.  Seen in the clinic on 03/25/2022.  Noted to have low blood pressure in the setting of productive cough with brownish sputum.  White count was elevated.  He was prescribed midodrine 2.5 mg twice daily for systolic blood pressure less than 100. Continue midodrine-check blood pressure before dosing. Maintain blood pressure log  Paroxysmal atrial fibrillation-heart rate today 98 bpm.  Reports compliance with Xarelto.  Denies bleeding issues.  Noted to have low A-fib burden on pacemaker interrogation.  Denies recent falls. Avoid triggers caffeine, chocolate, EtOH, dehydration etc. Continue Xarelto  PPM-implanted in 2015.  Follows with Dr. Sallyanne Kuster.  Very low A-fib burden noted on most recent interrogation 03/20/2022.  Functioning well histograms appropriate, battery status good.   Disposition: Follow-up with Dr. Sallyanne Kuster, Almyra Deforest PA-C or me in 3-4 months.   Jossie Ng. Ceyda Peterka NP-C     05/03/2022, 12:19 PM [facility] [address]  Office [phone] Fax [phone]    I spent 14 minutes examining this patient, reviewing medications, and using patient centered shared decision making involving her cardiac care.  Prior to her visit I spent greater than 20 minutes reviewing her past medical history,  medications, and prior cardiac tests.

## 2022-05-03 ENCOUNTER — Encounter: Payer: Self-pay | Admitting: General Practice

## 2022-05-03 ENCOUNTER — Ambulatory Visit: Payer: Medicare Other | Attending: General Practice | Admitting: General Practice

## 2022-05-03 VITALS — BP 96/64 | HR 98 | Ht 69.0 in | Wt 137.4 lb

## 2022-05-03 DIAGNOSIS — Z950 Presence of cardiac pacemaker: Secondary | ICD-10-CM | POA: Diagnosis not present

## 2022-05-03 DIAGNOSIS — I959 Hypotension, unspecified: Secondary | ICD-10-CM | POA: Diagnosis not present

## 2022-05-03 DIAGNOSIS — I480 Paroxysmal atrial fibrillation: Secondary | ICD-10-CM | POA: Diagnosis not present

## 2022-05-03 NOTE — Progress Notes (Unsigned)
Assessment/Plan:   1.  Parkinsons Disease  -continue carbidopa/levodopa 25/100, 2 tablets 3 times per day.  We discussed timing again with protein and changed timing with [name].  Also discussed not to take it with his protein supplement shakes.  -Has some dyskinesia, but not that bothersome and does not require medication.    -Patient is involved with physical therapy but has been refusing it, at times.  I encouraged him to participate fully.  2.  Depression  -On sertraline, 100 mg daily.    -pcp managing  3.  Weight loss  -discussed protein and while I want him to avoid protein with levodopa, I still want him to take in protein.  We discussed boost, Ensure, halo top and other good sources of protein.  We discussed this last visit as well, but he has apparently been refusing at least 1 protein supplement per day.  I told him not to do that.  4.  Visual hallucinations  -Resolved after treatment for UTI  5.  Neurogenic Orthostatic Hypotension  -Midodrine really needs to be scheduled.  Doubt that SNF is checking blood pressure every time that they dose.  In addition, his blood pressure has consistently been low in the offices for the last several visits.  I wonder if some of his previous falls are not related to drops in blood pressure.  I wrote his blood pressure medications so that he takes midodrine, 2.5 mg 3 times per day with meals.  His caregiver is the same caregiver that was with him previous a year ago Stephen Robbins) and she was instructed to call me should blood pressures become consistently elevated, greater than 99991111 systolic.  Subjective:   Stephen Robbins was seen today in follow up for Parkinsons disease.  My previous records were reviewed prior to todays visit as well as outside records available to me. Pt with caregiver who supplements hx.   I have not seen the patient in over a year.  This is because of several missed appointments.  Patient now in long-term care.  He  has been followed by cardiology with concerns for weakness caused by orthostatic hypotension.  Midodrine was started by cardiology PA in January.  This was low-dose, and only as needed.  It stated that midodrine was 1 tablet as needed for systolic of blood pressure less than 100.  He followed up Friday with the nurse practitioner from cardiology and his blood pressure remained very low at their office (96/64) and no changes were made.  Caregiver states that back in August he was admitted for UTI, confusion, hallucinations.  Caregiver admits that he is markedly better now than he was then.  pt states that he is no longer having hallucinations but he does sometimes talk to his cat that isn't there.  There was a lady across the hall who was yelling out and he would sometimes think he heard a sheep, but it turns out it was just her.  She also recently passed away.  Working with PT 2 days per week.  Patient and caregiver both state that he has not had any falls in a long time.  Current movement disorder medications: Carbidopa/levodopa 25/100, 2 tablets 3 times per day (states taking breakfast, lunch dinner) Gabapentin, 300 mg twice per day Sertraline, 100 mg daily   ALLERGIES:   Allergies  Allergen Reactions   Covid-19 Mrna Vacc (Moderna)     "Couldn't move". No rash, no itching    Penicillins Other (See Comments)  Unknown childhood reaction    CURRENT MEDICATIONS:  Outpatient Encounter Medications as of 05/06/2022  Medication Sig   ACCU-CHEK FASTCLIX LANCETS MISC Check blood sugar three times daily   blood glucose meter kit and supplies Dispense based on patient and insurance preference. Check blood sugars daily and as needed Dx: e11.9   Blood Pressure KIT Take blood pressure twice weekly.   buPROPion (WELLBUTRIN) 75 MG tablet TAKE 1 TABLET(75 MG) BY MOUTH TWICE DAILY (Patient taking differently: Take 75 mg by mouth 2 (two) times daily.)   carbidopa-levodopa (SINEMET IR) 25-100 MG tablet Take 2  tablets by mouth 3 (three) times daily.   Cobalamin Combinations (B12 FOLATE PO) Take 2 tablets by mouth daily at 12 noon.   GEMTESA 75 MG TABS Take 75 mg by mouth daily.   ibuprofen (ADVIL) 400 MG tablet Take 400 mg by mouth every 6 (six) hours as needed for mild pain.   levothyroxine (SYNTHROID) 75 MCG tablet TAKE 1 TABLET(75 MCG) BY MOUTH DAILY BEFORE AND BREAKFAST (Patient taking differently: Take 75 mcg by mouth daily before breakfast.)   loperamide (IMODIUM A-D) 2 MG tablet Take 4 mg by mouth daily.   Melatonin 10 MG TABS Take 10 mg by mouth at bedtime.   metFORMIN (GLUCOPHAGE) 1000 MG tablet TAKE 1 TABLET(1000 MG) BY MOUTH TWICE DAILY WITH A MEAL (Patient taking differently: Take 1,000 mg by mouth 2 (two) times daily with a meal. Take 1 tablet daily at 0600 and 1 tablet daily at 1400)   psyllium (REGULOID) 0.52 g capsule Take 0.52 g by mouth daily.   rivaroxaban (XARELTO) 20 MG TABS tablet Take 1 tablet (20 mg total) by mouth daily with supper. NEEDS CARDIOLOGY APPOINTMENT, PLEASE CALL OFFICE   sertraline (ZOLOFT) 100 MG tablet Take 1 tablet (100 mg total) by mouth daily.   simvastatin (ZOCOR) 40 MG tablet Take 1 tablet (40 mg total) by mouth at bedtime.   tamsulosin (FLOMAX) 0.4 MG CAPS capsule Take 0.4 mg by mouth at bedtime.   traZODone (DESYREL) 50 MG tablet Take 50 mg by mouth at bedtime.   [DISCONTINUED] midodrine (PROAMATINE) 2.5 MG tablet Take 1 tablet (2.5 mg) as needed for systolic (top number) of blood pressure is 100 or less   midodrine (PROAMATINE) 2.5 MG tablet Take 1 tablet (2.5 mg total) by mouth 3 (three) times daily with meals.   No facility-administered encounter medications on file as of 05/06/2022.    Objective:   PHYSICAL EXAMINATION:    VITALS:   Vitals:   05/06/22 1327  BP: 100/60  Pulse: 76  SpO2: 98%  Weight: 133 lb 6.4 oz (60.5 kg)  Height: 5' 9"$  (1.753 m)     Wt Readings from Last 3 Encounters:  05/06/22 133 lb 6.4 oz (60.5 kg)  05/03/22 137 lb  6.4 oz (62.3 kg)  04/04/22 136 lb 9.6 oz (62 kg)     GEN:  The patient appears stated age and is in NAD.  He appears frail HEENT:  Normocephalic, atraumatic.   The mucous membranes are moist. The superficial temporal arteries are without ropiness or tenderness. CV:  RRR Lungs:  CTAB Neck/HEME:  There are no carotid bruits bilaterally.  Neurological examination:  Orientation: The patient is alert and oriented x3.  He asks appropriate questions and recognizes this examiner. Cranial nerves: There is good facial symmetry with facial hypomimia. The speech is fluent and clear but hypophonic. Soft palate rises symmetrically and there is no tongue deviation. Hearing is intact to conversational  tone. Sensation: Sensation is intact to light touch throughout Motor: Strength is at least antigravity x4.  Movement examination: Tone: There is nl tone in the UE/LE Abnormal movements: there is dyskinesia in the legs bilaterally and some in the head. Coordination:  There is no decremation with RAM's Gait and Station: Patient pushes off of the wheelchair to arise.  He is given mild assistance.  He is given a walker.  He is forward flexed with a walker and mildly unsteady, but generally does pretty well.  I have reviewed and interpreted the following labs independently    Chemistry      Component Value Date/Time   NA 138 04/04/2022 1148   K 3.3 (L) 04/04/2022 1148   CL 101 04/04/2022 1148   CO2 20 04/04/2022 1148   BUN 17 04/04/2022 1148   CREATININE 0.80 04/04/2022 1148   CREATININE 0.93 12/07/2019 1520      Component Value Date/Time   CALCIUM 9.1 04/04/2022 1148   ALKPHOS 75 12/17/2021 1529   AST 20 12/17/2021 1529   ALT 11 12/17/2021 1529   BILITOT 0.4 12/17/2021 1529       Lab Results  Component Value Date   WBC 12.9 (H) 04/04/2022   HGB 10.7 (L) 04/04/2022   HCT 32.3 (L) 04/04/2022   MCV 98 (H) 04/04/2022   PLT 218 04/04/2022    Lab Results  Component Value Date   TSH 3.879  11/21/2020     Total time spent on today's visit was 41 minutes, including both face-to-face time and nonface-to-face time.  Time included that spent on review of records (prior notes available to me/labs/imaging if pertinent), discussing treatment and goals, answering patient's questions and coordinating care.  Cc:  Stephen Glazier, MD

## 2022-05-03 NOTE — Patient Instructions (Signed)
Medication Instructions:  May take his Midodrine twice daily if his systolic number is less that 100 *If you need a refill on your cardiac medications before your next appointment, please call your pharmacy*  Please take his blood pressure in the morning and in the evening before he takes his medications.    Lab Work: NONE If you have labs (blood work) drawn today and your tests are completely normal, you will receive your results only by: [place] (if you have MyChart) OR A paper copy in the mail If you have any lab test that is abnormal or we need to change your treatment, we will call you to review the results.   Testing/Procedures: NONE   Follow-Up: At [facility], you and your health needs are our priority.  As part of our continuing mission to provide you with exceptional heart care, we have created designated Provider Care Teams.  These Care Teams include your primary Cardiologist (physician) and Advanced Practice Providers (APPs -  Physician Assistants and Nurse Practitioners) who all work together to provide you with the care you need, when you need it.  We recommend signing up for the patient portal called "MyChart".  Sign up information is provided on this After Visit Summary.  MyChart is used to connect with patients for Virtual Visits (Telemedicine).  Patients are able to view lab/test results, encounter notes, upcoming appointments, etc.  Non-urgent messages can be sent to your provider as well.   To learn more about what you can do with MyChart, go to [email or web address].    Your next appointment:   3-4 month(s)  Provider:   Sanda Klein, MD

## 2022-05-06 ENCOUNTER — Encounter: Payer: Self-pay | Admitting: Neurology

## 2022-05-06 ENCOUNTER — Ambulatory Visit (INDEPENDENT_AMBULATORY_CARE_PROVIDER_SITE_OTHER): Payer: Medicare Other | Admitting: Neurology

## 2022-05-06 VITALS — BP 100/60 | HR 76 | Ht 69.0 in | Wt 133.4 lb

## 2022-05-06 DIAGNOSIS — G20B2 Parkinson's disease with dyskinesia, with fluctuations: Secondary | ICD-10-CM

## 2022-05-06 DIAGNOSIS — I959 Hypotension, unspecified: Secondary | ICD-10-CM

## 2022-05-06 DIAGNOSIS — G903 Multi-system degeneration of the autonomic nervous system: Secondary | ICD-10-CM

## 2022-05-06 MED ORDER — MIDODRINE HCL 2.5 MG PO TABS: 2.5000 mg | ORAL_TABLET | Freq: Three times a day (TID) | ORAL | 1 refills | Status: DC

## 2022-05-08 DIAGNOSIS — F411 Generalized anxiety disorder: Secondary | ICD-10-CM | POA: Diagnosis not present

## 2022-05-08 DIAGNOSIS — F331 Major depressive disorder, recurrent, moderate: Secondary | ICD-10-CM | POA: Diagnosis not present

## 2022-05-08 DIAGNOSIS — F5101 Primary insomnia: Secondary | ICD-10-CM | POA: Diagnosis not present

## 2022-05-10 ENCOUNTER — Ambulatory Visit: Payer: Medicare Other | Admitting: Neurology

## 2022-06-19 ENCOUNTER — Ambulatory Visit (INDEPENDENT_AMBULATORY_CARE_PROVIDER_SITE_OTHER): Payer: Medicare Other

## 2022-06-19 DIAGNOSIS — I495 Sick sinus syndrome: Secondary | ICD-10-CM | POA: Diagnosis not present

## 2022-06-19 DIAGNOSIS — I480 Paroxysmal atrial fibrillation: Secondary | ICD-10-CM | POA: Diagnosis not present

## 2022-06-19 DIAGNOSIS — G20B1 Parkinson's disease with dyskinesia, without mention of fluctuations: Secondary | ICD-10-CM | POA: Diagnosis not present

## 2022-06-19 DIAGNOSIS — E039 Hypothyroidism, unspecified: Secondary | ICD-10-CM | POA: Diagnosis not present

## 2022-06-19 DIAGNOSIS — F419 Anxiety disorder, unspecified: Secondary | ICD-10-CM | POA: Diagnosis not present

## 2022-06-19 DIAGNOSIS — R296 Repeated falls: Secondary | ICD-10-CM | POA: Diagnosis not present

## 2022-06-19 DIAGNOSIS — N400 Enlarged prostate without lower urinary tract symptoms: Secondary | ICD-10-CM | POA: Diagnosis not present

## 2022-06-19 LAB — CUP PACEART REMOTE DEVICE CHECK
Battery Remaining Longevity: 30 mo
Battery Remaining Percentage: 32 %
Brady Statistic RA Percent Paced: 27 %
Brady Statistic RV Percent Paced: 5 %
Date Time Interrogation Session: 20240327022400
Implantable Lead Connection Status: 753985
Implantable Lead Connection Status: 753985
Implantable Lead Implant Date: 20151013
Implantable Lead Implant Date: 20151013
Implantable Lead Location: 753859
Implantable Lead Location: 753860
Implantable Lead Model: 4135
Implantable Lead Model: 4136
Implantable Lead Serial Number: 29480373
Implantable Lead Serial Number: 29608302
Implantable Pulse Generator Implant Date: 20151013
Lead Channel Impedance Value: 558 Ohm
Lead Channel Impedance Value: 598 Ohm
Lead Channel Setting Pacing Amplitude: 2 V
Lead Channel Setting Pacing Amplitude: 4 V
Lead Channel Setting Pacing Pulse Width: 1 ms
Lead Channel Setting Sensing Sensitivity: 4 mV
Pulse Gen Serial Number: 702951
Zone Setting Status: 755011

## 2022-06-25 DIAGNOSIS — E1142 Type 2 diabetes mellitus with diabetic polyneuropathy: Secondary | ICD-10-CM | POA: Diagnosis not present

## 2022-07-08 ENCOUNTER — Encounter: Payer: Self-pay | Admitting: *Deleted

## 2022-07-29 NOTE — Progress Notes (Signed)
Remote pacemaker transmission.   

## 2022-07-31 DIAGNOSIS — F331 Major depressive disorder, recurrent, moderate: Secondary | ICD-10-CM | POA: Diagnosis not present

## 2022-07-31 DIAGNOSIS — F5101 Primary insomnia: Secondary | ICD-10-CM | POA: Diagnosis not present

## 2022-07-31 DIAGNOSIS — F411 Generalized anxiety disorder: Secondary | ICD-10-CM | POA: Diagnosis not present

## 2022-08-08 DIAGNOSIS — R946 Abnormal results of thyroid function studies: Secondary | ICD-10-CM | POA: Diagnosis not present

## 2022-08-14 DIAGNOSIS — I480 Paroxysmal atrial fibrillation: Secondary | ICD-10-CM | POA: Diagnosis not present

## 2022-08-14 DIAGNOSIS — F411 Generalized anxiety disorder: Secondary | ICD-10-CM | POA: Diagnosis not present

## 2022-08-14 DIAGNOSIS — F32A Depression, unspecified: Secondary | ICD-10-CM | POA: Diagnosis not present

## 2022-08-14 DIAGNOSIS — R296 Repeated falls: Secondary | ICD-10-CM | POA: Diagnosis not present

## 2022-08-14 DIAGNOSIS — F5101 Primary insomnia: Secondary | ICD-10-CM | POA: Diagnosis not present

## 2022-08-14 DIAGNOSIS — E119 Type 2 diabetes mellitus without complications: Secondary | ICD-10-CM | POA: Diagnosis not present

## 2022-08-14 DIAGNOSIS — G20A1 Parkinson's disease without dyskinesia, without mention of fluctuations: Secondary | ICD-10-CM | POA: Diagnosis not present

## 2022-08-14 DIAGNOSIS — F331 Major depressive disorder, recurrent, moderate: Secondary | ICD-10-CM | POA: Diagnosis not present

## 2022-08-14 DIAGNOSIS — E039 Hypothyroidism, unspecified: Secondary | ICD-10-CM | POA: Diagnosis not present

## 2022-08-14 DIAGNOSIS — E785 Hyperlipidemia, unspecified: Secondary | ICD-10-CM | POA: Diagnosis not present

## 2022-08-14 DIAGNOSIS — N400 Enlarged prostate without lower urinary tract symptoms: Secondary | ICD-10-CM | POA: Diagnosis not present

## 2022-09-05 DIAGNOSIS — N390 Urinary tract infection, site not specified: Secondary | ICD-10-CM | POA: Diagnosis not present

## 2022-09-18 ENCOUNTER — Ambulatory Visit (INDEPENDENT_AMBULATORY_CARE_PROVIDER_SITE_OTHER): Payer: Medicare Other

## 2022-09-18 DIAGNOSIS — I495 Sick sinus syndrome: Secondary | ICD-10-CM | POA: Diagnosis not present

## 2022-09-18 LAB — CUP PACEART REMOTE DEVICE CHECK
Battery Remaining Longevity: 24 mo
Battery Remaining Percentage: 26 %
Brady Statistic RA Percent Paced: 28 %
Brady Statistic RV Percent Paced: 6 %
Date Time Interrogation Session: 20240626021100
Implantable Lead Connection Status: 753985
Implantable Lead Connection Status: 753985
Implantable Lead Implant Date: 20151013
Implantable Lead Implant Date: 20151013
Implantable Lead Location: 753859
Implantable Lead Location: 753860
Implantable Lead Model: 4135
Implantable Lead Model: 4136
Implantable Lead Serial Number: 29480373
Implantable Lead Serial Number: 29608302
Implantable Pulse Generator Implant Date: 20151013
Lead Channel Impedance Value: 545 Ohm
Lead Channel Impedance Value: 671 Ohm
Lead Channel Setting Pacing Amplitude: 2 V
Lead Channel Setting Pacing Amplitude: 4 V
Lead Channel Setting Pacing Pulse Width: 1 ms
Lead Channel Setting Sensing Sensitivity: 4 mV
Pulse Gen Serial Number: 702951
Zone Setting Status: 755011

## 2022-10-02 DIAGNOSIS — E039 Hypothyroidism, unspecified: Secondary | ICD-10-CM | POA: Diagnosis not present

## 2022-10-02 DIAGNOSIS — E119 Type 2 diabetes mellitus without complications: Secondary | ICD-10-CM | POA: Diagnosis not present

## 2022-10-02 DIAGNOSIS — G20A1 Parkinson's disease without dyskinesia, without mention of fluctuations: Secondary | ICD-10-CM | POA: Diagnosis not present

## 2022-10-02 DIAGNOSIS — R32 Unspecified urinary incontinence: Secondary | ICD-10-CM | POA: Diagnosis not present

## 2022-10-02 DIAGNOSIS — R1310 Dysphagia, unspecified: Secondary | ICD-10-CM | POA: Diagnosis not present

## 2022-10-02 DIAGNOSIS — R6889 Other general symptoms and signs: Secondary | ICD-10-CM | POA: Diagnosis not present

## 2022-10-02 DIAGNOSIS — I480 Paroxysmal atrial fibrillation: Secondary | ICD-10-CM | POA: Diagnosis not present

## 2022-10-02 DIAGNOSIS — R269 Unspecified abnormalities of gait and mobility: Secondary | ICD-10-CM | POA: Diagnosis not present

## 2022-10-02 DIAGNOSIS — Z7984 Long term (current) use of oral hypoglycemic drugs: Secondary | ICD-10-CM | POA: Diagnosis not present

## 2022-10-02 DIAGNOSIS — F39 Unspecified mood [affective] disorder: Secondary | ICD-10-CM | POA: Diagnosis not present

## 2022-10-07 DIAGNOSIS — R319 Hematuria, unspecified: Secondary | ICD-10-CM | POA: Diagnosis not present

## 2022-10-09 NOTE — Progress Notes (Signed)
 Remote pacemaker transmission.   

## 2022-10-14 DIAGNOSIS — G20B2 Parkinson's disease with dyskinesia, with fluctuations: Secondary | ICD-10-CM | POA: Diagnosis not present

## 2022-10-14 DIAGNOSIS — R1312 Dysphagia, oropharyngeal phase: Secondary | ICD-10-CM | POA: Diagnosis not present

## 2022-10-15 DIAGNOSIS — G20B2 Parkinson's disease with dyskinesia, with fluctuations: Secondary | ICD-10-CM | POA: Diagnosis not present

## 2022-10-15 DIAGNOSIS — R1312 Dysphagia, oropharyngeal phase: Secondary | ICD-10-CM | POA: Diagnosis not present

## 2022-10-16 ENCOUNTER — Encounter: Payer: Self-pay | Admitting: Cardiovascular Disease

## 2022-10-16 ENCOUNTER — Ambulatory Visit: Payer: Medicare Other | Admitting: Cardiovascular Disease

## 2022-10-16 VITALS — BP 128/60 | HR 104 | Ht 69.0 in | Wt 146.0 lb

## 2022-10-16 DIAGNOSIS — D649 Anemia, unspecified: Secondary | ICD-10-CM | POA: Diagnosis not present

## 2022-10-16 DIAGNOSIS — I480 Paroxysmal atrial fibrillation: Secondary | ICD-10-CM | POA: Insufficient documentation

## 2022-10-16 DIAGNOSIS — Z950 Presence of cardiac pacemaker: Secondary | ICD-10-CM | POA: Diagnosis not present

## 2022-10-16 DIAGNOSIS — I495 Sick sinus syndrome: Secondary | ICD-10-CM | POA: Diagnosis not present

## 2022-10-16 DIAGNOSIS — I4729 Other ventricular tachycardia: Secondary | ICD-10-CM

## 2022-10-16 DIAGNOSIS — Z79899 Other long term (current) drug therapy: Secondary | ICD-10-CM | POA: Insufficient documentation

## 2022-10-16 NOTE — Patient Instructions (Signed)
Medication Instructions:  STOP Xarelto *If you need a refill on your cardiac medications before your next appointment, please call your pharmacy*   Lab Work: CBC, BMET- today If you have labs (blood work) drawn today and your tests are completely normal, you will receive your results only by: MyChart Message (if you have MyChart) OR A paper copy in the mail If you have any lab test that is abnormal or we need to change your treatment, we will call you to review the results.  Follow-Up: At [facility], you and your health needs are our priority.  As part of our continuing mission to provide you with exceptional heart care, we have created designated Provider Care Teams.  These Care Teams include your primary Cardiologist (physician) and Advanced Practice Providers (APPs -  Physician Assistants and Nurse Practitioners) who all work together to provide you with the care you need, when you need it.  We recommend signing up for the patient portal called "MyChart".  Sign up information is provided on this After Visit Summary.  MyChart is used to connect with patients for Virtual Visits (Telemedicine).  Patients are able to view lab/test results, encounter notes, upcoming appointments, etc.  Non-urgent messages can be sent to your provider as well.   To learn more about what you can do with MyChart, go to [email or web address].    Your next appointment:   1 year(s)  Provider:   Thurmon Fair, MD

## 2022-10-16 NOTE — Progress Notes (Signed)
Patient ID: Stephen Robbins, male   DOB: 04/25/1935, 87 y.o.   MRN: [id number]    Cardiology Office Note    Date:  10/20/2022   ID:  Stephen Robbins, DOB 1935-09-08, MRN [id number]  PCP:  Nadara Eaton, MD  Cardiologist:   Thurmon Fair, MD   Chief Complaint  Patient presents with   Atrial Fibrillation   Pacemaker Check    History of Present Illness:  Stephen Robbins is a 87 y.o. male with asymptomatic paroxysmal atrial fibrillation detected on pacemaker downloads, sinus node dysfunction with chronotropic incompetence, Parkinson's disease, diabetes mellitus, asymptomatic brief nonsustained VT detected on pacemaker.  Is now in the nursing side of the [facility] facility.  Seems to be doing reasonably well.  He is on midodrine for orthostatic hypotension.  He continues to be unsteady on his feet and occasionally falls, but he has not had any serious injuries.  He is dizzy especially when he changes position, but has not had syncope or palpitations.  He has not had any chest pain, dyspnea, orthopnea, PND, edema, new focal neurological complaints, claudication.  He was quite pale today and we rechecked his CBC.  This showed worsening anemia with a hemoglobin of 8.7, down from a baseline of 10.7 over the last 12 months.  Normocytic, mildly hypochromic pattern.  His device is a Programme researcher, broadcasting/film/video EL L121 (not MRI conditional) dual-chamber pacemaker implanted in 2015.   Device check in the office today shows normal function.  Estimated generator longevity is about 2 years.  Lead parameters are good.  He only has 25 to 30% atrial pacing and usually less than 5% ventricular pacing.  Over the years he has had a handful of episodes of paroxysmal atrial fibrillation, but the burden has been extremely low, under 1% and recently all of the recorded events have been less than 6 minutes in duration.  He has not had any meaningful episodes of high ventricular rates.  His ventricular sensing is had a  relatively high threshold since in the past years he had ventricular oversensing that could be reproduced with isometric chest muscle exercises.  He has excellent sensed R waves.  Heart rate histograms are blunted, consistent with his very sedentary lifestyle.  His most recent echocardiogram performed September 2023 showed normal left ventricular systolic function with EF 55 to 60% and no significant valvular abnormalities.  The left atrium was normal in size.  Past Medical History:  Diagnosis Date   Arthritis    "joints; a little" (01/04/2014)   Asymptomatic bilateral carotid artery stenosis    per duplex  05-15-2012  left >39%/   right 40-59%   Basal cell carcinoma    nose   Borderline diabetes    BPH (benign prostatic hypertrophy) with urinary obstruction    Bradycardia    Bronchial pneumonia 1958   Chronotropic incompetence with sinus node dysfunction    Compressed cervical disc    Compression of lumbar vertebra (HCC)    L4 -- L5   Dizzy    Dysmetabolic syndrome    Fatigue    Frequency of urination    GERD (gastroesophageal reflux disease)    occasional   Hemorrhoids    Hyperlipidemia    Hypothyroidism    Nocturia    NSVT (nonsustained ventricular tachycardia) [facility])    cardiologist-  dr Rossy Virag   Pacemaker    Urgency of urination    Wears glasses     Past Surgical History:  Procedure Laterality Date  BASAL CELL CARCINOMA EXCISION     nose   CLOSED REDUCTION NASAL FRACTURE  09-01-2007   CYSTOSCOPY N/A 12/28/2012   Procedure: CYSTOSCOPY;  Surgeon: Valetta Fuller, MD;  Location: [facility];  Service: Urology;  Laterality: N/A;   EXERCISE TOLERENCE TEST  12-03-2012  DR CROITURO   CHRONOTROPIC INCOMPETENCE/ NORMAL RESTING BP W/ APPROPRIATE RESPONSE/ NO CHEST PAIN/ NO ST CHANGES FROM BASELINE   INSERT / REPLACE / REMOVE PACEMAKER  01/04/2014   [employer] model L121 serial number [id number]   LACERATION REPAIR Right 1978   middle finger    NEUROPLASTY / TRANSPOSITION ULNAR NERVE AT ELBOW Left 02-09-2010   PERMANENT PACEMAKER INSERTION N/A 01/04/2014   Procedure: PERMANENT PACEMAKER INSERTION;  Surgeon: Thurmon Fair, MD;  Location: [facility]ATH [facility];  Service: Cardiovascular;  Laterality: N/A;   SKIN BIOPSY     scc   TONSILLECTOMY AND ADENOIDECTOMY  1944   TRANSURETHRAL INCISION OF PROSTATE N/A 12/28/2012   Procedure: TRANSURETHRAL INCISION OF THE PROSTATE (TUIP);  Surgeon: Valetta Fuller, MD;  Location[facility];  Service: Urology;  Laterality: N/A;   ULNAR NERVE TRANSPOSITION      Current Medications: Outpatient Medications Prior to Visit  Medication Sig Dispense Refill   ALPRAZolam (XANAX) 0.25 MG tablet Take 0.25 mg by mouth at bedtime as needed.     Blood Pressure KIT Take blood pressure twice weekly. 1 kit 0   buPROPion (WELLBUTRIN) 75 MG tablet TAKE 1 TABLET(75 MG) BY MOUTH TWICE DAILY (Patient taking differently: Take 75 mg by mouth 2 (two) times daily.) 180 tablet 1   carbidopa-levodopa (SINEMET IR) 25-100 MG tablet Take 2 tablets by mouth 3 (three) times daily. 540 tablet 0   Cobalamin Combinations (B12 FOLATE PO) Take 2 tablets by mouth daily at 12 noon.     GEMTESA 75 MG TABS Take 75 mg by mouth daily.     levothyroxine (SYNTHROID) 75 MCG tablet TAKE 1 TABLET(75 MCG) BY MOUTH DAILY BEFORE AND BREAKFAST (Patient taking differently: Take 75 mcg by mouth daily before breakfast.) 90 tablet 1   Melatonin 10 MG TABS Take 10 mg by mouth at bedtime.     midodrine (PROAMATINE) 2.5 MG tablet Take 1 tablet (2.5 mg total) by mouth 3 (three) times daily with meals. (Patient taking differently: Take 2.5 mg by mouth 2 (two) times daily with a meal.) 270 tablet 1   nitrofurantoin, macrocrystal-monohydrate, (MACROBID) 100 MG capsule Take 100 mg by mouth 2 (two) times daily. For seven days     psyllium (REGULOID) 0.52 g capsule Take 0.52 g by mouth daily.     saccharomyces boulardii (FLORASTOR) 250 MG capsule Take 250 mg  by mouth 2 (two) times daily. For seven days     simvastatin (ZOCOR) 40 MG tablet Take 1 tablet (40 mg total) by mouth at bedtime. 90 tablet 1   tamsulosin (FLOMAX) 0.4 MG CAPS capsule Take 0.4 mg by mouth at bedtime.     traZODone (DESYREL) 50 MG tablet Take 50 mg by mouth at bedtime.     rivaroxaban (XARELTO) 20 MG TABS tablet Take 1 tablet (20 mg total) by mouth daily with supper. NEEDS CARDIOLOGY APPOINTMENT, PLEASE CALL OFFICE 30 tablet 0   ACCU-CHEK FASTCLIX LANCETS MISC Check blood sugar three times daily (Patient not taking: Reported on 10/16/2022) 300 each 12   blood glucose meter kit and supplies Dispense based on patient and insurance preference. Check blood sugars daily and as needed Dx: e11.9 (  Patient not taking: Reported on 10/16/2022) 1 each 0   ibuprofen (ADVIL) 400 MG tablet Take 400 mg by mouth every 6 (six) hours as needed for mild pain. (Patient not taking: Reported on 10/16/2022)     loperamide (IMODIUM A-D) 2 MG tablet Take 4 mg by mouth daily. (Patient not taking: Reported on 10/16/2022)     metFORMIN (GLUCOPHAGE) 1000 MG tablet TAKE 1 TABLET(1000 MG) BY MOUTH TWICE DAILY WITH A MEAL (Patient not taking: Reported on 10/16/2022) 60 tablet 0   sertraline (ZOLOFT) 100 MG tablet Take 1 tablet (100 mg total) by mouth daily. (Patient not taking: Reported on 10/16/2022) 90 tablet 1   No facility-administered medications prior to visit.     Allergies:   Covid-19 mrna vacc (moderna) and Penicillins   Social History   Socioeconomic History   Marital status: Widowed    Spouse name: Not on file   Number of children: 0   Years of education: Not on file   Highest education level: Bachelor's degree (e.g., BA, AB, BS)  Occupational History   Occupation: retired    Associate Professor: RETIRED    Comment: executive   Tobacco Use   Smoking status: Former    Current packs/day: 0.00    Average packs/day: 1 pack/day for 10.0 years (10.0 ttl pk-yrs)    Types: Cigarettes    Start date: 03/25/1958     Quit date: 03/25/1968    Years since quitting: 54.6   Smokeless tobacco: Never  Vaping Use   Vaping status: Never Used  Substance and Sexual Activity   Alcohol use: Yes    Alcohol/week: 7.0 standard drinks of alcohol    Types: 7 Glasses of wine per week    Comment: 1 glass wine daily   Drug use: No   Sexual activity: Not Currently  Other Topics Concern   Not on file  Social History Narrative   Lives at [facility]   Left handed   Lives on 2nd level   Lost wife 01/21/2019, dementia      Social Determinants of Health   Financial Resource Strain: Not on file  Food Insecurity: Not on file  Transportation Needs: Not on file  Physical Activity: Not on file  Stress: Not on file  Social Connections: Not on file     Family History:  The patient's family history includes Heart Problems in his brother; Lung cancer in his mother; Parkinson's disease in his brother; Stroke in his father.   ROS:   Please see the history of present illness.    Review of Systems  Skin:  Suspicious lesions: .mcrpexn.   All other systems are reviewed and are negative.   PHYSICAL EXAM:   VS:  BP 128/60 (BP Location: Left Arm, Patient Position: Sitting, Cuff Size: Normal)   Pulse (!) 104   Ht 5\' 9"  (1.753 m)   Wt 146 lb (66.2 kg)   SpO2 97%   BMI 21.56 kg/m     General: Alert, oriented x3, no distress, appears elderly and rather frail; healthy left subclavian pacemaker site Head: Right eye stye, no evidence of trauma, PERRL, EOMI, no exophtalmos or lid lag, no myxedema, no xanthelasma; normal ears, nose and oropharynx Neck: normal jugular venous pulsations and no hepatojugular reflux; brisk carotid pulses without delay and no carotid bruits Chest: clear to auscultation, no signs of consolidation by percussion or palpation, normal fremitus, symmetrical and full respiratory excursions Cardiovascular: normal position and quality of the apical impulse, regular rhythm, normal first  and  widely split second heart sounds, no murmurs, rubs or gallops Abdomen: no tenderness or distention, no masses by palpation, no abnormal pulsatility or arterial bruits, normal bowel sounds, no hepatosplenomegaly Extremities: no clubbing, cyanosis or edema; 2+ radial, ulnar and brachial pulses bilaterally; 2+ right femoral, posterior tibial and dorsalis pedis pulses; 2+ left femoral, posterior tibial and dorsalis pedis pulses; no subclavian or femoral bruits Neurological: grossly nonfocal Psych: Normal mood and affect   Wt Readings from Last 3 Encounters:  10/16/22 146 lb (66.2 kg)  05/06/22 133 lb 6.4 oz (60.5 kg)  05/03/22 137 lb 6.4 oz (62.3 kg)      Studies/Labs Reviewed:   EKG:  EKG is ordered today.  It showed AV sequential pacing at about 104 bpm with ventricular pseudofusion.  He has a chronic RBBB plus LAFB.  Just a few minutes later his heart was down to 60 bpm.  Recent Labs: 12/17/2021: ALT 11; Magnesium 1.6 10/16/2022: BUN 17; Creatinine, Ser 0.78; Hemoglobin 8.7; Platelets 262; Potassium 4.6; Sodium 140   Lipid Panel    Component Value Date/Time   CHOL 202 (H) 10/17/2020 1456   TRIG 146.0 10/17/2020 1456   TRIG 83 02/20/2006 0838   HDL 39.50 10/17/2020 1456   CHOLHDL 5 10/17/2020 1456   VLDL 29.2 10/17/2020 1456   LDLCALC 134 (H) 10/17/2020 1456   LDLCALC 87 12/07/2019 1520     ASSESSMENT:    1. Paroxysmal atrial fibrillation (HCC)   2. SSS (sick sinus syndrome) (HCC)   3. Anemia, unspecified type   4. Medication management   5. Pacemaker      PLAN:  In order of problems listed above:  Atrial fibrillation: Very low prevalence, well rate controlled, asymptomatic.  On anticoagulation, with a history of falls and increasingly severe anemia. CHADSVasc score 3 (age, DM).  Antiarrhythmics are not justified and I think we need to discontinue the anticoagulants as well.  The risk of serious injury/bleeding/worsening anemia exceeds the risk of stroke. SSS: Quite  sedentary.  Heart rate histograms are appropriate. PPM: Continue remote downloads every 3 months and yearly office visits.  Ventricular lead sensing changed to 4 mV today, no further episodes of ventricular oversensing.   Orthostatic hypotension: With increased risk of falls.  Related to peripheral neuropathy, Parkinson's disease. On midodrine. Anemia: Results came back after he had left the office.  Reinforced the decision to stop his Xarelto.  Will need to be evaluated for iron deficiency and treated accordingly.   Medication Adjustments/Labs and Tests Ordered: Current medicines are reviewed at length with the patient today.  Concerns regarding medicines are outlined above.  Medication changes, Labs and Tests ordered today are listed in the Patient Instructions below. Patient Instructions  Medication Instructions:  STOP Xarelto *If you need a refill on your cardiac medications before your next appointment, please call your pharmacy*   Lab Work: CBC, BMET- today If you have labs (blood work) drawn today and your tests are completely normal, you will receive your results only by: MyChart Message (if you have MyChart) OR A paper copy in the mail If you have any lab test that is abnormal or we need to change your treatment, we will call you to review the results.  Follow-Up: At [facility], you and your health needs are our priority.  As part of our continuing mission to provide you with exceptional heart care, we have created designated Provider Care Teams.  These Care Teams include your primary Cardiologist (physician) and Advanced  Practice Providers (APPs -  Physician Assistants and Nurse Practitioners) who all work together to provide you with the care you need, when you need it.  We recommend signing up for the patient portal called "MyChart".  Sign up information is provided on this After Visit Summary.  MyChart is used to connect with patients for Virtual Visits (Telemedicine).   Patients are able to view lab/test results, encounter notes, upcoming appointments, etc.  Non-urgent messages can be sent to your provider as well.   To learn more about what you can do with MyChart, go to [email or web address].    Your next appointment:   1 year(s)  Provider:   Thurmon Fair, MD       Signed, Thurmon Fair, MD  10/20/2022 2:57 PM    [facility] [address] [place], [place], [place]  [address] Phone: [phone]; Fax: [phone]

## 2022-10-17 DIAGNOSIS — H04123 Dry eye syndrome of bilateral lacrimal glands: Secondary | ICD-10-CM | POA: Diagnosis not present

## 2022-10-17 DIAGNOSIS — H5703 Miosis: Secondary | ICD-10-CM | POA: Diagnosis not present

## 2022-10-17 DIAGNOSIS — R1312 Dysphagia, oropharyngeal phase: Secondary | ICD-10-CM | POA: Diagnosis not present

## 2022-10-17 DIAGNOSIS — H40003 Preglaucoma, unspecified, bilateral: Secondary | ICD-10-CM | POA: Diagnosis not present

## 2022-10-17 DIAGNOSIS — H18523 Epithelial (juvenile) corneal dystrophy, bilateral: Secondary | ICD-10-CM | POA: Diagnosis not present

## 2022-10-17 DIAGNOSIS — G20B2 Parkinson's disease with dyskinesia, with fluctuations: Secondary | ICD-10-CM | POA: Diagnosis not present

## 2022-10-17 DIAGNOSIS — E119 Type 2 diabetes mellitus without complications: Secondary | ICD-10-CM | POA: Diagnosis not present

## 2022-10-17 DIAGNOSIS — G20A1 Parkinson's disease without dyskinesia, without mention of fluctuations: Secondary | ICD-10-CM | POA: Diagnosis not present

## 2022-10-17 DIAGNOSIS — Z961 Presence of intraocular lens: Secondary | ICD-10-CM | POA: Diagnosis not present

## 2022-10-17 LAB — CBC
Hematocrit: 27.8 % — ABNORMAL LOW (ref 37.5–51.0)
Hemoglobin: 8.7 g/dL — ABNORMAL LOW (ref 13.0–17.7)
MCH: 27.9 pg (ref 26.6–33.0)
MCHC: 31.3 g/dL — ABNORMAL LOW (ref 31.5–35.7)
MCV: 89 fL (ref 79–97)
Platelets: 262 10*3/uL (ref 150–450)
RBC: 3.12 x10E6/uL — ABNORMAL LOW (ref 4.14–5.80)
RDW: 13.9 % (ref 11.6–15.4)
WBC: 7.7 10*3/uL (ref 3.4–10.8)

## 2022-10-17 LAB — BASIC METABOLIC PANEL
BUN/Creatinine Ratio: 22 (ref 10–24)
BUN: 17 mg/dL (ref 8–27)
CO2: 22 mmol/L (ref 20–29)
Calcium: 9.5 mg/dL (ref 8.6–10.2)
Creatinine, Ser: 0.78 mg/dL (ref 0.76–1.27)
Glucose: 209 mg/dL — ABNORMAL HIGH (ref 70–99)
Potassium: 4.6 mmol/L (ref 3.5–5.2)
Sodium: 140 mmol/L (ref 134–144)
eGFR: 87 mL/min/{1.73_m2} (ref >59)

## 2022-10-22 DIAGNOSIS — R1312 Dysphagia, oropharyngeal phase: Secondary | ICD-10-CM | POA: Diagnosis not present

## 2022-10-22 DIAGNOSIS — G20B2 Parkinson's disease with dyskinesia, with fluctuations: Secondary | ICD-10-CM | POA: Diagnosis not present

## 2022-10-24 DIAGNOSIS — R1312 Dysphagia, oropharyngeal phase: Secondary | ICD-10-CM | POA: Diagnosis not present

## 2022-10-24 DIAGNOSIS — G20B2 Parkinson's disease with dyskinesia, with fluctuations: Secondary | ICD-10-CM | POA: Diagnosis not present

## 2022-10-29 DIAGNOSIS — R1312 Dysphagia, oropharyngeal phase: Secondary | ICD-10-CM | POA: Diagnosis not present

## 2022-10-29 DIAGNOSIS — G20B2 Parkinson's disease with dyskinesia, with fluctuations: Secondary | ICD-10-CM | POA: Diagnosis not present

## 2022-10-30 DIAGNOSIS — F5101 Primary insomnia: Secondary | ICD-10-CM | POA: Diagnosis not present

## 2022-10-30 DIAGNOSIS — F411 Generalized anxiety disorder: Secondary | ICD-10-CM | POA: Diagnosis not present

## 2022-10-30 DIAGNOSIS — G3184 Mild cognitive impairment, so stated: Secondary | ICD-10-CM | POA: Diagnosis not present

## 2022-10-30 DIAGNOSIS — F331 Major depressive disorder, recurrent, moderate: Secondary | ICD-10-CM | POA: Diagnosis not present

## 2022-11-05 DIAGNOSIS — G20B2 Parkinson's disease with dyskinesia, with fluctuations: Secondary | ICD-10-CM | POA: Diagnosis not present

## 2022-11-05 DIAGNOSIS — R1312 Dysphagia, oropharyngeal phase: Secondary | ICD-10-CM | POA: Diagnosis not present

## 2022-11-07 ENCOUNTER — Ambulatory Visit: Payer: Medicare Other | Admitting: Neurology

## 2022-11-12 DIAGNOSIS — G20B2 Parkinson's disease with dyskinesia, with fluctuations: Secondary | ICD-10-CM | POA: Diagnosis not present

## 2022-11-12 DIAGNOSIS — R1312 Dysphagia, oropharyngeal phase: Secondary | ICD-10-CM | POA: Diagnosis not present

## 2022-11-14 DIAGNOSIS — R1312 Dysphagia, oropharyngeal phase: Secondary | ICD-10-CM | POA: Diagnosis not present

## 2022-11-14 DIAGNOSIS — G20B2 Parkinson's disease with dyskinesia, with fluctuations: Secondary | ICD-10-CM | POA: Diagnosis not present

## 2022-11-19 DIAGNOSIS — G20B2 Parkinson's disease with dyskinesia, with fluctuations: Secondary | ICD-10-CM | POA: Diagnosis not present

## 2022-11-19 DIAGNOSIS — R1312 Dysphagia, oropharyngeal phase: Secondary | ICD-10-CM | POA: Diagnosis not present

## 2022-11-21 DIAGNOSIS — R1312 Dysphagia, oropharyngeal phase: Secondary | ICD-10-CM | POA: Diagnosis not present

## 2022-11-21 DIAGNOSIS — G20B2 Parkinson's disease with dyskinesia, with fluctuations: Secondary | ICD-10-CM | POA: Diagnosis not present

## 2022-12-18 ENCOUNTER — Ambulatory Visit: Payer: Medicare Other

## 2022-12-18 DIAGNOSIS — F03B3 Unspecified dementia, moderate, with mood disturbance: Secondary | ICD-10-CM | POA: Diagnosis not present

## 2022-12-18 DIAGNOSIS — F411 Generalized anxiety disorder: Secondary | ICD-10-CM | POA: Diagnosis not present

## 2022-12-18 DIAGNOSIS — I495 Sick sinus syndrome: Secondary | ICD-10-CM | POA: Diagnosis not present

## 2022-12-18 DIAGNOSIS — F331 Major depressive disorder, recurrent, moderate: Secondary | ICD-10-CM | POA: Diagnosis not present

## 2022-12-18 DIAGNOSIS — E559 Vitamin D deficiency, unspecified: Secondary | ICD-10-CM | POA: Diagnosis not present

## 2022-12-18 DIAGNOSIS — F5101 Primary insomnia: Secondary | ICD-10-CM | POA: Diagnosis not present

## 2022-12-18 NOTE — Progress Notes (Signed)
Assessment/Plan:   1.  Parkinsons Disease  -continue carbidopa/levodopa 25/100, 2 tablets 3 times per day.  We discussed timing again with protein and changed timing with [name].  Also discussed not to take it with his protein supplement shakes.  -Asked nursing facility to send [place] times that they are actually giving him medication.  -Encouraged exercise, both physical and mental.  He actually looked pretty good today, but was frustrated over the fact that he cannot live independently.  2.  Depression  -On sertraline, 100 mg daily.    -pcp managing  3.  Weight loss  -He reports he is doing better with protein intake  4.  Visual hallucinations  -Resolved after treatment for UTI and currently without any visual hallucinations.  5.  Neurogenic Orthostatic Hypotension  -I wanted him on midodrine, 2.5 mg 3 times per day with meals but SNF changed it back to bid prn.  I don't think that they are likely checking his BP each time they are distributing meds and wonder what time the 2nd dose is given (should be with meals).  The caregiver with him today is unsure why medications were changed, and we will leave to primary to manage.  We did request them sending [place] times that they are actually giving him the medication.  Subjective:   Stephen Robbins was seen today in follow up for Parkinsons disease.  My previous records were reviewed prior to todays visit as well as outside records available to me. Pt with caregiver who supplements hx.   last visit, patient's blood pressure remained very low and cardiology was previously managing.  However, they had told him to take his midodrine as needed and this was just not happening realistically in a SNF setting.  We ended up just scheduling the midodrine so that he got a 2.5 mg 3 times per day with meals.  It was very low, but I told them to call me should he have any issues with it.  They didn't but they changed it so he is taking bid but only prn  again.  When he followed up with cardiology in July, his blood pressure was 128/60.  Separately, cardiology notes that patient was anemic (hemoglobin 8.7 at end of July) and because of his history of falls as well, it was decided to stop the Xarelto.    Pt denies hallucinations.  No falls.    Current movement disorder medications: Carbidopa/levodopa 25/100, 2 tablets 3 times per day (states taking breakfast, lunch dinner) Gabapentin, 300 mg twice per day Sertraline, 100 mg daily   ALLERGIES:   Allergies  Allergen Reactions   Covid-19 Mrna Vacc (Moderna)     "Couldn't move". No rash, no itching    Penicillins Other (See Comments)    Unknown childhood reaction    CURRENT MEDICATIONS:  Outpatient Encounter Medications as of 12/26/2022  Medication Sig   Blood Pressure KIT Take blood pressure twice weekly.   carbidopa-levodopa (SINEMET IR) 25-100 MG tablet Take 2 tablets by mouth 3 (three) times daily.   Cobalamin Combinations (B12 FOLATE PO) Take 2 tablets by mouth daily at 12 noon.   GEMTESA 75 MG TABS Take 75 mg by mouth daily.   ibuprofen (ADVIL) 400 MG tablet Take 400 mg by mouth every 6 (six) hours as needed.   levothyroxine (SYNTHROID) 75 MCG tablet TAKE 1 TABLET(75 MCG) BY MOUTH DAILY BEFORE AND BREAKFAST (Patient taking differently: Take 75 mcg by mouth daily before breakfast.)   Melatonin  10 MG TABS Take 10 mg by mouth at bedtime.   metFORMIN (GLUCOPHAGE) 1000 MG tablet TAKE 1 TABLET(1000 MG) BY MOUTH TWICE DAILY WITH A MEAL   midodrine (PROAMATINE) 2.5 MG tablet Take 1 tablet (2.5 mg total) by mouth 3 (three) times daily with meals. (Patient taking differently: Take 2.5 mg by mouth 2 (two) times daily with a meal.)   mirtazapine (REMERON) 15 MG tablet Take 15 mg by mouth at bedtime.   psyllium (REGULOID) 0.52 g capsule Take 0.52 g by mouth daily.   saccharomyces boulardii (FLORASTOR) 250 MG capsule Take 250 mg by mouth 2 (two) times daily. For seven days   sertraline (ZOLOFT)  100 MG tablet Take 1 tablet (100 mg total) by mouth daily.   tamsulosin (FLOMAX) 0.4 MG CAPS capsule Take 0.4 mg by mouth at bedtime.   traZODone (DESYREL) 50 MG tablet Take 50 mg by mouth at bedtime.   ALPRAZolam (XANAX) 0.25 MG tablet Take 0.25 mg by mouth at bedtime as needed.   buPROPion (WELLBUTRIN) 75 MG tablet TAKE 1 TABLET(75 MG) BY MOUTH TWICE DAILY (Patient not taking: Reported on 12/26/2022)   nitrofurantoin, macrocrystal-monohydrate, (MACROBID) 100 MG capsule Take 100 mg by mouth 2 (two) times daily. For seven days (Patient not taking: Reported on 12/26/2022)   simvastatin (ZOCOR) 40 MG tablet Take 1 tablet (40 mg total) by mouth at bedtime.   [DISCONTINUED] ACCU-CHEK FASTCLIX LANCETS MISC Check blood sugar three times daily (Patient not taking: Reported on 10/16/2022)   [DISCONTINUED] blood glucose meter kit and supplies Dispense based on patient and insurance preference. Check blood sugars daily and as needed Dx: e11.9 (Patient not taking: Reported on 10/16/2022)   [DISCONTINUED] ibuprofen (ADVIL) 400 MG tablet Take 400 mg by mouth every 6 (six) hours as needed for mild pain. (Patient not taking: Reported on 10/16/2022)   [DISCONTINUED] loperamide (IMODIUM A-D) 2 MG tablet Take 4 mg by mouth daily. (Patient not taking: Reported on 10/16/2022)   No facility-administered encounter medications on file as of 12/26/2022.    Objective:   PHYSICAL EXAMINATION:    VITALS:   Vitals:   12/26/22 1430  BP: 116/66  Pulse: 71  SpO2: 97%  Weight: 145 lb (65.8 kg)  Height: 5\' 9"  (1.753 m)      Wt Readings from Last 3 Encounters:  12/26/22 145 lb (65.8 kg)  10/16/22 146 lb (66.2 kg)  05/06/22 133 lb 6.4 oz (60.5 kg)     GEN:  The patient appears stated age and is in NAD.  HEENT:  Normocephalic, atraumatic.   The mucous membranes are moist. The superficial temporal arteries are without ropiness or tenderness. CV:  RRR Lungs:  CTAB Neck/HEME:  There are no carotid bruits  bilaterally.  Neurological examination:  Orientation: The patient is alert and oriented x3.  He asks appropriate questions and recognizes this examiner.  He actually asks appropriate questions about this examiner's children and remembers great details about them. Cranial nerves: There is good facial symmetry with facial hypomimia. The speech is fluent and clear but hypophonic. Soft palate rises symmetrically and there is no tongue deviation. Hearing is intact to conversational tone. Sensation: Sensation is intact to light touch throughout Motor: Strength is at least antigravity x4.  Movement examination: Tone: There is nl tone in the UE/LE Abnormal movements: No dyskinesia or tremor today. Coordination:  There is no decremation with RAM's Gait and Station: Patient pushes off of the wheelchair to arise.  He is given mild assistance.  This  is similar to previous.  He is given a walker.  He is slow and mildly unsteady, but otherwise does fairly well.  I have reviewed and interpreted the following labs independently    Chemistry      Component Value Date/Time   NA 140 10/16/2022 1507   K 4.6 10/16/2022 1507   CL 102 10/16/2022 1507   CO2 22 10/16/2022 1507   BUN 17 10/16/2022 1507   CREATININE 0.78 10/16/2022 1507   CREATININE 0.93 12/07/2019 1520      Component Value Date/Time   CALCIUM 9.5 10/16/2022 1507   ALKPHOS 75 12/17/2021 1529   AST 20 12/17/2021 1529   ALT 11 12/17/2021 1529   BILITOT 0.4 12/17/2021 1529       Lab Results  Component Value Date   WBC 7.7 10/16/2022   HGB 8.7 (L) 10/16/2022   HCT 27.8 (L) 10/16/2022   MCV 89 10/16/2022   PLT 262 10/16/2022    Lab Results  Component Value Date   TSH 3.879 11/21/2020     Total time spent on today's visit was 31 minutes, including both face-to-face time and nonface-to-face time.  Time included that spent on review of records (prior notes available to me/labs/imaging if pertinent), discussing treatment and goals,  answering patient's questions and coordinating care.  Cc:  Karna Dupes, MD

## 2022-12-20 LAB — CUP PACEART REMOTE DEVICE CHECK
Battery Remaining Longevity: 12 mo
Battery Remaining Percentage: 13 %
Brady Statistic RA Percent Paced: 44 %
Brady Statistic RV Percent Paced: 13 %
Date Time Interrogation Session: 20240926125300
Implantable Lead Connection Status: 753985
Implantable Lead Connection Status: 753985
Implantable Lead Implant Date: 20151013
Implantable Lead Implant Date: 20151013
Implantable Lead Location: 753859
Implantable Lead Location: 753860
Implantable Lead Model: 4135
Implantable Lead Model: 4136
Implantable Lead Serial Number: 29480373
Implantable Lead Serial Number: 29608302
Implantable Pulse Generator Implant Date: 20151013
Lead Channel Impedance Value: 543 Ohm
Lead Channel Impedance Value: 841 Ohm
Lead Channel Setting Pacing Amplitude: 2 V
Lead Channel Setting Pacing Amplitude: 5 V
Lead Channel Setting Pacing Pulse Width: 1.5 ms
Lead Channel Setting Sensing Sensitivity: 4 mV
Pulse Gen Serial Number: 702951
Zone Setting Status: 755011

## 2022-12-23 DIAGNOSIS — M6281 Muscle weakness (generalized): Secondary | ICD-10-CM | POA: Diagnosis not present

## 2022-12-24 DIAGNOSIS — I9589 Other hypotension: Secondary | ICD-10-CM | POA: Diagnosis not present

## 2022-12-24 DIAGNOSIS — E039 Hypothyroidism, unspecified: Secondary | ICD-10-CM | POA: Diagnosis not present

## 2022-12-24 DIAGNOSIS — E119 Type 2 diabetes mellitus without complications: Secondary | ICD-10-CM | POA: Diagnosis not present

## 2022-12-24 DIAGNOSIS — Z7984 Long term (current) use of oral hypoglycemic drugs: Secondary | ICD-10-CM | POA: Diagnosis not present

## 2022-12-24 DIAGNOSIS — N400 Enlarged prostate without lower urinary tract symptoms: Secondary | ICD-10-CM | POA: Diagnosis not present

## 2022-12-24 DIAGNOSIS — G20A1 Parkinson's disease without dyskinesia, without mention of fluctuations: Secondary | ICD-10-CM | POA: Diagnosis not present

## 2022-12-24 DIAGNOSIS — E785 Hyperlipidemia, unspecified: Secondary | ICD-10-CM | POA: Diagnosis not present

## 2022-12-24 DIAGNOSIS — F0280 Dementia in other diseases classified elsewhere without behavioral disturbance: Secondary | ICD-10-CM | POA: Diagnosis not present

## 2022-12-26 ENCOUNTER — Encounter: Payer: Self-pay | Admitting: Neurology

## 2022-12-26 ENCOUNTER — Ambulatory Visit: Payer: Medicare Other | Admitting: Neurology

## 2022-12-26 VITALS — BP 116/66 | HR 71 | Ht 69.0 in | Wt 145.0 lb

## 2022-12-26 DIAGNOSIS — G20B2 Parkinson's disease with dyskinesia, with fluctuations: Secondary | ICD-10-CM | POA: Diagnosis not present

## 2023-01-01 DIAGNOSIS — F03B3 Unspecified dementia, moderate, with mood disturbance: Secondary | ICD-10-CM | POA: Diagnosis not present

## 2023-01-01 DIAGNOSIS — F411 Generalized anxiety disorder: Secondary | ICD-10-CM | POA: Diagnosis not present

## 2023-01-01 DIAGNOSIS — F5101 Primary insomnia: Secondary | ICD-10-CM | POA: Diagnosis not present

## 2023-01-01 DIAGNOSIS — F331 Major depressive disorder, recurrent, moderate: Secondary | ICD-10-CM | POA: Diagnosis not present

## 2023-01-03 NOTE — Progress Notes (Signed)
Remote pacemaker transmission.   

## 2023-01-15 DIAGNOSIS — F411 Generalized anxiety disorder: Secondary | ICD-10-CM | POA: Diagnosis not present

## 2023-01-15 DIAGNOSIS — F5101 Primary insomnia: Secondary | ICD-10-CM | POA: Diagnosis not present

## 2023-01-15 DIAGNOSIS — F331 Major depressive disorder, recurrent, moderate: Secondary | ICD-10-CM | POA: Diagnosis not present

## 2023-01-15 DIAGNOSIS — F03B3 Unspecified dementia, moderate, with mood disturbance: Secondary | ICD-10-CM | POA: Diagnosis not present

## 2023-01-22 DIAGNOSIS — E1142 Type 2 diabetes mellitus with diabetic polyneuropathy: Secondary | ICD-10-CM | POA: Diagnosis not present

## 2023-01-28 DIAGNOSIS — R41841 Cognitive communication deficit: Secondary | ICD-10-CM | POA: Diagnosis not present

## 2023-01-28 DIAGNOSIS — M6281 Muscle weakness (generalized): Secondary | ICD-10-CM | POA: Diagnosis not present

## 2023-01-28 DIAGNOSIS — R2681 Unsteadiness on feet: Secondary | ICD-10-CM | POA: Diagnosis not present

## 2023-01-28 DIAGNOSIS — M25551 Pain in right hip: Secondary | ICD-10-CM | POA: Diagnosis not present

## 2023-01-28 DIAGNOSIS — R499 Unspecified voice and resonance disorder: Secondary | ICD-10-CM | POA: Diagnosis not present

## 2023-01-28 DIAGNOSIS — R2689 Other abnormalities of gait and mobility: Secondary | ICD-10-CM | POA: Diagnosis not present

## 2023-01-28 DIAGNOSIS — R278 Other lack of coordination: Secondary | ICD-10-CM | POA: Diagnosis not present

## 2023-01-28 DIAGNOSIS — R1312 Dysphagia, oropharyngeal phase: Secondary | ICD-10-CM | POA: Diagnosis not present

## 2023-01-30 DIAGNOSIS — M6281 Muscle weakness (generalized): Secondary | ICD-10-CM | POA: Diagnosis not present

## 2023-01-30 DIAGNOSIS — M25551 Pain in right hip: Secondary | ICD-10-CM | POA: Diagnosis not present

## 2023-01-30 DIAGNOSIS — R278 Other lack of coordination: Secondary | ICD-10-CM | POA: Diagnosis not present

## 2023-01-30 DIAGNOSIS — R2681 Unsteadiness on feet: Secondary | ICD-10-CM | POA: Diagnosis not present

## 2023-01-30 DIAGNOSIS — R2689 Other abnormalities of gait and mobility: Secondary | ICD-10-CM | POA: Diagnosis not present

## 2023-01-30 DIAGNOSIS — R41841 Cognitive communication deficit: Secondary | ICD-10-CM | POA: Diagnosis not present

## 2023-02-03 DIAGNOSIS — R269 Unspecified abnormalities of gait and mobility: Secondary | ICD-10-CM | POA: Diagnosis not present

## 2023-02-03 DIAGNOSIS — E119 Type 2 diabetes mellitus without complications: Secondary | ICD-10-CM | POA: Diagnosis not present

## 2023-02-03 DIAGNOSIS — E039 Hypothyroidism, unspecified: Secondary | ICD-10-CM | POA: Diagnosis not present

## 2023-02-03 DIAGNOSIS — I480 Paroxysmal atrial fibrillation: Secondary | ICD-10-CM | POA: Diagnosis not present

## 2023-02-03 DIAGNOSIS — I951 Orthostatic hypotension: Secondary | ICD-10-CM | POA: Diagnosis not present

## 2023-02-03 DIAGNOSIS — F39 Unspecified mood [affective] disorder: Secondary | ICD-10-CM | POA: Diagnosis not present

## 2023-02-03 DIAGNOSIS — Z7984 Long term (current) use of oral hypoglycemic drugs: Secondary | ICD-10-CM | POA: Diagnosis not present

## 2023-02-03 DIAGNOSIS — G20A1 Parkinson's disease without dyskinesia, without mention of fluctuations: Secondary | ICD-10-CM | POA: Diagnosis not present

## 2023-02-04 DIAGNOSIS — R278 Other lack of coordination: Secondary | ICD-10-CM | POA: Diagnosis not present

## 2023-02-04 DIAGNOSIS — R2689 Other abnormalities of gait and mobility: Secondary | ICD-10-CM | POA: Diagnosis not present

## 2023-02-04 DIAGNOSIS — R2681 Unsteadiness on feet: Secondary | ICD-10-CM | POA: Diagnosis not present

## 2023-02-04 DIAGNOSIS — M25551 Pain in right hip: Secondary | ICD-10-CM | POA: Diagnosis not present

## 2023-02-04 DIAGNOSIS — E785 Hyperlipidemia, unspecified: Secondary | ICD-10-CM | POA: Diagnosis not present

## 2023-02-04 DIAGNOSIS — R41841 Cognitive communication deficit: Secondary | ICD-10-CM | POA: Diagnosis not present

## 2023-02-04 DIAGNOSIS — M6281 Muscle weakness (generalized): Secondary | ICD-10-CM | POA: Diagnosis not present

## 2023-02-04 DIAGNOSIS — E039 Hypothyroidism, unspecified: Secondary | ICD-10-CM | POA: Diagnosis not present

## 2023-02-05 DIAGNOSIS — F331 Major depressive disorder, recurrent, moderate: Secondary | ICD-10-CM | POA: Diagnosis not present

## 2023-02-05 DIAGNOSIS — F5101 Primary insomnia: Secondary | ICD-10-CM | POA: Diagnosis not present

## 2023-02-05 DIAGNOSIS — F03B3 Unspecified dementia, moderate, with mood disturbance: Secondary | ICD-10-CM | POA: Diagnosis not present

## 2023-02-05 DIAGNOSIS — F411 Generalized anxiety disorder: Secondary | ICD-10-CM | POA: Diagnosis not present

## 2023-02-05 DIAGNOSIS — R2689 Other abnormalities of gait and mobility: Secondary | ICD-10-CM | POA: Diagnosis not present

## 2023-02-06 DIAGNOSIS — R278 Other lack of coordination: Secondary | ICD-10-CM | POA: Diagnosis not present

## 2023-02-06 DIAGNOSIS — M6281 Muscle weakness (generalized): Secondary | ICD-10-CM | POA: Diagnosis not present

## 2023-02-06 DIAGNOSIS — R41841 Cognitive communication deficit: Secondary | ICD-10-CM | POA: Diagnosis not present

## 2023-02-06 DIAGNOSIS — M25551 Pain in right hip: Secondary | ICD-10-CM | POA: Diagnosis not present

## 2023-02-06 DIAGNOSIS — R2689 Other abnormalities of gait and mobility: Secondary | ICD-10-CM | POA: Diagnosis not present

## 2023-02-06 DIAGNOSIS — R2681 Unsteadiness on feet: Secondary | ICD-10-CM | POA: Diagnosis not present

## 2023-02-11 DIAGNOSIS — R2681 Unsteadiness on feet: Secondary | ICD-10-CM | POA: Diagnosis not present

## 2023-02-11 DIAGNOSIS — M6281 Muscle weakness (generalized): Secondary | ICD-10-CM | POA: Diagnosis not present

## 2023-02-11 DIAGNOSIS — M25551 Pain in right hip: Secondary | ICD-10-CM | POA: Diagnosis not present

## 2023-02-11 DIAGNOSIS — R2689 Other abnormalities of gait and mobility: Secondary | ICD-10-CM | POA: Diagnosis not present

## 2023-02-11 DIAGNOSIS — R278 Other lack of coordination: Secondary | ICD-10-CM | POA: Diagnosis not present

## 2023-02-11 DIAGNOSIS — R41841 Cognitive communication deficit: Secondary | ICD-10-CM | POA: Diagnosis not present

## 2023-02-13 DIAGNOSIS — R41841 Cognitive communication deficit: Secondary | ICD-10-CM | POA: Diagnosis not present

## 2023-02-13 DIAGNOSIS — R2681 Unsteadiness on feet: Secondary | ICD-10-CM | POA: Diagnosis not present

## 2023-02-13 DIAGNOSIS — M25551 Pain in right hip: Secondary | ICD-10-CM | POA: Diagnosis not present

## 2023-02-13 DIAGNOSIS — M6281 Muscle weakness (generalized): Secondary | ICD-10-CM | POA: Diagnosis not present

## 2023-02-13 DIAGNOSIS — R278 Other lack of coordination: Secondary | ICD-10-CM | POA: Diagnosis not present

## 2023-02-13 DIAGNOSIS — R2689 Other abnormalities of gait and mobility: Secondary | ICD-10-CM | POA: Diagnosis not present

## 2023-02-14 DIAGNOSIS — N3281 Overactive bladder: Secondary | ICD-10-CM | POA: Diagnosis not present

## 2023-02-14 DIAGNOSIS — E785 Hyperlipidemia, unspecified: Secondary | ICD-10-CM | POA: Diagnosis not present

## 2023-02-14 DIAGNOSIS — F39 Unspecified mood [affective] disorder: Secondary | ICD-10-CM | POA: Diagnosis not present

## 2023-02-14 DIAGNOSIS — I959 Hypotension, unspecified: Secondary | ICD-10-CM | POA: Diagnosis not present

## 2023-02-14 DIAGNOSIS — E1142 Type 2 diabetes mellitus with diabetic polyneuropathy: Secondary | ICD-10-CM | POA: Diagnosis not present

## 2023-02-14 DIAGNOSIS — E039 Hypothyroidism, unspecified: Secondary | ICD-10-CM | POA: Diagnosis not present

## 2023-02-14 DIAGNOSIS — N400 Enlarged prostate without lower urinary tract symptoms: Secondary | ICD-10-CM | POA: Diagnosis not present

## 2023-02-14 DIAGNOSIS — Z7984 Long term (current) use of oral hypoglycemic drugs: Secondary | ICD-10-CM | POA: Diagnosis not present

## 2023-02-14 DIAGNOSIS — F0280 Dementia in other diseases classified elsewhere without behavioral disturbance: Secondary | ICD-10-CM | POA: Diagnosis not present

## 2023-02-14 DIAGNOSIS — G20A1 Parkinson's disease without dyskinesia, without mention of fluctuations: Secondary | ICD-10-CM | POA: Diagnosis not present

## 2023-02-18 DIAGNOSIS — R2681 Unsteadiness on feet: Secondary | ICD-10-CM | POA: Diagnosis not present

## 2023-02-18 DIAGNOSIS — R41841 Cognitive communication deficit: Secondary | ICD-10-CM | POA: Diagnosis not present

## 2023-02-18 DIAGNOSIS — M6281 Muscle weakness (generalized): Secondary | ICD-10-CM | POA: Diagnosis not present

## 2023-02-18 DIAGNOSIS — R278 Other lack of coordination: Secondary | ICD-10-CM | POA: Diagnosis not present

## 2023-02-18 DIAGNOSIS — M25551 Pain in right hip: Secondary | ICD-10-CM | POA: Diagnosis not present

## 2023-02-18 DIAGNOSIS — R2689 Other abnormalities of gait and mobility: Secondary | ICD-10-CM | POA: Diagnosis not present

## 2023-02-25 DIAGNOSIS — R109 Unspecified abdominal pain: Secondary | ICD-10-CM | POA: Diagnosis not present

## 2023-02-25 DIAGNOSIS — I10 Essential (primary) hypertension: Secondary | ICD-10-CM | POA: Diagnosis not present

## 2023-02-25 DIAGNOSIS — R4189 Other symptoms and signs involving cognitive functions and awareness: Secondary | ICD-10-CM | POA: Diagnosis not present

## 2023-02-25 DIAGNOSIS — R0989 Other specified symptoms and signs involving the circulatory and respiratory systems: Secondary | ICD-10-CM | POA: Diagnosis not present

## 2023-02-26 DIAGNOSIS — H6120 Impacted cerumen, unspecified ear: Secondary | ICD-10-CM | POA: Diagnosis not present

## 2023-02-26 DIAGNOSIS — R319 Hematuria, unspecified: Secondary | ICD-10-CM | POA: Diagnosis not present

## 2023-02-27 DIAGNOSIS — R2689 Other abnormalities of gait and mobility: Secondary | ICD-10-CM | POA: Diagnosis not present

## 2023-03-04 DIAGNOSIS — B961 Klebsiella pneumoniae [K. pneumoniae] as the cause of diseases classified elsewhere: Secondary | ICD-10-CM | POA: Diagnosis not present

## 2023-03-04 DIAGNOSIS — N390 Urinary tract infection, site not specified: Secondary | ICD-10-CM | POA: Diagnosis not present

## 2023-03-20 ENCOUNTER — Ambulatory Visit (INDEPENDENT_AMBULATORY_CARE_PROVIDER_SITE_OTHER): Payer: Medicare Other

## 2023-03-20 ENCOUNTER — Encounter: Payer: Medicare Other | Admitting: Cardiovascular Disease

## 2023-03-20 DIAGNOSIS — I495 Sick sinus syndrome: Secondary | ICD-10-CM | POA: Diagnosis not present

## 2023-03-21 LAB — CUP PACEART REMOTE DEVICE CHECK
Battery Remaining Longevity: 12 mo
Battery Remaining Percentage: 11 %
Brady Statistic RA Percent Paced: 38 %
Brady Statistic RV Percent Paced: 12 %
Date Time Interrogation Session: 20241226031100
Implantable Lead Connection Status: 753985
Implantable Lead Connection Status: 753985
Implantable Lead Implant Date: 20151013
Implantable Lead Implant Date: 20151013
Implantable Lead Location: 753859
Implantable Lead Location: 753860
Implantable Lead Model: 4135
Implantable Lead Model: 4136
Implantable Lead Serial Number: 29480373
Implantable Lead Serial Number: 29608302
Implantable Pulse Generator Implant Date: 20151013
Lead Channel Impedance Value: 543 Ohm
Lead Channel Impedance Value: 949 Ohm
Lead Channel Setting Pacing Amplitude: 2 V
Lead Channel Setting Pacing Amplitude: 5 V
Lead Channel Setting Pacing Pulse Width: 1.5 ms
Lead Channel Setting Sensing Sensitivity: 4 mV
Pulse Gen Serial Number: 702951
Zone Setting Status: 755011

## 2023-03-23 DIAGNOSIS — N400 Enlarged prostate without lower urinary tract symptoms: Secondary | ICD-10-CM | POA: Diagnosis not present

## 2023-03-23 DIAGNOSIS — F0390 Unspecified dementia without behavioral disturbance: Secondary | ICD-10-CM | POA: Diagnosis not present

## 2023-03-23 DIAGNOSIS — E0800 Diabetes mellitus due to underlying condition with hyperosmolarity without nonketotic hyperglycemic-hyperosmolar coma (NKHHC): Secondary | ICD-10-CM | POA: Diagnosis not present

## 2023-03-23 DIAGNOSIS — G20B1 Parkinson's disease with dyskinesia, without mention of fluctuations: Secondary | ICD-10-CM | POA: Diagnosis not present

## 2023-03-23 DIAGNOSIS — E785 Hyperlipidemia, unspecified: Secondary | ICD-10-CM | POA: Diagnosis not present

## 2023-03-23 DIAGNOSIS — N3281 Overactive bladder: Secondary | ICD-10-CM | POA: Diagnosis not present

## 2023-03-23 DIAGNOSIS — E039 Hypothyroidism, unspecified: Secondary | ICD-10-CM | POA: Diagnosis not present

## 2023-03-23 DIAGNOSIS — F419 Anxiety disorder, unspecified: Secondary | ICD-10-CM | POA: Diagnosis not present

## 2023-03-23 DIAGNOSIS — I10 Essential (primary) hypertension: Secondary | ICD-10-CM | POA: Diagnosis not present

## 2023-03-24 DIAGNOSIS — E785 Hyperlipidemia, unspecified: Secondary | ICD-10-CM | POA: Diagnosis not present

## 2023-03-24 DIAGNOSIS — F419 Anxiety disorder, unspecified: Secondary | ICD-10-CM | POA: Diagnosis not present

## 2023-03-24 DIAGNOSIS — F0390 Unspecified dementia without behavioral disturbance: Secondary | ICD-10-CM | POA: Diagnosis not present

## 2023-03-24 DIAGNOSIS — E0800 Diabetes mellitus due to underlying condition with hyperosmolarity without nonketotic hyperglycemic-hyperosmolar coma (NKHHC): Secondary | ICD-10-CM | POA: Diagnosis not present

## 2023-03-24 DIAGNOSIS — I10 Essential (primary) hypertension: Secondary | ICD-10-CM | POA: Diagnosis not present

## 2023-03-24 DIAGNOSIS — G20B1 Parkinson's disease with dyskinesia, without mention of fluctuations: Secondary | ICD-10-CM | POA: Diagnosis not present

## 2023-04-25 NOTE — Addendum Note (Signed)
Addended by: Elease Etienne A on: 04/25/2023 11:18 AM   Modules accepted: Orders

## 2023-04-25 NOTE — Progress Notes (Signed)
 Remote pacemaker transmission.

## 2023-05-01 ENCOUNTER — Ambulatory Visit: Payer: Medicare Other | Attending: Cardiovascular Disease | Admitting: Cardiovascular Disease

## 2023-06-20 ENCOUNTER — Ambulatory Visit (INDEPENDENT_AMBULATORY_CARE_PROVIDER_SITE_OTHER): Payer: Medicare Other

## 2023-06-20 DIAGNOSIS — I495 Sick sinus syndrome: Secondary | ICD-10-CM | POA: Diagnosis not present

## 2023-06-25 LAB — CUP PACEART REMOTE DEVICE CHECK
Battery Remaining Longevity: 7 mo
Battery Remaining Percentage: 6 %
Brady Statistic RA Percent Paced: 36 %
Brady Statistic RV Percent Paced: 13 %
Date Time Interrogation Session: 20250328031000
Implantable Lead Connection Status: 753985
Implantable Lead Connection Status: 753985
Implantable Lead Implant Date: 20151013
Implantable Lead Implant Date: 20151013
Implantable Lead Location: 753859
Implantable Lead Location: 753860
Implantable Lead Model: 4135
Implantable Lead Model: 4136
Implantable Lead Serial Number: 29480373
Implantable Lead Serial Number: 29608302
Implantable Pulse Generator Implant Date: 20151013
Lead Channel Impedance Value: 1044 Ohm
Lead Channel Impedance Value: 559 Ohm
Lead Channel Setting Pacing Amplitude: 2 V
Lead Channel Setting Pacing Amplitude: 5 V
Lead Channel Setting Pacing Pulse Width: 1.5 ms
Lead Channel Setting Sensing Sensitivity: 4 mV
Pulse Gen Serial Number: 702951
Zone Setting Status: 755011

## 2023-06-29 ENCOUNTER — Encounter: Payer: Self-pay | Admitting: Cardiovascular Disease

## 2023-07-17 ENCOUNTER — Ambulatory Visit: Payer: Medicare Other | Attending: Cardiovascular Disease | Admitting: Cardiovascular Disease

## 2023-07-17 ENCOUNTER — Encounter: Payer: Self-pay | Admitting: Cardiovascular Disease

## 2023-07-17 VITALS — BP 118/60 | HR 68 | Ht 69.0 in | Wt 131.6 lb

## 2023-07-17 DIAGNOSIS — I495 Sick sinus syndrome: Secondary | ICD-10-CM

## 2023-07-17 DIAGNOSIS — G20A2 Parkinson's disease without dyskinesia, with fluctuations: Secondary | ICD-10-CM

## 2023-07-17 DIAGNOSIS — Z950 Presence of cardiac pacemaker: Secondary | ICD-10-CM

## 2023-07-17 DIAGNOSIS — D649 Anemia, unspecified: Secondary | ICD-10-CM

## 2023-07-17 DIAGNOSIS — I480 Paroxysmal atrial fibrillation: Secondary | ICD-10-CM | POA: Diagnosis not present

## 2023-07-17 DIAGNOSIS — G903 Multi-system degeneration of the autonomic nervous system: Secondary | ICD-10-CM

## 2023-07-17 NOTE — Patient Instructions (Signed)
 Medication Instructions:  No changes *If you need a refill on your cardiac medications before your next appointment, please call your pharmacy*  Follow-Up: At [facility], you and your health needs are our priority.  As part of our continuing mission to provide you with exceptional heart care, our providers are all part of one team.  This team includes your primary Cardiologist (physician) and Advanced Practice Providers or APPs (Physician Assistants and Nurse Practitioners) who all work together to provide you with the care you need, when you need it.  Your next appointment:   6 month(s)  Provider:   Luana Rumple, MD     We recommend signing up for the patient portal called "MyChart".  Sign up information is provided on this After Visit Summary.  MyChart is used to connect with patients for Virtual Visits (Telemedicine).  Patients are able to view lab/test results, encounter notes, upcoming appointments, etc.  Non-urgent messages can be sent to your provider as well.   To learn more about what you can do with MyChart, go to [email or web address].        1st Floor: - Lobby - Registration  - Pharmacy  - Lab - Cafe  2nd Floor: - PV Lab - Diagnostic Testing (echo, CT, nuclear med)  3rd Floor: - Vacant  4th Floor: - TCTS (cardiothoracic surgery) - AFib Clinic - Structural Heart Clinic - Vascular Surgery  - Vascular Ultrasound  5th Floor: - HeartCare Cardiology (general and EP) - Clinical Pharmacy for coumadin, hypertension, lipid, weight-loss medications, and med management appointments    Valet parking services will be available as well.

## 2023-07-17 NOTE — Progress Notes (Signed)
 Patient ID: Stephen Robbins, male   DOB: 08-Mar-1936, 88 y.o.   MRN: [id number]    Cardiology Office Note    Date:  07/17/2023   ID:  Stephen Robbins, DOB [birth year], MRN [id number]  PCP:  Hoover Luz, MD  Cardiologist:   Luana Rumple, MD   Chief Complaint  Patient presents with   Pacemaker Check    History of Present Illness:  Stephen Robbins is a 88 y.o. male with asymptomatic paroxysmal atrial fibrillation detected on pacemaker downloads, sinus node dysfunction with chronotropic incompetence, Parkinson's disease, diabetes mellitus, asymptomatic brief nonsustained VT detected on pacemaker.  Stephen Robbins's functional status remains limited by Parkinson's disease and orthostatic hypotension.  He receives midodrine  to compensate for the hypotension.  He remains unsteady on his feet and can only stand with assistance.  He has not had serious injuries but has had at least 1 recent fall.  He denies syncope.  He has not been troubled by palpitations.  He denies orthopnea, PND, leg edema, new focal neurological complaints or claudication.  In the office today he has a lot of involuntary movements.  When he was here a year ago his hemoglobin was 8.7, normocytic/mildly hypochromic.  I cannot find a repeat check of his hemoglobin since then.  He still looks pale today.  He has lost weight and is now borderline underweight.  He reports that actually he has gained a little weight back recently.  His BMI is 19.  His device is a [employer], broadcasting/film/video EL L121 (not MRI conditional) dual-chamber pacemaker implanted in 2015.   Pacemaker check in the office today shows normal function.  Presenting rhythm is atrial paced, ventricular sensed.  Estimated generator longevity is 8 months.  All lead parameters are stable.  His device shows 36% atrial pacing and 13% ventricular pacing.  He had a 2-second episode of very rapid paroxysmal atrial tachycardia last December but has not had any atrial fibrillation since his last  device download.  His device has shown occasional "VT" 2 episodes since last July, most recently 1 on 05/28/2023 which is clearly due to over sensing "noise" on his ventricular lead, lasting only a couple of seconds.  This is the part of the fact that his ventricular sensing is at a relatively high setting since in the past years he had ventricular over sensing that could be reproduced with isometric chest muscle exercises.  His true R waves show excellent sensed voltage. Over the years his burden of atrial fibrillation has been extremely low, under 1%.  Heart rate histograms are blunted but he is extremely sedentary.  His most recent echocardiogram performed September 2023 showed normal left ventricular systolic function with EF 55 to 60% and no significant valvular abnormalities.  The left atrium was normal in size.  Past Medical History:  Diagnosis Date   Arthritis    "joints; a little" (01/04/2014)   Asymptomatic bilateral carotid artery stenosis    per duplex  05-15-2012  left >39%/   right 40-59%   Basal cell carcinoma    nose   Borderline diabetes    BPH (benign prostatic hypertrophy) with urinary obstruction    Bradycardia    Bronchial pneumonia 1958   Chronotropic incompetence with sinus node dysfunction    Compressed cervical disc    Compression of lumbar vertebra (HCC)    L4 -- L5   Dizzy    Dysmetabolic syndrome    Fatigue    Frequency of urination    GERD (  gastroesophageal reflux disease)    occasional   Hemorrhoids    Hyperlipidemia    Hypothyroidism    Nocturia    NSVT (nonsustained ventricular tachycardia) [facility])    cardiologist-  dr Carrin Vannostrand   Pacemaker    Urgency of urination    Wears glasses     Past Surgical History:  Procedure Laterality Date   BASAL CELL CARCINOMA EXCISION     nose   CLOSED REDUCTION NASAL FRACTURE  09-01-2007   CYSTOSCOPY N/A 12/28/2012   Procedure: CYSTOSCOPY;  Surgeon: Livingston Rigg, MD;  Location: [facility];   Service: Urology;  Laterality: N/A;   EXERCISE TOLERENCE TEST  12-03-2012  DR CROITURO   CHRONOTROPIC INCOMPETENCE/ NORMAL RESTING BP W/ APPROPRIATE RESPONSE/ NO CHEST PAIN/ NO ST CHANGES FROM BASELINE   INSERT / REPLACE / REMOVE PACEMAKER  01/04/2014   [employer] model L121 serial number [id number]   LACERATION REPAIR Right 1978   middle finger   NEUROPLASTY / TRANSPOSITION ULNAR NERVE AT ELBOW Left 02-09-2010   PERMANENT PACEMAKER INSERTION N/A 01/04/2014   Procedure: PERMANENT PACEMAKER INSERTION;  Surgeon: Luana Rumple, MD;  Location: [facility] CATH LAB;  Service: Cardiovascular;  Laterality: N/A;   SKIN BIOPSY     scc   TONSILLECTOMY AND ADENOIDECTOMY  1944   TRANSURETHRAL INCISION OF PROSTATE N/A 12/28/2012   Procedure: TRANSURETHRAL INCISION OF THE PROSTATE (TUIP);  Surgeon: Livingston Rigg, MD;  Location: [facility];  Service: Urology;  Laterality: N/A;   ULNAR NERVE TRANSPOSITION      Current Medications: Outpatient Medications Prior to Visit  Medication Sig Dispense Refill   buPROPion  ER (WELLBUTRIN  SR) 100 MG 12 hr tablet Take 100 mg by mouth daily.     carbidopa -levodopa  (SINEMET  IR) 25-100 MG tablet Take 2 tablets by mouth 3 (three) times daily. 540 tablet 0   GEMTESA 75 MG TABS Take 75 mg by mouth daily.     levothyroxine  (SYNTHROID ) 100 MCG tablet Take 100 mcg by mouth daily.     midodrine  (PROAMATINE ) 2.5 MG tablet Take 1 tablet (2.5 mg total) by mouth 3 (three) times daily with meals. (Patient taking differently: Take 2.5 mg by mouth 2 (two) times daily with a meal.) 270 tablet 1   psyllium (REGULOID) 0.52 g capsule Take 0.52 g by mouth daily.     simvastatin  (ZOCOR ) 40 MG tablet Take 1 tablet (40 mg total) by mouth at bedtime. 90 tablet 1   tamsulosin  (FLOMAX ) 0.4 MG CAPS capsule Take 0.4 mg by mouth at bedtime.     ALPRAZolam  (XANAX ) 0.25 MG tablet Take 0.25 mg by mouth at bedtime as needed. (Patient not taking: Reported on 07/17/2023)     Blood  Pressure KIT Take blood pressure twice weekly. (Patient not taking: Reported on 07/17/2023) 1 kit 0   buPROPion  (WELLBUTRIN ) 75 MG tablet TAKE 1 TABLET(75 MG) BY MOUTH TWICE DAILY 180 tablet 1   Cobalamin Combinations (B12 FOLATE PO) Take 2 tablets by mouth daily at 12 noon. (Patient not taking: Reported on 07/17/2023)     ibuprofen  (ADVIL ) 400 MG tablet Take 400 mg by mouth every 6 (six) hours as needed. (Patient not taking: Reported on 07/17/2023)     levothyroxine  (SYNTHROID ) 75 MCG tablet TAKE 1 TABLET(75 MCG) BY MOUTH DAILY BEFORE AND BREAKFAST (Patient not taking: Reported on 07/17/2023) 90 tablet 1   Melatonin 10 MG TABS Take 10 mg by mouth at bedtime.     metFORMIN  (GLUCOPHAGE ) 1000 MG tablet TAKE  1 TABLET(1000 MG) BY MOUTH TWICE DAILY WITH A MEAL 60 tablet 0   mirtazapine (REMERON) 15 MG tablet Take 15 mg by mouth at bedtime.     nitrofurantoin, macrocrystal-monohydrate, (MACROBID) 100 MG capsule Take 100 mg by mouth 2 (two) times daily. For seven days (Patient not taking: Reported on 12/26/2022)     saccharomyces boulardii (FLORASTOR) 250 MG capsule Take 250 mg by mouth 2 (two) times daily. For seven days     sertraline  (ZOLOFT ) 100 MG tablet Take 1 tablet (100 mg total) by mouth daily. 90 tablet 1   traZODone (DESYREL) 50 MG tablet Take 50 mg by mouth at bedtime.     No facility-administered medications prior to visit.     Allergies:   Covid-19 (mrna) vaccine, Covid-19 mrna vacc (moderna), and Penicillins   Family History:  The patient's family history includes Heart Problems in his brother; Lung cancer in his mother; Parkinson's disease in his brother; Stroke in his father.     PHYSICAL EXAM:   VS:  BP 118/60 (BP Location: Left Arm, Patient Position: Sitting, Cuff Size: Normal)   Pulse 68   Ht 5\' 9"  (1.753 m)   Wt 131 lb 9.6 oz (59.7 kg)   SpO2 95%   BMI 19.43 kg/m      General: Alert, oriented x3, no distress, appears elderly, frail, pale. Head: no evidence of trauma, PERRL,  EOMI, no exophtalmos or lid lag, no myxedema, no xanthelasma; normal ears, nose and oropharynx Neck: normal jugular venous pulsations and no hepatojugular reflux; brisk carotid pulses without delay and no carotid bruits Chest: clear to auscultation, no signs of consolidation by percussion or palpation, normal fremitus, symmetrical and full respiratory excursions Cardiovascular: normal position and quality of the apical impulse, regular rhythm, normal first and second heart sounds, no murmurs, rubs or gallops Abdomen: no tenderness or distention, no masses by palpation, no abnormal pulsatility or arterial bruits, normal bowel sounds, no hepatosplenomegaly Extremities: no clubbing, cyanosis or edema; 2+ radial, ulnar and brachial pulses bilaterally; 2+ right femoral, posterior tibial and dorsalis pedis pulses; 2+ left femoral, posterior tibial and dorsalis pedis pulses; no subclavian or femoral bruits Neurological: He has repeated involuntary dyskinetic motion of his head and extremities and torso  Psych: Normal mood and affect    Wt Readings from Last 3 Encounters:  07/17/23 131 lb 9.6 oz (59.7 kg)  12/26/22 145 lb (65.8 kg)  10/16/22 146 lb (66.2 kg)      Studies/Labs Reviewed:   EKG:    EKG Interpretation Date/Time:  Thursday July 17 2023 11:42:20 EDT Ventricular Rate:  68 PR Interval:  160 QRS Duration:  144 QT Interval:  442 QTC Calculation: 469 R Axis:   -65  Text Interpretation: Normal sinus rhythm Left axis deviation Right bundle branch block Septal infarct , age undetermined When compared with ECG of 16-Oct-2022 13:58, Sinus rhythm has replaced Electronic ventricular pacemaker Vent. rate has decreased BY  36 BPM Confirmed by Robertlee Rogacki ([id number]) on 07/17/2023 11:52:50 AM        Recent Labs: 10/16/2022: BUN 17; Creatinine, Ser 0.78; Hemoglobin 8.7; Platelets 262; Potassium 4.6; Sodium 140   Lipid Panel    Component Value Date/Time   CHOL 202 (H) 10/17/2020 1456    TRIG 146.0 10/17/2020 1456   TRIG 83 02/20/2006 0838   HDL 39.50 10/17/2020 1456   CHOLHDL 5 10/17/2020 1456   VLDL 29.2 10/17/2020 1456   LDLCALC 134 (H) 10/17/2020 1456   LDLCALC 87 12/07/2019 1520  ASSESSMENT:    1. Paroxysmal atrial fibrillation (HCC)   2. Pacemaker   3. Parkinson's disease without dyskinesia, with fluctuating manifestations (HCC)   4. SSS (sick sinus syndrome) (HCC)   5. Neurogenic orthostatic hypotension (HCC)   6. Anemia, unspecified type      PLAN:  In order of problems listed above:  Atrial fibrillation: Arrhythmia was never symptomatic and had very low problems.  None has been detected recently.  We have stopped his anticoagulation since the risk of injury and bleeding exceeds the risk of embolic events. CHADSVasc score is only 46 (age, DM).  Antiarrhythmics are not justified. SSS: Heart histograms are blunted but this is appropriate for his very sedentary lifestyle. PPM: Continue remote downloads every 3 months and yearly office visits.  Her sensing is purposely set hide for millivolts and there is only been a couple of episodes of ventricular over sensing since his last appointment.  Anticipate need for generator change out towards the end of this year.  We briefly discussed how we will monitor his device throughout the year and the typical plan for generator change out. Orthostatic hypotension: Reason we well compensated on midodrine , as far as I can tell.  Due to Parkinson's related autonomic dysfunction. Anemia: He looks pale today, as it did at his last appointment.  I am not sure if there has been an effort to evaluate the cause for this or treated.  Will reach out to his primary care provider.  We did not recheck his hemoglobin today.   Medication Adjustments/Labs and Tests Ordered: Current medicines are reviewed at length with the patient today.  Concerns regarding medicines are outlined above.  Medication changes, Labs and Tests ordered today are  listed in the Patient Instructions below. Patient Instructions  Medication Instructions:  No changes *If you need a refill on your cardiac medications before your next appointment, please call your pharmacy*  Follow-Up: At [facility], you and your health needs are our priority.  As part of our continuing mission to provide you with exceptional heart care, our providers are all part of one team.  This team includes your primary Cardiologist (physician) and Advanced Practice Providers or APPs (Physician Assistants and Nurse Practitioners) who all work together to provide you with the care you need, when you need it.  Your next appointment:   6 month(s)  Provider:   Luana Rumple, MD     We recommend signing up for the patient portal called "MyChart".  Sign up information is provided on this After Visit Summary.  MyChart is used to connect with patients for Virtual Visits (Telemedicine).  Patients are able to view lab/test results, encounter notes, upcoming appointments, etc.  Non-urgent messages can be sent to your provider as well.   To learn more about what you can do with MyChart, go to [email or web address].        1st Floor: - Lobby - Registration  - Pharmacy  - Lab - Cafe  2nd Floor: - PV Lab - Diagnostic Testing (echo, CT, nuclear med)  3rd Floor: - Vacant  4th Floor: - TCTS (cardiothoracic surgery) - AFib Clinic - Structural Heart Clinic - Vascular Surgery  - Vascular Ultrasound  5th Floor: - HeartCare Cardiology (general and EP) - Clinical Pharmacy for coumadin, hypertension, lipid, weight-loss medications, and med management appointments    Valet parking services will be available as well.      Signed, Luana Rumple, MD  07/17/2023 7:14 PM    [facility] [address] [place], [place], [place]  [address] Phone: [phone]; Fax: [phone]

## 2023-07-28 NOTE — Progress Notes (Unsigned)
 Assessment/Plan:   1.  Parkinsons Disease  -decrease carbidopa /levodopa  25/100, 1.5 tablets 3 times per day.  Wrote the time in which medication should be given.  Decreased medication primarily because of fairly significant dyskinesia today and the fact that he is really not walking with the exception of when physical therapy is right with him.  We discussed timing again with protein and changed timing with [name].  Also discussed not to take it with his protein supplement shakes.  -we have asked nursing facility to send us  times that they are actually giving him medication but they have not done that the last several visits.  -Encouraged exercise, both physical and mental.  He actually looked pretty good today, but was frustrated over the fact that he cannot live independently.  2.  Depression  -On sertraline , 100 mg daily.    -pcp managing  3.  Weight loss  - He has lost quite a bit of weight since our last visit.  He was previously doing better with protein supplements.  Primary care following and managing.  4.  Visual hallucinations  -Resolved after treatment for UTI and currently without any visual hallucinations.  5.  Neurogenic Orthostatic Hypotension  -Primary care is now managing.  He was on midodrine , 2.5 mg 3 times per day with meals but SNF changed it back to bid prn.  I don't think that they are likely checking his BP each time they are distributing meds and wonder what time the 2nd dose is given (should be with meals).  We are now leaving at the primary care to manage, given that they are in the facility more than us .  His BP was low today and it was unclear if it was given today.    Subjective:   Stephen Robbins was seen today in follow up for Parkinsons disease.  My previous records were reviewed prior to todays visit as well as outside records available to me. Pt with caregiver who supplements hx.   last visit, we asked the facility to make sure that they sent  times they were giving the levodopa  and unfortunately they didn't do that.  We were also concerned about his midodrine .  It was changed from 2.5 mg 3 times per day to 2.5 mg twice per day as needed, but it was unclear if they were checking his blood pressure each time they were distributing medication, especially 2 or 3 times per day.  His BP is low today.  He reports that lightheadedness/dizziness is better to me today.   He did note to cardiology about the unsteadiness and dizziness, but I am not sure that they were aware that the midodrine  was not being given faithfully.  His cardiologist did note that he was very pale and reached out to his primary care physician to evaluate this.  He did have a fall since our last visit.  He fx his clavicle and went to the ER for this on 4/29.  He has dyskinesia and notes that it can be bothersome.  He only walks when PT is with him.    Current movement disorder medications: Carbidopa /levodopa  25/100, 2 tablets 3 times per day (states taking breakfast, lunch dinner) Gabapentin , 300 mg twice per day (he is off of that now) Sertraline , 100 mg daily   ALLERGIES:   Allergies  Allergen Reactions   Covid-19 (Mrna) Vaccine     "Couldn't move". No rash, no itching   Covid-19 Mrna Vacc (Moderna)     "  Couldn't move". No rash, no itching    Penicillins Other (See Comments)    Unknown childhood reaction    CURRENT MEDICATIONS:  Outpatient Encounter Medications as of 07/29/2023  Medication Sig   buPROPion  ER (WELLBUTRIN  SR) 100 MG 12 hr tablet Take 100 mg by mouth daily.   carbidopa -levodopa  (SINEMET  IR) 25-100 MG tablet Take 2 tablets by mouth 3 (three) times daily.   GEMTESA 75 MG TABS Take 75 mg by mouth daily.   levothyroxine  (SYNTHROID ) 100 MCG tablet Take 100 mcg by mouth daily.   Melatonin 10 MG TABS Take 10 mg by mouth at bedtime.   metFORMIN  (GLUCOPHAGE ) 1000 MG tablet TAKE 1 TABLET(1000 MG) BY MOUTH TWICE DAILY WITH A MEAL   midodrine  (PROAMATINE ) 2.5 MG  tablet Take 1 tablet (2.5 mg total) by mouth 3 (three) times daily with meals. (Patient taking differently: Take 2.5 mg by mouth 2 (two) times daily with a meal.)   mirtazapine (REMERON) 15 MG tablet Take 15 mg by mouth at bedtime.   nitrofurantoin, macrocrystal-monohydrate, (MACROBID) 100 MG capsule Take 100 mg by mouth 2 (two) times daily. For seven days   psyllium (REGULOID) 0.52 g capsule Take 0.52 g by mouth daily.   saccharomyces boulardii (FLORASTOR) 250 MG capsule Take 250 mg by mouth 2 (two) times daily. For seven days   sertraline  (ZOLOFT ) 100 MG tablet Take 1 tablet (100 mg total) by mouth daily.   simvastatin  (ZOCOR ) 40 MG tablet Take 1 tablet (40 mg total) by mouth at bedtime.   tamsulosin  (FLOMAX ) 0.4 MG CAPS capsule Take 0.4 mg by mouth at bedtime.   traZODone (DESYREL) 50 MG tablet Take 50 mg by mouth at bedtime.   [DISCONTINUED] ALPRAZolam  (XANAX ) 0.25 MG tablet Take 0.25 mg by mouth at bedtime as needed. (Patient not taking: Reported on 07/29/2023)   [DISCONTINUED] Blood Pressure KIT Take blood pressure twice weekly. (Patient not taking: Reported on 07/17/2023)   [DISCONTINUED] Cobalamin Combinations (B12 FOLATE PO) Take 2 tablets by mouth daily at 12 noon. (Patient not taking: Reported on 07/17/2023)   [DISCONTINUED] ibuprofen  (ADVIL ) 400 MG tablet Take 400 mg by mouth every 6 (six) hours as needed. (Patient not taking: Reported on 07/29/2023)   No facility-administered encounter medications on file as of 07/29/2023.    Objective:   PHYSICAL EXAMINATION:    VITALS:   Vitals:   07/29/23 1419  BP: (!) 100/50  Pulse: 89  SpO2: 98%  Height: 5\' 9"  (1.753 m)    Wt Readings from Last 3 Encounters:  07/17/23 131 lb 9.6 oz (59.7 kg)  12/26/22 145 lb (65.8 kg)  10/16/22 146 lb (66.2 kg)     GEN:  The patient appears stated age and is in NAD.  HEENT:  Normocephalic, atraumatic.   The mucous membranes are moist. The superficial temporal arteries are without ropiness or  tenderness. CV:  RRR Lungs:  CTAB Neck/HEME:  There are no carotid bruits bilaterally.  Neurological examination:  Orientation: The patient is alert and oriented x3.  While sometimes he appears somewhat confused, he is able to recognize this examiner.  He asks detailed questions about my children.  He is able to tell me the exact month and date that he was first seen at my office, which was over a decade ago. Cranial nerves: There is good facial symmetry with facial hypomimia. The speech is fluent and clear but significantly hypophonic. Soft palate rises symmetrically and there is no tongue deviation. Hearing is intact to conversational tone. Sensation:  Sensation is intact to light touch throughout Motor: Strength is at least antigravity x4.  He does have a sling on due to recent clavicle fracture.  Movement examination: Tone: There is nl tone in the UE/LE Abnormal movements: At least moderate dyskinesia, both axial and more distally, arms and legs. Coordination:  There is no decremation with RAM's Gait and Station: Not tested today.  I have reviewed and interpreted the following labs independently    Chemistry      Component Value Date/Time   NA 140 10/16/2022 1507   K 4.6 10/16/2022 1507   CL 102 10/16/2022 1507   CO2 22 10/16/2022 1507   BUN 17 10/16/2022 1507   CREATININE 0.78 10/16/2022 1507   CREATININE 0.93 12/07/2019 1520      Component Value Date/Time   CALCIUM 9.5 10/16/2022 1507   ALKPHOS 75 12/17/2021 1529   AST 20 12/17/2021 1529   ALT 11 12/17/2021 1529   BILITOT 0.4 12/17/2021 1529       Lab Results  Component Value Date   WBC 7.7 10/16/2022   HGB 8.7 (L) 10/16/2022   HCT 27.8 (L) 10/16/2022   MCV 89 10/16/2022   PLT 262 10/16/2022    Lab Results  Component Value Date   TSH 3.879 11/21/2020     Total time spent on today's visit was 30 minutes, including both face-to-face time and nonface-to-face time.  Time included that spent on review of  records (prior notes available to me/labs/imaging if pertinent), discussing treatment and goals, answering patient's questions and coordinating care.  Cc:  Hoover Luz, MD

## 2023-07-29 ENCOUNTER — Ambulatory Visit (INDEPENDENT_AMBULATORY_CARE_PROVIDER_SITE_OTHER): Payer: Medicare Other | Admitting: Neurology

## 2023-07-29 ENCOUNTER — Encounter: Payer: Self-pay | Admitting: Neurology

## 2023-07-29 VITALS — BP 100/50 | HR 89 | Ht 69.0 in

## 2023-07-29 DIAGNOSIS — G20A1 Parkinson's disease without dyskinesia, without mention of fluctuations: Secondary | ICD-10-CM | POA: Diagnosis not present

## 2023-07-29 DIAGNOSIS — R634 Abnormal weight loss: Secondary | ICD-10-CM | POA: Diagnosis not present

## 2023-07-29 DIAGNOSIS — G20B2 Parkinson's disease with dyskinesia, with fluctuations: Secondary | ICD-10-CM

## 2023-07-29 MED ORDER — CARBIDOPA-LEVODOPA 25-100 MG PO TABS: 1.5000 | ORAL_TABLET | Freq: Three times a day (TID) | ORAL | 1 refills | Status: DC

## 2023-07-30 NOTE — Progress Notes (Signed)
 Remote pacemaker transmission.

## 2023-07-30 NOTE — Addendum Note (Signed)
 Addended by: Lott Rouleau A on: 07/30/2023 09:24 AM   Modules accepted: Orders

## 2023-09-17 ENCOUNTER — Telehealth: Payer: Self-pay | Admitting: Emergency Medicine

## 2023-09-17 NOTE — Telephone Encounter (Signed)
 Received BS advisory that this patient needs a Gen Change. Relayed this information to Dr Francyne.  Dr Francyne responded:  Not urgent. He is not pacemaker dependent. He was supposed to come back and see me in October. I think we can just keep it that way.   Recall for October 2025

## 2023-09-22 ENCOUNTER — Telehealth: Payer: Self-pay | Admitting: *Deleted

## 2023-09-22 ENCOUNTER — Ambulatory Visit: Payer: Self-pay | Admitting: Cardiovascular Disease

## 2023-09-22 LAB — CUP PACEART REMOTE DEVICE CHECK
Battery Remaining Longevity: 5 mo
Battery Remaining Percentage: 4 %
Brady Statistic RA Percent Paced: 29 %
Brady Statistic RV Percent Paced: 10 %
Date Time Interrogation Session: 20250630031100
Implantable Lead Connection Status: 753985
Implantable Lead Connection Status: 753985
Implantable Lead Implant Date: 20151013
Implantable Lead Implant Date: 20151013
Implantable Lead Location: 753859
Implantable Lead Location: 753860
Implantable Lead Model: 4135
Implantable Lead Model: 4136
Implantable Lead Serial Number: 29480373
Implantable Lead Serial Number: 29608302
Implantable Pulse Generator Implant Date: 20151013
Lead Channel Impedance Value: 1210 Ohm
Lead Channel Impedance Value: 523 Ohm
Lead Channel Setting Pacing Amplitude: 2 V
Lead Channel Setting Pacing Amplitude: 5 V
Lead Channel Setting Pacing Pulse Width: 1.5 ms
Lead Channel Setting Sensing Sensitivity: 4 mV
Pulse Gen Serial Number: 702951
Zone Setting Status: 755011

## 2023-09-22 NOTE — Telephone Encounter (Signed)
 Called and spoke with patient to notify them that their device battery had approximately 5 months left on it and that we would be scheduling him for monthly remote checks going forward  Patient verbalized understanding  Monthly battery checks scheduled  EP scheduling messaged to get patient appointment at beginning of Oct. per Dr. Francyne (see 09/17/23 phone note)

## 2023-10-22 ENCOUNTER — Ambulatory Visit

## 2023-10-24 LAB — CUP PACEART REMOTE DEVICE CHECK
Battery Remaining Longevity: 3 mo
Battery Remaining Percentage: 2 %
Brady Statistic RA Percent Paced: 31 %
Brady Statistic RV Percent Paced: 11 %
Date Time Interrogation Session: 20250731031100
Implantable Lead Connection Status: 753985
Implantable Lead Connection Status: 753985
Implantable Lead Implant Date: 20151013
Implantable Lead Implant Date: 20151013
Implantable Lead Location: 753859
Implantable Lead Location: 753860
Implantable Lead Model: 4135
Implantable Lead Model: 4136
Implantable Lead Serial Number: 29480373
Implantable Lead Serial Number: 29608302
Implantable Pulse Generator Implant Date: 20151013
Lead Channel Impedance Value: 1235 Ohm
Lead Channel Impedance Value: 549 Ohm
Lead Channel Setting Pacing Amplitude: 2 V
Lead Channel Setting Pacing Amplitude: 5 V
Lead Channel Setting Pacing Pulse Width: 1.5 ms
Lead Channel Setting Sensing Sensitivity: 4 mV
Pulse Gen Serial Number: 702951
Zone Setting Status: 755011

## 2023-10-28 ENCOUNTER — Ambulatory Visit: Payer: Self-pay | Admitting: Cardiovascular Disease

## 2023-11-24 ENCOUNTER — Ambulatory Visit

## 2023-11-24 ENCOUNTER — Encounter

## 2023-11-26 LAB — CUP PACEART REMOTE DEVICE CHECK
Battery Remaining Longevity: 3 mo
Battery Remaining Percentage: 2 %
Brady Statistic RA Percent Paced: 31 %
Brady Statistic RV Percent Paced: 11 %
Date Time Interrogation Session: 20250829032100
Implantable Lead Connection Status: 753985
Implantable Lead Connection Status: 753985
Implantable Lead Implant Date: 20151013
Implantable Lead Implant Date: 20151013
Implantable Lead Location: 753859
Implantable Lead Location: 753860
Implantable Lead Model: 4135
Implantable Lead Model: 4136
Implantable Lead Serial Number: 29480373
Implantable Lead Serial Number: 29608302
Implantable Pulse Generator Implant Date: 20151013
Lead Channel Impedance Value: 1328 Ohm
Lead Channel Impedance Value: 537 Ohm
Lead Channel Setting Pacing Amplitude: 2 V
Lead Channel Setting Pacing Amplitude: 5 V
Lead Channel Setting Pacing Pulse Width: 1.5 ms
Lead Channel Setting Sensing Sensitivity: 4 mV
Pulse Gen Serial Number: 702951
Zone Setting Status: 755011

## 2023-12-07 ENCOUNTER — Ambulatory Visit: Payer: Self-pay | Admitting: Cardiovascular Disease

## 2023-12-22 ENCOUNTER — Encounter

## 2023-12-24 DEATH — deceased

## 2023-12-25 ENCOUNTER — Encounter

## 2023-12-25 ENCOUNTER — Ambulatory Visit: Attending: Cardiovascular Disease | Admitting: Cardiovascular Disease

## 2023-12-26 ENCOUNTER — Encounter: Payer: Self-pay | Admitting: Cardiovascular Disease

## 2023-12-30 ENCOUNTER — Telehealth: Payer: Self-pay

## 2023-12-30 NOTE — Telephone Encounter (Signed)
 Outreach made to Pt's niece.  Per niece Pt passed away beginning of last month.  Stephen Robbins has Pt death as 01/08/24.    Will send to HIM.

## 2024-01-22 ENCOUNTER — Encounter

## 2024-01-26 ENCOUNTER — Encounter

## 2024-02-10 ENCOUNTER — Ambulatory Visit: Admitting: Neurology

## 2024-02-23 ENCOUNTER — Encounter

## 2024-02-26 ENCOUNTER — Encounter

## 2024-03-22 ENCOUNTER — Encounter

## 2024-03-29 ENCOUNTER — Encounter

## 2024-04-29 ENCOUNTER — Encounter
